# Patient Record
Sex: Female | Born: 1955 | Race: White | Hispanic: No | Smoking: Never smoker
Health system: Southern US, Community
[De-identification: ages and names within clinical notes are randomized; demographics above are authoritative.]

## PROBLEM LIST (undated history)

## (undated) ENCOUNTER — Emergency Department (HOSPITAL_COMMUNITY): Discharge status: Absent without leave | Payer: PPO

## (undated) DIAGNOSIS — I4891 Unspecified atrial fibrillation: Secondary | ICD-10-CM

## (undated) DIAGNOSIS — E039 Hypothyroidism, unspecified: Secondary | ICD-10-CM

## (undated) DIAGNOSIS — M1990 Unspecified osteoarthritis, unspecified site: Secondary | ICD-10-CM

## (undated) DIAGNOSIS — R609 Edema, unspecified: Secondary | ICD-10-CM

## (undated) DIAGNOSIS — F32A Depression, unspecified: Secondary | ICD-10-CM

## (undated) DIAGNOSIS — C73 Malignant neoplasm of thyroid gland: Secondary | ICD-10-CM

## (undated) DIAGNOSIS — K219 Gastro-esophageal reflux disease without esophagitis: Secondary | ICD-10-CM

## (undated) DIAGNOSIS — F419 Anxiety disorder, unspecified: Secondary | ICD-10-CM

## (undated) DIAGNOSIS — K7460 Unspecified cirrhosis of liver: Secondary | ICD-10-CM

## (undated) DIAGNOSIS — E119 Type 2 diabetes mellitus without complications: Secondary | ICD-10-CM

## (undated) DIAGNOSIS — I10 Essential (primary) hypertension: Secondary | ICD-10-CM

## (undated) DIAGNOSIS — J449 Chronic obstructive pulmonary disease, unspecified: Secondary | ICD-10-CM

## (undated) DIAGNOSIS — I2510 Atherosclerotic heart disease of native coronary artery without angina pectoris: Secondary | ICD-10-CM

## (undated) DIAGNOSIS — G4730 Sleep apnea, unspecified: Secondary | ICD-10-CM

## (undated) DIAGNOSIS — D649 Anemia, unspecified: Secondary | ICD-10-CM

## (undated) DIAGNOSIS — N3281 Overactive bladder: Secondary | ICD-10-CM

## (undated) DIAGNOSIS — F329 Major depressive disorder, single episode, unspecified: Secondary | ICD-10-CM

## (undated) HISTORY — DX: Major depressive disorder, single episode, unspecified: F32.9

## (undated) HISTORY — DX: Unspecified atrial fibrillation: I48.91

## (undated) HISTORY — DX: Unspecified cirrhosis of liver: K74.60

## (undated) HISTORY — DX: Essential (primary) hypertension: I10

## (undated) HISTORY — PX: FOOT SURGERY: SHX648

## (undated) HISTORY — DX: Depression, unspecified: F32.A

## (undated) HISTORY — DX: Malignant neoplasm of thyroid gland: C73

## (undated) HISTORY — DX: Morbid (severe) obesity due to excess calories: E66.01

## (undated) HISTORY — DX: Gastro-esophageal reflux disease without esophagitis: K21.9

## (undated) HISTORY — PX: OTHER SURGICAL HISTORY: SHX169

## (undated) HISTORY — DX: Edema, unspecified: R60.9

## (undated) HISTORY — DX: Type 2 diabetes mellitus without complications: E11.9

## (undated) HISTORY — DX: Chronic obstructive pulmonary disease, unspecified: J44.9

---
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
## 1983-06-21 HISTORY — PX: ABDOMINAL HYSTERECTOMY: SHX81

## 1994-06-20 HISTORY — PX: CHOLECYSTECTOMY: SHX55

## 1998-06-20 HISTORY — PX: FOOT SURGERY: SHX648

## 1999-03-01 ENCOUNTER — Ambulatory Visit (HOSPITAL_BASED_OUTPATIENT_CLINIC_OR_DEPARTMENT_OTHER): Admission: RE | Admit: 1999-03-01 | Discharge: 1999-03-01 | Payer: Self-pay | Admitting: Orthopedic Surgery

## 2000-02-07 ENCOUNTER — Encounter: Admission: RE | Admit: 2000-02-07 | Discharge: 2000-05-07 | Payer: Self-pay | Admitting: Orthopedic Surgery

## 2000-07-04 ENCOUNTER — Encounter: Admission: RE | Admit: 2000-07-04 | Discharge: 2000-10-02 | Payer: Self-pay | Admitting: Orthopedic Surgery

## 2000-07-14 ENCOUNTER — Observation Stay (HOSPITAL_COMMUNITY): Admission: EM | Admit: 2000-07-14 | Discharge: 2000-07-15 | Payer: Self-pay | Admitting: Emergency Medicine

## 2000-07-14 ENCOUNTER — Encounter: Payer: Self-pay | Admitting: Emergency Medicine

## 2000-09-26 ENCOUNTER — Emergency Department (HOSPITAL_COMMUNITY): Admission: EM | Admit: 2000-09-26 | Discharge: 2000-09-27 | Payer: Self-pay | Admitting: *Deleted

## 2000-09-26 ENCOUNTER — Encounter: Payer: Self-pay | Admitting: *Deleted

## 2000-09-27 ENCOUNTER — Encounter: Payer: Self-pay | Admitting: *Deleted

## 2000-09-29 ENCOUNTER — Inpatient Hospital Stay (HOSPITAL_COMMUNITY): Admission: EM | Admit: 2000-09-29 | Discharge: 2000-10-06 | Payer: Self-pay | Admitting: Internal Medicine

## 2000-10-02 ENCOUNTER — Encounter: Payer: Self-pay | Admitting: Internal Medicine

## 2000-10-02 ENCOUNTER — Encounter: Payer: Self-pay | Admitting: General Surgery

## 2000-10-18 ENCOUNTER — Encounter: Payer: Self-pay | Admitting: Family Medicine

## 2000-10-18 ENCOUNTER — Ambulatory Visit (HOSPITAL_COMMUNITY): Admission: RE | Admit: 2000-10-18 | Discharge: 2000-10-18 | Payer: Self-pay | Admitting: Family Medicine

## 2000-10-31 ENCOUNTER — Encounter: Admission: RE | Admit: 2000-10-31 | Discharge: 2001-01-29 | Payer: Self-pay | Admitting: Orthopedic Surgery

## 2000-12-20 ENCOUNTER — Ambulatory Visit (HOSPITAL_COMMUNITY): Admission: RE | Admit: 2000-12-20 | Discharge: 2000-12-20 | Payer: Self-pay | Admitting: Family Medicine

## 2000-12-20 ENCOUNTER — Encounter: Payer: Self-pay | Admitting: Family Medicine

## 2001-05-15 ENCOUNTER — Emergency Department (HOSPITAL_COMMUNITY): Admission: EM | Admit: 2001-05-15 | Discharge: 2001-05-15 | Payer: Self-pay | Admitting: Emergency Medicine

## 2001-06-20 HISTORY — PX: EXPLORATORY LAPAROTOMY: SUR591

## 2001-12-15 ENCOUNTER — Emergency Department (HOSPITAL_COMMUNITY): Admission: EM | Admit: 2001-12-15 | Discharge: 2001-12-15 | Payer: Self-pay | Admitting: Emergency Medicine

## 2002-03-06 ENCOUNTER — Ambulatory Visit: Admission: RE | Admit: 2002-03-06 | Discharge: 2002-03-06 | Payer: Self-pay | Admitting: Family Medicine

## 2002-03-08 ENCOUNTER — Ambulatory Visit (HOSPITAL_COMMUNITY): Admission: RE | Admit: 2002-03-08 | Discharge: 2002-03-08 | Payer: Self-pay | Admitting: Family Medicine

## 2002-03-08 ENCOUNTER — Encounter: Payer: Self-pay | Admitting: Family Medicine

## 2002-09-25 ENCOUNTER — Encounter: Payer: Self-pay | Admitting: Family Medicine

## 2002-09-25 ENCOUNTER — Ambulatory Visit (HOSPITAL_COMMUNITY): Admission: RE | Admit: 2002-09-25 | Discharge: 2002-09-25 | Payer: Self-pay | Admitting: Family Medicine

## 2002-12-14 ENCOUNTER — Emergency Department (HOSPITAL_COMMUNITY): Admission: EM | Admit: 2002-12-14 | Discharge: 2002-12-14 | Payer: Self-pay | Admitting: Emergency Medicine

## 2003-04-15 ENCOUNTER — Ambulatory Visit (HOSPITAL_COMMUNITY): Admission: RE | Admit: 2003-04-15 | Discharge: 2003-04-15 | Payer: Self-pay | Admitting: Family Medicine

## 2003-04-23 ENCOUNTER — Ambulatory Visit (HOSPITAL_COMMUNITY): Admission: RE | Admit: 2003-04-23 | Discharge: 2003-04-23 | Payer: Self-pay | Admitting: Internal Medicine

## 2003-05-01 ENCOUNTER — Other Ambulatory Visit: Admission: RE | Admit: 2003-05-01 | Discharge: 2003-05-01 | Payer: Self-pay | Admitting: General Surgery

## 2003-05-10 ENCOUNTER — Emergency Department (HOSPITAL_COMMUNITY): Admission: EM | Admit: 2003-05-10 | Discharge: 2003-05-10 | Payer: Self-pay | Admitting: Emergency Medicine

## 2004-04-12 ENCOUNTER — Ambulatory Visit (HOSPITAL_COMMUNITY): Admission: RE | Admit: 2004-04-12 | Discharge: 2004-04-12 | Payer: Self-pay | Admitting: Internal Medicine

## 2004-05-31 ENCOUNTER — Ambulatory Visit (HOSPITAL_COMMUNITY): Admission: RE | Admit: 2004-05-31 | Discharge: 2004-05-31 | Payer: Self-pay | Admitting: Internal Medicine

## 2004-07-07 ENCOUNTER — Ambulatory Visit: Payer: Self-pay | Admitting: Internal Medicine

## 2004-10-27 ENCOUNTER — Ambulatory Visit: Payer: Self-pay | Admitting: Internal Medicine

## 2004-12-08 ENCOUNTER — Ambulatory Visit (HOSPITAL_COMMUNITY): Admission: RE | Admit: 2004-12-08 | Discharge: 2004-12-08 | Payer: Self-pay | Admitting: Family Medicine

## 2005-03-19 ENCOUNTER — Emergency Department (HOSPITAL_COMMUNITY): Admission: EM | Admit: 2005-03-19 | Discharge: 2005-03-19 | Payer: Self-pay | Admitting: Emergency Medicine

## 2005-05-16 ENCOUNTER — Ambulatory Visit: Payer: Self-pay | Admitting: Internal Medicine

## 2005-10-27 ENCOUNTER — Emergency Department (HOSPITAL_COMMUNITY): Admission: EM | Admit: 2005-10-27 | Discharge: 2005-10-27 | Payer: Self-pay | Admitting: Emergency Medicine

## 2006-05-18 ENCOUNTER — Ambulatory Visit: Payer: Self-pay | Admitting: Internal Medicine

## 2006-06-14 ENCOUNTER — Encounter (INDEPENDENT_AMBULATORY_CARE_PROVIDER_SITE_OTHER): Payer: Self-pay | Admitting: Specialist

## 2006-06-14 ENCOUNTER — Ambulatory Visit (HOSPITAL_COMMUNITY): Admission: RE | Admit: 2006-06-14 | Discharge: 2006-06-14 | Payer: Self-pay | Admitting: Internal Medicine

## 2006-06-17 ENCOUNTER — Emergency Department (HOSPITAL_COMMUNITY): Admission: EM | Admit: 2006-06-17 | Discharge: 2006-06-17 | Payer: Self-pay | Admitting: Emergency Medicine

## 2006-07-14 ENCOUNTER — Ambulatory Visit: Payer: Self-pay | Admitting: Internal Medicine

## 2006-08-16 ENCOUNTER — Ambulatory Visit: Payer: Self-pay | Admitting: Internal Medicine

## 2006-08-21 ENCOUNTER — Ambulatory Visit: Payer: Self-pay | Admitting: Internal Medicine

## 2006-08-28 ENCOUNTER — Ambulatory Visit (HOSPITAL_COMMUNITY): Admission: RE | Admit: 2006-08-28 | Discharge: 2006-08-28 | Payer: Self-pay | Admitting: Internal Medicine

## 2006-08-28 ENCOUNTER — Ambulatory Visit: Payer: Self-pay | Admitting: Internal Medicine

## 2007-06-21 HISTORY — PX: THYROIDECTOMY: SHX17

## 2007-06-28 ENCOUNTER — Emergency Department (HOSPITAL_COMMUNITY): Admission: EM | Admit: 2007-06-28 | Discharge: 2007-06-28 | Payer: Self-pay | Admitting: Emergency Medicine

## 2007-07-12 ENCOUNTER — Ambulatory Visit (HOSPITAL_COMMUNITY): Admission: RE | Admit: 2007-07-12 | Discharge: 2007-07-12 | Payer: Self-pay | Admitting: Family Medicine

## 2007-07-18 ENCOUNTER — Ambulatory Visit: Payer: Self-pay | Admitting: Cardiovascular Disease

## 2007-07-23 ENCOUNTER — Ambulatory Visit (HOSPITAL_COMMUNITY): Admission: RE | Admit: 2007-07-23 | Discharge: 2007-07-23 | Payer: Self-pay | Admitting: Cardiovascular Disease

## 2007-07-23 ENCOUNTER — Ambulatory Visit: Payer: Self-pay | Admitting: Cardiology

## 2007-07-24 ENCOUNTER — Encounter (HOSPITAL_COMMUNITY): Admission: RE | Admit: 2007-07-24 | Discharge: 2007-08-23 | Payer: Self-pay | Admitting: Endocrinology

## 2007-08-28 ENCOUNTER — Ambulatory Visit: Payer: Self-pay

## 2007-09-07 ENCOUNTER — Ambulatory Visit (HOSPITAL_COMMUNITY): Admission: RE | Admit: 2007-09-07 | Discharge: 2007-09-07 | Payer: Self-pay | Admitting: Endocrinology

## 2007-12-03 ENCOUNTER — Inpatient Hospital Stay (HOSPITAL_COMMUNITY): Admission: RE | Admit: 2007-12-03 | Discharge: 2007-12-06 | Payer: Self-pay | Admitting: General Surgery

## 2007-12-03 ENCOUNTER — Encounter (INDEPENDENT_AMBULATORY_CARE_PROVIDER_SITE_OTHER): Payer: Self-pay | Admitting: General Surgery

## 2007-12-25 ENCOUNTER — Emergency Department (HOSPITAL_COMMUNITY): Admission: EM | Admit: 2007-12-25 | Discharge: 2007-12-25 | Payer: Self-pay | Admitting: Emergency Medicine

## 2008-02-26 ENCOUNTER — Ambulatory Visit: Payer: Self-pay | Admitting: Cardiovascular Disease

## 2008-03-12 ENCOUNTER — Ambulatory Visit: Payer: Self-pay

## 2008-03-13 ENCOUNTER — Ambulatory Visit: Payer: Self-pay

## 2008-03-19 ENCOUNTER — Ambulatory Visit (HOSPITAL_COMMUNITY): Admission: RE | Admit: 2008-03-19 | Discharge: 2008-03-19 | Payer: Self-pay | Admitting: Family Medicine

## 2008-04-22 ENCOUNTER — Emergency Department (HOSPITAL_COMMUNITY): Admission: EM | Admit: 2008-04-22 | Discharge: 2008-04-22 | Payer: Self-pay | Admitting: Emergency Medicine

## 2008-06-20 HISTORY — PX: UMBILICAL HERNIA REPAIR: SHX196

## 2008-06-24 ENCOUNTER — Inpatient Hospital Stay: Payer: Self-pay | Admitting: Endocrinology

## 2008-09-05 DIAGNOSIS — R609 Edema, unspecified: Secondary | ICD-10-CM

## 2008-09-05 DIAGNOSIS — E119 Type 2 diabetes mellitus without complications: Secondary | ICD-10-CM

## 2008-09-05 DIAGNOSIS — K219 Gastro-esophageal reflux disease without esophagitis: Secondary | ICD-10-CM

## 2008-09-08 ENCOUNTER — Ambulatory Visit: Payer: Self-pay | Admitting: Cardiovascular Disease

## 2008-12-07 ENCOUNTER — Emergency Department (HOSPITAL_COMMUNITY): Admission: EM | Admit: 2008-12-07 | Discharge: 2008-12-07 | Payer: Self-pay | Admitting: Emergency Medicine

## 2009-02-18 ENCOUNTER — Ambulatory Visit (HOSPITAL_COMMUNITY): Admission: RE | Admit: 2009-02-18 | Discharge: 2009-02-18 | Payer: Self-pay | Admitting: General Surgery

## 2009-03-02 ENCOUNTER — Ambulatory Visit (HOSPITAL_COMMUNITY): Admission: RE | Admit: 2009-03-02 | Discharge: 2009-03-02 | Payer: Self-pay | Admitting: General Surgery

## 2009-06-20 HISTORY — PX: OTHER SURGICAL HISTORY: SHX169

## 2009-07-15 ENCOUNTER — Encounter (INDEPENDENT_AMBULATORY_CARE_PROVIDER_SITE_OTHER): Payer: Self-pay | Admitting: *Deleted

## 2009-07-29 ENCOUNTER — Ambulatory Visit (HOSPITAL_COMMUNITY)
Admission: RE | Admit: 2009-07-29 | Discharge: 2009-07-29 | Payer: Self-pay | Source: Home / Self Care | Admitting: General Surgery

## 2009-08-19 ENCOUNTER — Ambulatory Visit (HOSPITAL_COMMUNITY): Admission: RE | Admit: 2009-08-19 | Discharge: 2009-08-19 | Payer: Self-pay | Admitting: General Surgery

## 2009-08-25 ENCOUNTER — Emergency Department (HOSPITAL_COMMUNITY): Admission: EM | Admit: 2009-08-25 | Discharge: 2009-08-26 | Payer: Self-pay | Admitting: Emergency Medicine

## 2009-09-28 ENCOUNTER — Ambulatory Visit: Payer: Self-pay | Admitting: Endocrinology

## 2009-11-10 ENCOUNTER — Inpatient Hospital Stay: Payer: Self-pay | Admitting: Endocrinology

## 2009-12-14 ENCOUNTER — Emergency Department (HOSPITAL_COMMUNITY): Admission: EM | Admit: 2009-12-14 | Discharge: 2009-12-14 | Payer: Self-pay | Admitting: Emergency Medicine

## 2009-12-31 ENCOUNTER — Emergency Department (HOSPITAL_COMMUNITY): Admission: EM | Admit: 2009-12-31 | Discharge: 2009-12-31 | Payer: Self-pay | Admitting: Emergency Medicine

## 2010-06-20 HISTORY — PX: CARDIAC CATHETERIZATION: SHX172

## 2010-07-20 NOTE — Letter (Signed)
Summary: Appointment - Reminder [place], [place]  [address] [place]   [place], [place] [address]   Phone: [phone]  Fax: [phone]     July 15, 2009 MRN: [id number]   [facility] [address] [place], [place]  [address]   Dear Ms. Carpino,  Our records indicate that it is time to schedule a follow-up appointment with Dr. Johnsie Cancel. It is very important that we reach you to schedule this appointment. We look forward to participating in your health care needs. Please contact us at the number listed above at your earliest convenience to schedule your appointment.  If you are unable to make an appointment at this time, give [place] a call so we can update our records.   Sincerely,   Darnell Level [place] Scheduling Team

## 2010-07-26 ENCOUNTER — Emergency Department (HOSPITAL_COMMUNITY): Payer: MEDICARE

## 2010-07-26 ENCOUNTER — Emergency Department (HOSPITAL_COMMUNITY)
Admission: EM | Admit: 2010-07-26 | Discharge: 2010-07-26 | Disposition: A | Discharge status: Home / Self Care | Payer: MEDICARE | Attending: Emergency Medicine | Admitting: Emergency Medicine

## 2010-07-26 DIAGNOSIS — F3289 Other specified depressive episodes: Secondary | ICD-10-CM | POA: Insufficient documentation

## 2010-07-26 DIAGNOSIS — Y92009 Unspecified place in unspecified non-institutional (private) residence as the place of occurrence of the external cause: Secondary | ICD-10-CM | POA: Insufficient documentation

## 2010-07-26 DIAGNOSIS — I10 Essential (primary) hypertension: Secondary | ICD-10-CM | POA: Insufficient documentation

## 2010-07-26 DIAGNOSIS — J45909 Unspecified asthma, uncomplicated: Secondary | ICD-10-CM | POA: Insufficient documentation

## 2010-07-26 DIAGNOSIS — M129 Arthropathy, unspecified: Secondary | ICD-10-CM | POA: Insufficient documentation

## 2010-07-26 DIAGNOSIS — K7460 Unspecified cirrhosis of liver: Secondary | ICD-10-CM | POA: Insufficient documentation

## 2010-07-26 DIAGNOSIS — S7000XA Contusion of unspecified hip, initial encounter: Secondary | ICD-10-CM | POA: Insufficient documentation

## 2010-07-26 DIAGNOSIS — Z79899 Other long term (current) drug therapy: Secondary | ICD-10-CM | POA: Insufficient documentation

## 2010-07-26 DIAGNOSIS — M25559 Pain in unspecified hip: Secondary | ICD-10-CM | POA: Insufficient documentation

## 2010-07-26 DIAGNOSIS — F329 Major depressive disorder, single episode, unspecified: Secondary | ICD-10-CM | POA: Insufficient documentation

## 2010-07-26 DIAGNOSIS — E119 Type 2 diabetes mellitus without complications: Secondary | ICD-10-CM | POA: Insufficient documentation

## 2010-07-26 DIAGNOSIS — E7800 Pure hypercholesterolemia, unspecified: Secondary | ICD-10-CM | POA: Insufficient documentation

## 2010-07-26 DIAGNOSIS — K219 Gastro-esophageal reflux disease without esophagitis: Secondary | ICD-10-CM | POA: Insufficient documentation

## 2010-07-26 DIAGNOSIS — W010XXA Fall on same level from slipping, tripping and stumbling without subsequent striking against object, initial encounter: Secondary | ICD-10-CM | POA: Insufficient documentation

## 2010-08-25 ENCOUNTER — Ambulatory Visit (HOSPITAL_COMMUNITY)
Admission: RE | Admit: 2010-08-25 | Discharge: 2010-08-25 | Disposition: A | Discharge status: Home / Self Care | Payer: MEDICARE | Source: Ambulatory Visit | Attending: Family Medicine | Admitting: Family Medicine

## 2010-08-25 ENCOUNTER — Other Ambulatory Visit (HOSPITAL_COMMUNITY): Payer: Self-pay | Admitting: Family Medicine

## 2010-08-25 ENCOUNTER — Encounter (HOSPITAL_COMMUNITY): Payer: Self-pay

## 2010-08-25 DIAGNOSIS — M542 Cervicalgia: Secondary | ICD-10-CM

## 2010-08-25 DIAGNOSIS — R079 Chest pain, unspecified: Secondary | ICD-10-CM | POA: Insufficient documentation

## 2010-09-05 LAB — DIFFERENTIAL
Basophils Absolute: 0 10*3/uL (ref 0.0–0.1)
Lymphocytes Relative: 22 % (ref 12–46)
Lymphs Abs: 1.1 10*3/uL (ref 0.7–4.0)
Monocytes Absolute: 0.3 10*3/uL (ref 0.1–1.0)
Monocytes Relative: 6 % (ref 3–12)
Neutro Abs: 3.4 10*3/uL (ref 1.7–7.7)

## 2010-09-05 LAB — BASIC METABOLIC PANEL
BUN: 10 mg/dL (ref 6–23)
CO2: 33 mEq/L — ABNORMAL HIGH (ref 19–32)
Calcium: 9.2 mg/dL (ref 8.4–10.5)
Chloride: 101 mEq/L (ref 96–112)
Creatinine, Ser: 0.6 mg/dL (ref 0.4–1.2)
GFR calc Af Amer: >60 mL/min (ref >60)
GFR calc non Af Amer: >60 mL/min (ref >60)
Glucose, Bld: 265 mg/dL — ABNORMAL HIGH (ref 70–99)
Potassium: 3.5 mEq/L (ref 3.5–5.1)
Sodium: 137 mEq/L (ref 135–145)

## 2010-09-05 LAB — CBC
HCT: 32.8 % — ABNORMAL LOW (ref 36.0–46.0)
Hemoglobin: 11.4 g/dL — ABNORMAL LOW (ref 12.0–15.0)
MCH: 31 pg (ref 26.0–34.0)
MCHC: 34.8 g/dL (ref 30.0–36.0)
MCV: 89.3 fL (ref 78.0–100.0)
Platelets: 105 10*3/uL — ABNORMAL LOW (ref 150–400)
RBC: 3.67 MIL/uL — ABNORMAL LOW (ref 3.87–5.11)
RDW: 15.4 % (ref 11.5–15.5)
WBC: 4.9 10*3/uL (ref 4.0–10.5)

## 2010-09-05 LAB — POCT CARDIAC MARKERS
CKMB, poc: <1 ng/mL — ABNORMAL LOW (ref 1.0–8.0)
Myoglobin, poc: 39 ng/mL (ref 12–200)

## 2010-09-10 LAB — DIFFERENTIAL
Basophils Relative: 1 % (ref 0–1)
Eosinophils Absolute: 0.3 10*3/uL (ref 0.0–0.7)
Eosinophils Relative: 3 % (ref 0–5)
Monocytes Absolute: 0.7 10*3/uL (ref 0.1–1.0)
Monocytes Relative: 8 % (ref 3–12)

## 2010-09-10 LAB — BASIC METABOLIC PANEL
CO2: 28 mEq/L (ref 19–32)
Calcium: 9.5 mg/dL (ref 8.4–10.5)
Chloride: 100 mEq/L (ref 96–112)
Creatinine, Ser: 0.77 mg/dL (ref 0.4–1.2)
GFR calc Af Amer: >60 mL/min (ref >60)
GFR calc non Af Amer: >60 mL/min (ref >60)
Glucose, Bld: 171 mg/dL — ABNORMAL HIGH (ref 70–99)
Sodium: 134 mEq/L — ABNORMAL LOW (ref 135–145)
Sodium: 136 mEq/L (ref 135–145)

## 2010-09-10 LAB — CBC
HCT: 31.6 % — ABNORMAL LOW (ref 36.0–46.0)
Hemoglobin: 11.6 g/dL — ABNORMAL LOW (ref 12.0–15.0)
MCHC: 36.9 g/dL — ABNORMAL HIGH (ref 30.0–36.0)
MCV: 84.1 fL (ref 78.0–100.0)
RBC: 3.76 MIL/uL — ABNORMAL LOW (ref 3.87–5.11)

## 2010-09-10 LAB — GLUCOSE, CAPILLARY: Glucose-Capillary: 142 mg/dL — ABNORMAL HIGH (ref 70–99)

## 2010-09-10 LAB — WOUND CULTURE

## 2010-09-24 LAB — BASIC METABOLIC PANEL
Chloride: 100 mEq/L (ref 96–112)
GFR calc Af Amer: >60 mL/min (ref >60)
GFR calc non Af Amer: >60 mL/min (ref >60)
Potassium: 3.6 mEq/L (ref 3.5–5.1)

## 2010-09-24 LAB — GLUCOSE, CAPILLARY
Glucose-Capillary: 116 mg/dL — ABNORMAL HIGH (ref 70–99)
Glucose-Capillary: 91 mg/dL (ref 70–99)

## 2010-09-24 LAB — CBC
HCT: 36.5 % (ref 36.0–46.0)
MCV: 86.9 fL (ref 78.0–100.0)
RBC: 4.2 MIL/uL (ref 3.87–5.11)
WBC: 6.4 10*3/uL (ref 4.0–10.5)

## 2010-09-30 ENCOUNTER — Encounter: Payer: Self-pay | Admitting: Cardiology

## 2010-09-30 ENCOUNTER — Ambulatory Visit (INDEPENDENT_AMBULATORY_CARE_PROVIDER_SITE_OTHER): Payer: MEDICARE | Admitting: Cardiology

## 2010-09-30 VITALS — BP 149/69 | HR 106 | Ht 61.0 in | Wt 350.0 lb

## 2010-09-30 DIAGNOSIS — I10 Essential (primary) hypertension: Secondary | ICD-10-CM

## 2010-09-30 DIAGNOSIS — E118 Type 2 diabetes mellitus with unspecified complications: Secondary | ICD-10-CM

## 2010-09-30 DIAGNOSIS — E1165 Type 2 diabetes mellitus with hyperglycemia: Secondary | ICD-10-CM

## 2010-09-30 DIAGNOSIS — R079 Chest pain, unspecified: Secondary | ICD-10-CM

## 2010-09-30 DIAGNOSIS — R072 Precordial pain: Secondary | ICD-10-CM | POA: Insufficient documentation

## 2010-09-30 NOTE — Assessment & Plan Note (Signed)
Patient states she was not able to undergo gastric bypass. Remains a significant limitation to her functional status, also contributor to her chronic comorbidities.

## 2010-09-30 NOTE — Patient Instructions (Signed)
Your physician recommends that you schedule a follow-up appointment in: we will contact you with results of test Your physician has requested that you have a lexiscan myoview. For further information please visit [email or web address]. Please follow instruction sheet, as given.

## 2010-09-30 NOTE — Assessment & Plan Note (Signed)
Followed by Dr. Hilma Favors.

## 2010-09-30 NOTE — Assessment & Plan Note (Signed)
Progressive and more intense, although generally atypical in description. Previous evaluations have been reassuring. Active cardiac risk factors persist including morbid obesity, hypertension, type 2.diabetes mellitus, and family history. Plan will be a two-day Lexicographer through our [place] office. Followup can be arranged based on test results.

## 2010-09-30 NOTE — Assessment & Plan Note (Signed)
Blood pressure up some today. Patient reports compliance with medications. This is being followed by Dr. Hilma Favors.

## 2010-09-30 NOTE — Progress Notes (Signed)
Clinical Summary [name] [name] is a 55 y.o.female referred as a new patient consultation, although has been followed by Dr. Johnsie Cancel, last seen in the [facility] office in March 2010. She reportedly had an adenosine Myoview done in September 2009 that demonstrated no ischemia with LVEF of 76%.  She has history of recurrent chest pain over the years, more frequent and intense within the last year. She has had no interval ischemic followup since her last Myoview. She tells me that she was close to having gastric bypass surgery back in 2010, however her insurance would not cover it, and she did not proceed. She remains morbidly obese, around 350 pounds.  She reports NYHA class 2-3 dyspnea on exertion. Chest pain symptoms are described as a "tightness" that occurs sporadically, sometimes with emotional upset, occasionally with exertion, although not exclusively. She also feels a "sharp" sensation in her chest with taking a deep breath at times. Some of the episodes are associated with diaphoresis.  She also reports "floaters" that are precipitated by caffeine use. No clear history of cardiac dysrhythmia.   Allergies  Allergen Reactions  . Biaxin   . Clindamycin/Lincomycin   . Penicillins     Current outpatient prescriptions:albuterol (PROVENTIL HFA) 108 (90 BASE) MCG/ACT inhaler, Inhale 2 puffs into the lungs every 6 (six) hours as needed.  , Disp: , Rfl: ;  ALPRAZolam (XANAX) 0.5 MG tablet, Take 0.5 mg by mouth 3 (three) times daily.  , Disp: , Rfl: ;  aspirin 81 MG EC tablet, Take 81 mg by mouth daily.  , Disp: , Rfl: ;  buPROPion (WELLBUTRIN SR) 150 MG 12 hr tablet, 150 mg 2 (two) times daily. , Disp: , Rfl:  calcium gluconate 500 MG tablet, Take 500 mg by mouth daily.  , Disp: , Rfl: ;  CELEBREX 200 MG capsule, 200 mg daily. , Disp: , Rfl: ;  colesevelam (WELCHOL) 625 MG tablet, Take 1,875 mg by mouth 2 (two) times daily with a meal. 3 tabs am 3 tabs pm , Disp: , Rfl: ;  DULoxetine (CYMBALTA) 60 MG  capsule, Take 60 mg by mouth daily.  , Disp: , Rfl: ;  furosemide (LASIX) 80 MG tablet, Take 80 mg by mouth daily.  , Disp: , Rfl:  glimepiride (AMARYL) 4 MG tablet, Take 4 mg by mouth daily. , Disp: , Rfl: ;  HYDROcodone-acetaminophen (VICODIN) 5-500 MG per tablet, Take 1 tablet by mouth as needed. , Disp: , Rfl: ;  levothyroxine (SYNTHROID, LEVOTHROID) 200 MCG tablet, Take 200 mcg by mouth daily.  , Disp: , Rfl: ;  lisinopril (PRINIVIL,ZESTRIL) 40 MG tablet, Take 40 mg by mouth daily. , Disp: , Rfl:  metFORMIN (GLUCOPHAGE) 500 MG tablet, Take 500 mg by mouth 2 (two) times daily. , Disp: , Rfl: ;  nystatin (NYSTOP) 100000 UNIT/GM POWD, , Disp: , Rfl: ;  simvastatin (ZOCOR) 20 MG tablet, Take 20 mg by mouth daily. , Disp: , Rfl: ;  vitamin D, CHOLECALCIFEROL, 400 UNITS tablet, Take 400 Units by mouth daily.  , Disp: , Rfl: ;  vitamin E (VITAMIN E) 400 UNIT capsule, Take 400 Units by mouth daily.  , Disp: , Rfl:  doxycycline (VIBRAMYCIN) 100 MG capsule, , Disp: , Rfl: ;  DISCONTD: benazepril-hydrochlorthiazide (LOTENSIN HCT) 20-12.5 MG per tablet, Take 1 tablet by mouth daily.  , Disp: , Rfl: ;  DISCONTD: cyclobenzaprine (FLEXERIL) 10 MG tablet, , Disp: , Rfl: ;  DISCONTD: fexofenadine (ALLEGRA) 180 MG tablet, Take 180 mg by mouth daily.  ,  Disp: , Rfl: ;  DISCONTD: lansoprazole (PREVACID) 30 MG capsule, Take 30 mg by mouth daily.  , Disp: , Rfl:  DISCONTD: methocarbamol (ROBAXIN) 500 MG tablet, , Disp: , Rfl: ;  DISCONTD: Multiple Vitamins-Minerals (MULTIVITAMIN WITH MINERALS) tablet, Take 1 tablet by mouth daily.  , Disp: , Rfl: ;  DISCONTD: oxyCODONE-acetaminophen (PERCOCET) 5-325 MG per tablet, , Disp: , Rfl: ;  DISCONTD: potassium chloride SA (K-DUR,KLOR-CON) 20 MEQ tablet, Take 20 mEq by mouth daily.  , Disp: , Rfl:  DISCONTD: rosiglitazone-metformin (AVANDAMET) 2-500 MG per tablet, Take 1 tablet by mouth daily.  , Disp: , Rfl:   Past Medical History  Diagnosis Date  . Type 2 diabetes mellitus   .  Essential hypertension, benign   . GERD (gastroesophageal reflux disease)   . Edema   . Morbid obesity   . COPD (chronic obstructive pulmonary disease)     Past Surgical History  Procedure Date  . Thyroidectomy 2009  . Cholecystectomy 1996  . Tonsillectomy 1958  . Foot surgery 2000  . Abdominal hysterectomy 1985  . Exploratory laparotomy 2003  . Umbilical hernia repair 1173  . Debridement of abdominal wound 2011    History reviewed. No pertinent family history.  Social History Ms. Everding reports that she has never smoked. She has never used smokeless tobacco. Ms. Dimercurio reports that she does not drink alcohol.  Review of Systems Also has problems with back pain, occasional lower activity edema. No syncope. Otherwise reviewed and negative except as outlined.  Physical Examination Filed Vitals:   09/30/10 1433  BP: 149/69  Pulse: 106  Morbidly obese woman in no acute distress. HEENT: Conjunctiva and lids normal, oropharynx with moist mucosa. Neck: Increased girth, no obvious elevated JVP or bruits, no thyromegaly. Lungs: Clear auscultation, nonlabored, no wheezing. Cardiac: Distant regular heart sounds, no loud systolic murmur or gallop. Abdomen: Morbidly obese, unable to palpate liver edge, bowel sounds present, no tenderness or guarding. Skin: Warm and dry. Musculoskeletal: No kyphosis. Extremities: No pitting edema, distal pulses one plus. Neuropsychiatric: Alert and oriented x3, affect appropriate.   ECG Sinus tachycardia at 103 beats per minute, poor anterior R-wave progression.   Problem List and Plan

## 2010-10-11 ENCOUNTER — Ambulatory Visit (HOSPITAL_COMMUNITY): Payer: Medicare Other | Attending: Cardiology | Admitting: Radiology

## 2010-10-11 VITALS — Ht 65.0 in | Wt 353.0 lb

## 2010-10-11 DIAGNOSIS — E119 Type 2 diabetes mellitus without complications: Secondary | ICD-10-CM

## 2010-10-11 DIAGNOSIS — R0602 Shortness of breath: Secondary | ICD-10-CM

## 2010-10-11 DIAGNOSIS — R079 Chest pain, unspecified: Secondary | ICD-10-CM | POA: Insufficient documentation

## 2010-10-11 DIAGNOSIS — R0789 Other chest pain: Secondary | ICD-10-CM

## 2010-10-11 MED ORDER — TECHNETIUM TC 99M TETROFOSMIN IV KIT
33.0000 | PACK | Freq: Once | INTRAVENOUS | Status: AC | PRN
  Administered 2010-10-11: 33 via INTRAVENOUS

## 2010-10-11 MED ORDER — REGADENOSON 0.4 MG/5ML IV SOLN
0.4000 mg | Freq: Once | INTRAVENOUS | Status: AC
  Administered 2010-10-11: 0.4 mg via INTRAVENOUS

## 2010-10-11 NOTE — Progress Notes (Signed)
[place] [address] [phone]  Cardiology Nuclear Med Study  [name] [name] is a 55 y.o. female [id number] 07/29/1955   Nuclear Med Background Indication for Stress Test:  Evaluation for Ischemia History:  Asthma, COPD and Myocardial Perfusion Study EF 76% (-) ischemia Cardiac Risk Factors: Hypertension, Lipids, NIDDM and Obesity  Symptoms:  Chest Pain, Chest Tightness, DOE, Fatigue, Palpitations and SOB   Nuclear Pre-Procedure Caffeine/Decaff Intake:  10:00pm NPO After: 10:00pm   Lungs: clear IV 0.9% NS with Angio Cath:  22g  IV Site: L Antecubital  IV Started by:  Irven Baltimore, RN  Chest Size (in):  54 Cup Size: B  Height: 5' 5"  (1.651 m)  Weight:  353 lb (160.12 kg)  BMI:  Body mass index is 58.74 kg/(m^2). Tech Comments:  n/a    Nuclear Med Study 1 or 2 day study: 2 day  Stress Test Type:  Lexiscan  Reading MD: Darlin Coco, MD  Order Authorizing Provider:  S.McDowell  Resting Radionuclide: Technetium 83mTetrofosmin  Resting Radionuclide Dose: 33 mCi   Stress Radionuclide:  Technetium 966metrofosmin  Stress Radionuclide Dose: 33 mCi           Stress Protocol Rest HR: 81 Stress HR: 91  Rest BP: 152/84 Stress BP: 172/76  Exercise Time (min): n/a METS: n/a   Predicted Max HR: 165 bpm % Max HR: 55.15 bpm Rate Pressure Product: 15652   Dose of Adenosine (mg):  n/a Dose of Lexiscan: 0.4 mg  Dose of Atropine (mg): n/a Dose of Dobutamine: n/a mcg/kg/min (at max HR)  Stress Test Technologist: SaPerrin MalteseEMT-P  Nuclear Technologist:  ToAnnye RuskCNMT     Rest Procedure:  Myocardial perfusion imaging was performed at rest 45 minutes following the intravenous administration of Technetium 9971mtrofosmin. Rest ECG: NSR  Stress Procedure:  The patient received IV Lexiscan 0.4 mg over 15-seconds.  Technetium 90m27mrofosmin injected at 30-seconds.  There were no significant changes with  Lexiscan.  Quantitative spect images were obtained after a 45 minute delay. Stress ECG: No significant ST segment change suggestive of ischemia.  QPS Raw Data Images:  There is a breast shadow that accounts for the anterior attenuation on the stress images. Stress Images:  There is decreased uptake in the anterior wall. Rest Images:  Normal homogeneous uptake in all areas of the myocardium. Subtraction (SDS):  There is a reversible anterior defect which is likely shifting breast attenuation but cannot exclude ischemia. Transient Ischemic Dilatation (Normal <1.22): 1.15 Lung/Heart Ratio (Normal <0.45):  .22   Quantitative Gated Spect Images QGS EDV:  97 ml QGS ESV:  32 ml QGS cine images:  NL LV Function; NL Wall Motion QGS EF: 67%  Impression Exercise Capacity:  Lexiscan with no exercise. BP Response:  n/a Clinical Symptoms:  n/a ECG Impression:  No significant ECG changes with Lexiscan. Comparison with Prior Nuclear Study: No images to compare  Overall Impression:  Probably normal stress nuclear study. There is a reversible anterior defect which is likely shifting breast attenuation but cannot exclude ischemia.      [name] [name]

## 2010-10-12 ENCOUNTER — Ambulatory Visit (HOSPITAL_COMMUNITY): Payer: Medicare Other | Attending: Cardiology | Admitting: Radiology

## 2010-10-12 DIAGNOSIS — R0989 Other specified symptoms and signs involving the circulatory and respiratory systems: Secondary | ICD-10-CM

## 2010-10-12 MED ORDER — TECHNETIUM TC 99M TETROFOSMIN IV KIT
33.0000 | PACK | Freq: Once | INTRAVENOUS | Status: AC | PRN
  Administered 2010-10-12: 33 via INTRAVENOUS

## 2010-10-13 ENCOUNTER — Telehealth: Payer: Self-pay | Admitting: Cardiology

## 2010-10-13 NOTE — Progress Notes (Signed)
Report reviewed. Anterior defect suggestive of shifting soft tissue attenuation, although ischemia cannot be completely excluded. I would like to see her back in the office to discuss the results and determine whether we need to proceed with any additional testing, versus observation.

## 2010-10-13 NOTE — Progress Notes (Signed)
ROUTED TO DR.MCDOWELL.Parks Neptune

## 2010-10-13 NOTE — Telephone Encounter (Signed)
PT WAS TOLD BY STRESS LAB THAT HER RESULTS FROM YESTERDAY WOULD BE READY TODAY. I INFORMED HER THAT WE HAVE NOT GOT THEM YET AND WE WOULD CALL WHEN WE DID.

## 2010-10-14 ENCOUNTER — Telehealth: Payer: Self-pay

## 2010-10-18 ENCOUNTER — Ambulatory Visit (INDEPENDENT_AMBULATORY_CARE_PROVIDER_SITE_OTHER): Payer: Medicare Other | Admitting: Adult Health

## 2010-10-18 ENCOUNTER — Encounter: Payer: Self-pay | Admitting: Adult Health

## 2010-10-18 DIAGNOSIS — E785 Hyperlipidemia, unspecified: Secondary | ICD-10-CM

## 2010-10-18 DIAGNOSIS — R079 Chest pain, unspecified: Secondary | ICD-10-CM

## 2010-10-18 DIAGNOSIS — Z7901 Long term (current) use of anticoagulants: Secondary | ICD-10-CM

## 2010-10-18 DIAGNOSIS — I2510 Atherosclerotic heart disease of native coronary artery without angina pectoris: Secondary | ICD-10-CM

## 2010-10-18 DIAGNOSIS — I10 Essential (primary) hypertension: Secondary | ICD-10-CM

## 2010-10-18 DIAGNOSIS — R0989 Other specified symptoms and signs involving the circulatory and respiratory systems: Secondary | ICD-10-CM

## 2010-10-18 MED ORDER — LISINOPRIL-HYDROCHLOROTHIAZIDE 20-12.5 MG PO TABS: 1.0000 | ORAL_TABLET | Freq: Every day | ORAL | Status: DC

## 2010-10-18 MED ORDER — DILTIAZEM HCL 120 MG PO TABS: 120.0000 mg | ORAL_TABLET | Freq: Every day | ORAL | Status: DC

## 2010-10-18 NOTE — Patient Instructions (Addendum)
Your physician has requested that you have a cardiac catheterization. Cardiac catheterization is used to diagnose and/or treat various heart conditions. Doctors may recommend this procedure for a number of different reasons. The most common reason is to evaluate chest pain. Chest pain can be a symptom of coronary artery disease (CAD), and cardiac catheterization can show whether plaque is narrowing or blocking your heart's arteries. This procedure is also used to evaluate the valves, as well as measure the blood flow and oxygen levels in different parts of your heart. For further information please visit [email or web address]. Please follow instruction sheet, as given.   Your physician has recommended you make the following change in your medication: start taking Cardizem 196m daily   Your physician recommends that you return for lab work in: today   Your physician recommends that you schedule a follow-up appointment in: 1 month

## 2010-10-18 NOTE — Assessment & Plan Note (Signed)
2-Day stress myoview completed and read by Dr. Domenic Polite demonstrated "probably normal stress test.  There is a reversible anterior defect which is likely shifting breast attenuation, but cannot exclude ischemia.    She continues to have chest pain and pressure. I have discussed medical management vs proceeding with cardiac catheterization. She would like to proceed with cath for definitive evaluation of coronary anatomy.  She is anxious to know for sure if she has heart disease.  She does have multiple risks factors.  I have called Dr. Domenic Polite in [place] and he is in agreement with proceeding with cardiac cath. This will be planned for Friday, May 4th in the OP cath lab with Dr. Lia Foyer.  Discussion of cath, risks and benefits have been completed. She will hold metformin the day before and day of cath. She will hold lasix the day of cath. She verbalizes understanding.  She will require RADIAL INSERTION SECONDARY TO MORBID OBESITY.  She request significant sedation because of difficulty relaxing on prior IV conscious sedation procedures.

## 2010-10-18 NOTE — Assessment & Plan Note (Signed)
She remains hypertensive and tachycardic.  I will start her on cardiazem 161m daily for HR and BP control. Will not start BB with history of COPD, Asthma.  Although I do not hear wheezes, I do not want to confuse the issue with addition of BB.  We will make other recommendations after catheterization is completed.

## 2010-10-18 NOTE — Progress Notes (Signed)
Clinical Summary [name] [name] is a 55 y.o.female referred as a new patient consultation on 4/12//2012 and seen by Dr. Domenic Polite, although has been followed by Dr. Johnsie Cancel, last seen in the [facility] office in March 2010. She reportedly had an adenosine Myoview done in September 2009 that demonstrated no ischemia with LVEF of 76%. She is morbidly obese, but has not been able to have gastric bypass secondary to insurance restrictions she is 350lbs.  She has a history of COPD, Asthma, hypertension, NIDDM, sleep apnea, (but cannot tolerate CPAP) and hyperlipidemia.    She has history of recurrent chest pain over the years, more frequent and intense within the last year. She has had no interval ischemic followup since her last Myoview. She reports NYHA class 2-3 dyspnea on exertion. Chest pain symptoms are described as a "tightness" that occurs sporadically, sometimes with emotional upset, occasionally with exertion, although not exclusively. She also feels a "sharp" sensation in her chest with taking a deep breath at times. Some of the episodes are associated with diaphoresis. On last visit she was scheduled for a 2-day stress myoview. She continues to have recurrent chest pain, DOE and chest pressure at rest.    Allergies  Allergen Reactions  . Biaxin   . Clindamycin/Lincomycin   . Penicillins   . Neosporin (Neomycin-Polymyxin B) Rash    Eye gtts    Current outpatient prescriptions:albuterol (PROVENTIL HFA) 108 (90 BASE) MCG/ACT inhaler, Inhale 2 puffs into the lungs every 6 (six) hours as needed.  , Disp: , Rfl: ;  ALPRAZolam (XANAX) 0.5 MG tablet, Take 0.5 mg by mouth 3 (three) times daily.  , Disp: , Rfl: ;  aspirin 81 MG EC tablet, Take 81 mg by mouth daily.  , Disp: , Rfl: ;  buPROPion (WELLBUTRIN SR) 150 MG 12 hr tablet, 150 mg 2 (two) times daily. , Disp: , Rfl:  calcium gluconate 500 MG tablet, Take 500 mg by mouth daily.  , Disp: , Rfl: ;  CELEBREX 200 MG capsule, 200 mg daily. , Disp: , Rfl:  ;  colesevelam (WELCHOL) 625 MG tablet, Take 1,875 mg by mouth 2 (two) times daily with a meal. 3 tabs am 3 tabs pm , Disp: , Rfl: ;  DULoxetine (CYMBALTA) 60 MG capsule, Take 60 mg by mouth daily.  , Disp: , Rfl: ;  furosemide (LASIX) 80 MG tablet, Take 80 mg by mouth daily.  , Disp: , Rfl:  glimepiride (AMARYL) 4 MG tablet, Take 4 mg by mouth daily. , Disp: , Rfl: ;  HYDROcodone-acetaminophen (VICODIN) 5-500 MG per tablet, Take 1 tablet by mouth as needed. , Disp: , Rfl: ;  levothyroxine (SYNTHROID, LEVOTHROID) 200 MCG tablet, Take 200 mcg by mouth daily.  , Disp: , Rfl: ;  metFORMIN (GLUCOPHAGE) 500 MG tablet, Take 500 mg by mouth 2 (two) times daily. , Disp: , Rfl: ;  nystatin (NYSTOP) 100000 UNIT/GM POWD, , Disp: , Rfl:  simvastatin (ZOCOR) 20 MG tablet, Take 20 mg by mouth daily. , Disp: , Rfl: ;  vitamin D, CHOLECALCIFEROL, 400 UNITS tablet, Take 400 Units by mouth daily.  , Disp: , Rfl: ;  vitamin E (VITAMIN E) 400 UNIT capsule, Take 400 Units by mouth daily.  , Disp: , Rfl: ;  DISCONTD: lisinopril (PRINIVIL,ZESTRIL) 40 MG tablet, Take 40 mg by mouth daily. , Disp: , Rfl:  lisinopril-hydrochlorothiazide (PRINZIDE,ZESTORETIC) 20-12.5 MG per tablet, Take 1 tablet by mouth daily., Disp: 30 tablet, Rfl: 3;  DISCONTD: doxycycline (VIBRAMYCIN) 100 MG capsule, ,  Disp: , Rfl:   Past Medical History  Diagnosis Date  . Type 2 diabetes mellitus   . Essential hypertension, benign   . GERD (gastroesophageal reflux disease)   . Edema   . Morbid obesity   . COPD (chronic obstructive pulmonary disease)     Past Surgical History  Procedure Date  . Thyroidectomy 2009  . Cholecystectomy 1996  . Tonsillectomy 1958  . Foot surgery 2000  . Abdominal hysterectomy 1985  . Exploratory laparotomy 2003  . Umbilical hernia repair 0938  . Debridement of abdominal wound 2011   FH: Hypertension.  CAD in both grandparents on both sdu Social History [name] [name] reports that she has never smoked. She has never  used smokeless tobacco. [name] [name] reports that she does not drink alcohol.  Review of Systems Also has problems with back pain, occasional lower activity edema. No syncope. Otherwise reviewed and negative except as outlined.  Physical Examination Filed Vitals:   10/18/10 1321  BP: 173/83  Pulse: 100  Morbidly obese woman in no acute distress. HEENT: Conjunctiva and lids normal, oropharynx with moist mucosa. Neck: Increased girth, no obvious elevated JVP or bruits, no thyromegaly. Lungs: Clear auscultation, nonlabored, no wheezing. Cardiac: Distant regular heart sounds, no loud systolic murmur or gallop. Abdomen: Morbidly obese, unable to palpate liver edge, bowel sounds present, no tenderness or guarding. Skin: Warm and dry. Musculoskeletal: No kyphosis. Extremities: No pitting edema, distal pulses one plus. Neuropsychiatric: Alert and oriented x3, affect appropriate.   ECG Sinus tachycardia at 103 beats per minute, poor anterior R-wave progression.   ASSESSMENT AND PLAN

## 2010-10-19 ENCOUNTER — Encounter: Payer: Self-pay | Admitting: Cardiology

## 2010-10-19 ENCOUNTER — Telehealth: Payer: Self-pay | Admitting: Adult Health

## 2010-10-19 LAB — BASIC METABOLIC PANEL
Calcium: 9.2 mg/dL (ref 8.4–10.5)
Glucose, Bld: 168 mg/dL — ABNORMAL HIGH (ref 70–99)
Potassium: 3.5 mEq/L (ref 3.5–5.3)
Sodium: 136 mEq/L (ref 135–145)

## 2010-10-19 LAB — CBC WITH DIFFERENTIAL/PLATELET
Basophils Absolute: 0 10*3/uL (ref 0.0–0.1)
HCT: 41.1 % (ref 36.0–46.0)
Lymphocytes Relative: 27 % (ref 12–46)
Lymphs Abs: 2.5 10*3/uL (ref 0.7–4.0)
Monocytes Absolute: 0.4 10*3/uL (ref 0.1–1.0)
Neutro Abs: 5.9 10*3/uL (ref 1.7–7.7)
RBC: 4.48 MIL/uL (ref 3.87–5.11)
RDW: 15.1 % (ref 11.5–15.5)
WBC: 9 10*3/uL (ref 4.0–10.5)

## 2010-10-19 LAB — PROTIME-INR
INR: 0.86 (ref <1.50)
Prothrombin Time: 11.9 seconds (ref 11.6–15.2)

## 2010-10-19 MED ORDER — LISINOPRIL 40 MG PO TABS: 40.0000 mg | ORAL_TABLET | Freq: Every day | ORAL | Status: DC

## 2010-10-19 NOTE — Telephone Encounter (Signed)
PT WAS SEEN YESTERDAY BY KATHYRN LAWRENCE AND GIVEN TWO NEW RX SHE HAS QUESTIONS ABOUT CURRANT MEDICATIONS.

## 2010-10-22 ENCOUNTER — Inpatient Hospital Stay (HOSPITAL_BASED_OUTPATIENT_CLINIC_OR_DEPARTMENT_OTHER)
Admission: RE | Admit: 2010-10-22 | Discharge: 2010-10-22 | Disposition: A | Discharge status: Home / Self Care | Payer: Medicare Other | Source: Ambulatory Visit | Attending: Cardiology | Admitting: Cardiology

## 2010-10-22 DIAGNOSIS — R079 Chest pain, unspecified: Secondary | ICD-10-CM

## 2010-10-22 DIAGNOSIS — G4733 Obstructive sleep apnea (adult) (pediatric): Secondary | ICD-10-CM | POA: Insufficient documentation

## 2010-10-22 DIAGNOSIS — R9439 Abnormal result of other cardiovascular function study: Secondary | ICD-10-CM | POA: Insufficient documentation

## 2010-10-22 DIAGNOSIS — R0602 Shortness of breath: Secondary | ICD-10-CM | POA: Insufficient documentation

## 2010-10-22 LAB — POCT I-STAT GLUCOSE: Operator id: 194801

## 2010-11-01 ENCOUNTER — Ambulatory Visit (INDEPENDENT_AMBULATORY_CARE_PROVIDER_SITE_OTHER): Payer: Medicare Other | Admitting: Adult Health

## 2010-11-01 ENCOUNTER — Encounter: Payer: Self-pay | Admitting: Adult Health

## 2010-11-01 DIAGNOSIS — R079 Chest pain, unspecified: Secondary | ICD-10-CM

## 2010-11-01 DIAGNOSIS — I10 Essential (primary) hypertension: Secondary | ICD-10-CM

## 2010-11-01 MED ORDER — DILTIAZEM HCL ER COATED BEADS 240 MG PO CP24: 240.0000 mg | ORAL_CAPSULE | Freq: Every day | ORAL | Status: DC

## 2010-11-01 NOTE — Assessment & Plan Note (Signed)
Review of cath report demonstrates no obstructive CAD, well preserved systolic fx. No evidence of high grade focal obstruction per Dr. Lia Foyer.  This was discussed with the patient. She now states that knowing this result that she feels this is related to depression and to grief as she lost her only son to death a few years back. She has still not gotten over it.  She will continue her current medications.

## 2010-11-01 NOTE — Patient Instructions (Signed)
Your physician has recommended you make the following change in your medication: increase Diltiazem to 218m daily.  Your physician recommends that you schedule a follow-up appointment in: 1 week for blood pressure check with nurse and in 1 year with provider

## 2010-11-01 NOTE — Assessment & Plan Note (Signed)
Blood pressure control is not optimal.  I will increase diltiazem to 240 mg daily from 167m daily.  She will see nurses in 1-2 weeks for BP check.

## 2010-11-01 NOTE — Progress Notes (Signed)
HPI  Allergies  Allergen Reactions  . Biaxin   . Clindamycin/Lincomycin   . Penicillins   . Neosporin (Neomycin-Polymyxin B) Rash    Eye gtts    Current Outpatient Prescriptions  Medication Sig Dispense Refill  . albuterol (PROVENTIL HFA) 108 (90 BASE) MCG/ACT inhaler Inhale 2 puffs into the lungs every 6 (six) hours as needed.        . ALPRAZolam (XANAX) 0.5 MG tablet Take 0.5 mg by mouth 3 (three) times daily.        [name] aspirin 81 MG EC tablet Take 81 mg by mouth daily.        [name] buPROPion (WELLBUTRIN SR) 150 MG 12 hr tablet 150 mg 2 (two) times daily.       . calcium gluconate 500 MG tablet Take 500 mg by mouth daily.        . CELEBREX 200 MG capsule 200 mg daily.       . colesevelam (WELCHOL) 625 MG tablet Take 1,875 mg by mouth 2 (two) times daily with a meal. 3 tabs am 3 tabs pm       . DULoxetine (CYMBALTA) 60 MG capsule Take 60 mg by mouth 2 (two) times daily.       . furosemide (LASIX) 80 MG tablet Take 80 mg by mouth daily.        [name] glimepiride (AMARYL) 4 MG tablet Take 4 mg by mouth daily.       [name] HYDROcodone-acetaminophen (VICODIN) 5-500 MG per tablet Take 1 tablet by mouth as needed.       [name] levothyroxine (SYNTHROID, LEVOTHROID) 300 MCG tablet Take 300 mcg by mouth daily.        [name] lisinopril (PRINIVIL,ZESTRIL) 40 MG tablet Take 1 tablet (40 mg total) by mouth daily.  30 tablet  3  . lisinopril-hydrochlorothiazide (PRINZIDE,ZESTORETIC) 20-12.5 MG per tablet Take 2 tablets by mouth daily.       . metFORMIN (GLUCOPHAGE) 500 MG tablet Take 500 mg by mouth 2 (two) times daily.       [name] nystatin (NYSTOP) 100000 UNIT/GM POWD       . simvastatin (ZOCOR) 20 MG tablet Take 20 mg by mouth daily.       . vitamin D, CHOLECALCIFEROL, 400 UNITS tablet Take 400 Units by mouth daily.        . vitamin E (VITAMIN E) 400 UNIT capsule Take 400 Units by mouth daily.        [name] DISCONTD: diltiazem (CARDIZEM) 120 MG tablet Take 1 tablet (120 mg total) by mouth daily.  30 tablet  3  . DISCONTD:  levothyroxine (SYNTHROID, LEVOTHROID) 200 MCG tablet Take 200 mcg by mouth daily.         Past Medical History  Diagnosis Date  . Type 2 diabetes mellitus   . Essential hypertension, benign   . GERD (gastroesophageal reflux disease)   . Edema   . Morbid obesity   . COPD (chronic obstructive pulmonary disease)     Past Surgical History  Procedure Date  . Thyroidectomy 2009  . Cholecystectomy 1996  . Tonsillectomy 1958  . Foot surgery 2000  . Abdominal hysterectomy 1985  . Exploratory laparotomy 2003  . Umbilical hernia repair 7035  . Debridement of abdominal wound 2011    ROS: PHYSICAL EXAM BP 167/76  Pulse 93  Ht 5' 5"  (1.651 m)  Wt 354 lb (160.573 kg)  BMI 58.91 kg/m2  SpO2 97%  EKG:  ASSESSMENT AND PLAN Clinical Summary Ms.  Minella is a 55 y.o.female referred as a new patient consultation on 4/12//2012 and seen by Dr. Domenic Polite, although has been followed by Dr. Johnsie Cancel, last seen in the [facility] office in March 2010. She reportedly had an adenosine Myoview done in September 2009 that demonstrated no ischemia with LVEF of 76%. She is morbidly obese, but has not been able to have gastric bypass secondary to insurance restrictions she is 350lbs.  She has a history of COPD, Asthma, hypertension, NIDDM, sleep apnea, (but cannot tolerate CPAP) and hyperlipidemia.    She has history of recurrent chest pain over the years, more frequent and intense within the last year. She has had no interval ischemic followup since her last Myoview. She reports NYHA class 2-3 dyspnea on exertion. Chest pain symptoms are described as a "tightness" that occurs sporadically, sometimes with emotional upset, occasionally with exertion, although not exclusively. She also feels a "sharp" sensation in her chest with taking a deep breath at times. Some of the episodes are associated with diaphoresis. Stress test was equavical and she wanted to proceed with catheterization.She is here for discussion of   results.    Allergies  Allergen Reactions  . Biaxin   . Clindamycin/Lincomycin   . Penicillins   . Neosporin (Neomycin-Polymyxin B) Rash    Eye gtts    Current outpatient prescriptions:albuterol (PROVENTIL HFA) 108 (90 BASE) MCG/ACT inhaler, Inhale 2 puffs into the lungs every 6 (six) hours as needed.  , Disp: , Rfl: ;  ALPRAZolam (XANAX) 0.5 MG tablet, Take 0.5 mg by mouth 3 (three) times daily.  , Disp: , Rfl: ;  aspirin 81 MG EC tablet, Take 81 mg by mouth daily.  , Disp: , Rfl: ;  buPROPion (WELLBUTRIN SR) 150 MG 12 hr tablet, 150 mg 2 (two) times daily. , Disp: , Rfl:  calcium gluconate 500 MG tablet, Take 500 mg by mouth daily.  , Disp: , Rfl: ;  CELEBREX 200 MG capsule, 200 mg daily. , Disp: , Rfl: ;  colesevelam (WELCHOL) 625 MG tablet, Take 1,875 mg by mouth 2 (two) times daily with a meal. 3 tabs am 3 tabs pm , Disp: , Rfl: ;  DULoxetine (CYMBALTA) 60 MG capsule, Take 60 mg by mouth 2 (two) times daily. , Disp: , Rfl: ;  furosemide (LASIX) 80 MG tablet, Take 80 mg by mouth daily.  , Disp: , Rfl:  glimepiride (AMARYL) 4 MG tablet, Take 4 mg by mouth daily. , Disp: , Rfl: ;  HYDROcodone-acetaminophen (VICODIN) 5-500 MG per tablet, Take 1 tablet by mouth as needed. , Disp: , Rfl: ;  levothyroxine (SYNTHROID, LEVOTHROID) 300 MCG tablet, Take 300 mcg by mouth daily.  , Disp: , Rfl: ;  lisinopril (PRINIVIL,ZESTRIL) 40 MG tablet, Take 1 tablet (40 mg total) by mouth daily., Disp: 30 tablet, Rfl: 3 lisinopril-hydrochlorothiazide (PRINZIDE,ZESTORETIC) 20-12.5 MG per tablet, Take 2 tablets by mouth daily. , Disp: , Rfl: ;  metFORMIN (GLUCOPHAGE) 500 MG tablet, Take 500 mg by mouth 2 (two) times daily. , Disp: , Rfl: ;  nystatin (NYSTOP) 100000 UNIT/GM POWD, , Disp: , Rfl: ;  simvastatin (ZOCOR) 20 MG tablet, Take 20 mg by mouth daily. , Disp: , Rfl: ;  vitamin D, CHOLECALCIFEROL, 400 UNITS tablet, Take 400 Units by mouth daily.  , Disp: , Rfl:  vitamin E (VITAMIN E) 400 UNIT capsule, Take 400 Units  by mouth daily.  , Disp: , Rfl: ;  DISCONTD: diltiazem (CARDIZEM) 120 MG tablet, Take 1 tablet (120  mg total) by mouth daily., Disp: 30 tablet, Rfl: 3;  DISCONTD: levothyroxine (SYNTHROID, LEVOTHROID) 200 MCG tablet, Take 200 mcg by mouth daily. , Disp: , Rfl:   Past Medical History  Diagnosis Date  . Type 2 diabetes mellitus   . Essential hypertension, benign   . GERD (gastroesophageal reflux disease)   . Edema   . Morbid obesity   . COPD (chronic obstructive pulmonary disease)     Past Surgical History  Procedure Date  . Thyroidectomy 2009  . Cholecystectomy 1996  . Tonsillectomy 1958  . Foot surgery 2000  . Abdominal hysterectomy 1985  . Exploratory laparotomy 2003  . Umbilical hernia repair 7867  . Debridement of abdominal wound 2011   FH: Hypertension.  CAD in both grandparents on both sdu Social History Ms. Imran reports that she has never smoked. She has never used smokeless tobacco. Ms. Sweatt reports that she does not drink alcohol.  Review of Systems Also has problems with back pain, occasional lower activity edema. No syncope. Otherwise reviewed and negative except as outlined.  Physical Examination Filed Vitals:   11/01/10 1249  BP: 167/76  Pulse: 93  Morbidly obese woman in no acute distress. HEENT: Conjunctiva and lids normal, oropharynx with moist mucosa. Neck: Increased girth, no obvious elevated JVP or bruits, no thyromegaly. Lungs: Clear auscultation, nonlabored, no wheezing. Cardiac: Distant regular heart sounds, no loud systolic murmur or gallop. Abdomen: Morbidly obese, unable to palpate liver edge, bowel sounds present, no tenderness or guarding. Skin: Warm and dry. Musculoskeletal: No kyphosis. Extremities: No pitting edema, distal pulses one plus. Neuropsychiatric: Alert and oriented x3, Tearful  ECG Sinus tachycardia at 103 beats per minute, poor anterior R-wave progression.   ASSESSMENT AND PLAN

## 2010-11-02 NOTE — Assessment & Plan Note (Signed)
[place] OFFICE NOTE   NAME:[name] [name], [name] [name]                      MRN:          [id number]  DATE:08/28/2007                            DOB:          05/01/1956    [name] [name] returns today for followup.  She has had dyspnea and lower  extremity edema.  The patient had a 2-D echocardiogram which was normal.  EF was greater than 60%.  There is no evidence of pulmonary  hypertension.  Her IVC was normal.   The patient is morbidly obese.  I had a frank discussion with [name] [name] today  regarding this.  There is no surprised that she is short of breath and  has lower extremity edema.  She weighs over 350 pounds.   Her Lasix has been increased to 80 b.i.d. with potassium 20 b.i.d.   She continues to eat poorly.  She has biscuits for breakfast and eats  chocolate ice cream all the time.   MEDICATIONS:  Welchol 625 b.i.d., Avandamet 2/500 b.i.d., Cymbalta 60 a  day, Klor-Con 20 a day, Benicar 40/12.5,  Lasix 80 b.i.d., fexofenadine  180 a day, Prevacid 30 a day, multivitamins and Armour thyroid 120 mg a  day.   REVIEW OF SYSTEMS:  Remarkable for continued thyroid problems.  It  appears that she has been switched Armour Thyroid.  I do not have a  recent TSH on her.   She denies any significant palpitations or chest pain.   Her exam is remarkable for morbidly obese white female in no distress.  Her weight is 354, blood pressure is 140/80, pulse 89 regular, afebrile,  respiratory rate 14.  HEENT:  Unremarkable.  Carotids normal, without bruit, no  lymphadenopathy, thyromegaly or JVP elevation.  LUNGS:  Clear, good diaphragmatic motion.  No wheezing.  CARDIAC:  S1-S2 with distant heart sounds, PMI normal.  ABDOMEN:  Protuberant.  Bowel sounds positive.  No AAA, no tenderness,  no hepatosplenomegaly, hepatojugular reflux.  EXTREMITIES:  Distal pulses are intact with +1 edema bilaterally.  NEURO:  Nonfocal.  SKIN:   Warm and dry.  MUSCULOSKELETAL:  No muscular weakness.   IMPRESSION:  1. Dyspnea related to morbid obesity.  No evidence of underlying      decreased LV function or chronic lung disease.  2. Lower extremity edema, dependent, secondary to morbid obesity.      Continue Lasix b.i.d.  3. Thyroid abnormalities with 2 hot nodules and goiter, may need      surgery.  Continue to follow up with Dr. Ronnald [name] regarding Armour      thyroid replacement to follow TSH goal of less than three.  4. Morbid obesity.  She has information regarding bariatric surgery.      I encouraged her to follow through with this.  5. Diabetes.  Continue Avandamet.  Likely will be on Lantus insulin if      her weight continues to be high.     [name] [name]. [name] Cancel, MD, [facility]  Electronically Signed    PCN/MedQ  DD: 08/28/2007  DT: 08/28/2007  Job #: [id number]  cc:   [name] [name], M.D.

## 2010-11-02 NOTE — H&P (Signed)
NAMEDONNI, [name] [name]               ACCOUNT NO.:  [id number]   MEDICAL RECORD NO.:  [id number]          PATIENT TYPE:  AMB   LOCATION:  DAY                           FACILITY:  [facility]   PHYSICIAN:  [name], M.D.  DATE OF BIRTH:  [birth year]   DATE OF ADMISSION:  DATE OF DISCHARGE:  LH                              HISTORY & PHYSICAL   CHIEF COMPLAINT:  Hashimoto thyroiditis and multinodular goiter.   HISTORY OF PRESENT ILLNESS:  The patient is a 55 year old white female  who is referred for evaluation and treatment of goiter.  She has been  followed for some time by Dr. Ronnald Collum.  She has failed medical therapy.  She had a biopsy in the past, which was negative for malignancy.  She  does have a tracheal deviation with a pressure sensation on her throat,  which causes air hunger at times.  No heat intolerance or palpitations  are noted.   PAST MEDICAL HISTORY:  Includes morbid obesity, COPD, non-insulin-  dependent diabetes mellitus, and hypertension.   PAST SURGICAL HISTORY:  Hysterectomy, cholecystectomy, appendectomy, and  exploratory surgery.   CURRENT MEDICATIONS:  1. WelChol 625 mg 3 tablets b.i.d.  2. Avandamet 2/500 mg 1 tablet b.i.d.  3. Benicar/hydrochlorothiazide 40/12.5 mg p.o. daily.  4. Armour Thyroid supplements 120 mcg p.o. daily.  5. Cymbalta 60 mg p.o. daily.  6. Lasix 80 mg p.o. b.i.d.  7. Klor-Con 20 mEq p.o. b.i.d.  8. Prevacid 30 mg p.o. daily.  9. Calcium supplements 500 mg p.o. daily.  10.Multivitamin 1 tablet p.o. daily.   ALLERGIES:  PENICILLIN, BIAXIN, CLINDAMYCIN.   REVIEW OF SYSTEMS:  The patient denies alcohol or tobacco use.  She  denies any recent cardiopulmonary difficulties or breathing disorders  except as noted.   PHYSICAL EXAMINATION:  GENERAL:  On physical examination, the patient is  a morbidly obese white female in no acute distress.  NECK:  Supple and somewhat short and thick.  Fullness is noted in the  lower aspect of the  trachea around the thyroid.  No specific masses are  noted.  LUNGS:  Clear to auscultation with equal breath sounds bilaterally.  HEART:  Reveals regular rate and rhythm without S3, S4, or murmurs.   IMPRESSION:  1. Multinodular goiter.  2. History of Hashimoto thyroiditis.   PLAN:  The patient is scheduled for a total thyroidectomy on December 03, 2007.  The risks and benefits of the procedure including bleeding,  infection, nerve injury, the possibility of tracheostomy were fully  explained to the patient, gave informed consent.      [name], M.D.     MAJ/MEDQ  D:  11/27/2007  T:  11/28/2007  Job:  [id number]   cc:   Lenard Simmer, M.D.  Fax: [phone]

## 2010-11-02 NOTE — Assessment & Plan Note (Signed)
[place] OFFICE NOTE   NAME:[name] [name], [name] [name]                      MRN:          [id number]  DATE:07/19/2007                            DOB:          [birth year]    [name] [name] seen today as a new patient at the request of Catha Nottingham  from [facility].  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  The  patient is referred for chest pain, dyspnea, increasing lower extremity  edema.  Unfortunately, [name] [name] is a morbidly obese.  Her son died suddenly  last year at home from a heart attack.  She has gained an enormous  amount of weight since then and was heavy to begin with.   She is currently 350 pounds.   Her dyspnea has been progressive over the last few weeks.  She was seen  a week or two ago at [facility].  She had a CT scan that ruled out PE.  There was a small area of central left upper lobe opacity concerning for  inflammatory process or pneumonia.  She apparently was treated with what  sounds like a Z-Pak.  She has not had a cough, sputum production, or  fever.  She has not had any pleuritic pain.  They also noted a 4.9-cm  solitary right thyroid nodule that needs further workup.  The patient's  dyspnea is ongoing.  It is clearly related to her weight.  She has no  previous history of chronic lung disease or PE.  She does not have a  history of heart problems.   She has been seen by [facility] in the past.  She describes  having had a stress test.  As far as I know, she has not had a heart  cath before.   I told [name] [name] that we could do an echocardiogram to assess RV and LV  function and make sure they are normal.   In regards to pain, she does occasionally get pains in the center of her  chest.  They are atypical for angina.  They are not exertional.  They  can radiate towards her back.  They are sometimes positional.  They are  not progressive.   She has not had any nitroglycerin, and they tend to be very fleeting,  lasting seconds.   REVIEW OF SYSTEMS:  Her review of systems otherwise is remarkable for  some increased lower extremity edema.  She is on Lasix and sometimes  appears to need more.   PAST MEDICAL HISTORY:  1. Previous tonsillectomy in 1958.  2. C-section in  1977.  3. Gallbladder and 1996.  4. Foot surgery in 2000.  5. She had exploratory laparotomy in 2003.  6. Hysterectomy in 1985.   ALLERGIES:  THE PATIENT IS ALLERGIC TO PENICILLIN, BIAXIN NEOSPORIN EYE  DROPS SHE IS NOT ALLERGIC TO DYE.   SOCIAL HISTORY:  She is a nonsmoker, nondrinker.   She is widowed.  She has one living child.  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  She tends to eat when she is depressed,  and she has been depressed since her son's death.   MEDICATIONS:  1. Welchol 625 b.i.d.  2. Avandamet 2/500 b.i.d.  3. Cymbalta 60 a day.  4. Klor-Con 28 a day.  5. Benicar HCTZ 40/12.5.  6. Lasix 80 a day.  7. Fexofenadine 800 mg a day.  8. Synthroid 212 mcg a day.  9. Prevacid 30 a day.  10.Multivitamins.   FAMILY HISTORY:  Noncontributory.   PHYSICAL EXAMINATION:  GENERAL:  Morbidly obese white female in no  distress.  VITAL SIGNS:  Weight is 350.  Blood pressure is 130/80, pulse is 80 and  regular, afebrile, respiratory rate 16.  NECK:  She does have a palpable right thyroid nodule.  JVP is not  visible.  No carotid bruits.  LUNGS:  Clear including the right and left upper lobes.  No wheezing.  Good diaphragmatic motion.  CARDIOVASCULAR:  S1 and S2 with distant heart sounds.  PMI not palpable.  ABDOMEN:  Protuberant with previous surgeries.  Bowel sounds positive.  No AAA.  No hepatosplenomegaly, no hepatojugular reflux.  No tenderness,  no bruit.  EXTREMITIES:  Distal pulses are intact with +1 edema bilaterally.  She  has multiple excoriations.  NEUROLOGIC:  Nonfocal.  SKIN:  Warm and  dry.   Her EKG is normal.   IMPRESSION:  1. Increasing dyspnea in the setting of a CT that shows possible      atypical pneumonia.  The patient's dyspnea is functionally related      to her weight.  Will do a 2-D echocardiogram to make sure that her      right ventricular and left ventricular function are normal.  Will      also rule out occult pulmonary hypertension, given her body      habitus.  2. Lower extremity edema.  She talks about a history of cirrhosis;      however, I suspect she has fatty liver from her weight.  There is      no obvious evidence of ascites.  Rather than starting Aldactone, I      think we will just write her a prescription for Lasix 80 b.i.d. and      Klor-Con 20 b.i.d.  She will take a second dose of diuretic as      needed depending  on her swelling and her weight.  1. Atypical chest pain.  We will have to get some records.  She      apparently has seen [facility] in the past and had a      false positive stress test.  She did not have a followup      catheterization.  I really do not think that she needs a stress      test at this point.  Her symptoms are atypical, and she has a      normal EKG.  2. Central obesity.  This is really the central issue here.  She he is      on Medicaid, and it may be difficult to arrange bariatric surgery.      However, she desperately needs to lose weight.  3. 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      Continue Avandamet.  Followup hemoglobin A1c quarterly.  4. Thyroid nodule.  I do not have thyroid studies on her, but it would      be important to get.  She is on  Synthroid, so clearly people must      not think that she has a toxic goiter.  I suspect she will need a      biopsy of this is indicated on the x-rays.  5. Anxiety/depression leading to overeating and central obesity.      Continue Cymbalta 60 a day.   So long as the patient's echocardiogram is normal, she will be seen  on  an as-needed basis.     [name]. Johnsie Cancel, MD, [facility]  Electronically Signed    PCN/MedQ  DD: 07/18/2007  DT: 07/19/2007  Job #: [id number]   cc:   Catha Nottingham

## 2010-11-02 NOTE — H&P (Signed)
[name] [name], [name] [name]               ACCOUNT NO.:  [id number]   MEDICAL RECORD NO.:  [id number]          PATIENT TYPE:  AMB   LOCATION:  DAY                           FACILITY:  [facility]   PHYSICIAN:  [name], M.D.  DATE OF BIRTH:  [birth year]   DATE OF ADMISSION:  DATE OF DISCHARGE:  [facility]                              HISTORY & PHYSICAL   CHIEF COMPLAINT:  Hashimoto thyroiditis and multinodular goiter.   HISTORY OF PRESENT ILLNESS:  The patient is a 55 year old white female  who is referred for evaluation and treatment of goiter.  She has been  followed for some time by Dr. Ronnald Collum.  She has failed medical therapy.  She had a biopsy in the past, which was negative for malignancy.  She  does have a tracheal deviation with a pressure sensation on her throat,  which causes air hunger at times.  No heat intolerance or palpitations  are noted.   PAST MEDICAL HISTORY:  Includes morbid obesity, COPD, non-insulin-  dependent diabetes mellitus, and hypertension.   PAST SURGICAL HISTORY:  Hysterectomy, cholecystectomy, appendectomy, and  exploratory surgery.   CURRENT MEDICATIONS:  1. WelChol 625 mg 3 tablets b.i.d.  2. Avandamet 2/500 mg 1 tablet b.i.d.  3. Benicar/hydrochlorothiazide 40/12.5 mg p.o. daily.  4. Armour Thyroid supplements 120 mcg p.o. daily.  5. Cymbalta 60 mg p.o. daily.  6. Lasix 80 mg p.o. b.i.d.  7. Klor-Con 20 mEq p.o. b.i.d.  8. Prevacid 30 mg p.o. daily.  9. Calcium supplements 500 mg p.o. daily.  10.Multivitamin 1 tablet p.o. daily.   ALLERGIES:  PENICILLIN, BIAXIN, CLINDAMYCIN.   REVIEW OF SYSTEMS:  The patient denies alcohol or tobacco use.  She  denies any recent cardiopulmonary difficulties or breathing disorders  except as noted.   PHYSICAL EXAMINATION:  GENERAL:  On physical examination, the patient is  a morbidly obese white female in no acute distress.  NECK:  Supple and somewhat short and thick.  Fullness is noted in the  lower aspect of the  trachea around the thyroid.  No specific masses are  noted.  LUNGS:  Clear to auscultation with equal breath sounds bilaterally.  HEART:  Reveals regular rate and rhythm without S3, S4, or murmurs.   IMPRESSION:  1. Multinodular goiter.  2. History of Hashimoto thyroiditis.   PLAN:  The patient is scheduled for a total thyroidectomy on December 03, 2007.  The risks and benefits of the procedure including bleeding,  infection, nerve injury, the possibility of tracheostomy were fully  explained to the patient, gave informed consent.      [name], M.D.  Electronically Signed     MAJ/MEDQ  D:  11/27/2007  T:  11/28/2007  Job:  [id number]   cc:   Lenard Simmer, M.D.  Fax: [phone]

## 2010-11-02 NOTE — Consult Note (Signed)
[name] [name], [name] [name]               ACCOUNT NO.:  [id number]   MEDICAL RECORD NO.:  [id number]          PATIENT TYPE:  EMS   LOCATION:  ED                            FACILITY:  [facility]   PHYSICIAN:  [name], M.D.  DATE OF BIRTH:  1955/07/19   DATE OF CONSULTATION:  DATE OF DISCHARGE:  12/25/2007                                 CONSULTATION   CHIEF COMPLAINT:  Lightheadedness and generalized weakness.   HISTORY OF PRESENT ILLNESS:  The patient is a 55 year old white female  status post a total thyroidectomy for thyroid carcinoma by myself on  December 03, 2007, who presented to the emergency room after becoming tired  and sweaty while walking around [place].  She called my office, and I  instructor her to go to the emergency room for further evaluation and  treatment.   She states that she is feeling somewhat better with fluid bolus.  She  denies any chest pain.  She has been evaluated by Cardiology in the past  due to her morbid obesity and in preparation for possible gastric bypass  surgery.  The rest of her history and physical examination are noted on  the emergency room chart.   In general, her neck has healed well.  I saw her in my office 1 week  ago, when her calcium, T4, and TSH levels were all within normal limits.  She has had a raspy voice postoperatively, which has been improving.  She will be evaluated by an ENT doctor in a few days for this.  Her  raspiness has improved since surgery.   Her blood work including chem-7, CBC, calcium level, D-dimer, and 12-  lead EKG only revealed slight anemia with hematocrit of 33.4 as well as  a sinus tachycardia.  She did rule out for a myocardial infarction.   It is felt the patient has been dehydrated and will be given a fluid  bolus in the emergency room.  She is being discharged from the emergency  room in good and stable condition.  I will see the patient in my office  in 2 days for followup.  This has been discussed with  the patient, who  agrees to the treatment plan.   There does not appear to be any complication from the thyroid surgery  that would cause her symptomatology at this point.      [name], M.D.  Electronically Signed     MAJ/MEDQ  D:  12/25/2007  T:  12/26/2007  Job:  [id number]   cc:   [name], M.D.  Fax: [phone]

## 2010-11-02 NOTE — Assessment & Plan Note (Signed)
[name] [name]                            CARDIOLOGY OFFICE NOTE   NAME:Roughton, KIERNAN FARKAS                      MRN:          [id number]  DATE:02/26/2008                            DOB:          1955-11-14    HISTORY OF PRESENT ILLNESS:  [name] [name] returns today for followup.  I have  seen her in the past for somewhat atypical chest pain, exertional  dyspnea, and lower extremity edema.  She has morbid obesity.  She  recently had surgery for multinodular goiter with Hashimoto thyroiditis.   This seemed to go well.  She said there was a nodule of cancer, but her  borders were clean.   She apparently is being switched from Synthroid to Armour Thyroid and I  encouraged her to make sure she took her medication for this.  She has  been seeing a Social worker.  She appears to be going forward with her  attempted bariatric surgery.  She has been in contact with Dr. Eldridge Abrahams at [facility] on her current efforts, since  she has gone from 353 pounds to 315 pounds.  In light of this, she will  need some preop clearance.   The bariatric surgery is much higher risk in regards to her heart.  She  continues to have occasional exertional central chest pressure, given  her size.  She will need a 2-D stress Myoview study.   Her last Myoview study was 7 or 8 years ago at [facility].  I do not  have these results.  She does not have any rest pain.  She has some  exertional dyspnea, which is likely from her morbid obesity.   She does have lower extremity edema, which is improved.  She has been  compliant with her medications.  She believes her blood sugars, have  been running in the low to mid 90s and thinks her hemoglobin A1c is  fine, but does not know the results.   REVIEW OF SYSTEMS:  Otherwise negative.   ALLERGIES:  She is allergic to NEOSPORIN EYE DROPS, BIAXIN, and  PENICILLIN.   MEDICATIONS:  1. WelChol 625 b.i.d.  2. Avandamet 2/500  b.i.d.  3. Cymbalta 60 a day.  4. Klor-Con 20 a day.  5. Benicar HCT  40/12.5.  6. Lasix 80 a day.  7. Fexofenadine 180 a day.  8. Prevacid 30 a day.  9. Multivitamins.   PHYSICAL EXAMINATION:  VITAL SIGNS:  Remarkable for weight of 315, blood  pressure 140/60, pulse 80 and regular, respiratory rate 14, afebrile.  HEENT:  Unremarkable.  NECK:  Carotids are normal without bruit.  No lymphadenopathy, no  thyromegaly, no JVP elevation.  LUNGS:  Clear.  Good diaphragmatic motion.  No wheezing.  She is status  post recent thyroidectomy with an anterior scar.  HEART:  __________ S1 and S2 with distant heart sounds.  PMI not  palpable.  ABDOMEN:  Protuberant.  Bowel sounds positive.  No AAA, no tenderness,  no bruit, no hepatosplenomegaly, no hepatojugular reflux, no tenderness.  Significant pannus.  EXTREMITIES:  Distal pulses are intact with +  1 edema bilaterally.  Mild  excoriations and dry skin.  NEURO:  Nonfocal.  SKIN:  Warm and dry.  MUSCULOSKELETAL:  No muscular weakness.   Her baseline EKG shows sinus rhythm with nonspecific ST-T wave changes  and poor R-wave progression.   IMPRESSION:  1. Hypertension, currently well controlled.  Continue current dose of      Benicar and Lasix.  2. Lower extremity edema, improved.  Continue current dose of Lasix,      low-salt diet.  3. Central obesity.  Continue to follow up with Dr. Toney Rakes with      weight loss program, adenosine Myoview to risk stratify for      bariatric surgery.  4. A recent thyroidectomy.  Continue thyroid replacement.  TSH and T4      in 3 months.  5. History of reflux.  Continue Prevacid.  6. Diabetes.  Followup hemoglobin A1c quarterly.  Continue Avandamet.      Avoid Actos.     [name] [name]. Johnsie Cancel, MD, [facility]  Electronically Signed    PCN/MedQ  DD: 02/26/2008  DT: 02/27/2008  Job #: [id number]

## 2010-11-02 NOTE — Assessment & Plan Note (Signed)
[place] OFFICE NOTE   NAME:[name], [name] SUI                      MRN:          [id number]  DATE:09/08/2008                            DOB:          August 30, 1955    [name] [name] returns today for followup.  She is morbidly obese.  I have seen  her for hypertension, shortness of breath, and edema.  Most of these  have been related to her weight.  I congratulated her, she has lost  about 50 pounds over the last year.  She is followed up with Dr.  Toney Rakes at [facility] and is to have bariatric surgery in April.  She  would have had this sooner, but the psychiatrist that she has to see as  part of the program does not accept insurance and she had to borrow 600  dollars.   From a cardiac perspective, she is stable.  She is not having any  significant chest pain.  She had an adenosine Myoview study on March 12, 2008, which was normal without evidence of ischemia and her EF was  76%.   I told her that in itself would clear her for surgery.   She has mild chronic lower extremity edema and takes Lasix for this.  It  has been stable.  She was advised to watch her salt in her diet.   Her hypertension has been well controlled.  Her dyspnea has actually  improved with her weight loss.  She has not had a cough, PND, orthopnea.  She has baseline lower extremity edema.  There is no history of PE or  DVT.  No history of coronary artery disease.   I think she will do fine with her surgery and I encouraged her proceed  with this in April.   REVIEW OF SYSTEMS:  Remarkable for ongoing thyroid problems.  She had a  complex goiter with a resection and unfortunately I think there is some  thyroid tissue left.  She has been switched to Armour Thyroid  replacement, but apparently has more issues with this.  Following her  last surgery, she almost lost her voice.  She saw an ENT doctor at  [facility] and required collagen injections.   These were extremely painful  and the patient does not want to have them anymore.   Otherwise, her review of systems is negative.  She did just take a fall  this past weekend and has some bruising on her legs.   CURRENT MEDICATION:  1. Welchol 625 a day.  2. Avandamet 2500 b.i.d.  3. Cymbalta 60 a day.  4. Klor-Con 20 a day.  5. Benicar/hydrochlorothiazide 40/12.5.  6. Lasix 80.  7. Fexofenadine 180 a day.  8. Prevacid 30 a day.  9. Multivitamins, calcium, vitamin D, and Armour Thyroid 120 mcg a      day.   PHYSICAL EXAMINATION:  GENERAL:  Remarkable for morbidly obese female.  Affect is appropriate.  VITAL SIGNS:  Weight is down to 305, back in March 2009, she was 353.  Blood pressure 142/88, pulse 88 and regular, respiratory rate 14,  afebrile.  Affect appropriate.  HEENT:  Unremarkable.  Carotids normal without bruit.  No  lymphadenopathy, thyromegaly, JVP elevation.  LUNGS:  Clear.  Good diaphragmatic motion.  No wheezing.  S1 and S2.  Distant heart sounds, PMI not palpable.  ABDOMEN:  Benign.  Bowel sounds positive.  No AAA, no tenderness, no  bruit.  No hepatosplenomegaly, hepatojugular reflux, tenderness.  Distal  pulses are intact with trace edema.  Multiple ecchymoses and bruises on  the right thigh and right leg from her fall.  PTs were +2 bilaterally.  NEURO:  Nonfocal.  SKIN:  Warm and dry.  No muscular weakness.   Baseline EKG is normal except for poor R-wave progression secondary to  body habitus.   IMPRESSION:  1. In regards to preoperative clearance for bariatric surgery, she is      clear.  She has to see a psychiatrist.  She will do this at the end      of April.  2. In regards to her shortness of breath, it is functional.  It is      improving with weight loss.  No evidence of primary cardiopulmonary      limitations.  3. Hypertension, currently well controlled.  Continue current dose of      diuretic and ACE inhibitor.  4. Diabetes.  Hemoglobin A1c  per primary care doctor.  Continue      Avandamet.  Hopefully her requirements for sugar medicine will go      down as she loses weight.  5. Recent fall.  No evidence of broken bones or deep venous      thrombosis.  Apply ice to the right thigh as needed.  6. Hypertension, currently well controlled, low-sodium diet.  7. Multinodular goiter with thyroid surgery.  Continue Armour      replacement.  Followup ENT and endocrine at [facility].   Overall, I think Tiffnay is doing well.  She should do fine with her  bariatric surgery.  I will see her back in a year.     [name]. Johnsie Cancel, MD, [facility]  Electronically Signed    PCN/MedQ  DD: 09/08/2008  DT: 09/09/2008  Job #: [id number]

## 2010-11-02 NOTE — Procedures (Signed)
NAMEALEXEA, [name] [name]               ACCOUNT NO.:  [id number]   MEDICAL RECORD NO.:  [id number]          PATIENT TYPE:  OUT   LOCATION:  RAD                           FACILITY:  [facility]   PHYSICIAN:  Cristopher Estimable. Lattie Haw, MD, FACCDATE OF BIRTH:  07/24/1955   DATE OF PROCEDURE:  07/23/2007  DATE OF DISCHARGE:                                ECHOCARDIOGRAM   REFERRING PHYSICIAN:  [name]. Johnsie Cancel, MD, [facility]   CLINICAL DATA:  A 55 year old woman with history of hypertension and  peripheral edema.   M-mode aorta 3.0, left atrium 4.7, LV diastole 4.0, LV systole 3.2.   1. Technically suboptimal but adequate echocardiographic study.  2. Mild left atrial enlargement; normal right atrial size.  3. Normal right ventricular size; mild RVH; hyperdynamic RV systolic      function.  4. Normal aortic, mitral and tricuspid valves.  5. Normal proximal ascending aorta.  6. Normal main pulmonary artery; pulmonic valve not adequately imaged      but is grossly normal.  7. There is a slight increase in velocity across the aortic valve,      more likely representing increased flow rather than a transvalvular      gradient.  8. Normal left ventricular size; mild to moderate hypertrophy;      vigorous regional and global function.  9. Normal IVC.      Cristopher Estimable. Lattie Haw, MD, [facility]  Electronically Signed     RMR/MEDQ  D:  07/24/2007  T:  07/24/2007  Job:  [id number]

## 2010-11-02 NOTE — Op Note (Signed)
[name] [name], [name] [name]               ACCOUNT NO.:  [id number]   MEDICAL RECORD NO.:  [id number]          PATIENT TYPE:  INP   LOCATION:  IC06                          FACILITY:  [facility]   PHYSICIAN:  [name], M.D.  DATE OF BIRTH:  18-Apr-1956   DATE OF PROCEDURE:  12/03/2007  DATE OF DISCHARGE:                               OPERATIVE REPORT   PREOPERATIVE DIAGNOSES:  Multinodular goiter, history of Hashimoto  thyroiditis, airway compromise.   POSTOPERATIVE DIAGNOSIS:  Multinodular goiter, history of Hashimoto  thyroiditis, airway compromise.   PROCEDURE:  Total thyroidectomy.   SURGEON:  [name], MD   ASSISTANT:  Chelsea Primus, MD   ANESTHESIA:  General endotracheal.   INDICATIONS:  The patient is a 55 year old white female who has been  followed for some time by Dr. Belinda Fisher for [facility] thyroiditis  and multinodular goiter.  She has had increasing difficulties with  airway compromise and pressure.  Despite medical therapy, her symptoms  have worsened.  The patient now comes to the operating room for total  thyroidectomy.  The risks of both the procedure including bleeding,  infection, nerve injury, and the possible voice changes and the  possibility of tracheostomy were fully explained to the patient, gave  informed consent.   PROCEDURE NOTE:  The patient was placed in the supine position.  After  induction of general endotracheal anesthesia, the neck was prepped and  draped in usual sterile technique with Betadine.  Surgical site  confirmation was performed.   A transverse incision was made 2 fingerbreadths above the jugular notch.  The platysma was divided without difficulty.  The strap muscles were  then divided along the midline and bluntly freed away in order to  facilitate the thyroid dissection.  The right thyroid lobe was first  approached.  The middle thyroidal artery, superior thyroidal artery,  inferior thyroidal artery, and veins were all  divided and ligated using  small clips.  The superior and inferior parathyroids were noted.  The  right lobe was then mobilized lateral to medial towards the trachea.  There was then the suspensory ligament of Gwenlyn Found was also ligated and  divided using a 3-0 silk tie.  Of note was the fact that the right lobe  of the thigh was significantly enlarged and rotating and displacing the  trachea to the left.  The dissection was taken across the anterior  portion of the thyroid and the right lobe of the thyroid was excised and  removed from the operative field.  It was sent to pathology for further  examination.  The left lobe of the thyroid was noted to be significantly  smaller and rotated posteriorly.  The middle thyroidal artery and vein  as well as the suspensory ligament of Berry as well as the inferior  thyroidal artery were ligated and divided without difficulty.  Again,  the left lobe of thyroid was dissected lateral to medial and was  dissected free from the trachea without difficulty.  Was sent to  pathology for further examination.  Any bleeding was controlled using  small clips.  The  wound was then copiously irrigated and Surgicel was  placed in both thyroid lobe beds.  Pressure was then held.  On further  inspection, no abnormal bleeding was noted at the end of the procedure.  A #10-French drain was placed into both lobes of the thyroid beds and  brought out through separate stab wound to the left of the incision.  This was secured at skin level using 4-0 nylon interrupted suture.  The  midline strap muscle was reapproximated using 3-0 Vicryl running suture.  The platysma was reapproximated using 3-0 Vicryl running suture.  The  skin was closed using a 4-0 Vicryl subcuticular suture.  Benzoin and  Steri-Strips were then applied.  Dry sterile dressing was then applied.   All tape and needle counts were correct at the end of the procedure.  The patient was extubated in the operating  room and transferred to the  Intensive Care Unit for routine recovery in stable condition.  She was  able to vocalize the sound E upon extubation.  No airway difficulties  were noted.   COMPLICATIONS:  None.   SPECIMEN:  Right lobe of thyroid with isthmus, left lobe of thyroid.   ESTIMATED BLOOD LOSS:  125 mL.   DRAINS:  A #10-French drain to neck.      [name], M.D.  Electronically Signed     MAJ/MEDQ  D:  12/03/2007  T:  12/04/2007  Job:  [id number]

## 2010-11-05 NOTE — Op Note (Signed)
[name], [name]               ACCOUNT NO.:  [id number]   MEDICAL RECORD NO.:  [id number]          PATIENT TYPE:  AMB   LOCATION:  DAY                           FACILITY:  [facility]   PHYSICIAN:  Hildred Laser, M.D.    DATE OF BIRTH:  02-12-1956   DATE OF PROCEDURE:  DATE OF DISCHARGE:                               OPERATIVE REPORT   PROCEDURE:  Esophagogastroduodenoscopy.   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  She is undergoing EGD primarily  looking for varices.  She was recently evaluated in office for upper  abdominal pain.  Her evaluation was negative and this pain has resolved.  Procedure and risks were reviewed the patient, informed consent was  obtained.   MEDS FOR CONSCIOUS SEDATION:  Benzocaine spray for pharyngeal topical  anesthesia, Demerol 50 mg IV, Versed 12 mg IV in divided dose.   FINDINGS:  Procedure performed in endoscopy suite.  The patient's vital  signs and O2 sat were monitored during procedure and remained stable.  The patient was placed left lateral position and Pentax videoscope was  passed via oropharynx without any difficulty into esophagus.   Esophagus:  Mucosa of the esophagus was normal.  No esophageal varices  were identified.  GE junction was at 40 cm from the incisors and was  unremarkable.   Stomach.  It was empty and distended very well insufflation.  Folds of  proximal stomach were normal.  Examination of mucosa at body, antrum,  pyloric channel as well as angularis, fundus and cardia was normal.   Duodenum.  Bulbar mucosa was normal.  Scope was passed to second part of  the duodenum where mucosa and folds were normal.  Endoscope was  withdrawn.  The patient tolerated the procedure well.   FINAL DIAGNOSIS:  Normal esophagogastroduodenoscopy.   No evidence of esophageal or fundal varices.  No evidence of portal  gastropathy.   RECOMMENDATIONS:  She will continue her  program gradual weight  reduction.   May consider follow-up EGD in two to three years depending on her  clinical course.      Hildred Laser, M.D.  Electronically Signed     NR/MEDQ  D:  08/28/2006  T:  08/29/2006  Job:  [id number]   cc:   Sherrilee Gilles. Gerarda Fraction, MD  Fax: [phone]

## 2010-11-05 NOTE — Op Note (Signed)
NAME:  [name], [name]                         ACCOUNT NO.:  [id number]   MEDICAL RECORD NO.:  [id number]                   PATIENT TYPE:  AMB   LOCATION:  DAY                                  FACILITY:  [facility]   PHYSICIAN:  Hildred Laser, M.D.                 DATE OF BIRTH:  04-Feb-1956   DATE OF PROCEDURE:  04/23/2003  DATE OF DISCHARGE:                                 OPERATIVE REPORT   PROCEDURE:  Esophagogastroduodenoscopy with esophageal dilatation followed  by total colonoscopy.   INDICATIONS FOR PROCEDURE:  [name] [name] is a 55 year old Caucasian female who has  chronic GERD who now presents with intermittent solid food dysphagia. She  decided to also have a screening colonoscopy while she is here for EGD.   The procedure is reviewed with the patient and informed consent was  obtained.   PREOP MEDICATIONS:  Cetacaine spray for pharyngeal topical anesthesia,  fentanyl 75 mcg IV in divided dose,  Versed 15 mg IV.   FINDINGS:  Procedure performed in endoscopy suite. The patient's vital signs  and O2 sat were monitored during the procedure and remained stable.   The patient was placed in the left lateral decubitus position and Olympus  videoscope was passed through oropharynx without any difficulty into the  esophagus.   ESOPHAGUS:  The mucosa of the esophagus was normal. Distally there was a 3 x  6 mm tiny patch of ectopic gastric mucosa. The squamocolumnar junction was  unremarkable. There was on ring, stricture or hernia.   STOMACH:  It was empty and distended very well with insufflation. Folds of  the proximal stomach were normal. Examination of the mucosa at gastric body,  antrum, pyloric channel as well as angularis, fundus and cardia was normal.   DUODENUM:  Examination of the bulb and second part of the duodenum was  normal.   The endoscope was withdrawn and esophagus was dilated by passing 56 [place]  Maloney dilator through esophagus completely. As the dilator was  withdrawn,  the endoscope was  passed again and there was no mucosal injury to the  esophagus.   The endoscope was withdrawn and the patient prepared for procedure #2.   PROCEDURE:  Colonoscopy.   DESCRIPTION OF PROCEDURE:  Rectal examination performed. No abnormality  noted on external or digital exam. The Olympus videoscope was placed in the  rectum and advanced under direct vision to the sigmoid colon and beyond.  Preparation was satisfactory. The scope was passed through the cecum which  was identified by the appendiceal orifice and the ileocecal valve. Pictures  taken for the record. As the scope was withdrawn, the colonic mucosa was  once again carefully examined. There were three polyps at the transverse  colon all of which were small, two were snared, one was lost. the other one  was retrieved for histologic examination. There was a third small polyp also  at the transverse  colon which was coagulated using snare tip. The mucosa in  the rest of the colon was normal. The rectal mucosa similarly was normal.  The scope was retroflexed to examine the anorectal junction and small  hemorrhoids were noted below the dentate line. The endoscope was  straightened and withdrawn. The patient tolerated the procedure well.   FINAL DIAGNOSES:  1. 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  Esophagus dilated by passing a 56 French Maloney dilator but     no mucosal noted to esophagus.  2. Three small polyps of the transverse colon, two were snared and one was     coagulated.  3. Small external hemorrhoids.   RECOMMENDATIONS:  Standard instructions given. I will contact the patient  with biopsy results and further recommendations.      ___________________________________________                                            Hildred Laser, M.D.   NR/MEDQ  D:  04/23/2003  T:  04/23/2003  Job:  [id number]

## 2010-11-05 NOTE — Discharge Summary (Signed)
[name] [name], [name] [name]               ACCOUNT NO.:  [id number]   MEDICAL RECORD NO.:  [id number]          PATIENT TYPE:  INP   LOCATION:  A341                          FACILITY:  [facility]   PHYSICIAN:  [name], M.D.  DATE OF BIRTH:  Nov 14, 1955   DATE OF ADMISSION:  12/03/2007  DATE OF DISCHARGE:  06/17/2009LH                               DISCHARGE SUMMARY   HOSPITAL COURSE SUMMARY:  The patient is a 55 year old white female who  presented for surgery due to history of multinodular goiter and  Hashimoto's thyroiditis.  She underwent total thyroidectomy on December 03, 2007.  She tolerated the procedure well.  The postoperative course was  remarkable only for a sore throat and generalized malaise.  There was no  evidence of airway compromise.  She was discharged home in fair and  stable condition.   DISCHARGE INSTRUCTIONS:  The patient is to follow up with Dr. Aviva Signs in 1 week.   Discharge medications included Welchol 625 mg p.o. b.i.d., Avandamet 2 -  500 mg p.o. b.i.d., Benicar 1 tablet p.o. daily, Armour Thyroid 180 mcg  p.o. daily, Cymbalta 60 mg p.o. daily, Lasix 80 mg p.o. b.i.d., Klor-Con  20 mEq  p.o. b.i.d., Prevacid 30 mg p.o. daily, calcium supplements 1  tablet p.o. daily, vitamin E 1 tablet p.o. daily, vitamin D 1 tablet  p.o. daily, multivitamin 1 tablet p.o. daily, Ativan 1 mg p.o. q.6 h  p.r.n., Proventil inhaler 2 puffs twice a day, Allegra 180 mg p.o.  daily, Vistaril 25 mg p.o. q.6 h p.r.n., methocarbamol 500 mg p.o.  b.i.d., and Vicodin 1-2 tablets p.o. q.4 h p.r.n. pain.   PRINCIPAL DIAGNOSES:  1. Multinodular goiter, Hashimoto's thyroiditis, final pathology      pending.  2. Morbid obesity.  3. Chronic obstructive pulmonary disease.  4. Non-insulin-dependent diabetes mellitus.  5. Hypertension.   PRINCIPAL PROCEDURE:  Total thyroidectomy on December 03, 2007.      [name], M.D.  Electronically Signed     MAJ/MEDQ  D:  01/07/2008  T:   01/07/2008  Job:  [id number]   cc:   Lenard Simmer, M.D.  Fax: [phone]

## 2010-11-05 NOTE — Letter (Signed)
October 03, 2007     RE:  [name], [name]  MRN:  [id number]  /  DOB:  Jul 27, 1955   To Whom It May Concern:   This letter is written on behalf of [name] [name].  I have seen [name] [name] in  consultation in the past for somewhat atypical chest pain.   She was found have a normal EKG, benign exam and an echocardiogram done  on July 23, 2007 showing normal LV function.  Stress testing on [name] [name]  is difficult due to her size.  The patient would greatly benefit from  gastric bypass surgery.   During our last office visit and evaluation, she weighed 353 pounds.  The patient already has complications from her weight in regards to  insulin resistance and diabetes.   I have talked to [facility] about this and recommended that she have gastric  bypass surgery.  From a cardiac perspective, I think she would do fine  with it.  If you have any further questions in regards to a  recommendation that [facility] have gastric bypass surgery, feel free to  contact me.    Sincerely,      [name]. Johnsie Cancel, MD, [facility]  Electronically Signed    PCN/MedQ  DD: 10/03/2007  DT: 10/03/2007  Job #: [id number]   CC:    [name]

## 2010-11-18 NOTE — Cardiovascular Report (Signed)
NAMEEMALENE, WELTE               ACCOUNT NO.:  [id number]  MEDICAL RECORD NO.:  [id number]           PATIENT TYPE:  LOCATION:                                 FACILITY:  PHYSICIAN:  Loretha Brasil. Lia Foyer, MD, FACCDATE OF BIRTH:  [birth year]  DATE OF PROCEDURE: DATE OF DISCHARGE:                           CARDIAC CATHETERIZATION   INDICATIONS:  Ms. Dyches is a very pleasant 55 year old female who has had chest pain and shortness of breath.  She has obstructive sleep apnea, but has refused to wear a CPAP.  She underwent cardiac imaging that showed anterior attenuation with a normalization on the rest images.  There is a reversible anterior defect which was thought to be due to breast attenuation, but ischemia could not be excluded based on the study.  She was seen by Dr. Domenic Polite and subsequently referred for diagnostic catheterization.  Radial approach because of her weight of greater than 350 pounds was suggested and she was brought to the lab today for that evaluation.  PROCEDURES: 1. Left heart catheterization 2. Selective coronary arteriography. 3. Selective left ventriculography.  DESCRIPTION OF PROCEDURE:  The procedure was performed from the left radial artery using 3 mg of intraarterial verapamil and 5000 units of intravenous heparin.  A 5-French sheath was utilized.  Views of the left and right coronary arteries were obtained.  Central aortic left ventricular pressures were measured with pigtail.  Ventriculography was performed in the RAO projection.  There were no noted complications.  A TR band was placed after the completion of procedure with hemostasis.  HEMODYNAMIC DATA: 1. The central aortic pressure was 140/70, mean 90. 2. LV pressure 141/20. 3. There was no gradient or pullback across aortic valve.  ANGIOGRAPHIC DATA: 1. Ventriculography is done in the RAO projection.  Overall systolic     function is preserved.  No segmental abnormalities contraction were     identified. 2. There is minor calcification in the midportion of the left anterior     descending artery. 3. The left main is short.  It bifurcates into a large LAD and a     dominant circumflex vessel. 4. The LAD is noted, has some very mild calcification in the     midvessel.  The LAD proximally is a large-caliber vessel without     critical disease.  There is minimal luminal irregularity in the     midportion of the artery.  No significant areas of focal     obstruction were identified.  The LAD has 1 major diagonal branch     and several smaller diagonal branches in the apical vessel wraps     the apical tip. 5. The circumflex is a fairly large-caliber vessel.  It is a dominant     vessel.  It provides a large bifurcating then trifurcating marginal     branch, and then courses posteriorly where provides a     posterolateral system in a posterior descending branch.  Focal     obstruction is not noted. 6. The right coronary artery is a nondominant vessel.  It is smooth.     There is a tiny PDA  branch which probably courses parallel to the     larger circumflex branch.  There is a small to moderate acute     marginal branch as well.  CONCLUSIONS: 1. Well preserved overall left ventricular systolic function. 2. No evidence of high-grade focal coronary obstruction. 3. Minimal calcification of the LAD.  DISPOSITION:  We will have the patient followup with Dr. Domenic Polite in [place] for continuing evaluation.     Loretha Brasil. Lia Foyer, MD, [facility]     TDS/MEDQ  D:  10/22/2010  T:  10/22/2010  Job:  [id number]  cc:   Phill Myron. Purcell Nails, NP Satira Sark, MD [facility] Leonides Grills, M.D.  Electronically Signed by Bing Quarry MD [facility] on 11/18/2010 09:17:27 AM

## 2011-02-03 ENCOUNTER — Other Ambulatory Visit (HOSPITAL_COMMUNITY): Payer: Self-pay | Admitting: General Surgery

## 2011-02-03 DIAGNOSIS — K469 Unspecified abdominal hernia without obstruction or gangrene: Secondary | ICD-10-CM

## 2011-02-04 ENCOUNTER — Ambulatory Visit (HOSPITAL_COMMUNITY)
Admission: RE | Admit: 2011-02-04 | Discharge: 2011-02-04 | Disposition: A | Discharge status: Home / Self Care | Payer: Medicare Other | Source: Ambulatory Visit | Attending: General Surgery | Admitting: General Surgery

## 2011-02-04 DIAGNOSIS — R1906 Epigastric swelling, mass or lump: Secondary | ICD-10-CM | POA: Insufficient documentation

## 2011-02-04 DIAGNOSIS — R160 Hepatomegaly, not elsewhere classified: Secondary | ICD-10-CM | POA: Insufficient documentation

## 2011-02-04 DIAGNOSIS — R161 Splenomegaly, not elsewhere classified: Secondary | ICD-10-CM | POA: Insufficient documentation

## 2011-02-04 DIAGNOSIS — K469 Unspecified abdominal hernia without obstruction or gangrene: Secondary | ICD-10-CM

## 2011-03-09 LAB — COMPREHENSIVE METABOLIC PANEL
Alkaline Phosphatase: 77
BUN: 14
CO2: 30
Chloride: 100
Creatinine, Ser: 0.7
GFR calc non Af Amer: >60
Glucose, Bld: 112 — ABNORMAL HIGH
Potassium: 3.7
Total Bilirubin: 0.6

## 2011-03-09 LAB — CBC
HCT: 33.9 — ABNORMAL LOW
Hemoglobin: 11.5 — ABNORMAL LOW
MCV: 87.5
Platelets: 163
WBC: 8

## 2011-03-09 LAB — DIFFERENTIAL
Basophils Absolute: 0
Basophils Relative: 1
Lymphocytes Relative: 39
Neutro Abs: 4.1
Neutrophils Relative %: 51

## 2011-03-09 LAB — D-DIMER, QUANTITATIVE: D-Dimer, Quant: 0.76 — ABNORMAL HIGH

## 2011-03-09 LAB — B-NATRIURETIC PEPTIDE (CONVERTED LAB): Pro B Natriuretic peptide (BNP): <30

## 2011-03-17 LAB — BASIC METABOLIC PANEL
BUN: 9
Calcium: 8.1 — ABNORMAL LOW
Chloride: 103
Creatinine, Ser: 0.77
GFR calc Af Amer: >60
GFR calc non Af Amer: >60
Glucose, Bld: 108 — ABNORMAL HIGH
Potassium: 4

## 2011-03-17 LAB — DIFFERENTIAL
Basophils Absolute: 0
Basophils Absolute: 0
Basophils Relative: 0
Basophils Relative: 0
Eosinophils Absolute: 0.1
Eosinophils Relative: 1
Eosinophils Relative: 1
Lymphocytes Relative: 27
Lymphocytes Relative: 36
Lymphs Abs: 2.3
Lymphs Abs: 3
Monocytes Absolute: 0.4
Monocytes Relative: 6
Monocytes Relative: 5
Neutro Abs: 4
Neutro Abs: 4.9
Neutrophils Relative %: 63

## 2011-03-17 LAB — COMPREHENSIVE METABOLIC PANEL
ALT: 21
AST: 24
AST: 27
Albumin: 3.5
Alkaline Phosphatase: 83
BUN: 21
CO2: 32
Calcium: 9.2
Chloride: 103
Creatinine, Ser: 0.99
GFR calc Af Amer: >60
GFR calc Af Amer: >60
GFR calc non Af Amer: >60
Glucose, Bld: 107 — ABNORMAL HIGH
Potassium: 3.5
Sodium: 136
Total Bilirubin: 0.6
Total Protein: 6.3
Total Protein: 7

## 2011-03-17 LAB — URINALYSIS, ROUTINE W REFLEX MICROSCOPIC
Hgb urine dipstick: NEGATIVE
Protein, ur: NEGATIVE
Urobilinogen, UA: 0.2

## 2011-03-17 LAB — CBC
Hemoglobin: 11.2 — ABNORMAL LOW
MCHC: 34.5
MCV: 89.4
Platelets: 126 — ABNORMAL LOW
Platelets: 171
RBC: 3.67 — ABNORMAL LOW
RDW: 14.7
RDW: 14.8
WBC: 8.4

## 2011-03-17 LAB — POCT CARDIAC MARKERS
CKMB, poc: <1 — ABNORMAL LOW
Myoglobin, poc: 50.2
Troponin i, poc: <0.05

## 2011-03-17 LAB — CALCIUM
Calcium: 8 — ABNORMAL LOW
Calcium: 8.2 — ABNORMAL LOW

## 2011-05-18 ENCOUNTER — Ambulatory Visit (HOSPITAL_COMMUNITY)
Admission: RE | Admit: 2011-05-18 | Discharge: 2011-05-18 | Disposition: A | Discharge status: Home / Self Care | Payer: Medicare Other | Source: Ambulatory Visit | Attending: Family Medicine | Admitting: Family Medicine

## 2011-05-18 ENCOUNTER — Other Ambulatory Visit (HOSPITAL_COMMUNITY): Payer: Self-pay | Admitting: Family Medicine

## 2011-05-18 DIAGNOSIS — M25476 Effusion, unspecified foot: Secondary | ICD-10-CM | POA: Insufficient documentation

## 2011-05-18 DIAGNOSIS — M7730 Calcaneal spur, unspecified foot: Secondary | ICD-10-CM | POA: Insufficient documentation

## 2011-05-18 DIAGNOSIS — M25579 Pain in unspecified ankle and joints of unspecified foot: Secondary | ICD-10-CM

## 2011-05-18 DIAGNOSIS — M25473 Effusion, unspecified ankle: Secondary | ICD-10-CM | POA: Insufficient documentation

## 2011-06-29 ENCOUNTER — Emergency Department (HOSPITAL_COMMUNITY)
Admission: EM | Admit: 2011-06-29 | Discharge: 2011-06-30 | Disposition: A | Discharge status: Home / Self Care | Payer: Medicare Other | Attending: Emergency Medicine | Admitting: Emergency Medicine

## 2011-06-29 ENCOUNTER — Encounter (HOSPITAL_COMMUNITY): Payer: Self-pay | Admitting: *Deleted

## 2011-06-29 DIAGNOSIS — I83893 Varicose veins of bilateral lower extremities with other complications: Secondary | ICD-10-CM | POA: Insufficient documentation

## 2011-06-29 DIAGNOSIS — E079 Disorder of thyroid, unspecified: Secondary | ICD-10-CM | POA: Insufficient documentation

## 2011-06-29 DIAGNOSIS — I83899 Varicose veins of unspecified lower extremities with other complications: Secondary | ICD-10-CM

## 2011-06-29 DIAGNOSIS — I10 Essential (primary) hypertension: Secondary | ICD-10-CM | POA: Insufficient documentation

## 2011-06-29 DIAGNOSIS — Z9079 Acquired absence of other genital organ(s): Secondary | ICD-10-CM | POA: Insufficient documentation

## 2011-06-29 DIAGNOSIS — E119 Type 2 diabetes mellitus without complications: Secondary | ICD-10-CM | POA: Insufficient documentation

## 2011-06-29 DIAGNOSIS — J45909 Unspecified asthma, uncomplicated: Secondary | ICD-10-CM | POA: Insufficient documentation

## 2011-06-29 DIAGNOSIS — M129 Arthropathy, unspecified: Secondary | ICD-10-CM | POA: Insufficient documentation

## 2011-06-29 DIAGNOSIS — K219 Gastro-esophageal reflux disease without esophagitis: Secondary | ICD-10-CM | POA: Insufficient documentation

## 2011-06-29 HISTORY — DX: Unspecified osteoarthritis, unspecified site: M19.90

## 2011-06-29 LAB — GLUCOSE, CAPILLARY: Glucose-Capillary: 143 mg/dL — ABNORMAL HIGH (ref 70–99)

## 2011-06-29 MED ORDER — LIDOCAINE-EPINEPHRINE (PF) 1 %-1:200000 IJ SOLN
INTRAMUSCULAR | Status: AC
  Filled 2011-06-29: qty 10

## 2011-06-29 MED ORDER — LIDOCAINE-EPINEPHRINE 2 %-1:100000 IJ SOLN: 30.0000 mL | Freq: Once | INTRAMUSCULAR | Status: DC

## 2011-06-29 NOTE — ED Notes (Signed)
Varicose vein ruptured in leg, bleeding controlled on arrival

## 2011-06-29 NOTE — ED Provider Notes (Signed)
History    This chart was scribed for Hoy Morn, MD, MD by Rhae Lerner. The patient was seen in room APA18 and the patient's care was started at 11:37PM.   CSN: [id number]  Arrival date & time 06/29/11  8366   First MD Initiated Contact with Patient 06/29/11 2310      Chief Complaint  Patient presents with  . Varicose Veins     The history is provided by the patient.   [name] [name] is a 56 y.o. female who presents to the Emergency Department complaining of varicose vein rupturing in left leg onset today 4 hours ago. Pt reports having pain in feet. There is no radiation. The bleeding has been constant since onset. The bleeding was controlled on arrival. Pt has a hx of type 2 diabetes and HTN. No other complaints. No anticoagulant use except for 81 mg ASA  Past Medical History  Diagnosis Date  . Type 2 diabetes mellitus   . Essential hypertension, benign   . GERD (gastroesophageal reflux disease)   . Edema   . Morbid obesity   . COPD (chronic obstructive pulmonary disease)   . Thyroid disease   . Asthma   . Arthritis     Past Surgical History  Procedure Date  . Thyroidectomy 2009  . Cholecystectomy 1996  . Tonsillectomy 1958  . Foot surgery 2000  . Abdominal hysterectomy 1985  . Exploratory laparotomy 2003  . Umbilical hernia repair 2947  . Debridement of abdominal wound 2011    No family history on file.  History  Substance Use Topics  . Smoking status: Never Smoker   . Smokeless tobacco: Never Used  . Alcohol Use: No    OB History    Grav Para Term Preterm Abortions TAB SAB Ect Mult Living                  Review of Systems  All other systems reviewed and are negative.  10 Systems reviewed and are negative for acute change except as noted in the HPI.   Allergies  Biaxin; Clindamycin/lincomycin; Penicillins; and Neosporin  Home Medications   Current Outpatient Rx  Name Route Sig Dispense Refill  . ALPRAZOLAM 0.5 MG PO TABS Oral Take 0.5  mg by mouth 3 (three) times daily.     . ASPIRIN 81 MG PO TBEC Oral Take 81 mg by mouth daily.      . BUPROPION HCL ER (SR) 150 MG PO TB12  150 mg 2 (two) times daily.     . CELEBREX 200 MG PO CAPS  200 mg daily.     . COLESEVELAM HCL 625 MG PO TABS Oral Take 1,875 mg by mouth 2 (two) times daily with a meal. 3 tabs am 3 tabs pm     . DILTIAZEM HCL ER COATED BEADS 240 MG PO CP24 Oral Take 1 capsule (240 mg total) by mouth daily. 30 capsule 11    Increase in dose from 139m to 2434mdaily  . DULOXETINE HCL 60 MG PO CPEP Oral Take 60 mg by mouth 2 (two) times daily.     . FUROSEMIDE 80 MG PO TABS Oral Take 80 mg by mouth daily.      . Marland KitchenLIMEPIRIDE 4 MG PO TABS Oral Take 4 mg by mouth daily.     . Marland KitchenEVOTHYROXINE SODIUM 300 MCG PO TABS Oral Take 300 mcg by mouth daily.      . Marland KitchenIOTHYRONINE SODIUM 5 MCG PO TABS Oral Take 5  mcg by mouth daily.    [name] LISINOPRIL 40 MG PO TABS Oral Take 1 tablet (40 mg total) by mouth daily. 30 tablet 3  . METFORMIN HCL 500 MG PO TABS Oral Take 500 mg by mouth 2 (two) times daily.     [name] SIMVASTATIN 20 MG PO TABS Oral Take 20 mg by mouth daily.     [name] VITAMIN D 400 UNITS PO TABS Oral Take 400 Units by mouth daily.      [name] VITAMIN E 400 UNITS PO CAPS Oral Take 400 Units by mouth daily.      . ALBUTEROL SULFATE HFA 108 (90 BASE) MCG/ACT IN AERS Inhalation Inhale 2 puffs into the lungs every 6 (six) hours as needed. Shortness of breath    . NYSTOP 100000 UNIT/GM EX POWD        BP 148/76  Pulse 91  Temp(Src) 98 F (36.7 C) (Oral)  Resp 18  Ht 5' 5"  (1.651 m)  Wt 360 lb (163.295 kg)  BMI 59.91 kg/m2  SpO2 100%  Physical Exam  Nursing note and vitals reviewed. Constitutional: She is oriented to person, place, and time. She appears well-developed and well-nourished. No distress.  HENT:  Head: Normocephalic and atraumatic.  Eyes: EOM are normal. Pupils are equal, round, and reactive to light.  Neck: Normal range of motion. Neck supple. No tracheal deviation present.    Cardiovascular: Normal rate.   Pulmonary/Chest: Effort normal. No respiratory distress.  Abdominal: Soft. She exhibits no distension.  Musculoskeletal: Normal range of motion.       Left lateral proximal ankle small punctate venous bleed. Nl left pt and dp pulses.  Neurological: She is alert and oriented to person, place, and time.  Skin: Skin is warm and dry.  Psychiatric: She has a normal mood and affect. Her behavior is normal.    ED Course  Procedures (including critical care time)  DIAGNOSTIC STUDIES: Oxygen Saturation is 100% on room air, normal by my interpretation.    COORDINATION OF CARE:  LACERATION REPAIR PROCEDURE NOTE The patient's identification was confirmed and consent was obtained. This procedure was performed by Hoy Morn, MD at 11:44 PM. Site: Left lateral proximal ankle Sterile procedures observed Anesthetic used (type and amt): 2% lidocaine with epi Suture type/size:4-0 prolene  Length:0.25cm # of Sutures: 1 Technique: figure 8 Complexity: simple Antibx ointment applied: none Tetanus UTD or ordered: utd  Site anesthetized, irrigated with NS, explored without evidence of foreign body, wound well approximated, site covered with dry, sterile dressing.  Patient tolerated procedure well without complications. Instructions for care discussed verbally and patient provided with additional written instructions for homecare and f/u.   Labs Reviewed  GLUCOSE, CAPILLARY - Abnormal; Notable for the following:    Glucose-Capillary 143 (*)    All other components within normal limits   No results found.   1. Bleeding from varicose vein       MDM  contolled at bedside with lidocaine with epi and figure 8 stitch      I personally performed the services described in this documentation, which was scribed in my presence. The recorded information has been reviewed and considered.    Hoy Morn, MD 06/30/11 [phone]

## 2011-07-04 ENCOUNTER — Other Ambulatory Visit (HOSPITAL_COMMUNITY): Payer: Self-pay | Admitting: Family Medicine

## 2011-07-04 DIAGNOSIS — Z139 Encounter for screening, unspecified: Secondary | ICD-10-CM

## 2011-07-05 ENCOUNTER — Ambulatory Visit (HOSPITAL_COMMUNITY)
Admission: RE | Admit: 2011-07-05 | Discharge: 2011-07-05 | Disposition: A | Discharge status: Home / Self Care | Payer: Medicare Other | Source: Ambulatory Visit | Attending: Family Medicine | Admitting: Family Medicine

## 2011-07-05 DIAGNOSIS — Z1231 Encounter for screening mammogram for malignant neoplasm of breast: Secondary | ICD-10-CM | POA: Insufficient documentation

## 2011-07-05 DIAGNOSIS — Z139 Encounter for screening, unspecified: Secondary | ICD-10-CM

## 2011-07-07 ENCOUNTER — Ambulatory Visit (HOSPITAL_COMMUNITY): Payer: Medicare Other

## 2011-07-10 ENCOUNTER — Emergency Department (HOSPITAL_COMMUNITY)
Admission: EM | Admit: 2011-07-10 | Discharge: 2011-07-10 | Disposition: A | Discharge status: Home / Self Care | Payer: Medicare Other | Attending: Emergency Medicine | Admitting: Emergency Medicine

## 2011-07-10 ENCOUNTER — Encounter (HOSPITAL_COMMUNITY): Payer: Self-pay

## 2011-07-10 DIAGNOSIS — Z79899 Other long term (current) drug therapy: Secondary | ICD-10-CM | POA: Insufficient documentation

## 2011-07-10 DIAGNOSIS — Z4802 Encounter for removal of sutures: Secondary | ICD-10-CM | POA: Insufficient documentation

## 2011-07-10 DIAGNOSIS — Z5189 Encounter for other specified aftercare: Secondary | ICD-10-CM

## 2011-07-10 DIAGNOSIS — K219 Gastro-esophageal reflux disease without esophagitis: Secondary | ICD-10-CM | POA: Insufficient documentation

## 2011-07-10 DIAGNOSIS — I10 Essential (primary) hypertension: Secondary | ICD-10-CM | POA: Insufficient documentation

## 2011-07-10 DIAGNOSIS — J449 Chronic obstructive pulmonary disease, unspecified: Secondary | ICD-10-CM | POA: Insufficient documentation

## 2011-07-10 DIAGNOSIS — J4489 Other specified chronic obstructive pulmonary disease: Secondary | ICD-10-CM | POA: Insufficient documentation

## 2011-07-10 DIAGNOSIS — E079 Disorder of thyroid, unspecified: Secondary | ICD-10-CM | POA: Insufficient documentation

## 2011-07-10 DIAGNOSIS — E119 Type 2 diabetes mellitus without complications: Secondary | ICD-10-CM | POA: Insufficient documentation

## 2011-07-10 NOTE — ED Notes (Signed)
Dressing applied to left lower leg to prevent bleeding.

## 2011-07-10 NOTE — ED Notes (Signed)
Pt presents with 1 stitch to left lower leg that needs to be removed. Pt had stitch placed on 06/29/2011.

## 2011-07-10 NOTE — ED Notes (Addendum)
See triage note for assessment and c/c.

## 2011-07-10 NOTE — ED Notes (Signed)
Pt a/ox4. Resp even and unlabored. NAD at this time. D/C instructions reviewed with pt. Pt verbalized understanding. Pt ambulated to lobby with steady gate.  

## 2011-07-10 NOTE — ED Provider Notes (Signed)
History     CSN: [id number]  Arrival date & time 07/10/11  1220   None     Chief Complaint  Patient presents with  . Suture / Staple Removal    (Consider location/radiation/quality/duration/timing/severity/associated sxs/prior treatment) HPI Comments: Pt was treated for a bleeding varicose vein a few days ago. She presents now for recheck and suture removal. A there has been no further bleeding present. There's been no redness or drainage from the sutured varicose vein area.  Patient is a 56 y.o. female presenting with suture removal. The history is provided by the patient.  Suture / Staple Removal     Past Medical History  Diagnosis Date  . Type 2 diabetes mellitus   . Essential hypertension, benign   . GERD (gastroesophageal reflux disease)   . Edema   . Morbid obesity   . COPD (chronic obstructive pulmonary disease)   . Thyroid disease   . Asthma   . Arthritis     Past Surgical History  Procedure Date  . Thyroidectomy 2009  . Cholecystectomy 1996  . Tonsillectomy 1958  . Foot surgery 2000  . Abdominal hysterectomy 1985  . Exploratory laparotomy 2003  . Umbilical hernia repair 3790  . Debridement of abdominal wound 2011    No family history on file.  History  Substance Use Topics  . Smoking status: Never Smoker   . Smokeless tobacco: Never Used  . Alcohol Use: No    OB History    Grav Para Term Preterm Abortions TAB SAB Ect Mult Living                  Review of Systems  Respiratory: Positive for shortness of breath.   Gastrointestinal: Positive for abdominal pain.  Musculoskeletal: Positive for myalgias and arthralgias.  Skin:       Varicose veins    Allergies  Biaxin; Clindamycin/lincomycin; Penicillins; and Neosporin  Home Medications   Current Outpatient Rx  Name Route Sig Dispense Refill  . ALBUTEROL SULFATE HFA 108 (90 BASE) MCG/ACT IN AERS Inhalation Inhale 2 puffs into the lungs every 6 (six) hours as needed. Shortness of breath     . ALPRAZOLAM 0.5 MG PO TABS Oral Take 0.5 mg by mouth 3 (three) times daily.     . ASPIRIN 81 MG PO TBEC Oral Take 81 mg by mouth daily.      . BUPROPION HCL ER (SR) 150 MG PO TB12  150 mg 2 (two) times daily.     . CELEBREX 200 MG PO CAPS  200 mg daily.     . COLESEVELAM HCL 625 MG PO TABS Oral Take 1,875 mg by mouth 2 (two) times daily with a meal. 3 tabs am 3 tabs pm     . DILTIAZEM HCL ER COATED BEADS 240 MG PO CP24 Oral Take 1 capsule (240 mg total) by mouth daily. 30 capsule 11    Increase in dose from 140m to 2429mdaily  . DULOXETINE HCL 60 MG PO CPEP Oral Take 60 mg by mouth 2 (two) times daily.     . FUROSEMIDE 80 MG PO TABS Oral Take 80 mg by mouth daily.      . Marland KitchenLIMEPIRIDE 4 MG PO TABS Oral Take 4 mg by mouth daily.     . Marland KitchenEVOTHYROXINE SODIUM 300 MCG PO TABS Oral Take 300 mcg by mouth daily.      . Marland KitchenIOTHYRONINE SODIUM 5 MCG PO TABS Oral Take 5 mcg by mouth daily.    .[name]  LISINOPRIL 40 MG PO TABS Oral Take 1 tablet (40 mg total) by mouth daily. 30 tablet 3  . METFORMIN HCL 500 MG PO TABS Oral Take 500 mg by mouth 2 (two) times daily.     . NYSTOP 100000 UNIT/GM EX POWD      . SIMVASTATIN 20 MG PO TABS Oral Take 20 mg by mouth daily.     [name] VITAMIN D 400 UNITS PO TABS Oral Take 400 Units by mouth daily.      [name] VITAMIN E 400 UNITS PO CAPS Oral Take 400 Units by mouth daily.        BP 129/53  Pulse 92  Temp(Src) 98.3 F (36.8 C) (Oral)  Resp 20  Ht 5' 5"  (1.651 m)  Wt 360 lb (163.295 kg)  BMI 59.91 kg/m2  SpO2 99%  Physical Exam  Nursing note and vitals reviewed. Constitutional: She is oriented to person, place, and time. She appears well-developed and well-nourished.  Non-toxic appearance.  HENT:  Head: Normocephalic.  Right Ear: Tympanic membrane and external ear normal.  Left Ear: Tympanic membrane and external ear normal.  Eyes: EOM and lids are normal. Pupils are equal, round, and reactive to light.  Neck: Normal range of motion. Neck supple. Carotid bruit is not  present.  Cardiovascular: Normal rate, regular rhythm, normal heart sounds, intact distal pulses and normal pulses.   Pulmonary/Chest: Breath sounds normal. No respiratory distress.  Abdominal: Soft. Bowel sounds are normal. There is no tenderness. There is no guarding.  Musculoskeletal: Normal range of motion.       Suture in the left lateral lower leg is intact. No bleeding from the varicose vein.  Lymphadenopathy:       Head (right side): No submandibular adenopathy present.       Head (left side): No submandibular adenopathy present.    She has no cervical adenopathy.  Neurological: She is alert and oriented to person, place, and time. She has normal strength. No cranial nerve deficit or sensory deficit.  Skin: Skin is warm and dry.  Psychiatric: She has a normal mood and affect. Her speech is normal.    ED Course  Procedures (including critical care time)  Labs Reviewed - No data to display No results found.   Dx: Wound check, suture removal   MDM  I have reviewed nursing notes, vital signs, and all appropriate lab and imaging results for this patient.        Lenox Ahr, [place] 07/12/11 [phone]

## 2011-07-12 NOTE — ED Provider Notes (Signed)
Medical screening examination/treatment/procedure(s) were performed by non-physician practitioner and as supervising physician I was immediately available for consultation/collaboration. Rolland Porter, MD, [facility]   Janice Norrie, MD 07/12/11 [phone]

## 2011-09-05 ENCOUNTER — Ambulatory Visit (HOSPITAL_COMMUNITY)
Admission: RE | Admit: 2011-09-05 | Discharge: 2011-09-05 | Disposition: A | Discharge status: Home / Self Care | Payer: Medicare Other | Source: Ambulatory Visit | Attending: Family Medicine | Admitting: Family Medicine

## 2011-09-05 ENCOUNTER — Other Ambulatory Visit (HOSPITAL_COMMUNITY): Payer: Self-pay | Admitting: Family Medicine

## 2011-09-05 DIAGNOSIS — R05 Cough: Secondary | ICD-10-CM

## 2011-09-05 DIAGNOSIS — I10 Essential (primary) hypertension: Secondary | ICD-10-CM

## 2011-09-05 DIAGNOSIS — R059 Cough, unspecified: Secondary | ICD-10-CM | POA: Insufficient documentation

## 2011-09-24 ENCOUNTER — Encounter (HOSPITAL_COMMUNITY): Payer: Self-pay | Admitting: Emergency Medicine

## 2011-09-24 ENCOUNTER — Emergency Department (HOSPITAL_COMMUNITY): Payer: Medicare Other

## 2011-09-24 ENCOUNTER — Emergency Department (HOSPITAL_COMMUNITY)
Admission: EM | Admit: 2011-09-24 | Discharge: 2011-09-24 | Disposition: A | Discharge status: Home / Self Care | Payer: Medicare Other | Attending: Emergency Medicine | Admitting: Emergency Medicine

## 2011-09-24 ENCOUNTER — Other Ambulatory Visit: Payer: Self-pay

## 2011-09-24 DIAGNOSIS — M7989 Other specified soft tissue disorders: Secondary | ICD-10-CM | POA: Insufficient documentation

## 2011-09-24 DIAGNOSIS — R0602 Shortness of breath: Secondary | ICD-10-CM | POA: Insufficient documentation

## 2011-09-24 DIAGNOSIS — E119 Type 2 diabetes mellitus without complications: Secondary | ICD-10-CM | POA: Insufficient documentation

## 2011-09-24 DIAGNOSIS — K219 Gastro-esophageal reflux disease without esophagitis: Secondary | ICD-10-CM | POA: Insufficient documentation

## 2011-09-24 DIAGNOSIS — I10 Essential (primary) hypertension: Secondary | ICD-10-CM | POA: Insufficient documentation

## 2011-09-24 DIAGNOSIS — J4489 Other specified chronic obstructive pulmonary disease: Secondary | ICD-10-CM | POA: Insufficient documentation

## 2011-09-24 DIAGNOSIS — J449 Chronic obstructive pulmonary disease, unspecified: Secondary | ICD-10-CM | POA: Insufficient documentation

## 2011-09-24 LAB — BASIC METABOLIC PANEL
Chloride: 100 mEq/L (ref 96–112)
Creatinine, Ser: 0.6 mg/dL (ref 0.50–1.10)
GFR calc Af Amer: >90 mL/min (ref >90)
GFR calc non Af Amer: >90 mL/min (ref >90)
Potassium: 3.6 mEq/L (ref 3.5–5.1)

## 2011-09-24 LAB — POCT I-STAT, CHEM 8
Creatinine, Ser: 0.8 mg/dL (ref 0.50–1.10)
Glucose, Bld: 163 mg/dL — ABNORMAL HIGH (ref 70–99)
HCT: 32 % — ABNORMAL LOW (ref 36.0–46.0)
Hemoglobin: 10.9 g/dL — ABNORMAL LOW (ref 12.0–15.0)
Potassium: 3.6 mEq/L (ref 3.5–5.1)
Sodium: 140 mEq/L (ref 135–145)
TCO2: 26 mmol/L (ref 0–100)

## 2011-09-24 LAB — POCT I-STAT TROPONIN I: Troponin i, poc: 0.01 ng/mL (ref 0.00–0.08)

## 2011-09-24 LAB — D-DIMER, QUANTITATIVE: D-Dimer, Quant: 0.6 ug/mL-FEU — ABNORMAL HIGH (ref 0.00–0.48)

## 2011-09-24 LAB — CBC
MCHC: 32.9 g/dL (ref 30.0–36.0)
Platelets: 105 10*3/uL — ABNORMAL LOW (ref 150–400)
RDW: 15.1 % (ref 11.5–15.5)
WBC: 2.3 10*3/uL — ABNORMAL LOW (ref 4.0–10.5)

## 2011-09-24 LAB — DIFFERENTIAL
Basophils Absolute: 0 10*3/uL (ref 0.0–0.1)
Basophils Relative: 0 % (ref 0–1)
Lymphocytes Relative: 29 % (ref 12–46)
Neutro Abs: 1.3 10*3/uL — ABNORMAL LOW (ref 1.7–7.7)

## 2011-09-24 MED ORDER — ENOXAPARIN SODIUM 150 MG/ML ~~LOC~~ SOLN
1.0000 mg/kg | Freq: Once | SUBCUTANEOUS | Status: AC
  Administered 2011-09-24: 165 mg via SUBCUTANEOUS
  Filled 2011-09-24: qty 2

## 2011-09-24 MED ORDER — IBUPROFEN 800 MG PO TABS: 800.0000 mg | ORAL_TABLET | Freq: Three times a day (TID) | ORAL | Status: AC

## 2011-09-24 MED ORDER — HYDROCODONE-ACETAMINOPHEN 5-325 MG PO TABS: 2.0000 | ORAL_TABLET | ORAL | Status: AC | PRN

## 2011-09-24 MED ORDER — IOHEXOL 350 MG/ML SOLN
100.0000 mL | Freq: Once | INTRAVENOUS | Status: AC | PRN
  Administered 2011-09-24: 100 mL via INTRAVENOUS

## 2011-09-24 NOTE — ED Provider Notes (Signed)
History    Scribed for Ezequiel Essex, MD, the patient was seen in room APA14/APA14. This chart was scribed by Lyndee Hensen.   CSN: [id number]  Arrival date & time 09/24/11  1519   First MD Initiated Contact with Patient 09/24/11 1527      Chief Complaint  Patient presents with  . Leg Swelling    (Consider location/radiation/quality/duration/timing/severity/associated sxs/prior treatment) HPI  Pt was seen at 3:32 PM   [name] [name] is a 56 y.o. female who presents to the Emergency Department complaining of persistent left calf swelling and pain.  Patient states she notices swelling this morning. There is no history of DVT.  Patient with shortness of breath while walking around but states she has asthma..  Nothing taken for pain.  Patient with diabetes mellitus.  Patient had heart cath last year that was normal.  Patient denies history of heart problems.      PCP Leonides Grills, MD, MD    Past Medical History  Diagnosis Date  . Type 2 diabetes mellitus   . Essential hypertension, benign   . GERD (gastroesophageal reflux disease)   . Edema   . Morbid obesity   . COPD (chronic obstructive pulmonary disease)   . Thyroid disease   . Asthma   . Arthritis     Past Surgical History  Procedure Date  . Thyroidectomy 2009  . Cholecystectomy 1996  . Tonsillectomy 1958  . Foot surgery 2000  . Abdominal hysterectomy 1985  . Exploratory laparotomy 2003  . Umbilical hernia repair 7829  . Debridement of abdominal wound 2011    No family history on file.  History  Substance Use Topics  . Smoking status: Never Smoker   . Smokeless tobacco: Never Used  . Alcohol Use: No    OB History    Grav Para Term Preterm Abortions TAB SAB Ect Mult Living                  Review of Systems  All other systems reviewed and are negative.  Remaining review of systems negative except as noted in the HPI.    Allergies  Biaxin; Clindamycin/lincomycin; Penicillins; and  Neosporin  Home Medications   Current Outpatient Rx  Name Route Sig Dispense Refill  . ALBUTEROL SULFATE HFA 108 (90 BASE) MCG/ACT IN AERS Inhalation Inhale 2 puffs into the lungs every 6 (six) hours as needed. Shortness of breath    . ASPIRIN 81 MG PO TBEC Oral Take 81 mg by mouth daily.      . BUPROPION HCL ER (SR) 150 MG PO TB12  150 mg 2 (two) times daily.     . CELEBREX 200 MG PO CAPS  200 mg daily.     . COLESEVELAM HCL 625 MG PO TABS Oral Take 1,875 mg by mouth 2 (two) times daily with a meal. 3 tabs am 3 tabs pm     . DILTIAZEM HCL ER COATED BEADS 240 MG PO CP24 Oral Take 1 capsule (240 mg total) by mouth daily. 30 capsule 11    Increase in dose from 120m to 2498mdaily  . DULOXETINE HCL 60 MG PO CPEP Oral Take 120 mg by mouth daily.     . FUROSEMIDE 80 MG PO TABS Oral Take 80 mg by mouth daily.      . Marland KitchenLIMEPIRIDE 4 MG PO TABS Oral Take 4 mg by mouth daily.     . Marland KitchenEVOTHYROXINE SODIUM 300 MCG PO TABS Oral Take 300 mcg by mouth  daily.      [name] LIOTHYRONINE SODIUM 5 MCG PO TABS Oral Take 5 mcg by mouth daily.    [name] LISINOPRIL 40 MG PO TABS Oral Take 1 tablet (40 mg total) by mouth daily. 30 tablet 3  . METFORMIN HCL 500 MG PO TABS Oral Take 500 mg by mouth 2 (two) times daily.     . NYSTOP 100000 UNIT/GM EX POWD Topical Apply 100,000 g topically as needed.     [name] SIMVASTATIN 20 MG PO TABS Oral Take 20 mg by mouth daily.     [name] ALPRAZOLAM 0.5 MG PO TABS Oral Take 0.5 mg by mouth 3 (three) times daily.       BP 140/63  Pulse 92  Temp(Src) 98.1 F (36.7 C) (Oral)  Resp 20  Ht 5' 4"  (1.626 m)  Wt 359 lb (162.841 kg)  BMI 61.62 kg/m2  SpO2 98%  Physical Exam  Nursing note and vitals reviewed. Constitutional: She is oriented to person, place, and time. She appears well-developed. No distress.  HENT:  Head: Normocephalic and atraumatic.  Eyes: Conjunctivae and EOM are normal.  Neck: Neck supple.  Cardiovascular: Normal rate, regular rhythm and normal heart sounds.   Pulses:       Dorsalis pedis pulses are 2+ on the right side, and 2+ on the left side.  Pulmonary/Chest: Effort normal and breath sounds normal. No respiratory distress.  Abdominal: There is no tenderness. There is no rebound and no guarding.  Musculoskeletal: Normal range of motion. She exhibits edema and tenderness.       Left calf is swollen and tender to palpation, no erythema, multiple superficial scratches and abrasions, good strength and good pulses, right calf is non tender   Neurological: She is alert and oriented to person, place, and time. No sensory deficit. Coordination normal.  Skin: Skin is warm and dry. No erythema.  Psychiatric: She has a normal mood and affect. Her behavior is normal.    ED Course  Procedures (including critical care time)   DIAGNOSTIC STUDIES: Oxygen Saturation is 98% on room air, normal by my interpretation.    EKG:  Date: 09/24/2011  Rate: 95  Rhythm: normal sinus rhythm  QRS Axis: normal  Intervals: normal  ST/T Wave abnormalities: normal  Conduction Disutrbances:none  Narrative Interpretation:   Old EKG Reviewed: unchanged  COORDINATION OF CARE: 3:37 PM  Physical exam complete.  Will ultrasound lower left leg and CT chest.   4:30 PM  Recheck.  Patient not able to have ultrasound tonight.  Awaiting CT Chest findings.   4:45 PM  Lovenox ordered.       LABS / RADIOLOGY:   Labs Reviewed  CBC - Abnormal; Notable for the following:    WBC 2.3 (*)    RBC 3.78 (*)    Hemoglobin 10.9 (*)    HCT 33.1 (*)    Platelets 105 (*)    All other components within normal limits  DIFFERENTIAL - Abnormal; Notable for the following:    Neutro Abs 1.3 (*)    Monocytes Relative 14 (*)    All other components within normal limits  D-DIMER, QUANTITATIVE - Abnormal; Notable for the following:    D-Dimer, Quant 0.60 (*)    All other components within normal limits  BASIC METABOLIC PANEL - Abnormal; Notable for the following:    Glucose, Bld 168 (*)    All other  components within normal limits  POCT I-STAT, CHEM 8 - Abnormal; Notable for the following:  Glucose, Bld 163 (*)    Hemoglobin 10.9 (*)    HCT 32.0 (*)    All other components within normal limits  POCT I-STAT TROPONIN I   Ct Angio Chest W/cm &/or Wo Cm  09/24/2011  *RADIOLOGY REPORT*  Clinical Data: Left calf swelling and pain.  Shortness of breath. Diabetes.  Hypertension.  COPD.  Asthma.  CT ANGIOGRAPHY CHEST  Technique:  Multidetector CT imaging of the chest using the standard protocol during bolus administration of intravenous contrast. Multiplanar reconstructed images including MIPs were obtained and reviewed to evaluate the vascular anatomy.  Contrast: 134m OMNIPAQUE IOHEXOL 350 MG/ML SOLN  Comparison: 09/05/2011; 06/28/2007  Findings: Technical factors related to patient body habitus reduce diagnostic sensitivity and specificity.  Prominent mediastinal adipose tissue noted.  Small paratracheal and mediastinal lymph nodes are not pathologically enlarged by size criteria.  There is mild bony bridging between the posteromedial portions the right fifth and sixth ribs.  Mild cardiomegaly noted.  No filling defect is identified in the pulmonary arterial tree to suggest pulmonary embolus.  Mild airway thickening may reflect bronchitis or reactive airways disease.  Lungs appear otherwise clear.  IMPRESSION: 1.  Mild airway thickening potentially reflecting bronchitis or reactive airways disease.  2.  No embolus is observed.  Negative predictive value is reduced due to body habitus.  Original Report Authenticated By: WCarron Curie M.D.           MDM  Left calf pain and swelling for the past 2 days. No trauma. No history of blood clots. Patient is morbidly obese and endorses some shortness of breath which she treats her asthma. She has no tachycardia or hypoxia.  Ultrasound scheduled for tomorrow morning at 9 AM. We'll dose Lovenox. CT negative for pulmonary embolism. Patient to  followup at 9 AM tomorrow for ultrasound of her leg. She is given Lovenox.      MEDICATIONS GIVEN IN THE E.D. Scheduled Meds:    . enoxaparin (LOVENOX) injection  1 mg/kg Subcutaneous Once   Continuous Infusions:      IMPRESSION: No diagnosis found.   NEW MEDICATIONS: New Prescriptions   No medications on file      I personally performed the services described in this documentation, which was scribed in my presence.  The recorded information has been reviewed and considered.       SEzequiel Essex MD 09/24/11 [phone]

## 2011-09-24 NOTE — Discharge Instructions (Signed)
Edema Return to the ED at 9 AM tomorrow for an ultrasound of her leg. Edema is an abnormal build-up of fluids in tissues. Because this is partly dependent on gravity (water flows to the lowest place), it is more common in the leg sand thighs (lower extremities). It is also common in the looser tissues, like around the eyes. Painless swelling of the feet and ankles is common and increases as a person ages. It may affect both legs and may include the calves or even thighs. When squeezed, the fluid may move out of the affected area and may leave a dent for a few moments. CAUSES   Prolonged standing or sitting in one place for extended periods of time. Movement helps pump tissue fluid into the veins, and absence of movement prevents this, resulting in edema.   Varicose veins. The valves in the veins do not work as well as they should. This causes fluid to leak into the tissues.   Fluid and salt overload.   Injury, burn, or surgery to the leg, ankle, or foot, may damage veins and allow fluid to leak out.   Sunburn damages vessels. Leaky vessels allow fluid to go out into the sunburned tissues.   Allergies (from insect bites or stings, medications or chemicals) cause swelling by allowing vessels to become leaky.   Protein in the blood helps keep fluid in your vessels. Low protein, as in malnutrition, allows fluid to leak out.   Hormonal changes, including pregnancy and menstruation, cause fluid retention. This fluid may leak out of vessels and cause edema.   Medications that cause fluid retention. Examples are sex hormones, blood pressure medications, steroid treatment, or anti-depressants.   Some illnesses cause edema, especially heart failure, kidney disease, or liver disease.   Surgery that cuts veins or lymph nodes, such as surgery done for the heart or for breast cancer, may result in edema.  DIAGNOSIS  Your caregiver is usually easily able to determine what is causing your swelling (edema)  by simply asking what is wrong (getting a history) and examining you (doing a physical). Sometimes x-rays, EKG (electrocardiogram or heart tracing), and blood work may be done to evaluate for underlying medical illness. TREATMENT  General treatment includes:  Leg elevation (or elevation of the affected body part).   Restriction of fluid intake.   Prevention of fluid overload.   Compression of the affected body part. Compression with elastic bandages or support stockings squeezes the tissues, preventing fluid from entering and forcing it back into the blood vessels.   Diuretics (also called water pills or fluid pills) pull fluid out of your body in the form of increased urination. These are effective in reducing the swelling, but can have side effects and must be used only under your caregiver's supervision. Diuretics are appropriate only for some types of edema.  The specific treatment can be directed at any underlying causes discovered. Heart, liver, or kidney disease should be treated appropriately. HOME CARE INSTRUCTIONS   Elevate the legs (or affected body part) above the level of the heart, while lying down.   Avoid sitting or standing still for prolonged periods of time.   Avoid putting anything directly under the knees when lying down, and do not wear constricting clothing or garters on the upper legs.   Exercising the legs causes the fluid to work back into the veins and lymphatic channels. This may help the swelling go down.   The pressure applied by elastic bandages or support stockings  can help reduce ankle swelling.   A low-salt diet may help reduce fluid retention and decrease the ankle swelling.   Take any medications exactly as prescribed.  SEEK MEDICAL CARE IF:  Your edema is not responding to recommended treatments. SEEK IMMEDIATE MEDICAL CARE IF:   You develop shortness of breath or chest pain.   You cannot breathe when you lay down; or if, while lying down, you  have to get up and go to the window to get your breath.   You are having increasing swelling without relief from treatment.   You develop a fever over 102 F (38.9 C).   You develop pain or redness in the areas that are swollen.   Tell your caregiver right away if you have gained 3 lb/1.4 kg in 1 day or 5 lb/2.3 kg in a week.  MAKE SURE YOU:   Understand these instructions.   Will watch your condition.   Will get help right away if you are not doing well or get worse.  Document Released: 06/06/2005 Document Revised: 05/26/2011 Document Reviewed: 01/23/2008 [facility] Patient Information 2012 [place], [place].

## 2011-09-24 NOTE — ED Notes (Signed)
Pt thinks her left calf is swollen larger than normal since yesterday, was at [place] and decided she needed to get area checked out.

## 2011-09-24 NOTE — ED Notes (Signed)
Pt with left calf swelling since yesterday

## 2011-09-25 ENCOUNTER — Ambulatory Visit (HOSPITAL_COMMUNITY)
Admit: 2011-09-25 | Discharge: 2011-09-25 | Disposition: A | Discharge status: Home / Self Care | Payer: Medicare Other | Source: Ambulatory Visit | Attending: Emergency Medicine | Admitting: Emergency Medicine

## 2011-09-25 DIAGNOSIS — M79609 Pain in unspecified limb: Secondary | ICD-10-CM | POA: Insufficient documentation

## 2011-09-25 DIAGNOSIS — M7989 Other specified soft tissue disorders: Secondary | ICD-10-CM | POA: Insufficient documentation

## 2011-09-25 NOTE — ED Provider Notes (Signed)
9:59 AM 09/25/11  Patient returned today for outpatient ultrasound of the left LE.  Results were negative for DVT.  I advised pt to elevate her leg and follow-up with her PMD tomorrow.  She verbalized understanding and agreed to instructions  [name] [name] L. [place], [place] 09/25/11 1000

## 2011-09-25 NOTE — ED Provider Notes (Signed)
Medical screening examination/treatment/procedure(s) were performed by non-physician practitioner and as supervising physician I was immediately available for consultation/collaboration.   Ezequiel Essex, MD 09/25/11 [phone]

## 2011-11-08 ENCOUNTER — Other Ambulatory Visit (HOSPITAL_COMMUNITY): Payer: Self-pay | Admitting: Family Medicine

## 2011-11-08 ENCOUNTER — Ambulatory Visit (HOSPITAL_COMMUNITY)
Admission: RE | Admit: 2011-11-08 | Discharge: 2011-11-08 | Disposition: A | Discharge status: Home / Self Care | Payer: Medicare Other | Source: Ambulatory Visit | Attending: Family Medicine | Admitting: Family Medicine

## 2011-11-08 DIAGNOSIS — R05 Cough: Secondary | ICD-10-CM

## 2011-11-08 DIAGNOSIS — R059 Cough, unspecified: Secondary | ICD-10-CM | POA: Insufficient documentation

## 2011-11-09 ENCOUNTER — Other Ambulatory Visit (HOSPITAL_COMMUNITY): Payer: Self-pay | Admitting: Family Medicine

## 2011-11-09 DIAGNOSIS — R05 Cough: Secondary | ICD-10-CM

## 2011-11-11 ENCOUNTER — Ambulatory Visit (HOSPITAL_COMMUNITY)
Admission: RE | Admit: 2011-11-11 | Discharge: 2011-11-11 | Disposition: A | Discharge status: Home / Self Care | Payer: Medicare Other | Source: Ambulatory Visit | Attending: Family Medicine | Admitting: Family Medicine

## 2011-11-11 DIAGNOSIS — R05 Cough: Secondary | ICD-10-CM

## 2011-11-11 DIAGNOSIS — R059 Cough, unspecified: Secondary | ICD-10-CM | POA: Insufficient documentation

## 2011-11-11 DIAGNOSIS — R918 Other nonspecific abnormal finding of lung field: Secondary | ICD-10-CM | POA: Insufficient documentation

## 2011-11-11 MED ORDER — IOHEXOL 300 MG/ML  SOLN
100.0000 mL | Freq: Once | INTRAMUSCULAR | Status: AC | PRN
  Administered 2011-11-11: 100 mL via INTRAVENOUS

## 2011-11-18 ENCOUNTER — Other Ambulatory Visit: Payer: Self-pay | Admitting: Cardiology

## 2012-01-02 ENCOUNTER — Ambulatory Visit (HOSPITAL_COMMUNITY)
Admission: RE | Admit: 2012-01-02 | Discharge: 2012-01-02 | Disposition: A | Discharge status: Home / Self Care | Payer: Medicare Other | Source: Ambulatory Visit | Attending: Family Medicine | Admitting: Family Medicine

## 2012-01-02 ENCOUNTER — Other Ambulatory Visit (HOSPITAL_COMMUNITY): Payer: Self-pay | Admitting: Family Medicine

## 2012-01-02 DIAGNOSIS — M25519 Pain in unspecified shoulder: Secondary | ICD-10-CM | POA: Insufficient documentation

## 2012-01-02 DIAGNOSIS — M25529 Pain in unspecified elbow: Secondary | ICD-10-CM | POA: Insufficient documentation

## 2012-01-09 ENCOUNTER — Other Ambulatory Visit (HOSPITAL_COMMUNITY): Payer: Self-pay | Admitting: Family Medicine

## 2012-01-09 DIAGNOSIS — M13119 Monoarthritis, not elsewhere classified, unspecified shoulder: Secondary | ICD-10-CM

## 2012-01-11 ENCOUNTER — Other Ambulatory Visit (HOSPITAL_COMMUNITY): Payer: Medicare Other

## 2012-02-08 ENCOUNTER — Encounter: Payer: Self-pay | Admitting: Orthopedic Surgery

## 2012-02-08 ENCOUNTER — Ambulatory Visit (INDEPENDENT_AMBULATORY_CARE_PROVIDER_SITE_OTHER): Payer: Medicare Other | Admitting: Orthopedic Surgery

## 2012-02-08 VITALS — BP 144/80 | Ht 64.0 in | Wt 338.0 lb

## 2012-02-08 DIAGNOSIS — M75120 Complete rotator cuff tear or rupture of unspecified shoulder, not specified as traumatic: Secondary | ICD-10-CM | POA: Insufficient documentation

## 2012-02-08 DIAGNOSIS — M19019 Primary osteoarthritis, unspecified shoulder: Secondary | ICD-10-CM | POA: Insufficient documentation

## 2012-02-08 NOTE — Patient Instructions (Addendum)
Surgery rotator cuff   Rotator Cuff Injury The rotator cuff is the collective set of muscles and tendons that make up the stabilizing unit of your shoulder. This unit holds in the ball of the humerus (upper arm bone) in the socket of the scapula (shoulder blade). Injuries to this stabilizing unit most commonly come from sports or activities that cause the arm to be moved repeatedly over the head. Examples of this include throwing, weight lifting, swimming, racquet sports, or an injury such as falling on your arm. Chronic (longstanding) irritation of this unit can cause inflammation (soreness), bursitis, and eventual damage to the tendons to the point of rupture (tear). An acute (sudden) injury of the rotator cuff can result in a partial or complete tear. You may need surgery with complete tears. Small or partial rotator cuff tears may be treated conservatively with temporary immobilization, exercises and rest. Physical therapy may be needed. HOME CARE INSTRUCTIONS    Apply ice to the injury for 15 to 20 minutes 3 to 4 times per day for the first 2 days. Put the ice in a plastic bag and place a towel between the bag of ice and your skin.   If you have a shoulder immobilizer (sling and straps), do not remove it for as long as directed by your caregiver or until you see a caregiver for a follow-up examination. If you need to remove it, move your arm as little as possible.   You may want to sleep on several pillows or in a recliner at night to lessen swelling and pain.   Only take over-the-counter or prescription medicines for pain, discomfort, or fever as directed by your caregiver.   Do simple hand squeezing exercises with a soft rubber ball to decrease hand swelling.  SEEK MEDICAL CARE IF:    Pain in your shoulder increases or new pain or numbness develops in your arm, hand, or fingers.   Your hand or fingers are colder than your other hand.  SEEK IMMEDIATE MEDICAL CARE IF:    Your arm, hand, or  fingers are numb or tingling.   Your arm, hand, or fingers are increasingly swollen and painful, or turn white or blue.  Document Released: 06/03/2000 Document Revised: 05/26/2011 Document Reviewed: 05/27/2008 [facility] Patient Information 2012 [place].Surgery for Rotator Cuff Tear with Rehab Rotator cuff surgery is only recommended for individuals who have experienced persistent disability for greater than 3 months of non-surgical (conservative) treatment. Surgery is not necessary but is recommended for individuals who experience difficulty completing daily activities or athletes who are unable to compete. Rotator cuff tears do not usually heal without surgical intervention. If left alone small rotator cuff tears usually become larger. Younger athletes who have a rotator cuff tear may be recommended for surgery without attempting conservative rehabilitation. The purpose of surgery is to regain function of the shoulder joint and eliminate pain associated with the injury. In addition to repairing the tendon tear, the surgery often removes a portion of the bony roof of the shoulder (acromion) as well as the chronically thickened and inflamed membrane below the acromion (subacromial bursa). REASONS NOT TO OPERATE    Infection of the shoulder.   Inability to complete a rehabilitation program.   Patients who have other conditions (emotional or psychological) conditions that contribute to their shoulder condition.  RISKS AND COMPLICATIONS  Infection.   Re-tear of the rotator cuff tendons or muscles.   Shoulder stiffness and/or weakness.   Inability to compete in athletics.  Acromioclavicular (AC) joint paint.   Risks of surgery: infection, bleeding, nerve damage, or damage to surrounding tissues.  TECHNIQUE There are different surgical procedures used to treat rotator cuff tears. The type of procedure depends on the extent of injury as well as the surgeon's preference. All of the  surgical techniques for rotator cuff tears have the same goal of repairing the torn tendon, removing part of the acromion, and removing the subacromial bursa. There are two main types of procedures: arthroscopic and open incision. Arthroscopic procedures are usually completed and you go home the same day as surgery (outpatient). These procedures use multiple small incisions in which tools and a video camera are placed to work on the shoulder. An electric shaver removes the bursa, then a power burr shaves down the portion of the acromion that places pressure on the rotator cuff. Finally the rotator cuff is sewed (sutured) back to the humeral head. Open incision procedures require a larger incision. The deltoid muscle is detached from the acromion and a ligament in the shoulder (coracoacromial) is cut in order for the surgeon to access the rotator cuff. The subacromial bursa is removed as well as part of the acromion to give the rotator cuff room to move freely. The torn tendon is then sutured to the humeral head. After the rotator cuff is repaired, then the deltoid is reattached and the incision is closed up.   RECOVERY    Post-operative care depends on the surgical technique and the preferences of your therapist.   Keep the wound clean and dry for the first 10 to 14 days after surgery.   Keep your shoulder and arm in the sling provided to you for as long as you have been instructed to.   You will be given pain medications by your caregiver.   Passive (without using muscles) shoulder movements may be begun immediately after surgery.   It is important to follow through with you rehabilitation program in order to have the best possible recovery.  RETURN TO SPORTS    The rehabilitation period will depend on the sport and position you play as well as the success of the operation.   The minimum recovery period is 6 months.   You must have regained complete shoulder motion and strength before  returning to sports.  SEEK IMMEDIATE MEDICAL CARE IF:    Any medications produce adverse side effects.   Any complications from surgery occur:   Pain, numbness, or coldness in the extremity operated upon.   Discoloration of the nail beds (they become blue or gray) of the extremity operated upon.   Signs of infections (fever, pain, inflammation, redness, or persistent bleeding).  EXERCISES   RANGE OF MOTION (ROM) AND STRETCHING EXERCISES - Rotator Cuff Tear, Surgery For These exercises may help you restore your elbow mobility once your physician has discontinued your immobilization period. Beginning these before your provider's approval may result in delayed healing. Your symptoms may resolve with or without further involvement from your physician, physical therapist or athletic trainer. While completing these exercises, remember:    Restoring tissue flexibility helps normal motion to return to the joints. This allows healthier, less painful movement and activity.   An effective stretch should be held for at least 30 seconds. A stretch should never be painful. You should only feel a gentle lengthening or release in the stretch.  ROM - Pendulum   Bend at the waist so that your right / left arm falls away from your body. Support  yourself with your opposite hand on a solid surface, such as a table or a countertop.   Your right / left arm should be perpendicular to the ground. If it is not perpendicular, you need to lean over farther. Relax the muscles in your right / left arm and shoulder as much as possible.   Gently sway your hips and trunk so they move your right / left arm without any use of your right / left shoulder muscles.   Progress your movements so that your right / left arm moves side to side, then forward and backward, and finally, both clockwise and counterclockwise.   Complete __________ repetitions in each direction. Many people use this exercise to relieve discomfort in  their shoulder as well as to gain range of motion.  Repeat __________ times. Complete this exercise __________ times per day. STRETCH - Flexion, Seated   Sit in a firm chair so that your right / left forearm can rest on a table or on a table or countertop. Your right / left elbow should rest below the height of your shoulder so that your shoulder feels supported and not tense or uncomfortable.   Keeping your right / left shoulder relaxed, lean forward at your waist, allowing your right / left hand to slide forward. Bend forward until you feel a moderate stretch in your shoulder, but before you feel an increase in your pain.   Hold __________ seconds. Slowly return to your starting position.  Repeat __________ times. Complete this exercise __________ times per day.   STRETCH - Flexion, Standing   Stand with good posture. With an underhand grip on your right / left and an overhand grip on the opposite hand, grasp a broomstick or cane so that your hands are a little more than shoulder-width apart.   Keeping your right / left elbow straight and shoulder muscles relaxed, push the stick with your opposite hand to raise your right / left arm in front of your body and then overhead. Raise your arm until you feel a stretch in your right / left shoulder, but before you have increased shoulder pain.   Try to avoid shrugging your right / left shoulder as your arm rises by keeping your shoulder blade tucked down and toward your mid-back spine. Hold __________ seconds.   Slowly return to the starting position.  Repeat __________ times. Complete this exercise __________ times per day.   STRETCH - Abduction, Supine   Stand with good posture. With an underhand grip on your right / left and an overhand grip on the opposite hand, grasp a broomstick or cane so that your hands are a little more than shoulder-width apart.   Keeping your right / left elbow straight and shoulder muscles relaxed, push the stick with  your opposite hand to raise your right / left arm out to the side of your body and then overhead. Raise your arm until you feel a stretch in your right / left shoulder, but before you have increased shoulder pain.   Try to avoid shrugging your right / left shoulder as your arm rises by keeping your shoulder blade tucked down and toward your mid-back spine. Hold __________ seconds.   Slowly return to the starting position.  Repeat __________ times. Complete this exercise __________ times per day.   ROM - Flexion, Active-Assisted  Lie on your back. You may bend your knees for comfort.   Grasp a broomstick or cane so your hands are about shoulder-width apart. Your  right / left hand should grip the end of the stick/cane so that your hand is positioned "thumbs-up," as if you were about to shake hands.   Using your healthy arm to lead, raise your right / left arm overhead until you feel a gentle stretch in your shoulder. Hold __________ seconds.   Use the stick/cane to assist in returning your right / left arm to its starting position.  Repeat __________ times. Complete this exercise __________ times per day.   STRETCH - External Rotation   Tuck a folded towel or small ball under your right / left upper arm. Grasp a broomstick or cane with an underhand grasp a little more than shoulder width apart. Bend your elbows to 90 degrees.   Stand with good posture or sit in a chair without arms.   Use your strong arm to push the stick across your body. Do not allow the towel or ball to fall. This will rotate your right / left arm away from your abdomen. Using the stick turn/rotate your hand and forearm away from your body. Hold __________ seconds.  Repeat __________ times. Complete this exercise __________ times per day.   STRENGTHENING EXERCISES - Rotator Cuff Tear, Surgery For These exercises may help you begin to restore your elbow strength in the initial stage of your rehabilitation. Your physician  will determine when you begin these exercises depending on the severity of your injury and the integrity of your repaired tissues. Beginning these before your provider's approval may result in delayed healing. While completing these exercises, remember:    Muscles can gain both the endurance and the strength needed for everyday activities through controlled exercises.   Complete these exercises as instructed by your physician, physical therapist or athletic trainer. Progress the resistance and repetitions only as guided.   You may experience muscle soreness or fatigue, but the pain or discomfort you are trying to eliminate should never worsen during these exercises. If this pain does worsen, stop and make certain you are following the directions exactly. If the pain is still present after adjustments, discontinue the exercise until you can discuss the trouble with your clinician.  STRENGTH - Shoulder Flexion, Isometric   With good posture and facing a wall, stand or sit about 4-6 inches away.   Keeping your right / left elbow straight, gently press the top of your fist into the wall. Increase the pressure gradually until you are pressing as hard as you can without shrugging your shoulder or increasing any shoulder discomfort.   Hold __________ seconds. Release the tension slowly. Relax your shoulder muscles completely before you do the next repetition.  Repeat __________ times. Complete this exercise __________ times per day.   STRENGTH - Shoulder Abductors, Isometric   With good posture, stand or sit about 4-6 inches from a wall with your right / left side facing the wall.   Bend your right / left elbow. Gently press your right / left elbow into the wall. Increase the pressure gradually until you are pressing as hard as you can without shrugging your shoulder or increasing any shoulder discomfort.   Hold __________ seconds.   Release the tension slowly. Relax your shoulder muscles completely  before you do the next repetition.  Repeat __________ times. Complete this exercise __________ times per day.   STRENGTH - Internal Rotators, Isometric   Keep your right / left elbow at your side and bend it 90 degrees.   Step into a door frame so that the  inside of your right / left wrist can press against the door frame without your upper arm leaving your side.   Gently press your right / left wrist into the door frame as if you were trying to draw the palm of your hand to your abdomen. Gradually increase the tension until you are pressing as hard as you can without shrugging your shoulder or increasing any shoulder discomfort.   Hold __________ seconds.   Release the tension slowly. Relax your shoulder muscles completely before you do the next repetition.  Repeat __________ times. Complete this exercise __________ times per day.   STRENGTH - External Rotators, Isometric   Keep your right / left elbow at your side and bend it 90 degrees.   Step into a door frame so that the outside of your right / left wrist can press against the door frame without your upper arm leaving your side.   Gently press your right / left wrist into the door frame as if you were trying to swing the back of your hand away from your abdomen. Gradually increase the tension until you are pressing as hard as you can without shrugging your shoulder or increasing any shoulder discomfort.   Hold __________ seconds.   Release the tension slowly. Relax your shoulder muscles completely before you do the next repetition.  Repeat __________ times. Complete this exercise __________ times per day.   Document Released: 06/06/2005 Document Revised: 05/26/2011 Document Reviewed: 09/18/2008 [facility] Patient Information 2012 [place].

## 2012-02-08 NOTE — Progress Notes (Signed)
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 She reports sharp, dull, throbbing, stabbing, burning 10 over 10, constant pain in the RIGHT shoulder that nothing makes better in every motion makes worse. She has lost motion in the shoulder. She has weakness catching, and she cannot do her routine activities.  She has a history of a coronary catheterization. Last May by Dr. Lia Foyer, and was told that her heart was fine. She does have some respiratory disease with COPD, type symptoms, including sleep apnea. She has a sleep apnea machine, but doesn't use it. Instead, she sleeps upright in a recliner.   The following portions of the patient's history were reviewed and updated as appropriate: allergies, current medications, past family history, past medical history, past social history, past surgical history and problem list.  Review of Systems the systems are reviewed with the following positive findings, chest pain, with palpitations, current, shortness of breath, wheezing, tightness snoring, sleep apnea, heartburn, constipation, diarrhea, frequency, and urgency. Skin rash itching and redness poor healing, multiple cat scratches. Dizziness, anxiety, and depression. Excessive thirst heat intolerance, seasonal allergies, blurred vision. Ordering of the eyes.  correction watering of the eyes.  Negative findings include weight loss or weight gain, fever or chills  Objective:    BP 144/80  Ht 5' 4"  (1.626 m)  Wt 338 lb (153.316 kg)  BMI 58.02 kg/m2         Vital signs are stable as recorded  General appearance is normal, obesity   The patient is alert and oriented x3  The patient's mood and affect are normal  Gait assessment: normal waddling  The cardiovascular exam reveals normal pulses  and temperature without edema swelling.  The lymphatic system is negative for palpable lymph nodes  The sensory exam is normal.  There are no pathologic reflexes.  Balance is normal.   Exam of the right shoulder  Inspection elevation of the RIGHT shoulder only to 90, requiring assistance to get the shoulder to 120. Painful range of motion arthropathy. Range of motion. Passive range of motion 120. The shoulder remained stable. The subscapular strength is normal. The external rotators are weak. The supraspinatus tendon. The skin is intact, but there are multiple scratches from her cat  Left shoulder FROM, NORMAL STRENGTH, NO INSTABILITY NO TENDERNESS   Lower extremity exam:   Inspection and palpation revealed no tenderness or abnormality in alignment in the lower extremities. Range of motion is full.  Strength is grade 5.   all joints are stable.   Imaging. Shoulder x-ray shows a.c. Joint arthrosis.  MRI done in an open unit did show some motion artifact, but the supraspinatus and infraspinatus tendon appeared to be torn with retraction to the humeral head. There is a.c. Joint arthrosis noted. Large effusion noted.   Assessment:    Right supraspinatus and infraspinatus tendon tear, complete with retraction. No atrophy    Plan:    Neurosurgeon distributed. a complete discussion was performed regarding possible treatment options. To simply get her back to her functional level, and cooking. It would require surgical repair. She is okay with doing this understanding that she will have him a three-month period where she cannot do any lifting and then a 6 month period total where she can't lift any pots. He'll take a year for  her shoulder to feel normal.  She understands that she is at high risk for respiratory problems during or after surgery, as well as diabetes, which may increase her infection. Wrist. We explained procedure, including the use of suture anchors and repair  of the cuff and removal of the distal clavicle.  You have been scheduled for rotator cuff surgery.  All surgeries carry some risk.  Remember you always have the option of continued nonsurgical treatment. However in this situation the risks vs. the benefits favor surgery as the best treatment option. The risks of the surgery includes the following but is not limited to bleeding, infection, pulmonary embolus, death from anesthesia, nerve injury vascular injury or need for further surgery, continued pain.  Specific to this procedure the following risks and complications are rare but possible Stiffness, pain, weakness, re tear.  I expect 9-12 months to fully recover from this procedure.

## 2012-02-09 ENCOUNTER — Telehealth: Payer: Self-pay | Admitting: Orthopedic Surgery

## 2012-02-09 NOTE — Telephone Encounter (Signed)
[place], [place], ph# [phone], regarding surgery, in-patient/admit date 02/27/12, [place] automated voice message system; left all pertinent information on secure voice mail for nurse reviewer, and per message, faxed clinicals to attention of pre-authorization review to fax # [phone], including CPT codes (1) 23420, diagnosis 727.61; (2) 23120, diagnosis [phone].  Follow up if no response by 02/15/12.

## 2012-02-10 ENCOUNTER — Encounter (HOSPITAL_COMMUNITY): Payer: Self-pay | Admitting: Pharmacy Technician

## 2012-02-15 ENCOUNTER — Telehealth: Payer: Self-pay | Admitting: Orthopedic Surgery

## 2012-02-15 NOTE — Telephone Encounter (Signed)
She has a history of a coronary catheterization. Last May by Dr. Lia Foyer, and was told that her heart was fine. She does have some respiratory disease with COPD, type symptoms, including sleep apnea. She has a sleep apnea machine, but doesn't use it. Instead, she sleeps upright in a recliner.  The following portions of the patient's history were reviewed and updated as appropriate: allergies, current medications, past family history, past medical history, past social history, past surgical history and problem list.  Review of Systems  the systems are reviewed with the following positive findings, chest pain, with palpitations, current, shortness of breath, wheezing, tightness snoring, sleep apnea, heartburn, constipation, diarrhea, frequency, and urgency. Skin rash itching and redness poor healing, multiple cat scratches. Dizziness, anxiety, and depression. Excessive thirst heat intolerance, seasonal allergies, blurred vision. Ordering of the eyes. correction watering of the eyes.  No problem we will start 48 hrs obs   but she has sleep apnea, heart disease and COPD

## 2012-02-15 NOTE — Telephone Encounter (Signed)
Marissa Calamity with [facility] [employer] called about the shoulder surgery scheduled for [name] for 02/27/12.  She said that the insurance company considers this an ambulatory procedure.  She is asking if you will consider to start with a 48 hour observation period, and if not she needs to know why. Jennifer's # is [phone]

## 2012-02-16 NOTE — Telephone Encounter (Signed)
I spoke with [facility] [place] [employer], she said OK, will start with the 48 hours inpatient and if more time is needed Forestine Na will contact her And they will go from there.  Said everything is OK to proceed and thank you for agreeing to start with 48 hours.

## 2012-02-16 NOTE — Telephone Encounter (Signed)
Did you get this response ?

## 2012-02-21 ENCOUNTER — Encounter (HOSPITAL_COMMUNITY): Payer: Self-pay

## 2012-02-21 ENCOUNTER — Encounter (HOSPITAL_COMMUNITY)
Admission: RE | Admit: 2012-02-21 | Discharge: 2012-02-21 | Disposition: A | Discharge status: Home / Self Care | Payer: Medicare Other | Source: Ambulatory Visit | Attending: Orthopedic Surgery | Admitting: Orthopedic Surgery

## 2012-02-21 HISTORY — DX: Hypothyroidism, unspecified: E03.9

## 2012-02-21 LAB — BASIC METABOLIC PANEL
CO2: 32 mEq/L (ref 19–32)
Calcium: 9.7 mg/dL (ref 8.4–10.5)
Chloride: 95 mEq/L — ABNORMAL LOW (ref 96–112)
Creatinine, Ser: 0.77 mg/dL (ref 0.50–1.10)
Glucose, Bld: 240 mg/dL — ABNORMAL HIGH (ref 70–99)

## 2012-02-21 LAB — HEMOGLOBIN AND HEMATOCRIT, BLOOD: HCT: 37 % (ref 36.0–46.0)

## 2012-02-21 NOTE — Telephone Encounter (Signed)
02/21/12 called [employer] to follow up on status of pre-authorization review.  Spoke with Marcellina Millin, who verified that the faxed information was received and is in system; states our office was contacted (see separate note), by Marissa Calamity; states surgery approved for 48-hour observation; patient's ID #[id number] for reference.  States If additional days are needed, hospital will need to contact them at that time.

## 2012-02-21 NOTE — Patient Instructions (Addendum)
[place]  02/21/2012   Your procedure is scheduled on:   02/27/2012  Report to [facility] at  60  AM.  Call this number if you have problems the morning of surgery: [phone]   Remember:   Do not eat food:After Midnight.  May have clear liquids:until Midnight .    Take these medicines the morning of surgery with A SIP OF WATER: bupropion,diltiazem,cymbalta,levothyrpxine,lisinopril,tramdol. Take albuterol before you come.   Do not wear jewelry, make-up or nail polish.  Do not wear lotions, powders, or perfumes. You may wear deodorant.  Do not shave 48 hours prior to surgery. Men may shave face and neck.  Do not bring valuables to the hospital.  Contacts, dentures or bridgework may not be worn into surgery.  Leave suitcase in the car. After surgery it may be brought to your room.  For patients admitted to the hospital, checkout time is 11:00 AM the day of discharge.   Patients discharged the day of surgery will not be allowed to drive home.  Name and phone number of your driver: family  Special Instructions: CHG Shower Use Special Wash: 1/2 bottle night before surgery and 1/2 bottle morning of surgery.   Please read over the following fact sheets that you were given: Pain Booklet, MRSA Information, Surgical Site Infection Prevention, Anesthesia Post-op Instructions and Care and Recovery After Surgery Rotator Cuff Injury The rotator cuff is the collective set of muscles and tendons that make up the stabilizing unit of your shoulder. This unit holds in the ball of the humerus (upper arm bone) in the socket of the scapula (shoulder blade). Injuries to this stabilizing unit most commonly come from sports or activities that cause the arm to be moved repeatedly over the head. Examples of this include throwing, weight lifting, swimming, racquet sports, or an injury such as falling on your arm. Chronic (longstanding) irritation of this unit can cause inflammation (soreness), bursitis,  and eventual damage to the tendons to the point of rupture (tear). An acute (sudden) injury of the rotator cuff can result in a partial or complete tear. You may need surgery with complete tears. Small or partial rotator cuff tears may be treated conservatively with temporary immobilization, exercises and rest. Physical therapy may be needed. HOME CARE INSTRUCTIONS   Apply ice to the injury for 15 to 20 minutes 3 to 4 times per day for the first 2 days. Put the ice in a plastic bag and place a towel between the bag of ice and your skin.   If you have a shoulder immobilizer (sling and straps), do not remove it for as long as directed by your caregiver or until you see a caregiver for a follow-up examination. If you need to remove it, move your arm as little as possible.   You may want to sleep on several pillows or in a recliner at night to lessen swelling and pain.   Only take over-the-counter or prescription medicines for pain, discomfort, or fever as directed by your caregiver.   Do simple hand squeezing exercises with a soft rubber ball to decrease hand swelling.  SEEK MEDICAL CARE IF:   Pain in your shoulder increases or new pain or numbness develops in your arm, hand, or fingers.   Your hand or fingers are colder than your other hand.  SEEK IMMEDIATE MEDICAL CARE IF:   Your arm, hand, or fingers are numb or tingling.   Your arm, hand, or fingers are  increasingly swollen and painful, or turn white or blue.  Document Released: 06/03/2000 Document Revised: 05/26/2011 Document Reviewed: 05/27/2008 [facility] Patient Information 2012 [place].PATIENT INSTRUCTIONS POST-ANESTHESIA  IMMEDIATELY FOLLOWING SURGERY:  Do not drive or operate machinery for the first twenty four hours after surgery.  Do not make any important decisions for twenty four hours after surgery or while taking narcotic pain medications or sedatives.  If you develop intractable nausea and vomiting or a severe headache  please notify your doctor immediately.  FOLLOW-UP:  Please make an appointment with your surgeon as instructed. You do not need to follow up with anesthesia unless specifically instructed to do so.  WOUND CARE INSTRUCTIONS (if applicable):  Keep a dry clean dressing on the anesthesia/puncture wound site if there is drainage.  Once the wound has quit draining you may leave it open to air.  Generally you should leave the bandage intact for twenty four hours unless there is drainage.  If the epidural site drains for more than 36-48 hours please call the anesthesia department.  QUESTIONS?:  Please feel free to call your physician or the hospital operator if you have any questions, and they will be happy to assist you.

## 2012-02-22 NOTE — Pre-Procedure Instructions (Signed)
Dr Patsey Berthold aware of potassium and glucose,ordered I stat on arrival to daysurgery 02/27/2012.

## 2012-02-23 NOTE — H&P (Addendum)
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 She reports sharp, dull, throbbing, stabbing, burning 10 over 10, constant pain in the RIGHT shoulder that nothing makes better in every motion makes worse. She has lost motion in the shoulder. She has weakness catching, and she cannot do her routine activities.  She has a history of a coronary catheterization. Last May by Dr. Lia Foyer, and was told that her heart was fine. She does have some respiratory disease with COPD, type symptoms, including sleep apnea. She has a sleep apnea machine, but doesn't use it. Instead, she sleeps upright in a recliner.  The following portions of the patient's history were reviewed and updated as appropriate: allergies, current medications, past family history, past medical history, past social history, past surgical history and problem list.   Past Medical History  Diagnosis Date  . Type 2 diabetes mellitus   . Essential hypertension, benign   . GERD (gastroesophageal reflux disease)   . Edema     feet and legs  . Morbid obesity   . COPD (chronic obstructive pulmonary disease)   . Thyroid disease   . Asthma   . Arthritis   . Depression   . Cirrhosis of liver   . Hypothyroidism 2009    thyroid cancer  . Cancer     thyroid   Past Surgical History  Procedure Date  . Thyroidectomy 2009  . Cholecystectomy 1996  . Tonsillectomy 1958  . Foot surgery 2000    bone spur removed  . Abdominal hysterectomy 1985  . Exploratory laparotomy 2003  . Umbilical hernia repair 1856  . Debridement of abdominal wound 2011  . Cesarean section 1977  . Cardiac catheterization 2012   History   Social History  . Marital Status: Widowed    Spouse Name: N/A    Number of Children: N/A  . Years of  Education: 12   Occupational History  . Full-time     [employer] for daycare   Social History Main Topics  . Smoking status: Never Smoker   . Smokeless tobacco: Never Used  . Alcohol Use: No  . Drug Use: No  . Sexually Active: Yes    Birth Control/ Protection: Surgical   Other Topics Concern  . Not on file   Social History Narrative  . No narrative on file   family history includes Arthritis in an unspecified family member; Asthma in an unspecified family member; Cancer in an unspecified family member; Diabetes in an unspecified family member; Heart disease in an unspecified family member; and Kidney disease in an unspecified family member.  No current facility-administered medications on file prior to encounter.   Current Outpatient Prescriptions on File Prior to Encounter  Medication Sig Dispense Refill  . albuterol (PROVENTIL HFA) 108 (90 BASE) MCG/ACT inhaler Inhale 2 puffs into the lungs every 6 (six) hours as needed. Shortness of breath      . aspirin 81 MG EC tablet Take 81 mg by mouth daily.       [name] buPROPion (WELLBUTRIN SR) 150 MG 12 hr tablet 150 mg 2 (two) times daily.       . CELEBREX 200 MG capsule Take 200 mg by mouth daily.       . colesevelam (WELCHOL) 625 MG tablet  Take 1,875 mg by mouth 2 (two) times daily with a meal. 3 tabs am 3 tabs pm      . diltiazem (DILACOR XR) 240 MG 24 hr capsule Take 240 mg by mouth daily.      . DULoxetine (CYMBALTA) 60 MG capsule Take 120 mg by mouth daily.       . furosemide (LASIX) 80 MG tablet Take 80 mg by mouth 2 (two) times daily.       [name] glimepiride (AMARYL) 4 MG tablet Take 4 mg by mouth daily.       [name] levothyroxine (SYNTHROID, LEVOTHROID) 300 MCG tablet Take 300 mcg by mouth daily.        [name] liothyronine (CYTOMEL) 5 MCG tablet Take 5 mcg by mouth daily.      [name] lisinopril (PRINIVIL,ZESTRIL) 40 MG tablet Take 40 mg by mouth daily.      . metFORMIN (GLUCOPHAGE) 500 MG tablet Take 500 mg by mouth 2 (two) times daily.       [name] nystatin  (NYSTOP) 100000 UNIT/GM POWD Apply 100,000 g topically as needed. Rash      . simvastatin (ZOCOR) 20 MG tablet Take 20 mg by mouth daily.       [name] terbinafine (LAMISIL) 250 MG tablet Take 250 mg by mouth daily.       Review of Systems  the systems are reviewed with the following positive findings, chest pain, with palpitations, current, shortness of breath, wheezing, tightness snoring, sleep apnea, heartburn, constipation, diarrhea, frequency, and urgency. Skin rash itching and redness poor healing, multiple cat scratches. Dizziness, anxiety, and depression. Excessive thirst heat intolerance, seasonal allergies, blurred vision. Ordering of the eyes. correction watering of the eyes.  Negative findings include weight loss or weight gain, fever or chills    Objective:    BP 144/80  Ht 5' 4"  (1.626 m)  Wt 338 lb (153.316 kg)  BMI 58.02 kg/m2               Vital signs are stable as recorded  General appearance is normal, obesity  The patient is alert and oriented x3  The patient's mood and affect are normal  Gait assessment: normal waddling  The cardiovascular exam reveals normal pulses and temperature without edema swelling.  The lymphatic system is negative for palpable lymph nodes  The sensory exam is normal.  There are no pathologic reflexes.  Balance is normal.  Exam of the right shoulder  Inspection elevation of the RIGHT shoulder only to 90, requiring assistance to get the shoulder to 120. Painful range of motion arthropathy. Range of motion. Passive range of motion 120. The shoulder remained stable. The subscapular strength is normal. The external rotators are weak. The supraspinatus tendon. The skin is intact, but there are multiple scratches from her cat  Left shoulder FROM, NORMAL STRENGTH, NO INSTABILITY NO TENDERNESS  Lower extremity exam:  Inspection and palpation revealed no tenderness or abnormality in alignment in the lower extremities.  Range of motion is full. Strength is  grade 5.  all joints are stable.  Imaging. Shoulder x-ray shows a.c. Joint arthrosis.  MRI done in an open unit did show some motion artifact, but the supraspinatus and infraspinatus tendon appeared to be torn with retraction to the humeral head. There is a.c. Joint arthrosis noted. Large effusion noted.      Assessment:      Right supraspinatus and infraspinatus tendon tear, complete with retraction. No atrophy      Plan:  Neurosurgeon distributed.  a complete discussion was performed regarding possible treatment options. To simply get her back to her functional level, and cooking. It would require surgical repair. She is okay with doing this understanding that she will have him a three-month period where she cannot do any lifting and then a 6 month period total where she can't lift any pots. He'll take a year for her shoulder to feel normal.  She understands that she is at high risk for respiratory problems during or after surgery, as well as diabetes, which may increase her infection. Wrist. We explained procedure, including the use of suture anchors and repair of the cuff and removal of the distal clavicle.  You have been scheduled for rotator cuff surgery. All surgeries carry some risk. Remember you always have the option of continued nonsurgical treatment. However in this situation the risks vs. the benefits favor surgery as the best treatment option. The risks of the surgery includes the following but is not limited to bleeding, infection, pulmonary embolus, death from anesthesia, nerve injury vascular injury or need for further surgery, continued pain.  Specific to this procedure the following risks and complications are rare but possible  Stiffness, pain, weakness, re tear.  I expect 9-12 months to fully recover from this procedure.        Open right rotator cuff repair

## 2012-02-27 ENCOUNTER — Telehealth: Payer: Self-pay | Admitting: Orthopedic Surgery

## 2012-02-27 ENCOUNTER — Ambulatory Visit (HOSPITAL_COMMUNITY)
Admission: RE | Admit: 2012-02-27 | Discharge: 2012-02-27 | Disposition: A | Discharge status: Home / Self Care | Payer: Medicare Other | Source: Ambulatory Visit | Attending: Orthopedic Surgery | Admitting: Orthopedic Surgery

## 2012-02-27 ENCOUNTER — Encounter (HOSPITAL_COMMUNITY): Payer: Self-pay | Admitting: Anesthesiology

## 2012-02-27 ENCOUNTER — Encounter (HOSPITAL_COMMUNITY): Payer: Self-pay | Admitting: *Deleted

## 2012-02-27 ENCOUNTER — Encounter (HOSPITAL_COMMUNITY)
Admission: RE | Disposition: A | Discharge status: Home / Self Care | Payer: Self-pay | Source: Ambulatory Visit | Attending: Orthopedic Surgery

## 2012-02-27 DIAGNOSIS — Z01812 Encounter for preprocedural laboratory examination: Secondary | ICD-10-CM | POA: Insufficient documentation

## 2012-02-27 DIAGNOSIS — Z5309 Procedure and treatment not carried out because of other contraindication: Secondary | ICD-10-CM | POA: Insufficient documentation

## 2012-02-27 DIAGNOSIS — M75120 Complete rotator cuff tear or rupture of unspecified shoulder, not specified as traumatic: Secondary | ICD-10-CM | POA: Insufficient documentation

## 2012-02-27 DIAGNOSIS — Z01810 Encounter for preprocedural cardiovascular examination: Secondary | ICD-10-CM | POA: Insufficient documentation

## 2012-02-27 LAB — POCT I-STAT 4, (NA,K, GLUC, HGB,HCT)
Glucose, Bld: 254 mg/dL — ABNORMAL HIGH (ref 70–99)
Hemoglobin: 12.2 g/dL (ref 12.0–15.0)
Potassium: 3.6 mEq/L (ref 3.5–5.1)
Sodium: 139 mEq/L (ref 135–145)

## 2012-02-27 SURGERY — CANCELLED PROCEDURE
Laterality: Right

## 2012-02-27 MED ORDER — BUPIVACAINE-EPINEPHRINE PF 0.5-1:200000 % IJ SOLN
INTRAMUSCULAR | Status: AC
  Filled 2012-02-27: qty 20

## 2012-02-27 MED ORDER — SODIUM CHLORIDE 0.9 % IV SOLN
1500.0000 mg | INTRAVENOUS | Status: DC
  Filled 2012-02-27: qty 1500

## 2012-02-27 MED ORDER — CHLORHEXIDINE GLUCONATE 4 % EX LIQD: 60.0000 mL | Freq: Once | CUTANEOUS | Status: DC

## 2012-02-27 SURGICAL SUPPLY — 72 items
BAG HAMPER (MISCELLANEOUS) IMPLANT
BIT DRILL 2.0MX128MM (BIT) IMPLANT
BLADE HEX COATED 2.75 (ELECTRODE) IMPLANT
BLADE OSC/SAGITTAL MD 9X18.5 (BLADE) IMPLANT
BLADE SURG 15 STRL LF DISP TIS (BLADE) IMPLANT
BLADE SURG 15 STRL SS (BLADE)
BLADE SURG SZ10 CARB STEEL (BLADE) IMPLANT
BNDG COHESIVE 4X5 TAN NS LF (GAUZE/BANDAGES/DRESSINGS) IMPLANT
BUR FAST CUTTING (BURR)
BUR ROUND 5.0 (BURR) IMPLANT
BUR SRG 54X4.7X12 FLUT (BURR) IMPLANT
BURR SRG 54X4.7X12 FLUT (BURR)
CATH KIT ON Q 2.5IN SLV (PAIN MANAGEMENT) IMPLANT
CHLORAPREP W/TINT 26ML (MISCELLANEOUS) IMPLANT
CLOTH BEACON ORANGE TIMEOUT ST (SAFETY) IMPLANT
COOLER CRYO CUFF IC AND MOTOR (MISCELLANEOUS) IMPLANT
COVER LIGHT HANDLE STERIS (MISCELLANEOUS) IMPLANT
COVER MAYO STAND XLG (DRAPE) IMPLANT
COVER PROBE W GEL 5X96 (DRAPES) IMPLANT
CUFF CRYO UNI SHDR 32X48 (MISCELLANEOUS) IMPLANT
DECANTER SPIKE VIAL GLASS SM (MISCELLANEOUS) IMPLANT
DRAPE ORTHO 2.5IN SPLIT 77X108 (DRAPES) IMPLANT
DRAPE ORTHO SPLIT 77X108 STRL (DRAPES)
DRAPE PROXIMA HALF (DRAPES) IMPLANT
DRAPE U-SHAPE 47X51 STRL (DRAPES) IMPLANT
DRESSING ALLEVYN BORDER 5X5 (GAUZE/BANDAGES/DRESSINGS) IMPLANT
DRESSING ALLEVYN BORDER HEEL (GAUZE/BANDAGES/DRESSINGS) IMPLANT
ELECT REM PT RETURN 9FT ADLT (ELECTROSURGICAL)
ELECTRODE REM PT RTRN 9FT ADLT (ELECTROSURGICAL) IMPLANT
FORMALIN 10 PREFIL 120ML (MISCELLANEOUS) IMPLANT
GLOVE SKINSENSE NS SZ8.0 LF (GLOVE)
GLOVE SKINSENSE STRL SZ8.0 LF (GLOVE) IMPLANT
GLOVE SS N UNI LF 8.5 STRL (GLOVE) IMPLANT
GOWN STRL REIN XL XLG (GOWN DISPOSABLE) IMPLANT
INST SET MINOR BONE (KITS) IMPLANT
KIT BLADEGUARD II DBL (SET/KITS/TRAYS/PACK) IMPLANT
KIT ROOM TURNOVER APOR (KITS) IMPLANT
KIT SURGICAL DEVON (SET/KITS/TRAYS/PACK) IMPLANT
MANIFOLD NEPTUNE II (INSTRUMENTS) IMPLANT
MARKER SKIN DUAL TIP RULER LAB (MISCELLANEOUS) IMPLANT
NEEDLE HYPO 21X1.5 SAFETY (NEEDLE) IMPLANT
NEEDLE MA TROC 1/2 (NEEDLE) IMPLANT
NEEDLE MAYO 6 CRC TAPER PT (NEEDLE) IMPLANT
NS IRRIG 1000ML POUR BTL (IV SOLUTION) IMPLANT
PACK BASIC III (CUSTOM PROCEDURE TRAY)
PACK SRG BSC III STRL LF ECLPS (CUSTOM PROCEDURE TRAY) IMPLANT
PAD ARMBOARD 7.5X6 YLW CONV (MISCELLANEOUS) IMPLANT
PASSER SUT CAPTURE FIRST (SUTURE) IMPLANT
PENCIL HANDSWITCHING (ELECTRODE) IMPLANT
RASP LG TEAR CROSS CUT (RASP) IMPLANT
RASP SM TEAR CROSS CUT (RASP) IMPLANT
SET BASIN LINEN APH (SET/KITS/TRAYS/PACK) IMPLANT
SLING ARM FOAM STRAP LRG (SOFTGOODS) IMPLANT
SLING ARM FOAM STRAP MED (SOFTGOODS) IMPLANT
SLING ARM FOAM STRAP XLG (SOFTGOODS) IMPLANT
SPONGE LAP 18X18 X RAY DECT (DISPOSABLE) IMPLANT
STAPLER VISISTAT 35W (STAPLE) IMPLANT
STOCKINETTE IMPERVIOUS LG (DRAPES) IMPLANT
STRIP CLOSURE SKIN 1/2X4 (GAUZE/BANDAGES/DRESSINGS) IMPLANT
SUT BONE WAX W31G (SUTURE) IMPLANT
SUT BRALON NAB BRD #1 30IN (SUTURE) IMPLANT
SUT ETHIBOND 5 LR DA (SUTURE) IMPLANT
SUT ETHIBOND NAB OS 4 #2 30IN (SUTURE) IMPLANT
SUT ETHILON 3 0 FSL (SUTURE) IMPLANT
SUT MON AB 0 CT1 (SUTURE) IMPLANT
SUT MON AB 2-0 CT1 36 (SUTURE) IMPLANT
SUT PROLENE 3 0 PS 1 (SUTURE) IMPLANT
SUT VIC AB 1 CT1 27 (SUTURE)
SUT VIC AB 1 CT1 27XBRD ANTBC (SUTURE) IMPLANT
SYR 30ML LL (SYRINGE) IMPLANT
SYR BULB IRRIGATION 50ML (SYRINGE) IMPLANT
YANKAUER SUCT 12FT TUBE ARGYLE (SUCTIONS) IMPLANT

## 2012-02-27 NOTE — Progress Notes (Signed)
Patient discharged to home. Surgery cancelled per dr Aline Brochure. Patent to see labauer to follow up about not being on cardizem for 2 months.

## 2012-02-27 NOTE — Progress Notes (Signed)
Surgery postponed due to medical issues related to diltiazem and sugar

## 2012-02-27 NOTE — Progress Notes (Signed)
Spoke with corey rn at cardiology  Regarding patient  He is going to find which doctor was looking after her and call patient and schedule to see her at [facility] ASAP.

## 2012-02-27 NOTE — Telephone Encounter (Signed)
Called, and fax sent, to [facility], ph [phone], fax [phone], regarding cancellation of surgery for today, 02/27/12, due to medical clearance pending. Left voice message to notify + faxed to attention of pre-authorization/utilzation, as noted.  Further information to follow.

## 2012-02-27 NOTE — Telephone Encounter (Signed)
[name] [name] wanted you to know that she has an appointment to see the PA at Dr. Myles Gip office 03/01/12 at 2:00

## 2012-02-27 NOTE — Anesthesia Preprocedure Evaluation (Deleted)
Anesthesia Evaluation  Patient identified by MRN, date of birth, ID band Patient awake    Reviewed: Allergy & Precautions, H&P , NPO status , Patient's Chart, lab work & pertinent test results  Airway       Dental   Pulmonary asthma , COPD         Cardiovascular hypertension, Pt. on medications     Neuro/Psych PSYCHIATRIC DISORDERS Depression    GI/Hepatic GERD-  Medicated,  Endo/Other  diabetes, Poorly Controlled, Type 2, Oral Hypoglycemic AgentsHypothyroidism Morbid obesity  Renal/GU      Musculoskeletal   Abdominal   Peds  Hematology   Anesthesia Other Findings   Reproductive/Obstetrics                           Anesthesia Physical Anesthesia Plan  ASA:   Anesthesia Plan:    Post-op Pain Management:    Induction:   Airway Management Planned:   Additional Equipment:   Intra-op Plan:   Post-operative Plan:   Informed Consent:   Plan Discussed with:   Anesthesia Plan Comments: (Surgery postponed, pt has not been taking diltiazem as prescribed for a hx of SVT. Will await reschedule after meds are restarted.)        Anesthesia Quick Evaluation

## 2012-03-01 ENCOUNTER — Encounter: Payer: Self-pay | Admitting: *Deleted

## 2012-03-01 ENCOUNTER — Ambulatory Visit (INDEPENDENT_AMBULATORY_CARE_PROVIDER_SITE_OTHER): Payer: Medicare Other | Admitting: Adult Health

## 2012-03-01 ENCOUNTER — Encounter: Payer: Self-pay | Admitting: Adult Health

## 2012-03-01 VITALS — BP 180/88 | HR 113 | Ht 65.0 in | Wt 327.0 lb

## 2012-03-01 DIAGNOSIS — I10 Essential (primary) hypertension: Secondary | ICD-10-CM

## 2012-03-01 DIAGNOSIS — R000 Tachycardia, unspecified: Secondary | ICD-10-CM

## 2012-03-01 MED ORDER — DILTIAZEM HCL ER 240 MG PO CP24: 240.0000 mg | ORAL_CAPSULE | Freq: Every day | ORAL | Status: DC

## 2012-03-01 NOTE — Assessment & Plan Note (Signed)
Blood pressure is out of control. She is without her Cardizem for 2 months. I have restarted her Cardizem at prior dose 240 mg SR daily. She will followup in a week for a blood pressure check. If blood pressure is well-controlled she will be cleared for surgery.

## 2012-03-01 NOTE — Assessment & Plan Note (Signed)
She reportedly had a normal stress test in 2009. She is not complaining of any chest pressure right now. She does have ongoing history of obstructive sleep apnea which could be causing some discomfort with breathing. Her blood pressure is elevated as well. She will followup in one week for blood pressure check and evaluation of her response to medications. She is very concerned about paying an additional co-pay, but I have explained to her that when the new medication is added in the setting of hypertension and tachycardia she will need to have followup before we can allow her to proceed with surgery. She is being referred to the front desk for information concerning assistance with her bill via the [facility] system.

## 2012-03-01 NOTE — Progress Notes (Signed)
HPI: [name] [name] is a 57 year old morbidly obese patient of Dr. Domenic Polite, we are following for ongoing assessment of hypertension, tachycardia, with history of asthma, non-insulin-dependent diabetes, and obstructive sleep apnea, unable to tolerate CPAP. The patient normally takes Cardizem 240 mg XR daily. She has been out of this medication for 2 months. She was due to have rotator cuff tear repair and was seen by Dr. Aline Brochure who noticed that her blood pressure and heart rate were elevated. He referred her back to cardiology in order to have her blood pressure better controlled and to be cleared for surgery.   The patient states she has been under incredible amount of stress at home with her family. She has not been able to followup with appointments regularly. She was last seen  May of 2012. She comes today with continued tachycardia and hypertensive.  Allergies  Allergen Reactions  . Clarithromycin Rash    Yeast Infection  . Clindamycin/Lincomycin Rash    Yeast Infection   . Latex Itching and Rash    Latex tape.  . Neosporin (Neomycin-Polymyxin B Gu) Rash    Eyes Drops  . Penicillins Rash    Current Outpatient Prescriptions  Medication Sig Dispense Refill  . albuterol (PROVENTIL HFA) 108 (90 BASE) MCG/ACT inhaler Inhale 2 puffs into the lungs every 6 (six) hours as needed. Shortness of breath      . aspirin 81 MG EC tablet Take 81 mg by mouth daily.       [name] buPROPion (WELLBUTRIN SR) 150 MG 12 hr tablet 150 mg 2 (two) times daily.       . CELEBREX 200 MG capsule Take 200 mg by mouth daily.       . colesevelam (WELCHOL) 625 MG tablet Take 1,875 mg by mouth 2 (two) times daily with a meal. 3 tabs am 3 tabs pm      . DULoxetine (CYMBALTA) 60 MG capsule Take 120 mg by mouth daily.       . furosemide (LASIX) 80 MG tablet Take 80 mg by mouth 2 (two) times daily.       [name] glimepiride (AMARYL) 4 MG tablet Take 4 mg by mouth daily.       [name] levothyroxine (SYNTHROID, LEVOTHROID) 300 MCG tablet  Take 300 mcg by mouth daily.        [name] liothyronine (CYTOMEL) 5 MCG tablet Take 5 mcg by mouth daily.      [name] lisinopril (PRINIVIL,ZESTRIL) 40 MG tablet Take 40 mg by mouth daily.      . metFORMIN (GLUCOPHAGE) 500 MG tablet Take 500 mg by mouth 2 (two) times daily.       . mupirocin ointment (BACTROBAN) 2 %       . nystatin (NYSTOP) 100000 UNIT/GM POWD Apply 100,000 g topically as needed. Rash      . simvastatin (ZOCOR) 20 MG tablet Take 20 mg by mouth daily.       [name] terbinafine (LAMISIL) 250 MG tablet Take 250 mg by mouth daily.      . traMADol (ULTRAM) 50 MG tablet Take 50 mg by mouth every 6 (six) hours as needed. Pain      . diltiazem (DILACOR XR) 240 MG 24 hr capsule Take 1 capsule (240 mg total) by mouth daily.  30 capsule  11  . DISCONTD: diltiazem (DILACOR XR) 240 MG 24 hr capsule Take 240 mg by mouth daily.        Past Medical History  Diagnosis Date  . Type  2 diabetes mellitus   . Essential hypertension, benign   . GERD (gastroesophageal reflux disease)   . Edema     feet and legs  . Morbid obesity   . COPD (chronic obstructive pulmonary disease)   . Thyroid disease   . Asthma   . Arthritis   . Depression   . Cirrhosis of liver   . Hypothyroidism 2009    thyroid cancer  . Cancer     thyroid    Past Surgical History  Procedure Date  . Thyroidectomy 2009  . Cholecystectomy 1996  . Tonsillectomy 1958  . Foot surgery 2000    bone spur removed  . Abdominal hysterectomy 1985  . Exploratory laparotomy 2003  . Umbilical hernia repair 3967  . Debridement of abdominal wound 2011  . Cesarean section 1977  . Cardiac catheterization 2012    SWV:TVNRWC of systems complete and found to be negative unless listed above  PHYSICAL EXAM BP 180/88  Pulse 113  Ht 5' 5"  (1.651 m)  Wt 327 lb (148.326 kg)  BMI 54.42 kg/m2  SpO2 95%  General: Well developed, well nourished, in no acute distress, morbidly obese.  Head: Eyes PERRLA, No xanthomas.   Normal cephalic and  atramatic  Lungs: Clear bilaterally to auscultation and percussion. Diminished bibasilar with poor inspiratory effort. Heart: HRRR S1 S2, tachycardic, with S4  murmur auscultated.  Pulses are 2+ & equal.            No carotid bruit. No JVD.  No abdominal bruits. No femoral bruits. Abdomen: Bowel sounds are positive, abdomen soft and non-tender without masses or                  Hernia's noted. Msk:  Back normal, slow lumbering gait. Normal strength and tone for age. Extremities: No clubbing, cyanosis, mild non pitting edema.  DP +1 Neuro: Alert and oriented X 3. Psych:  Good affect, responds appropriately   ASSESSMENT AND PLAN

## 2012-03-01 NOTE — Patient Instructions (Signed)
Please restart your Cardizem ER (Diltiazem) 1 capsule daily  Return in 1 week for a blood pressure check and an office visit for your surgical clearance

## 2012-03-05 ENCOUNTER — Ambulatory Visit: Payer: Medicare Other | Admitting: Orthopedic Surgery

## 2012-03-08 ENCOUNTER — Ambulatory Visit (INDEPENDENT_AMBULATORY_CARE_PROVIDER_SITE_OTHER): Payer: Medicare Other | Admitting: *Deleted

## 2012-03-08 VITALS — BP 148/88 | HR 98 | Wt 327.0 lb

## 2012-03-08 DIAGNOSIS — I10 Essential (primary) hypertension: Secondary | ICD-10-CM

## 2012-03-08 NOTE — Progress Notes (Signed)
Patient presents for blood pressure check s/p re-initiation of Cardizem 240 mg on 9/19, after a long hiatus from this medication.  She was scheduled for rotator cuff repair by Dr Aline Brochure, which was cancelled, due to her out of control hypertension.  BP at previous visit was 180/88, which is much improved today.  Per Jory Sims, NP, could provide surgical clearance if blood pressure was controlled at today's visit.  Pt states taking medications, as prescribed and denies c/o at this time.  Please advise.

## 2012-03-08 NOTE — Progress Notes (Signed)
Please record the blood pressure and heart rate in the current encounter, and I can make a decision.

## 2012-03-08 NOTE — Progress Notes (Signed)
Done

## 2012-03-09 NOTE — Progress Notes (Signed)
Information was sent to Dr Aline Brochure this am

## 2012-03-09 NOTE — Progress Notes (Signed)
Blood pressure and heart rate are better. Continue current dose of Cardizem. Should be able to proceed with rotator cuff surgery with Dr. Aline Brochure.

## 2012-03-12 ENCOUNTER — Other Ambulatory Visit: Payer: Self-pay | Admitting: *Deleted

## 2012-03-13 ENCOUNTER — Encounter (HOSPITAL_COMMUNITY): Payer: Self-pay | Admitting: Pharmacy Technician

## 2012-03-13 ENCOUNTER — Telehealth: Payer: Self-pay | Admitting: Family Medicine

## 2012-03-13 NOTE — Telephone Encounter (Signed)
No note from doctor

## 2012-03-15 ENCOUNTER — Other Ambulatory Visit: Payer: Self-pay | Admitting: Orthopedic Surgery

## 2012-03-15 ENCOUNTER — Encounter (HOSPITAL_COMMUNITY): Payer: Self-pay

## 2012-03-15 ENCOUNTER — Encounter (HOSPITAL_COMMUNITY)
Admission: RE | Admit: 2012-03-15 | Discharge: 2012-03-15 | Disposition: A | Discharge status: Home / Self Care | Payer: Medicare Other | Source: Ambulatory Visit | Attending: Orthopedic Surgery | Admitting: Orthopedic Surgery

## 2012-03-15 DIAGNOSIS — IMO0002 Reserved for concepts with insufficient information to code with codable children: Secondary | ICD-10-CM | POA: Insufficient documentation

## 2012-03-15 DIAGNOSIS — Z9109 Other allergy status, other than to drugs and biological substances: Secondary | ICD-10-CM | POA: Insufficient documentation

## 2012-03-15 DIAGNOSIS — R079 Chest pain, unspecified: Secondary | ICD-10-CM | POA: Insufficient documentation

## 2012-03-15 DIAGNOSIS — K219 Gastro-esophageal reflux disease without esophagitis: Secondary | ICD-10-CM | POA: Insufficient documentation

## 2012-03-15 DIAGNOSIS — Z880 Allergy status to penicillin: Secondary | ICD-10-CM | POA: Insufficient documentation

## 2012-03-15 DIAGNOSIS — Z888 Allergy status to other drugs, medicaments and biological substances status: Secondary | ICD-10-CM | POA: Insufficient documentation

## 2012-03-15 DIAGNOSIS — M75120 Complete rotator cuff tear or rupture of unspecified shoulder, not specified as traumatic: Secondary | ICD-10-CM | POA: Insufficient documentation

## 2012-03-15 DIAGNOSIS — E1165 Type 2 diabetes mellitus with hyperglycemia: Secondary | ICD-10-CM | POA: Insufficient documentation

## 2012-03-15 DIAGNOSIS — M19019 Primary osteoarthritis, unspecified shoulder: Secondary | ICD-10-CM | POA: Insufficient documentation

## 2012-03-15 DIAGNOSIS — Z881 Allergy status to other antibiotic agents status: Secondary | ICD-10-CM | POA: Insufficient documentation

## 2012-03-15 DIAGNOSIS — I10 Essential (primary) hypertension: Secondary | ICD-10-CM | POA: Insufficient documentation

## 2012-03-15 DIAGNOSIS — E876 Hypokalemia: Secondary | ICD-10-CM

## 2012-03-15 DIAGNOSIS — R609 Edema, unspecified: Secondary | ICD-10-CM | POA: Insufficient documentation

## 2012-03-15 LAB — BASIC METABOLIC PANEL
BUN: 9 mg/dL (ref 6–23)
GFR calc non Af Amer: >90 mL/min (ref >90)
Glucose, Bld: 424 mg/dL — ABNORMAL HIGH (ref 70–99)
Potassium: 3.3 mEq/L — ABNORMAL LOW (ref 3.5–5.1)

## 2012-03-15 MED ORDER — POTASSIUM CHLORIDE ER 10 MEQ PO TBCR: 20.0000 meq | EXTENDED_RELEASE_TABLET | Freq: Three times a day (TID) | ORAL | Status: DC

## 2012-03-15 NOTE — Patient Instructions (Signed)
[place]  03/15/2012   Your procedure is scheduled on:  Monday, 03/19/12  Report to [facility] at [place] [facility].  Call this number if you have problems the morning of surgery: [phone]   Remember:   Do not eat food:After Midnight.  May have clear liquids:until Midnight .  Clear liquids include soda, tea, black coffee, apple or grape juice, broth.  Take these medicines the morning of surgery with A SIP OF WATER: wellbutrin, cardizem, cymbalta, synthroid, lisinopril, and proventil. Please bring your inhaler with you the morning of surgery.   Do not wear jewelry, make-up or nail polish.  Do not wear lotions, powders, or perfumes. You may wear deodorant.  Do not shave 48 hours prior to surgery. Men may shave face and neck.  Do not bring valuables to the hospital.  Contacts, dentures or bridgework may not be worn into surgery.  Leave suitcase in the car. After surgery it may be brought to your room.  For patients admitted to the hospital, checkout time is 11:00 AM the day of discharge.   Patients discharged the day of surgery will not be allowed to drive home.  Name and phone number of your driver: family  Special Instructions: Incentive Spirometry - Practice and bring it with you on the day of surgery. Shower using CHG 2 nights before surgery and the night before surgery.  If you shower the day of surgery use CHG.  Use special wash - you have one bottle of CHG for all showers.  You should use approximately 1/3 of the bottle for each shower.   Please read over the following fact sheets that you were given: Pain Booklet, Coughing and Deep Breathing, MRSA Information, Surgical Site Infection Prevention, Anesthesia Post-op Instructions and Care and Recovery After Surgery  Incentive Spirometer An incentive spirometer is a tool that can help keep your lungs clear and active. This tool measures how well you are filling your lungs with each breath. Taking long deep breaths may help reverse or  decrease the chance of developing breathing (pulmonary) problems (especially infection) following:  Surgery of the chest or abdomen.   Surgery if you have a history of smoking or a lung problem.   A long period of time when you are unable to move or be active.  BEFORE THE PROCEDURE   If the spirometer includes an indictor to show your best effort, your nurse or respiratory therapist will set it to a desired goal.   If possible, sit up straight or lean slightly forward. Try not to slouch.   Hold the incentive spirometer in an upright position.  INSTRUCTIONS FOR USE  1. Sit on the edge of your bed if possible, or sit up as far as you can in bed or on a chair.  2. Hold the incentive spirometer in an upright position.  3. Breathe out normally.  4. Place the mouthpiece in your mouth and seal your lips tightly around it.  5. Breathe in slowly and as deeply as possible, raising the piston or the ball toward the top of the column.  6. Hold your breath for 3-5 seconds or for as long as possible. Allow the piston or ball to fall to the bottom of the column.  7. Remove the mouthpiece from your mouth and breathe out normally.  8. Rest for a few seconds and repeat Steps 1 through 7 at least 10 times every 1-2 hours when you are awake. Take your time and take a few  normal breaths between deep breaths.  9. The spirometer may include an indicator to show your best effort. Use the indicator as a goal to work toward during each repetition.  10. After each set of 10 deep breaths, practice coughing to be sure your lungs are clear. If you have an incision (the cut made at the time of surgery), support your incision when coughing by placing a pillow or rolled up towels firmly against it.  Once you are able to get out of bed, walk around indoors and cough well. You may stop using the incentive spirometer when instructed by your caregiver.  RISKS AND COMPLICATIONS  Breathing too quickly may cause dizziness. At  an extreme, this could cause you to pass out. Take your time so you do not get dizzy or light-headed.   If you are in pain, you may need to take or ask for pain medication before doing incentive spirometry. It is harder to take a deep breath if you are having pain.  AFTER USE  Rest and breathe slowly and easily.   It can be helpful to keep track of a log of your progress. Your caregiver can provide you with a simple table to help with this.  If you are using the spirometer at home, follow these instructions: [place] IF:   You are having difficultly using the spirometer.   You have trouble using the spirometer as often as instructed.   Your pain medication is not giving enough relief while using the spirometer.   You develop fever of 100.5 F (38.1 C) or higher.  SEEK IMMEDIATE MEDICAL CARE IF:   You cough up bloody sputum that had not been present before.   You develop fever of 102 F (38.9 C) or greater.   You develop worsening pain at or near the incision site.  MAKE SURE YOU:   Understand these instructions.   Will watch your condition.   Will get help right away if you are not doing well or get worse.  Document Released: 10/17/2006 Document Revised: 05/26/2011 Document Reviewed: 12/18/2006 [facility] Patient Information 2012 [place]. Surgery for Rotator Cuff Tear with Rehab Rotator cuff surgery is only recommended for individuals who have experienced persistent disability for greater than 3 months of non-surgical (conservative) treatment. Surgery is not necessary but is recommended for individuals who experience difficulty completing daily activities or athletes who are unable to compete. Rotator cuff tears do not usually heal without surgical intervention. If left alone small rotator cuff tears usually become larger. Younger athletes who have a rotator cuff tear may be recommended for surgery without attempting conservative rehabilitation. The purpose of  surgery is to regain function of the shoulder joint and eliminate pain associated with the injury. In addition to repairing the tendon tear, the surgery often removes a portion of the bony roof of the shoulder (acromion) as well as the chronically thickened and inflamed membrane below the acromion (subacromial bursa). REASONS NOT TO OPERATE   Infection of the shoulder.   Inability to complete a rehabilitation program.   Patients who have other conditions (emotional or psychological) conditions that contribute to their shoulder condition.  RISKS AND COMPLICATIONS  Infection.   Re-tear of the rotator cuff tendons or muscles.   Shoulder stiffness and/or weakness.   Inability to compete in athletics.   Acromioclavicular (AC) joint paint.   Risks of surgery: infection, bleeding, nerve damage, or damage to surrounding tissues.  TECHNIQUE There are different surgical procedures used to treat  rotator cuff tears. The type of procedure depends on the extent of injury as well as the surgeon's preference. All of the surgical techniques for rotator cuff tears have the same goal of repairing the torn tendon, removing part of the acromion, and removing the subacromial bursa. There are two main types of procedures: arthroscopic and open incision. Arthroscopic procedures are usually completed and you go home the same day as surgery (outpatient). These procedures use multiple small incisions in which tools and a video camera are placed to work on the shoulder. An electric shaver removes the bursa, then a power burr shaves down the portion of the acromion that places pressure on the rotator cuff. Finally the rotator cuff is sewed (sutured) back to the humeral head. Open incision procedures require a larger incision. The deltoid muscle is detached from the acromion and a ligament in the shoulder (coracoacromial) is cut in order for the surgeon to access the rotator cuff. The subacromial bursa is removed as well  as part of the acromion to give the rotator cuff room to move freely. The torn tendon is then sutured to the humeral head. After the rotator cuff is repaired, then the deltoid is reattached and the incision is closed up.  RECOVERY   Post-operative care depends on the surgical technique and the preferences of your therapist.   Keep the wound clean and dry for the first 10 to 14 days after surgery.   Keep your shoulder and arm in the sling provided to you for as long as you have been instructed to.   You will be given pain medications by your caregiver.   Passive (without using muscles) shoulder movements may be begun immediately after surgery.   It is important to follow through with you rehabilitation program in order to have the best possible recovery.  RETURN TO SPORTS   The rehabilitation period will depend on the sport and position you play as well as the success of the operation.   The minimum recovery period is 6 months.   You must have regained complete shoulder motion and strength before returning to sports.  SEEK IMMEDIATE MEDICAL CARE IF:   Any medications produce adverse side effects.   Any complications from surgery occur:   Pain, numbness, or coldness in the extremity operated upon.   Discoloration of the nail beds (they become blue or gray) of the extremity operated upon.   Signs of infections (fever, pain, inflammation, redness, or persistent bleeding).  EXERCISES  RANGE OF MOTION (ROM) AND STRETCHING EXERCISES - Rotator Cuff Tear, Surgery For These exercises may help you restore your elbow mobility once your physician has discontinued your immobilization period. Beginning these before your provider's approval may result in delayed healing. Your symptoms may resolve with or without further involvement from your physician, physical therapist or athletic trainer. While completing these exercises, remember:   Restoring tissue flexibility helps normal motion to return  to the joints. This allows healthier, less painful movement and activity.   An effective stretch should be held for at least 30 seconds. A stretch should never be painful. You should only feel a gentle lengthening or release in the stretch.  ROM - Pendulum   Bend at the waist so that your right / left arm falls away from your body. Support yourself with your opposite hand on a solid surface, such as a table or a countertop.   Your right / left arm should be perpendicular to the ground. If it is not perpendicular,  you need to lean over farther. Relax the muscles in your right / left arm and shoulder as much as possible.   Gently sway your hips and trunk so they move your right / left arm without any use of your right / left shoulder muscles.   Progress your movements so that your right / left arm moves side to side, then forward and backward, and finally, both clockwise and counterclockwise.   Complete __________ repetitions in each direction. Many people use this exercise to relieve discomfort in their shoulder as well as to gain range of motion.  Repeat __________ times. Complete this exercise __________ times per day. STRETCH - Flexion, Seated   Sit in a firm chair so that your right / left forearm can rest on a table or on a table or countertop. Your right / left elbow should rest below the height of your shoulder so that your shoulder feels supported and not tense or uncomfortable.   Keeping your right / left shoulder relaxed, lean forward at your waist, allowing your right / left hand to slide forward. Bend forward until you feel a moderate stretch in your shoulder, but before you feel an increase in your pain.   Hold __________ seconds. Slowly return to your starting position.  Repeat __________ times. Complete this exercise __________ times per day.  STRETCH - Flexion, Standing   Stand with good posture. With an underhand grip on your right / left and an overhand grip on the opposite  hand, grasp a broomstick or cane so that your hands are a little more than shoulder-width apart.   Keeping your right / left elbow straight and shoulder muscles relaxed, push the stick with your opposite hand to raise your right / left arm in front of your body and then overhead. Raise your arm until you feel a stretch in your right / left shoulder, but before you have increased shoulder pain.   Try to avoid shrugging your right / left shoulder as your arm rises by keeping your shoulder blade tucked down and toward your mid-back spine. Hold __________ seconds.   Slowly return to the starting position.  Repeat __________ times. Complete this exercise __________ times per day.  STRETCH - Abduction, Supine   Stand with good posture. With an underhand grip on your right / left and an overhand grip on the opposite hand, grasp a broomstick or cane so that your hands are a little more than shoulder-width apart.   Keeping your right / left elbow straight and shoulder muscles relaxed, push the stick with your opposite hand to raise your right / left arm out to the side of your body and then overhead. Raise your arm until you feel a stretch in your right / left shoulder, but before you have increased shoulder pain.   Try to avoid shrugging your right / left shoulder as your arm rises by keeping your shoulder blade tucked down and toward your mid-back spine. Hold __________ seconds.   Slowly return to the starting position.  Repeat __________ times. Complete this exercise __________ times per day.  ROM - Flexion, Active-Assisted  Lie on your back. You may bend your knees for comfort.   Grasp a broomstick or cane so your hands are about shoulder-width apart. Your right / left hand should grip the end of the stick/cane so that your hand is positioned "thumbs-up," as if you were about to shake hands.   Using your healthy arm to lead, raise your right /  left arm overhead until you feel a gentle stretch in  your shoulder. Hold __________ seconds.   Use the stick/cane to assist in returning your right / left arm to its starting position.  Repeat __________ times. Complete this exercise __________ times per day.  STRETCH - External Rotation   Tuck a folded towel or small ball under your right / left upper arm. Grasp a broomstick or cane with an underhand grasp a little more than shoulder width apart. Bend your elbows to 90 degrees.   Stand with good posture or sit in a chair without arms.   Use your strong arm to push the stick across your body. Do not allow the towel or ball to fall. This will rotate your right / left arm away from your abdomen. Using the stick turn/rotate your hand and forearm away from your body. Hold __________ seconds.  Repeat __________ times. Complete this exercise __________ times per day.  STRENGTHENING EXERCISES - Rotator Cuff Tear, Surgery For These exercises may help you begin to restore your elbow strength in the initial stage of your rehabilitation. Your physician will determine when you begin these exercises depending on the severity of your injury and the integrity of your repaired tissues. Beginning these before your provider's approval may result in delayed healing. While completing these exercises, remember:   Muscles can gain both the endurance and the strength needed for everyday activities through controlled exercises.   Complete these exercises as instructed by your physician, physical therapist or athletic trainer. Progress the resistance and repetitions only as guided.   You may experience muscle soreness or fatigue, but the pain or discomfort you are trying to eliminate should never worsen during these exercises. If this pain does worsen, stop and make certain you are following the directions exactly. If the pain is still present after adjustments, discontinue the exercise until you can discuss the trouble with your clinician.  STRENGTH - Shoulder Flexion,  Isometric   With good posture and facing a wall, stand or sit about 4-6 inches away.   Keeping your right / left elbow straight, gently press the top of your fist into the wall. Increase the pressure gradually until you are pressing as hard as you can without shrugging your shoulder or increasing any shoulder discomfort.   Hold __________ seconds. Release the tension slowly. Relax your shoulder muscles completely before you do the next repetition.  Repeat __________ times. Complete this exercise __________ times per day.  STRENGTH - Shoulder Abductors, Isometric   With good posture, stand or sit about 4-6 inches from a wall with your right / left side facing the wall.   Bend your right / left elbow. Gently press your right / left elbow into the wall. Increase the pressure gradually until you are pressing as hard as you can without shrugging your shoulder or increasing any shoulder discomfort.   Hold __________ seconds.   Release the tension slowly. Relax your shoulder muscles completely before you do the next repetition.  Repeat __________ times. Complete this exercise __________ times per day.  STRENGTH - Internal Rotators, Isometric   Keep your right / left elbow at your side and bend it 90 degrees.   Step into a door frame so that the inside of your right / left wrist can press against the door frame without your upper arm leaving your side.   Gently press your right / left wrist into the door frame as if you were trying to draw the palm of  your hand to your abdomen. Gradually increase the tension until you are pressing as hard as you can without shrugging your shoulder or increasing any shoulder discomfort.   Hold __________ seconds.   Release the tension slowly. Relax your shoulder muscles completely before you do the next repetition.  Repeat __________ times. Complete this exercise __________ times per day.  STRENGTH - External Rotators, Isometric   Keep your right / left elbow  at your side and bend it 90 degrees.   Step into a door frame so that the outside of your right / left wrist can press against the door frame without your upper arm leaving your side.   Gently press your right / left wrist into the door frame as if you were trying to swing the back of your hand away from your abdomen. Gradually increase the tension until you are pressing as hard as you can without shrugging your shoulder or increasing any shoulder discomfort.   Hold __________ seconds.   Release the tension slowly. Relax your shoulder muscles completely before you do the next repetition.  Repeat __________ times. Complete this exercise __________ times per day.  Document Released: 06/06/2005 Document Revised: 05/26/2011 Document Reviewed: 09/18/2008 [facility] Patient Information 2012 [place].

## 2012-03-16 NOTE — Telephone Encounter (Addendum)
See all related notes. Surgery was re-scheduled to 03/19/12, due to medical clearance request.  Per [employer], Mark, reference 03/13/12, no new prior authorization required.  Also, due to plan of observation stay, 48 hours, no pre-authorization required.  *As of 03/16/12, received notice, per nurse, of need to cancel this surgery date (03/19/12)due to additional medical clearance needed, related to blood sugar.  Pending. Called back to [place], [employer], per [employer], notified of 03/19/12 cancellation.

## 2012-03-16 NOTE — Progress Notes (Signed)
Late entry: At 0830 this morning Dr. Patsey Berthold was notified of glucose 430m/dl and K 3.3 on Bmet. Ordered to notify Dr. HAline Brochureof abnormality. Dr. HAline Brochurenotified but already aware and told pt to see PCP today. No other order received.

## 2012-03-16 NOTE — OR Nursing (Signed)
Due to high glucose, case was cancelled by Dr. Aline Brochure.  Pt. To follow up with primary perJami at Dr. Aline Brochure office.  . Office arranging to have this done.

## 2012-03-19 ENCOUNTER — Ambulatory Visit (HOSPITAL_COMMUNITY): Admission: RE | Admit: 2012-03-19 | Payer: Medicare Other | Source: Ambulatory Visit | Admitting: Orthopedic Surgery

## 2012-03-19 ENCOUNTER — Encounter (HOSPITAL_COMMUNITY): Admission: RE | Payer: Self-pay | Source: Ambulatory Visit

## 2012-03-19 ENCOUNTER — Telehealth: Payer: Self-pay | Admitting: Orthopedic Surgery

## 2012-03-19 SURGERY — REPAIR, ROTATOR CUFF, OPEN
Anesthesia: Choice | Laterality: Right

## 2012-03-19 NOTE — Telephone Encounter (Signed)
[name] [name] wanted you to know she is being sent to an Endocrinologist.  We should be getting notes from the appointment

## 2012-03-23 NOTE — Telephone Encounter (Signed)
Surgery precert documented in chart.

## 2012-04-17 ENCOUNTER — Other Ambulatory Visit: Payer: Self-pay | Admitting: *Deleted

## 2012-04-24 ENCOUNTER — Other Ambulatory Visit (HOSPITAL_COMMUNITY): Payer: Medicare Other

## 2012-04-25 ENCOUNTER — Telehealth: Payer: Self-pay | Admitting: Orthopedic Surgery

## 2012-04-25 NOTE — Telephone Encounter (Signed)
[place] [employer], ph [phone], regarding pre-authorization for re-scheduled surgery date of 05/07/12 for same procedure, CPT 23420 with diagnosis 727.61, and CPT 11173 with 716.91.  Had left voice message, then received voice message back from [place].  Called back, spoke with Jane Canary,   who again stated that if surgery is done as out-patient, or as 48 hour observation stay with option of admission if needed, (which Dr. Aline Brochure approved, per previous notes) then  no pre-authorization is required.  If admission would be required at time or following observation, hospital would need to contact insurer; clinicals would be requested in that event.

## 2012-04-26 ENCOUNTER — Encounter (HOSPITAL_COMMUNITY): Payer: Self-pay

## 2012-04-30 NOTE — Patient Instructions (Addendum)
Your procedure is scheduled on: 05/07/12  Report to [facility] at  6:15   AM.  Call this number if you have problems the morning of surgery: [phone]   Remember:   Do not drink or eat food:After Midnight.  :  Take these medicines the morning of surgery with A SIP OF WATER: Diltiazem, Cymbalta, Synthroid, Lisinopril, Wellbutrin, and use albuterol inhaler   Do not wear jewelry, make-up or nail polish.  Do not wear lotions, powders, or perfumes. You may wear deodorant.  Do not shave 48 hours prior to surgery. Men may shave face and neck.  Do not bring valuables to the hospital.  Contacts, dentures or bridgework may not be worn into surgery.  Leave suitcase in the car. After surgery it may be brought to your room.  For patients admitted to the hospital, checkout time is 11:00 AM the day of discharge.   Patients discharged the day of surgery will not be allowed to drive home.    Special Instructions: Shower using CHG 2 nights before surgery and the night before surgery.  If you shower the day of surgery use CHG.  Use special wash - you have one bottle of CHG for all showers.  You should use approximately 1/3 of the bottle for each shower.   Please read over the following fact sheets that you were given: Pain Booklet, MRSA Information, Surgical Site Infection Prevention and Care and Recovery After Surgery  Shoulder Arthroscopy Because the shoulder is one of the most mobile joints, it is more prone to injury. It is a very shallow ball and socket joint located between the large bone in your upper arm (humerus) and the shoulder blade (scapula). Arthroscopy is a valuable test for evaluating and treating injuries involving the shoulder joint. Arthroscopy is a surgical technique which uses small incisions (cuts by the surgeon) to insert a small telescope like instrument (arthroscope) and other tools into the shoulder. This allows the surgeon to look directly at the problem. When the arthroscope is in  the joint, fluid is used to expand the joint space. This allows the surgeon to examine it more easily. The arthroscope then beams light into the joint and sends an image to a TV screen. As your surgeon examines your shoulder, he or she can also repair a number of problems found at the same time. Sometimes the procedure may change to an open surgery. This would happen if the problems are severe enough that they cannot be corrected with just arthroscopy. This is usually a very safe surgery. Rare complications include damage to nerves or blood vessels, excess bleeding, blood clots, infection, and rarely instrument failure. This is most often performed as a same day surgery. This means you will not have to stay in the hospital overnight. Recovery from this surgery is also much faster than having an open procedure. LET YOUR CAREGIVER KNOW ABOUT:  Allergies.  Medications taken including herbs, eye drops, over the counter medications, and creams.  Use of steroids (by mouth or creams).  Previous problems with anesthetics or novocaine.  Possibility of pregnancy, if this applies.  History of blood clots (thrombophlebitis).  History of bleeding or blood problems.  Previous surgery.  Other health problems.  Family history of anesthetic problems. BEFORE THE PROCEDURE   Stop all anti-inflammatory medications at least one week before surgery unless instructed otherwise. Tell your surgeon if you have been taking cortisone or other steroids.  Do not eat or drink after midnight or as instructed.  Take medications as directed by your caregiver. You may have lab tests the morning of surgery.  You should be present 60 minutes prior to your procedure or as directed. PROCEDURE  You may have general (go to sleep) or local (numb the area) anesthetic. Your surgeon will discuss this with you. During the procedure as discussed above, your surgeon may find a variety of problems which he or she can improve or correct  using small instruments. When the procedure is finished the tiny incisions will be closed with stitches or tape. AFTER YOUR PROCEDURE  After surgery you will be taken to the recovery area. A nurse will watch and check your progress. Once you are awake, stable, and taking fluids well, barring other problems you will be allowed to go home.  Once home, apply an ice pack to your operative site for twenty minutes, three to four times per day, for two to three days. This may help with discomfort and keep the swelling down.  Use a sling and medications if prescribed or as instructed.  Unless your caregiver advises otherwise, move your arm and shoulder gently and frequently following the procedure. This can help prevent stiffness and swelling. REHABILITATION  Almost as important as your surgery is your rehabilitation. If physical therapy and exercises are prescribed by your surgeon, follow them diligently. Once comfortable and on your way to full use, do muscle strengthening exercises as instructed.  Only take over-the-counter or prescription medicines for pain, discomfort, or fever as directed by your caregiver. SEEK IMMEDIATE MEDICAL CARE IF:   There is redness, swelling, or increasing pain in the wound or joint.  You notice purulent (colored- pus-like) drainage coming from the wound.  An unexplained oral temperature above 102 F (38.9 C) develops.  You notice a foul smell coming from the wound or dressing.  There is a breaking open of the wound. The edges do not stay together after sutures or tape has been removed.  Persistent bleeding from the small incision. Document Released: 06/03/2000 Document Revised: 08/29/2011 Document Reviewed: 09/22/2008 [facility] Patient Information 2013 [place].  PATIENT INSTRUCTIONS POST-ANESTHESIA  IMMEDIATELY FOLLOWING SURGERY:  Do not drive or operate machinery for the first twenty four hours after surgery.  Do not make any important decisions for  twenty four hours after surgery or while taking narcotic pain medications or sedatives.  If you develop intractable nausea and vomiting or a severe headache please notify your doctor immediately.  FOLLOW-UP:  Please make an appointment with your surgeon as instructed. You do not need to follow up with anesthesia unless specifically instructed to do so.  WOUND CARE INSTRUCTIONS (if applicable):  Keep a dry clean dressing on the anesthesia/puncture wound site if there is drainage.  Once the wound has quit draining you may leave it open to air.  Generally you should leave the bandage intact for twenty four hours unless there is drainage.  If the epidural site drains for more than 36-48 hours please call the anesthesia department.  QUESTIONS?:  Please feel free to call your physician or the hospital operator if you have any questions, and they will be happy to assist you.

## 2012-05-01 ENCOUNTER — Encounter (HOSPITAL_COMMUNITY): Payer: Self-pay

## 2012-05-01 ENCOUNTER — Encounter (HOSPITAL_COMMUNITY)
Admission: RE | Admit: 2012-05-01 | Discharge: 2012-05-01 | Disposition: A | Discharge status: Home / Self Care | Payer: Medicare Other | Source: Ambulatory Visit | Attending: Orthopedic Surgery | Admitting: Orthopedic Surgery

## 2012-05-01 LAB — CBC
Hemoglobin: 12.3 g/dL (ref 12.0–15.0)
MCHC: 33.4 g/dL (ref 30.0–36.0)
RBC: 4.17 MIL/uL (ref 3.87–5.11)
WBC: 6.8 10*3/uL (ref 4.0–10.5)

## 2012-05-01 LAB — BASIC METABOLIC PANEL
CO2: 25 mEq/L (ref 19–32)
GFR calc non Af Amer: >90 mL/min (ref >90)
Glucose, Bld: 125 mg/dL — ABNORMAL HIGH (ref 70–99)
Potassium: 3.7 mEq/L (ref 3.5–5.1)
Sodium: 140 mEq/L (ref 135–145)

## 2012-05-01 LAB — SURGICAL PCR SCREEN
MRSA, PCR: NEGATIVE
Staphylococcus aureus: POSITIVE — AB

## 2012-05-01 MED ORDER — CHLORHEXIDINE GLUCONATE 4 % EX LIQD: 60.0000 mL | Freq: Once | CUTANEOUS | Status: DC

## 2012-05-03 NOTE — H&P (Addendum)
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 She reports sharp, dull, throbbing, stabbing, burning 10 over 10, constant pain in the RIGHT shoulder that nothing makes better in every motion makes worse. She has lost motion in the shoulder. She has weakness catching, and she cannot do her routine activities.  She has a history of a coronary catheterization. Last May by Dr. Lia Foyer, and was told that her heart was fine. She does have some respiratory disease with COPD, type symptoms, including sleep apnea. She has a sleep apnea machine, but doesn't use it. Instead, she sleeps upright in a recliner.   The patient was scheduled for surgery and then had some medical complications that require preoperative evaluation and she was sent for clearance and was able to obtain medical clearance and presents back for surgical treatment of her right shoulder.   The following portions of the patient's history were reviewed and updated as appropriate: allergies, current medications, past family history, past medical history, past social history, past surgical history and problem list.  Past Medical History   Diagnosis  Date   .  Type 2 diabetes mellitus    .  Essential hypertension, benign    .  GERD (gastroesophageal reflux disease)    .  Edema      feet and legs   .  Morbid obesity    .  COPD (chronic obstructive pulmonary disease)    .  Thyroid disease    .  Asthma    .  Arthritis    .  Depression    .  Cirrhosis of liver    .  Hypothyroidism  2009     thyroid cancer   .  Cancer      thyroid    Past Surgical History   Procedure  Date   .  Thyroidectomy  2009   .  Cholecystectomy  1996   .  Tonsillectomy  1958   .  Foot surgery  2000     bone spur removed   .  Abdominal hysterectomy  1985   .  Exploratory  laparotomy  2003   .  Umbilical hernia repair  2010   .  Debridement of abdominal wound  2011   .  Cesarean section  1977   .  Cardiac catheterization  2012    History    Social History   .  Marital Status:  Widowed     Spouse Name:  N/A     Number of Children:  N/A   .  Years of Education:  12    Occupational History   .  Full-time      [employer] for daycare    Social History Main Topics   .  Smoking status:  Never Smoker   .  Smokeless tobacco:  Never Used   .  Alcohol Use:  No   .  Drug Use:  No   .  Sexually Active:  Yes     Birth Control/ Protection:  Surgical    Other Topics  Concern   .  Not on file    Social History Narrative   .  No narrative on file   family history includes Arthritis in an unspecified family member; Asthma in an unspecified family member; Cancer in an unspecified family member; Diabetes  in an unspecified family member; Heart disease in an unspecified family member; and Kidney disease in an unspecified family member.  No current facility-administered medications on file prior to encounter.    No current facility-administered medications on file prior to encounter.   Current Outpatient Prescriptions on File Prior to Encounter  Medication Sig Dispense Refill  . albuterol (PROVENTIL HFA) 108 (90 BASE) MCG/ACT inhaler Inhale 2 puffs into the lungs every 6 (six) hours as needed. Shortness of breath      . aspirin 81 MG EC tablet Take 81 mg by mouth daily.       [name] buPROPion (WELLBUTRIN SR) 150 MG 12 hr tablet Take 150 mg by mouth 2 (two) times daily.       . CELEBREX 200 MG capsule Take 200 mg by mouth daily.       . colesevelam (WELCHOL) 625 MG tablet Take 1,875 mg by mouth 2 (two) times daily with a meal. 3 tabs am 3 tabs pm      . diltiazem (CARDIZEM CD) 240 MG 24 hr capsule Take 240 mg by mouth daily.       . DULoxetine (CYMBALTA) 60 MG capsule Take 120 mg by mouth daily.       . furosemide (LASIX) 80 MG tablet Take 80 mg by mouth daily.       .  insulin glargine (LANTUS SOLOSTAR) 100 UNIT/ML injection Inject 15 Units into the skin daily.      [name] levothyroxine (SYNTHROID, LEVOTHROID) 300 MCG tablet Take 300 mcg by mouth daily.        [name] linagliptin (TRADJENTA) 5 MG TABS tablet Take 5 mg by mouth daily.      [name] liothyronine (CYTOMEL) 5 MCG tablet Take 5 mcg by mouth daily.      [name] lisinopril (PRINIVIL,ZESTRIL) 40 MG tablet Take 40 mg by mouth daily.      [name] nystatin (NYSTOP) 100000 UNIT/GM POWD Apply 100,000 g topically daily. Rash      . simvastatin (ZOCOR) 20 MG tablet Take 20 mg by mouth daily.       . traMADol (ULTRAM) 50 MG tablet Take 50 mg by mouth every 6 (six) hours as needed. Pain                                                                                                             Review of Systems  the systems are reviewed with the following positive findings, chest pain, with palpitations, current, shortness of breath, wheezing, tightness snoring, sleep apnea, heartburn, constipation, diarrhea, frequency, and urgency. Skin rash itching and redness poor healing, multiple cat scratches. Dizziness, anxiety, and depression. Excessive thirst heat intolerance, seasonal allergies, blurred vision. Ordering of the eyes. correction watering of the eyes.  Negative findings include weight loss or weight gain, fever or chills  Objective:  BP 144/80  Ht 5' 4"  (1.626 m)  Wt 338 lb (153.316 kg)  BMI 58.02 kg/m2  Vital signs are stable as recorded  General appearance is normal, obesity  The patient is alert  and oriented x3  The patient's mood and affect are normal  Gait assessment: normal waddling  The cardiovascular exam reveals normal pulses and temperature without edema swelling.  The lymphatic system is negative for palpable lymph nodes  The sensory exam is normal.  There are no pathologic reflexes.  Balance is normal.  Exam of the right shoulder  Inspection elevation of the RIGHT shoulder only to 90, requiring  assistance to get the shoulder to 120. Painful range of motion arthropathy. Range of motion. Passive range of motion 120. The shoulder remained stable. The subscapular strength is normal. The external rotators are weak. The supraspinatus tendon. The skin is intact, but there are multiple scratches from her cat  Left shoulder FROM, NORMAL STRENGTH, NO INSTABILITY NO TENDERNESS  Lower extremity exam:  Inspection and palpation revealed no tenderness or abnormality in alignment in the lower extremities.  Range of motion is full. Strength is grade 5.  all joints are stable.  Imaging. Shoulder x-ray shows a.c. Joint arthrosis.  MRI done in an open unit did show some motion artifact, but the supraspinatus and infraspinatus tendon appeared to be torn with retraction to the humeral head. There is a.c. Joint arthrosis noted. Large effusion noted.  Assessment:  Right supraspinatus and infraspinatus tendon tear, complete with retraction. No atrophy   Plan: Neurosurgeon distributed.   a complete discussion was performed regarding possible treatment options. To simply get her back to her functional level, and cooking. It would require surgical repair. She is okay with doing this understanding that she will have him a three-month period where she cannot do any lifting and then a 6 month period total where she can't lift any pots. He'll take a year for her shoulder to feel normal.  She understands that she is at high risk for respiratory problems during or after surgery, as well as diabetes, which may increase her infection. Wrist. We explained procedure, including the use of suture anchors and repair of the cuff and removal of the distal clavicle.  You have been scheduled for rotator cuff surgery. All surgeries carry some risk. Remember you always have the option of continued nonsurgical treatment. However in this situation the risks vs. the benefits favor surgery as the best treatment option. The risks of  the surgery includes the following but is not limited to bleeding, infection, pulmonary embolus, death from anesthesia, nerve injury vascular injury or need for further surgery, continued pain.  Specific to this procedure the following risks and complications are rare but possible  Stiffness, pain, weakness, re tear.  I expect 9-12 months to fully recover from this procedure.   Open repair right rotator cuff for rotator cuff tear right shoulder   PS the MRI was done at [place] ordered by [facility]  Although it was a motion limited study there is a full-thickness supraspinatus and infraspinatus tear with medial tendon retraction and a.c. joint arthrosis narrowing the subacromial outlet, large amount of subacromial subdeltoid bursal fluid, tendon retracted to the 12:00 position of the humeral head.

## 2012-05-07 ENCOUNTER — Observation Stay (HOSPITAL_COMMUNITY)
Admission: RE | Admit: 2012-05-07 | Discharge: 2012-05-09 | Disposition: A | Discharge status: Home / Self Care | Payer: Medicare Other | Source: Ambulatory Visit | Attending: Orthopedic Surgery | Admitting: Orthopedic Surgery

## 2012-05-07 ENCOUNTER — Encounter (HOSPITAL_COMMUNITY)
Admission: RE | Disposition: A | Discharge status: Home / Self Care | Payer: Self-pay | Source: Ambulatory Visit | Attending: Orthopedic Surgery

## 2012-05-07 ENCOUNTER — Ambulatory Visit (HOSPITAL_COMMUNITY): Payer: Medicare Other | Admitting: Anesthesiology

## 2012-05-07 ENCOUNTER — Encounter (HOSPITAL_COMMUNITY): Payer: Self-pay | Admitting: *Deleted

## 2012-05-07 ENCOUNTER — Encounter (HOSPITAL_COMMUNITY): Payer: Self-pay | Admitting: Anesthesiology

## 2012-05-07 DIAGNOSIS — E119 Type 2 diabetes mellitus without complications: Secondary | ICD-10-CM | POA: Insufficient documentation

## 2012-05-07 DIAGNOSIS — M19019 Primary osteoarthritis, unspecified shoulder: Secondary | ICD-10-CM | POA: Insufficient documentation

## 2012-05-07 DIAGNOSIS — Z01812 Encounter for preprocedural laboratory examination: Secondary | ICD-10-CM | POA: Insufficient documentation

## 2012-05-07 DIAGNOSIS — J45909 Unspecified asthma, uncomplicated: Secondary | ICD-10-CM | POA: Insufficient documentation

## 2012-05-07 DIAGNOSIS — M75120 Complete rotator cuff tear or rupture of unspecified shoulder, not specified as traumatic: Principal | ICD-10-CM | POA: Insufficient documentation

## 2012-05-07 DIAGNOSIS — Z01810 Encounter for preprocedural cardiovascular examination: Secondary | ICD-10-CM | POA: Insufficient documentation

## 2012-05-07 DIAGNOSIS — I10 Essential (primary) hypertension: Secondary | ICD-10-CM | POA: Insufficient documentation

## 2012-05-07 HISTORY — PX: SHOULDER OPEN ROTATOR CUFF REPAIR: SHX2407

## 2012-05-07 HISTORY — PX: RESECTION DISTAL CLAVICAL: SHX5053

## 2012-05-07 SURGERY — EXCISION, CLAVICLE, DISTAL, OPEN
Anesthesia: General | Site: Shoulder | Laterality: Right | Wound class: Contaminated

## 2012-05-07 MED ORDER — CELECOXIB 100 MG PO CAPS
200.0000 mg | ORAL_CAPSULE | Freq: Every day | ORAL | Status: DC
  Administered 2012-05-07 – 2012-05-09 (×3): 200 mg via ORAL
  Filled 2012-05-07 (×3): qty 2

## 2012-05-07 MED ORDER — SUCCINYLCHOLINE CHLORIDE 20 MG/ML IJ SOLN
INTRAMUSCULAR | Status: DC | PRN
  Administered 2012-05-07: 120 mg via INTRAVENOUS

## 2012-05-07 MED ORDER — ONDANSETRON HCL 4 MG/2ML IJ SOLN
INTRAMUSCULAR | Status: AC
  Filled 2012-05-07: qty 2

## 2012-05-07 MED ORDER — NEOSTIGMINE METHYLSULFATE 1 MG/ML IJ SOLN
INTRAMUSCULAR | Status: AC
  Filled 2012-05-07: qty 10

## 2012-05-07 MED ORDER — VANCOMYCIN HCL 1000 MG IV SOLR
1500.0000 mg | INTRAVENOUS | Status: DC
  Filled 2012-05-07: qty 1500

## 2012-05-07 MED ORDER — GLYCOPYRROLATE 0.2 MG/ML IJ SOLN
INTRAMUSCULAR | Status: AC
  Filled 2012-05-07: qty 3

## 2012-05-07 MED ORDER — DIPHENHYDRAMINE HCL 12.5 MG/5ML PO ELIX
12.5000 mg | ORAL_SOLUTION | ORAL | Status: DC | PRN
  Administered 2012-05-08: 25 mg via ORAL
  Administered 2012-05-08: 12.5 mg via ORAL
  Filled 2012-05-07: qty 5
  Filled 2012-05-07: qty 10

## 2012-05-07 MED ORDER — LISINOPRIL 10 MG PO TABS
40.0000 mg | ORAL_TABLET | Freq: Every day | ORAL | Status: DC
  Administered 2012-05-08 – 2012-05-09 (×2): 40 mg via ORAL
  Filled 2012-05-07 (×2): qty 4

## 2012-05-07 MED ORDER — VANCOMYCIN HCL 1000 MG IV SOLR
1000.0000 mg | INTRAVENOUS | Status: DC | PRN
  Administered 2012-05-07: 1500 mg via INTRAVENOUS

## 2012-05-07 MED ORDER — FENTANYL CITRATE 0.05 MG/ML IJ SOLN
INTRAMUSCULAR | Status: AC
  Filled 2012-05-07: qty 2

## 2012-05-07 MED ORDER — METHOCARBAMOL 100 MG/ML IJ SOLN
500.0000 mg | Freq: Once | INTRAVENOUS | Status: AC
  Administered 2012-05-07: 500 mg via INTRAVENOUS
  Filled 2012-05-07: qty 5

## 2012-05-07 MED ORDER — INSULIN GLARGINE 100 UNIT/ML ~~LOC~~ SOLN
15.0000 [IU] | Freq: Every day | SUBCUTANEOUS | Status: DC
  Administered 2012-05-07 – 2012-05-09 (×3): 15 [IU] via SUBCUTANEOUS

## 2012-05-07 MED ORDER — EPHEDRINE SULFATE 50 MG/ML IJ SOLN
INTRAMUSCULAR | Status: DC | PRN
  Administered 2012-05-07 (×3): 10 mg via INTRAVENOUS

## 2012-05-07 MED ORDER — SUCCINYLCHOLINE CHLORIDE 20 MG/ML IJ SOLN
INTRAMUSCULAR | Status: AC
  Filled 2012-05-07: qty 1

## 2012-05-07 MED ORDER — CELECOXIB 100 MG PO CAPS
400.0000 mg | ORAL_CAPSULE | Freq: Once | ORAL | Status: AC
  Administered 2012-05-07: 400 mg via ORAL

## 2012-05-07 MED ORDER — SODIUM CHLORIDE 0.9 % IV SOLN
INTRAVENOUS | Status: DC
  Administered 2012-05-07: 15:00:00 via INTRAVENOUS

## 2012-05-07 MED ORDER — LIDOCAINE HCL (CARDIAC) 10 MG/ML IV SOLN
INTRAVENOUS | Status: DC | PRN
  Administered 2012-05-07: 50 mg via INTRAVENOUS

## 2012-05-07 MED ORDER — FENTANYL CITRATE 0.05 MG/ML IJ SOLN
25.0000 ug | INTRAMUSCULAR | Status: DC | PRN
  Administered 2012-05-07 (×3): 50 ug via INTRAVENOUS

## 2012-05-07 MED ORDER — METHOCARBAMOL 100 MG/ML IJ SOLN
500.0000 mg | Freq: Four times a day (QID) | INTRAVENOUS | Status: DC
  Filled 2012-05-07 (×9): qty 5

## 2012-05-07 MED ORDER — DILTIAZEM HCL ER COATED BEADS 240 MG PO CP24
240.0000 mg | ORAL_CAPSULE | Freq: Every day | ORAL | Status: DC
  Administered 2012-05-08 – 2012-05-09 (×2): 240 mg via ORAL
  Filled 2012-05-07 (×2): qty 1

## 2012-05-07 MED ORDER — HYDROMORPHONE HCL PF 1 MG/ML IJ SOLN
0.5000 mg | INTRAMUSCULAR | Status: DC | PRN
  Administered 2012-05-07 – 2012-05-08 (×4): 1 mg via INTRAVENOUS
  Filled 2012-05-07 (×4): qty 1

## 2012-05-07 MED ORDER — ONDANSETRON HCL 4 MG/2ML IJ SOLN: 4.0000 mg | Freq: Once | INTRAMUSCULAR | Status: DC | PRN

## 2012-05-07 MED ORDER — CELECOXIB 100 MG PO CAPS
ORAL_CAPSULE | ORAL | Status: AC
  Filled 2012-05-07: qty 4

## 2012-05-07 MED ORDER — COLESEVELAM HCL 625 MG PO TABS
1875.0000 mg | ORAL_TABLET | Freq: Two times a day (BID) | ORAL | Status: DC
  Administered 2012-05-07 – 2012-05-09 (×4): 1875 mg via ORAL
  Filled 2012-05-07 (×8): qty 3

## 2012-05-07 MED ORDER — PHENYLEPHRINE HCL 10 MG/ML IJ SOLN
INTRAMUSCULAR | Status: DC | PRN
  Administered 2012-05-07 (×5): 100 ug via INTRAVENOUS

## 2012-05-07 MED ORDER — ACETAMINOPHEN 10 MG/ML IV SOLN
INTRAVENOUS | Status: AC
  Filled 2012-05-07: qty 100

## 2012-05-07 MED ORDER — BUPIVACAINE-EPINEPHRINE PF 0.5-1:200000 % IJ SOLN
INTRAMUSCULAR | Status: AC
  Filled 2012-05-07: qty 20

## 2012-05-07 MED ORDER — ACETAMINOPHEN 10 MG/ML IV SOLN
1000.0000 mg | Freq: Once | INTRAVENOUS | Status: AC
  Administered 2012-05-07: 1000 mg via INTRAVENOUS

## 2012-05-07 MED ORDER — MENTHOL 3 MG MT LOZG: 1.0000 | LOZENGE | OROMUCOSAL | Status: DC | PRN

## 2012-05-07 MED ORDER — FENTANYL CITRATE 0.05 MG/ML IJ SOLN
INTRAMUSCULAR | Status: AC
  Filled 2012-05-07: qty 5

## 2012-05-07 MED ORDER — LACTATED RINGERS IV SOLN
INTRAVENOUS | Status: DC | PRN
  Administered 2012-05-07 (×2): via INTRAVENOUS

## 2012-05-07 MED ORDER — SIMVASTATIN 20 MG PO TABS
20.0000 mg | ORAL_TABLET | Freq: Every day | ORAL | Status: DC
  Administered 2012-05-07 – 2012-05-08 (×2): 20 mg via ORAL
  Filled 2012-05-07 (×2): qty 1

## 2012-05-07 MED ORDER — MIDAZOLAM HCL 2 MG/2ML IJ SOLN
INTRAMUSCULAR | Status: AC
  Filled 2012-05-07: qty 2

## 2012-05-07 MED ORDER — SODIUM CHLORIDE 0.9 % IR SOLN
Status: DC | PRN
  Administered 2012-05-07 (×2): 1000 mL

## 2012-05-07 MED ORDER — HYDROCODONE-ACETAMINOPHEN 10-325 MG PO TABS
1.0000 | ORAL_TABLET | ORAL | Status: DC
  Administered 2012-05-07 – 2012-05-09 (×12): 1 via ORAL
  Filled 2012-05-07 (×12): qty 1

## 2012-05-07 MED ORDER — ONDANSETRON HCL 4 MG PO TABS: 4.0000 mg | ORAL_TABLET | Freq: Four times a day (QID) | ORAL | Status: DC | PRN

## 2012-05-07 MED ORDER — GLYCOPYRROLATE 0.2 MG/ML IJ SOLN
INTRAMUSCULAR | Status: DC | PRN
  Administered 2012-05-07: 0.6 mg via INTRAVENOUS

## 2012-05-07 MED ORDER — LACTATED RINGERS IV SOLN
INTRAVENOUS | Status: DC
Start: 2012-05-07 — End: 2012-05-07
  Administered 2012-05-07: 07:00:00 via INTRAVENOUS

## 2012-05-07 MED ORDER — LEVOTHYROXINE SODIUM 150 MCG PO TABS
300.0000 ug | ORAL_TABLET | Freq: Every day | ORAL | Status: DC
  Administered 2012-05-08 – 2012-05-09 (×2): 300 ug via ORAL
  Filled 2012-05-07 (×4): qty 2

## 2012-05-07 MED ORDER — DOCUSATE SODIUM 100 MG PO CAPS
100.0000 mg | ORAL_CAPSULE | Freq: Two times a day (BID) | ORAL | Status: DC
  Administered 2012-05-07 – 2012-05-09 (×5): 100 mg via ORAL
  Filled 2012-05-07 (×5): qty 1

## 2012-05-07 MED ORDER — METFORMIN HCL 500 MG PO TABS
1000.0000 mg | ORAL_TABLET | Freq: Two times a day (BID) | ORAL | Status: DC
  Administered 2012-05-07 – 2012-05-09 (×4): 1000 mg via ORAL
  Filled 2012-05-07 (×4): qty 2

## 2012-05-07 MED ORDER — MIDAZOLAM HCL 2 MG/2ML IJ SOLN
1.0000 mg | INTRAMUSCULAR | Status: DC | PRN
  Administered 2012-05-07: 2 mg via INTRAVENOUS

## 2012-05-07 MED ORDER — BUPIVACAINE-EPINEPHRINE PF 0.5-1:200000 % IJ SOLN
INTRAMUSCULAR | Status: DC | PRN
  Administered 2012-05-07: 60 mL

## 2012-05-07 MED ORDER — OXYCODONE HCL 5 MG PO TABS
ORAL_TABLET | ORAL | Status: AC
  Filled 2012-05-07: qty 1

## 2012-05-07 MED ORDER — VANCOMYCIN HCL IN DEXTROSE 1-5 GM/200ML-% IV SOLN
INTRAVENOUS | Status: AC
  Filled 2012-05-07: qty 200

## 2012-05-07 MED ORDER — METOCLOPRAMIDE HCL 5 MG/ML IJ SOLN: 5.0000 mg | Freq: Three times a day (TID) | INTRAMUSCULAR | Status: DC | PRN

## 2012-05-07 MED ORDER — KETOROLAC TROMETHAMINE 30 MG/ML IJ SOLN
30.0000 mg | Freq: Four times a day (QID) | INTRAMUSCULAR | Status: DC
  Administered 2012-05-07 – 2012-05-09 (×8): 30 mg via INTRAVENOUS
  Filled 2012-05-07 (×9): qty 1

## 2012-05-07 MED ORDER — LINAGLIPTIN 5 MG PO TABS
5.0000 mg | ORAL_TABLET | Freq: Every day | ORAL | Status: DC
Start: 2012-05-07 — End: 2012-05-09
  Administered 2012-05-07 – 2012-05-09 (×3): 5 mg via ORAL
  Filled 2012-05-07 (×3): qty 1

## 2012-05-07 MED ORDER — ACETAMINOPHEN 650 MG RE SUPP: 650.0000 mg | Freq: Four times a day (QID) | RECTAL | Status: DC | PRN

## 2012-05-07 MED ORDER — OXYCODONE HCL 5 MG PO TABS
5.0000 mg | ORAL_TABLET | Freq: Once | ORAL | Status: AC
  Administered 2012-05-07: 5 mg via ORAL

## 2012-05-07 MED ORDER — VANCOMYCIN HCL IN DEXTROSE 1-5 GM/200ML-% IV SOLN
1000.0000 mg | Freq: Three times a day (TID) | INTRAVENOUS | Status: AC
  Administered 2012-05-07 – 2012-05-08 (×2): 1000 mg via INTRAVENOUS
  Filled 2012-05-07 (×2): qty 200

## 2012-05-07 MED ORDER — PROPOFOL 10 MG/ML IV EMUL
INTRAVENOUS | Status: DC | PRN
  Administered 2012-05-07: 150 mg via INTRAVENOUS

## 2012-05-07 MED ORDER — METOCLOPRAMIDE HCL 10 MG PO TABS
5.0000 mg | ORAL_TABLET | Freq: Three times a day (TID) | ORAL | Status: DC | PRN
  Filled 2012-05-07: qty 2

## 2012-05-07 MED ORDER — SODIUM CHLORIDE 0.9 % IV SOLN
INTRAVENOUS | Status: DC | PRN
  Administered 2012-05-07: 07:00:00 via INTRAVENOUS

## 2012-05-07 MED ORDER — LIDOCAINE HCL (PF) 1 % IJ SOLN
INTRAMUSCULAR | Status: AC
  Filled 2012-05-07: qty 5

## 2012-05-07 MED ORDER — METHOCARBAMOL 500 MG PO TABS
500.0000 mg | ORAL_TABLET | Freq: Four times a day (QID) | ORAL | Status: DC
  Administered 2012-05-07 – 2012-05-09 (×8): 500 mg via ORAL
  Filled 2012-05-07 (×7): qty 1

## 2012-05-07 MED ORDER — ALUM & MAG HYDROXIDE-SIMETH 200-200-20 MG/5ML PO SUSP: 30.0000 mL | ORAL | Status: DC | PRN

## 2012-05-07 MED ORDER — NEOSTIGMINE METHYLSULFATE 1 MG/ML IJ SOLN
INTRAMUSCULAR | Status: DC | PRN
  Administered 2012-05-07: 4 mg via INTRAVENOUS

## 2012-05-07 MED ORDER — ONDANSETRON HCL 4 MG/2ML IJ SOLN
4.0000 mg | Freq: Four times a day (QID) | INTRAMUSCULAR | Status: DC | PRN
  Administered 2012-05-07 – 2012-05-08 (×2): 4 mg via INTRAVENOUS
  Filled 2012-05-07 (×2): qty 2

## 2012-05-07 MED ORDER — FUROSEMIDE 80 MG PO TABS
80.0000 mg | ORAL_TABLET | Freq: Every day | ORAL | Status: DC
  Administered 2012-05-07 – 2012-05-09 (×3): 80 mg via ORAL
  Filled 2012-05-07 (×3): qty 1

## 2012-05-07 MED ORDER — PHENYLEPHRINE HCL 10 MG/ML IJ SOLN
INTRAMUSCULAR | Status: AC
  Filled 2012-05-07: qty 1

## 2012-05-07 MED ORDER — ALBUTEROL SULFATE HFA 108 (90 BASE) MCG/ACT IN AERS: 2.0000 | INHALATION_SPRAY | Freq: Four times a day (QID) | RESPIRATORY_TRACT | Status: DC | PRN

## 2012-05-07 MED ORDER — EPHEDRINE SULFATE 50 MG/ML IJ SOLN
INTRAMUSCULAR | Status: AC
  Filled 2012-05-07: qty 1

## 2012-05-07 MED ORDER — PHENOL 1.4 % MT LIQD: 1.0000 | OROMUCOSAL | Status: DC | PRN

## 2012-05-07 MED ORDER — LIOTHYRONINE SODIUM 5 MCG PO TABS
5.0000 ug | ORAL_TABLET | Freq: Every day | ORAL | Status: DC
  Administered 2012-05-08 – 2012-05-09 (×2): 5 ug via ORAL

## 2012-05-07 MED ORDER — OXYCODONE HCL 5 MG PO TABS
5.0000 mg | ORAL_TABLET | ORAL | Status: DC | PRN
  Administered 2012-05-08 (×2): 10 mg via ORAL
  Filled 2012-05-07 (×2): qty 2

## 2012-05-07 MED ORDER — FENTANYL CITRATE 0.05 MG/ML IJ SOLN
INTRAMUSCULAR | Status: DC | PRN
  Administered 2012-05-07 (×2): 100 ug via INTRAVENOUS
  Administered 2012-05-07 (×3): 50 ug via INTRAVENOUS

## 2012-05-07 MED ORDER — DULOXETINE HCL 60 MG PO CPEP
120.0000 mg | ORAL_CAPSULE | Freq: Every day | ORAL | Status: DC
  Administered 2012-05-08 – 2012-05-09 (×2): 120 mg via ORAL
  Filled 2012-05-07 (×2): qty 2

## 2012-05-07 MED ORDER — PROPOFOL 10 MG/ML IV EMUL
INTRAVENOUS | Status: AC
  Filled 2012-05-07: qty 20

## 2012-05-07 MED ORDER — ROCURONIUM BROMIDE 100 MG/10ML IV SOLN
INTRAVENOUS | Status: DC | PRN
  Administered 2012-05-07: 10 mg via INTRAVENOUS

## 2012-05-07 MED ORDER — NYSTATIN 100000 UNIT/GM EX POWD
100000.0000 g | Freq: Every day | CUTANEOUS | Status: DC
  Administered 2012-05-08 – 2012-05-09 (×2): 100000 g via TOPICAL
  Filled 2012-05-07: qty 15

## 2012-05-07 MED ORDER — ACETAMINOPHEN 325 MG PO TABS: 650.0000 mg | ORAL_TABLET | Freq: Four times a day (QID) | ORAL | Status: DC | PRN

## 2012-05-07 MED ORDER — BUPROPION HCL ER (SR) 150 MG PO TB12
150.0000 mg | ORAL_TABLET | Freq: Two times a day (BID) | ORAL | Status: DC
  Administered 2012-05-07 – 2012-05-09 (×4): 150 mg via ORAL
  Filled 2012-05-07 (×9): qty 1

## 2012-05-07 MED ORDER — ONDANSETRON HCL 4 MG/2ML IJ SOLN
4.0000 mg | Freq: Once | INTRAMUSCULAR | Status: AC
  Administered 2012-05-07: 4 mg via INTRAVENOUS

## 2012-05-07 SURGICAL SUPPLY — 73 items
ANCHOR PEEK 5.5MM (Anchor) ×6 IMPLANT
BAG HAMPER (MISCELLANEOUS) ×3 IMPLANT
BIT DRILL 2.0X128 (BIT) ×3 IMPLANT
BLADE HEX COATED 2.75 (ELECTRODE) ×3 IMPLANT
BLADE OSC/SAGITTAL MD 9X18.5 (BLADE) ×3 IMPLANT
BLADE SURG 15 STRL LF DISP TIS (BLADE) ×2 IMPLANT
BLADE SURG 15 STRL SS (BLADE) ×1
BLADE SURG SZ10 CARB STEEL (BLADE) IMPLANT
BNDG COHESIVE 4X5 TAN NS LF (GAUZE/BANDAGES/DRESSINGS) ×3 IMPLANT
BUR FAST CUTTING (BURR)
BUR SRG 54X4.7X12 FLUT (BURR) IMPLANT
BURR SRG 54X4.7X12 FLUT (BURR)
CATH KIT ON Q 2.5IN SLV (PAIN MANAGEMENT) IMPLANT
CHLORAPREP W/TINT 26ML (MISCELLANEOUS) IMPLANT
CLOTH BEACON ORANGE TIMEOUT ST (SAFETY) ×3 IMPLANT
CONNECTOR PERFECT PASSER (CONNECTOR) ×3 IMPLANT
COOLER CRYO CUFF IC AND MOTOR (MISCELLANEOUS) ×3 IMPLANT
COVER LIGHT HANDLE STERIS (MISCELLANEOUS) ×6 IMPLANT
COVER MAYO STAND XLG (DRAPE) ×3 IMPLANT
CUFF CRYO UNI SHDR 32X48 (MISCELLANEOUS) ×3 IMPLANT
DECANTER SPIKE VIAL GLASS SM (MISCELLANEOUS) ×6 IMPLANT
DRAPE ORTHO 2.5IN SPLIT 77X108 (DRAPES) ×4 IMPLANT
DRAPE ORTHO SPLIT 77X108 STRL (DRAPES) ×2
DRAPE U-SHAPE 47X51 STRL (DRAPES) ×3 IMPLANT
DRESSING ALLEVYN BORDER 5X5 (GAUZE/BANDAGES/DRESSINGS) ×3 IMPLANT
DRESSING ALLEVYN BORDER HEEL (GAUZE/BANDAGES/DRESSINGS) ×3 IMPLANT
ELECT REM PT RETURN 9FT ADLT (ELECTROSURGICAL) ×3
ELECTRODE REM PT RTRN 9FT ADLT (ELECTROSURGICAL) ×2 IMPLANT
FORMALIN 10 PREFIL 120ML (MISCELLANEOUS) ×6 IMPLANT
GLOVE BIOGEL PI IND STRL 7.0 (GLOVE) ×6 IMPLANT
GLOVE BIOGEL PI INDICATOR 7.0 (GLOVE) ×3
GLOVE EXAM NITRILE MD LF STRL (GLOVE) ×3 IMPLANT
GLOVE SKINSENSE NS SZ8.0 LF (GLOVE) ×1
GLOVE SKINSENSE STRL SZ8.0 LF (GLOVE) ×2 IMPLANT
GLOVE SS BIOGEL STRL SZ 6.5 (GLOVE) ×4 IMPLANT
GLOVE SS N UNI LF 8.5 STRL (GLOVE) ×3 IMPLANT
GLOVE SUPERSENSE BIOGEL SZ 6.5 (GLOVE) ×2
GOWN STRL REIN XL XLG (GOWN DISPOSABLE) ×9 IMPLANT
INST SET MINOR BONE (KITS) ×3 IMPLANT
KIT ROOM TURNOVER APOR (KITS) ×3 IMPLANT
KIT SURGICAL DEVON (SET/KITS/TRAYS/PACK) ×3 IMPLANT
MANIFOLD NEPTUNE II (INSTRUMENTS) ×3 IMPLANT
MARKER SKIN DUAL TIP RULER LAB (MISCELLANEOUS) ×3 IMPLANT
NEEDLE HYPO 21X1.5 SAFETY (NEEDLE) ×3 IMPLANT
NEEDLE MAYO 6 CRC TAPER PT (NEEDLE) ×3 IMPLANT
NS IRRIG 1000ML POUR BTL (IV SOLUTION) ×3 IMPLANT
PACK BASIC III (CUSTOM PROCEDURE TRAY) ×1
PACK SRG BSC III STRL LF ECLPS (CUSTOM PROCEDURE TRAY) ×2 IMPLANT
PAD ARMBOARD 7.5X6 YLW CONV (MISCELLANEOUS) ×3 IMPLANT
PASSER SUT CAPTURE FIRST (SUTURE) IMPLANT
PENCIL HANDSWITCHING (ELECTRODE) ×3 IMPLANT
PUSHLOCK PEEK 4.5X24 (Orthopedic Implant) ×6 IMPLANT
RASP LG TEAR CROSS CUT (RASP) IMPLANT
RASP SM TEAR CROSS CUT (RASP) ×3 IMPLANT
SET BASIN LINEN APH (SET/KITS/TRAYS/PACK) ×3 IMPLANT
SPONGE LAP 18X18 X RAY DECT (DISPOSABLE) ×6 IMPLANT
STAPLER VISISTAT 35W (STAPLE) IMPLANT
STOCKINETTE IMPERVIOUS LG (DRAPES) ×3 IMPLANT
STRIP CLOSURE SKIN 1/2X4 (GAUZE/BANDAGES/DRESSINGS) ×3 IMPLANT
SUT BONE WAX W31G (SUTURE) IMPLANT
SUT BRALON NAB BRD #1 30IN (SUTURE) IMPLANT
SUT ETHIBOND 5 LR DA (SUTURE) IMPLANT
SUT ETHIBOND NAB OS 4 #2 30IN (SUTURE) IMPLANT
SUT MON AB 0 CT1 (SUTURE) ×6 IMPLANT
SUT MON AB 2-0 CT1 36 (SUTURE) ×3 IMPLANT
SUT PERFECTPASSER WHITE CART (SUTURE) ×3 IMPLANT
SUT PROLENE 3 0 PS 1 (SUTURE) IMPLANT
SUT SMART STITCH CARTRIDGE (SUTURE) ×3 IMPLANT
SUT VIC AB 1 CT1 27 (SUTURE) ×1
SUT VIC AB 1 CT1 27XBRD ANTBC (SUTURE) ×2 IMPLANT
SYR 30ML LL (SYRINGE) ×3 IMPLANT
SYR BULB IRRIGATION 50ML (SYRINGE) ×6 IMPLANT
YANKAUER SUCT 12FT TUBE ARGYLE (SUCTIONS) ×3 IMPLANT

## 2012-05-07 NOTE — Preoperative (Signed)
Beta Blockers   Reason not to administer Beta Blockers:Not Applicable 

## 2012-05-07 NOTE — Brief Op Note (Signed)
05/07/2012  10:00 AM  PATIENT:  [name] [name]  56 y.o. female  PRE-OPERATIVE DIAGNOSIS:  right rotator cuff tear, ac joint arthrosis   POST-OPERATIVE DIAGNOSIS:  right rotator cuff tear, same   Operative findings: Large rotator cuff tear involving supraspinatus and infraspinatus tendon with significant and severe tendon degeneration. Large thickened subacromial bursitis. A.c. joint arthrosis.  PROCEDURE:  #1 Open rotator cuff repair right shoulder procedure, #2 open distal clavicle excision  Details of procedure: The patient was identified in the preop area, the right shoulder surgical site was confirmed. There was a cat scratch just off the distal clavicle. The patient was counseled on the risks of infection regarding this and wanted to proceed with surgery.  After chart update and chart review the patient was taken back to the operating room. She got weight appropriate dose of vancomycin secondary to penicillin allergies.  After intubation she was placed in the semi-modified beachchair position with a sandbag under the right shoulder. She was then prepped and draped. After the timeout was completed the surgery was done in the following manner:  #1 incision. 30 cc of Marcaine was injected into the subcutaneous tissue to a help with bleeding and also in the subacromial area. A straight incision was made starting at the a.c. joint and continued distally 3 cm distal to the anterolateral acromion. Simultaneous tissue was divided down to the fascial layer.  #2 deep dissection. The deltoid and deltotrapezial fascia was split in line with the skin incision and 2 medial lateral flaps were created in subperiosteal fashion. The distal clavicle was then excised. An anterior acromioplasty was then performed. A bursectomy was performed.  #3 repair. 3 sutures were placed in the large rotator cuff tear in the rotator cuff tendon edges were debridement. There was delamination of the rotator cuff.  Superficial and deep dissection was done and blunt fashion to release the rotator cuff and it was pulled towards the tuberosity. However, excursion was only possible to the articular margin. At that point I decided to medialize the tendon 5 mm. 2 ArthroCare suture anchors were placed per manufacture technique and the sutures were passed passing a total of 8 sutures. The sutures were saved after they were tied down. The sutures were checked for approximation of the tendon to bone, and this was satisfactory with excellent apposition. A drill holes were made in the tuberosity and the tuberosity was contoured to allow the sutures to have a smooth bed.  2 push lock anchors were placed in the sutures were crisscrossed to do a double repair.  Suture tension was checked and found to be excellent.  #4 wound closure. The wound was irrigated with copious amounts of saline. The deltotrapezial fascia was closed with running 0 Monocryl suture. 30 cc of Marcaine with epinephrine was injected in the sub-deltoid layer. Subcutaneous tissue was closed with 0 Monocryl and 2-0 Monocryl suture. Steri-Strips were applied. The dressing was applied and a shoulder immobilizer was applied and the patient was extubated and taken to recovery room in stable condition. We anticipate observation secondary to patient's multiple medical problems.  SURGEON:  Surgeon(s) and Role:    * [name], MD - Primary  PHYSICIAN ASSISTANT:   ASSISTANTS: Corrie Dandy   ANESTHESIA:   general  EBL:  Total I/O In: 1500 [I.V.:1500] Out: 50 [Blood:50]  BLOOD ADMINISTERED:none  DRAINS: none   LOCAL MEDICATIONS USED:  MARCAINE   , Amount: 60 ml and OTHER epi  SPECIMEN:  Source of Specimen:  Specimen #1 distal clavicle, specimen #2 distal acromion  DISPOSITION OF SPECIMEN:  PATHOLOGY  COUNTS:  YES  TOURNIQUET:  * No tourniquets in log *  DICTATION: .Dragon Dictation  PLAN OF CARE: Admit for overnight observation  PATIENT  DISPOSITION:  PACU - hemodynamically stable.   Delay start of Pharmacological VTE agent (>24hrs) due to surgical blood loss or risk of bleeding: yes

## 2012-05-07 NOTE — Transfer of Care (Signed)
  Anesthesia Post-op Note  Patient: [name] [name]  Procedure(s) Performed: Procedure(s) (LRB) with comments: RESECTION DISTAL CLAVICAL (Right) ROTATOR CUFF REPAIR SHOULDER OPEN (Right)  Patient Location: PACU  Anesthesia Type: General  Level of Consciousness: awake, alert , oriented and patient cooperative  Airway and Oxygen Therapy: Patient Spontanous Breathing and Patient connected to face mask oxygen  Post-op Pain: mild  Post-op Assessment: Post-op Vital signs reviewed, Patient's Cardiovascular Status Stable, Respiratory Function Stable, Patent Airway and No signs of Nausea or vomiting  Post-op Vital Signs: Reviewed and stable  Complications: No apparent anesthesia complications

## 2012-05-07 NOTE — Anesthesia Postprocedure Evaluation (Signed)
  Anesthesia Post-op Note  Patient: [name] [name]  Procedure(s) Performed: Procedure(s) (LRB) with comments: RESECTION DISTAL CLAVICAL (Right) ROTATOR CUFF REPAIR SHOULDER OPEN (Right)  Patient Location: PACU  Anesthesia Type: General  Level of Consciousness: awake, alert , oriented and patient cooperative  Airway and Oxygen Therapy: Patient Spontanous Breathing and Patient connected to face mask oxygen  Post-op Pain: mild  Post-op Assessment: Post-op Vital signs reviewed, Patient's Cardiovascular Status Stable, Respiratory Function Stable, Patent Airway and No signs of Nausea or vomiting  Post-op Vital Signs: Reviewed and stable  Complications: No apparent anesthesia complications

## 2012-05-07 NOTE — Anesthesia Preprocedure Evaluation (Signed)
Anesthesia Evaluation  Patient identified by MRN, date of birth, ID band  Reviewed: Allergy & Precautions, H&P , NPO status , Patient's Chart, lab work & pertinent test results  Airway Mallampati: I TM Distance: >3 FB     Dental  (+) Teeth Intact   Pulmonary asthma , COPD breath sounds clear to auscultation        Cardiovascular hypertension, Pt. on medications Rhythm:Regular Rate:Normal     Neuro/Psych PSYCHIATRIC DISORDERS Depression    GI/Hepatic GERD-  Medicated,  Endo/Other  diabetes, Well Controlled, Type 2, Insulin DependentHypothyroidism   Renal/GU      Musculoskeletal   Abdominal   Peds  Hematology   Anesthesia Other Findings   Reproductive/Obstetrics                           Anesthesia Physical Anesthesia Plan  ASA: III  Anesthesia Plan: General   Post-op Pain Management:    Induction: Intravenous, Rapid sequence and Cricoid pressure planned  Airway Management Planned: Oral ETT  Additional Equipment:   Intra-op Plan:   Post-operative Plan: Extubation in OR  Informed Consent: I have reviewed the patients History and Physical, chart, labs and discussed the procedure including the risks, benefits and alternatives for the proposed anesthesia with the patient or authorized representative who has indicated his/her understanding and acceptance.     Plan Discussed with:   Anesthesia Plan Comments:         Anesthesia Quick Evaluation

## 2012-05-07 NOTE — Anesthesia Procedure Notes (Signed)
Procedure Name: Intubation Date/Time: 05/07/2012 7:50 AM Performed by: Antony Contras, AMY L Pre-anesthesia Checklist: Patient identified, Patient being monitored, Timeout performed, Emergency Drugs available and Suction available Patient Re-evaluated:Patient Re-evaluated prior to inductionOxygen Delivery Method: Circle System Utilized Preoxygenation: Pre-oxygenation with 100% oxygen Intubation Type: IV induction, Rapid sequence and Cricoid Pressure applied Laryngoscope Size: 3 and Miller Grade View: Grade I Tube type: Oral Tube size: 7.0 mm Number of attempts: 1 Airway Equipment and Method: stylet Placement Confirmation: ETT inserted through vocal cords under direct vision,  positive ETCO2 and breath sounds checked- equal and bilateral Secured at: 21 cm Tube secured with: Tape Dental Injury: Teeth and Oropharynx as per pre-operative assessment

## 2012-05-07 NOTE — Interval H&P Note (Signed)
History and Physical Interval Note:  05/07/2012 7:29 AM  [name] [name]  has presented today for surgery, with the diagnosis of right rotator cuff tear  The various methods of treatment have been discussed with the patient and family. After consideration of risks, benefits and other options for treatment, the patient has consented to  Procedure(s) (LRB) with comments: RESECTION DISTAL CLAVICAL (Right) SHOLDER ARTHROSCOPY WITH OPEN ROTATOR CUFF REPAIR (Right) as a surgical intervention .  The patient's history has been reviewed, patient examined, she has a cat scratch near the planned surgical incision. She has been made aware of the potential for infection (prior to sedation, with increased risk but wishes to proceed with surgery stable for surgery.  I have reviewed the patient's chart and labs.  Questions were answered to the patient's satisfaction.     Arther Abbott

## 2012-05-07 NOTE — Op Note (Addendum)
05/07/2012  10:00 AM  PATIENT:  [name] [name]  56 y.o. female  PRE-OPERATIVE DIAGNOSIS:  right rotator cuff tear, ac joint arthrosis   POST-OPERATIVE DIAGNOSIS:  right rotator cuff tear, same   Operative findings: Large rotator cuff tear involving supraspinatus and infraspinatus tendon with significant and severe tendon degeneration. There was approximately 2 cm of retraction. Large thickened subacromial bursitis. A.c. joint arthrosis.  PROCEDURE:  #1 Open rotator cuff repair right shoulder procedure, #2 open distal clavicle excision  Details of procedure: The patient was identified in the preop area, the right shoulder surgical site was confirmed. There was a cat scratch just off the distal clavicle. The patient was counseled on the risks of infection regarding this and wanted to proceed with surgery.  After chart update and chart review the patient was taken back to the operating room. She got weight appropriate dose of vancomycin secondary to penicillin allergies.  After intubation she was placed in the semi-modified beachchair position with a sandbag under the right shoulder. She was then prepped and draped. After the timeout was completed the surgery was done in the following manner:  #1 incision. 30 cc of Marcaine was injected into the subcutaneous tissue to a help with bleeding and also in the subacromial area. A straight incision was made starting at the a.c. joint and continued distally 3 cm distal to the anterolateral acromion. Simultaneous tissue was divided down to the fascial layer.  #2 deep dissection. The deltoid and deltotrapezial fascia was split in line with the skin incision and 2 medial lateral flaps were created in subperiosteal fashion. The distal clavicle was then excised. An anterior acromioplasty was then performed. A bursectomy was performed.  #3 repair. 3 sutures were placed in the large rotator cuff tear in the rotator cuff tendon edges were debridement. There  was delamination of the rotator cuff. Superficial and deep dissection was done and blunt fashion to release the rotator cuff and it was pulled towards the tuberosity. However, excursion was only possible to the articular margin. At that point I decided to medialize the tendon 5 mm. 2 ArthroCare suture anchors were placed per manufacture technique and the sutures were passed passing a total of 8 sutures. The sutures were saved after they were tied down. The sutures were checked for approximation of the tendon to bone, and this was satisfactory with excellent apposition. A drill holes were made in the tuberosity and the tuberosity was contoured to allow the sutures to have a smooth bed.  2 push lock anchors were placed in the sutures were crisscrossed to do a double repair.  Suture tension was checked and found to be excellent.  #4 wound closure. The wound was irrigated with copious amounts of saline. The deltotrapezial fascia was closed with running 0 Monocryl suture. 30 cc of Marcaine with epinephrine was injected in the sub-deltoid layer. Subcutaneous tissue was closed with 0 Monocryl and 2-0 Monocryl suture. Steri-Strips were applied. The dressing was applied and a shoulder immobilizer was applied and the patient was extubated and taken to recovery room in stable condition. We anticipate observation secondary to patient's multiple medical problems.  SURGEON:  Surgeon(s) and Role:    * [name], MD - Primary  PHYSICIAN ASSISTANT:   ASSISTANTS: Corrie Dandy   ANESTHESIA:   general  EBL:  Total I/O In: 1500 [I.V.:1500] Out: 50 [Blood:50]  BLOOD ADMINISTERED:none  DRAINS: none   LOCAL MEDICATIONS USED:  MARCAINE   , Amount: 60 ml and OTHER epi  SPECIMEN:  Source of Specimen:  Specimen #1 distal clavicle, specimen #2 distal acromion  DISPOSITION OF SPECIMEN:  PATHOLOGY  COUNTS:  YES  TOURNIQUET:  * No tourniquets in log *  DICTATION: .Dragon Dictation  PLAN OF CARE: Admit  for overnight observation  PATIENT DISPOSITION:  PACU - hemodynamically stable.   Delay start of Pharmacological VTE agent (>24hrs) due to surgical blood loss or risk of bleeding: yes

## 2012-05-08 LAB — GLUCOSE, CAPILLARY: Glucose-Capillary: 163 mg/dL — ABNORMAL HIGH (ref 70–99)

## 2012-05-08 MED ORDER — ATORVASTATIN CALCIUM 10 MG PO TABS: 10.0000 mg | ORAL_TABLET | Freq: Every day | ORAL | Status: DC

## 2012-05-08 NOTE — Progress Notes (Signed)
UR Chart Review Completed  

## 2012-05-08 NOTE — Evaluation (Signed)
Occupational Therapy Evaluation Patient Details Name: [name] [name] MRN: [id number] DOB: 1955-07-23 Today's Date: 05/08/2012 Time: 8343-7357 OT Time Calculation (min): 19 min  OT Assessment / Plan / Recommendation Clinical Impression  Patient is a 56 y/o female s/p  right rotator cuff repair with all education complete. Patient was educated on donning/doffing right immobilizer, care, and wearing schedule. Patient was also educated on compensatory techniques during UB dressing, pain management techniques, positioning techniques, and  given A/ROM HEP for wrist and hand ranges to decrease stiffness. Recommend Outpatient OT at D/C.    OT Assessment  All further OT needs can be met in the next venue of care    Follow Up Recommendations  Outpatient OT       Equipment Recommendations  None recommended by OT          Precautions / Restrictions Precautions Precautions: Shoulder Type of Shoulder Precautions: No AROM. Shoulder immobilizer on at all times unless bathing and dressing. Required Braces or Orthoses: Other Brace/Splint Other Brace/Splint: right shoulder immobilizer   Pertinent Vitals/Pain 9/10 pain level in right shoulder. Pain meds have already been given.     Visit Information  Last OT Received On: 05/08/12 Assistance Needed: +1    Subjective Data  Subjective: "I'm in such fan." Patient Stated Goal: To go home.   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 No active or passive range for shoulder at this time. RUE Sensation: WFL - Light Touch;WFL - Proprioception RUE Coordination: WFL - gross/fine motor Left Upper Extremity Assessment LUE ROM/Strength/Tone: Within functional levels (MMT: 5/5) LUE Sensation: WFL - Light Touch;WFL - Proprioception LUE Coordination: WFL - gross/fine motor                 End of Session OT - End of Session Activity Tolerance: Patient tolerated treatment well Patient left: in bed;with call bell/phone within reach  GO Functional Assessment Tool Used: observation Functional Limitation: Self care Self Care Current Status (I9784): At least 20 percent but less than 40 percent impaired, limited or restricted Self Care Goal Status (R8412): At least 20 percent but less than 40 percent impaired, limited or restricted Self Care Discharge Status [phone]): At least 20 percent but less than 40 percent impaired, limited or restricted    Ailene Ravel, OTR/L 05/08/2012, 1:47 PM

## 2012-05-08 NOTE — Progress Notes (Addendum)
Subjective: 1 Day Post-Op Procedure(s) (LRB): RESECTION DISTAL CLAVICAL (Right) ROTATOR CUFF REPAIR SHOULDER OPEN (Right) Patient reports pain as 6 on 0-10 scale.   Nausea and vomited Patient took immobilizer off! I told her it has to stay on    Objective: Vital signs in last 24 hours: Temp:  [97.1 F (36.2 C)-98.6 F (37 C)] 97.6 F (36.4 C) (11/19 0505) Pulse Rate:  [84-96] 90  (11/19 0505) Resp:  [13-20] 20  (11/19 0505) BP: (102-165)/(45-78) 160/64 mmHg (11/19 0505) SpO2:  [91 %-100 %] 95 % (11/19 0730) Weight:  [308 lb (139.708 kg)] 308 lb (139.708 kg) (11/18 1455)  Intake/Output from previous day: 11/18 0701 - 11/19 0700 In: 3668.7 [P.O.:1210; I.V.:2256.7; IV Piggyback:202] Out: 50 [Blood:50] Intake/Output this shift:    No results found for this basename: HGB:5 in the last 72 hours No results found for this basename: WBC:2,RBC:2,HCT:2,PLT:2 in the last 72 hours No results found for this basename: NA:2,K:2,CL:2,CO2:2,BUN:2,CREATININE:2,GLUCOSE:2,CALCIUM:2 in the last 72 hours No results found for this basename: LABPT:2,INR:2 in the last 72 hours  Neurologically intact Neurovascular intact Sensation intact distally  Assessment/Plan: 1 Day Post-Op Procedure(s) (LRB): RESECTION DISTAL CLAVICAL (Right) ROTATOR CUFF REPAIR SHOULDER OPEN (Right) Up with therapy D/C IV fluids Plan for discharge tomorrow  Arther Abbott 05/08/2012, 7:41 AM

## 2012-05-08 NOTE — Anesthesia Postprocedure Evaluation (Signed)
  Anesthesia Post-op Note  Patient: [name] [name]  Procedure(s) Performed: Procedure(s) (LRB) with comments: RESECTION DISTAL CLAVICAL (Right) ROTATOR CUFF REPAIR SHOULDER OPEN (Right)  Patient Location:Room 311  Anesthesia Type: General  Level of Consciousness: awake, alert , oriented and patient cooperative  Airway and Oxygen Therapy: Patient Spontanous Breathing O2 via nasal cannula  Post-op Pain: mild  Post-op Assessment: Post-op Vital signs reviewed, Patient's Cardiovascular Status Stable, Respiratory Function Stable, Patent Airway and No signs of Nausea or vomiting  Post-op Vital Signs: Reviewed and stable  Complications: No apparent anesthesia complications

## 2012-05-08 NOTE — Addendum Note (Signed)
Addendum  created 05/08/12 0848 by Jule Economy, CRNA   Modules edited:Notes Section

## 2012-05-08 NOTE — Care Management Note (Signed)
    Page 1 of 2   05/09/2012     8:53:23 AM   CARE MANAGEMENT NOTE 05/09/2012  Patient:  [name] [name], [name] [name]   Account Number:  [id number]  Date Initiated:  05/08/2012  Documentation initiated by:  Theophilus Kinds  Subjective/Objective Assessment:   Pt admitted from home s/p right rotator cuff repair. Pt lives with her mother but has family that will be coming in to help the pt with her care at discharge. Pt was independent with ADL's prior to surgery.     Action/Plan:   Will continue to follow for possible HH needs.   Anticipated DC Date:  05/09/2012   Anticipated DC Plan:  [place]  CM consult      [facility] Choice  HOME HEALTH   Choice offered to / List presented to:  C-1 Patient        [place] arranged  [place].   Status of service:  Completed, signed off Medicare Important Message given?   (If response is "NO", the following Medicare IM given date fields will be blank) Date Medicare IM given:   Date Additional Medicare IM given:    Discharge Disposition:  [place]  Per UR Regulation:    If discussed at Long Length of Stay Meetings, dates discussed:    Comments:  05/09/12 [place], RN BSN CM Pt discharged home today with [facility] aide. Romualdo Bolk of [facility] is aware and will collect the pts information from the chart. No DME needs noted. Pt and pts nurse aware of discharge arrangements.  05/08/12 [place], RN BSN CM

## 2012-05-09 ENCOUNTER — Encounter (HOSPITAL_COMMUNITY): Payer: Self-pay | Admitting: Orthopedic Surgery

## 2012-05-09 LAB — GLUCOSE, CAPILLARY: Glucose-Capillary: 155 mg/dL — ABNORMAL HIGH (ref 70–99)

## 2012-05-09 MED ORDER — OXYCODONE-ACETAMINOPHEN 10-325 MG PO TABS: 1.0000 | ORAL_TABLET | ORAL | Status: DC | PRN

## 2012-05-09 MED ORDER — METHOCARBAMOL 500 MG PO TABS: 500.0000 mg | ORAL_TABLET | Freq: Four times a day (QID) | ORAL | Status: DC

## 2012-05-09 NOTE — Discharge Summary (Signed)
Physician Discharge Summary  Patient ID: [name] [name] MRN: [id number] DOB/AGE: 1956-01-19 56 y.o.  Admit date: 05/07/2012 Discharge date: 05/09/2012  Admission Diagnoses: right rotator cuff tear , arthritis ac joint   Discharge Diagnoses:same   Active Problems:  * No active hospital problems. *    Discharged Condition: good  Hospital Course: Was admitted to the hospital on November 18 for open right rotator cuff repair and open distal clavicle excision for massive rotator cuff tear and a.c. joint arthrosis. Although she tolerated the procedure well she was admitted to the hospital for pain control and lack of mobility secondary to morbid obesity. After 48 hours her pain was well-controlled with Percocet and methocarbomol. She was seen by occupational therapy which improved her mobility.   Consults: OT  Significant Diagnostic Studies: none  Treatments: therapies: OT and surgery: open right rotator cuff repair and distal clavicle excision   Discharge Exam: Blood pressure 125/52, pulse 97, temperature 98 F (36.7 C), temperature source Oral, resp. rate 18, height 5' 5"  (1.651 m), weight 308 lb (139.708 kg), SpO2 97.00%. General appearance: alert, cooperative and appears stated age Extremities: no edema, redness or tenderness in the calves or thighs Incision/Wound:clean   Disposition: 01-Home or Self Care  Discharge Orders    Future Orders Please Complete By Expires   Diet - low sodium heart healthy      Call MD / Call 911      Comments:   If you experience chest pain or shortness of breath, CALL 911 and be transported to the hospital emergency room.  If you develope a fever above 101 F, pus (white drainage) or increased drainage or redness at the wound, or calf pain, call your surgeon's office.   Constipation Prevention      Comments:   Drink plenty of fluids.  Prune juice may be helpful.  You may use a stool softener, such as Colace (over the counter) 100 mg twice a day.   Use MiraLax (over the counter) for constipation as needed.   Increase activity slowly as tolerated      Discharge instructions      Comments:   Follow therapy instruction ref sling   Get up and walk around the house at least 3 x a day   Change dressing as needed   Driving restrictions      Comments:   No driving for 6 weeks   Home Health      Scheduling Instructions:   adl assist (if available)   Questions: Responses:   To provide the following care/treatments [place]      Questions: Responses:   To provide the following care/treatments [place]   Face-to-face encounter      Comments:   I Arther Abbott certify that this patient is under my care and that I, or a nurse practitioner or physician's assistant working with me, had a face-to-face encounter that meets the physician face-to-face encounter requirements with this patient on 05/09/2012. The encounter with the patient was in whole, or in part for the following medical condition(s) which is the primary reason for home health care (List medical condition): rotator cuff repair, with morbid obesity   Questions: Responses:   The encounter with the patient was in whole, or in part, for the following medical condition, which is the primary reason for home health care right rotator cuff repair   I certify that, based on my findings, the following services are medically necessary  home health services Nursing   My clinical findings support the need for the above services Pain interferes with ambulation/mobility   Further, I certify that my clinical findings support that this patient is homebound due to: Pain interferes with ambulation/mobility   To provide the following care/treatments Home Health Aide       Medication List     As of 05/09/2012  7:35 AM    STOP taking these medications         traMADol 50 MG tablet   Commonly known as: ULTRAM      TAKE these medications         aspirin 81 MG EC  tablet   Take 81 mg by mouth daily.      buPROPion 150 MG 12 hr tablet   Commonly known as: WELLBUTRIN SR   Take 150 mg by mouth 2 (two) times daily.      CELEBREX 200 MG capsule   Generic drug: celecoxib   Take 200 mg by mouth daily.      colesevelam 625 MG tablet   Commonly known as: WELCHOL   Take 1,875 mg by mouth 2 (two) times daily with a meal. 3 tabs am 3 tabs pm      CYTOMEL 5 MCG tablet   Generic drug: liothyronine   Take 5 mcg by mouth daily.      diltiazem 240 MG 24 hr capsule   Commonly known as: CARDIZEM CD   Take 240 mg by mouth daily.      DULoxetine 60 MG capsule   Commonly known as: CYMBALTA   Take 120 mg by mouth daily.      furosemide 80 MG tablet   Commonly known as: LASIX   Take 80 mg by mouth daily.      LANTUS SOLOSTAR 100 UNIT/ML injection   Generic drug: insulin glargine   Inject 15 Units into the skin daily.      levothyroxine 300 MCG tablet   Commonly known as: SYNTHROID, LEVOTHROID   Take 300 mcg by mouth daily.      linagliptin 5 MG Tabs tablet   Commonly known as: TRADJENTA   Take 5 mg by mouth daily.      lisinopril 40 MG tablet   Commonly known as: PRINIVIL,ZESTRIL   Take 40 mg by mouth daily.      metFORMIN 1000 MG tablet   Commonly known as: GLUCOPHAGE   Take 1,000 mg by mouth 2 (two) times daily with a meal.      methocarbamol 500 MG tablet   Commonly known as: ROBAXIN   Take 1 tablet (500 mg total) by mouth every 6 (six) hours.      nystatin 100000 UNIT/GM Powd   Apply 100,000 g topically daily. Rash      oxyCODONE-acetaminophen 10-325 MG per tablet   Commonly known as: PERCOCET   Take 1 tablet by mouth every 4 (four) hours as needed for pain.      PROVENTIL HFA 108 (90 BASE) MCG/ACT inhaler   Generic drug: albuterol   Inhale 2 puffs into the lungs every 6 (six) hours as needed. Shortness of breath      simvastatin 20 MG tablet   Commonly known as: ZOCOR   Take 20 mg by mouth daily.           Follow-up  Information    Follow up with Arther Abbott, MD. On 05/14/2012. (wound check )    Contact information:   [address],  [address] [place] [address] [phone]         on Monday, November 25 the patient have a wound check in the office  Signed: Arther Abbott 05/09/2012, 7:35 AM

## 2012-05-09 NOTE — Progress Notes (Signed)
Subjective: 2 Days Post-Op Procedure(s) (LRB): RESECTION DISTAL CLAVICAL (Right) ROTATOR CUFF REPAIR SHOULDER OPEN (Right) Patient reports pain as moderate.  C/O itching   Objective: Vital signs in last 24 hours: Temp:  [97.9 F (36.6 C)-98.2 F (36.8 C)] 98 F (36.7 C) (11/20 0605) Pulse Rate:  [91-98] 97  (11/20 0605) Resp:  [18-22] 18  (11/20 0605) BP: (125-143)/(52-68) 125/52 mmHg (11/20 0605) SpO2:  [95 %-97 %] 97 % (11/20 0648)  Intake/Output from previous day: 11/19 0701 - 11/20 0700 In: 1080 [P.O.:1080] Out: 7510 [Urine:1725] Intake/Output this shift:    No results found for this basename: HGB:5 in the last 72 hours No results found for this basename: WBC:2,RBC:2,HCT:2,PLT:2 in the last 72 hours No results found for this basename: NA:2,K:2,CL:2,CO2:2,BUN:2,CREATININE:2,GLUCOSE:2,CALCIUM:2 in the last 72 hours No results found for this basename: LABPT:2,INR:2 in the last 72 hours  Neurologically intact Sensation intact distally Intact pulses distally  Assessment/Plan: 2 Days Post-Op Procedure(s) (LRB): RESECTION DISTAL CLAVICAL (Right) ROTATOR CUFF REPAIR SHOULDER OPEN (Right) Discharge home with home health  [name] [name] 05/09/2012, 7:26 AM

## 2012-05-09 NOTE — Progress Notes (Signed)
Discharge Summary: a/o.vss. Saline lock removed. Sling to right arm intact. Discharge instructions given. Prescriptions given. Pt verbalized understanding of instructions. Left floor via wheelchair with nursing staff and family members.

## 2012-05-14 ENCOUNTER — Encounter: Payer: Self-pay | Admitting: Orthopedic Surgery

## 2012-05-14 ENCOUNTER — Ambulatory Visit (INDEPENDENT_AMBULATORY_CARE_PROVIDER_SITE_OTHER): Payer: Medicare Other | Admitting: Orthopedic Surgery

## 2012-05-14 VITALS — Ht 65.0 in | Wt 308.0 lb

## 2012-05-14 DIAGNOSIS — Z9889 Other specified postprocedural states: Secondary | ICD-10-CM

## 2012-05-14 MED ORDER — HYDROCODONE-ACETAMINOPHEN 10-325 MG PO TABS: 1.0000 | ORAL_TABLET | Freq: Four times a day (QID) | ORAL | Status: DC | PRN

## 2012-05-14 NOTE — Progress Notes (Signed)
Patient ID: [name] [name], female   DOB: [birth year], 56 y.o.   MRN: [id number] Chief Complaint  Patient presents with  . Follow-up    Post op #1, right shoulder.   RIGHT rotator cuff repair, open technique. RIGHT shoulder distal clavicle excision as well.  Multi-suture anchor repair large cuff tear.  Patient is not tolerating regular sling-and-swathe placed in sling shot. Seemed to fit better. Wound looks clean.  Followup in 4 weeks START THERAPY AT 6 WEEKS POST OP

## 2012-05-14 NOTE — Progress Notes (Signed)
Patient ID: [name] [name], female   DOB: 08-Aug-1955, 56 y.o.   MRN: [id number] Chief Complaint  Patient presents with  . Follow-up    RT ROTATOR CUFF REPAIR     05/07/2012  PRE-OPERATIVE DIAGNOSIS: right rotator cuff tear, ac joint arthrosis  POST-OPERATIVE DIAGNOSIS: right rotator cuff tear, same  Operative findings: Large rotator cuff tear involving supraspinatus and infraspinatus tendon with significant and severe tendon degeneration. There was approximately 2 cm of retraction. Large thickened subacromial bursitis. A.c. joint arthrosis.  PROCEDURE: #1 Open rotator cuff repair right shoulder procedure, #2 open distal clavicle excision  Again her wound is clean. She is placed in a sling shot. She is allergic to Percocet. She is placed on hydrocodone 10.  Followup 4 weeks start therapy after that

## 2012-05-14 NOTE — Patient Instructions (Addendum)
Wear sling x 4 more weeks

## 2012-05-31 ENCOUNTER — Other Ambulatory Visit: Payer: Self-pay | Admitting: Adult Health

## 2012-05-31 ENCOUNTER — Other Ambulatory Visit: Payer: Self-pay | Admitting: *Deleted

## 2012-05-31 DIAGNOSIS — I10 Essential (primary) hypertension: Secondary | ICD-10-CM

## 2012-05-31 LAB — BASIC METABOLIC PANEL
CO2: 26 mEq/L (ref 19–32)
Calcium: 9 mg/dL (ref 8.4–10.5)
Creat: 0.66 mg/dL (ref 0.50–1.10)
Sodium: 137 mEq/L (ref 135–145)

## 2012-06-01 ENCOUNTER — Encounter: Payer: Self-pay | Admitting: *Deleted

## 2012-06-11 ENCOUNTER — Encounter: Payer: Self-pay | Admitting: Orthopedic Surgery

## 2012-06-11 ENCOUNTER — Ambulatory Visit (INDEPENDENT_AMBULATORY_CARE_PROVIDER_SITE_OTHER): Payer: Medicare Other | Admitting: Orthopedic Surgery

## 2012-06-11 ENCOUNTER — Ambulatory Visit (INDEPENDENT_AMBULATORY_CARE_PROVIDER_SITE_OTHER): Payer: Medicare Other

## 2012-06-11 VITALS — BP 140/78 | Ht 65.0 in | Wt 308.0 lb

## 2012-06-11 DIAGNOSIS — Z9889 Other specified postprocedural states: Secondary | ICD-10-CM

## 2012-06-11 DIAGNOSIS — M25519 Pain in unspecified shoulder: Secondary | ICD-10-CM

## 2012-06-11 NOTE — Progress Notes (Signed)
Patient ID: [name] [name], female   DOB: 19-Apr-1956, 56 y.o.   MRN: [id number] Chief Complaint  Patient presents with  . Follow-up    post op right rotator cuff repair    Surgery date was November 18 she had an open rotator cuff repair. The RIGHT shoulder with distal clavicle excision.  She fell out of bed 2 weeks ago, had some pain in the RIGHT shoulder.  She also complains of a suture and at the end of her wound.  The incision otherwise looks good. Part of the Vicryl stitches. Still palpable. She is advised to treat that with alcohol daily until it resolves.  Start therapy back in 6 weeks. She can wean herself from her sling and is okay for her to drive

## 2012-06-11 NOTE — Patient Instructions (Addendum)
[place] TO START THERAPY

## 2012-06-26 ENCOUNTER — Ambulatory Visit (HOSPITAL_COMMUNITY)
Admission: RE | Admit: 2012-06-26 | Discharge: 2012-06-26 | Disposition: A | Discharge status: Home / Self Care | Payer: Medicare Other | Source: Ambulatory Visit | Attending: Orthopedic Surgery | Admitting: Orthopedic Surgery

## 2012-06-26 DIAGNOSIS — M6281 Muscle weakness (generalized): Secondary | ICD-10-CM | POA: Insufficient documentation

## 2012-06-26 DIAGNOSIS — IMO0001 Reserved for inherently not codable concepts without codable children: Secondary | ICD-10-CM | POA: Insufficient documentation

## 2012-06-26 DIAGNOSIS — M75120 Complete rotator cuff tear or rupture of unspecified shoulder, not specified as traumatic: Secondary | ICD-10-CM

## 2012-06-26 DIAGNOSIS — E119 Type 2 diabetes mellitus without complications: Secondary | ICD-10-CM | POA: Insufficient documentation

## 2012-06-26 DIAGNOSIS — M25519 Pain in unspecified shoulder: Secondary | ICD-10-CM | POA: Insufficient documentation

## 2012-06-26 NOTE — Evaluation (Signed)
Occupational Therapy Evaluation  Patient Details  Name: [name] [name] MRN: [id number] Date of Birth: 03-Aug-1955  Today's Date: 06/26/2012 Time: 8242-3536 OT Time Calculation (min): 40 min OT Evaluation 840-900 20' Manual Therapy 900-915 15' Visit#: 1  of 24   Re-eval: 07/24/12   Assessment Diagnosis: S/P Right Rotator Cuff Repair Surgical Date: 05/07/12 Next MD Visit: 07/23/12 Prior Therapy: n/a  Authorization: BCBS Medicare  Authorization Time Period: before 10th visit  Authorization Visit#: 1  of 10    Past Medical History:  Past Medical History  Diagnosis Date  . Type 2 diabetes mellitus   . Essential hypertension, benign   . GERD (gastroesophageal reflux disease)   . Edema     feet and legs  . Morbid obesity   . COPD (chronic obstructive pulmonary disease)   . Thyroid disease   . Asthma   . Arthritis   . Depression   . Cirrhosis of liver   . Hypothyroidism 2009    thyroid cancer  . Cancer     thyroid   Past Surgical History:  Past Surgical History  Procedure Date  . Thyroidectomy 2009  . Cholecystectomy 1996  . Tonsillectomy 1958  . Foot surgery 2000    bone spur removed  . Abdominal hysterectomy 1985  . Exploratory laparotomy 2003  . Umbilical hernia repair 1443  . Debridement of abdominal wound 2011  . Cesarean section 1977  . Cardiac catheterization 2012  . Resection distal clavical 05/07/2012    Procedure: RESECTION DISTAL CLAVICAL;  Surgeon: [name], MD;  Location: [facility];  Service: Orthopedics;  Laterality: Right;  . Shoulder open rotator cuff repair 05/07/2012    Procedure: ROTATOR CUFF REPAIR SHOULDER OPEN;  Surgeon: [name], MD;  Location: [facility];  Service: Orthopedics;  Laterality: Right;    Subjective  Symptoms: S: I dont know how I injured it, it just got worse over time. Pertinent History: Ms. Group had surgery on 04/1812 to repair her right rotator cuff.  She also had a distal clavicle excision performed.   She is no longer wearing her sling.  She has been referred to occupational therapy for evaluation and treatment, following Dr. Ruthe Mannan rotator cuff repair protocol. Limitations: Dr. Althia Forts Protocol:  1/7-1/28 PROM with flexion limited to 100 and external rotation limited to 30.  1/28-2/11 PROM with no limitations.  2/11 begin AAROM when full PROM is achieved and begin AROM as tolerated.  See scanned protocol for full details.   Special Tests: UEFI:  scored 25/72 = 34% I level Patient Stated Goals: I want to get where I can use it again.  Pain Assessment Currently in Pain?: Yes Pain Score:   7 Pain Location: Shoulder Pain Orientation: Right;Lateral;Anterior;Posterior Pain Type: Acute pain  Precautions/Restrictions  Precautions Precautions: Shoulder Dr. Althia Forts Protocol:  1/7-1/28 PROM with flexion limited to 100 and external rotation limited to 30.  1/28-2/11 PROM with no limitations.  2/11 begin AAROM when full PROM is achieved and begin AROM as tolerated. 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 is not using her right arm for any activities above waist height.  She is able to complete her BADLs, but is not using her RUE  as dominant. Dominant Hand: Right  Cognition/Observation Cognition Overall Cognitive Status: Appears within functional limits for tasks assessed Observation/Other Assessments Observations: surgical scar is on anterior shoulder 9 cm in length, healed except for distal point which is scabbed   Sensation/Coordination/Edema Sensation Light Touch: Appears Intact Coordination Gross Motor  Movements are Fluid and Coordinated: Yes Fine Motor Movements are Fluid and Coordinated: Yes  Additional Assessments RUE PROM (degrees) RUE Overall PROM Comments: assessed in supine, ER/IR with shoulder adducted Right Shoulder Flexion: 60 Degrees Right Shoulder ABduction: 72 Degrees Right Shoulder Internal Rotation: 85 Degrees Right Shoulder External Rotation: 18 Degrees Palpation Palpation: moderate fascial restrictions in right shoulder region.     Exercise/Treatments    Manual Therapy Manual Therapy: Myofascial release Myofascial Release: MFR and scar release to right upper arm, anterior shoulder, posterior shoulder, and scapular region to decrease pain and restrictions and incrase pain free mobility in her RUE, within constraints of protocol.   Occupational Therapy Assessment and Plan OT Assessment and Plan Clinical Impression Statement: A:  57 year old female s/p right rotator cuff repair and distal clavicle excision on 05/07/12.  Patients deficts include increased pain and fascial/scar restrictions and decreased A/PROM and strength causing decreased I with B/IADLs, work, and leisure activities.   Pt will benefit from skilled therapeutic intervention in order to improve on the following deficits: Decreased range of motion;Decreased strength;Pain;Increased muscle spasms;Increased fascial restricitons Rehab Potential: Good OT Frequency: Min 2X/week OT Duration: 12 weeks OT Treatment/Interventions: Self-care/ADL training;Therapeutic exercise;Therapeutic activities;Manual therapy;Patient/family education OT Plan: P: Skilled OT intervention to decrease pain and restrictions and increase pain free mobility in her right shoulder region in order to return to prior level of function with all of her B/IADLs, work, and leisure activities.  Treatment Plan:  FOLLOW MD PROTOCOL.  PROM and MFR in supine.  supine isometrics, bridging.  Seated elev, ext, row.  elbow-hand AROM, ball stretches.      Goals Short Term Goals Time to Complete Short Term Goals: 6 weeks Short Term Goal 1: Patient will be educated on a HEP. Short Term Goal 2: Patient will increase PROM to [facility] in her right shoulder region for increased I with dressing and bathing activities.  Short Term Goal 3: Patient will increase right shoulder strength to 3+/5 for increased ability to lift pots and pans at work. Short Term Goal 4: Patient will decrease fascial restrictions to min-mod in her right shoulder region for increased ability to complete daily activities.  Short Term Goal 5: Patient will decrease pain level to 5/10 in her right shoulder when she is combing her hair. Long Term Goals Time to Complete Long Term Goals: 12 weeks Long Term Goal 1: Patient will return to prior level of independence with all B/IADLs, work, leisure activities and improve her score on the UEFI to 85% or better. Long Term Goal 2: Patient will increase right shoulder AROM to [facility] for increased ability to reach into overhead cabinets in her kitchen. Long Term Goal 3: Patient will increase right shoulder strength to 4+/5 or better for increased ability to lift pots and pans at work. Long Term Goal 4: Patient will decrease pain level to 2/10 in her right shoulder when combing her hair. Long Term Goal 5: Patient will decrease fascial restrictions to minimal in her right shoulder region for increased mobility needed for ADL completion.   Problem List Patient Active Problem List  Diagnosis  . DIAB W/UNSPEC COMP TYPE II/UNSPEC TYPE UNCNTRL  . OBESITY-MORBID (>100')  . ESOPHAGEAL  REFLUX  . EDEMA  . Chest pain  . Essential hypertension, benign  . Shoulder arthritis  . Rotator cuff rupture, complete  . Pain in joint, shoulder region  . Muscle weakness (generalized)    End of Session Activity Tolerance: Patient tolerated treatment well General Behavior During Session: [facility] for tasks performed Cognition: [facility] for tasks performed OT Plan of  Care OT Home Exercise Plan: Educated patient on HEP for pendulum exercises and towel slides.  Consulted and Agree with Plan of Care: Patient  GO Functional Assessment Tool Used: UEFI scored 25/72 = 34% I level, 66% impairment level Functional Limitation: Self care Self Care Current Status (T2458): At least 60 percent but less than 80 percent impaired, limited or restricted Self Care Goal Status (K9983): At least 1 percent but less than 20 percent impaired, limited or restricted  Vangie Bicker, OTR/L  06/26/2012, 9:45 AM  Physician Documentation Your signature is required to indicate approval of the treatment plan as stated above.  Please sign and either send electronically or make a copy of this report for your files and return this physician signed original.  Please mark one 1.__approve of plan  2. ___approve of plan with the following conditions.   ______________________________                                                          _____________________ Physician Signature                                                                                                             Date

## 2012-06-29 ENCOUNTER — Ambulatory Visit (HOSPITAL_COMMUNITY)
Admission: RE | Admit: 2012-06-29 | Discharge: 2012-06-29 | Disposition: A | Discharge status: Home / Self Care | Payer: Medicare Other | Source: Ambulatory Visit | Attending: Orthopedic Surgery | Admitting: Orthopedic Surgery

## 2012-06-29 DIAGNOSIS — M25519 Pain in unspecified shoulder: Secondary | ICD-10-CM

## 2012-06-29 DIAGNOSIS — M6281 Muscle weakness (generalized): Secondary | ICD-10-CM

## 2012-06-29 NOTE — Progress Notes (Signed)
Occupational Therapy Treatment Patient Details  Name: [name] [name] MRN: [id number] Date of Birth: 11-30-1955  Today's Date: 06/29/2012 Time: 1026-1050 OT Time Calculation (min): 24 min Manual Therapy [phone] 13' Therapeutic Exercises 1039-1050 11' Visit#: 2  of 24   Re-eval: 07/24/12    Authorization: BCBS Medicare  Authorization Time Period: before 10th visit  Authorization Visit#: 2  of 10   Subjective S:  didnt have too much trouble with the exercises. Limitations: Dr. Althia Forts Protocol: 1/7-1/28 PROM with flexion limited to 100 and external rotation limited to 30. 1/28-2/11 PROM with no limitations. 2/11 begin AAROM when full PROM is achieved and begin AROM as tolerated. See scanned protocol for full details. Pain Assessment Currently in Pain?: Yes Pain Score:   3 Pain Location: Shoulder Pain Orientation: Right Pain Type: Acute pain  Precautions/Restrictions   Dr. Althia Forts Protocol: 1/7-1/28 PROM with flexion limited to 100 and external rotation limited to 30. 1/28-2/11 PROM with no limitations. 2/11 begin AAROM when full PROM is achieved and begin AROM as tolerated. See scanned protocol for full details.  Exercise/Treatments Supine Protraction: PROM;10 reps Horizontal ABduction: PROM;10 reps External Rotation: PROM;10 reps Internal Rotation: PROM;10 reps Flexion: PROM;10 reps ABduction: PROM;10 reps Other Supine Exercises: bridging x 10  Seated Other Seated Exercises: elbow flexion, extension, supination, pronation, wrist flexion, extension x 10 reps Therapy Ball Flexion: 20 reps ABduction: 20 reps ROM / Strengthening / Isometric Strengthening   Flexion: 3X3";Supine Extension: Supine;3X3" External Rotation: Supine;3X3" Internal Rotation: Supine;3X3" ABduction: Supine;3X3" ADduction: Supine;3X3"    Manual Therapy Manual Therapy: Myofascial release Myofascial Release: MFR and scar release to right upper arm, anterior shoulder, posterior shoulder, and  scapular region to decrease pain and restrictions and incrase pain free mobility in her RUE, within constraints of protocol.  [phone]  Occupational Therapy Assessment and Plan OT Assessment and Plan Clinical Impression Statement: A:  Patient has full PROM within constraints of Dr. Ruthe Mannan protocol.  She had good vasomotor response with scar release this date.  VG required to slow rate of exercises. OT Plan: P:  Increase reps with bridging exercises and increase reps and contraction time with isometric strengthening in order to continue to strengthen the rotator cuff muscles in prep for AROM.   Goals Short Term Goals Time to Complete Short Term Goals: 6 weeks Short Term Goal 1: Patient will be educated on a HEP. Short Term Goal 1 Progress: Progressing toward goal Short Term Goal 2: Patient will increase PROM to [facility] in her right shoulder region for increased I with dressing and bathing activities.  Short Term Goal 2 Progress: Progressing toward goal Short Term Goal 3: Patient will increase right shoulder strength to 3+/5 for increased ability to lift pots and pans at work. Short Term Goal 3 Progress: Progressing toward goal Short Term Goal 4: Patient will decrease fascial restrictions to min-mod in her right shoulder region for increased ability to complete daily activities.  Short Term Goal 4 Progress: Progressing toward goal Short Term Goal 5: Patient will decrease pain level to 5/10 in her right shoulder when she is combing her hair. Short Term Goal 5 Progress: Progressing toward goal Long Term Goals Time to Complete Long Term Goals: 12 weeks Long Term Goal 1: Patient will return to prior level of independence with all B/IADLs, work, leisure activities and improve her score on the UEFI to 85% or better. Long Term Goal 1 Progress: Progressing toward goal Long Term Goal 2: Patient will increase right shoulder AROM to [facility] for increased  ability to reach into overhead cabinets in her  kitchen. Long Term Goal 2 Progress: Progressing toward goal Long Term Goal 3: Patient will increase right shoulder strength to 4+/5 or better for increased ability to lift pots and pans at work. Long Term Goal 3 Progress: Progressing toward goal Long Term Goal 4: Patient will decrease pain level to 2/10 in her right shoulder when combing her hair. Long Term Goal 4 Progress: Progressing toward goal Long Term Goal 5: Patient will decrease fascial restrictions to minimal in her right shoulder region for increased mobility needed for ADL completion.  Long Term Goal 5 Progress: Progressing toward goal  Problem List Patient Active Problem List  Diagnosis  . DIAB W/UNSPEC COMP TYPE II/UNSPEC TYPE UNCNTRL  . OBESITY-MORBID (>100')  . ESOPHAGEAL REFLUX  . EDEMA  . Chest pain  . Essential hypertension, benign  . Shoulder arthritis  . Rotator cuff rupture, complete  . Pain in joint, shoulder region  . Muscle weakness (generalized)    End of Session Activity Tolerance: Patient tolerated treatment well General Behavior During Session: [facility] for tasks performed Cognition: [facility] for tasks performed  [place], OTR/L  06/29/2012, 10:56 AM

## 2012-07-02 ENCOUNTER — Ambulatory Visit (HOSPITAL_COMMUNITY)
Admission: RE | Admit: 2012-07-02 | Discharge: 2012-07-02 | Disposition: A | Discharge status: Home / Self Care | Payer: Medicare Other | Source: Ambulatory Visit | Attending: Orthopedic Surgery | Admitting: Orthopedic Surgery

## 2012-07-02 DIAGNOSIS — M25519 Pain in unspecified shoulder: Secondary | ICD-10-CM

## 2012-07-02 DIAGNOSIS — M6281 Muscle weakness (generalized): Secondary | ICD-10-CM

## 2012-07-02 NOTE — Progress Notes (Signed)
Occupational Therapy Treatment Patient Details  Name: QUENTIN STREBEL MRN: [id number] Date of Birth: [birth year]  Today's Date: 07/02/2012 Time: 0109-3235 OT Time Calculation (min): 23 min Manual Therapy [phone] 13' Therapeutic Exercises [phone] 10' Visit#: 3  of 24   Re-eval: 07/24/12    Authorization: BCBS Medicare   Authorization Time Period: before 10th visit   Authorization Visit#: 3  of 10   Subjective Symptoms/Limitations Symptoms: S:  I woke up with it hurting pretty sharply. Limitations: Dr. Althia Forts Protocol: 1/7-1/28 PROM with flexion limited to 100 and external rotation limited to 30. 1/28-2/11 PROM with no limitations. 2/11 begin AAROM when full PROM is achieved and begin AROM as tolerated. See scanned protocol for full details.  Pain Assessment Currently in Pain?: Yes Pain Score:   8 Pain Orientation: Right Pain Type: Acute pain  Precautions/Restrictions   Dr. Althia Forts Protocol: 1/7-1/28 PROM with flexion limited to 100 and external rotation limited to 30. 1/28-2/11 PROM with no limitations. 2/11 begin AAROM when full PROM is achieved and begin AROM as tolerated. See scanned protocol for full details.    Exercise/Treatments Supine Protraction: PROM;10 reps Horizontal ABduction: PROM;10 reps External Rotation: PROM;10 reps Internal Rotation: PROM;10 reps Flexion: PROM;10 reps ABduction: PROM;10 reps Other Supine Exercises: bridging x 15 Seated Other Seated Exercises: elbow flexion, extension, supination, pronation, wrist flexion, extension x 15 reps Therapy Ball Flexion: 20 reps ABduction: 20 reps Isometric Strengthening   Flexion: Supine;3X5" Extension: Supine;3X5" External Rotation: 3X5";Supine Internal Rotation: Supine;3X5" ABduction: Supine;3X5" ADduction: Supine;3X5"    Manual Therapy Manual Therapy: Myofascial release Myofascial Release: MFR and scar release to right upper arm, anterior shoulder, posterior shoulder, and scapular region to  decrease pain and restrictions and incrase pain free mobility in her RUE, within constraints of protocol. [phone]   Occupational Therapy Assessment and Plan OT Assessment and Plan Clinical Impression Statement: A:  Patient increased to 3" x 5 with isometric strengthening.   OT Plan: P:  increase PROM reps, reps with all exercises, add pdrot/ret//elev/dep, pulleys x 1' each.    Goals Short Term Goals Time to Complete Short Term Goals: 6 weeks Short Term Goal 1: Patient will be educated on a HEP. Short Term Goal 1 Progress: Progressing toward goal Short Term Goal 2: Patient will increase PROM to [facility] in her right shoulder region for increased I with dressing and bathing activities.  Short Term Goal 2 Progress: Progressing toward goal Short Term Goal 3: Patient will increase right shoulder strength to 3+/5 for increased ability to lift pots and pans at work. Short Term Goal 3 Progress: Progressing toward goal Short Term Goal 4: Patient will decrease fascial restrictions to min-mod in her right shoulder region for increased ability to complete daily activities.  Short Term Goal 4 Progress: Progressing toward goal Short Term Goal 5: Patient will decrease pain level to 5/10 in her right shoulder when she is combing her hair. Short Term Goal 5 Progress: Progressing toward goal Long Term Goals Time to Complete Long Term Goals: 12 weeks Long Term Goal 1: Patient will return to prior level of independence with all B/IADLs, work, leisure activities and improve her score on the UEFI to 85% or better. Long Term Goal 1 Progress: Progressing toward goal Long Term Goal 2: Patient will increase right shoulder AROM to [facility] for increased ability to reach into overhead cabinets in her kitchen. Long Term Goal 2 Progress: Progressing toward goal Long Term Goal 3: Patient will increase right shoulder strength to 4+/5 or better for increased  ability to lift pots and pans at work. Long Term Goal 3 Progress:  Progressing toward goal Long Term Goal 4: Patient will decrease pain level to 2/10 in her right shoulder when combing her hair. Long Term Goal 4 Progress: Progressing toward goal Long Term Goal 5: Patient will decrease fascial restrictions to minimal in her right shoulder region for increased mobility needed for ADL completion.  Long Term Goal 5 Progress: Progressing toward goal  Problem List Patient Active Problem List  Diagnosis  . DIAB W/UNSPEC COMP TYPE II/UNSPEC TYPE UNCNTRL  . OBESITY-MORBID (>100')  . ESOPHAGEAL REFLUX  . EDEMA  . Chest pain  . Essential hypertension, benign  . Shoulder arthritis  . Rotator cuff rupture, complete  . Pain in joint, shoulder region  . Muscle weakness (generalized)    End of Session Activity Tolerance: Patient tolerated treatment well General Behavior During Session: [facility] for tasks performed Cognition: [facility] for tasks performed  [place], OTR/L  07/02/2012, 10:11 AM

## 2012-07-06 ENCOUNTER — Ambulatory Visit (HOSPITAL_COMMUNITY)
Admission: RE | Admit: 2012-07-06 | Discharge: 2012-07-06 | Disposition: A | Discharge status: Home / Self Care | Payer: Medicare Other | Source: Ambulatory Visit | Attending: Orthopedic Surgery | Admitting: Orthopedic Surgery

## 2012-07-06 NOTE — Progress Notes (Signed)
Occupational Therapy Treatment Patient Details  Name: TAMEIKA HECKMANN MRN: [id number] Date of Birth: 04-Oct-1955  Today's Date: 07/06/2012 Time: 6333-5456 OT Time Calculation (min): 30 min Manual Therapy [phone] 15' Therapeutic Exercises [phone] 15' Visit#: 4  of 24   Re-eval: 07/24/12    Authorization: BCBS Medicare   Authorization Time Period: before 10th visit   Authorization Visit#: 4  of 10   Subjective S:  Ive been helping mom with her exercises. Limitations: Dr. Althia Forts Protocol: 1/7-1/28 PROM with flexion limited to 100 and external rotation limited to 30. 1/28-2/11 PROM with no limitations. 2/11 begin AAROM when full PROM is achieved and begin AROM as tolerated. See scanned protocol for full details. Pain Assessment Currently in Pain?: Yes Pain Score:   5 Pain Location: Shoulder Pain Orientation: Right Pain Type: Acute pain  Precautions/Restrictions    Dr. Althia Forts Protocol: 1/7-1/28 PROM with flexion limited to 100 and external rotation limited to 30. 1/28-2/11 PROM with no limitations. 2/11 begin AAROM when full PROM is achieved and begin AROM as tolerated. See scanned protocol for full details.   Exercise/Treatments Supine Protraction: PROM;10 reps Horizontal ABduction: PROM;10 reps External Rotation: PROM;10 reps Internal Rotation: PROM;10 reps Flexion: PROM;10 reps ABduction: PROM;10 reps Other Supine Exercises: bridging x 20 Seated Other Seated Exercises: elbow flexion, extension, supination, pronation, wrist flexion, extension x 15 reps with 1# Therapy Ball Flexion: 25 reps ABduction: 25 reps ROM / Strengthening / Isometric Strengthening Prot/Ret//Elev/Dep: 1' Flexion: Supine;5X5" Extension: Supine;5X5" External Rotation: Supine;5X5" Internal Rotation: Supine;5X5" ABduction: Supine;5X5" ADduction: Supine;5X5"    Manual Therapy Manual Therapy: Myofascial release Myofascial Release: MFR and scar release to right upper arm, anterior shoulder,  posterior shoulder, and scapular region to decrease pain and restrictions and incrase pain free mobility in her RUE, within constraints of protocol. [phone]  Occupational Therapy Assessment and Plan OT Assessment and Plan Clinical Impression Statement: A:  Added 1# resistance to elbow-wrist strengthening and added prot/ret//elev/dep exercise for increased scapular stability.  Crepitis noted with PROM into flexion.  No residual pain during PROM or after. OT Plan: P:  Add pulleys 1' each, increase I with prot/ret//elev/dep.  Transition to isometrics in standing with theraband.   Goals Short Term Goals Time to Complete Short Term Goals: 6 weeks Short Term Goal 1: Patient will be educated on a HEP. Short Term Goal 2: Patient will increase PROM to [facility] in her right shoulder region for increased I with dressing and bathing activities.  Short Term Goal 3: Patient will increase right shoulder strength to 3+/5 for increased ability to lift pots and pans at work. Short Term Goal 4: Patient will decrease fascial restrictions to min-mod in her right shoulder region for increased ability to complete daily activities.  Short Term Goal 5: Patient will decrease pain level to 5/10 in her right shoulder when she is combing her hair. Long Term Goals Time to Complete Long Term Goals: 12 weeks Long Term Goal 1: Patient will return to prior level of independence with all B/IADLs, work, leisure activities and improve her score on the UEFI to 85% or better. Long Term Goal 2: Patient will increase right shoulder AROM to [facility] for increased ability to reach into overhead cabinets in her kitchen. Long Term Goal 3: Patient will increase right shoulder strength to 4+/5 or better for increased ability to lift pots and pans at work. Long Term Goal 4: Patient will decrease pain level to 2/10 in her right shoulder when combing her hair. Long Term Goal 5: Patient  will decrease fascial restrictions to minimal in her right shoulder  region for increased mobility needed for ADL completion.   Problem List Patient Active Problem List  Diagnosis  . DIAB W/UNSPEC COMP TYPE II/UNSPEC TYPE UNCNTRL  . OBESITY-MORBID (>100')  . ESOPHAGEAL REFLUX  . EDEMA  . Chest pain  . Essential hypertension, benign  . Shoulder arthritis  . Rotator cuff rupture, complete  . Pain in joint, shoulder region  . Muscle weakness (generalized)    End of Session Activity Tolerance: Patient tolerated treatment well General Behavior During Session: [facility] for tasks performed Cognition: [facility] for tasks performed  Vangie Bicker, OTR/L  07/06/2012, 10:06 AM

## 2012-07-11 ENCOUNTER — Ambulatory Visit (HOSPITAL_COMMUNITY)
Admission: RE | Admit: 2012-07-11 | Discharge: 2012-07-11 | Disposition: A | Discharge status: Home / Self Care | Payer: Medicare Other | Source: Ambulatory Visit | Attending: Orthopedic Surgery | Admitting: Orthopedic Surgery

## 2012-07-11 DIAGNOSIS — M25519 Pain in unspecified shoulder: Secondary | ICD-10-CM

## 2012-07-11 DIAGNOSIS — M6281 Muscle weakness (generalized): Secondary | ICD-10-CM

## 2012-07-11 NOTE — Progress Notes (Signed)
Occupational Therapy Treatment Patient Details  Name: [name] [name] MRN: [id number] Date of Birth: 07/13/1955  Today's Date: 07/11/2012 Time: 2703-5009 OT Time Calculation (min): 36 min Manual Therapy [phone] 15' Therapeutic Exercises [phone] 21' Visit#: 5  of 24   Re-eval: 07/24/12    Authorization: BCBS Medicare   Authorization Time Period: before 10th visit  Authorization Visit#: 5  of 24   Subjective Symptoms/Limitations Symptoms: S:  I had to lift Mom's wheelchair off her toe on Sunday.  My shoulder has hurt ever since.  Limitations: Dr. Althia Forts Protocol: 1/7-1/28 PROM with flexion limited to 100 and external rotation limited to 30. 1/28-2/11 PROM with no limitations. 2/11 begin AAROM when full PROM is achieved and begin AROM as tolerated. See scanned protocol for full details.  Pain Assessment Currently in Pain?: Yes Pain Score:   4 Pain Location: Shoulder Pain Orientation: Right Pain Type: Acute pain  Precautions/Restrictions   Dr. Althia Forts Protocol: 1/7-1/28 PROM with flexion limited to 100 and external rotation limited to 30. 1/28-2/11 PROM with no limitations. 2/11 begin AAROM when full PROM is achieved and begin AROM as tolerated. See scanned protocol for full details.    Exercise/Treatments Supine Protraction: PROM;10 reps Horizontal ABduction: PROM;10 reps External Rotation: PROM;10 reps Internal Rotation: PROM;10 reps Flexion: PROM;10 reps ABduction: PROM;10 reps Other Supine Exercises: bridging x 20 Seated Other Seated Exercises: elbow flexion, extension, supination, pronation, wrist flexion, extension x 20 reps with 1# Pulleys Flexion: 1 minute ABduction: 1 minute Therapy Ball Flexion: 25 reps ABduction: 25 reps ROM / Strengthening / Isometric Strengthening Prot/Ret//Elev/Dep: 1'      Manual Therapy Manual Therapy: Myofascial release Myofascial Release: MFR and scar release to right upper arm, anterior shoulder, posterior shoulder, and  scapular region to decrease pain and restrictions and incrase pain free mobility in her RUE, within constraints of protocol. [phone]  Occupational Therapy Assessment and Plan OT Assessment and Plan Clinical Impression Statement: A:  Increased to 20 reps with elbow-wrist strengthening.  Transitioned to standing isometrics with tband for a greater challenge for patient.  Less crepitis with increased PROM in all shoulder ranges noted this date.  OT Plan: P:  Increase contraction and reps with standing shoulder isometrics with tband for increased shoulder strength in prep for AAROM when protocol allows.   Goals Short Term Goals Time to Complete Short Term Goals: 6 weeks Short Term Goal 1: Patient will be educated on a HEP. Short Term Goal 1 Progress: Progressing toward goal Short Term Goal 2: Patient will increase PROM to [facility] in her right shoulder region for increased I with dressing and bathing activities.  Short Term Goal 2 Progress: Progressing toward goal Short Term Goal 3: Patient will increase right shoulder strength to 3+/5 for increased ability to lift pots and pans at work. Short Term Goal 3 Progress: Progressing toward goal Short Term Goal 4: Patient will decrease fascial restrictions to min-mod in her right shoulder region for increased ability to complete daily activities.  Short Term Goal 4 Progress: Progressing toward goal Short Term Goal 5: Patient will decrease pain level to 5/10 in her right shoulder when she is combing her hair. Short Term Goal 5 Progress: Progressing toward goal Long Term Goals Time to Complete Long Term Goals: 12 weeks Long Term Goal 1: Patient will return to prior level of independence with all B/IADLs, work, leisure activities and improve her score on the UEFI to 85% or better. Long Term Goal 1 Progress: Progressing toward goal Long Term Goal 2:  Patient will increase right shoulder AROM to [facility] for increased ability to reach into overhead cabinets in her  kitchen. Long Term Goal 2 Progress: Progressing toward goal Long Term Goal 3: Patient will increase right shoulder strength to 4+/5 or better for increased ability to lift pots and pans at work. Long Term Goal 3 Progress: Progressing toward goal Long Term Goal 4: Patient will decrease pain level to 2/10 in her right shoulder when combing her hair. Long Term Goal 4 Progress: Progressing toward goal Long Term Goal 5: Patient will decrease fascial restrictions to minimal in her right shoulder region for increased mobility needed for ADL completion.  Long Term Goal 5 Progress: Progressing toward goal  Problem List Patient Active Problem List  Diagnosis  . DIAB W/UNSPEC COMP TYPE II/UNSPEC TYPE UNCNTRL  . OBESITY-MORBID (>100')  . ESOPHAGEAL REFLUX  . EDEMA  . Chest pain  . Essential hypertension, benign  . Shoulder arthritis  . Rotator cuff rupture, complete  . Pain in joint, shoulder region  . Muscle weakness (generalized)    End of Session Activity Tolerance: Patient tolerated treatment well General Behavior During Session: [facility] for tasks performed Cognition: [facility] for tasks performed  Vangie Bicker, OTR/L  07/11/2012, 10:14 AM

## 2012-07-13 ENCOUNTER — Ambulatory Visit (HOSPITAL_COMMUNITY)
Admission: RE | Admit: 2012-07-13 | Discharge: 2012-07-13 | Disposition: A | Discharge status: Home / Self Care | Payer: Medicare Other | Source: Ambulatory Visit | Attending: Orthopedic Surgery | Admitting: Orthopedic Surgery

## 2012-07-13 DIAGNOSIS — M25519 Pain in unspecified shoulder: Secondary | ICD-10-CM

## 2012-07-13 DIAGNOSIS — M6281 Muscle weakness (generalized): Secondary | ICD-10-CM

## 2012-07-13 NOTE — Progress Notes (Signed)
Occupational Therapy Treatment Patient Details  Name: DALEYSSA LOISELLE MRN: [id number] Date of Birth: 11-27-1955  Today's Date: 07/13/2012 Time: 2595-6387 OT Time Calculation (min): 33 min Manual Therapy [phone] 11'  Therapeutic Exercises [phone] 22' Visit#: 6  of 24   Re-eval: 07/24/12    Authorization: BCBS Medicare   Authorization Time Period: before 10th visit   Authorization Visit#: 6  of 10   Subjective Symptoms/Limitations Symptoms: S:  It felt pretty good after therapy yesterday.  Pain Assessment Currently in Pain?: Yes Pain Score:   3 Pain Location: Shoulder Pain Orientation: Right Pain Type: Acute pain  Precautions/Restrictions     Exercise/Treatments Supine Protraction: PROM;10 reps Horizontal ABduction: PROM;10 reps External Rotation: PROM;10 reps Internal Rotation: PROM;10 reps Flexion: PROM;10 reps ABduction: PROM;10 reps Other Supine Exercises: bridging x 25 Seated Other Seated Exercises: elbow flexion, extension, supination, pronation, wrist flexion, extension x 20 reps with 2# Pulleys Flexion: 2 minutes ABduction: 2 minutes Therapy Ball Flexion: 25 reps ABduction: 25 reps ROM / Strengthening / Isometric Strengthening Prot/Ret//Elev/Dep: resume next visit Flexion: Theraband;5X5" Theraband Level (Flexion): Level 2 (Red) Extension: Theraband;5X5" Theraband Level (Extension): Level 2 (Red) External Rotation: Theraband;5X5" Theraband Level (External Rotation): Level 2 (Red) Internal Rotation: Theraband;5X5" Theraband Level (Internal Rotation): Level 2 (Red) ABduction: Theraband;5X5" Theraband Level (ABduction): Level 2 (Red) ADduction: Theraband;5X5" Theraband Level (ADduction): Level 2 (Red) Stretches   [employer] Therapy Manual Therapy: Myofascial release Myofascial Release: MFR and scar release to right upper arm, anterior shoulder, posterior shoulder, and scapular region to decrease pain and  restrictions and incrase pain free mobility in her RUE, within constraints of protocol.  Occupational Therapy Assessment and Plan OT Assessment and Plan Clinical Impression Statement: A:  Increased to 2' with pulley PROM exercises.  Increased to 2# with elbow-wrist strengthening exercises.  PROM is WFL in supine, within parameters of protocol. OT Plan: P:  Beginning 07/17/12 no restrictions on PROM through 07/31/12.  Increase contraction time on isometric strengthening.    Goals Short Term Goals Time to Complete Short Term Goals: 6 weeks Short Term Goal 1: Patient will be educated on a HEP. Short Term Goal 2: Patient will increase PROM to [facility] in her right shoulder region for increased I with dressing and bathing activities.  Short Term Goal 3: Patient will increase right shoulder strength to 3+/5 for increased ability to lift pots and pans at work. Short Term Goal 4: Patient will decrease fascial restrictions to min-mod in her right shoulder region for increased ability to complete daily activities.  Short Term Goal 5: Patient will decrease pain level to 5/10 in her right shoulder when she is combing her hair. Long Term Goals Time to Complete Long Term Goals: 12 weeks Long Term Goal 1: Patient will return to prior level of independence with all B/IADLs, work, leisure activities and improve her score on the UEFI to 85% or better. Long Term Goal 2: Patient will increase right shoulder AROM to [facility] for increased ability to reach into overhead cabinets in her kitchen. Long Term Goal 3: Patient will increase right shoulder strength to 4+/5 or better for increased ability to lift pots and pans at work. Long Term Goal 4: Patient will decrease pain level to 2/10 in her right shoulder when combing her hair. Long Term Goal 5: Patient will decrease fascial restrictions to minimal in her right shoulder region for increased mobility needed for ADL  completion.   Problem List Patient Active Problem List    Diagnosis  . DIAB W/UNSPEC COMP TYPE II/UNSPEC TYPE UNCNTRL  . OBESITY-MORBID (>100')  . ESOPHAGEAL REFLUX  . EDEMA  . Chest pain  . Essential hypertension, benign  . Shoulder arthritis  . Rotator cuff rupture, complete  . Pain in joint, shoulder region  . Muscle weakness (generalized)    End of Session Activity Tolerance: Patient tolerated treatment well General Behavior During Session: [facility] for tasks performed Cognition: [facility] for tasks performed  GO    Arbutus Ped 07/13/2012, 10:14 AM

## 2012-07-17 ENCOUNTER — Ambulatory Visit (HOSPITAL_COMMUNITY)
Admission: RE | Admit: 2012-07-17 | Discharge: 2012-07-17 | Disposition: A | Discharge status: Home / Self Care | Payer: Medicare Other | Source: Ambulatory Visit | Attending: Orthopedic Surgery | Admitting: Orthopedic Surgery

## 2012-07-17 DIAGNOSIS — M6281 Muscle weakness (generalized): Secondary | ICD-10-CM

## 2012-07-17 DIAGNOSIS — M25519 Pain in unspecified shoulder: Secondary | ICD-10-CM

## 2012-07-17 NOTE — Progress Notes (Signed)
Occupational Therapy Treatment Patient Details  Name: [name] [name] MRN: [id number] Date of Birth: June 30, 1955  Today's Date: 07/17/2012 Time: 6333-5456 OT Time Calculation (min): 36 min Manual Therapy [phone] 13' Therapeutic Exercises [phone] 23' Visit#: 7  of 24   Re-eval: 07/24/12    Authorization: BCBS Medicare   Authorization Time Period: before 10th visit   Authorization Visit#: 7  of 10   Subjective Symptoms/Limitations Limitations: 1/28-2/11 PROM with no limitations. 2/11 begin AAROM when full PROM is achieved and begin AROM as tolerated. See scanned protocol for full details.   Precautions/Restrictions   1/28-2/11 PROM with no limitations. 2/11 begin AAROM when full PROM is achieved and begin AROM as tolerated. See scanned protocol for full details.    Exercise/Treatments Supine Protraction: PROM;10 reps Horizontal ABduction: PROM;10 reps External Rotation: PROM;10 reps Internal Rotation: PROM;10 reps Flexion: PROM;10 reps ABduction: PROM;10 reps Other Supine Exercises: dc Seated Elevation: AROM;15 reps Extension: AROM;15 reps Row: AROM;15 reps Other Seated Exercises: elbow flexion, extension, supination, pronation, wrist flexion, extension x 20 reps with 2# Pulleys Flexion: 2 minutes ABduction: 2 minutes Therapy Ball Flexion: 25 reps ABduction: 25 reps ROM / Strengthening / Isometric Strengthening Thumb Tacks: 1' Prot/Ret//Elev/Dep: 1' Flexion: Theraband;5X5" Theraband Level (Flexion): Level 2 (Red) Extension: Theraband;5X5" Theraband Level (Extension): Level 2 (Red) External Rotation: Theraband;5X5" Theraband Level (External Rotation): Level 2 (Red) Internal Rotation: Theraband;5X5" Theraband Level (Internal Rotation): Level 2 (Red) ABduction: Theraband;5X5" Theraband Level (ABduction): Level 2 (Red) ADduction: Theraband;5X5" Theraband Level (ADduction): Level 2 (Red)       Occupational Therapy Assessment and Plan OT Assessment and  Plan Clinical Impression Statement: A:  Patient with a slight increase in pain and crepitis this date, question whether weather is a factor in these changes.   Rehab Potential: Good OT Plan: P:  Increase to 3' with flexion and abduction pulley exercises for increased PROM and sustained activity tolerance.     Goals Short Term Goals Time to Complete Short Term Goals: 6 weeks Short Term Goal 1: Patient will be educated on a HEP. Short Term Goal 1 Progress: Progressing toward goal Short Term Goal 2: Patient will increase PROM to [name] in her right shoulder region for increased I with dressing and bathing activities.  Short Term Goal 2 Progress: Progressing toward goal Short Term Goal 3: Patient will increase right shoulder strength to 3+/5 for increased ability to lift pots and pans at work. Short Term Goal 3 Progress: Progressing toward goal Short Term Goal 4: Patient will decrease fascial restrictions to min-mod in her right shoulder region for increased ability to complete daily activities.  Short Term Goal 4 Progress: Progressing toward goal Short Term Goal 5: Patient will decrease pain level to 5/10 in her right shoulder when she is combing her hair. Short Term Goal 5 Progress: Progressing toward goal Long Term Goals Time to Complete Long Term Goals: 12 weeks Long Term Goal 1: Patient will return to prior level of independence with all B/IADLs, work, leisure activities and improve her score on the UEFI to 85% or better. Long Term Goal 1 Progress: Progressing toward goal Long Term Goal 2: Patient will increase right shoulder AROM to [facility] for increased ability to reach into overhead cabinets in her kitchen. Long Term Goal 2 Progress: Progressing toward goal Long Term Goal 3: Patient will increase right shoulder strength to 4+/5 or better for increased ability to lift pots and pans at work. Long Term Goal 3 Progress: Progressing toward goal Long Term Goal 4: Patient will  decrease pain level to  2/10 in her right shoulder when combing her hair. Long Term Goal 4 Progress: Progressing toward goal Long Term Goal 5: Patient will decrease fascial restrictions to minimal in her right shoulder region for increased mobility needed for ADL completion.  Long Term Goal 5 Progress: Progressing toward goal  Problem List Patient Active Problem List  Diagnosis  . DIAB W/UNSPEC COMP TYPE II/UNSPEC TYPE UNCNTRL  . OBESITY-MORBID (>100')  . ESOPHAGEAL REFLUX  . EDEMA  . Chest pain  . Essential hypertension, benign  . Shoulder arthritis  . Rotator cuff rupture, complete  . Pain in joint, shoulder region  . Muscle weakness (generalized)    End of Session Activity Tolerance: Patient tolerated treatment well General Behavior During Session: [facility] for tasks performed Cognition: [facility] for tasks performed  [place], OTR/L  07/17/2012, 10:02 AM

## 2012-07-18 ENCOUNTER — Ambulatory Visit (HOSPITAL_COMMUNITY): Payer: Medicare Other | Admitting: Specialist

## 2012-07-20 ENCOUNTER — Ambulatory Visit (HOSPITAL_COMMUNITY)
Admission: RE | Admit: 2012-07-20 | Discharge: 2012-07-20 | Disposition: A | Discharge status: Home / Self Care | Payer: Medicare Other | Source: Ambulatory Visit | Attending: Orthopedic Surgery | Admitting: Orthopedic Surgery

## 2012-07-20 DIAGNOSIS — M25519 Pain in unspecified shoulder: Secondary | ICD-10-CM

## 2012-07-20 DIAGNOSIS — M6281 Muscle weakness (generalized): Secondary | ICD-10-CM

## 2012-07-20 NOTE — Progress Notes (Signed)
Occupational Therapy Treatment Patient Details  Name: [name] [name] MRN: [id number] Date of Birth: 10-21-1955  Today's Date: 07/20/2012 Time: 4008-6761 OT Time Calculation (min): 41 min Therapeutic Exercises [phone] 18' Manual Therapy [phone] 13' Reassessment 1005-1011 6' Visit#: 8  of 24   Re-eval: 08/17/12    Authorization: BCBS Medicare   Authorization Time Period: before 18th visit  Authorization Visit#: 8  of 18   Subjective Symptoms/Limitations Symptoms: S:  It feels better in general.   Limitations: 1/28-2/11 PROM with no limitations. 2/11 begin AAROM when full PROM is achieved and begin AROM as tolerated. See scanned protocol for full details.  Special Tests: UEFI is 33/80 = 41% Independence level Pain Assessment Currently in Pain?: No/denies Pain Score: 0-No pain  Precautions/Restrictions   1/28-2/11 PROM with no limitations. 2/11 begin AAROM when full PROM is achieved and begin AROM as tolerated. See scanned protocol for full details.    Exercise/Treatments Supine Protraction: PROM;10 reps Horizontal ABduction: PROM;10 reps External Rotation: PROM;10 reps Internal Rotation: PROM;10 reps Flexion: PROM;10 reps ABduction: PROM;10 reps Seated Elevation: AROM;15 reps Extension: AROM;15 reps Row: AROM;15 reps Other Seated Exercises: dc Pulleys Flexion: 2 minutes ABduction: 2 minutes Therapy Ball Flexion: 25 reps ABduction: 25 reps ROM / Strengthening / Isometric Strengthening Thumb Tacks: dc secondary to too painful and difficult to maintain position Prot/Ret//Elev/Dep: 1' Flexion: Theraband;5X5" Theraband Level (Flexion): Level 2 (Red) Extension: Theraband;5X5" Theraband Level (Extension): Level 2 (Red) External Rotation: Theraband;5X5" Theraband Level (External Rotation): Level 2 (Red) Internal Rotation: Theraband;5X5" Theraband Level (Internal Rotation): Level 2 (Red) ABduction: Theraband;5X5" Theraband Level (ABduction): Level 2  (Red) ADduction: Theraband;5X5" Theraband Level (ADduction): Level 2 (Red)    Manual Therapy Manual Therapy: Myofascial release Myofascial Release: MFR and scar release to right upper arm, anterior shoulder, posterior shoulder, and scapular region to decrease pain and restrictions and incrase pain free mobility in her RUE, within constraints of protocol.  Occupational Therapy Assessment and Plan OT Assessment and Plan Clinical Impression Statement: A:  Please refer to MD progress note.  Current PROM:  flexion 155, abduction 170, ER 50, IR 90. OT Frequency: Min 2X/week OT Duration: 4 weeks OT Plan: P:  Progress to AAROM in supine if MD allows, if not, continue PROM until 07/31/12 per protocol.  Increase to 3' with pulley exercises   Goals Short Term Goals Time to Complete Short Term Goals: 6 weeks Short Term Goal 1: Patient will be educated on a HEP. Short Term Goal 1 Progress: Met Short Term Goal 2: Patient will increase PROM to [facility] in her right shoulder region for increased I with dressing and bathing activities.  Short Term Goal 2 Progress: Met Short Term Goal 3: Patient will increase right shoulder strength to 3+/5 for increased ability to lift pots and pans at work. Short Term Goal 3 Progress: Progressing toward goal Short Term Goal 4: Patient will decrease fascial restrictions to min-mod in her right shoulder region for increased ability to complete daily activities.  Short Term Goal 4 Progress: Met Short Term Goal 5: Patient will decrease pain level to 5/10 in her right shoulder when she is combing her hair. Short Term Goal 5 Progress: Met Long Term Goals Time to Complete Long Term Goals: 12 weeks Long Term Goal 1: Patient will return to prior level of independence with all B/IADLs, work, leisure activities and improve her score on the UEFI to 85% or better. Long Term Goal 1 Progress: Progressing toward goal Long Term Goal 2: Patient will increase right shoulder  AROM to [facility] for  increased ability to reach into overhead cabinets in her kitchen. Long Term Goal 2 Progress: Progressing toward goal Long Term Goal 3: Patient will increase right shoulder strength to 4+/5 or better for increased ability to lift pots and pans at work. Long Term Goal 3 Progress: Progressing toward goal Long Term Goal 4: Patient will decrease pain level to 2/10 in her right shoulder when combing her hair. Long Term Goal 4 Progress: Progressing toward goal Long Term Goal 5: Patient will decrease fascial restrictions to minimal in her right shoulder region for increased mobility needed for ADL completion.  Long Term Goal 5 Progress: Progressing toward goal  Problem List Patient Active Problem List  Diagnosis  . DIAB W/UNSPEC COMP TYPE II/UNSPEC TYPE UNCNTRL  . OBESITY-MORBID (>100')  . ESOPHAGEAL REFLUX  . EDEMA  . Chest pain  . Essential hypertension, benign  . Shoulder arthritis  . Rotator cuff rupture, complete  . Pain in joint, shoulder region  . Muscle weakness (generalized)    End of Session Activity Tolerance: Patient tolerated treatment well General Behavior During Session: [facility] for tasks performed Cognition: [facility] for tasks performed  GO Functional Assessment Tool Used: UEFI scored 33/80 = 41% I level and 59% impairment level . Previously was 34% I level. Functional Limitation: Self care Self Care Current Status [phone]): At least 40 percent but less than 60 percent impaired, limited or restricted Self Care Goal Status ([id number]): At least 1 percent but less than 20 percent impaired, limited or restricted  Vangie Bicker, OTR/L  07/20/2012, 10:15 AM

## 2012-07-24 ENCOUNTER — Encounter: Payer: Self-pay | Admitting: Orthopedic Surgery

## 2012-07-24 ENCOUNTER — Ambulatory Visit (INDEPENDENT_AMBULATORY_CARE_PROVIDER_SITE_OTHER): Payer: Medicare Other | Admitting: Orthopedic Surgery

## 2012-07-24 VITALS — BP 160/72 | Ht 65.0 in | Wt 308.0 lb

## 2012-07-24 DIAGNOSIS — M75120 Complete rotator cuff tear or rupture of unspecified shoulder, not specified as traumatic: Secondary | ICD-10-CM

## 2012-07-24 DIAGNOSIS — Z9889 Other specified postprocedural states: Secondary | ICD-10-CM

## 2012-07-24 NOTE — Progress Notes (Signed)
Patient ID: [name] [name], female   DOB: 10-05-1955, 57 y.o.   MRN: [id number] Chief Complaint  Patient presents with  . Follow-up    6 week recheck on RCR. DOS 05-07-12. Postop week 11    BP 160/72  Ht 5' 5"  (1.651 m)  Wt 308 lb (139.708 kg)  BMI 51.25 kg/m2  Norco 10 mg pain reliever  Operative findings: Large rotator cuff tear involving supraspinatus and infraspinatus tendon with significant and severe tendon degeneration. There was approximately 2 cm of retraction. Large thickened subacromial bursitis. A.c. joint arthrosis.  PROCEDURE: #1 Open rotator cuff repair right shoulder procedure, #2 open distal clavicle excision   AROM =80 DEGREES  PROM = 170   INCISION IS CLEAN   COMPLETE THERAPY RETURN IN 3 MONTHS

## 2012-07-24 NOTE — Patient Instructions (Addendum)
FINISH THERAPY

## 2012-07-25 ENCOUNTER — Ambulatory Visit (HOSPITAL_COMMUNITY)
Admission: RE | Admit: 2012-07-25 | Discharge: 2012-07-25 | Disposition: A | Discharge status: Home / Self Care | Payer: Medicare Other | Source: Ambulatory Visit | Attending: Orthopedic Surgery | Admitting: Orthopedic Surgery

## 2012-07-25 DIAGNOSIS — E119 Type 2 diabetes mellitus without complications: Secondary | ICD-10-CM | POA: Insufficient documentation

## 2012-07-25 DIAGNOSIS — M6281 Muscle weakness (generalized): Secondary | ICD-10-CM | POA: Insufficient documentation

## 2012-07-25 DIAGNOSIS — IMO0001 Reserved for inherently not codable concepts without codable children: Secondary | ICD-10-CM | POA: Insufficient documentation

## 2012-07-25 DIAGNOSIS — M25519 Pain in unspecified shoulder: Secondary | ICD-10-CM | POA: Insufficient documentation

## 2012-07-25 NOTE — Progress Notes (Signed)
Occupational Therapy Treatment Patient Details  Name: [name] [name] MRN: [id number] Date of Birth: [birth year]  Today's Date: 07/25/2012 Time: 1655-3748 OT Time Calculation (min): 42 min Massage [phone] 12' Therex [phone] 30'  Visit#: 9  of 24   Re-eval: 08/17/12    Authorization: BCBS Medicare   Authorization Time Period: before 18th visit  Authorization Visit#: 9  of 18   Subjective Symptoms/Limitations Symptoms: S: Dr. Aline Brochure was really pleased with my progress. Pain Assessment Currently in Pain?: Yes Pain Score:   2 Pain Location: Shoulder Pain Orientation: Right Pain Type: Acute pain  Precautions/Restrictions  Precautions Precautions: Shoulder  Exercise/Treatments Supine Protraction: AAROM;10 reps Horizontal ABduction: AAROM;10 reps External Rotation: AAROM;10 reps Internal Rotation: AAROM;10 reps Flexion: AAROM;10 reps ABduction: AAROM;10 reps Seated Elevation: AROM;15 reps Extension: AROM;15 reps Row: AROM;15 reps Pulleys Flexion: 3 minutes ABduction: 3 minutes Therapy Ball Flexion: 25 reps ABduction: 25 reps ROM / Strengthening / Isometric Strengthening Prot/Ret//Elev/Dep: 2' Flexion: Theraband;5X5" Theraband Level (Flexion): Level 2 (Red) Extension: Theraband;5X5" Theraband Level (Extension): Level 2 (Red) External Rotation: Theraband;5X5" Theraband Level (External Rotation): Level 2 (Red) Internal Rotation: Theraband;5X5" Theraband Level (Internal Rotation): Level 2 (Red) ABduction: Theraband;5X5" Theraband Level (ABduction): Level 2 (Red) ADduction: Theraband;5X5" Theraband Level (ADduction): Level 2 (Red)    Manual Therapy Manual Therapy: Massage Massage: Massage and scar release to right upper arm, anterior shoulder, posterior shoulder, and scapular region to decrease pain and restrictions and incrase pain free mobility in her RUE, within constraints of protocol.  Occupational Therapy Assessment and Plan OT Assessment and  Plan Clinical Impression Statement: A: Added AAROM in supine. Tolerated well. Good form. OT Frequency: Min 2X/week OT Duration: 4 weeks OT Plan: P: Cont. to [facility] in seated.   Goals Short Term Goals Time to Complete Short Term Goals: 6 weeks Short Term Goal 1: Patient will be educated on a HEP. Short Term Goal 2: Patient will increase PROM to [facility] in her right shoulder region for increased I with dressing and bathing activities.  Short Term Goal 3: Patient will increase right shoulder strength to 3+/5 for increased ability to lift pots and pans at work. Short Term Goal 3 Progress: Progressing toward goal Short Term Goal 4: Patient will decrease fascial restrictions to min-mod in her right shoulder region for increased ability to complete daily activities.  Short Term Goal 5: Patient will decrease pain level to 5/10 in her right shoulder when she is combing her hair. Long Term Goals Time to Complete Long Term Goals: 12 weeks Long Term Goal 1: Patient will return to prior level of independence with all B/IADLs, work, leisure activities and improve her score on the UEFI to 85% or better. Long Term Goal 1 Progress: Progressing toward goal Long Term Goal 2: Patient will increase right shoulder AROM to [facility] for increased ability to reach into overhead cabinets in her kitchen. Long Term Goal 2 Progress: Progressing toward goal Long Term Goal 3: Patient will increase right shoulder strength to 4+/5 or better for increased ability to lift pots and pans at work. Long Term Goal 3 Progress: Progressing toward goal Long Term Goal 4: Patient will decrease pain level to 2/10 in her right shoulder when combing her hair. Long Term Goal 4 Progress: Progressing toward goal Long Term Goal 5: Patient will decrease fascial restrictions to minimal in her right shoulder region for increased mobility needed for ADL completion.  Long Term Goal 5 Progress: Progressing toward goal  Problem List Patient Active Problem  List  Diagnosis  .  DIAB W/UNSPEC COMP TYPE II/UNSPEC TYPE UNCNTRL  . OBESITY-MORBID (>100')  . ESOPHAGEAL REFLUX  . EDEMA  . Chest pain  . Essential hypertension, benign  . Shoulder arthritis  . Rotator cuff rupture, complete  . Pain in joint, shoulder region  . Muscle weakness (generalized)  . S/P complete repair of rotator cuff    End of Session Activity Tolerance: Patient tolerated treatment well General Behavior During Session: [facility] for tasks performed Cognition: [facility] for tasks performed   Ailene Ravel, OTR/L 07/25/2012, 10:15 AM

## 2012-07-27 ENCOUNTER — Ambulatory Visit (HOSPITAL_COMMUNITY): Payer: Medicare Other | Admitting: Specialist

## 2012-07-31 ENCOUNTER — Ambulatory Visit (HOSPITAL_COMMUNITY)
Admission: RE | Admit: 2012-07-31 | Discharge: 2012-07-31 | Disposition: A | Discharge status: Home / Self Care | Payer: Medicare Other | Source: Ambulatory Visit | Attending: Orthopedic Surgery | Admitting: Orthopedic Surgery

## 2012-07-31 DIAGNOSIS — M25519 Pain in unspecified shoulder: Secondary | ICD-10-CM

## 2012-07-31 DIAGNOSIS — M6281 Muscle weakness (generalized): Secondary | ICD-10-CM

## 2012-07-31 NOTE — Progress Notes (Signed)
Occupational Therapy Treatment Patient Details  Name: [name] [name] MRN: [id number] Date of Birth: 26-Aug-1955  Today's Date: 07/31/2012 Time: 1829-9371 OT Time Calculation (min): 42 min MFR [phone] 10' Therex [phone] 32'  Visit#: 10 of 24  Re-eval: 08/17/12    Authorization: BCBS Medicare   Authorization Time Period: before 18th visit  Authorization Visit#: 10 of 18  Subjective Symptoms/Limitations Symptoms: S: I'm a little sore because I slept on it. Pain Assessment Currently in Pain?: Yes Pain Score:   2 Pain Location: Shoulder Pain Orientation: Right Pain Type: Acute pain  Precautions/Restrictions   shoulder  Exercise/Treatments Supine Protraction: AAROM;12 reps Horizontal ABduction: AAROM;12 reps External Rotation: AAROM;12 reps Internal Rotation: AAROM;12 reps Flexion: AAROM;12 reps ABduction: AAROM;12 reps Seated Elevation: AROM;15 reps Extension: AROM;15 reps Row: AROM;15 reps Protraction: AAROM;10 reps Horizontal ABduction: AAROM;10 reps External Rotation: AAROM;10 reps Internal Rotation: AAROM;10 reps Flexion: AAROM;10 reps Abduction: AAROM;10 reps Standing External Rotation: Strengthening;Theraband;12 reps Theraband Level (Shoulder External Rotation): Level 2 (Red) Internal Rotation: Strengthening;Theraband;12 reps Theraband Level (Shoulder Internal Rotation): Level 2 (Red) Flexion: Strengthening;Theraband;12 reps Theraband Level (Shoulder Flexion): Level 2 (Red) Extension: Strengthening;Theraband;12 reps Theraband Level (Shoulder Extension): Level 2 (Red) Row: Strengthening;Theraband;12 reps Theraband Level (Shoulder Row): Level 2 (Red) Retraction: Strengthening;12 reps;Theraband Theraband Level (Shoulder Retraction): Level 2 (Red) Pulleys Flexion: 3 minutes ABduction: 3 minutes Therapy Ball Flexion: 25 reps ABduction: 25 reps ROM / Strengthening / Isometric Strengthening Prot/Ret//Elev/Dep: 2'     Manual Therapy Manual Therapy:  Myofascial release Myofascial Release: MFR and scar release to right upper arm, anterior shoulder, posterior shoulder, and scapular region to decrease pain and restrictions and incrase pain free mobility in her RUE, within constraints of protocol.  Occupational Therapy Assessment and Plan OT Assessment and Plan Clinical Impression Statement: A: Added AAROM in seated. Tolerated well. Good form. OT Frequency: Min 2X/week OT Duration: 4 weeks OT Plan: P: Cont. to work on performing exercises slow and controled.   Goals Short Term Goals Time to Complete Short Term Goals: 6 weeks Short Term Goal 1: Patient will be educated on a HEP. Short Term Goal 2: Patient will increase PROM to [facility] in her right shoulder region for increased I with dressing and bathing activities.  Short Term Goal 3: Patient will increase right shoulder strength to 3+/5 for increased ability to lift pots and pans at work. Short Term Goal 3 Progress: Progressing toward goal Short Term Goal 4: Patient will decrease fascial restrictions to min-mod in her right shoulder region for increased ability to complete daily activities.  Short Term Goal 5: Patient will decrease pain level to 5/10 in her right shoulder when she is combing her hair. Long Term Goals Time to Complete Long Term Goals: 12 weeks Long Term Goal 1: Patient will return to prior level of independence with all B/IADLs, work, leisure activities and improve her score on the UEFI to 85% or better. Long Term Goal 1 Progress: Progressing toward goal Long Term Goal 2: Patient will increase right shoulder AROM to [facility] for increased ability to reach into overhead cabinets in her kitchen. Long Term Goal 2 Progress: Progressing toward goal Long Term Goal 3: Patient will increase right shoulder strength to 4+/5 or better for increased ability to lift pots and pans at work. Long Term Goal 3 Progress: Progressing toward goal Long Term Goal 4: Patient will decrease pain level to  2/10 in her right shoulder when combing her hair. Long Term Goal 4 Progress: Progressing toward goal Long Term Goal 5: Patient  will decrease fascial restrictions to minimal in her right shoulder region for increased mobility needed for ADL completion.  Long Term Goal 5 Progress: Progressing toward goal  Problem List Patient Active Problem List  Diagnosis  . DIAB W/UNSPEC COMP TYPE II/UNSPEC TYPE UNCNTRL  . OBESITY-MORBID (>100')  . ESOPHAGEAL REFLUX  . EDEMA  . Chest pain  . Essential hypertension, benign  . Shoulder arthritis  . Rotator cuff rupture, complete  . Pain in joint, shoulder region  . Muscle weakness (generalized)  . S/P complete repair of rotator cuff    End of Session Activity Tolerance: Patient tolerated treatment well General Behavior During Session: [facility] for tasks performed Cognition: [facility] for tasks performed   Ailene Ravel, OTR/L  07/31/2012, 10:13 AM

## 2012-08-01 ENCOUNTER — Ambulatory Visit (HOSPITAL_COMMUNITY)
Admission: RE | Admit: 2012-08-01 | Discharge: 2012-08-01 | Disposition: A | Discharge status: Home / Self Care | Payer: Medicare Other | Source: Ambulatory Visit | Attending: Orthopedic Surgery | Admitting: Orthopedic Surgery

## 2012-08-01 DIAGNOSIS — M25519 Pain in unspecified shoulder: Secondary | ICD-10-CM

## 2012-08-01 DIAGNOSIS — M6281 Muscle weakness (generalized): Secondary | ICD-10-CM

## 2012-08-01 NOTE — Progress Notes (Signed)
Occupational Therapy Treatment Patient Details  Name: [name] [name] MRN: [id number] Date of Birth: 01-22-1956  Today's Date: 08/01/2012 Time: 5631-4970 OT Time Calculation (min): 39 min Manual Therapy 941-955 14' Therapeutic Exercises [phone] 25' Visit#: 11 of 24  Re-eval: 08/17/12    Authorization: BCBS Medicare   Authorization Time Period: before 18th visit  Authorization Visit#: 11 of 18  Subjective Symptoms/Limitations Symptoms: S:  I ve been staying busy using my arm at home.  Limitations:  2/11 begin AAROM when full PROM is achieved and begin AROM as tolerated. See scanned protocol for full details.  Pain Assessment Currently in Pain?: Yes Pain Score:   2 Pain Location: Shoulder Pain Orientation: Right Pain Type: Acute pain  Precautions/Restrictions    Limitations:  2/11 begin AAROM when full PROM is achieved and begin AROM as tolerated. See scanned protocol for full details.   Exercise/Treatments Supine Protraction: AAROM;15 reps;PROM;10 reps Horizontal ABduction: PROM;10 reps;AROM;15 reps External Rotation: PROM;10 reps;AAROM;15 reps Internal Rotation: PROM;10 reps;AAROM;15 reps Flexion: PROM;10 reps;AAROM;15 reps ABduction: PROM;10 reps;AAROM;15 reps Seated Extension: Theraband;10 reps Theraband Level (Shoulder Extension): Level 2 (Red) Row: Theraband;10 reps Theraband Level (Shoulder Row): Level 2 (Red) Protraction: AAROM;10 reps Horizontal ABduction: AAROM;10 reps;Limitations Horizontal ABduction Limitations: with OT assistance to maintain weight of bar External Rotation: AAROM;Theraband;10 reps Theraband Level (Shoulder External Rotation): Level 2 (Red) Internal Rotation: AAROM;Theraband;10 reps Theraband Level (Shoulder Internal Rotation): Level 2 (Red) Flexion: AAROM;10 reps Abduction: AAROM;10 reps Pulleys Flexion:  (dc) ABduction:  (dc) Therapy Ball Flexion: 25 reps ABduction: 25 reps Right/Left: 5 reps ROM / Strengthening / Isometric  Strengthening Wall Wash: 1' Thumb Tacks: 1' Prot/Ret//Elev/Dep: dc Flexion:  (dc)    Manual Therapy Manual Therapy: Myofascial release Myofascial Release: MFR and manual stretching to right upper arm, anterior shoulder region, posterior shoulder region and scapular.trapezius region t decrease pain and restrictions and increase pain free mobility in her RUE withing contstraints of protocol.   Occupational Therapy Assessment and Plan OT Assessment and Plan Clinical Impression Statement: A:  Requires facilitation with horiz abd AAROM in seated, limited to 50% AAROM flexio and abduction in seated due to weakness. OT Plan: P:  Add AROM in supine.  Attempt AAROM horizontal abd in seated without facilitation from OTR/L   Goals Short Term Goals Time to Complete Short Term Goals: 6 weeks Short Term Goal 1: Patient will be educated on a HEP. Short Term Goal 2: Patient will increase PROM to [facility] in her right shoulder region for increased I with dressing and bathing activities.  Short Term Goal 3: Patient will increase right shoulder strength to 3+/5 for increased ability to lift pots and pans at work. Short Term Goal 3 Progress: Progressing toward goal Short Term Goal 4: Patient will decrease fascial restrictions to min-mod in her right shoulder region for increased ability to complete daily activities.  Short Term Goal 5: Patient will decrease pain level to 5/10 in her right shoulder when she is combing her hair. Long Term Goals Time to Complete Long Term Goals: 12 weeks Long Term Goal 1: Patient will return to prior level of independence with all B/IADLs, work, leisure activities and improve her score on the UEFI to 85% or better. Long Term Goal 1 Progress: Progressing toward goal Long Term Goal 2: Patient will increase right shoulder AROM to [facility] for increased ability to reach into overhead cabinets in her kitchen. Long Term Goal 2 Progress: Progressing toward goal Long Term Goal 3: Patient will  increase right shoulder strength to  4+/5 or better for increased ability to lift pots and pans at work. Long Term Goal 3 Progress: Progressing toward goal Long Term Goal 4: Patient will decrease pain level to 2/10 in her right shoulder when combing her hair. Long Term Goal 4 Progress: Progressing toward goal Long Term Goal 5: Patient will decrease fascial restrictions to minimal in her right shoulder region for increased mobility needed for ADL completion.  Long Term Goal 5 Progress: Progressing toward goal  Problem List Patient Active Problem List  Diagnosis  . DIAB W/UNSPEC COMP TYPE II/UNSPEC TYPE UNCNTRL  . OBESITY-MORBID (>100')  . ESOPHAGEAL REFLUX  . EDEMA  . Chest pain  . Essential hypertension, benign  . Shoulder arthritis  . Rotator cuff rupture, complete  . Pain in joint, shoulder region  . Muscle weakness (generalized)  . S/P complete repair of rotator cuff    End of Session Activity Tolerance: Patient tolerated treatment well General Behavior During Session: [facility] for tasks performed Cognition: [facility] for tasks performed   Vangie Bicker, OTR/L  08/01/2012, 10:25 AM

## 2012-08-03 ENCOUNTER — Ambulatory Visit (HOSPITAL_COMMUNITY): Payer: Medicare Other

## 2012-08-04 ENCOUNTER — Other Ambulatory Visit: Payer: Self-pay

## 2012-08-07 ENCOUNTER — Ambulatory Visit (HOSPITAL_COMMUNITY)
Admission: RE | Admit: 2012-08-07 | Discharge: 2012-08-07 | Disposition: A | Discharge status: Home / Self Care | Payer: Medicare Other | Source: Ambulatory Visit | Attending: Orthopedic Surgery | Admitting: Orthopedic Surgery

## 2012-08-07 DIAGNOSIS — M25519 Pain in unspecified shoulder: Secondary | ICD-10-CM

## 2012-08-07 DIAGNOSIS — M6281 Muscle weakness (generalized): Secondary | ICD-10-CM

## 2012-08-07 NOTE — Progress Notes (Signed)
Occupational Therapy Treatment Patient Details  Name: RENU ASBY MRN: [id number] Date of Birth: 10-06-1955  Today's Date: 08/07/2012 Time: 5035-4656 OT Time Calculation (min): 35 min MFR 1357-1410 13' Therex [phone] 22'  Visit#: 12 of 24  Re-eval: 08/17/12    Authorization: BCBS Medicare   Authorization Time Period: before 18th visit  Authorization Visit#: 12 of 18  Subjective Symptoms/Limitations Symptoms: S: My knee has been giving me trouble. Pain Assessment Currently in Pain?: Yes Pain Score:   2 Pain Location: Shoulder Pain Orientation: Right Pain Type: Acute pain  Precautions/Restrictions     Exercise/Treatments Supine Protraction: PROM;10 reps;AROM;15 reps Horizontal ABduction: PROM;10 reps;AROM;15 reps External Rotation: PROM;10 reps;AROM;15 reps Internal Rotation: PROM;10 reps;AROM;15 reps Flexion: PROM;10 reps;AROM;15 reps ABduction: PROM;10 reps;AROM;15 reps Seated Elevation: AROM;15 reps Extension: AROM;15 reps Row: AROM;15 reps Protraction: AAROM;10 reps Horizontal ABduction: AAROM;10 reps External Rotation: AAROM;10 reps Internal Rotation: AAROM;10 reps Flexion: AAROM;10 reps Abduction: AAROM;10 reps Therapy Ball Flexion: 25 reps ABduction: 25 reps Right/Left: 5 reps ROM / Strengthening / Isometric Strengthening Wall Wash: 1' Thumb Tacks: 1'      Manual Therapy Manual Therapy: Myofascial release Myofascial Release: MFR and manual stretching to right upper arm, anterior shoulder region, posterior shoulder region and scapular.trapezius region t decrease pain and restrictions and increase pain free mobility in her RUE withing contstraints of protocol.   Occupational Therapy Assessment and Plan OT Assessment and Plan Clinical Impression Statement: A: required min vc's for technique during AROM and AAROM exercises. Tolerated AROM in supine OT Plan: P:  Work on increase technique of AROM exercises without cues from OTR/L   Goals Short  Term Goals Time to Complete Short Term Goals: 6 weeks Short Term Goal 1: Patient will be educated on a HEP. Short Term Goal 2: Patient will increase PROM to [facility] in her right shoulder region for increased I with dressing and bathing activities.  Short Term Goal 3: Patient will increase right shoulder strength to 3+/5 for increased ability to lift pots and pans at work. Short Term Goal 3 Progress: Progressing toward goal Short Term Goal 4: Patient will decrease fascial restrictions to min-mod in her right shoulder region for increased ability to complete daily activities.  Short Term Goal 5: Patient will decrease pain level to 5/10 in her right shoulder when she is combing her hair. Long Term Goals Time to Complete Long Term Goals: 12 weeks Long Term Goal 1: Patient will return to prior level of independence with all B/IADLs, work, leisure activities and improve her score on the UEFI to 85% or better. Long Term Goal 1 Progress: Progressing toward goal Long Term Goal 2: Patient will increase right shoulder AROM to [facility] for increased ability to reach into overhead cabinets in her kitchen. Long Term Goal 2 Progress: Progressing toward goal Long Term Goal 3: Patient will increase right shoulder strength to 4+/5 or better for increased ability to lift pots and pans at work. Long Term Goal 3 Progress: Progressing toward goal Long Term Goal 4: Patient will decrease pain level to 2/10 in her right shoulder when combing her hair. Long Term Goal 4 Progress: Progressing toward goal Long Term Goal 5: Patient will decrease fascial restrictions to minimal in her right shoulder region for increased mobility needed for ADL completion.  Long Term Goal 5 Progress: Progressing toward goal  Problem List Patient Active Problem List  Diagnosis  . DIAB W/UNSPEC COMP TYPE II/UNSPEC TYPE UNCNTRL  . OBESITY-MORBID (>100')  . ESOPHAGEAL REFLUX  . EDEMA  .  Chest pain  . Essential hypertension, benign  . Shoulder  arthritis  . Rotator cuff rupture, complete  . Pain in joint, shoulder region  . Muscle weakness (generalized)  . S/P complete repair of rotator cuff    End of Session Activity Tolerance: Patient tolerated treatment well General Behavior During Session: [facility] for tasks performed Cognition: [facility] for tasks performed   Ailene Ravel, OTR/L  08/07/2012, 2:37 PM

## 2012-08-09 ENCOUNTER — Ambulatory Visit (HOSPITAL_COMMUNITY)
Admission: RE | Admit: 2012-08-09 | Discharge: 2012-08-09 | Disposition: A | Discharge status: Home / Self Care | Payer: Medicare Other | Source: Ambulatory Visit | Attending: Orthopedic Surgery | Admitting: Orthopedic Surgery

## 2012-08-09 DIAGNOSIS — M6281 Muscle weakness (generalized): Secondary | ICD-10-CM

## 2012-08-09 DIAGNOSIS — M25519 Pain in unspecified shoulder: Secondary | ICD-10-CM

## 2012-08-09 NOTE — Progress Notes (Signed)
Occupational Therapy Treatment Patient Details  Name: DEVRA STARE MRN: [id number] Date of Birth: May 20, 1956  Today's Date: 08/09/2012 Time: 2951-8841 OT Time Calculation (min): 45 min MFR 930-940 10' Therex [phone] 35'  Visit#: 13 of 24  Re-eval: 08/17/12    Authorization: BCBS Medicare   Authorization Time Period: before 18th visit  Authorization Visit#: 13 of 18  Subjective Symptoms/Limitations Symptoms: S: I'm just a little sore from doing laundry. Pain Assessment Currently in Pain?: Yes Pain Score:   2 Pain Location: Shoulder Pain Orientation: Right Pain Type: Acute pain  Precautions/Restrictions   Shoulder  Exercise/Treatments Supine Protraction: PROM;10 reps;AROM;15 reps Horizontal ABduction: PROM;10 reps;AROM;15 reps External Rotation: PROM;10 reps;AROM;15 reps Internal Rotation: PROM;10 reps;AROM;15 reps Flexion: PROM;10 reps;AROM;15 reps ABduction: PROM;10 reps;AROM;15 reps Seated Elevation: AROM;15 reps Extension: AROM;15 reps Retraction: AROM;15 reps Row: AROM;15 reps Protraction: AAROM;10 reps Horizontal ABduction: AAROM;10 reps External Rotation: AAROM;10 reps Internal Rotation: AAROM;10 reps Flexion: AAROM;10 reps Abduction: AAROM;10 reps Standing External Rotation: Strengthening;Theraband;12 reps Theraband Level (Shoulder External Rotation): Level 2 (Red) Internal Rotation: Strengthening;Theraband;12 reps Theraband Level (Shoulder Internal Rotation): Level 2 (Red) Flexion: Strengthening;Theraband;12 reps Theraband Level (Shoulder Flexion): Level 2 (Red) Extension: Strengthening;Theraband;12 reps Theraband Level (Shoulder Extension): Level 2 (Red) Row: Strengthening;Theraband;12 reps Theraband Level (Shoulder Row): Level 2 (Red) Retraction: Strengthening;12 reps;Theraband Theraband Level (Shoulder Retraction): Level 2 (Red) Therapy Ball Flexion: 25 reps ABduction: 25 reps Right/Left: 5 reps ROM / Strengthening / Isometric  Strengthening Wall Wash: 1' Thumb Tacks: dc secondary to pain in shoulder Other ROM/Strengthening Exercises: Corner wall stretch x10      Manual Therapy Manual Therapy: Myofascial release Myofascial Release: MFR and manual stretching to right upper arm, anterior shoulder region, posterior shoulder region and scapular.trapezius region t decrease pain and restrictions and increase pain free mobility in her RUE withing contstraints of protocol.   Occupational Therapy Assessment and Plan OT Assessment and Plan Clinical Impression Statement: A: Required min vc's for technique during AROM and AAROM exercises. Tolerated AROM in supiine. Attempted some AROM in supine, but form was not correct enough to continue. Needed reminders to depress shoulder during exercises. OT Plan: P:  Work on increase technique of AROM exercises without cues from OTR/L   Goals Short Term Goals Time to Complete Short Term Goals: 6 weeks Short Term Goal 1: Patient will be educated on a HEP. Short Term Goal 2: Patient will increase PROM to [facility] in her right shoulder region for increased I with dressing and bathing activities.  Short Term Goal 3: Patient will increase right shoulder strength to 3+/5 for increased ability to lift pots and pans at work. Short Term Goal 3 Progress: Progressing toward goal Short Term Goal 4: Patient will decrease fascial restrictions to min-mod in her right shoulder region for increased ability to complete daily activities.  Short Term Goal 5: Patient will decrease pain level to 5/10 in her right shoulder when she is combing her hair. Long Term Goals Time to Complete Long Term Goals: 12 weeks Long Term Goal 1: Patient will return to prior level of independence with all B/IADLs, work, leisure activities and improve her score on the UEFI to 85% or better. Long Term Goal 1 Progress: Progressing toward goal Long Term Goal 2: Patient will increase right shoulder AROM to [facility] for increased ability to  reach into overhead cabinets in her kitchen. Long Term Goal 2 Progress: Progressing toward goal Long Term Goal 3: Patient will increase right shoulder strength to 4+/5 or better for increased ability to lift  pots and pans at work. Long Term Goal 3 Progress: Progressing toward goal Long Term Goal 4: Patient will decrease pain level to 2/10 in her right shoulder when combing her hair. Long Term Goal 4 Progress: Progressing toward goal Long Term Goal 5: Patient will decrease fascial restrictions to minimal in her right shoulder region for increased mobility needed for ADL completion.  Long Term Goal 5 Progress: Progressing toward goal  Problem List Patient Active Problem List  Diagnosis  . DIAB W/UNSPEC COMP TYPE II/UNSPEC TYPE UNCNTRL  . OBESITY-MORBID (>100')  . ESOPHAGEAL REFLUX  . EDEMA  . Chest pain  . Essential hypertension, benign  . Shoulder arthritis  . Rotator cuff rupture, complete  . Pain in joint, shoulder region  . Muscle weakness (generalized)  . S/P complete repair of rotator cuff    End of Session Activity Tolerance: Patient tolerated treatment well General Behavior During Session: [facility] for tasks performed Cognition: [facility] for tasks performed   Ailene Ravel, OTR/L  08/09/2012, 10:11 AM

## 2012-08-14 ENCOUNTER — Ambulatory Visit (HOSPITAL_COMMUNITY)
Admission: RE | Admit: 2012-08-14 | Discharge: 2012-08-14 | Disposition: A | Discharge status: Home / Self Care | Payer: Medicare Other | Source: Ambulatory Visit | Attending: Orthopedic Surgery | Admitting: Orthopedic Surgery

## 2012-08-14 DIAGNOSIS — M6281 Muscle weakness (generalized): Secondary | ICD-10-CM

## 2012-08-14 DIAGNOSIS — M25511 Pain in right shoulder: Secondary | ICD-10-CM

## 2012-08-14 NOTE — Progress Notes (Signed)
Occupational Therapy Treatment Patient Details  Name: [name] [name] MRN: [id number] Date of Birth: 1955/12/19  Today's Date: 08/14/2012 Time: 1020-1052 OT Time Calculation (min): 32 min Manual therapy  1020-1028 8' Therapeutic Exercises [phone] 24' Visit#: 14 of 24  Re-eval: 08/17/12    Authorization: BCBS Medicare   Authorization Time Period: before 18th visit  Authorization Visit#: 14 of 18  Subjective Symptoms/Limitations Symptoms: S: I havent had any pain this am. Limitations:  2/11 begin AAROM when full PROM is achieved and begin AROM as tolerated. See scanned protocol for full details.  Pain Assessment Currently in Pain?: No/denies Pain Score: 0-No pain  Precautions/Restrictions     Exercise/Treatments Supine Protraction: PROM;10 reps;AROM;15 reps Horizontal ABduction: PROM;10 reps;AROM;15 reps External Rotation: PROM;10 reps;AROM;15 reps Internal Rotation: PROM;10 reps;AROM;15 reps Flexion: PROM;10 reps;AROM;15 reps ABduction: PROM;10 reps;AROM;15 reps Seated Extension: Theraband;15 reps Theraband Level (Shoulder Extension): Level 3 (Green) Retraction: Theraband;15 reps Theraband Level (Shoulder Retraction): Level 3 (Green) Row: Theraband;15 reps Theraband Level (Shoulder Row): Level 3 (Green) Protraction: AROM;10 reps Horizontal ABduction: AROM;10 reps External Rotation: AROM;10 reps;Theraband;15 reps Theraband Level (Shoulder External Rotation): Level 3 (Green) Internal Rotation: AROM;10 reps;Theraband;15 reps Theraband Level (Shoulder Internal Rotation): Level 3 (Green) Flexion: AROM;10 reps Abduction: AROM;10 reps Therapy Ball Flexion: Other (comment) (dc) ABduction: Other (comment) (dc) Right/Left: 5 reps (dc after this visit) ROM / Strengthening / Isometric Strengthening UBE (Upper Arm Bike): 2' and 2' 1.0 Wall Wash: 2'      Manual Therapy Manual Therapy: Myofascial release Myofascial Release: MFR and manual stretching to right upper arm,  anterior shoulder region, posterior shoulder region and scapular.trapezius region t decrease pain and restrictions and increase pain free mobility in her RUE withing contstraints of protocol.  Occupational Therapy Assessment and Plan OT Assessment and Plan Clinical Impression Statement: A:  Full AROM in supine, and began AROM in seated.  Required min facilitation with AROM for flexion, abduction, horizontal abduction due to weakness, not pain.   OT Plan: P:  Begin strengthening in supine in order to build strength needed to reach overhead in seated or standing position.  REASSESS.   Goals Short Term Goals Time to Complete Short Term Goals: 6 weeks Short Term Goal 1: Patient will be educated on a HEP. Short Term Goal 2: Patient will increase PROM to [facility] in her right shoulder region for increased I with dressing and bathing activities.  Short Term Goal 3: Patient will increase right shoulder strength to 3+/5 for increased ability to lift pots and pans at work. Short Term Goal 3 Progress: Progressing toward goal Short Term Goal 4: Patient will decrease fascial restrictions to min-mod in her right shoulder region for increased ability to complete daily activities.  Short Term Goal 5: Patient will decrease pain level to 5/10 in her right shoulder when she is combing her hair. Long Term Goals Time to Complete Long Term Goals: 12 weeks Long Term Goal 1: Patient will return to prior level of independence with all B/IADLs, work, leisure activities and improve her score on the UEFI to 85% or better. Long Term Goal 1 Progress: Progressing toward goal Long Term Goal 2: Patient will increase right shoulder AROM to [facility] for increased ability to reach into overhead cabinets in her kitchen. Long Term Goal 2 Progress: Progressing toward goal Long Term Goal 3: Patient will increase right shoulder strength to 4+/5 or better for increased ability to lift pots and pans at work. Long Term Goal 3 Progress:  Progressing toward goal Long Term Goal  4: Patient will decrease pain level to 2/10 in her right shoulder when combing her hair. Long Term Goal 4 Progress: Progressing toward goal Long Term Goal 5: Patient will decrease fascial restrictions to minimal in her right shoulder region for increased mobility needed for ADL completion.  Long Term Goal 5 Progress: Progressing toward goal  Problem List Patient Active Problem List  Diagnosis  . DIAB W/UNSPEC COMP TYPE II/UNSPEC TYPE UNCNTRL  . OBESITY-MORBID (>100')  . ESOPHAGEAL REFLUX  . EDEMA  . Chest pain  . Essential hypertension, benign  . Shoulder arthritis  . Rotator cuff rupture, complete  . Pain in joint, shoulder region  . Muscle weakness (generalized)  . S/P complete repair of rotator cuff    End of Session Activity Tolerance: Patient tolerated treatment well General Behavior During Session: [facility] for tasks performed Cognition: [facility] for tasks performed  [place], OTR/L  08/14/2012, 10:52 AM

## 2012-08-16 ENCOUNTER — Ambulatory Visit (HOSPITAL_COMMUNITY)
Admission: RE | Admit: 2012-08-16 | Discharge: 2012-08-16 | Disposition: A | Discharge status: Home / Self Care | Payer: Medicare Other | Source: Ambulatory Visit | Attending: Orthopedic Surgery | Admitting: Orthopedic Surgery

## 2012-08-16 DIAGNOSIS — M25511 Pain in right shoulder: Secondary | ICD-10-CM

## 2012-08-16 DIAGNOSIS — M6281 Muscle weakness (generalized): Secondary | ICD-10-CM

## 2012-08-16 NOTE — Progress Notes (Signed)
Occupational Therapy Treatment Patient Details  Name: [name] [name] MRN: [id number] Date of Birth: 20-Apr-1956  Today's Date: 08/16/2012 Time: 5573-2202 OT Time Calculation (min): 45 min Manual Therapy 10' Reassess 5' Therapeutic Exercises 30' Visit#: 15 of 24  Re-eval: 09/13/12    Authorization: BCBS Medicare   Authorization Time Period: before 25th visit  Authorization Visit#: 15 of 25  Subjective S:  Im getting a workout putting those pads under momma Limitations:  2/11 begin AAROM when full PROM is achieved and begin AROM as tolerated. See scanned protocol for full details.  Special Tests: UEFI was 41% and is currently Pain Assessment Currently in Pain?: No/denies Pain Score: 0-No pain   Exercise/Treatments Supine Protraction: PROM;Strengthening;10 reps Protraction Weight (lbs): 1 Horizontal ABduction: PROM;Strengthening;10 reps Horizontal ABduction Weight (lbs): 1 External Rotation: PROM;Strengthening;10 reps External Rotation Weight (lbs): 1 Internal Rotation: PROM;Strengthening;10 reps Internal Rotation Weight (lbs): 1 Flexion: PROM;Strengthening;10 reps Shoulder Flexion Weight (lbs): 1 ABduction: PROM;Strengthening;10 reps Shoulder ABduction Weight (lbs): 1 Seated Protraction: AROM;10 reps Horizontal ABduction: AROM;10 reps Horizontal ABduction Limitations: no assistance required this date External Rotation: AROM;10 reps Internal Rotation: AROM;10 reps Flexion: AROM;10 reps Abduction: AROM;10 reps ROM / Strengthening / Isometric Strengthening UBE (Upper Arm Bike): 3' and 3' 1.5 Wall Wash: 2' "W" Arms: 10 reps attempted but unable to do  X to V Arms: 10 reps Proximal Shoulder Strengthening, Seated: 10 reps        Manual Therapy Manual Therapy: Myofascial release Myofascial Release: MFR and manual stretching to right upper arm, anterior shoulder region, posterior shoulder region and scapular.trapezius region t decrease pain and restrictions and  increase pain free mobility in her RUE withing contstraints of protocol.  Occupational Therapy Assessment and Plan OT Assessment and Plan Clinical Impression Statement: A:  ROM/Strength: (07/20/12 supine PROM)  seated AROM:  flexion 145 3+/5 (155), abduction 145 3+/5 (170), external rotation with shoulder abducted 55 4-/5 (shoulder adducted 50), internal rotation with shoulder abducted  74 4-/5 (shoulder adducted 90).   OT Frequency: Min 2X/week OT Duration: 4 weeks OT Plan: P:  Increase reps with strengthening in supine.  Increase time with wall wash activity, attempt W arm for scapular stability.   Continue 2 times a week x 4 weeks.    Goals Short Term Goals Time to Complete Short Term Goals: 6 weeks Short Term Goal 1: Patient will be educated on a HEP. Short Term Goal 1 Progress: Met Short Term Goal 2: Patient will increase PROM to [facility] in her right shoulder region for increased I with dressing and bathing activities.  Short Term Goal 2 Progress: Met Short Term Goal 3: Patient will increase right shoulder strength to 3+/5 for increased ability to lift pots and pans at work. Short Term Goal 3 Progress: Met Short Term Goal 4: Patient will decrease fascial restrictions to min-mod in her right shoulder region for increased ability to complete daily activities.  Short Term Goal 4 Progress: Met Short Term Goal 5: Patient will decrease pain level to 5/10 in her right shoulder when she is combing her hair. Short Term Goal 5 Progress: Met Long Term Goals Time to Complete Long Term Goals: 12 weeks Long Term Goal 1: Patient will return to prior level of independence with all B/IADLs, work, leisure activities and improve her score on the UEFI to 85% or better. Long Term Goal 1 Progress: Progressing toward goal Long Term Goal 2: Patient will increase right shoulder AROM to [facility] for increased ability to reach into overhead cabinets in  her kitchen. Long Term Goal 2 Progress: Progressing toward  goal Long Term Goal 3: Patient will increase right shoulder strength to 4+/5 or better for increased ability to lift pots and pans at work. Long Term Goal 3 Progress: Progressing toward goal Long Term Goal 4: Patient will decrease pain level to 2/10 in her right shoulder when combing her hair. Long Term Goal 4 Progress: Met Long Term Goal 5: Patient will decrease fascial restrictions to minimal in her right shoulder region for increased mobility needed for ADL completion.  Long Term Goal 5 Progress: Met  Problem List Patient Active Problem List  Diagnosis  . DIAB W/UNSPEC COMP TYPE II/UNSPEC TYPE UNCNTRL  . OBESITY-MORBID (>100')  . ESOPHAGEAL REFLUX  . EDEMA  . Chest pain  . Essential hypertension, benign  . Shoulder arthritis  . Rotator cuff rupture, complete  . Pain in joint, shoulder region  . Muscle weakness (generalized)  . S/P complete repair of rotator cuff    End of Session Activity Tolerance: Patient tolerated treatment well General Behavior During Session: [facility] for tasks performed Cognition: [facility] for tasks performed OT Plan of Care OT Home Exercise Plan: issued tband for scapular stability/strengthening  GO Functional Assessment Tool Used: UEFI scored74/80 = 92% I level, 8% impairment level, was 41% I level. Functional Limitation: Self care Self Care Current Status [phone]): At least 1 percent but less than 20 percent impaired, limited or restricted Self Care Goal Status ([id number]): 0 percent impaired, limited or restricted  Vangie Bicker, OTR/L  08/16/2012, 9:37 AM

## 2012-08-21 ENCOUNTER — Ambulatory Visit (HOSPITAL_COMMUNITY): Payer: Medicare Other

## 2012-08-23 ENCOUNTER — Ambulatory Visit (HOSPITAL_COMMUNITY)
Admission: RE | Admit: 2012-08-23 | Discharge: 2012-08-23 | Disposition: A | Discharge status: Home / Self Care | Payer: Medicare Other | Source: Ambulatory Visit | Attending: Orthopedic Surgery | Admitting: Orthopedic Surgery

## 2012-08-23 DIAGNOSIS — M25511 Pain in right shoulder: Secondary | ICD-10-CM

## 2012-08-23 DIAGNOSIS — M6281 Muscle weakness (generalized): Secondary | ICD-10-CM

## 2012-08-23 DIAGNOSIS — IMO0001 Reserved for inherently not codable concepts without codable children: Secondary | ICD-10-CM | POA: Insufficient documentation

## 2012-08-23 DIAGNOSIS — E119 Type 2 diabetes mellitus without complications: Secondary | ICD-10-CM | POA: Insufficient documentation

## 2012-08-23 DIAGNOSIS — M25519 Pain in unspecified shoulder: Secondary | ICD-10-CM | POA: Insufficient documentation

## 2012-08-23 NOTE — Progress Notes (Signed)
Occupational Therapy Treatment Patient Details  Name: CYBIL SENEGAL MRN: [id number] Date of Birth: Oct 07, 1955  Today's Date: 08/23/2012 Time: 7628-3151 OT Time Calculation (min): 47 min MFR 930-950 20' Therex [phone] 27'  Visit#: 16 of 24  Re-eval: 09/13/12    Authorization: BCBS Medicare   Authorization Time Period: before 25th visit  Authorization Visit#: 16 of 25  Subjective Symptoms/Limitations Symptoms: S: My arm is still sore from moving my momma around the other day. Pain Assessment Currently in Pain?: Yes Pain Score:   3 Pain Location: Shoulder Pain Orientation: Right Pain Type: Acute pain  Precautions/Restrictions   Shoulder  Exercise/Treatments Supine Protraction: PROM;Strengthening;12 reps Protraction Weight (lbs): 1 Horizontal ABduction: PROM;Strengthening;12 reps Horizontal ABduction Weight (lbs): 1 External Rotation: PROM;Strengthening;12 reps External Rotation Weight (lbs): 1 Internal Rotation: PROM;Strengthening;12 reps Internal Rotation Weight (lbs): 1 Flexion: PROM;Strengthening;12 reps Shoulder Flexion Weight (lbs): 1 ABduction: PROM;Strengthening;12 reps Shoulder ABduction Weight (lbs): 1 Seated Protraction: AROM;12 reps Horizontal ABduction: AROM;12 reps External Rotation: AROM;12 reps Internal Rotation: AROM;12 reps Flexion: AROM;12 reps Abduction: AROM;12 reps ROM / Strengthening / Isometric Strengthening UBE (Upper Arm Bike): 3' and 3' 1.5 Wall Wash: 3' "W" Arms: 10 reps X to V Arms: 10 reps      Manual Therapy Manual Therapy: Myofascial release Myofascial Release: MFR and manual stretching to right upper arm, anterior shoulder region, posterior shoulder region and scapular.trapezius region t decrease pain and restrictions and increase pain free mobility in her RUE withing contstraints of protocol.  Occupational Therapy Assessment and Plan OT Assessment and Plan Clinical Impression Statement: A: Increased reps of strengthening  exercises in supine. Tolerated well. Needed min vc's for proper positioning during exercises. OT Frequency: Min 2X/week OT Duration: 4 weeks OT Plan: P: Cont. to work on decreasing cues during exercises.   Goals Short Term Goals Time to Complete Short Term Goals: 6 weeks Short Term Goal 1: Patient will be educated on a HEP. Short Term Goal 2: Patient will increase PROM to [facility] in her right shoulder region for increased I with dressing and bathing activities.  Short Term Goal 3: Patient will increase right shoulder strength to 3+/5 for increased ability to lift pots and pans at work. Short Term Goal 4: Patient will decrease fascial restrictions to min-mod in her right shoulder region for increased ability to complete daily activities.  Short Term Goal 5: Patient will decrease pain level to 5/10 in her right shoulder when she is combing her hair. Long Term Goals Time to Complete Long Term Goals: 12 weeks Long Term Goal 1: Patient will return to prior level of independence with all B/IADLs, work, leisure activities and improve her score on the UEFI to 85% or better. Long Term Goal 1 Progress: Progressing toward goal Long Term Goal 2: Patient will increase right shoulder AROM to [facility] for increased ability to reach into overhead cabinets in her kitchen. Long Term Goal 2 Progress: Progressing toward goal Long Term Goal 3: Patient will increase right shoulder strength to 4+/5 or better for increased ability to lift pots and pans at work. Long Term Goal 3 Progress: Progressing toward goal Long Term Goal 4: Patient will decrease pain level to 2/10 in her right shoulder when combing her hair. Long Term Goal 5: Patient will decrease fascial restrictions to minimal in her right shoulder region for increased mobility needed for ADL completion.   Problem List Patient Active Problem List  Diagnosis  . DIAB W/UNSPEC COMP TYPE II/UNSPEC TYPE UNCNTRL  . OBESITY-MORBID (>100')  .  ESOPHAGEAL REFLUX  . EDEMA   . Chest pain  . Essential hypertension, benign  . Shoulder arthritis  . Rotator cuff rupture, complete  . Pain in joint, shoulder region  . Muscle weakness (generalized)  . S/P complete repair of rotator cuff    End of Session Activity Tolerance: Patient tolerated treatment well General Behavior During Session: [facility] for tasks performed Cognition: [facility] for tasks performed   Ailene Ravel, OTR/L  08/23/2012, 10:17 AM

## 2012-08-24 ENCOUNTER — Ambulatory Visit (HOSPITAL_COMMUNITY): Payer: Medicare Other | Admitting: Specialist

## 2012-08-28 ENCOUNTER — Ambulatory Visit (HOSPITAL_COMMUNITY)
Admission: RE | Admit: 2012-08-28 | Discharge: 2012-08-28 | Disposition: A | Discharge status: Home / Self Care | Payer: Medicare Other | Source: Ambulatory Visit | Attending: Orthopedic Surgery | Admitting: Orthopedic Surgery

## 2012-08-28 DIAGNOSIS — M6281 Muscle weakness (generalized): Secondary | ICD-10-CM

## 2012-08-28 DIAGNOSIS — M25511 Pain in right shoulder: Secondary | ICD-10-CM

## 2012-08-28 NOTE — Progress Notes (Signed)
Occupational Therapy Treatment Patient Details  Name: KYMBERLIE BRAZEAU MRN: [id number] Date of Birth: [birth year]  Today's Date: 08/28/2012 Time: 5102-5852 OT Time Calculation (min): 51 min MFR 7782-4235 20' Therex 3614-4315 31'  Visit#: 17 of 24  Re-eval: 09/13/12    Authorization: BCBS Medicare   Authorization Time Period: before 25th visit  Authorization Visit#: 17 of 25  Subjective Symptoms/Limitations Symptoms: S: I told Dr. Aline Brochure that my shoulder is hurting from moving my momma all around. He admitted her upstairs. Pain Assessment Currently in Pain?: Yes Pain Score:   5 Pain Location: Shoulder Pain Orientation: Right Pain Type: Acute pain  Precautions/Restrictions  Precautions Precautions: Shoulder  Exercise/Treatments Supine Protraction: PROM;Strengthening;12 reps Protraction Weight (lbs): 1 Horizontal ABduction: PROM;Strengthening;12 reps Horizontal ABduction Weight (lbs): 1 External Rotation: PROM;Strengthening;12 reps External Rotation Weight (lbs): 1 Internal Rotation: PROM;Strengthening;12 reps Internal Rotation Weight (lbs): 1 Flexion: PROM;Strengthening;12 reps Shoulder Flexion Weight (lbs): 1 ABduction: PROM;Strengthening;12 reps Shoulder ABduction Weight (lbs): 1 Seated Protraction: AROM;12 reps Horizontal ABduction: AROM;12 reps External Rotation: AROM;12 reps Internal Rotation: AROM;12 reps Flexion: AROM;12 reps Abduction: AROM;12 reps Standing External Rotation: Strengthening;Theraband;12 reps Theraband Level (Shoulder External Rotation): Level 2 (Red) Internal Rotation: Strengthening;Theraband;12 reps Theraband Level (Shoulder Internal Rotation): Level 2 (Red) Flexion: Strengthening;Theraband;12 reps Theraband Level (Shoulder Flexion): Level 2 (Red) Extension: Strengthening;Theraband;12 reps Theraband Level (Shoulder Extension): Level 2 (Red) Row: Strengthening;Theraband;12 reps Theraband Level (Shoulder Row): Level 2  (Red) Retraction: Strengthening;12 reps;Theraband Theraband Level (Shoulder Retraction): Level 2 (Red) ROM / Strengthening / Isometric Strengthening UBE (Upper Arm Bike): 3' and 3' 1.5 Wall Wash: 3' "W" Arms: 10X X to V Arms: 10X Proximal Shoulder Strengthening, Seated: 4 sets 10X      Manual Therapy Manual Therapy: Myofascial release Myofascial Release: MFR and manual stretching to right upper arm, anterior shoulder region, posterior shoulder region and scapular.trapezius region t decrease pain and restrictions and increase pain free mobility in her RUE withing contstraints of protocol.  Occupational Therapy Assessment and Plan OT Assessment and Plan Clinical Impression Statement: A: Complained of her shoulder being sore from moving her momma back and forth in bed. MFR and manual stretching did help decrease pain level. Less cues needed during exercises for form. OT Frequency: Min 2X/week OT Duration: 4 weeks OT Plan: P: Cont. to work on proper form during exercises. 2/10 pain level at end of session.  Goals Short Term Goals Time to Complete Short Term Goals: 6 weeks Short Term Goal 1: Patient will be educated on a HEP. Short Term Goal 2: Patient will increase PROM to [facility] in her right shoulder region for increased I with dressing and bathing activities.  Short Term Goal 3: Patient will increase right shoulder strength to 3+/5 for increased ability to lift pots and pans at work. Short Term Goal 4: Patient will decrease fascial restrictions to min-mod in her right shoulder region for increased ability to complete daily activities.  Short Term Goal 5: Patient will decrease pain level to 5/10 in her right shoulder when she is combing her hair. Long Term Goals Time to Complete Long Term Goals: 12 weeks Long Term Goal 1: Patient will return to prior level of independence with all B/IADLs, work, leisure activities and improve her score on the UEFI to 85% or better. Long Term Goal 1  Progress: Progressing toward goal Long Term Goal 2: Patient will increase right shoulder AROM to [facility] for increased ability to reach into overhead cabinets in her kitchen. Long Term Goal 2 Progress: Progressing toward goal Long  Term Goal 3: Patient will increase right shoulder strength to 4+/5 or better for increased ability to lift pots and pans at work. Long Term Goal 3 Progress: Progressing toward goal Long Term Goal 4: Patient will decrease pain level to 2/10 in her right shoulder when combing her hair. Long Term Goal 5: Patient will decrease fascial restrictions to minimal in her right shoulder region for increased mobility needed for ADL completion.   Problem List Patient Active Problem List  Diagnosis  . DIAB W/UNSPEC COMP TYPE II/UNSPEC TYPE UNCNTRL  . OBESITY-MORBID (>100')  . ESOPHAGEAL REFLUX  . EDEMA  . Chest pain  . Essential hypertension, benign  . Shoulder arthritis  . Rotator cuff rupture, complete  . Pain in joint, shoulder region  . Muscle weakness (generalized)  . S/P complete repair of rotator cuff    End of Session Activity Tolerance: Patient tolerated treatment well General Behavior During Session: [facility] for tasks performed Cognition: [facility] for tasks performed   Ailene Ravel, OTR/L  08/28/2012, 3:16 PM

## 2012-08-30 ENCOUNTER — Ambulatory Visit (HOSPITAL_COMMUNITY)
Admission: RE | Admit: 2012-08-30 | Discharge: 2012-08-30 | Disposition: A | Discharge status: Home / Self Care | Payer: Medicare Other | Source: Ambulatory Visit | Attending: Orthopedic Surgery | Admitting: Orthopedic Surgery

## 2012-08-30 NOTE — Progress Notes (Signed)
Occupational Therapy Treatment Patient Details  Name: [name] [name] MRN: [id number] Date of Birth: [birth year]  Today's Date: 08/30/2012 Time: 8177-1165 OT Time Calculation (min): 47 min MFR 7903-8333 14' Therex 8329-1916 33'  Visit#: 18 of 24  Re-eval: 09/13/12    Authorization: [employer] Medicare   Authorization Time Period: before 25th visit  Authorization Visit#: 18 of 25  Subjective Symptoms/Limitations Symptoms: S: My shoulder is a little sore today. But it's not as bad as it was. Pain Assessment Currently in Pain?: Yes Pain Score:   3 Pain Location: Shoulder Pain Orientation: Right Pain Type: Acute pain  Precautions/Restrictions  Precautions Precautions: Shoulder  Exercise/Treatments Supine Protraction: PROM;Strengthening;15 reps Protraction Weight (lbs): 1 Horizontal ABduction: PROM;Strengthening;15 reps Horizontal ABduction Weight (lbs): 1 External Rotation: PROM;Strengthening;15 reps External Rotation Weight (lbs): 1 Internal Rotation: PROM;Strengthening;15 reps Internal Rotation Weight (lbs): 1 Flexion: PROM;Strengthening;15 reps Shoulder Flexion Weight (lbs): 1 ABduction: PROM;Strengthening;15 reps Shoulder ABduction Weight (lbs): 1 Seated Protraction: AROM;15 reps Horizontal ABduction: AROM;15 reps External Rotation: AROM;15 reps Internal Rotation: AROM;15 reps Flexion: AROM;15 reps Abduction: AROM;15 reps Standing External Rotation: Strengthening;Theraband;15 reps Theraband Level (Shoulder External Rotation): Level 3 (Green) Internal Rotation: Strengthening;Theraband;12 reps Theraband Level (Shoulder Internal Rotation): Level 3 (Green) Flexion: Strengthening;Theraband;15 reps Theraband Level (Shoulder Flexion): Level 3 (Green) Extension: Strengthening;Theraband;15 reps Theraband Level (Shoulder Extension): Level 3 (Green) Row: Strengthening;Theraband;15 reps Theraband Level (Shoulder Row): Level 3 (Green) Retraction: Strengthening;Theraband;15  reps Theraband Level (Shoulder Retraction): Level 3 (Green) ROM / Strengthening / Isometric Strengthening UBE (Upper Arm Bike): 3' and 3' 2.0 "W" Arms: 10X X to V Arms: 10X      Manual Therapy Manual Therapy: Myofascial release Myofascial Release: MFR and manual stretching to left upper arm, scapular, and shoulder region, and elbow region to decrease pain and fascial restrictions and increase pain free mobility to [facility] within parameters of protocol.   Occupational Therapy Assessment and Plan OT Assessment and Plan Clinical Impression Statement: A: Min cues needed for form during exercises. Increased theraband to green. Pt tolerated well. OT Frequency: Min 2X/week OT Duration: 4 weeks OT Plan: P: Cont. to work on proper form during exercises.   Goals Short Term Goals Time to Complete Short Term Goals: 6 weeks Short Term Goal 1: Patient will be educated on a HEP. Short Term Goal 2: Patient will increase PROM to [facility] in her right shoulder region for increased I with dressing and bathing activities.  Short Term Goal 3: Patient will increase right shoulder strength to 3+/5 for increased ability to lift pots and pans at work. Short Term Goal 4: Patient will decrease fascial restrictions to min-mod in her right shoulder region for increased ability to complete daily activities.  Short Term Goal 5: Patient will decrease pain level to 5/10 in her right shoulder when she is combing her hair. Long Term Goals Time to Complete Long Term Goals: 12 weeks Long Term Goal 1: Patient will return to prior level of independence with all B/IADLs, work, leisure activities and improve her score on the UEFI to 85% or better. Long Term Goal 1 Progress: Progressing toward goal Long Term Goal 2: Patient will increase right shoulder AROM to [facility] for increased ability to reach into overhead cabinets in her kitchen. Long Term Goal 2 Progress: Progressing toward goal Long Term Goal 3: Patient will increase right  shoulder strength to 4+/5 or better for increased ability to lift pots and pans at work. Long Term Goal 3 Progress: Progressing toward goal Long Term Goal 4: Patient will decrease  pain level to 2/10 in her right shoulder when combing her hair. Long Term Goal 5: Patient will decrease fascial restrictions to minimal in her right shoulder region for increased mobility needed for ADL completion.   Problem List Patient Active Problem List  Diagnosis  . DIAB W/UNSPEC COMP TYPE II/UNSPEC TYPE UNCNTRL  . OBESITY-MORBID (>100')  . ESOPHAGEAL REFLUX  . EDEMA  . Chest pain  . Essential hypertension, benign  . Shoulder arthritis  . Rotator cuff rupture, complete  . Pain in joint, shoulder region  . Muscle weakness (generalized)  . S/P complete repair of rotator cuff    End of Session Activity Tolerance: Patient tolerated treatment well General Behavior During Session: [facility] for tasks performed Cognition: [facility] for tasks performed   Ailene Ravel, OTR/L  08/30/2012, 11:02 AM

## 2012-09-04 ENCOUNTER — Ambulatory Visit (HOSPITAL_COMMUNITY)
Admission: RE | Admit: 2012-09-04 | Discharge: 2012-09-04 | Disposition: A | Discharge status: Home / Self Care | Payer: Medicare Other | Source: Ambulatory Visit | Attending: Orthopedic Surgery | Admitting: Orthopedic Surgery

## 2012-09-04 DIAGNOSIS — M25511 Pain in right shoulder: Secondary | ICD-10-CM

## 2012-09-04 DIAGNOSIS — M6281 Muscle weakness (generalized): Secondary | ICD-10-CM

## 2012-09-04 NOTE — Progress Notes (Signed)
Occupational Therapy Treatment Patient Details  Name: [name] [name] MRN: [id number] Date of Birth: Oct 07, 1955  Today's Date: 09/04/2012 Time: 9937-1696 OT Time Calculation (min): 55 min MFR 1016-1030 14' Therex 7893-8101 41'  Visit#: 19 of 24  Re-eval: 09/13/12    Authorization: [employer] Medicare   Authorization Time Period: before 25th visit  Authorization Visit#: 19 of 25  Subjective Symptoms/Limitations Symptoms: S: I shook my sheet lightly last night and I felt my arm pop. I don't know what I did but it did not feel good. Pain Assessment Currently in Pain?: Yes Pain Score:   3 Pain Location: Shoulder Pain Orientation: Right Pain Type: Acute pain  Precautions/Restrictions  Precautions Precautions: Shoulder  Exercise/Treatments Supine Protraction: PROM;Strengthening;15 reps Protraction Weight (lbs): 1 Horizontal ABduction:  (Not completed due to pain ) External Rotation: PROM;Strengthening;15 reps External Rotation Weight (lbs): 1 Internal Rotation: PROM;Strengthening;15 reps Internal Rotation Weight (lbs): 1 Flexion: PROM;Strengthening;15 reps Shoulder Flexion Weight (lbs): 1 ABduction: PROM;Strengthening;15 reps Shoulder ABduction Weight (lbs):  (No weight used due to pain) Seated Elevation: AROM;15 reps Extension: AROM;15 reps Row: AROM;10 reps Protraction: AROM;15 reps Horizontal ABduction Limitations:  (Assist required to support elbow) External Rotation: AROM;15 reps Internal Rotation: AROM;15 reps Flexion: AROM;15 reps Standing External Rotation: Strengthening;Theraband;15 reps Theraband Level (Shoulder External Rotation): Level 3 (Green) Internal Rotation: Strengthening;Theraband;15 reps Theraband Level (Shoulder Internal Rotation): Level 3 (Green) Flexion: Strengthening;Theraband;15 reps Theraband Level (Shoulder Flexion): Level 3 (Green) Extension: Strengthening;Theraband;15 reps Theraband Level (Shoulder Extension): Level 3 (Green) Row:  Strengthening;Theraband;15 reps Theraband Level (Shoulder Row): Level 3 (Green) Retraction: Strengthening;Theraband;15 reps Theraband Level (Shoulder Retraction): Level 3 (Green) ROM / Strengthening / Isometric Strengthening UBE (Upper Arm Bike): 3' and 3' 2.0 Wall Wash: 3' "W" Arms: 10X X to V Arms: 10X Proximal Shoulder Strengthening, Seated: 4 sets 10X      Manual Therapy Manual Therapy: Myofascial release Myofascial Release: MFR and manual stretching to left upper arm, scapular, and shoulder region, and elbow region to decrease pain and fascial restrictions and increase pain free mobility to [facility] within parameters of protocol.   Occupational Therapy Assessment and Plan OT Assessment and Plan Clinical Impression Statement: A; Min physical assist needed for elbow support during horizontal ABD. Some pain noted during ABD supine and Horizontal ABD so we did not complete this date.  OT Frequency: Min 2X/week OT Duration: 4 weeks OT Plan: P: Increase UBE to 2.5 resisitance.   Goals Short Term Goals Time to Complete Short Term Goals: 6 weeks Short Term Goal 1: Patient will be educated on a HEP. Short Term Goal 2: Patient will increase PROM to [facility] in her right shoulder region for increased I with dressing and bathing activities.  Short Term Goal 3: Patient will increase right shoulder strength to 3+/5 for increased ability to lift pots and pans at work. Short Term Goal 4: Patient will decrease fascial restrictions to min-mod in her right shoulder region for increased ability to complete daily activities.  Short Term Goal 5: Patient will decrease pain level to 5/10 in her right shoulder when she is combing her hair. Long Term Goals Time to Complete Long Term Goals: 12 weeks Long Term Goal 1: Patient will return to prior level of independence with all B/IADLs, work, leisure activities and improve her score on the UEFI to 85% or better. Long Term Goal 1 Progress: Progressing toward  goal Long Term Goal 2: Patient will increase right shoulder AROM to [facility] for increased ability to reach into overhead cabinets in her kitchen.  Long Term Goal 2 Progress: Progressing toward goal Long Term Goal 3: Patient will increase right shoulder strength to 4+/5 or better for increased ability to lift pots and pans at work. Long Term Goal 3 Progress: Progressing toward goal Long Term Goal 4: Patient will decrease pain level to 2/10 in her right shoulder when combing her hair. Long Term Goal 5: Patient will decrease fascial restrictions to minimal in her right shoulder region for increased mobility needed for ADL completion.   Problem List Patient Active Problem List  Diagnosis  . DIAB W/UNSPEC COMP TYPE II/UNSPEC TYPE UNCNTRL  . OBESITY-MORBID (>100')  . ESOPHAGEAL REFLUX  . EDEMA  . Chest pain  . Essential hypertension, benign  . Shoulder arthritis  . Rotator cuff rupture, complete  . Pain in joint, shoulder region  . Muscle weakness (generalized)  . S/P complete repair of rotator cuff    End of Session Activity Tolerance: Patient tolerated treatment well General Behavior During Session: [facility] for tasks performed Cognition: [facility] for tasks performed   Ailene Ravel, OTR/L  09/04/2012, 11:11 AM

## 2012-09-06 ENCOUNTER — Ambulatory Visit (HOSPITAL_COMMUNITY)
Admission: RE | Admit: 2012-09-06 | Discharge: 2012-09-06 | Disposition: A | Discharge status: Home / Self Care | Payer: Medicare Other | Source: Ambulatory Visit | Attending: Orthopedic Surgery | Admitting: Orthopedic Surgery

## 2012-09-06 DIAGNOSIS — M25511 Pain in right shoulder: Secondary | ICD-10-CM

## 2012-09-06 DIAGNOSIS — M6281 Muscle weakness (generalized): Secondary | ICD-10-CM

## 2012-09-06 NOTE — Progress Notes (Signed)
Occupational Therapy Treatment Patient Details  Name: [name] [name] MRN: [id number] Date of Birth: 07/12/1955  Today's Date: 09/06/2012 Time: 5732-2025 OT Time Calculation (min): 44 min Manual Therapy [phone] 21' Therapeutic Exercise 1003-1025 22'  Visit#: 20 of 24  Re-eval: 09/13/12    Authorization: [employer] Medicare   Authorization Time Period: before 25th visit  Authorization Visit#: 20 of 25  Subjective Symptoms/Limitations Symptoms: S:  I'm hurting a little today but it may be the weather. Pain Assessment Pain Score:   2 Pain Location: Shoulder Pain Orientation: Right Pain Type: Acute pain  Precautions/Restrictions     Exercise/Treatments Supine Protraction: PROM;Strengthening;15 reps Protraction Weight (lbs): 1 Horizontal ABduction: PROM;Strengthening;15 reps Horizontal ABduction Weight (lbs): 1 External Rotation: PROM;Strengthening;15 reps External Rotation Weight (lbs): 1 Internal Rotation: PROM;Strengthening;15 reps Internal Rotation Weight (lbs): 1 Flexion: PROM;Strengthening;15 reps Shoulder Flexion Weight (lbs): 1 ABduction: PROM;Strengthening;15 reps Shoulder ABduction Weight (lbs): 1 Seated Protraction: Strengthening;10 reps Protraction Weight (lbs): 1 Horizontal ABduction: Strengthening;10 reps Horizontal ABduction Weight (lbs): 1 External Rotation: Strengthening;10 reps External Rotation Weight (lbs): 1 Internal Rotation: Strengthening;10 reps Internal Rotation Weight (lbs): 1 Flexion: Strengthening;10 reps Flexion Weight (lbs): 1 Abduction: AROM;10 reps;Other (comment) (no weight secondary to pain) Standing External Rotation: Strengthening;Theraband;15 reps Theraband Level (Shoulder External Rotation): Level 3 (Green) Internal Rotation: Strengthening;Theraband;15 reps Theraband Level (Shoulder Internal Rotation): Level 3 (Green) Flexion: Strengthening;Theraband;15 reps Theraband Level (Shoulder Flexion): Level 3 (Green) Extension:  Strengthening;Theraband;15 reps Theraband Level (Shoulder Extension): Level 3 (Green) Row: Strengthening;Theraband;15 reps Theraband Level (Shoulder Row): Level 3 (Green) Retraction: Strengthening;Theraband;15 reps Theraband Level (Shoulder Retraction): Level 3 (Green) ROM / Strengthening / Isometric Strengthening UBE (Upper Arm Bike): 3' and 3' 2.5 Wall Wash: 2' with 1/2lb weight Thumb Tacks: 0 "W" Arms: 10X X to V Arms: 10X Proximal Shoulder Strengthening, Seated: 2 sets of 15         Manual Therapy Manual Therapy: Myofascial release Myofascial Release: MFR and scar release to right upper arm, anterior shoulder, posterior shoulder, and scapular region to decrease pain and restrictions and incrase pain free mobility in her RUE, within constraints of protocol.   Occupational Therapy Assessment and Plan OT Assessment and Plan Clinical Impression Statement: A:  Resumed weighted ex in supine, added 1# to seated ex except for abd, wt. held secondary to pain, added 1/2 lb to wall wash which did not increase pain but did fatigue patient. OT Plan: Reassess end of next week.   Goals Short Term Goals Time to Complete Short Term Goals: 6 weeks Short Term Goal 1: Patient will be educated on a HEP. Short Term Goal 2: Patient will increase PROM to [facility] in her right shoulder region for increased I with dressing and bathing activities.  Short Term Goal 3: Patient will increase right shoulder strength to 3+/5 for increased ability to lift pots and pans at work. Short Term Goal 4: Patient will decrease fascial restrictions to min-mod in her right shoulder region for increased ability to complete daily activities.  Short Term Goal 5: Patient will decrease pain level to 5/10 in her right shoulder when she is combing her hair. Long Term Goals Time to Complete Long Term Goals: 12 weeks Long Term Goal 1: Patient will return to prior level of independence with all B/IADLs, work, leisure activities and  improve her score on the UEFI to 85% or better. Long Term Goal 2: Patient will increase right shoulder AROM to [facility] for increased ability to reach into overhead cabinets in her kitchen. Long Term Goal 3:  Patient will increase right shoulder strength to 4+/5 or better for increased ability to lift pots and pans at work. Long Term Goal 4: Patient will decrease pain level to 2/10 in her right shoulder when combing her hair. Long Term Goal 5: Patient will decrease fascial restrictions to minimal in her right shoulder region for increased mobility needed for ADL completion.   Problem List Patient Active Problem List  Diagnosis  . DIAB W/UNSPEC COMP TYPE II/UNSPEC TYPE UNCNTRL  . OBESITY-MORBID (>100')  . ESOPHAGEAL REFLUX  . EDEMA  . Chest pain  . Essential hypertension, benign  . Shoulder arthritis  . Rotator cuff rupture, complete  . Pain in joint, shoulder region  . Muscle weakness (generalized)  . S/P complete repair of rotator cuff    End of Session Activity Tolerance: Patient tolerated treatment well General Behavior During Session: [facility] for tasks performed Cognition: [facility] for tasks performed  GO   Hargis Vandyne L. Lunden Stieber, COTA/L 09/06/2012, 10:41 AM

## 2012-09-11 ENCOUNTER — Ambulatory Visit (HOSPITAL_COMMUNITY)
Admission: RE | Admit: 2012-09-11 | Discharge: 2012-09-11 | Disposition: A | Discharge status: Home / Self Care | Payer: Medicare Other | Source: Ambulatory Visit | Attending: Orthopedic Surgery | Admitting: Orthopedic Surgery

## 2012-09-11 DIAGNOSIS — M6281 Muscle weakness (generalized): Secondary | ICD-10-CM

## 2012-09-11 DIAGNOSIS — M25511 Pain in right shoulder: Secondary | ICD-10-CM

## 2012-09-11 NOTE — Progress Notes (Signed)
Occupational Therapy Treatment Patient Details  Name: [name] [name] MRN: [id number] Date of Birth: 1955-06-29  Today's Date: 09/11/2012 Time: 7824-2353 OT Time Calculation (min): 38 min Manual Therapy 6144-3154 18' Therapeutic Exercises 1125-1145 20' Visit#: 21 of 24  Re-eval: 09/13/12    Authorization: [employer] Medicare   Authorization Time Period: before 25th visit  Authorization Visit#: 21 of 25  Subjective Symptoms/Limitations Symptoms: S:  I have had really bad pain at the base of my shoulder blade all the way up the side to the front of my chest.  I cant sleep or anything.   Limitations: See scanned protocol for full details.  Pain Assessment Currently in Pain?: Yes Pain Score:   4 Pain Location: Shoulder Pain Orientation: Right Pain Type: Acute pain  Precautions/Restrictions    See scanned protocol for full details.    Exercise/Treatments Supine Protraction: PROM;10 reps;AROM;15 reps Horizontal ABduction: PROM;10 reps;AROM;15 reps External Rotation: PROM;10 reps;AROM;15 reps Internal Rotation: PROM;10 reps;AROM;15 reps Flexion: PROM;10 reps;AROM;15 reps ABduction: PROM;10 reps;AROM;15 reps Seated Protraction: AAROM;10 reps Horizontal ABduction: AROM;10 reps Horizontal ABduction Limitations: OTR/L provided input at shoulder blade to maintain depressed position External Rotation: AROM;10 reps Internal Rotation: AROM;10 reps Flexion: AROM;10 reps Flexion Limitations: facilitation to achieve full endrange with first 5 reps  Abduction: AROM;10 reps ROM / Strengthening / Isometric Strengthening UBE (Upper Arm Bike): 3' and 3' 2.5 Wall Wash: omit today "W" Arms: 10X X to V Arms: 10X Proximal Shoulder Strengthening, Seated: 2 sets of 15      Manual Therapy Manual Therapy: Myofascial release Myofascial Release: Myofascial release and scar release to right upper arm, anterior shoulder, posterior, shoulder, and scapular region to decrease pain and restrictions and  increase pain free mobility in her RUE.  Occupational Therapy Assessment and Plan OT Assessment and Plan Clinical Impression Statement: A:  Omitted use of weight with supine and seated exercises this date, as patient has had an increase in pain in her right shoulder since her last visit.  Added tband to HEP, no longer completing during treatment session.  Required tactile input to maintain depressed shoulder blade during therapeutic exercises. OT Plan: P:  Reassess, resume strengthening if pain is better, followup on HEP.   Goals Short Term Goals Time to Complete Short Term Goals: 6 weeks Short Term Goal 1: Patient will be educated on a HEP. Short Term Goal 2: Patient will increase PROM to [name] in her right shoulder region for increased I with dressing and bathing activities.  Short Term Goal 3: Patient will increase right shoulder strength to 3+/5 for increased ability to lift pots and pans at work. Short Term Goal 4: Patient will decrease fascial restrictions to min-mod in her right shoulder region for increased ability to complete daily activities.  Short Term Goal 5: Patient will decrease pain level to 5/10 in her right shoulder when she is combing her hair. Long Term Goals Time to Complete Long Term Goals: 12 weeks Long Term Goal 1: Patient will return to prior level of independence with all B/IADLs, work, leisure activities and improve her score on the UEFI to 85% or better. Long Term Goal 1 Progress: Progressing toward goal Long Term Goal 2: Patient will increase right shoulder AROM to [facility] for increased ability to reach into overhead cabinets in her kitchen. Long Term Goal 2 Progress: Progressing toward goal Long Term Goal 3: Patient will increase right shoulder strength to 4+/5 or better for increased ability to lift pots and pans at work. Long Term Goal 3  Progress: Progressing toward goal Long Term Goal 4: Patient will decrease pain level to 2/10 in her right shoulder when combing her  hair. Long Term Goal 4 Progress: Met Long Term Goal 5: Patient will decrease fascial restrictions to minimal in her right shoulder region for increased mobility needed for ADL completion.   Problem List Patient Active Problem List  Diagnosis  . DIAB W/UNSPEC COMP TYPE II/UNSPEC TYPE UNCNTRL  . OBESITY-MORBID (>100')  . ESOPHAGEAL REFLUX  . EDEMA  . Chest pain  . Essential hypertension, benign  . Shoulder arthritis  . Rotator cuff rupture, complete  . Pain in joint, shoulder region  . Muscle weakness (generalized)  . S/P complete repair of rotator cuff    End of Session Activity Tolerance: Patient tolerated treatment well General Behavior During Session: [facility] for tasks performed OT Plan of Care OT Patient Instructions: discussed heat for pain relief - patient states she sits near the heat, I recommended use of heating pad for direct deep heat.   [place], OTR/L  09/11/2012, 11:47 AM

## 2012-09-13 ENCOUNTER — Ambulatory Visit (HOSPITAL_COMMUNITY)
Admission: RE | Admit: 2012-09-13 | Discharge: 2012-09-13 | Disposition: A | Discharge status: Home / Self Care | Payer: Medicare Other | Source: Ambulatory Visit | Attending: Orthopedic Surgery | Admitting: Orthopedic Surgery

## 2012-09-13 DIAGNOSIS — M6281 Muscle weakness (generalized): Secondary | ICD-10-CM

## 2012-09-13 DIAGNOSIS — M25511 Pain in right shoulder: Secondary | ICD-10-CM

## 2012-09-13 NOTE — Progress Notes (Signed)
Occupational Therapy Treatment Patient Details  Name: [name] [name] MRN: [id number] Date of Birth: 09-04-1955  Today's Date: 09/13/2012 Time: 4665-9935 OT Time Calculation (min): 39 min Manual Therapy 7017-7939 10' Reassessment 0300-9233 13' Therapeutic Exercises [id number] 17' Visit#: 22 of 32  Re-eval: 10/11/12    Authorization: BCBS Medicare   Authorization Time Period: before 32 visit  Authorization Visit#: 22 of 32  Subjective S:  I can lift things and move things a little bit easier, I can't reach overhead with anything in my hand. Limitations: See scanned protocol for full details.  Special Tests: UEFI was 92% and is currently is 78% I level. Pain Assessment Currently in Pain?: Yes Pain Score:   3 Pain Location: Shoulder Pain Orientation: Right Pain Type: Acute pain  Precautions/Restrictions   progress as tolerated  Exercise/Treatments Supine Protraction: PROM;10 reps Seated Protraction: AROM;15 reps Horizontal ABduction: AROM;15 reps External Rotation: AROM;15 reps Internal Rotation: AROM;15 reps Flexion: AROM;15 reps Abduction: AROM;15 reps ROM / Strengthening / Isometric Strengthening UBE (Upper Arm Bike): 3' and 3' 2.5 "W" Arms: 10X X to V Arms: 10X Proximal Shoulder Strengthening, Seated: 2 sets of 15 Ball on Wall: 1'      Manual Therapy Manual Therapy: Myofascial release Myofascial Release: Massage and scar release to right upper arm, anterior shoulder, posterior shoulder, and scapular region to decrease pain and restrictions and incrase pain free mobility in her RUE, within constraints of protocol  Occupational Therapy Assessment and Plan OT Assessment and Plan Clinical Impression Statement: A:  Reassessement:  seated shoulder flexion 150 4/5 (145 3+/5), abduction 145 4-/5 (145 3+/5), external rotation with shoulder abducted 68 4/5 (55 4-/5), internal rotation with shoulder abducted 84 4/5 (74 4-/5).  Patient has met all short term and 2/5 long  term goals.  OT Frequency: Min 2X/week OT Duration: 4 weeks OT Plan: P:  Resume strengthening add cybex press and row.   Goals Short Term Goals Time to Complete Short Term Goals: 6 weeks Short Term Goal 1: Patient will be educated on a HEP. Short Term Goal 2: Patient will increase PROM to [facility] in her right shoulder region for increased I with dressing and bathing activities.  Short Term Goal 3: Patient will increase right shoulder strength to 3+/5 for increased ability to lift pots and pans at work. Short Term Goal 4: Patient will decrease fascial restrictions to min-mod in her right shoulder region for increased ability to complete daily activities.  Short Term Goal 5: Patient will decrease pain level to 5/10 in her right shoulder when she is combing her hair. Long Term Goals Time to Complete Long Term Goals: 12 weeks Long Term Goal 1: Patient will return to prior level of independence with all B/IADLs, work, leisure activities and improve her score on the UEFI to 85% or better. Long Term Goal 1 Progress: Progressing toward goal Long Term Goal 2: Patient will increase right shoulder AROM to [facility] for increased ability to reach into overhead cabinets in her kitchen. Long Term Goal 2 Progress: Progressing toward goal Long Term Goal 3: Patient will increase right shoulder strength to 4+/5 or better for increased ability to lift pots and pans at work. Long Term Goal 3 Progress: Progressing toward goal Long Term Goal 4: Patient will decrease pain level to 2/10 in her right shoulder when combing her hair. Long Term Goal 4 Progress: Met Long Term Goal 5: Patient will decrease fascial restrictions to minimal in her right shoulder region for increased mobility needed for ADL completion.  Long Term Goal 5 Progress: Met  Problem List Patient Active Problem List  Diagnosis  . DIAB W/UNSPEC COMP TYPE II/UNSPEC TYPE UNCNTRL  . OBESITY-MORBID (>100')  . ESOPHAGEAL REFLUX  . EDEMA  . Chest pain  .  Essential hypertension, benign  . Shoulder arthritis  . Rotator cuff rupture, complete  . Pain in joint, shoulder region  . Muscle weakness (generalized)  . S/P complete repair of rotator cuff    End of Session Activity Tolerance: Patient tolerated treatment well General Behavior During Session: [facility] for tasks performed Cognition: [facility] for tasks performed  GO Functional Assessment Tool Used: UEFI scored 78% I level and 22% impairment level Functional Limitation: Self care Self Care Current Status (I3539): At least 20 percent but less than 40 percent impaired, limited or restricted Self Care Goal Status (N2258): 0 percent impaired, limited or restricted  Vangie Bicker, OTR/L  09/13/2012, 11:00 AM

## 2012-09-18 ENCOUNTER — Ambulatory Visit (HOSPITAL_COMMUNITY)
Admission: RE | Admit: 2012-09-18 | Discharge: 2012-09-18 | Disposition: A | Discharge status: Home / Self Care | Payer: Medicare Other | Source: Ambulatory Visit | Attending: Orthopedic Surgery | Admitting: Orthopedic Surgery

## 2012-09-18 DIAGNOSIS — IMO0001 Reserved for inherently not codable concepts without codable children: Secondary | ICD-10-CM | POA: Insufficient documentation

## 2012-09-18 DIAGNOSIS — E119 Type 2 diabetes mellitus without complications: Secondary | ICD-10-CM | POA: Insufficient documentation

## 2012-09-18 DIAGNOSIS — M6281 Muscle weakness (generalized): Secondary | ICD-10-CM | POA: Insufficient documentation

## 2012-09-18 DIAGNOSIS — M25519 Pain in unspecified shoulder: Secondary | ICD-10-CM | POA: Insufficient documentation

## 2012-09-18 NOTE — Progress Notes (Signed)
Occupational Therapy Treatment Patient Details  Name: [name] [name] MRN: [id number] Date of Birth: Apr 18, 1956  Today's Date: 09/18/2012 Time: 1020-1106 OT Time Calculation (min): 46 min Manual Therapy 1020-1040 20' Therapeutic Exercise 1041-1106 25'  Visit#: 23 of 32  Re-eval: 10/11/12    Authorization: BCBS Medicare   Authorization Time Period: before 32 visit  Authorization Visit#: 23 of 32  Subjective Symptoms/Limitations Symptoms: S:  I have that one place in the back that still hurts. Limitations: See scanned protocol for full details.  Pain Assessment Currently in Pain?: Yes Pain Score:   3 Pain Location: Shoulder Pain Orientation: Right  Precautions/Restrictions     Exercise/Treatments Supine Protraction: PROM;10 reps;AROM;15 reps Horizontal ABduction: PROM;10 reps;AROM;15 reps External Rotation: PROM;10 reps;AROM;15 reps Internal Rotation: PROM;10 reps;AROM;15 reps Flexion: PROM;10 reps;AROM;15 reps ABduction: PROM;10 reps;AROM;15 reps Seated Protraction: AROM;20 reps Horizontal ABduction: AROM;20 reps External Rotation: AROM;20 reps Internal Rotation: AROM;20 reps Flexion: AROM;20 reps (with assist for form and full AROM) Abduction: AROM;20 reps;Other (comment) (with assist for form and full AROM) Standing External Rotation: Other (comment) (d/c tband) ROM / Strengthening / Isometric Strengthening Wall Wash: 2' with 1/2lb weight "W" Arms: 12x X to V Arms: 12x Proximal Shoulder Strengthening, Seated: 2 sets of 15 Ball on Wall: 1'  Manual Therapy Manual Therapy: Myofascial release Myofascial Release: Myofascial release and manual stretching and scar massage to right upper arm, anterior shoulder, posterior shoulder and scapular region to decrease pain and restrictions and increase pain free mibility in her RUE within constraints of protocol.  Occupational Therapy Assessment and Plan OT Assessment and Plan Clinical Impression Statement: A: Increased  reps with supine and seated ex secondary to pain from weight. Tactile assist given with flexion and abduction for good form and to complete full AROM Rehab Potential: Good OT Plan: P:  Resume strengthening add cybex press and row.   Goals Short Term Goals Time to Complete Short Term Goals: 6 weeks Short Term Goal 1: Patient will be educated on a HEP. Short Term Goal 2: Patient will increase PROM to [facility] in her right shoulder region for increased I with dressing and bathing activities.  Short Term Goal 3: Patient will increase right shoulder strength to 3+/5 for increased ability to lift pots and pans at work. Short Term Goal 4: Patient will decrease fascial restrictions to min-mod in her right shoulder region for increased ability to complete daily activities.  Short Term Goal 5: Patient will decrease pain level to 5/10 in her right shoulder when she is combing her hair. Long Term Goals Time to Complete Long Term Goals: 12 weeks Long Term Goal 1: Patient will return to prior level of independence with all B/IADLs, work, leisure activities and improve her score on the UEFI to 85% or better. Long Term Goal 2: Patient will increase right shoulder AROM to [facility] for increased ability to reach into overhead cabinets in her kitchen. Long Term Goal 3: Patient will increase right shoulder strength to 4+/5 or better for increased ability to lift pots and pans at work. Long Term Goal 4: Patient will decrease pain level to 2/10 in her right shoulder when combing her hair. Long Term Goal 5: Patient will decrease fascial restrictions to minimal in her right shoulder region for increased mobility needed for ADL completion.   Problem List Patient Active Problem List  Diagnosis  . DIAB W/UNSPEC COMP TYPE II/UNSPEC TYPE UNCNTRL  . OBESITY-MORBID (>100')  . ESOPHAGEAL REFLUX  . EDEMA  . Chest pain  . Essential  hypertension, benign  . Shoulder arthritis  . Rotator cuff rupture, complete  . Pain in joint,  shoulder region  . Muscle weakness (generalized)  . S/P complete repair of rotator cuff    End of Session Activity Tolerance: Patient tolerated treatment well General Behavior During Session: [facility] for tasks performed Cognition: [facility] for tasks performed  GO    Thera Flake, Naleyah Ohlinger L 09/18/2012, 11:06 AM

## 2012-09-20 ENCOUNTER — Ambulatory Visit (HOSPITAL_COMMUNITY)
Admission: RE | Admit: 2012-09-20 | Discharge: 2012-09-20 | Disposition: A | Discharge status: Home / Self Care | Payer: Medicare Other | Source: Ambulatory Visit | Attending: Orthopedic Surgery | Admitting: Orthopedic Surgery

## 2012-09-20 ENCOUNTER — Ambulatory Visit (HOSPITAL_COMMUNITY): Payer: Medicare Other

## 2012-09-20 DIAGNOSIS — M6281 Muscle weakness (generalized): Secondary | ICD-10-CM

## 2012-09-20 DIAGNOSIS — M25511 Pain in right shoulder: Secondary | ICD-10-CM

## 2012-09-20 NOTE — Progress Notes (Signed)
Occupational Therapy Treatment Patient Details  Name: [name] [name] MRN: [id number] Date of Birth: 1955/12/06  Today's Date: 09/20/2012 Time: 1100-1037 OT Time Calculation (min): 1417 min Manual Therapy 1497-0263 12' Therapeutic Exercise 7858-8502 24'  Visit#: 24 of 32  Re-eval: 10/11/12    Authorization: BCBS Medicare   Authorization Time Period: before 32 visit  Authorization Visit#: 24 of 32  Subjective Symptoms/Limitations Symptoms: S:  I went on a field trip and did a lot of walking, I am worn out. Limitations: See scanned protocol for full details.  Pain Assessment Currently in Pain?: Yes Pain Score:   4 Pain Location: Shoulder Pain Orientation: Right  Precautions/Restrictions     Exercise/Treatments Supine Protraction: PROM;10 reps;AROM;15 reps Horizontal ABduction: PROM;10 reps;AROM;15 reps External Rotation: PROM;10 reps;AROM;15 reps Internal Rotation: PROM;10 reps;AROM;15 reps Flexion: PROM;10 reps;AROM;15 reps ABduction: PROM;10 reps;AROM;15 reps Seated Protraction: AROM;20 reps Horizontal ABduction: AROM;20 reps External Rotation: AROM;20 reps Internal Rotation: AROM;20 reps Flexion: AROM;20 reps Abduction: AROM;20 reps;Other (comment) ROM / Strengthening / Isometric Strengthening UBE (Upper Arm Bike): 3' and 3' 2.5 Cybex Press: 1 plate;15 reps Cybex Row: 1 plate;15 reps Wall Wash: 2' with 1# "W" Arms: 12x X to V Arms: 12x Proximal Shoulder Strengthening, Seated: 2 sets of 15 Ball on Wall: 1'        Manual Therapy Manual Therapy: Myofascial release Myofascial Release: Myofascial release and manual stretching to right upper arm, upper trapezius, and lateral and medial border of scapula with focus to decrease restrictions to decrease pain to allow for increase AROM  Occupational Therapy Assessment and Plan OT Assessment and Plan Clinical Impression Statement: A;  Added cybex press and row today.  Decreased cues needed for form OT Plan: P:   Increase reps with strengthening and attempt 1# in supine.   Goals Short Term Goals Time to Complete Short Term Goals: 6 weeks Short Term Goal 1: Patient will be educated on a HEP. Short Term Goal 2: Patient will increase PROM to [facility] in her right shoulder region for increased I with dressing and bathing activities.  Short Term Goal 3: Patient will increase right shoulder strength to 3+/5 for increased ability to lift pots and pans at work. Short Term Goal 4: Patient will decrease fascial restrictions to min-mod in her right shoulder region for increased ability to complete daily activities.  Short Term Goal 5: Patient will decrease pain level to 5/10 in her right shoulder when she is combing her hair. Long Term Goals Time to Complete Long Term Goals: 12 weeks Long Term Goal 1: Patient will return to prior level of independence with all B/IADLs, work, leisure activities and improve her score on the UEFI to 85% or better. Long Term Goal 2: Patient will increase right shoulder AROM to [facility] for increased ability to reach into overhead cabinets in her kitchen. Long Term Goal 3: Patient will increase right shoulder strength to 4+/5 or better for increased ability to lift pots and pans at work. Long Term Goal 4: Patient will decrease pain level to 2/10 in her right shoulder when combing her hair. Long Term Goal 5: Patient will decrease fascial restrictions to minimal in her right shoulder region for increased mobility needed for ADL completion.   Problem List Patient Active Problem List  Diagnosis  . DIAB W/UNSPEC COMP TYPE II/UNSPEC TYPE UNCNTRL  . OBESITY-MORBID (>100')  . ESOPHAGEAL REFLUX  . EDEMA  . Chest pain  . Essential hypertension, benign  . Shoulder arthritis  . Rotator cuff rupture, complete  .  Pain in joint, shoulder region  . Muscle weakness (generalized)  . S/P complete repair of rotator cuff    End of Session Activity Tolerance: Patient tolerated treatment  well General Behavior During Session: [facility] for tasks performed Cognition: [facility] for tasks performed  GO   Kamsiyochukwu Spickler L. Abdiaziz Klahn, COTA/L 09/20/2012, 11:44 AM

## 2012-09-25 ENCOUNTER — Ambulatory Visit (HOSPITAL_COMMUNITY)
Admission: RE | Admit: 2012-09-25 | Discharge: 2012-09-25 | Disposition: A | Discharge status: Home / Self Care | Payer: Medicare Other | Source: Ambulatory Visit | Attending: Orthopedic Surgery | Admitting: Orthopedic Surgery

## 2012-09-25 ENCOUNTER — Ambulatory Visit (HOSPITAL_COMMUNITY): Payer: Medicare Other

## 2012-09-25 NOTE — Progress Notes (Signed)
Occupational Therapy Treatment Patient Details  Name: [name] [name] MRN: [id number] Date of Birth: July 02, 1955  Today's Date: 09/25/2012 Time: 7408-1448 OT Time Calculation (min): 44 min Manual Therapy 1856-3149 17' Therapeutic Exercise 7026-3785 26'  Visit#: 25 of 32  Re-eval: 10/11/12    Authorization: BCBS Medicare   Authorization Time Period: before 32 visit  Authorization Visit#: 25 of 32  Subjective Symptoms/Limitations Symptoms: S:  My shoulder is still chugging along. Limitations: See scanned protocol for full details.  Pain Assessment Currently in Pain?: Yes Pain Score:   3 Pain Location: Shoulder Pain Orientation: Right Pain Type: Acute pain  Precautions/Restrictions     Exercise/Treatments Supine Protraction: PROM;Strengthening;10 reps Protraction Weight (lbs): 1 Horizontal ABduction: PROM;Strengthening;10 reps Horizontal ABduction Weight (lbs): 1 External Rotation: PROM;Strengthening;10 reps External Rotation Weight (lbs): 1 Internal Rotation: PROM;Strengthening;10 reps Internal Rotation Weight (lbs): 1 Flexion: PROM;Strengthening;10 reps Shoulder Flexion Weight (lbs): 1 ABduction: PROM;Strengthening;10 reps Shoulder ABduction Weight (lbs): 1 Seated Protraction: AROM;20 reps Horizontal ABduction: AROM;20 reps External Rotation: AROM;20 reps Internal Rotation: AROM;20 reps Flexion: AROM;20 reps Abduction: AROM;20 reps ROM / Strengthening / Isometric Strengthening UBE (Upper Arm Bike): 3' and 3' 2.5 Cybex Press: 1 plate;20 reps Cybex Row: 1 plate;20 reps Wall Wash: 3' with 1# "W" Arms: 12x X to V Arms: 12x Proximal Shoulder Strengthening, Seated: 1 x 15 with 1# and max verbal and tactile cues for form. Ball on Wall: 1' flexion and 1' abduction with green weighted ball.      Manual Therapy Manual Therapy: Myofascial release Myofascial Release: Myofascial release and manual stretching to right upper arm, upper trapezius, and lateral and medial  border of scapula with focus to decrease restrictions to decrease pain to allow for increase AROMtaken   Occupational Therapy Assessment and Plan OT Assessment and Plan Clinical Impression Statement: A:  Increased supine resistance with 1# to increase strength.  Resumed ball on wall with cues for good form.  Increased wall wash to 3# which fatigued patient but did not increase pain. OT Plan: P:  Increase cybex to 2 plates.   Goals Short Term Goals Time to Complete Short Term Goals: 6 weeks Short Term Goal 1: Patient will be educated on a HEP. Short Term Goal 2: Patient will increase PROM to [facility] in her right shoulder region for increased I with dressing and bathing activities.  Short Term Goal 3: Patient will increase right shoulder strength to 3+/5 for increased ability to lift pots and pans at work. Short Term Goal 4: Patient will decrease fascial restrictions to min-mod in her right shoulder region for increased ability to complete daily activities.  Short Term Goal 5: Patient will decrease pain level to 5/10 in her right shoulder when she is combing her hair. Long Term Goals Time to Complete Long Term Goals: 12 weeks Long Term Goal 1: Patient will return to prior level of independence with all B/IADLs, work, leisure activities and improve her score on the UEFI to 85% or better. Long Term Goal 2: Patient will increase right shoulder AROM to [facility] for increased ability to reach into overhead cabinets in her kitchen. Long Term Goal 3: Patient will increase right shoulder strength to 4+/5 or better for increased ability to lift pots and pans at work. Long Term Goal 4: Patient will decrease pain level to 2/10 in her right shoulder when combing her hair. Long Term Goal 5: Patient will decrease fascial restrictions to minimal in her right shoulder region for increased mobility needed for ADL completion.  Problem List Patient Active Problem List  Diagnosis  . DIAB W/UNSPEC COMP TYPE II/UNSPEC  TYPE UNCNTRL  . OBESITY-MORBID (>100')  . ESOPHAGEAL REFLUX  . EDEMA  . Chest pain  . Essential hypertension, benign  . Shoulder arthritis  . Rotator cuff rupture, complete  . Pain in joint, shoulder region  . Muscle weakness (generalized)  . S/P complete repair of rotator cuff    End of Session Activity Tolerance: Patient tolerated treatment well General Behavior During Session: [facility] for tasks performed Cognition: [facility] for tasks performed  GO   Isabella Roemmich L. Kolina Kube, COTA/L 09/25/2012, 11:46 AM

## 2012-09-27 ENCOUNTER — Ambulatory Visit (HOSPITAL_COMMUNITY): Payer: Medicare Other | Admitting: Occupational Therapy

## 2012-09-27 ENCOUNTER — Ambulatory Visit (HOSPITAL_COMMUNITY): Payer: Medicare Other

## 2012-10-02 ENCOUNTER — Ambulatory Visit (HOSPITAL_COMMUNITY)
Admission: RE | Admit: 2012-10-02 | Discharge: 2012-10-02 | Disposition: A | Discharge status: Home / Self Care | Payer: Medicare Other | Source: Ambulatory Visit | Attending: Orthopedic Surgery | Admitting: Orthopedic Surgery

## 2012-10-02 DIAGNOSIS — M6281 Muscle weakness (generalized): Secondary | ICD-10-CM

## 2012-10-02 DIAGNOSIS — M25511 Pain in right shoulder: Secondary | ICD-10-CM

## 2012-10-02 NOTE — Progress Notes (Signed)
Occupational Therapy Treatment Patient Details  Name: [name] [name] MRN: [id number] Date of Birth: December 09, 1955  Today's Date: 10/02/2012 Time: 9326-7124 OT Time Calculation (min): 35 min Manual Therapy 233-247  Therapeutic Exercise 248-308 20'  Visit#: 26 of 32  Re-eval: 10/11/12    Authorization: BCBS Medicare   Authorization Time Period: before 32 visit  Authorization Visit#: 26 of 32  Subjective Symptoms/Limitations Symptoms: S:  It is a little better but still gives me some pain every now and then. Limitations: See scanned protocol for full details.   Precautions/Restrictions     Exercise/Treatments Supine Protraction: PROM;Strengthening;12 reps Protraction Weight (lbs): 1 Horizontal ABduction: PROM;Strengthening;12 reps Horizontal ABduction Weight (lbs): 1 External Rotation: PROM;Strengthening;12 reps External Rotation Weight (lbs): 1 Internal Rotation: PROM;Strengthening;12 reps Internal Rotation Weight (lbs): 1 Flexion: PROM;Strengthening;12 reps Shoulder Flexion Weight (lbs): 1 ABduction: PROM;Strengthening;12 reps Shoulder ABduction Weight (lbs): 1 Seated Protraction: Strengthening;10 reps Protraction Weight (lbs): 1 Horizontal ABduction: Strengthening;10 reps Horizontal ABduction Weight (lbs): 1 External Rotation: Strengthening;10 reps External Rotation Weight (lbs): 1 Internal Rotation: Strengthening Internal Rotation Weight (lbs): 1 Flexion: Strengthening;10 reps Flexion Weight (lbs): 1 Abduction: Strengthening;10 reps ABduction Weight (lbs): 1 ROM / Strengthening / Isometric Strengthening Cybex Press: 2 plate;15 reps Cybex Row: 2 plate;15 reps Wall Wash: 2' with 1 1/2lbs  "W" Arms: 12x X to V Arms: 12x Proximal Shoulder Strengthening, Seated: x20 without weight, unable to do with weight today       Manual Therapy Manual Therapy: Myofascial release Massage: Massage and scar release to right upper arm, anterior shoulder, posterior shoulder,  and scapular region to decrease pain and restrictions and incrase pain free mobility in her RUE, within constraints of protocol.  Occupational Therapy Assessment and Plan OT Assessment and Plan Clinical Impression Statement: A:  Constant cues to decrease pace with exercises.  Added 1# to seated exercises, unable to use 1# with proximal shoulder strengthening today OT Plan: A:  Attempt 1# with proximal shoulder strengthening    Goals Short Term Goals Time to Complete Short Term Goals: 6 weeks Short Term Goal 1: Patient will be educated on a HEP. Short Term Goal 1 Progress: Met Short Term Goal 2: Patient will increase PROM to [facility] in her right shoulder region for increased I with dressing and bathing activities.  Short Term Goal 2 Progress: Met Short Term Goal 3: Patient will increase right shoulder strength to 3+/5 for increased ability to lift pots and pans at work. Short Term Goal 3 Progress: Met Short Term Goal 4: Patient will decrease fascial restrictions to min-mod in her right shoulder region for increased ability to complete daily activities.  Short Term Goal 4 Progress: Met Short Term Goal 5: Patient will decrease pain level to 5/10 in her right shoulder when she is combing her hair. Short Term Goal 5 Progress: Met Long Term Goals Time to Complete Long Term Goals: 12 weeks Long Term Goal 1: Patient will return to prior level of independence with all B/IADLs, work, leisure activities and improve her score on the UEFI to 85% or better. Long Term Goal 1 Progress: Progressing toward goal Long Term Goal 2: Patient will increase right shoulder AROM to [facility] for increased ability to reach into overhead cabinets in her kitchen. Long Term Goal 2 Progress: Met Long Term Goal 3: Patient will increase right shoulder strength to 4+/5 or better for increased ability to lift pots and pans at work. Long Term Goal 3 Progress: Progressing toward goal Long Term Goal 4: Patient will decrease pain  level  to 2/10 in her right shoulder when combing her hair. Long Term Goal 4 Progress: Met Long Term Goal 5: Patient will decrease fascial restrictions to minimal in her right shoulder region for increased mobility needed for ADL completion.  Long Term Goal 5 Progress: Met  Problem List Patient Active Problem List  Diagnosis  . DIAB W/UNSPEC COMP TYPE II/UNSPEC TYPE UNCNTRL  . OBESITY-MORBID (>100')  . ESOPHAGEAL REFLUX  . EDEMA  . Chest pain  . Essential hypertension, benign  . Shoulder arthritis  . Rotator cuff rupture, complete  . Pain in joint, shoulder region  . Muscle weakness (generalized)  . S/P complete repair of rotator cuff    End of Session Activity Tolerance: Patient tolerated treatment well General Behavior During Session: [facility] for tasks performed Cognition: [facility] for tasks performed  GO   Catie Chiao L. Vanice Rappa, COTA/L 10/02/2012, 3:15 PM

## 2012-10-04 ENCOUNTER — Ambulatory Visit (HOSPITAL_COMMUNITY)
Admission: RE | Admit: 2012-10-04 | Discharge: 2012-10-04 | Disposition: A | Discharge status: Home / Self Care | Payer: Medicare Other | Source: Ambulatory Visit | Attending: Orthopedic Surgery | Admitting: Orthopedic Surgery

## 2012-10-04 DIAGNOSIS — M6281 Muscle weakness (generalized): Secondary | ICD-10-CM

## 2012-10-04 DIAGNOSIS — M25511 Pain in right shoulder: Secondary | ICD-10-CM

## 2012-10-04 NOTE — Progress Notes (Signed)
Occupational Therapy Treatment Patient Details  Name: [name] [name] MRN: [id number] Date of Birth: 1956-01-20  Today's Date: 10/04/2012 Time: 4098-1191 OT Time Calculation (min): 30 min Manual Therapy 1431-1440 9' Therapeutic Exercises 1440-1501 21' Visit#: 5 of 32  Re-eval: 10/11/12    Authorization: BCBS Medicare   Authorization Time Period: before 32 visit  Authorization Visit#: 27 of 32  Subjective S:  Its getting better. Limitations: Pain Assessment Currently in Pain?: Yes Pain Score:   3 Pain Location: Shoulder Pain Orientation: Right Pain Type: Acute pain  Precautions/Restrictions    See scanned protocol for full details.    Exercise/Treatments Supine Protraction: PROM;Strengthening;10 reps Protraction Weight (lbs): 2 Horizontal ABduction: PROM;Strengthening;10 reps Horizontal ABduction Weight (lbs): 2 External Rotation: PROM;Strengthening;10 reps External Rotation Weight (lbs): 2 Internal Rotation: PROM;Strengthening;10 reps Internal Rotation Weight (lbs): 2 Flexion: PROM;Strengthening;10 reps Shoulder Flexion Weight (lbs): 2 ABduction: PROM;Strengthening;10 reps Shoulder ABduction Weight (lbs): 2 Seated Protraction: Strengthening;12 reps Protraction Weight (lbs): 1 Horizontal ABduction: Strengthening;12 reps Horizontal ABduction Weight (lbs): 1 External Rotation: Strengthening;12 reps External Rotation Weight (lbs): 1 Internal Rotation: Strengthening;12 reps Internal Rotation Weight (lbs): 1 Flexion: Strengthening;12 reps Flexion Weight (lbs): 1 Abduction: Strengthening;12 reps ABduction Weight (lbs): 1 ROM / Strengthening / Isometric Strengthening UBE (Upper Arm Bike): 3' and 3' 3.0   Cybex Press: 2 plate;15 reps Cybex Row: 2 plate;15 reps Wall Wash: omit this date. "W" Arms: 10 times with 1 pound X to V Arms: 8 times with 1 pound  Proximal Shoulder Strengthening, Seated: 10 times each without resting, OTR/L held weight at position that  allowed for greater shoulder vs elbow mobility Ball on Wall: 1' flexion and 1' abduction with green weighted ball.      Manual Therapy Manual Therapy: Myofascial release Myofascial Release: Myofascial release and manual stretching to right upper arm, upper trapezius, and lateral and medial border of scapula with focus to decrease restrictions to decrease pain to allow for increase AROM  Occupational Therapy Assessment and Plan OT Assessment and Plan Clinical Impression Statement: A:  Treatment tolerated well.  Increased to 2 pounds with supine strengthening.  Added 1 poudn to proximal shoulder strengthening and needed moderate facilitation from OTR/L for proper technique.   OT Plan: P:  Less faciliation with proximal shoulder strengthening as shoulder strength improves.     Goals Short Term Goals Time to Complete Short Term Goals: 6 weeks Short Term Goal 1: Patient will be educated on a HEP. Short Term Goal 2: Patient will increase PROM to [facility] in her right shoulder region for increased I with dressing and bathing activities.  Short Term Goal 3: Patient will increase right shoulder strength to 3+/5 for increased ability to lift pots and pans at work. Short Term Goal 4: Patient will decrease fascial restrictions to min-mod in her right shoulder region for increased ability to complete daily activities.  Short Term Goal 5: Patient will decrease pain level to 5/10 in her right shoulder when she is combing her hair. Long Term Goals Time to Complete Long Term Goals: 12 weeks Long Term Goal 1: Patient will return to prior level of independence with all B/IADLs, work, leisure activities and improve her score on the UEFI to 85% or better. Long Term Goal 1 Progress: Progressing toward goal Long Term Goal 2: Patient will increase right shoulder AROM to [facility] for increased ability to reach into overhead cabinets in her kitchen. Long Term Goal 3: Patient will increase right shoulder strength to 4+/5 or  better for increased ability  to lift pots and pans at work. Long Term Goal 3 Progress: Progressing toward goal Long Term Goal 4: Patient will decrease pain level to 2/10 in her right shoulder when combing her hair. Long Term Goal 5: Patient will decrease fascial restrictions to minimal in her right shoulder region for increased mobility needed for ADL completion.   Problem List Patient Active Problem List  Diagnosis  . DIAB W/UNSPEC COMP TYPE II/UNSPEC TYPE UNCNTRL  . OBESITY-MORBID (>100')  . ESOPHAGEAL REFLUX  . EDEMA  . Chest pain  . Essential hypertension, benign  . Shoulder arthritis  . Rotator cuff rupture, complete  . Pain in joint, shoulder region  . Muscle weakness (generalized)  . S/P complete repair of rotator cuff    End of Session Activity Tolerance: Patient tolerated treatment well General Behavior During Therapy: WFL for tasks assessed/performed Cognition: WFL for tasks performed  [place], OTR/L  10/04/2012, 2:56 PM

## 2012-10-09 ENCOUNTER — Ambulatory Visit (HOSPITAL_COMMUNITY)
Admission: RE | Admit: 2012-10-09 | Discharge: 2012-10-09 | Disposition: A | Discharge status: Home / Self Care | Payer: Medicare Other | Source: Ambulatory Visit | Attending: Orthopedic Surgery | Admitting: Orthopedic Surgery

## 2012-10-09 DIAGNOSIS — M25511 Pain in right shoulder: Secondary | ICD-10-CM

## 2012-10-09 DIAGNOSIS — M6281 Muscle weakness (generalized): Secondary | ICD-10-CM

## 2012-10-09 NOTE — Progress Notes (Signed)
Occupational Therapy Treatment Patient Details  Name: [name] [name] MRN: [id number] Date of Birth: [birth year]  Today's Date: 10/09/2012 Time: 1353-1430 OT Time Calculation (min): 37 min MFR 9629-5284 13' Therex 1324-4010 24'   Visit#: 28 of 27  Re-eval: 10/11/12    Authorization: BCBS Medicare   Authorization Time Period: before 32 visit  Authorization Visit#: 28 of 32  Subjective Symptoms/Limitations Symptoms: S: I just don't feel good today. Pain Assessment Currently in Pain?: Yes Pain Score:   1 Pain Location: Shoulder Pain Orientation: Right Pain Type: Acute pain  Precautions/Restrictions  Precautions Precautions: Shoulder  Exercise/Treatments Supine Protraction: PROM;Strengthening;10 reps Protraction Weight (lbs): 2 Horizontal ABduction: PROM;Strengthening;10 reps Horizontal ABduction Weight (lbs): 2 External Rotation: PROM;Strengthening;10 reps External Rotation Weight (lbs): 2 Internal Rotation: PROM;Strengthening;10 reps Internal Rotation Weight (lbs): 2 Flexion: PROM;Strengthening;10 reps Shoulder Flexion Weight (lbs): 2 ABduction: PROM;Strengthening;10 reps Shoulder ABduction Weight (lbs): 2 Seated Protraction: Strengthening;12 reps Protraction Weight (lbs): 1 Horizontal ABduction: Strengthening;12 reps Horizontal ABduction Weight (lbs): 1 External Rotation: Strengthening;12 reps External Rotation Weight (lbs): 1 Internal Rotation: Strengthening;12 reps Internal Rotation Weight (lbs): 1 Flexion: Strengthening;12 reps Flexion Weight (lbs): 1 Abduction: Strengthening;12 reps ABduction Weight (lbs): 1 ROM / Strengthening / Isometric Strengthening UBE (Upper Arm Bike): 3' and 3' 3.0   Cybex Press: 2 plate;15 reps Cybex Row: 2 plate;15 reps Wall Wash: 2' with 1.5# "W" Arms: 12 times with 1 pound X to V Arms: 8 times with 1 pound  Proximal Shoulder Strengthening, Seated: 10 times each without resting Ball on Wall: 1' flexion and 1' abduction  with green weighted ball.      Manual Therapy Manual Therapy: Myofascial release Myofascial Release: Myofascial release and manual stretching to right upper arm, upper trapezius, and lateral and medial border of scapula with focus to decrease restrictions to decrease pain to allow for increase AROM  Occupational Therapy Assessment and Plan OT Assessment and Plan Clinical Impression Statement: A: Tolerated all exercises. Needed vc's for form and technique. Still struggling with seated horizontal ABD. OT Plan: P: Cont to work on increase form and technique during UB exercises with less vc's.   Goals Short Term Goals Time to Complete Short Term Goals: 6 weeks Short Term Goal 1: Patient will be educated on a HEP. Short Term Goal 2: Patient will increase PROM to [facility] in her right shoulder region for increased I with dressing and bathing activities.  Short Term Goal 3: Patient will increase right shoulder strength to 3+/5 for increased ability to lift pots and pans at work. Short Term Goal 4: Patient will decrease fascial restrictions to min-mod in her right shoulder region for increased ability to complete daily activities.  Short Term Goal 5: Patient will decrease pain level to 5/10 in her right shoulder when she is combing her hair. Long Term Goals Time to Complete Long Term Goals: 12 weeks Long Term Goal 1: Patient will return to prior level of independence with all B/IADLs, work, leisure activities and improve her score on the UEFI to 85% or better. Long Term Goal 1 Progress: Progressing toward goal Long Term Goal 2: Patient will increase right shoulder AROM to [facility] for increased ability to reach into overhead cabinets in her kitchen. Long Term Goal 3: Patient will increase right shoulder strength to 4+/5 or better for increased ability to lift pots and pans at work. Long Term Goal 3 Progress: Progressing toward goal Long Term Goal 4: Patient will decrease pain level to 2/10 in her right  shoulder when combing  her hair. Long Term Goal 5: Patient will decrease fascial restrictions to minimal in her right shoulder region for increased mobility needed for ADL completion.   Problem List Patient Active Problem List  Diagnosis  . DIAB W/UNSPEC COMP TYPE II/UNSPEC TYPE UNCNTRL  . OBESITY-MORBID (>100')  . ESOPHAGEAL REFLUX  . EDEMA  . Chest pain  . Essential hypertension, benign  . Shoulder arthritis  . Rotator cuff rupture, complete  . Pain in joint, shoulder region  . Muscle weakness (generalized)  . S/P complete repair of rotator cuff       Ailene Ravel, OTR/L,CBIS   10/09/2012, 2:32 PM

## 2012-10-11 ENCOUNTER — Ambulatory Visit (HOSPITAL_COMMUNITY)
Admission: RE | Admit: 2012-10-11 | Discharge: 2012-10-11 | Disposition: A | Discharge status: Home / Self Care | Payer: Medicare Other | Source: Ambulatory Visit | Attending: Orthopedic Surgery | Admitting: Orthopedic Surgery

## 2012-10-11 DIAGNOSIS — M6281 Muscle weakness (generalized): Secondary | ICD-10-CM

## 2012-10-11 DIAGNOSIS — M25511 Pain in right shoulder: Secondary | ICD-10-CM

## 2012-10-11 NOTE — Evaluation (Addendum)
Occupational Therapy Re-Evaluation and Treatment  Patient Details  Name: [name] [name] MRN: [id number] Date of Birth: [birth year]  Today's Date: 10/11/2012 Time: 9390-3009 OT Time Calculation (min): 44 min MFR 2330-0762 23' Reassess 1324-1330 6' Therex 2633-3545 15'  Visit#: 29 of 50  Re-eval: 10/11/12     Authorization: BCBS Medicare   Authorization Time Period: before 64 visit  Authorization Visit#: 27 of 22   Past Medical History:  Past Medical History  Diagnosis Date  . Type 2 diabetes mellitus   . Essential hypertension, benign   . GERD (gastroesophageal reflux disease)   . Edema     feet and legs  . Morbid obesity   . COPD (chronic obstructive pulmonary disease)   . Thyroid disease   . Asthma   . Arthritis   . Depression   . Cirrhosis of liver   . Hypothyroidism 2009    thyroid cancer  . Cancer     thyroid   Past Surgical History:  Past Surgical History  Procedure Laterality Date  . Thyroidectomy  2009  . Cholecystectomy  1996  . Tonsillectomy  1958  . Foot surgery  2000    bone spur removed  . Abdominal hysterectomy  1985  . Exploratory laparotomy  2003  . Umbilical hernia repair  2010  . Debridement of abdominal wound  2011  . Cesarean section  1977  . Cardiac catheterization  2012  . Resection distal clavical  05/07/2012    Procedure: RESECTION DISTAL CLAVICAL;  Surgeon: [name], MD;  Location: [facility];  Service: Orthopedics;  Laterality: Right;  . Shoulder open rotator cuff repair  05/07/2012    Procedure: ROTATOR CUFF REPAIR SHOULDER OPEN;  Surgeon: [name], MD;  Location: [facility];  Service: Orthopedics;  Laterality: Right;    Subjective Symptoms/Limitations Symptoms: S: I'm having the worst day ever with this shoulder. Pain Assessment Currently in Pain?: Yes Pain Score:   4 Pain Location: Shoulder Pain Orientation: Right Pain Type: Acute pain  Precautions/Restrictions  Precautions Precautions:  Shoulder  Assessment Additional Assessments RUE AROM (degrees) RUE Overall AROM Comments: assessed seated. IR/ER ABD Right Shoulder Flexion: 135 Degrees (last progress note: 150) Right Shoulder ABduction: 99 Degrees (Last progress note: 145) Right Shoulder Internal Rotation: 82 Degrees (Last progress note: 68) Right Shoulder External Rotation: 90 Degrees (Last progress note: 84) RUE Strength Right Shoulder Flexion: 4/5 Right Shoulder ABduction:  (4-/5) Right Shoulder Internal Rotation: 4/5 Right Shoulder External Rotation: 4/5 Palpation Palpation: Min fascial restrictions in right shoulder region     Exercise/Treatments Supine Protraction: PROM;10 reps Horizontal ABduction: PROM;10 reps External Rotation: PROM;10 reps Internal Rotation: PROM;10 reps Flexion: PROM;10 reps ABduction: PROM;10 reps Seated Protraction: Strengthening;12 reps Protraction Weight (lbs): 1 Horizontal ABduction: Strengthening;12 reps Horizontal ABduction Weight (lbs): 1 External Rotation: Strengthening;12 reps External Rotation Weight (lbs): 1 Internal Rotation: Strengthening;12 reps Internal Rotation Weight (lbs): 1 Flexion: Strengthening;12 reps Flexion Weight (lbs): 1 Abduction: Strengthening;12 reps ABduction Weight (lbs): 1 ROM / Strengthening / Isometric Strengthening UBE (Upper Arm Bike): 3' and 3' 3.0   Cybex Press: 2 plate;15 reps Cybex Row: 2 plate;15 reps "W" Arms: 12 times with 1 pound X to V Arms: 12X with 1# Proximal Shoulder Strengthening, Seated: 10 times each without resting Ball on Wall: 1' flexion and 1' abduction with green weighted ball.      Manual Therapy Manual Therapy: Myofascial release Myofascial Release: Myofascial release and manual stretching to right upper arm, upper trapezius, and lateral and medial border  of scapula with focus to decrease restrictions to decrease pain to allow for increase AROM  Occupational Therapy Assessment and Plan OT Assessment and  Plan Clinical Impression Statement: A: See MD note for progress. Patient's measurements decreased this date from previous reassessment. Will plan on continuing therapy 2x week for 2 more weeks. OT Plan: P: cont. therapy 2x week for 2 weeks.   Goals Short Term Goals Time to Complete Short Term Goals: 6 weeks Short Term Goal 1: Patient will be educated on a HEP. Short Term Goal 2: Patient will increase PROM to [facility] in her right shoulder region for increased I with dressing and bathing activities.  Short Term Goal 3: Patient will increase right shoulder strength to 3+/5 for increased ability to lift pots and pans at work. Short Term Goal 4: Patient will decrease fascial restrictions to min-mod in her right shoulder region for increased ability to complete daily activities.  Short Term Goal 5: Patient will decrease pain level to 5/10 in her right shoulder when she is combing her hair. Long Term Goals Time to Complete Long Term Goals: 12 weeks Long Term Goal 1: Patient will return to prior level of independence with all B/IADLs, work, leisure activities and improve her score on the UEFI to 85% or better. Long Term Goal 1 Progress: Progressing toward goal Long Term Goal 2: Patient will increase right shoulder AROM to [facility] for increased ability to reach into overhead cabinets in her kitchen. Long Term Goal 3: Patient will increase right shoulder strength to 4+/5 or better for increased ability to lift pots and pans at work. Long Term Goal 3 Progress: Progressing toward goal Long Term Goal 4: Patient will decrease pain level to 2/10 in her right shoulder when combing her hair. Long Term Goal 5: Patient will decrease fascial restrictions to minimal in her right shoulder region for increased mobility needed for ADL completion.   Problem List Patient Active Problem List  Diagnosis  . DIAB W/UNSPEC COMP TYPE II/UNSPEC TYPE UNCNTRL  . OBESITY-MORBID (>100')  . ESOPHAGEAL REFLUX  . EDEMA  . Chest pain   . Essential hypertension, benign  . Shoulder arthritis  . Rotator cuff rupture, complete  . Pain in joint, shoulder region  . Muscle weakness (generalized)  . S/P complete repair of rotator cuff    End of Session Activity Tolerance: Patient tolerated treatment well General Behavior During Therapy: WFL for tasks assessed/performed Cognition: WFL for tasks performed  GO Functional Assessment Tool Used: UEFI scored 63.75%% I level and 36.25% impairment level. Last progress note: 78% indepedent and 22% impaired Functional Limitation: Self care Self Care Current Status ([id number]): At least 20 percent but less than 40 percent impaired, limited or restricted Self Care Goal Status ([id number]): 0 percent impaired, limited or restricted  Ailene Ravel, OTR/L,CBIS   10/11/2012, 4:43 PM  Physician Documentation Your signature is required to indicate approval of the treatment plan as stated above.  Please sign and either send electronically or make a copy of this report for your files and return this physician signed original.  Please mark one 1.__approve of plan  2. ___approve of plan with the following conditions.   ______________________________  _____________________ Physician Signature                                                                                                             Date

## 2012-10-16 ENCOUNTER — Ambulatory Visit (HOSPITAL_COMMUNITY)
Admission: RE | Admit: 2012-10-16 | Discharge: 2012-10-16 | Disposition: A | Discharge status: Home / Self Care | Payer: Medicare Other | Source: Ambulatory Visit | Attending: Orthopedic Surgery | Admitting: Orthopedic Surgery

## 2012-10-16 DIAGNOSIS — M25511 Pain in right shoulder: Secondary | ICD-10-CM

## 2012-10-16 DIAGNOSIS — M6281 Muscle weakness (generalized): Secondary | ICD-10-CM

## 2012-10-16 NOTE — Progress Notes (Signed)
Occupational Therapy Treatment Patient Details  Name: [name] [name] MRN: [id number] Date of Birth: 12-25-1955  Today's Date: 10/16/2012 Time: 2774-1287 OT Time Calculation (min): 47 min MFR 1350-1415 25' Therex 8676-7209 22'  Visit#: 30 of 46  Re-eval: 10/11/12    Authorization: BCBS Medicare   Authorization Time Period: before 32 visit  Authorization Visit#: 30 of 36  Subjective Symptoms/Limitations Symptoms: S: I'm still feeling tight today. Pain Assessment Currently in Pain?: Yes Pain Score:   3 Pain Location: Shoulder Pain Orientation: Right Pain Type: Acute pain  Precautions/Restrictions  Precautions Precautions: Shoulder  Exercise/Treatments Supine Protraction: PROM;Strengthening;10 reps Protraction Weight (lbs): 2 Horizontal ABduction: PROM;Strengthening;10 reps Horizontal ABduction Weight (lbs): 2 External Rotation: PROM;Strengthening;10 reps External Rotation Weight (lbs): 2 Internal Rotation: PROM;Strengthening;10 reps Internal Rotation Weight (lbs): 2 Flexion: PROM;Strengthening;10 reps Shoulder Flexion Weight (lbs): 2 ABduction: PROM;Strengthening;10 reps Shoulder ABduction Weight (lbs): 2 Seated Protraction: Strengthening;12 reps Protraction Weight (lbs): 1 Horizontal ABduction: Strengthening;12 reps Horizontal ABduction Weight (lbs): 1 External Rotation: Strengthening;12 reps External Rotation Weight (lbs): 1 Internal Rotation: Strengthening;12 reps Internal Rotation Weight (lbs): 1 Flexion: Strengthening;12 reps Flexion Weight (lbs): 1 Abduction: Strengthening;12 reps ABduction Weight (lbs): 1 ROM / Strengthening / Isometric Strengthening UBE (Upper Arm Bike): 3' and 3' 3.0   Cybex Press: 2 plate;15 reps Cybex Row: 2 plate;15 reps Wall Wash: 2' with 1.5# "W" Arms: 12X with 1lb X to V Arms: 12X with 1# Proximal Shoulder Strengthening, Seated: 10 times each without resting 1# Ball on Wall: 1' flexion and 1' abduction with green weighted  ball.   Manual Therapy Manual Therapy: Myofascial release Myofascial Release: Myofascial release and manual stretching to right upper arm, upper trapezius, and lateral and medial border of scapula with focus to decrease restrictions to decrease pain to allow for increase AROM  Occupational Therapy Assessment and Plan OT Assessment and Plan Clinical Impression Statement: A: Increased tightness and tenderness in upper arm and lateral region of pectoralis muscle during MFR. OT Plan: P: Cont. to work on increasing AROM to [facility] in a pain free zone.    Goals Short Term Goals Time to Complete Short Term Goals: 6 weeks Short Term Goal 1: Patient will be educated on a HEP. Short Term Goal 2: Patient will increase PROM to [facility] in her right shoulder region for increased I with dressing and bathing activities.  Short Term Goal 3: Patient will increase right shoulder strength to 3+/5 for increased ability to lift pots and pans at work. Short Term Goal 4: Patient will decrease fascial restrictions to min-mod in her right shoulder region for increased ability to complete daily activities.  Short Term Goal 5: Patient will decrease pain level to 5/10 in her right shoulder when she is combing her hair. Long Term Goals Time to Complete Long Term Goals: 12 weeks Long Term Goal 1: Patient will return to prior level of independence with all B/IADLs, work, leisure activities and improve her score on the UEFI to 85% or better. Long Term Goal 1 Progress: Progressing toward goal Long Term Goal 2: Patient will increase right shoulder AROM to [facility] for increased ability to reach into overhead cabinets in her kitchen. Long Term Goal 3: Patient will increase right shoulder strength to 4+/5 or better for increased ability to lift pots and pans at work. Long Term Goal 3 Progress: Progressing toward goal Long Term Goal 4: Patient will decrease pain level to 2/10 in her right shoulder when combing her hair. Long Term Goal 5:  Patient will decrease  fascial restrictions to minimal in her right shoulder region for increased mobility needed for ADL completion.   Problem List Patient Active Problem List   Diagnosis Date Noted  . S/P complete repair of rotator cuff 07/24/2012  . Pain in joint, shoulder region 06/26/2012  . Muscle weakness (generalized) 06/26/2012  . Shoulder arthritis 02/08/2012  . Rotator cuff rupture, complete 02/08/2012  . Chest pain 09/30/2010  . Essential hypertension, benign 09/30/2010  . DIAB W/UNSPEC COMP TYPE II/UNSPEC TYPE UNCNTRL 09/05/2008  . OBESITY-MORBID (>100') 09/05/2008  . ESOPHAGEAL REFLUX 09/05/2008  . EDEMA 09/05/2008    End of Session Activity Tolerance: Patient tolerated treatment well General Behavior During Therapy: [facility] for tasks assessed/performed Cognition: [facility] for tasks performed  Ailene Ravel, OTR/L,CBIS   10/16/2012, 2:54 PM

## 2012-10-18 ENCOUNTER — Ambulatory Visit (HOSPITAL_COMMUNITY)
Admission: RE | Admit: 2012-10-18 | Discharge: 2012-10-18 | Disposition: A | Discharge status: Home / Self Care | Payer: Medicare Other | Source: Ambulatory Visit | Attending: Orthopedic Surgery | Admitting: Orthopedic Surgery

## 2012-10-18 DIAGNOSIS — IMO0001 Reserved for inherently not codable concepts without codable children: Secondary | ICD-10-CM | POA: Insufficient documentation

## 2012-10-18 DIAGNOSIS — M6281 Muscle weakness (generalized): Secondary | ICD-10-CM | POA: Insufficient documentation

## 2012-10-18 DIAGNOSIS — E119 Type 2 diabetes mellitus without complications: Secondary | ICD-10-CM | POA: Insufficient documentation

## 2012-10-18 DIAGNOSIS — M25511 Pain in right shoulder: Secondary | ICD-10-CM

## 2012-10-18 DIAGNOSIS — M25519 Pain in unspecified shoulder: Secondary | ICD-10-CM | POA: Insufficient documentation

## 2012-10-18 NOTE — Progress Notes (Signed)
Occupational Therapy Treatment Patient Details  Name: [name] [name] MRN: [id number] Date of Birth: [birth year]  Today's Date: 10/18/2012 Time: 1660-6301 OT Time Calculation (min): 38 min Manual Therapy 6010-9323 11' Therapeutic Exercises 5573-2202 27' Visit#: 53 of 65  Re-eval: 10/22/12    Authorization: BCBS Medicare   Authorization Time Period: before 32 visit  Authorization Visit#: 31 of 32  Subjective S: I tried to pick up a bottle of clorox yesterday off of the counter, and it about killed me! Limitations: See scanned protocol for full details.  Pain Assessment Currently in Pain?: Yes Pain Score:   2 Pain Location: Shoulder Pain Orientation: Right Pain Type: Acute pain  Precautions/Restrictions    See scanned protocol for full details.   Exercise/Treatments Supine Protraction: PROM;10 reps;Strengthening;12 reps Protraction Weight (lbs): 2 Horizontal ABduction: PROM;10 reps;Strengthening;12 reps Horizontal ABduction Weight (lbs): 2 External Rotation: PROM;10 reps;Strengthening;12 reps External Rotation Weight (lbs): 2 Internal Rotation: PROM;10 reps;Strengthening;12 reps Internal Rotation Weight (lbs): 2 Flexion: PROM;10 reps;Strengthening;12 reps Shoulder Flexion Weight (lbs): 2 ABduction: PROM;10 reps;Strengthening;12 reps Shoulder ABduction Weight (lbs): 2 Seated Protraction: Strengthening;10 reps Protraction Weight (lbs): 2 Horizontal ABduction: Strengthening;10 reps Horizontal ABduction Weight (lbs): 1 External Rotation: Strengthening;10 reps;Limitations External Rotation Weight (lbs): 2 External Rotation Limitations: with facilitation at end range  Internal Rotation: Strengthening;10 reps Internal Rotation Weight (lbs): 2 Flexion: Strengthening;10 reps Flexion Weight (lbs): 1 Abduction: Strengthening;10 reps ABduction Weight (lbs): 1 ROM / Strengthening / Isometric Strengthening UBE (Upper Arm Bike): 3' and 3' 3.5 Cybex Press: 2 plate;20  reps Cybex Row: 2 plate;20 reps Wall Wash: omit this date "W" Arms: 15X with 1# X to V Arms: 15X with 1# Proximal Shoulder Strengthening, Seated: 15 times with 1# without resting between exercises Ball on Wall: 1' flexion and 1' abduction with green weighted ball. Other ROM/Strengthening Exercises: functional activity simulating lifting clorox bottle (3 pound weight) up to overhead shelf and pouring into sink X 10 reps each      Manual Therapy Manual Therapy: Myofascial release Myofascial Release: Myofascial release and manual stretching to right upper arm, upper trapezius, and lateral and medial border of scapula with focus to decrease restrictions to decrease pain to allow for increase AROM  Occupational Therapy Assessment and Plan OT Assessment and Plan Clinical Impression Statement: A:  Increased to 2# with protraction and external rotation/internal rotation in seated.  added functional activity of pouring and lifting 3 pound bleach bottle. OT Plan: P:  Reassess for possible dc   Goals Short Term Goals Time to Complete Short Term Goals: 6 weeks Short Term Goal 1: Patient will be educated on a HEP. Short Term Goal 2: Patient will increase PROM to [facility] in her right shoulder region for increased I with dressing and bathing activities.  Short Term Goal 3: Patient will increase right shoulder strength to 3+/5 for increased ability to lift pots and pans at work. Short Term Goal 4: Patient will decrease fascial restrictions to min-mod in her right shoulder region for increased ability to complete daily activities.  Short Term Goal 5: Patient will decrease pain level to 5/10 in her right shoulder when she is combing her hair. Long Term Goals Time to Complete Long Term Goals: 12 weeks Long Term Goal 1: Patient will return to prior level of independence with all B/IADLs, work, leisure activities and improve her score on the UEFI to 85% or better. Long Term Goal 2: Patient will increase right  shoulder AROM to [facility] for increased ability to reach into overhead cabinets  in her kitchen. Long Term Goal 3: Patient will increase right shoulder strength to 4+/5 or better for increased ability to lift pots and pans at work. Long Term Goal 4: Patient will decrease pain level to 2/10 in her right shoulder when combing her hair. Long Term Goal 5: Patient will decrease fascial restrictions to minimal in her right shoulder region for increased mobility needed for ADL completion.   Problem List Patient Active Problem List   Diagnosis Date Noted  . S/P complete repair of rotator cuff 07/24/2012  . Pain in joint, shoulder region 06/26/2012  . Muscle weakness (generalized) 06/26/2012  . Shoulder arthritis 02/08/2012  . Rotator cuff rupture, complete 02/08/2012  . Chest pain 09/30/2010  . Essential hypertension, benign 09/30/2010  . DIAB W/UNSPEC COMP TYPE II/UNSPEC TYPE UNCNTRL 09/05/2008  . OBESITY-MORBID (>100') 09/05/2008  . ESOPHAGEAL REFLUX 09/05/2008  . EDEMA 09/05/2008    End of Session Activity Tolerance: Patient tolerated treatment well General Behavior During Therapy: [facility] for tasks assessed/performed Cognition: WFL for tasks performed  Baldwin, OTR/L  10/18/2012, 1:47 PM

## 2012-10-22 ENCOUNTER — Ambulatory Visit (HOSPITAL_COMMUNITY)
Admission: RE | Admit: 2012-10-22 | Discharge: 2012-10-22 | Disposition: A | Discharge status: Home / Self Care | Payer: Medicare Other | Source: Ambulatory Visit | Attending: Orthopedic Surgery | Admitting: Orthopedic Surgery

## 2012-10-22 NOTE — Progress Notes (Signed)
Occupational Therapy Treatment Patient Details  Name: [name] [name] MRN: [id number] Date of Birth: December 30, 1955  Today's Date: 10/22/2012 Time: 4403-4742 OT Time Calculation (min): 27 min Manual Therapy 5956-3875 15' Reassessment 6433-2951 12' Visit#: 47 of 71  Re-eval: 10/22/12    Authorization: [employer] Medicare   Authorization Time Period: before 32 visit  Authorization Visit#: 32 of 32  Subjective S:  I figure I can do everything I need to do at work now. Limitations: See scanned protocol for full details.  Special Tests: UEFI is 90% I level this date.  Pain Assessment Currently in Pain?: No/denies  Precautions/Restrictions   progress as tolerated  Exercise/Treatments    Manual Therapy Manual Therapy: Myofascial release Myofascial Release: Myofascial release and manual stretching to right upper arm, upper trapezius, and lateral and medial border of scapula with focus to decrease restrictions to decrease pain to allow for increase AROM  Occupational Therapy Assessment and Plan OT Assessment and Plan Clinical Impression Statement: A:  Patient has met all short term goals and met or partly met long term goals.  Current AROM and strength (10/11/12)  flexion 164 4/5 (135 4/5), abduction 161 4+/5 (99 4/5), external rotation with shoulder abducted 70 5/5 (with shoulder adducted 90 4/5), internal rotation with shoulder abducted 70 5/5 (with shoulder adducted 82 4/5).  UEFI score improved to 90% Independent level.  Patient educated on strengthening with theraband with mod resist tband for flexion and abduction this date.  OT Plan: P:  DC with HEP for continued strengthening.    Goals Short Term Goals Time to Complete Short Term Goals: 6 weeks Short Term Goal 1: Patient will be educated on a HEP. Short Term Goal 1 Progress: Met Short Term Goal 2: Patient will increase PROM to [facility] in her right shoulder region for increased I with dressing and bathing activities.  Short Term Goal 2  Progress: Met Short Term Goal 3: Patient will increase right shoulder strength to 3+/5 for increased ability to lift pots and pans at work. Short Term Goal 3 Progress: Met Short Term Goal 4: Patient will decrease fascial restrictions to min-mod in her right shoulder region for increased ability to complete daily activities.  Short Term Goal 4 Progress: Met Short Term Goal 5: Patient will decrease pain level to 5/10 in her right shoulder when she is combing her hair. Short Term Goal 5 Progress: Met Long Term Goals Time to Complete Long Term Goals: 12 weeks Long Term Goal 1: Patient will return to prior level of independence with all B/IADLs, work, leisure activities and improve her score on the UEFI to 85% or better. Long Term Goal 1 Progress: Partly met Long Term Goal 2: Patient will increase right shoulder AROM to [facility] for increased ability to reach into overhead cabinets in her kitchen. Long Term Goal 2 Progress: Met Long Term Goal 3: Patient will increase right shoulder strength to 4+/5 or better for increased ability to lift pots and pans at work. Long Term Goal 3 Progress: Partly met Long Term Goal 4: Patient will decrease pain level to 2/10 in her right shoulder when combing her hair. Long Term Goal 4 Progress: Met Long Term Goal 5: Patient will decrease fascial restrictions to minimal in her right shoulder region for increased mobility needed for ADL completion.  Long Term Goal 5 Progress: Met  Problem List Patient Active Problem List   Diagnosis Date Noted  . S/P complete repair of rotator cuff 07/24/2012  . Pain in joint, shoulder region 06/26/2012  .  Muscle weakness (generalized) 06/26/2012  . Shoulder arthritis 02/08/2012  . Rotator cuff rupture, complete 02/08/2012  . Chest pain 09/30/2010  . Essential hypertension, benign 09/30/2010  . DIAB W/UNSPEC COMP TYPE II/UNSPEC TYPE UNCNTRL 09/05/2008  . OBESITY-MORBID (>100') 09/05/2008  . ESOPHAGEAL REFLUX 09/05/2008  . EDEMA  09/05/2008    End of Session Activity Tolerance: Patient tolerated treatment well General Behavior During Therapy: WFL for tasks assessed/performed Cognition: WFL for tasks performed OT Plan of Care OT Home Exercise Plan: added flexion and abduction to strengthening HEP.  GO Functional Assessment Tool Used: UEFI was 78% Independent, and is currently 90% and 10% impairment level.  Self Care Goal Status [phone]): 0 percent impaired, limited or restricted Self Care Discharge Status [phone]): At least 1 percent but less than 20 percent impaired, limited or restricted  Vangie Bicker, OTR/L  10/22/2012, 2:25 PM

## 2012-10-23 ENCOUNTER — Ambulatory Visit: Payer: Medicare Other | Admitting: Orthopedic Surgery

## 2012-10-24 ENCOUNTER — Ambulatory Visit (HOSPITAL_COMMUNITY): Payer: Medicare Other | Admitting: Specialist

## 2012-10-25 ENCOUNTER — Encounter: Payer: Self-pay | Admitting: Orthopedic Surgery

## 2012-10-25 ENCOUNTER — Emergency Department (HOSPITAL_COMMUNITY)
Admission: EM | Admit: 2012-10-25 | Discharge: 2012-10-26 | Disposition: A | Discharge status: Home / Self Care | Payer: Medicare Other | Attending: Emergency Medicine | Admitting: Emergency Medicine

## 2012-10-25 ENCOUNTER — Ambulatory Visit (INDEPENDENT_AMBULATORY_CARE_PROVIDER_SITE_OTHER): Payer: Medicare Other | Admitting: Orthopedic Surgery

## 2012-10-25 ENCOUNTER — Encounter (HOSPITAL_COMMUNITY): Payer: Self-pay | Admitting: Emergency Medicine

## 2012-10-25 VITALS — BP 170/80 | Ht 65.0 in | Wt 308.0 lb

## 2012-10-25 DIAGNOSIS — E119 Type 2 diabetes mellitus without complications: Secondary | ICD-10-CM | POA: Insufficient documentation

## 2012-10-25 DIAGNOSIS — Z9889 Other specified postprocedural states: Secondary | ICD-10-CM

## 2012-10-25 DIAGNOSIS — J3489 Other specified disorders of nose and nasal sinuses: Secondary | ICD-10-CM | POA: Insufficient documentation

## 2012-10-25 DIAGNOSIS — J449 Chronic obstructive pulmonary disease, unspecified: Secondary | ICD-10-CM | POA: Insufficient documentation

## 2012-10-25 DIAGNOSIS — R51 Headache: Secondary | ICD-10-CM | POA: Insufficient documentation

## 2012-10-25 DIAGNOSIS — Z7982 Long term (current) use of aspirin: Secondary | ICD-10-CM | POA: Insufficient documentation

## 2012-10-25 DIAGNOSIS — Z872 Personal history of diseases of the skin and subcutaneous tissue: Secondary | ICD-10-CM | POA: Insufficient documentation

## 2012-10-25 DIAGNOSIS — Z794 Long term (current) use of insulin: Secondary | ICD-10-CM | POA: Insufficient documentation

## 2012-10-25 DIAGNOSIS — H53149 Visual discomfort, unspecified: Secondary | ICD-10-CM | POA: Insufficient documentation

## 2012-10-25 DIAGNOSIS — M129 Arthropathy, unspecified: Secondary | ICD-10-CM | POA: Insufficient documentation

## 2012-10-25 DIAGNOSIS — F329 Major depressive disorder, single episode, unspecified: Secondary | ICD-10-CM | POA: Insufficient documentation

## 2012-10-25 DIAGNOSIS — Z85850 Personal history of malignant neoplasm of thyroid: Secondary | ICD-10-CM | POA: Insufficient documentation

## 2012-10-25 DIAGNOSIS — F3289 Other specified depressive episodes: Secondary | ICD-10-CM | POA: Insufficient documentation

## 2012-10-25 DIAGNOSIS — J4489 Other specified chronic obstructive pulmonary disease: Secondary | ICD-10-CM | POA: Insufficient documentation

## 2012-10-25 DIAGNOSIS — Z880 Allergy status to penicillin: Secondary | ICD-10-CM | POA: Insufficient documentation

## 2012-10-25 DIAGNOSIS — E039 Hypothyroidism, unspecified: Secondary | ICD-10-CM | POA: Insufficient documentation

## 2012-10-25 DIAGNOSIS — Z8719 Personal history of other diseases of the digestive system: Secondary | ICD-10-CM | POA: Insufficient documentation

## 2012-10-25 DIAGNOSIS — Z9861 Coronary angioplasty status: Secondary | ICD-10-CM | POA: Insufficient documentation

## 2012-10-25 DIAGNOSIS — I10 Essential (primary) hypertension: Secondary | ICD-10-CM | POA: Insufficient documentation

## 2012-10-25 DIAGNOSIS — Z79899 Other long term (current) drug therapy: Secondary | ICD-10-CM | POA: Insufficient documentation

## 2012-10-25 DIAGNOSIS — R110 Nausea: Secondary | ICD-10-CM | POA: Insufficient documentation

## 2012-10-25 MED ORDER — DEXAMETHASONE SODIUM PHOSPHATE 4 MG/ML IJ SOLN
10.0000 mg | Freq: Once | INTRAMUSCULAR | Status: AC
  Administered 2012-10-25: 10 mg via INTRAVENOUS
  Filled 2012-10-25: qty 3

## 2012-10-25 MED ORDER — SODIUM CHLORIDE 0.9 % IV SOLN
1000.0000 mL | Freq: Once | INTRAVENOUS | Status: AC
  Administered 2012-10-25: 1000 mL via INTRAVENOUS

## 2012-10-25 MED ORDER — METOCLOPRAMIDE HCL 5 MG/ML IJ SOLN
10.0000 mg | Freq: Once | INTRAMUSCULAR | Status: AC
  Administered 2012-10-25: 10 mg via INTRAVENOUS
  Filled 2012-10-25: qty 2

## 2012-10-25 MED ORDER — SODIUM CHLORIDE 0.9 % IV SOLN: 1000.0000 mL | INTRAVENOUS | Status: DC

## 2012-10-25 MED ORDER — DIPHENHYDRAMINE HCL 50 MG/ML IJ SOLN
25.0000 mg | Freq: Once | INTRAMUSCULAR | Status: AC
  Administered 2012-10-25: 25 mg via INTRAVENOUS
  Filled 2012-10-25: qty 1

## 2012-10-25 MED ORDER — KETOROLAC TROMETHAMINE 30 MG/ML IJ SOLN
30.0000 mg | Freq: Once | INTRAMUSCULAR | Status: AC
  Administered 2012-10-25: 30 mg via INTRAVENOUS
  Filled 2012-10-25: qty 1

## 2012-10-25 NOTE — ED Notes (Signed)
Pt c/o headache with nausea since last night. Pt states she has not had a headache this bad since the 90s.

## 2012-10-25 NOTE — ED Provider Notes (Signed)
History  This chart was scribed for [name] Norrie, MD by Jenne Campus, ED Scribe. This patient was seen in room APA15/APA15 and the patient's care was started at 9:37 PM.  CSN: [id number]  Arrival date & time 10/25/12  1859   First MD Initiated Contact with Patient 10/25/12 2047      Chief Complaint  Patient presents with  . Headache     Patient is a 57 y.o. female presenting with headaches. The history is provided by the patient. No language interpreter was used.  Headache Pain location:  Frontal Quality: pressure. Radiates to:  Does not radiate Onset quality:  Gradual Duration:  1 day Timing:  Constant Progression:  Worsening Chronicity:  New Similar to prior headaches: yes   Relieved by:  Resting in a darkened room Worsened by:  Light and sound Associated symptoms: nausea   Associated symptoms: no cough, no numbness and no vomiting     HPI Comments: [name] [name] is a 57 y.o. female who presents to the Emergency Department complaining of gradual onset, gradually worsening, constant frontally located HA described as pressure with occasional throbbing with associated photophobia, nasal congestion and nausea that started yesterday. She took one half of a hydrocodone pain pill with no improvement. She states that the HA is similar to prior HAs experienced in the 90s. She reports that she is under more stress due to her mother being in a nursing home. She denies emesis, visual disturbances, cough, numbness and tingling in lower legs as associated symptoms.    She has a h/o DM, HTN, COPD and thyroid CA with a thyroidectomy. Pt denies smoking and alcohol use.  PCP is Dr. Orson Ape  Past Medical History  Diagnosis Date  . Type 2 diabetes mellitus   . Essential hypertension, benign   . GERD (gastroesophageal reflux disease)   . Edema     feet and legs  . Morbid obesity   . COPD (chronic obstructive pulmonary disease)   . Thyroid disease   . Asthma   . Arthritis   .  Depression   . Cirrhosis of liver   . Hypothyroidism 2009    thyroid cancer  . Cancer     thyroid    Past Surgical History  Procedure Laterality Date  . Thyroidectomy  2009  . Cholecystectomy  1996  . Tonsillectomy  1958  . Foot surgery  2000    bone spur removed  . Abdominal hysterectomy  1985  . Exploratory laparotomy  2003  . Umbilical hernia repair  2010  . Debridement of abdominal wound  2011  . Cesarean section  1977  . Cardiac catheterization  2012  . Resection distal clavical  05/07/2012    Procedure: RESECTION DISTAL CLAVICAL;  Surgeon: [name], MD;  Location: [facility];  Service: Orthopedics;  Laterality: Right;  . Shoulder open rotator cuff repair  05/07/2012    Procedure: ROTATOR CUFF REPAIR SHOULDER OPEN;  Surgeon: [name], MD;  Location: [facility];  Service: Orthopedics;  Laterality: Right;    Family History  Problem Relation Age of Onset  . Heart disease    . Arthritis    . Cancer    . Asthma    . Diabetes    . Kidney disease      History  Substance Use Topics  . Smoking status: Never Smoker   . Smokeless tobacco: Never Used  . 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  Review of Systems  Eyes: Negative for visual disturbance.  Respiratory: Negative for cough.   Gastrointestinal: Positive for nausea. Negative for vomiting.  Neurological: Positive for headaches. Negative for weakness and numbness.  All other systems reviewed and are negative.    Allergies  Adhesive; Clarithromycin; Clindamycin/lincomycin; Neosporin; and Penicillins  Home Medications   Current Outpatient Rx  Name  Route  Sig  Dispense  Refill  . aspirin 81 MG EC tablet   Oral   Take 81 mg by mouth daily.          [name] buPROPion (WELLBUTRIN SR) 150 MG 12 hr tablet   Oral   Take 150 mg by mouth 2 (two) times daily.          . CELEBREX 200 MG capsule   Oral   Take 200 mg by mouth daily.          . colesevelam  (WELCHOL) 625 MG tablet   Oral   Take 1,875 mg by mouth 2 (two) times daily with a meal. 3 tabs am 3 tabs pm         . diltiazem (CARDIZEM CD) 240 MG 24 hr capsule   Oral   Take 240 mg by mouth daily.          . DULoxetine (CYMBALTA) 60 MG capsule   Oral   Take 120 mg by mouth daily.          . furosemide (LASIX) 80 MG tablet   Oral   Take 80 mg by mouth daily.          [name] HYDROcodone-acetaminophen (NORCO) 10-325 MG per tablet   Oral   Take 1 tablet by mouth every 6 (six) hours as needed for pain.   60 tablet   5   . insulin glargine (LANTUS SOLOSTAR) 100 UNIT/ML injection   Subcutaneous   Inject 15 Units into the skin daily.         [name] levothyroxine (SYNTHROID, LEVOTHROID) 300 MCG tablet   Oral   Take 300 mcg by mouth daily.           [name] linagliptin (TRADJENTA) 5 MG TABS tablet   Oral   Take 5 mg by mouth daily.         [name] liothyronine (CYTOMEL) 5 MCG tablet   Oral   Take 5 mcg by mouth daily.         [name] lisinopril (PRINIVIL,ZESTRIL) 40 MG tablet   Oral   Take 40 mg by mouth daily.         . metFORMIN (GLUCOPHAGE) 1000 MG tablet   Oral   Take 1,000 mg by mouth 2 (two) times daily with a meal.         . nystatin (NYSTOP) 100000 UNIT/GM POWD   Topical   Apply 100,000 g topically daily. Rash         . simvastatin (ZOCOR) 20 MG tablet   Oral   Take 20 mg by mouth daily.          [name] albuterol (PROVENTIL HFA) 108 (90 BASE) MCG/ACT inhaler   Inhalation   Inhale 2 puffs into the lungs every 6 (six) hours as needed. Shortness of breath         . clobetasol ointment (TEMOVATE) 0.05 %   Topical   Apply 1 application topically daily as needed.         . VOLTAREN 1 % GEL   Topical   Apply 4 g  topically daily as needed. For pain           Triage Vitals: BP 193/77  Pulse 102  Temp(Src) 98 F (36.7 C) (Oral)  Resp 16  Ht 5' 5"  (1.651 m)  Wt 307 lb 12.8 oz (139.617 kg)  BMI 51.22 kg/m2  SpO2 100%  Physical Exam  Nursing note and  vitals reviewed. Constitutional: She is oriented to person, place, and time. She appears well-developed and well-nourished.  Non-toxic appearance. She does not appear ill. No distress.  Face is flushed, patient in a darkened room with her head covered Morbidly obese  HENT:  Head: Normocephalic and atraumatic.  Right Ear: External ear normal.  Left Ear: External ear normal.  Nose: Nose normal. No mucosal edema or rhinorrhea.  Mouth/Throat: Oropharynx is clear and moist and mucous membranes are normal. No dental abscesses or edematous.  Tenderness over the frontal and maxillary sinuses to palpation, right nare membranes are boggy and erythematous, normal membranes in the left nares  Eyes: Conjunctivae and EOM are normal. Pupils are equal, round, and reactive to light.  Neck: Normal range of motion and full passive range of motion without pain. Neck supple.  Cardiovascular: Normal rate, regular rhythm and normal heart sounds.  Exam reveals no gallop and no friction rub.   No murmur heard. Pulmonary/Chest: Effort normal and breath sounds normal. No respiratory distress. She has no wheezes. She has no rhonchi. She has no rales. She exhibits no tenderness and no crepitus.  Abdominal: Soft. Normal appearance and bowel sounds are normal. She exhibits no distension. There is no tenderness. There is no rebound and no guarding.  Musculoskeletal: Normal range of motion. She exhibits no edema and no tenderness.  Moves all extremities well.   Neurological: She is alert and oriented to person, place, and time. She has normal strength. No cranial nerve deficit.  Skin: Skin is warm, dry and intact. No rash noted. No erythema. No pallor.  Psychiatric: She has a normal mood and affect. Her speech is normal and behavior is normal. Her mood appears not anxious.    ED Course  Procedures (including critical care time)  Medications  0.9 %  sodium chloride infusion (0 mLs Intravenous Stopped 10/26/12 0010)     Followed by  0.9 %  sodium chloride infusion (0 mLs Intravenous Duplicate 08/18/57 4585)  metoCLOPramide (REGLAN) injection 10 mg (10 mg Intravenous Given 10/25/12 2230)  diphenhydrAMINE (BENADRYL) injection 25 mg (25 mg Intravenous Given 10/25/12 2229)  ketorolac (TORADOL) 30 MG/ML injection 30 mg (30 mg Intravenous Given 10/25/12 2230)  dexamethasone (DECADRON) injection 10 mg (10 mg Intravenous Given 10/25/12 2328)    DIAGNOSTIC STUDIES: Oxygen Saturation is 100% on room air, normal by my interpretation.    COORDINATION OF CARE: 9:41 PM-Discussed treatment plan which includes migraine cocktail with pt at bedside and pt agreed to plan.   10:44 PM- Discussed discharge plan which includes motrin and ibuprofen for fever with pt and mother and both agreed to plan. Also advised pt to continue antibiotics. Addressed symptoms to return for with pt.   Recheck at 23:15 states headache almost gone "wants alittle bit of nothing" to make it go away.   At discharge sitting on side of bed, states she is ready to go home.    1. Headache     New Prescriptions   ONDANSETRON (ZOFRAN ODT) 4 MG DISINTEGRATING TABLET    Take 1 tablet (4 mg total) by mouth every 8 (eight) hours as needed for nausea.  Plan discharge  Rolland Porter, MD, FACEP   MDM    I personally performed the services described in this documentation, which was scribed in my presence. The recorded information has been reviewed and considered.  Rolland Porter, MD, FACEP    [name] Norrie, MD 10/26/12 [phone]

## 2012-10-25 NOTE — ED Notes (Signed)
Went in to reassess patient's pain, patient sleeping.

## 2012-10-25 NOTE — Patient Instructions (Addendum)
RTW note for 11/12/12

## 2012-10-25 NOTE — Progress Notes (Signed)
Patient ID: [name] [name], female   DOB: Dec 03, 1955, 57 y.o.   MRN: [id number] Six-month followup open rotator cuff repair doing well excellent forward elevation good strength full range of motion  No problems released

## 2012-10-26 MED ORDER — ONDANSETRON 4 MG PO TBDP: 4.0000 mg | ORAL_TABLET | Freq: Three times a day (TID) | ORAL | Status: DC | PRN

## 2012-11-14 ENCOUNTER — Encounter (HOSPITAL_COMMUNITY): Payer: Self-pay

## 2012-11-14 ENCOUNTER — Emergency Department (HOSPITAL_COMMUNITY)
Admission: EM | Admit: 2012-11-14 | Discharge: 2012-11-14 | Disposition: A | Discharge status: Home / Self Care | Payer: Medicare Other | Attending: Emergency Medicine | Admitting: Emergency Medicine

## 2012-11-14 DIAGNOSIS — J449 Chronic obstructive pulmonary disease, unspecified: Secondary | ICD-10-CM | POA: Insufficient documentation

## 2012-11-14 DIAGNOSIS — F3289 Other specified depressive episodes: Secondary | ICD-10-CM | POA: Insufficient documentation

## 2012-11-14 DIAGNOSIS — Z880 Allergy status to penicillin: Secondary | ICD-10-CM | POA: Insufficient documentation

## 2012-11-14 DIAGNOSIS — F329 Major depressive disorder, single episode, unspecified: Secondary | ICD-10-CM | POA: Insufficient documentation

## 2012-11-14 DIAGNOSIS — I10 Essential (primary) hypertension: Secondary | ICD-10-CM | POA: Insufficient documentation

## 2012-11-14 DIAGNOSIS — J45909 Unspecified asthma, uncomplicated: Secondary | ICD-10-CM | POA: Insufficient documentation

## 2012-11-14 DIAGNOSIS — Z7982 Long term (current) use of aspirin: Secondary | ICD-10-CM | POA: Insufficient documentation

## 2012-11-14 DIAGNOSIS — Z791 Long term (current) use of non-steroidal anti-inflammatories (NSAID): Secondary | ICD-10-CM | POA: Insufficient documentation

## 2012-11-14 DIAGNOSIS — Z79899 Other long term (current) drug therapy: Secondary | ICD-10-CM | POA: Insufficient documentation

## 2012-11-14 DIAGNOSIS — L03115 Cellulitis of right lower limb: Secondary | ICD-10-CM

## 2012-11-14 DIAGNOSIS — Z794 Long term (current) use of insulin: Secondary | ICD-10-CM | POA: Insufficient documentation

## 2012-11-14 DIAGNOSIS — Y939 Activity, unspecified: Secondary | ICD-10-CM | POA: Insufficient documentation

## 2012-11-14 DIAGNOSIS — Z85850 Personal history of malignant neoplasm of thyroid: Secondary | ICD-10-CM | POA: Insufficient documentation

## 2012-11-14 DIAGNOSIS — L02419 Cutaneous abscess of limb, unspecified: Secondary | ICD-10-CM | POA: Insufficient documentation

## 2012-11-14 DIAGNOSIS — J4489 Other specified chronic obstructive pulmonary disease: Secondary | ICD-10-CM | POA: Insufficient documentation

## 2012-11-14 DIAGNOSIS — E119 Type 2 diabetes mellitus without complications: Secondary | ICD-10-CM | POA: Insufficient documentation

## 2012-11-14 DIAGNOSIS — E039 Hypothyroidism, unspecified: Secondary | ICD-10-CM | POA: Insufficient documentation

## 2012-11-14 DIAGNOSIS — M129 Arthropathy, unspecified: Secondary | ICD-10-CM | POA: Insufficient documentation

## 2012-11-14 DIAGNOSIS — Y929 Unspecified place or not applicable: Secondary | ICD-10-CM | POA: Insufficient documentation

## 2012-11-14 DIAGNOSIS — K7460 Unspecified cirrhosis of liver: Secondary | ICD-10-CM | POA: Insufficient documentation

## 2012-11-14 DIAGNOSIS — L03119 Cellulitis of unspecified part of limb: Secondary | ICD-10-CM | POA: Insufficient documentation

## 2012-11-14 DIAGNOSIS — Z8719 Personal history of other diseases of the digestive system: Secondary | ICD-10-CM | POA: Insufficient documentation

## 2012-11-14 MED ORDER — CEPHALEXIN 500 MG PO CAPS: 500.0000 mg | ORAL_CAPSULE | Freq: Four times a day (QID) | ORAL | Status: DC

## 2012-11-14 MED ORDER — CEPHALEXIN 500 MG PO CAPS
500.0000 mg | ORAL_CAPSULE | Freq: Once | ORAL | Status: AC
  Administered 2012-11-14: 500 mg via ORAL
  Filled 2012-11-14: qty 1

## 2012-11-14 NOTE — ED Notes (Signed)
I few days ago something bit me on my right lower leg. It woke me up swelling, hot to the touch, and I am having trouble walking on it per pt.

## 2012-11-14 NOTE — ED Provider Notes (Signed)
History     This chart was scribed for Virgel Manifold, MD, MD by Rhae Lerner, ED Scribe. The patient was seen in room [facility] and the patient's care was started at 7:18 AM.  CSN: [id number]  Arrival date & time 11/14/12  3893      Chief Complaint  Patient presents with  . Insect Bite  . Leg Swelling     The history is provided by the patient. No language interpreter was used.    HPI Comments: [name] [name] is a 57 y.o. female who presents to the Emergency Department complaining of a suspected insect bite which she experienced six days ago, and which she treated topically until last night when it began to hurt severely. The pain has been constant. She states that the area is most painful in tin the back of her left shin. She states that she has experienced fever and chills. Pt denies numbness, tingling,nausea, vomiting, diarrhea, weakness, cough, SOB and any other pain. She reports that she is a diabetic and has slight neuropathy. She also has a h/o HTN and edema. She states that she is allergic to penicillin and neosporin eye drops.  Past Medical History  Diagnosis Date  . Type 2 diabetes mellitus   . Essential hypertension, benign   . GERD (gastroesophageal reflux disease)   . Edema     feet and legs  . Morbid obesity   . COPD (chronic obstructive pulmonary disease)   . Thyroid disease   . Asthma   . Arthritis   . Depression   . Cirrhosis of liver   . Hypothyroidism 2009    thyroid cancer  . Cancer     thyroid    Past Surgical History  Procedure Laterality Date  . Thyroidectomy  2009  . Cholecystectomy  1996  . Tonsillectomy  1958  . Foot surgery  2000    bone spur removed  . Abdominal hysterectomy  1985  . Exploratory laparotomy  2003  . Umbilical hernia repair  2010  . Debridement of abdominal wound  2011  . Cesarean section  1977  . Cardiac catheterization  2012  . Resection distal clavical  05/07/2012    Procedure: RESECTION DISTAL CLAVICAL;  Surgeon:  [name], MD;  Location: [facility];  Service: Orthopedics;  Laterality: Right;  . Shoulder open rotator cuff repair  05/07/2012    Procedure: ROTATOR CUFF REPAIR SHOULDER OPEN;  Surgeon: [name], MD;  Location: [facility];  Service: Orthopedics;  Laterality: Right;    Family History  Problem Relation Age of Onset  . Heart disease    . Arthritis    . Cancer    . Asthma    . Diabetes    . Kidney disease      History  Substance Use Topics  . Smoking status: Never Smoker   . Smokeless tobacco: Never Used  . Alcohol Use: No   No ob history provided.   Review of Systems  A complete 10 system review of systems was obtained and all systems are negative except as noted in the HPI and PMH.   Allergies  Adhesive; Clarithromycin; Clindamycin/lincomycin; Neosporin; and Penicillins  Home Medications   Current Outpatient Rx  Name  Route  Sig  Dispense  Refill  . albuterol (PROVENTIL HFA) 108 (90 BASE) MCG/ACT inhaler   Inhalation   Inhale 2 puffs into the lungs every 6 (six) hours as needed. Shortness of breath         .  aspirin 81 MG EC tablet   Oral   Take 81 mg by mouth daily.          [name] buPROPion (WELLBUTRIN SR) 150 MG 12 hr tablet   Oral   Take 150 mg by mouth 2 (two) times daily.          . CELEBREX 200 MG capsule   Oral   Take 200 mg by mouth daily.          . clobetasol ointment (TEMOVATE) 0.05 %   Topical   Apply 1 application topically daily as needed.         . colesevelam (WELCHOL) 625 MG tablet   Oral   Take 1,875 mg by mouth 2 (two) times daily with a meal. 3 tabs am 3 tabs pm         . diltiazem (CARDIZEM CD) 240 MG 24 hr capsule   Oral   Take 240 mg by mouth daily.          . DULoxetine (CYMBALTA) 60 MG capsule   Oral   Take 120 mg by mouth daily.          . furosemide (LASIX) 80 MG tablet   Oral   Take 80 mg by mouth daily.          [name] HYDROcodone-acetaminophen (NORCO) 10-325 MG per tablet   Oral   Take 1 tablet  by mouth every 6 (six) hours as needed for pain.   60 tablet   5   . insulin glargine (LANTUS SOLOSTAR) 100 UNIT/ML injection   Subcutaneous   Inject 15 Units into the skin daily.         [name] levothyroxine (SYNTHROID, LEVOTHROID) 300 MCG tablet   Oral   Take 300 mcg by mouth daily.           [name] linagliptin (TRADJENTA) 5 MG TABS tablet   Oral   Take 5 mg by mouth daily.         [name] liothyronine (CYTOMEL) 5 MCG tablet   Oral   Take 5 mcg by mouth daily.         [name] lisinopril (PRINIVIL,ZESTRIL) 40 MG tablet   Oral   Take 40 mg by mouth daily.         . metFORMIN (GLUCOPHAGE) 1000 MG tablet   Oral   Take 1,000 mg by mouth 2 (two) times daily with a meal.         . nystatin (NYSTOP) 100000 UNIT/GM POWD   Topical   Apply 100,000 g topically daily. Rash         . ondansetron (ZOFRAN ODT) 4 MG disintegrating tablet   Oral   Take 1 tablet (4 mg total) by mouth every 8 (eight) hours as needed for nausea.   6 tablet   0   . simvastatin (ZOCOR) 20 MG tablet   Oral   Take 20 mg by mouth daily.          . VOLTAREN 1 % GEL   Topical   Apply 4 g topically daily as needed. For pain           BP 149/75  Pulse 119  Temp(Src) 99.7 F (37.6 C) (Oral)  Resp 20  Ht 5' 4"  (1.626 m)  Wt 304 lb (137.893 kg)  BMI 52.16 kg/m2  SpO2 100%  Physical Exam  Nursing note and vitals reviewed. Constitutional: She is oriented to person, place, and time. She appears well-developed and well-nourished.  HENT:  Head: Normocephalic and atraumatic.  Eyes: EOM are normal. Pupils are equal, round, and reactive to light.  Neck: Normal range of motion. Neck supple.  Cardiovascular: Normal rate, normal heart sounds and intact distal pulses.   Pulmonary/Chest: Effort normal and breath sounds normal.  Abdominal: Bowel sounds are normal. She exhibits no distension. There is no tenderness.  Musculoskeletal: Normal range of motion. She exhibits no edema and no tenderness.  Neurological:  She is alert and oriented to person, place, and time. She has normal strength. No cranial nerve deficit or sensory deficit.  Skin: Skin is warm and dry.   Lateral aspect of her distal right shin. Excoriated region surrounding skin changes consistent with cellulitis. Mild foot swelling. NVI.   Psychiatric: She has a normal mood and affect.    ED Course  Procedures (including critical care time)  DIAGNOSTIC STUDIES: Oxygen Saturation is 100% on room air, normal by my interpretation.    COORDINATION OF CARE:   7:22 AM- Discussed treatment plan with pt which includes a diagnosis of cellulitis. Advised pt to use prescribed antibiotic and not to scrub the affected region. Informed pt that it will take 24-48 hurs for improvement, and informed her to follow up with PCP or return to ED if she experiences emesis or a persistent fever. Prescribed pain medication for her. Pt agreed to plan.   Labs Reviewed - No data to display No results found.   1. Cellulitis of right lower leg       MDM  57 year old female with skin changes consistent with cellulitis the right lower leg. There is some concern given her multiple medical comorbidities, including diabetes. I feel she is appropriate for outpatient treatment at this time though. Return cautions were discussed with the patient.      I personally preformed the services scribed in my presence. The recorded information has been reviewed is accurate. Virgel Manifold, MD.   Virgel Manifold, MD 11/19/12 [phone]

## 2012-11-20 ENCOUNTER — Ambulatory Visit (HOSPITAL_COMMUNITY)
Admission: RE | Admit: 2012-11-20 | Discharge: 2012-11-20 | Disposition: A | Discharge status: Home / Self Care | Payer: Medicare Other | Source: Ambulatory Visit | Attending: Internal Medicine | Admitting: Internal Medicine

## 2012-11-20 DIAGNOSIS — M7989 Other specified soft tissue disorders: Secondary | ICD-10-CM | POA: Insufficient documentation

## 2012-11-20 DIAGNOSIS — IMO0001 Reserved for inherently not codable concepts without codable children: Secondary | ICD-10-CM | POA: Insufficient documentation

## 2012-11-20 DIAGNOSIS — L02419 Cutaneous abscess of limb, unspecified: Secondary | ICD-10-CM | POA: Insufficient documentation

## 2012-11-20 DIAGNOSIS — M79609 Pain in unspecified limb: Secondary | ICD-10-CM | POA: Insufficient documentation

## 2012-11-20 NOTE — Progress Notes (Signed)
Physical Therapy - Wound Therapy  Evaluation   Patient Details  Name: [name] [name] MRN: [id number] Date of Birth: Oct 04, 1955 Charge: evaluation Today's Date: 11/20/2012 Time: 1000-1044 Time Calculation (min): 44 min  Visit#: 1 of 8  Re-eval: 12/20/12  Subjective Subjective Assessment Subjective: [name] [name] states that over two weeks ago she felt something bite her on her leg and she began scratching it.  She states several days later her leg was swollen, painful and red.  She was placed on antibiotc with no appreciable results.  She was placed on a second antibiotic and she still has not seen any difference therefore she was sent to physical therapy to create a healing environment for her wound. Patient and Family Stated Goals: swelling and pain to be gone Date of Onset: 11/04/12  Pain Assessment Pain Assessment Pain Score:   8 Pain Type: Acute pain Pain Location: Calf Pain Orientation: Right Pain Descriptors / Indicators: Throbbing  Wound Therapy Wound 11/20/12 Abrasion(s) Leg Right;Lower;Posterior (Active)  Site / Wound Assessment Red 11/20/2012 11:20 AM  % Wound base Red or Granulating 80% 11/20/2012 11:20 AM  % Wound base Yellow 20% 11/20/2012 11:20 AM  % Wound base Black 0% 11/20/2012 11:20 AM  % Wound base Other (Comment) 0% 11/20/2012 11:20 AM  Peri-wound Assessment Edema;Erythema (non-blanchable) 11/20/2012 11:20 AM  Wound Length (cm) 0.2 cm 11/20/2012 11:20 AM  Wound Width (cm) 1.2 cm 11/20/2012 11:20 AM  Wound Depth (cm) 0.1 cm 11/20/2012 11:20 AM  Margins Attached edges (approximated) 11/20/2012 11:20 AM  Drainage Amount None 11/20/2012 11:20 AM  Treatment Cleansed 11/20/2012 11:20 AM  Dressing Type Compression wrap;Impregnated gauze (bismuth) 11/20/2012 11:20 AM  Dressing Changed New 11/20/2012 11:20 AM  Dressing Status Dry 11/20/2012 11:20 AM     Wound 05/07/12 Abrasion(s) Abdomen Right;Lower pt states "when I get nervous I pick" (Active)     Wound 11/20/12 Blister (Blood filled) Leg  Left;Posterior blister  (Active)  Site / Wound Assessment Pink 11/20/2012 11:20 AM  % Wound base Red or Granulating 100% 11/20/2012 11:20 AM  % Wound base Yellow 0% 11/20/2012 11:20 AM  % Wound base Black 0% 11/20/2012 11:20 AM  % Wound base Other (Comment) 0% 11/20/2012 11:20 AM  Peri-wound Assessment Edema;Erythema (non-blanchable) 11/20/2012 11:20 AM  Wound Length (cm) 5.5 cm 11/20/2012 11:20 AM  Wound Width (cm) 3 cm 11/20/2012 11:20 AM  Wound Depth (cm) 0.1 cm 11/20/2012 11:20 AM  Margins Attached edges (approximated) 11/20/2012 11:20 AM  Closure None 11/20/2012 11:20 AM  Drainage Amount Scant 11/20/2012 11:20 AM  Drainage Description Serous 11/20/2012 11:20 AM  Non-staged Wound Description Partial thickness 11/20/2012 11:20 AM  Treatment Cleansed 11/20/2012 11:20 AM  Dressing Type Compression wrap;Impregnated gauze (bismuth) 11/20/2012 11:20 AM  Dressing Changed New 11/20/2012 11:20 AM  Dressing Status Clean 11/20/2012 11:20 AM     Incision 05/07/12 Shoulder Right (Active)       Physical Therapy Assessment and Plan Wound Therapy - Assess/Plan/Recommendations Wound Therapy - Clinical Statement: Pt with nonhealing wound with cellulitis of the LE.  Volume of Rt LE is 4205.17;   Volume of Lt LE is 3327.47  with a  883.7 cc of volume diference.  Factors Delaying/Impairing Wound Healing: Polypharmacy Hydrotherapy Plan: Debridement Wound Therapy - Frequency:  (2x a week) Wound Therapy - Current Recommendations: PT Wound Plan: Pt to be seen twice a week for 4 weeks for manual techniques to decrease swelling as well as debridement and dressing change.      Goals Wound Therapy  Goals - Improve the function of patient's integumentary system by progressing the wound(s) through the phases of wound healing by: Decrease Necrotic Tissue to: 0 Decrease Necrotic Tissue - Progress: Goal set today Increase Granulation Tissue to: 100 Increase Granulation Tissue - Progress: Goal set today Decrease Length/Width/Depth by (cm):  0 Decrease Length/Width/Depth - Progress: Goal set today Improve Drainage Characteristics:  (0) Improve Drainage Characteristics - Progress: Goal set today Patient/Family will be able to : verbalize the importance of not scratching at wounds or bites. Additional Wound Therapy Goal: decrease volume by 70% Additional Wound Therapy Goal - Progress: Goal set today Goals/treatment plan/discharge plan were made with and agreed upon by patient/family: Yes Time For Goal Achievement:  (4 weeks or until healed) Wound Therapy - Potential for Goals: Good  Problem List Patient Active Problem List   Diagnosis Date Noted  . S/P complete repair of rotator cuff 07/24/2012  . Pain in joint, shoulder region 06/26/2012  . Muscle weakness (generalized) 06/26/2012  . Shoulder arthritis 02/08/2012  . Rotator cuff rupture, complete 02/08/2012  . Chest pain 09/30/2010  . Essential hypertension, benign 09/30/2010  . DIAB W/UNSPEC COMP TYPE II/UNSPEC TYPE UNCNTRL 09/05/2008  . OBESITY-MORBID (>100') 09/05/2008  . ESOPHAGEAL REFLUX 09/05/2008  . EDEMA 09/05/2008    GP Functional Assessment Tool Used: clinical judgement /swelling differene Functional Limitation: Other PT primary Other PT Primary Current Status (S3419): At least 20 percent but less than 40 percent impaired, limited or restricted Other PT Primary Goal Status (Q2229): At least 1 percent but less than 20 percent impaired, limited or restricted  Calani Gick,CINDY 11/20/2012, 11:44 AM.   Your signature is required to indicate approval of the treatment plan as stated above.  Please sign and return making a copy for your files.  You may hard copy or send electronically.  Please check one: ___1.  Approve of this plan  ___2.  Approve of this plan with the following changes.   ____________________________                             _____________ Physician                                                                      Date

## 2012-11-22 ENCOUNTER — Ambulatory Visit (HOSPITAL_COMMUNITY)
Admission: RE | Admit: 2012-11-22 | Discharge: 2012-11-22 | Disposition: A | Discharge status: Home / Self Care | Payer: Medicare Other | Source: Ambulatory Visit | Attending: Internal Medicine | Admitting: Internal Medicine

## 2012-11-22 NOTE — Progress Notes (Signed)
Physical Therapy - Wound Therapy  Treatment   Patient Details  Name: [name] [name] MRN: [id number] Date of Birth: April 30, 1956  Today's Date: 11/22/2012 Time: 1150-1220 Time Calculation (min): 30 min  Visit#: 2 of 8  Re-eval: 12/20/12 Charges: Manual x 23'  Subjective Subjective Assessment Subjective: Pt states that she took compression wrap off the day it was put on because it was too painful.  Pain Assessment Pain Assessment Pain Assessment: No/denies pain  Wound Therapy Wound 11/20/12 Abrasion(s) Leg Right;Lower;Posterior (Active)  Site / Wound Assessment Red 11/22/2012  1:35 PM  % Wound base Red or Granulating 100% 11/22/2012  1:35 PM  % Wound base Yellow 0% 11/22/2012  1:35 PM  % Wound base Black 0% 11/22/2012  1:35 PM  % Wound base Other (Comment) 0% 11/22/2012  1:35 PM  Peri-wound Assessment Edema;Erythema (non-blanchable) 11/22/2012  1:35 PM  Wound Length (cm) 0.2 cm 11/20/2012 11:20 AM  Wound Width (cm) 1.2 cm 11/20/2012 11:20 AM  Wound Depth (cm) 0.1 cm 11/20/2012 11:20 AM  Margins Attached edges (approximated) 11/22/2012  1:35 PM  Drainage Amount None 11/22/2012  1:35 PM  Treatment Cleansed 11/22/2012  1:35 PM  Dressing Type Compression wrap;Impregnated gauze (bismuth) 11/22/2012  1:35 PM  Dressing Changed New 11/22/2012  1:35 PM  Dressing Status Clean 11/22/2012  1:35 PM     Wound 05/07/12 Abrasion(s) Abdomen Right;Lower pt states "when I get nervous I pick" (Active)     Wound 11/20/12 Blister (Blood filled) Leg Left;Posterior blister  (Active)  Site / Wound Assessment Pink 11/22/2012  1:35 PM  % Wound base Red or Granulating 100% 11/22/2012  1:35 PM  % Wound base Yellow 0% 11/22/2012  1:35 PM  % Wound base Black 0% 11/22/2012  1:35 PM  % Wound base Other (Comment) 0% 11/22/2012  1:35 PM  Peri-wound Assessment Edema;Erythema (non-blanchable) 11/22/2012  1:35 PM  Wound Length (cm) 5.5 cm 11/20/2012 11:20 AM  Wound Width (cm) 3 cm 11/20/2012 11:20 AM  Wound Depth (cm) 0.1 cm 11/20/2012 11:20 AM  Margins  Attached edges (approximated) 11/22/2012  1:35 PM  Closure None 11/22/2012  1:35 PM  Drainage Amount Scant 11/22/2012  1:35 PM  Drainage Description Serous 11/22/2012  1:35 PM  Non-staged Wound Description Partial thickness 11/22/2012  1:35 PM  Treatment Cleansed 11/22/2012  1:35 PM  Dressing Type Compression wrap;Impregnated gauze (bismuth) 11/22/2012  1:35 PM  Dressing Changed New 11/22/2012  1:35 PM  Dressing Status Clean 11/22/2012  1:35 PM     Incision 05/07/12 Shoulder Right (Active)     Physical Therapy Assessment and Plan Wound Therapy - Assess/Plan/Recommendations Wound Therapy - Clinical Statement: Blister is healed completely. Small abrasions are 100% granulated and almost healed. Manual techniques completed with LE elevated to decrease edema. Wound Plan: Continue to decrease swelling and complete wound care as necessary.  Problem List Patient Active Problem List   Diagnosis Date Noted  . S/P complete repair of rotator cuff 07/24/2012  . Pain in joint, shoulder region 06/26/2012  . Muscle weakness (generalized) 06/26/2012  . Shoulder arthritis 02/08/2012  . Rotator cuff rupture, complete 02/08/2012  . Chest pain 09/30/2010  . Essential hypertension, benign 09/30/2010  . DIAB W/UNSPEC COMP TYPE II/UNSPEC TYPE UNCNTRL 09/05/2008  . OBESITY-MORBID (>100') 09/05/2008  . ESOPHAGEAL REFLUX 09/05/2008  . EDEMA 09/05/2008    [name] [name], PTA  11/22/2012, 1:49 PM

## 2012-11-26 ENCOUNTER — Ambulatory Visit (HOSPITAL_COMMUNITY)
Admission: RE | Admit: 2012-11-26 | Discharge: 2012-11-26 | Disposition: A | Discharge status: Home / Self Care | Payer: Medicare Other | Source: Ambulatory Visit | Attending: Family Medicine | Admitting: Family Medicine

## 2012-11-26 NOTE — Progress Notes (Signed)
Physical Therapy - Wound Therapy  Treatment   Patient Details  Name: [name] [name] MRN: [id number] Date of Birth: 1955/12/15 Charge:  Manual drainage 8:47-9:25 Today's Date: 11/26/2012 Time: 5885-0277 Time Calculation (min): 40 min  Visit#: 3 of 8  Re-eval: 12/20/12  Subjective Subjective Assessment Subjective: Pt states that she had to take the compression wrap off again.  Therapist explained how important it was to keep the wrap on if able. Patient and Family Stated Goals: swelling and pain to be gone Date of Onset: 11/04/12  Pain Assessment Pain Assessment Pain Score:   7 Pain Type: Acute pain Pain Location: Calf Pain Orientation: Right Pain Descriptors / Indicators: Throbbing  Wound Therapy Wound 11/20/12 Abrasion(s) Leg Right;Lower;Anterior (Active)  Site / Wound Assessment Red 11/26/2012  9:42 AM  % Wound base Red or Granulating 100% 11/26/2012  9:42 AM  % Wound base Yellow 0% 11/26/2012  9:42 AM  % Wound base Black 0% 11/26/2012  9:42 AM  % Wound base Other (Comment) 0% 11/26/2012  9:42 AM  Peri-wound Assessment Edema;Erythema (blanchable) 11/26/2012  9:42 AM  Wound Length (cm) 0 cm 11/26/2012  9:42 AM  Wound Width (cm) 0 cm 11/26/2012  9:42 AM  Wound Depth (cm) 0 cm 11/26/2012  9:42 AM  Margins Attached edges (approximated) 11/26/2012  9:42 AM  Drainage Amount None 11/26/2012  9:42 AM  Treatment Cleansed 11/22/2012  1:35 PM  Dressing Type Compression wrap 11/26/2012  9:42 AM  Dressing Changed New 11/26/2012  9:42 AM  Dressing Status Clean 11/26/2012  9:42 AM     Wound 05/07/12 Abrasion(s) Abdomen Right;Lower pt states "when I get nervous I pick" (Active)     Wound 11/20/12 Blister (Blood filled) Leg Posterior;Right blister  (Active)  Site / Wound Assessment Pink 11/26/2012  9:42 AM  % Wound base Red or Granulating 100% 11/26/2012  9:42 AM  % Wound base Yellow 0% 11/26/2012  9:42 AM  % Wound base Black 0% 11/26/2012  9:42 AM  % Wound base Other (Comment) 0% 11/22/2012  1:35 PM  Peri-wound  Assessment Edema;Erythema (non-blanchable);Induration 11/26/2012  9:42 AM  Wound Length (cm) 5.5 cm 11/20/2012 11:20 AM  Wound Width (cm) 3 cm 11/20/2012 11:20 AM  Wound Depth (cm) 0.1 cm 11/20/2012 11:20 AM  Margins Attached edges (approximated) 11/26/2012  9:42 AM  Closure None 11/26/2012  9:42 AM  Drainage Amount Scant 11/26/2012  9:42 AM  Drainage Description Serous 11/26/2012  9:42 AM  Non-staged Wound Description Partial thickness 11/22/2012  1:35 PM  Treatment Cleansed 11/22/2012  1:35 PM  Dressing Type Compression wrap 11/26/2012  9:42 AM  Dressing Changed New 11/26/2012  9:42 AM  Dressing Status Clean 11/22/2012  1:35 PM     Incision 05/07/12 Shoulder Right (Active)       Physical Therapy Assessment and Plan Wound Therapy - Assess/Plan/Recommendations Wound Therapy - Clinical Statement: Blister area is still slightly open but smoall abrasions are healed.  Pt still has significant edema and induration around where the posterior blister was.  Therapist began lymph manual decongestive techinques to decrease swelling.  Therapist used padding along achilles tendon area to attempt to improve comfort of compression wrap.  PT will benefit from compression garment will sent prescription to MD> Factors Delaying/Impairing Wound Healing: Polypharmacy Hydrotherapy Plan: Dressing change;Other (comment) (manual decongestive techniques.) Wound Therapy - Current Recommendations: PT Wound Plan: Continue to decrease swelling and complete wound care as necessary.  Pt to be measured next treatment .      Goals Wound  Therapy Goals - Improve the function of patient's integumentary system by progressing the wound(s) through the phases of wound healing by: Decrease Necrotic Tissue to: 0 Decrease Necrotic Tissue - Progress: Met Increase Granulation Tissue to: 100 Increase Granulation Tissue - Progress: Met Decrease Length/Width/Depth by (cm): 0 Decrease Length/Width/Depth - Progress: Progressing toward goal Patient/Family  will be able to : verbalize the importance of not scratching at wounds or bites. Patient/Family Instruction Goal - Progress: Met Additional Wound Therapy Goal: decrease volume by 70% Additional Wound Therapy Goal - Progress: Progressing toward goal  Problem List Patient Active Problem List   Diagnosis Date Noted  . S/P complete repair of rotator cuff 07/24/2012  . Pain in joint, shoulder region 06/26/2012  . Muscle weakness (generalized) 06/26/2012  . Shoulder arthritis 02/08/2012  . Rotator cuff rupture, complete 02/08/2012  . Chest pain 09/30/2010  . Essential hypertension, benign 09/30/2010  . DIAB W/UNSPEC COMP TYPE II/UNSPEC TYPE UNCNTRL 09/05/2008  . OBESITY-MORBID (>100') 09/05/2008  . ESOPHAGEAL REFLUX 09/05/2008  . EDEMA 09/05/2008    GP Functional Assessment Tool Used: clinical judgement /swelling differene  Alisa Stjames,CINDY 11/26/2012, 9:54 AM

## 2012-11-29 ENCOUNTER — Ambulatory Visit (HOSPITAL_COMMUNITY)
Admission: RE | Admit: 2012-11-29 | Discharge: 2012-11-29 | Disposition: A | Discharge status: Home / Self Care | Payer: Medicare Other | Source: Ambulatory Visit | Attending: Internal Medicine | Admitting: Internal Medicine

## 2012-11-29 NOTE — Progress Notes (Signed)
Physical Therapy - Wound Therapy  Treatment   Patient Details  Name: [name] [name] MRN: [id number] Date of Birth: [birth year]  Today's Date: 11/29/2012 Time: 7408-1448 Time Calculation (min): 39 min  Visit#: 4 of 8  Re-eval: 12/20/12 Charges: Manual x 25'  Subjective Subjective Assessment Subjective: Pt states that wrap was too tight last time and she took it off the same day.  Pain Assessment Pain Assessment Pain Assessment: No/denies pain  Wound Therapy Wound 11/20/12 Abrasion(s) Leg Right;Lower;Anterior (Active)  Site / Wound Assessment Red 11/29/2012 10:22 AM  % Wound base Red or Granulating 100% 11/29/2012 10:22 AM  % Wound base Yellow 0% 11/29/2012 10:22 AM  % Wound base Black 0% 11/29/2012 10:22 AM  % Wound base Other (Comment) 0% 11/29/2012 10:22 AM  Peri-wound Assessment Edema;Erythema (blanchable) 11/29/2012 10:22 AM  Wound Length (cm) 0 cm 11/26/2012  9:42 AM  Wound Width (cm) 0 cm 11/26/2012  9:42 AM  Wound Depth (cm) 0 cm 11/26/2012  9:42 AM  Margins Attached edges (approximated) 11/29/2012 10:22 AM  Drainage Amount None 11/29/2012 10:22 AM  Treatment Cleansed 11/29/2012 10:22 AM  Dressing Type Compression wrap;Impregnated gauze (bismuth) 11/29/2012 10:22 AM  Dressing Changed New 11/29/2012 10:22 AM  Dressing Status Clean 11/29/2012 10:22 AM     Wound 05/07/12 Abrasion(s) Abdomen Right;Lower pt states "when I get nervous I pick" (Active)     Wound 11/20/12 Blister (Blood filled) Leg Posterior;Right blister  (Active)  Site / Wound Assessment Pink 11/29/2012 10:22 AM  % Wound base Red or Granulating 100% 11/29/2012 10:22 AM  % Wound base Yellow 0% 11/29/2012 10:22 AM  % Wound base Black 0% 11/29/2012 10:22 AM  % Wound base Other (Comment) 0% 11/22/2012  1:35 PM  Peri-wound Assessment Edema;Erythema (non-blanchable);Induration 11/29/2012 10:22 AM  Wound Length (cm) 5.5 cm 11/20/2012 11:20 AM  Wound Width (cm) 3 cm 11/20/2012 11:20 AM  Wound Depth (cm) 0.1 cm 11/20/2012 11:20 AM   Margins Attached edges (approximated) 11/29/2012 10:22 AM  Closure None 11/29/2012 10:22 AM  Drainage Amount Scant 11/29/2012 10:22 AM  Drainage Description Serous 11/29/2012 10:22 AM  Non-staged Wound Description Partial thickness 11/22/2012  1:35 PM  Treatment Cleansed 11/29/2012 10:22 AM  Dressing Type Compression wrap;Impregnated gauze (bismuth) 11/29/2012 10:22 AM  Dressing Changed New 11/29/2012 10:22 AM  Dressing Status None 11/29/2012 10:22 AM     Incision 05/07/12 Shoulder Right (Active)    Physical Therapy Assessment and Plan Wound Therapy - Assess/Plan/Recommendations Wound Therapy - Clinical Statement: Wounds are not yet completely healed but are 100% granulated. Wounds were cleansed and xeroform was applied to open areas. Wrapped LE with short-stretch bandage from patellar tendon to metatarsal heads. Retro massage completed to decrease edema. Wound Plan: Continue to decrease swelling and complete wound care as necessary.  Pt to be measured next treatment .  Problem List Patient Active Problem List   Diagnosis Date Noted  . S/P complete repair of rotator cuff 07/24/2012  . Pain in joint, shoulder region 06/26/2012  . Muscle weakness (generalized) 06/26/2012  . Shoulder arthritis 02/08/2012  . Rotator cuff rupture, complete 02/08/2012  . Chest pain 09/30/2010  . Essential hypertension, benign 09/30/2010  . DIAB W/UNSPEC COMP TYPE II/UNSPEC TYPE UNCNTRL 09/05/2008  . OBESITY-MORBID (>100') 09/05/2008  . ESOPHAGEAL REFLUX 09/05/2008  . EDEMA 09/05/2008    [name], PTA  11/29/2012, 10:31 AM

## 2012-12-03 ENCOUNTER — Ambulatory Visit (HOSPITAL_COMMUNITY)
Admission: RE | Admit: 2012-12-03 | Discharge: 2012-12-03 | Disposition: A | Discharge status: Home / Self Care | Payer: Medicare Other | Source: Ambulatory Visit | Attending: Internal Medicine | Admitting: Internal Medicine

## 2012-12-03 NOTE — Progress Notes (Signed)
Physical Therapy - Wound Therapy  Treatment   Patient Details  Name: [name] [name] MRN: [id number] Date of Birth: Jan 17, 1956  Today's Date: 12/03/2012 Time: 0160-1093 Time Calculation (min): 37 min  Visit#: 5 of 8  Re-eval: 12/20/12  Subjective Subjective Assessment Subjective: Pt states the short stretch wrap fell  off prior to getting to the car last treatment. Patient and Family Stated Goals: swelling and pain to be gone Date of Onset: 11/04/12  Pain Assessment Pain Assessment Pain Score:   5 Pain Type: Acute pain Pain Location: Calf Pain Orientation: Right Pain Descriptors / Indicators: Throbbing  Wound Therapy Wound 11/20/12 Abrasion(s) Leg Right;Lower;Anterior (Active)  Site / Wound Assessment Red 11/29/2012 10:22 AM  % Wound base Red or Granulating 100% 11/29/2012 10:22 AM  % Wound base Yellow 0% 11/29/2012 10:22 AM  % Wound base Black 0% 11/29/2012 10:22 AM  % Wound base Other (Comment) 0% 11/29/2012 10:22 AM  Peri-wound Assessment Edema;Erythema (blanchable) 11/29/2012 10:22 AM  Wound Length (cm) 0 cm 11/26/2012  9:42 AM  Wound Width (cm) 0 cm 11/26/2012  9:42 AM  Wound Depth (cm) 0 cm 11/26/2012  9:42 AM  Margins Attached edges (approximated) 11/29/2012 10:22 AM  Drainage Amount None 11/29/2012 10:22 AM  Treatment Cleansed 11/29/2012 10:22 AM  Dressing Type Compression wrap;Impregnated gauze (bismuth) 11/29/2012 10:22 AM  Dressing Changed New 11/29/2012 10:22 AM  Dressing Status Clean 11/29/2012 10:22 AM     Wound 05/07/12 Abrasion(s) Abdomen Right;Lower pt states "when I get nervous I pick" (Active)     Wound 11/20/12 Blister (Blood filled) Leg Posterior;Right blister  (Active)  Site / Wound Assessment Pink 12/03/2012 12:10 PM  % Wound base Red or Granulating 100% 12/03/2012 12:10 PM  % Wound base Yellow 0% 12/03/2012 12:10 PM  % Wound base Black 0% 12/03/2012 12:10 PM  % Wound base Other (Comment) 0% 12/03/2012 12:10 PM  Peri-wound Assessment Induration;Edema;Erythema  (blanchable) 12/03/2012 12:10 PM  Wound Length (cm) 5.5 cm 11/20/2012 11:20 AM  Wound Width (cm) 3 cm 11/20/2012 11:20 AM  Wound Depth (cm) 0.1 cm 11/20/2012 11:20 AM  Margins Attached edges (approximated) 12/03/2012 12:10 PM  Closure None 12/03/2012 12:10 PM  Drainage Amount None 12/03/2012 12:10 PM  Drainage Description Serous 11/29/2012 10:22 AM  Non-staged Wound Description Not applicable 2/35/5732 20:25 PM  Treatment Cleansed 12/03/2012 12:10 PM  Dressing Type Compression wrap 12/03/2012 12:10 PM  Dressing Changed New 12/03/2012 12:10 PM  Dressing Status None 12/03/2012 12:10 PM     Incision 05/07/12 Shoulder Right (Active)       Physical Therapy Assessment and Plan Wound Therapy - Assess/Plan/Recommendations Wound Therapy - Clinical Statement: Pt needed no debridement but has increased lymphedema and LE continues to be red and inflammed.  Treatment will transend to manual lymph drainage and compression wrapping. Factors Delaying/Impairing Wound Healing: Diabetes Mellitus;Infection - systemic/local;Vascular compromise;Other (comment) (lymphedema) Hydrotherapy Plan:  (manual techniques with short stretch compression ) Wound Therapy - Frequency: 3X / week Wound Therapy - Current Recommendations: PT Wound Plan: Continue to complete decongestive manual techniques and compression wrapping.  Stress to pt the importance of keeping LE under compression and not scratching.      Goals  slow to progress due to pt continuing to go without compression and scratching at LE>  Problem List Patient Active Problem List   Diagnosis Date Noted  . S/P complete repair of rotator cuff 07/24/2012  . Pain in joint, shoulder region 06/26/2012  . Muscle weakness (generalized) 06/26/2012  . Shoulder arthritis  02/08/2012  . Rotator cuff rupture, complete 02/08/2012  . Chest pain 09/30/2010  . Essential hypertension, benign 09/30/2010  . DIAB W/UNSPEC COMP TYPE II/UNSPEC TYPE UNCNTRL 09/05/2008  . OBESITY-MORBID  (>100') 09/05/2008  . ESOPHAGEAL REFLUX 09/05/2008  . EDEMA 09/05/2008    GP    Angeliz Settlemyre,CINDY 12/03/2012, 12:20 PM

## 2012-12-06 ENCOUNTER — Ambulatory Visit (HOSPITAL_COMMUNITY)
Admission: RE | Admit: 2012-12-06 | Discharge: 2012-12-06 | Disposition: A | Discharge status: Home / Self Care | Payer: Medicare Other | Source: Ambulatory Visit | Attending: Internal Medicine | Admitting: Internal Medicine

## 2012-12-06 NOTE — Progress Notes (Signed)
Physical Therapy - Wound Therapy  Treatment/Discharge summary   Patient Details  Name: [name] [name] MRN: [id number] Date of Birth: [birth year]  Today's Date: 12/06/2012 Time: 0930-1000 Time Calculation (min): 30 min  Visit#: 6 of 8  Re-eval: 12/20/12 Charge:  Self care [phone]  Subjective  Dressing is still falling off.  Pain Assessment  minimal 2/10  Wound Therapy Wound 11/20/12 Abrasion(s) Leg Right;Lower;Anterior (Active)  Site / Wound Assessment Red 11/29/2012 10:22 AM  % Wound base Red or Granulating 100% 12/06/2012 10:07 AM  % Wound base Yellow 0% 11/29/2012 10:22 AM  % Wound base Black 0% 11/29/2012 10:22 AM  % Wound base Other (Comment) 0% 11/29/2012 10:22 AM  Peri-wound Assessment Edema;Erythema (blanchable) 11/29/2012 10:22 AM  Wound Length (cm) 0 cm 12/06/2012 10:07 AM  Wound Width (cm) 0 cm 12/06/2012 10:07 AM  Wound Depth (cm) 0 cm 12/06/2012 10:07 AM  Margins Attached edges (approximated) 11/29/2012 10:22 AM  Drainage Amount None 11/29/2012 10:22 AM  Treatment Cleansed 11/29/2012 10:22 AM  Dressing Type Compression wrap 12/06/2012 10:07 AM  Dressing Changed New 11/29/2012 10:22 AM  Dressing Status Clean 11/29/2012 10:22 AM     Wound 05/07/12 Abrasion(s) Abdomen Right;Lower pt states "when I get nervous I pick" (Active)     Wound 11/20/12 Blister (Blood filled) Leg Posterior;Right blister  (Active)  Site / Wound Assessment Pink 12/03/2012 12:10 PM  % Wound base Red or Granulating 100% 12/06/2012 10:07 AM  % Wound base Yellow 0% 12/06/2012 10:07 AM  % Wound base Black 0% 12/06/2012 10:07 AM  % Wound base Other (Comment) 0% 12/03/2012 12:10 PM  Peri-wound Assessment Induration;Edema;Erythema (blanchable) 12/03/2012 12:10 PM  Wound Length (cm) 0 cm 12/06/2012 10:07 AM  Wound Width (cm) 0 cm 12/06/2012 10:07 AM  Wound Depth (cm) 0 cm 12/06/2012 10:07 AM  Margins Attached edges (approximated) 12/03/2012 12:10 PM  Closure None 12/03/2012 12:10 PM  Drainage Amount None 12/03/2012  12:10 PM  Drainage Description Serous 11/29/2012 10:22 AM  Non-staged Wound Description Not applicable 07/06/3565 01:41 PM  Treatment Cleansed 12/03/2012 12:10 PM  Dressing Type Compression wrap 12/03/2012 12:10 PM  Dressing Changed New 12/03/2012 12:10 PM  Dressing Status None 12/03/2012 12:10 PM     Incision 05/07/12 Shoulder Right (Active)       Physical Therapy Assessment and Plan Wound Therapy - Assess/Plan/Recommendations Wound Therapy - Clinical Statement: LE is no longer red; significant decreased swelling.  R volume at initial eval 4205 current 3890.4 CC a decreased of 64% flutid.  Pt has not been able to keep compression wraps on even with foam.  Recommend compression garment at this time.  ORder was faxed to MD.  Pt continues to have multiple scratch marks on her LE therapist counseled pt about the importance of 1)  keeping lotion on LE, 2) not scratching 3) keeping compression garments on LE . Wound Plan: No further skilled therapy is needed at this time.        Goals Wound Therapy Goals - Improve the function of patient's integumentary system by progressing the wound(s) through the phases of wound healing by: Decrease Necrotic Tissue to: 0 Decrease Necrotic Tissue - Progress: Met Increase Granulation Tissue to: 100 Increase Granulation Tissue - Progress: Met Decrease Length/Width/Depth by (cm): 0 Decrease Length/Width/Depth - Progress: Met Improve Drainage Characteristics:  (scant) Improve Drainage Characteristics - Progress: Met Patient/Family will be able to : verbalize the importance of not scratching at wounds or bites. Patient/Family Instruction Goal - Progress: Met Additional Wound Therapy Goal:  decrease volume by 70% volume decreased by 64% Additional Wound Therapy Goal - Progress: Progressing toward goal  Problem List Patient Active Problem List   Diagnosis Date Noted  . S/P complete repair of rotator cuff 07/24/2012  . Pain in joint, shoulder region 06/26/2012  .  Muscle weakness (generalized) 06/26/2012  . Shoulder arthritis 02/08/2012  . Rotator cuff rupture, complete 02/08/2012  . Chest pain 09/30/2010  . Essential hypertension, benign 09/30/2010  . DIAB W/UNSPEC COMP TYPE II/UNSPEC TYPE UNCNTRL 09/05/2008  . OBESITY-MORBID (>100') 09/05/2008  . ESOPHAGEAL REFLUX 09/05/2008  . EDEMA 09/05/2008    GP Functional Assessment Tool Used: clinical judgement /swelling difference Functional Limitation: Other PT primary Other PT Primary Goal Status (Y6168): At least 1 percent but less than 20 percent impaired, limited or restricted Other PT Primary Discharge Status [phone]): At least 1 percent but less than 20 percent impaired, limited or restricted  RUSSELL,CINDY 12/06/2012, 10:13 AM   Your signature is required to indicate approval of the treatment plan as stated above.  Please sign and return making a copy for your files.  You may hard copy or send electronically.  Please check one: ___1.  Approve of this plan  ___2.  Approve of this plan with the following changes.   ____________________________                             _____________ Physician                                                                      Date

## 2013-02-19 ENCOUNTER — Other Ambulatory Visit: Payer: Self-pay | Admitting: Adult Health

## 2013-04-25 ENCOUNTER — Other Ambulatory Visit: Payer: Self-pay

## 2013-05-28 ENCOUNTER — Encounter (INDEPENDENT_AMBULATORY_CARE_PROVIDER_SITE_OTHER): Payer: Self-pay | Admitting: *Deleted

## 2013-06-19 ENCOUNTER — Other Ambulatory Visit: Payer: Self-pay | Admitting: Cardiovascular Disease

## 2013-07-22 ENCOUNTER — Encounter: Payer: Self-pay | Admitting: *Deleted

## 2013-07-22 ENCOUNTER — Telehealth: Payer: Self-pay | Admitting: *Deleted

## 2013-07-22 ENCOUNTER — Other Ambulatory Visit: Payer: Self-pay | Admitting: Cardiovascular Disease

## 2013-07-22 MED ORDER — DILTIAZEM HCL ER COATED BEADS 240 MG PO CP24: 240.0000 mg | ORAL_CAPSULE | Freq: Every day | ORAL | Status: DC

## 2013-07-22 NOTE — Telephone Encounter (Signed)
Called pt to advise that she can have one more refill ONLY, if she DOES NOT come for her March 19th apt then we WILL NOT be able to send any further refills, pt understood and noted she has recently lost her job and will defiantly come to her apt as advised, sent refill via escribe for 30 with 1 refill per pt will run out with only a 30 day supply per apt 19th of the month, pt understood all instructions

## 2013-07-22 NOTE — Telephone Encounter (Signed)
Pt has not been seen since 2012, She needs RX refill for Cardizem. We have filled this for her a couple times with out appt. She states she understands she needs to bne seen but she doesn't have the money. I have made her appt for 09/04/13 with Dr Domenic Polite she was not able to come this month. She is begging [place] to fill her Rx one more time.

## 2013-07-22 NOTE — Telephone Encounter (Signed)
May refill once more. She needs to followup for the visit in March however.

## 2013-07-22 NOTE — Telephone Encounter (Signed)
Please advise if ok to refill. 

## 2013-08-25 ENCOUNTER — Emergency Department (HOSPITAL_COMMUNITY): Payer: Medicare Other

## 2013-08-25 ENCOUNTER — Emergency Department (HOSPITAL_COMMUNITY)
Admission: EM | Admit: 2013-08-25 | Discharge: 2013-08-25 | Disposition: A | Discharge status: Home / Self Care | Payer: Medicare Other | Attending: Emergency Medicine | Admitting: Emergency Medicine

## 2013-08-25 ENCOUNTER — Encounter (HOSPITAL_COMMUNITY): Payer: Self-pay | Admitting: Emergency Medicine

## 2013-08-25 DIAGNOSIS — Z791 Long term (current) use of non-steroidal anti-inflammatories (NSAID): Secondary | ICD-10-CM | POA: Insufficient documentation

## 2013-08-25 DIAGNOSIS — F329 Major depressive disorder, single episode, unspecified: Secondary | ICD-10-CM | POA: Insufficient documentation

## 2013-08-25 DIAGNOSIS — R059 Cough, unspecified: Secondary | ICD-10-CM

## 2013-08-25 DIAGNOSIS — E119 Type 2 diabetes mellitus without complications: Secondary | ICD-10-CM | POA: Insufficient documentation

## 2013-08-25 DIAGNOSIS — R05 Cough: Secondary | ICD-10-CM

## 2013-08-25 DIAGNOSIS — Z85850 Personal history of malignant neoplasm of thyroid: Secondary | ICD-10-CM | POA: Insufficient documentation

## 2013-08-25 DIAGNOSIS — Z9889 Other specified postprocedural states: Secondary | ICD-10-CM | POA: Insufficient documentation

## 2013-08-25 DIAGNOSIS — Z880 Allergy status to penicillin: Secondary | ICD-10-CM | POA: Insufficient documentation

## 2013-08-25 DIAGNOSIS — Z7982 Long term (current) use of aspirin: Secondary | ICD-10-CM | POA: Insufficient documentation

## 2013-08-25 DIAGNOSIS — R51 Headache: Secondary | ICD-10-CM | POA: Insufficient documentation

## 2013-08-25 DIAGNOSIS — R609 Edema, unspecified: Secondary | ICD-10-CM | POA: Insufficient documentation

## 2013-08-25 DIAGNOSIS — M129 Arthropathy, unspecified: Secondary | ICD-10-CM | POA: Insufficient documentation

## 2013-08-25 DIAGNOSIS — Z792 Long term (current) use of antibiotics: Secondary | ICD-10-CM | POA: Insufficient documentation

## 2013-08-25 DIAGNOSIS — J449 Chronic obstructive pulmonary disease, unspecified: Secondary | ICD-10-CM | POA: Insufficient documentation

## 2013-08-25 DIAGNOSIS — F3289 Other specified depressive episodes: Secondary | ICD-10-CM | POA: Insufficient documentation

## 2013-08-25 DIAGNOSIS — K219 Gastro-esophageal reflux disease without esophagitis: Secondary | ICD-10-CM | POA: Insufficient documentation

## 2013-08-25 DIAGNOSIS — I10 Essential (primary) hypertension: Secondary | ICD-10-CM | POA: Insufficient documentation

## 2013-08-25 DIAGNOSIS — J4489 Other specified chronic obstructive pulmonary disease: Secondary | ICD-10-CM | POA: Insufficient documentation

## 2013-08-25 DIAGNOSIS — Z79899 Other long term (current) drug therapy: Secondary | ICD-10-CM | POA: Insufficient documentation

## 2013-08-25 DIAGNOSIS — R519 Headache, unspecified: Secondary | ICD-10-CM

## 2013-08-25 DIAGNOSIS — E039 Hypothyroidism, unspecified: Secondary | ICD-10-CM | POA: Insufficient documentation

## 2013-08-25 MED ORDER — SODIUM CHLORIDE 0.9 % IV BOLUS (SEPSIS)
1000.0000 mL | Freq: Once | INTRAVENOUS | Status: AC
  Administered 2013-08-25: 1000 mL via INTRAVENOUS

## 2013-08-25 MED ORDER — NAPROXEN 500 MG PO TABS: 500.0000 mg | ORAL_TABLET | Freq: Two times a day (BID) | ORAL | Status: DC

## 2013-08-25 MED ORDER — METOCLOPRAMIDE HCL 5 MG/ML IJ SOLN
10.0000 mg | Freq: Once | INTRAMUSCULAR | Status: AC
  Administered 2013-08-25: 10 mg via INTRAVENOUS
  Filled 2013-08-25: qty 2

## 2013-08-25 MED ORDER — DEXAMETHASONE SODIUM PHOSPHATE 10 MG/ML IJ SOLN
10.0000 mg | Freq: Once | INTRAMUSCULAR | Status: AC
  Administered 2013-08-25: 10 mg via INTRAVENOUS
  Filled 2013-08-25: qty 1

## 2013-08-25 MED ORDER — HYDROCODONE-ACETAMINOPHEN 5-325 MG PO TABS
2.0000 | ORAL_TABLET | Freq: Once | ORAL | Status: AC
  Administered 2013-08-25: 2 via ORAL
  Filled 2013-08-25: qty 2

## 2013-08-25 MED ORDER — KETOROLAC TROMETHAMINE 30 MG/ML IJ SOLN
30.0000 mg | Freq: Once | INTRAMUSCULAR | Status: AC
  Administered 2013-08-25: 30 mg via INTRAVENOUS
  Filled 2013-08-25: qty 1

## 2013-08-25 MED ORDER — HYDROCODONE-ACETAMINOPHEN 5-325 MG PO TABS: 2.0000 | ORAL_TABLET | ORAL | Status: DC | PRN

## 2013-08-25 NOTE — ED Provider Notes (Signed)
CSN: [id number]     Arrival date & time 08/25/13  0454 History   First MD Initiated Contact with Patient 08/25/13 0501     No chief complaint on file.    (Consider location/radiation/quality/duration/timing/severity/associated sxs/prior Treatment) HPI Comments: The pt is a 58 y/o female - hx of frequent headaches - states that her PMD has told her that she has been told that this is a migraine headache.  The headache that she has today is similar to prior headaches, it is slightly more severe but similar in location and quality. It is a throbbing headache located in the frontal and crown of the head, it does not radiate into the neck, it is associated with photophobia and nausea but she has not had any vomiting. She did have subjective fevers and chills prior to arrival and admits to having a coughing illness over the last week for which she is taking over-the-counter cough medicines and having some improvement. She has been coughing an excessive amount today. There is minimal production of the cough, no dysuria, no diarrhea, no rashes. Her headache started just after eating dinner this evening.  The history is provided by the patient.    Past Medical History  Diagnosis Date  . Type 2 diabetes mellitus   . Essential hypertension, benign   . GERD (gastroesophageal reflux disease)   . Edema     feet and legs  . Morbid obesity   . COPD (chronic obstructive pulmonary disease)   . Thyroid disease   . Asthma   . Arthritis   . Depression   . Cirrhosis of liver   . Hypothyroidism 2009    thyroid cancer  . Cancer     thyroid   Past Surgical History  Procedure Laterality Date  . Thyroidectomy  2009  . Cholecystectomy  1996  . Tonsillectomy  1958  . Foot surgery  2000    bone spur removed  . Abdominal hysterectomy  1985  . Exploratory laparotomy  2003  . Umbilical hernia repair  2010  . Debridement of abdominal wound  2011  . Cesarean section  1977  . Cardiac catheterization  2012  .  Resection distal clavical  05/07/2012    Procedure: RESECTION DISTAL CLAVICAL;  Surgeon: [name], MD;  Location: [facility];  Service: Orthopedics;  Laterality: Right;  . Shoulder open rotator cuff repair  05/07/2012    Procedure: ROTATOR CUFF REPAIR SHOULDER OPEN;  Surgeon: [name], MD;  Location: [facility];  Service: Orthopedics;  Laterality: Right;   Family History  Problem Relation Age of Onset  . Heart disease    . Arthritis    . Cancer    . Asthma    . Diabetes    . Kidney disease     History  Substance Use Topics  . Smoking status: Never Smoker   . Smokeless tobacco: Never Used  . Alcohol Use: No   OB History   Grav Para Term Preterm Abortions TAB SAB Ect Mult Living                 Review of Systems  All other systems reviewed and are negative.      Allergies  Adhesive; Percogesic; Clarithromycin; Clindamycin/lincomycin; Neosporin; and Penicillins  Home Medications   Current Outpatient Rx  Name  Route  Sig  Dispense  Refill  . albuterol (PROVENTIL HFA) 108 (90 BASE) MCG/ACT inhaler   Inhalation   Inhale 2 puffs into the lungs every 6 (six)  hours as needed. Shortness of breath         . aspirin 81 MG EC tablet   Oral   Take 81 mg by mouth daily.          [name] buPROPion (WELLBUTRIN SR) 150 MG 12 hr tablet   Oral   Take 150 mg by mouth 2 (two) times daily.          . CELEBREX 200 MG capsule   Oral   Take 200 mg by mouth daily.          . clobetasol ointment (TEMOVATE) 0.05 %   Topical   Apply 1 application topically daily as needed.         . colesevelam (WELCHOL) 625 MG tablet   Oral   Take 1,875 mg by mouth 2 (two) times daily with a meal. 3 tabs am 3 tabs pm         . diltiazem (CARDIZEM CD) 240 MG 24 hr capsule      TAKE ONE (1) CAPSULE EACH DAY   30 capsule   0     MUST make apt to see cardiologist for any further  ...   . DULoxetine (CYMBALTA) 60 MG capsule   Oral   Take 120 mg by mouth daily.          .  furosemide (LASIX) 80 MG tablet   Oral   Take 80 mg by mouth daily.          . insulin glargine (LANTUS SOLOSTAR) 100 UNIT/ML injection   Subcutaneous   Inject 15 Units into the skin daily.         [name] levothyroxine (SYNTHROID, LEVOTHROID) 300 MCG tablet   Oral   Take 300 mcg by mouth daily.           [name] linagliptin (TRADJENTA) 5 MG TABS tablet   Oral   Take 5 mg by mouth daily.         [name] liothyronine (CYTOMEL) 5 MCG tablet   Oral   Take 5 mcg by mouth daily.         [name] lisinopril (PRINIVIL,ZESTRIL) 40 MG tablet   Oral   Take 40 mg by mouth daily.         . metFORMIN (GLUCOPHAGE) 1000 MG tablet   Oral   Take 1,000 mg by mouth 2 (two) times daily with a meal.         . nystatin (NYSTOP) 100000 UNIT/GM POWD   Topical   Apply 100,000 g topically daily. Rash         . simvastatin (ZOCOR) 20 MG tablet   Oral   Take 20 mg by mouth daily.          . VOLTAREN 1 % GEL   Topical   Apply 4 g topically daily as needed. For pain         . cephALEXin (KEFLEX) 500 MG capsule   Oral   Take 1 capsule (500 mg total) by mouth 4 (four) times daily.   28 capsule   0   . diltiazem (CARDIZEM CD) 240 MG 24 hr capsule   Oral   Take 1 capsule (240 mg total) by mouth daily.   30 capsule   1   . HYDROcodone-acetaminophen (NORCO) 10-325 MG per tablet   Oral   Take 1 tablet by mouth every 6 (six) hours as needed for pain.   60 tablet   5   . HYDROcodone-acetaminophen (NORCO/VICODIN) 5-325  MG per tablet   Oral   Take 2 tablets by mouth every 4 (four) hours as needed.   10 tablet   0   . naproxen (NAPROSYN) 500 MG tablet   Oral   Take 1 tablet (500 mg total) by mouth 2 (two) times daily with a meal.   30 tablet   0   . ondansetron (ZOFRAN ODT) 4 MG disintegrating tablet   Oral   Take 1 tablet (4 mg total) by mouth every 8 (eight) hours as needed for nausea.   6 tablet   0    BP 148/95  Pulse 112  Temp(Src) 99 F (37.2 C) (Oral)  Resp 22  Ht 5' 5"   (1.651 m)  Wt 300 lb (136.079 kg)  BMI 49.92 kg/m2  SpO2 97% Physical Exam  Nursing note and vitals reviewed. Constitutional: She appears well-developed and well-nourished. No distress.  HENT:  Head: Normocephalic and atraumatic.  Mouth/Throat: Oropharynx is clear and moist. No oropharyngeal exudate.  Oropharynx is clear and moist, no exudate asymmetry hypertrophy or erythema. Nares are clear, no discharge or swelling of the turbinates. No sinus tenderness over the frontal or maxillary sinuses. No trismus or torticollis.  Eyes: Conjunctivae and EOM are normal. Pupils are equal, round, and reactive to light. Right eye exhibits no discharge. Left eye exhibits no discharge. No scleral icterus.  Neck: Normal range of motion. Neck supple. No JVD present. No thyromegaly present.  Cardiovascular: Normal rate, regular rhythm, normal heart sounds and intact distal pulses.  Exam reveals no gallop and no friction rub.   No murmur heard. Pulmonary/Chest: Effort normal and breath sounds normal. No respiratory distress. She has no wheezes. She has no rales.  Clear lung sounds, speaks in full sentences, no wheezing rales or rhonchi.  Abdominal: Soft. Bowel sounds are normal. She exhibits no distension and no mass. There is no tenderness.  Musculoskeletal: Normal range of motion. She exhibits no edema and no tenderness.  Lymphadenopathy:    She has no cervical adenopathy.  Neurological: She is alert. Coordination normal.  Normal speech, normal memory, coordination of all 4 extremities, normal strength and sensation  Skin: Skin is warm and dry. No rash noted. No erythema.  Psychiatric: She has a normal mood and affect. Her behavior is normal.    ED Course  Procedures (including critical care time) Labs Review Labs Reviewed - No data to display Imaging Review Dg Chest 2 View  08/25/2013   CLINICAL DATA:  Shortness of breath, cough, chest pain  EXAM: CHEST  2 VIEW  COMPARISON:  Prior radiograph from  11/08/2011  FINDINGS: The cardiac and mediastinal silhouettes are stable in size and contour, and remain within normal limits.  The lungs are normally inflated. Asymmetric opacity overlying the medial right upper lobe likely root related to summation of shadows with overlapping right first rib. No airspace consolidation, pleural effusion, or pulmonary edema is identified. There is no pneumothorax.  No acute osseous abnormality identified.  IMPRESSION: No active cardiopulmonary disease.   Electronically Signed   By: Jeannine Boga M.D.   On: 08/25/2013 05:51     EKG Interpretation None      MDM   Final diagnoses:  Headache  Cough    The patient has no focal neurologic deficits, has baseline neurologic exam for her, has vital signs which show no signs of fever or hypotension, despite coughing has no hypoxia and no abnormal lung sounds. Because of her fevers and chills which have been subjective at  home I will obtain a chest x-ray. She will be given intravenous medications in the form of a headache cocktail as below. Will recheck after medications and chest x-ray.  Pain has improved after medications, she does request one more dose of medication prior to discharge, 2 Vicodin will be given. The patient has no findings on chest x-ray to suggest pneumonia, she does appear stable for discharge.  Meds given in ED:  Medications  HYDROcodone-acetaminophen (NORCO/VICODIN) 5-325 MG per tablet 2 tablet (not administered)  ketorolac (TORADOL) 30 MG/ML injection 30 mg (30 mg Intravenous Given 08/25/13 0526)  metoCLOPramide (REGLAN) injection 10 mg (10 mg Intravenous Given 08/25/13 0526)  sodium chloride 0.9 % bolus 1,000 mL (0 mLs Intravenous Stopped 08/25/13 0615)  dexamethasone (DECADRON) injection 10 mg (10 mg Intravenous Given 08/25/13 0526)    New Prescriptions   HYDROCODONE-ACETAMINOPHEN (NORCO/VICODIN) 5-325 MG PER TABLET    Take 2 tablets by mouth every 4 (four) hours as needed.   NAPROXEN  (NAPROSYN) 500 MG TABLET    Take 1 tablet (500 mg total) by mouth 2 (two) times daily with a meal.        Johnna Acosta, MD 08/25/13 [phone]

## 2013-08-25 NOTE — Discharge Instructions (Signed)
Your xray showed no signs of pneumonia - please use the pain medicines as prescribed - read the attached instructions and find a quiet and dark room to rest today - eat small meals and stay hydrated.   Headache:  You are having a headache. No specific cause was found today for your headache. It may have been a migraine or other cause of headache. Stress, anxiety, fatigue, and depression are common triggers for headaches. Your headache today does not appear to be life-threatening or require hospitalization, but often the exact cause of headaches is not determined in the emergency department. Therefore, followup with your doctor is very important to find out what may have caused your headache, and whether or not you need any further diagnostic testing or treatment. Sometimes headaches can appear benign but then more serious symptoms can develop which should prompt an immediate reevaluation by your doctor or the emergency department.  Seek immediate medical attention if:   You develop possible problems with medications prescribed.  The medications don't resolve your headache, if it recurs, or if you have multiple episodes of vomiting or can't take fluids by mouth  You have a change from the usual headache.  If you developed a sudden severe headache or confusion, become poorly responsive or faint, developed a fever above 100.4 or problems breathing, have a change in speech, vision, swallowing or understanding, or developed new weakness, numbness, tingling, incoordination or have a seizure.  If you don't have a family doctor to follow up with, see the follow up list below - call this morning for a follow-up appointment in the next 1-2 days.

## 2013-08-25 NOTE — ED Notes (Signed)
Pt c/o frontal and occipital headache since after supper last night, states she is also nauseated and feels like she has a fever.

## 2013-09-05 ENCOUNTER — Encounter: Payer: Self-pay | Admitting: Cardiology

## 2013-09-05 ENCOUNTER — Ambulatory Visit (INDEPENDENT_AMBULATORY_CARE_PROVIDER_SITE_OTHER): Payer: Medicare Other | Admitting: Cardiology

## 2013-09-05 VITALS — BP 188/58 | HR 103 | Ht 65.0 in | Wt 304.0 lb

## 2013-09-05 DIAGNOSIS — E119 Type 2 diabetes mellitus without complications: Secondary | ICD-10-CM

## 2013-09-05 DIAGNOSIS — I10 Essential (primary) hypertension: Secondary | ICD-10-CM

## 2013-09-05 DIAGNOSIS — R072 Precordial pain: Secondary | ICD-10-CM

## 2013-09-05 NOTE — Assessment & Plan Note (Signed)
Blood pressure and heart rate were up today, patient stated that she was nervous. No symptomatology associated with this. She reports compliance with her medications. Recommend keep follow with Dr. Orson Ape. Cardizem CD dose could be increased if necessary.

## 2013-09-05 NOTE — Assessment & Plan Note (Signed)
Keep followup with Dr. Orson Ape.

## 2013-09-05 NOTE — Progress Notes (Signed)
Clinical Summary Ms. Ruberg is a 58 y.o.female last seen in the office by Ms. Lawrence NP in September 2013. Patient contacted the office in February for refill of Cardizem, had not kept Interval followup visits however. She was given a refill but it was explained that she would need to keep her visit today.  She denies any recurring chest pain symptoms. States that she has been under stress, her mother was recently placed in a nursing home, she is also retired from her prior job as a Training and development officer at [employer].  Lexiscan Myoview from April 2012 suggested probable variable breast attenuation, less likely ischemia anteriorly, LVEF 67%. She did ultimately undergo a cardiac catheterization to clarify this issue in May 2012, and had no evidence of significant obstructive CAD with only mild coronary calcification noted. ECG today showed sinus tachycardia (patient stated she was nervous), IVCD with repolarization abnormalities.  She reports compliance with her medications. States that she has three-month followup with her primary care provider.   Allergies  Allergen Reactions  . Adhesive [Tape] Other (See Comments)    Blisters skin  . Percogesic [Diphenhydramine-Acetaminophen]   . Clarithromycin Rash    Yeast Infection  . Clindamycin/Lincomycin Rash    Yeast Infection   . Neosporin [Neomycin-Polymyxin B Gu] Rash    Eyes Drops only  . Penicillins Rash    Current Outpatient Prescriptions  Medication Sig Dispense Refill  . albuterol (PROVENTIL HFA) 108 (90 BASE) MCG/ACT inhaler Inhale 2 puffs into the lungs every 6 (six) hours as needed. Shortness of breath      . aspirin 81 MG EC tablet Take 81 mg by mouth daily.       [name] buPROPion (WELLBUTRIN SR) 150 MG 12 hr tablet Take 150 mg by mouth 2 (two) times daily.       . CELEBREX 200 MG capsule Take 200 mg by mouth daily.       . clobetasol ointment (TEMOVATE) 1.61 % Apply 1 application topically daily as needed.      . colesevelam  (WELCHOL) 625 MG tablet Take 1,875 mg by mouth 2 (two) times daily with a meal. 3 tabs am 3 tabs pm      . diltiazem (CARDIZEM CD) 240 MG 24 hr capsule TAKE ONE (1) CAPSULE EACH DAY  30 capsule  0  . diltiazem (CARDIZEM CD) 240 MG 24 hr capsule Take 1 capsule (240 mg total) by mouth daily.  30 capsule  1  . DULoxetine (CYMBALTA) 60 MG capsule Take 120 mg by mouth daily.       . furosemide (LASIX) 80 MG tablet Take 80 mg by mouth daily.       [name] HYDROcodone-acetaminophen (NORCO/VICODIN) 5-325 MG per tablet Take 2 tablets by mouth every 4 (four) hours as needed.  10 tablet  0  . insulin glargine (LANTUS SOLOSTAR) 100 UNIT/ML injection Inject 15 Units into the skin daily.      [name] levothyroxine (SYNTHROID, LEVOTHROID) 300 MCG tablet Take 300 mcg by mouth daily.        [name] linagliptin (TRADJENTA) 5 MG TABS tablet Take 5 mg by mouth daily.      [name] liothyronine (CYTOMEL) 5 MCG tablet Take 5 mcg by mouth daily.      [name] lisinopril (PRINIVIL,ZESTRIL) 40 MG tablet Take 40 mg by mouth daily.      . metFORMIN (GLUCOPHAGE) 1000 MG tablet Take 1,000 mg by mouth 2 (two) times daily with a meal.      .  naproxen (NAPROSYN) 500 MG tablet Take 1 tablet (500 mg total) by mouth 2 (two) times daily with a meal.  30 tablet  0  . nystatin (NYSTOP) 100000 UNIT/GM POWD Apply 100,000 g topically daily. Rash      . ondansetron (ZOFRAN ODT) 4 MG disintegrating tablet Take 1 tablet (4 mg total) by mouth every 8 (eight) hours as needed for nausea.  6 tablet  0  . simvastatin (ZOCOR) 20 MG tablet Take 20 mg by mouth daily.       . VOLTAREN 1 % GEL Apply 4 g topically daily as needed. For pain       No current facility-administered medications for this visit.    Past Medical History  Diagnosis Date  . Type 2 diabetes mellitus   . Essential hypertension, benign   . GERD (gastroesophageal reflux disease)   . Edema   . Morbid obesity   . COPD (chronic obstructive pulmonary disease)   . Asthma   . Arthritis   . Depression   .  Cirrhosis of liver   . Hypothyroidism   . Thyroid cancer     Past Surgical History  Procedure Laterality Date  . Thyroidectomy  2009  . Cholecystectomy  1996  . Tonsillectomy  1958  . Foot surgery  2000    Bone spur removed  . Abdominal hysterectomy  1985  . Exploratory laparotomy  2003  . Umbilical hernia repair  2010  . Debridement of abdominal wound  2011  . Cesarean section  1977  . Cardiac catheterization  2012  . Resection distal clavical  05/07/2012    Procedure: RESECTION DISTAL CLAVICAL;  Surgeon: [name], MD;  Location: [facility];  Service: Orthopedics;  Laterality: Right;  . Shoulder open rotator cuff repair  05/07/2012    Procedure: ROTATOR CUFF REPAIR SHOULDER OPEN;  Surgeon: [name], MD;  Location: [facility];  Service: Orthopedics;  Laterality: Right;    Family History  Problem Relation Age of Onset  . Heart disease    . Arthritis    . Cancer    . Asthma    . Diabetes    . Kidney disease      Social History Ms. Ferrucci reports that she has never smoked. She has never used smokeless tobacco. Ms. Uyeno reports that she does not drink alcohol.  Review of Systems No palpitations or syncope. No orthopnea or PND. States that her sleep cycle has been messed up somewhat, fatigue. Trying to lose weight through cutting back portion size. She has lost nearly 50 pounds over time. Otherwise negative.  Physical Examination Filed Vitals:   09/05/13 0818  BP: 188/58  Pulse: 103   Filed Weights   09/05/13 0818  Weight: 304 lb (137.893 kg)    Morbidly obese woman in no acute distress.  HEENT: Conjunctiva and lids normal, oropharynx clear.  Neck: Increased girth, no obvious elevated JVP or bruits, no thyromegaly.  Lungs: Clear auscultation, nonlabored, no wheezing.  Cardiac: Distant regular heart sounds, no significant murmur or gallop.  Abdomen: Morbidly obese, bowel sounds present, no tenderness or guarding.  Skin: Warm and dry. Some  excoriations on her legs. Musculoskeletal: No kyphosis.  Extremities: No pitting edema, distal pulses one plus.  Neuropsychiatric: Alert and oriented x3, affect appropriate.   Problem List and Plan   Precordial pain This has not been a recurrent issue. Cardiac workup from 2012 including cardiac catheterization was overall reassuring. Recommend continued risk factor modification including weight loss, management  of diabetes and hypertension. Keep interval followup with Dr. Orson Ape.  Essential hypertension, benign Blood pressure and heart rate were up today, patient stated that she was nervous. No symptomatology associated with this. She reports compliance with her medications. Recommend keep follow with Dr. Orson Ape. Cardizem CD dose could be increased if necessary.  Type 2 diabetes mellitus Keep followup with Dr. Orson Ape.    Satira Sark, M.D., F.A.C.C.

## 2013-09-05 NOTE — Patient Instructions (Signed)
Your physician wants you to follow-up in: 1 year You will receive a reminder letter in the mail two months in advance. If you don't receive a letter, please call our office to schedule the follow-up appointment.    Your physician recommends that you continue on your current medications as directed. Please refer to the Current Medication list given to you today.     Thank you for choosing [facility] !

## 2013-09-05 NOTE — Assessment & Plan Note (Signed)
This has not been a recurrent issue. Cardiac workup from 2012 including cardiac catheterization was overall reassuring. Recommend continued risk factor modification including weight loss, management of diabetes and hypertension. Keep interval followup with Dr. Orson Ape.

## 2013-09-18 ENCOUNTER — Other Ambulatory Visit: Payer: Self-pay | Admitting: Cardiology

## 2014-01-20 ENCOUNTER — Ambulatory Visit (HOSPITAL_COMMUNITY)
Admission: RE | Admit: 2014-01-20 | Discharge: 2014-01-20 | Disposition: A | Discharge status: Home / Self Care | Payer: Medicare Other | Source: Ambulatory Visit | Attending: Family Medicine | Admitting: Family Medicine

## 2014-01-20 ENCOUNTER — Other Ambulatory Visit (HOSPITAL_COMMUNITY): Payer: Self-pay | Admitting: Family Medicine

## 2014-01-20 DIAGNOSIS — R079 Chest pain, unspecified: Secondary | ICD-10-CM | POA: Insufficient documentation

## 2014-01-20 DIAGNOSIS — M25519 Pain in unspecified shoulder: Secondary | ICD-10-CM | POA: Insufficient documentation

## 2014-01-20 DIAGNOSIS — M25512 Pain in left shoulder: Secondary | ICD-10-CM

## 2014-04-24 ENCOUNTER — Other Ambulatory Visit (HOSPITAL_COMMUNITY): Payer: Self-pay | Admitting: Family Medicine

## 2014-04-24 DIAGNOSIS — Z1231 Encounter for screening mammogram for malignant neoplasm of breast: Secondary | ICD-10-CM

## 2014-04-24 DIAGNOSIS — Z139 Encounter for screening, unspecified: Secondary | ICD-10-CM

## 2014-04-30 ENCOUNTER — Ambulatory Visit (HOSPITAL_COMMUNITY)
Admission: RE | Admit: 2014-04-30 | Discharge: 2014-04-30 | Disposition: A | Discharge status: Home / Self Care | Payer: Medicare Other | Source: Ambulatory Visit | Attending: Family Medicine | Admitting: Family Medicine

## 2014-04-30 ENCOUNTER — Ambulatory Visit (HOSPITAL_COMMUNITY): Payer: Medicare Other

## 2014-04-30 DIAGNOSIS — Z1231 Encounter for screening mammogram for malignant neoplasm of breast: Secondary | ICD-10-CM | POA: Insufficient documentation

## 2014-04-30 DIAGNOSIS — Z139 Encounter for screening, unspecified: Secondary | ICD-10-CM

## 2014-05-01 ENCOUNTER — Encounter (INDEPENDENT_AMBULATORY_CARE_PROVIDER_SITE_OTHER): Payer: Self-pay | Admitting: *Deleted

## 2014-05-28 ENCOUNTER — Encounter (INDEPENDENT_AMBULATORY_CARE_PROVIDER_SITE_OTHER): Payer: Self-pay | Admitting: *Deleted

## 2014-05-28 NOTE — H&P (Signed)
  NTS SOAP Note  Vital Signs:  Vitals as of: 46/02/5071: Systolic 257: Diastolic 84: Heart Rate 102: Temp 97.33F: Height 83f 4in: Weight 306Lbs 0 Ounces: Pain Level 4: BMI 52.52  BMI : 52.52 kg/m2  Subjective: This 58year old female presents forevaluation of a hernia.  Is present just above umbilicus,  where she has had a previous hernia repaired.  States it is getting larger in size and causing her discomfort.  No nausea,  vomiting.  Review of Symptoms:  Constitutional:fatigue Head:unremarkable Eyes:blurred vision bilateral sinus problems Cardiovascular:  unremarkable Respiratory:wheezing Gastrointestinheartburn, dyspepsia Genitourinary:frequency joint,  back,  and neck pain Skin:unremarkable Hematolgic/Lymphatic:unremarkable   hay fever   Past Medical History:  Reviewed  Past Medical History  Surgical History: T&A, C-sect, hysterectomy, Appy, rt oophorectomy, Bilateral foot, Umblical hernia. Thyroid. Medical Problems: DM2, hypothyroid, HTN, COPD, cirrohsis, neuropathy Psychiatric History: depression and anxiety Allergies: PCN, Biaxin, neosporin. Medications: welchol, synthroid, zocor, wellbutrin, metformin, lisinopril, lasix, celebrex, asa, cardiazem, xanax, ventolin, zaroxolyn, proventil, k-dur, oxybutynin, HCTZ, cymbalta   Social History:Reviewed  Social History  Preferred Language: English ([place]) Race:  White Ethnicity: Not Hispanic / Latino Age: [age]Years 4 Months Marital Status:  W Alcohol:  No Recreational drug(s):  No   Smoking Status: Never smoker reviewed on 05/27/2014 Functional Status reviewed on 05/27/2014 ------------------------------------------------ Bathing: Normal Cooking: Normal Dressing: Normal Driving: Normal Eating: Normal Managing Meds: Normal Oral Care: Normal Shopping: Normal Toileting: Normal Transferring: Normal Walking: Normal Cognitive Status reviewed on  05/27/2014 ------------------------------------------------ Attention: Normal Decision Making: Normal Language: Normal Memory: Normal Motor: Normal Perception: Normal Problem Solving: Normal Visual and Spatial: Normal   Family History:Reviewed  Family Health History Mother, Healthy;  Father, Healthy;     Objective Information: General:Well appearing, well nourished in no distress.Morbidly obese Heart:RRR, no murmur or gallop.  Normal S1, S2.  No S3, S4.  Lungs:  CTA bilaterally, no wheezes, rhonchi, rales.  Breathing unlabored. Reducible hernia just superior to umbilicus and below a surgical scar.    Assessment:Recurrant incisional hernia  Diagnoses: 553.21  K43.2 Recurrent ventral incisional hernia (Incisional hernia without obstruction or gangrene)  Procedures: 950518- OFFICE OUTPATIENT NEW 30 MINUTES    Plan:  Scheduled for recurrent incisional herniorrhaphy with mesh on 06/09/14.   Patient Education:Alternative treatments to surgery were discussed with patient (and family).  Risks and benefits  of procedure including bleeding,  infection,  and recurrence of the hernia were fully explained to the patient (and family) who gave informed consent. Patient/family questions were addressed.  Follow-up:Pending Surgery

## 2014-06-02 NOTE — Patient Instructions (Signed)
[name] [name]  06/02/2014   Your procedure is scheduled on:   06/09/2014  Report to [facility] at  47  AM.  Call this number if you have problems the morning of surgery: [phone]   Remember:   Do not eat food or drink liquids after midnight.   Take these medicines the morning of surgery with A SIP OF WATER:  Celebrex, dilt-xr, hydrocodone, wellbutrin, diltiazem, levothyroxine, liothyronine, lisinopril. Take your inhaler before you come and bring it with you. Only take 1/2 of your usual lantus dosage the night before. Take no medicine for your diabetes the morning of surgery.   Do not wear jewelry, make-up or nail polish.  Do not wear lotions, powders, or perfumes.   Do not shave 48 hours prior to surgery. Men may shave face and neck.  Do not bring valuables to the hospital.  [facility] is not responsible for any belongings or valuables.               Contacts, dentures or bridgework may not be worn into surgery.  Leave suitcase in the car. After surgery it may be brought to your room.  For patients admitted to the hospital, discharge time is determined by your treatment team.               Patients discharged the day of surgery will not be allowed to drive home.  Name and phone number of your driver: family  Special Instructions: Shower using CHG 2 nights before surgery and the night before surgery.  If you shower the day of surgery use CHG.  Use special wash - you have one bottle of CHG for all showers.  You should use approximately 1/3 of the bottle for each shower.   Please read over the following fact sheets that you were given: Pain Booklet, Coughing and Deep Breathing, Surgical Site Infection Prevention, Anesthesia Post-op Instructions and Care and Recovery After Surgery Hernia A hernia occurs when an internal organ pushes out through a weak spot in the abdominal wall. Hernias most commonly occur in the groin and around the navel. Hernias often can be pushed back  into place (reduced). Most hernias tend to get worse over time. Some abdominal hernias can get stuck in the opening (irreducible or incarcerated hernia) and cannot be reduced. An irreducible abdominal hernia which is tightly squeezed into the opening is at risk for impaired blood supply (strangulated hernia). A strangulated hernia is a medical emergency. Because of the risk for an irreducible or strangulated hernia, surgery may be recommended to repair a hernia. CAUSES   Heavy lifting.  Prolonged coughing.  Straining to have a bowel movement.  A cut (incision) made during an abdominal surgery. HOME CARE INSTRUCTIONS   Bed rest is not required. You may continue your normal activities.  Avoid lifting more than 10 pounds (4.5 kg) or straining.  Cough gently. If you are a smoker it is best to stop. Even the best hernia repair can break down with the continual strain of coughing. Even if you do not have your hernia repaired, a cough will continue to aggravate the problem.  Do not wear anything tight over your hernia. Do not try to keep it in with an outside bandage or truss. These can damage abdominal contents if they are trapped within the hernia sac.  Eat a normal diet.  Avoid constipation. Straining over long periods of time will increase hernia size and encourage breakdown of  repairs. If you cannot do this with diet alone, stool softeners may be used. SEEK IMMEDIATE MEDICAL CARE IF:   You have a fever.  You develop increasing abdominal pain.  You feel nauseous or vomit.  Your hernia is stuck outside the abdomen, looks discolored, feels hard, or is tender.  You have any changes in your bowel habits or in the hernia that are unusual for you.  You have increased pain or swelling around the hernia.  You cannot push the hernia back in place by applying gentle pressure while lying down. MAKE SURE YOU:   Understand these instructions.  Will watch your condition.  Will get help  right away if you are not doing well or get worse. Document Released: 06/06/2005 Document Revised: 08/29/2011 Document Reviewed: 01/24/2008 [facility] Patient Information 2015 [place], [place]. This information is not intended to replace advice given to you by your health care provider. Make sure you discuss any questions you have with your health care provider. PATIENT INSTRUCTIONS POST-ANESTHESIA  IMMEDIATELY FOLLOWING SURGERY:  Do not drive or operate machinery for the first twenty four hours after surgery.  Do not make any important decisions for twenty four hours after surgery or while taking narcotic pain medications or sedatives.  If you develop intractable nausea and vomiting or a severe headache please notify your doctor immediately.  FOLLOW-UP:  Please make an appointment with your surgeon as instructed. You do not need to follow up with anesthesia unless specifically instructed to do so.  WOUND CARE INSTRUCTIONS (if applicable):  Keep a dry clean dressing on the anesthesia/puncture wound site if there is drainage.  Once the wound has quit draining you may leave it open to air.  Generally you should leave the bandage intact for twenty four hours unless there is drainage.  If the epidural site drains for more than 36-48 hours please call the anesthesia department.  QUESTIONS?:  Please feel free to call your physician or the hospital operator if you have any questions, and they will be happy to assist you.

## 2014-06-03 ENCOUNTER — Ambulatory Visit (HOSPITAL_COMMUNITY)
Admit: 2014-06-03 | Discharge: 2014-06-03 | Disposition: A | Discharge status: Home / Self Care | Payer: Medicare Other | Source: Ambulatory Visit | Attending: Internal Medicine | Admitting: Internal Medicine

## 2014-06-04 ENCOUNTER — Other Ambulatory Visit: Payer: Self-pay

## 2014-06-04 ENCOUNTER — Encounter (HOSPITAL_COMMUNITY): Payer: Self-pay

## 2014-06-04 ENCOUNTER — Encounter (HOSPITAL_COMMUNITY)
Admission: RE | Admit: 2014-06-04 | Discharge: 2014-06-04 | Disposition: A | Discharge status: Home / Self Care | Payer: Medicare Other | Source: Ambulatory Visit | Attending: General Surgery | Admitting: General Surgery

## 2014-06-04 DIAGNOSIS — Z01818 Encounter for other preprocedural examination: Secondary | ICD-10-CM | POA: Diagnosis present

## 2014-06-04 HISTORY — DX: Anxiety disorder, unspecified: F41.9

## 2014-06-04 HISTORY — DX: Sleep apnea, unspecified: G47.30

## 2014-06-04 LAB — CBC WITH DIFFERENTIAL/PLATELET
BASOS PCT: 1 % (ref 0–1)
Basophils Absolute: 0 10*3/uL (ref 0.0–0.1)
EOS ABS: 0.1 10*3/uL (ref 0.0–0.7)
Eosinophils Relative: 2 % (ref 0–5)
HCT: 37 % (ref 36.0–46.0)
Hemoglobin: 11.5 g/dL — ABNORMAL LOW (ref 12.0–15.0)
Lymphocytes Relative: 27 % (ref 12–46)
Lymphs Abs: 1.6 10*3/uL (ref 0.7–4.0)
MCH: 27.1 pg (ref 26.0–34.0)
MCHC: 31.1 g/dL (ref 30.0–36.0)
MCV: 87.3 fL (ref 78.0–100.0)
Monocytes Absolute: 0.3 10*3/uL (ref 0.1–1.0)
Monocytes Relative: 4 % (ref 3–12)
NEUTROS PCT: 66 % (ref 43–77)
Neutro Abs: 3.8 10*3/uL (ref 1.7–7.7)
Platelets: 163 10*3/uL (ref 150–400)
RBC: 4.24 MIL/uL (ref 3.87–5.11)
RDW: 14.1 % (ref 11.5–15.5)
WBC: 5.8 10*3/uL (ref 4.0–10.5)

## 2014-06-04 LAB — BASIC METABOLIC PANEL
Anion gap: 12 (ref 5–15)
BUN: 9 mg/dL (ref 6–23)
CO2: 25 mEq/L (ref 19–32)
CREATININE: 0.65 mg/dL (ref 0.50–1.10)
Calcium: 9.2 mg/dL (ref 8.4–10.5)
Chloride: 98 mEq/L (ref 96–112)
GLUCOSE: 258 mg/dL — AB (ref 70–99)
Potassium: 4.3 mEq/L (ref 3.7–5.3)
Sodium: 135 mEq/L — ABNORMAL LOW (ref 137–147)

## 2014-06-09 ENCOUNTER — Ambulatory Visit (HOSPITAL_COMMUNITY): Payer: Medicare Other | Admitting: Anesthesiology

## 2014-06-09 ENCOUNTER — Encounter (HOSPITAL_COMMUNITY): Payer: Self-pay | Admitting: *Deleted

## 2014-06-09 ENCOUNTER — Encounter (HOSPITAL_COMMUNITY)
Admission: RE | Disposition: A | Discharge status: Home / Self Care | Payer: Self-pay | Source: Ambulatory Visit | Attending: General Surgery

## 2014-06-09 ENCOUNTER — Observation Stay (HOSPITAL_COMMUNITY)
Admission: RE | Admit: 2014-06-09 | Discharge: 2014-06-10 | Disposition: A | Discharge status: Home / Self Care | Payer: Medicare Other | Source: Ambulatory Visit | Attending: General Surgery | Admitting: General Surgery

## 2014-06-09 DIAGNOSIS — K219 Gastro-esophageal reflux disease without esophagitis: Secondary | ICD-10-CM | POA: Insufficient documentation

## 2014-06-09 DIAGNOSIS — Z791 Long term (current) use of non-steroidal anti-inflammatories (NSAID): Secondary | ICD-10-CM | POA: Insufficient documentation

## 2014-06-09 DIAGNOSIS — Z6841 Body Mass Index (BMI) 40.0 and over, adult: Secondary | ICD-10-CM | POA: Insufficient documentation

## 2014-06-09 DIAGNOSIS — G629 Polyneuropathy, unspecified: Secondary | ICD-10-CM | POA: Insufficient documentation

## 2014-06-09 DIAGNOSIS — K7460 Unspecified cirrhosis of liver: Secondary | ICD-10-CM | POA: Insufficient documentation

## 2014-06-09 DIAGNOSIS — G4730 Sleep apnea, unspecified: Secondary | ICD-10-CM | POA: Diagnosis not present

## 2014-06-09 DIAGNOSIS — F329 Major depressive disorder, single episode, unspecified: Secondary | ICD-10-CM | POA: Diagnosis not present

## 2014-06-09 DIAGNOSIS — E039 Hypothyroidism, unspecified: Secondary | ICD-10-CM | POA: Diagnosis not present

## 2014-06-09 DIAGNOSIS — E119 Type 2 diabetes mellitus without complications: Secondary | ICD-10-CM | POA: Diagnosis not present

## 2014-06-09 DIAGNOSIS — J449 Chronic obstructive pulmonary disease, unspecified: Secondary | ICD-10-CM | POA: Insufficient documentation

## 2014-06-09 DIAGNOSIS — Z7982 Long term (current) use of aspirin: Secondary | ICD-10-CM | POA: Insufficient documentation

## 2014-06-09 DIAGNOSIS — Z794 Long term (current) use of insulin: Secondary | ICD-10-CM | POA: Diagnosis not present

## 2014-06-09 DIAGNOSIS — F419 Anxiety disorder, unspecified: Secondary | ICD-10-CM | POA: Diagnosis not present

## 2014-06-09 DIAGNOSIS — K432 Incisional hernia without obstruction or gangrene: Secondary | ICD-10-CM | POA: Diagnosis not present

## 2014-06-09 DIAGNOSIS — J45909 Unspecified asthma, uncomplicated: Secondary | ICD-10-CM | POA: Insufficient documentation

## 2014-06-09 DIAGNOSIS — I10 Essential (primary) hypertension: Secondary | ICD-10-CM | POA: Diagnosis not present

## 2014-06-09 HISTORY — PX: INCISIONAL HERNIA REPAIR: SHX193

## 2014-06-09 HISTORY — PX: INSERTION OF MESH: SHX5868

## 2014-06-09 LAB — GLUCOSE, CAPILLARY
Glucose-Capillary: 148 mg/dL — ABNORMAL HIGH (ref 70–99)
Glucose-Capillary: 128 mg/dL — ABNORMAL HIGH (ref 70–99)
Glucose-Capillary: 125 mg/dL — ABNORMAL HIGH (ref 70–99)
Glucose-Capillary: 150 mg/dL — ABNORMAL HIGH (ref 70–99)
Glucose-Capillary: 172 mg/dL — ABNORMAL HIGH (ref 70–99)

## 2014-06-09 SURGERY — REPAIR, HERNIA, INCISIONAL
Anesthesia: General | Site: Abdomen

## 2014-06-09 MED ORDER — MIDAZOLAM HCL 2 MG/2ML IJ SOLN
INTRAMUSCULAR | Status: AC
  Filled 2014-06-09: qty 2

## 2014-06-09 MED ORDER — LACTATED RINGERS IV SOLN
INTRAVENOUS | Status: DC
  Administered 2014-06-09 – 2014-06-10 (×2): via INTRAVENOUS

## 2014-06-09 MED ORDER — LIDOCAINE HCL (CARDIAC) 10 MG/ML IV SOLN
INTRAVENOUS | Status: DC | PRN
  Administered 2014-06-09: 30 mg via INTRAVENOUS

## 2014-06-09 MED ORDER — SUCCINYLCHOLINE CHLORIDE 20 MG/ML IJ SOLN
INTRAMUSCULAR | Status: DC | PRN
  Administered 2014-06-09: 120 mg via INTRAVENOUS

## 2014-06-09 MED ORDER — COLESEVELAM HCL 625 MG PO TABS
1875.0000 mg | ORAL_TABLET | Freq: Two times a day (BID) | ORAL | Status: DC
  Administered 2014-06-09 – 2014-06-10 (×2): 1875 mg via ORAL
  Filled 2014-06-09 (×4): qty 3

## 2014-06-09 MED ORDER — CELECOXIB 100 MG PO CAPS
200.0000 mg | ORAL_CAPSULE | Freq: Every day | ORAL | Status: DC
  Administered 2014-06-09 – 2014-06-10 (×2): 200 mg via ORAL
  Filled 2014-06-09 (×2): qty 2

## 2014-06-09 MED ORDER — ACETAMINOPHEN 325 MG PO TABS: 650.0000 mg | ORAL_TABLET | Freq: Four times a day (QID) | ORAL | Status: DC | PRN

## 2014-06-09 MED ORDER — FENTANYL CITRATE 0.05 MG/ML IJ SOLN
25.0000 ug | INTRAMUSCULAR | Status: DC | PRN
  Administered 2014-06-09 (×2): 50 ug via INTRAVENOUS

## 2014-06-09 MED ORDER — ONDANSETRON HCL 4 MG/2ML IJ SOLN
4.0000 mg | Freq: Once | INTRAMUSCULAR | Status: AC
  Administered 2014-06-09: 4 mg via INTRAVENOUS

## 2014-06-09 MED ORDER — ONDANSETRON HCL 4 MG/2ML IJ SOLN: 4.0000 mg | Freq: Four times a day (QID) | INTRAMUSCULAR | Status: DC | PRN

## 2014-06-09 MED ORDER — VANCOMYCIN HCL 10 G IV SOLR
1500.0000 mg | INTRAVENOUS | Status: AC
  Administered 2014-06-09: 1500 mg via INTRAVENOUS

## 2014-06-09 MED ORDER — NEOSTIGMINE METHYLSULFATE 10 MG/10ML IV SOLN
INTRAVENOUS | Status: DC | PRN
  Administered 2014-06-09: 2 mg via INTRAVENOUS

## 2014-06-09 MED ORDER — DILTIAZEM HCL ER COATED BEADS 240 MG PO CP24
240.0000 mg | ORAL_CAPSULE | Freq: Every day | ORAL | Status: DC
  Administered 2014-06-10: 240 mg via ORAL
  Filled 2014-06-09 (×3): qty 1

## 2014-06-09 MED ORDER — ONDANSETRON HCL 4 MG/2ML IJ SOLN: 4.0000 mg | Freq: Once | INTRAMUSCULAR | Status: DC | PRN

## 2014-06-09 MED ORDER — METFORMIN HCL 500 MG PO TABS
1000.0000 mg | ORAL_TABLET | Freq: Two times a day (BID) | ORAL | Status: DC
  Administered 2014-06-09 – 2014-06-10 (×2): 1000 mg via ORAL
  Filled 2014-06-09 (×2): qty 2

## 2014-06-09 MED ORDER — GLYCOPYRROLATE 0.2 MG/ML IJ SOLN
INTRAMUSCULAR | Status: AC
  Filled 2014-06-09: qty 1

## 2014-06-09 MED ORDER — OXYBUTYNIN CHLORIDE ER 5 MG PO TB24
5.0000 mg | ORAL_TABLET | Freq: Every day | ORAL | Status: DC
  Administered 2014-06-09: 5 mg via ORAL
  Filled 2014-06-09: qty 1

## 2014-06-09 MED ORDER — MIDAZOLAM HCL 2 MG/2ML IJ SOLN
1.0000 mg | INTRAMUSCULAR | Status: DC | PRN
  Administered 2014-06-09: 2 mg via INTRAVENOUS

## 2014-06-09 MED ORDER — LACTATED RINGERS IV SOLN
INTRAVENOUS | Status: DC
  Administered 2014-06-09: 09:00:00 via INTRAVENOUS

## 2014-06-09 MED ORDER — FENTANYL CITRATE 0.05 MG/ML IJ SOLN
INTRAMUSCULAR | Status: AC
  Filled 2014-06-09: qty 5

## 2014-06-09 MED ORDER — BUPIVACAINE LIPOSOME 1.3 % IJ SUSP
INTRAMUSCULAR | Status: AC
  Filled 2014-06-09: qty 20

## 2014-06-09 MED ORDER — LIOTHYRONINE SODIUM 5 MCG PO TABS
5.0000 ug | ORAL_TABLET | Freq: Every day | ORAL | Status: DC
  Filled 2014-06-09 (×2): qty 1

## 2014-06-09 MED ORDER — GLYCOPYRROLATE 0.2 MG/ML IJ SOLN
INTRAMUSCULAR | Status: DC | PRN
  Administered 2014-06-09: 0.4 mg via INTRAVENOUS

## 2014-06-09 MED ORDER — FUROSEMIDE 80 MG PO TABS: 80.0000 mg | ORAL_TABLET | Freq: Every day | ORAL | Status: DC | PRN

## 2014-06-09 MED ORDER — ENOXAPARIN SODIUM 40 MG/0.4ML ~~LOC~~ SOLN
40.0000 mg | SUBCUTANEOUS | Status: DC
  Administered 2014-06-10: 40 mg via SUBCUTANEOUS
  Filled 2014-06-09: qty 0.4

## 2014-06-09 MED ORDER — KETOROLAC TROMETHAMINE 30 MG/ML IJ SOLN
INTRAMUSCULAR | Status: AC
  Filled 2014-06-09: qty 1

## 2014-06-09 MED ORDER — ALBUTEROL SULFATE (2.5 MG/3ML) 0.083% IN NEBU
2.5000 mg | INHALATION_SOLUTION | Freq: Four times a day (QID) | RESPIRATORY_TRACT | Status: DC | PRN
Start: 2014-06-09 — End: 2014-06-10

## 2014-06-09 MED ORDER — POVIDONE-IODINE 10 % OINT PACKET
TOPICAL_OINTMENT | CUTANEOUS | Status: DC | PRN
  Administered 2014-06-09: 1 via TOPICAL

## 2014-06-09 MED ORDER — KETOROLAC TROMETHAMINE 30 MG/ML IJ SOLN
30.0000 mg | Freq: Once | INTRAMUSCULAR | Status: AC
  Administered 2014-06-09: 30 mg via INTRAVENOUS

## 2014-06-09 MED ORDER — 0.9 % SODIUM CHLORIDE (POUR BTL) OPTIME
TOPICAL | Status: DC | PRN
  Administered 2014-06-09: 1000 mL

## 2014-06-09 MED ORDER — ROCURONIUM BROMIDE 100 MG/10ML IV SOLN
INTRAVENOUS | Status: DC | PRN
  Administered 2014-06-09: 20 mg via INTRAVENOUS
  Administered 2014-06-09: 5 mg via INTRAVENOUS

## 2014-06-09 MED ORDER — GLYCOPYRROLATE 0.2 MG/ML IJ SOLN
0.2000 mg | Freq: Once | INTRAMUSCULAR | Status: AC
  Administered 2014-06-09: 0.2 mg via INTRAVENOUS

## 2014-06-09 MED ORDER — NEOSTIGMINE METHYLSULFATE 10 MG/10ML IV SOLN
INTRAVENOUS | Status: AC
  Filled 2014-06-09: qty 1

## 2014-06-09 MED ORDER — FENTANYL CITRATE 0.05 MG/ML IJ SOLN
INTRAMUSCULAR | Status: DC | PRN
  Administered 2014-06-09 (×2): 50 ug via INTRAVENOUS
  Administered 2014-06-09: 100 ug via INTRAVENOUS
  Administered 2014-06-09: 50 ug via INTRAVENOUS

## 2014-06-09 MED ORDER — GLYCOPYRROLATE 0.2 MG/ML IJ SOLN
INTRAMUSCULAR | Status: AC
  Filled 2014-06-09: qty 7

## 2014-06-09 MED ORDER — PROPOFOL 10 MG/ML IV EMUL
INTRAVENOUS | Status: AC
  Filled 2014-06-09: qty 20

## 2014-06-09 MED ORDER — DULOXETINE HCL 60 MG PO CPEP
120.0000 mg | ORAL_CAPSULE | Freq: Every day | ORAL | Status: DC
  Administered 2014-06-10: 120 mg via ORAL
  Filled 2014-06-09: qty 2

## 2014-06-09 MED ORDER — LISINOPRIL 10 MG PO TABS
40.0000 mg | ORAL_TABLET | Freq: Every day | ORAL | Status: DC
  Administered 2014-06-10: 40 mg via ORAL
  Filled 2014-06-09: qty 4

## 2014-06-09 MED ORDER — LEVOTHYROXINE SODIUM 75 MCG PO TABS: 75.0000 ug | ORAL_TABLET | Freq: Every day | ORAL | Status: DC

## 2014-06-09 MED ORDER — POVIDONE-IODINE 10 % EX OINT
TOPICAL_OINTMENT | CUTANEOUS | Status: AC
  Filled 2014-06-09: qty 1

## 2014-06-09 MED ORDER — SUCCINYLCHOLINE CHLORIDE 20 MG/ML IJ SOLN
INTRAMUSCULAR | Status: AC
  Filled 2014-06-09: qty 2

## 2014-06-09 MED ORDER — LIDOCAINE HCL (PF) 1 % IJ SOLN
INTRAMUSCULAR | Status: AC
  Filled 2014-06-09: qty 5

## 2014-06-09 MED ORDER — FENTANYL CITRATE 0.05 MG/ML IJ SOLN
INTRAMUSCULAR | Status: AC
  Filled 2014-06-09: qty 2

## 2014-06-09 MED ORDER — BUPROPION HCL ER (SR) 150 MG PO TB12
150.0000 mg | ORAL_TABLET | Freq: Two times a day (BID) | ORAL | Status: DC
  Administered 2014-06-09 – 2014-06-10 (×2): 150 mg via ORAL
  Filled 2014-06-09 (×4): qty 1

## 2014-06-09 MED ORDER — PROPOFOL 10 MG/ML IV BOLUS
INTRAVENOUS | Status: DC | PRN
  Administered 2014-06-09: 150 mg via INTRAVENOUS
  Administered 2014-06-09: 50 mg via INTRAVENOUS

## 2014-06-09 MED ORDER — HYDROCODONE-ACETAMINOPHEN 5-325 MG PO TABS: 1.0000 | ORAL_TABLET | ORAL | Status: DC | PRN

## 2014-06-09 MED ORDER — CHLORHEXIDINE GLUCONATE 4 % EX LIQD: 1.0000 "application " | Freq: Once | CUTANEOUS | Status: DC

## 2014-06-09 MED ORDER — ONDANSETRON HCL 4 MG PO TABS: 4.0000 mg | ORAL_TABLET | Freq: Four times a day (QID) | ORAL | Status: DC | PRN

## 2014-06-09 MED ORDER — BUPIVACAINE LIPOSOME 1.3 % IJ SUSP
INTRAMUSCULAR | Status: DC | PRN
Start: 2014-06-09 — End: 2014-06-09
  Administered 2014-06-09: 20 mL

## 2014-06-09 MED ORDER — HYDROMORPHONE HCL 1 MG/ML IJ SOLN
1.0000 mg | INTRAMUSCULAR | Status: DC | PRN
  Administered 2014-06-09 – 2014-06-10 (×6): 1 mg via INTRAVENOUS
  Filled 2014-06-09 (×6): qty 1

## 2014-06-09 MED ORDER — ROCURONIUM BROMIDE 50 MG/5ML IV SOLN
INTRAVENOUS | Status: AC
  Filled 2014-06-09: qty 1

## 2014-06-09 MED ORDER — ONDANSETRON HCL 4 MG/2ML IJ SOLN
INTRAMUSCULAR | Status: AC
  Filled 2014-06-09: qty 2

## 2014-06-09 MED ORDER — INSULIN GLARGINE 100 UNIT/ML ~~LOC~~ SOLN
15.0000 [IU] | Freq: Every day | SUBCUTANEOUS | Status: DC
  Administered 2014-06-09: 15 [IU] via SUBCUTANEOUS
  Filled 2014-06-09 (×2): qty 0.15

## 2014-06-09 MED ORDER — LEVOTHYROXINE SODIUM 100 MCG PO TABS: 200.0000 ug | ORAL_TABLET | Freq: Every day | ORAL | Status: DC

## 2014-06-09 MED ORDER — INSULIN ASPART 100 UNIT/ML ~~LOC~~ SOLN
0.0000 [IU] | Freq: Three times a day (TID) | SUBCUTANEOUS | Status: DC
  Administered 2014-06-09 – 2014-06-10 (×2): 3 [IU] via SUBCUTANEOUS

## 2014-06-09 MED ORDER — LIDOCAINE HCL (PF) 1 % IJ SOLN
INTRAMUSCULAR | Status: AC
  Filled 2014-06-09: qty 10

## 2014-06-09 SURGICAL SUPPLY — 45 items
BAG HAMPER (MISCELLANEOUS) ×3 IMPLANT
CHLORAPREP W/TINT 26ML (MISCELLANEOUS) IMPLANT
CLOTH BEACON ORANGE TIMEOUT ST (SAFETY) ×3 IMPLANT
COVER LIGHT HANDLE STERIS (MISCELLANEOUS) ×6 IMPLANT
DECANTER SPIKE VIAL GLASS SM (MISCELLANEOUS) ×3 IMPLANT
ELECT REM PT RETURN 9FT ADLT (ELECTROSURGICAL) ×3
ELECTRODE REM PT RTRN 9FT ADLT (ELECTROSURGICAL) ×1 IMPLANT
FORMALIN 10 PREFIL 480ML (MISCELLANEOUS) IMPLANT
GAUZE SPONGE 4X4 12PLY STRL (GAUZE/BANDAGES/DRESSINGS) ×3 IMPLANT
GAUZE SPONGE 4X4 12PLY STRL LF (GAUZE/BANDAGES/DRESSINGS) ×3 IMPLANT
GLOVE BIOGEL PI IND STRL 7.0 (GLOVE) ×2 IMPLANT
GLOVE BIOGEL PI INDICATOR 7.0 (GLOVE) ×4
GLOVE ECLIPSE 6.5 STRL STRAW (GLOVE) ×3 IMPLANT
GLOVE EXAM NITRILE LRG STRL (GLOVE) ×3 IMPLANT
GLOVE OPTIFIT SS 6.5 STRL BRWN (GLOVE) ×3 IMPLANT
GLOVE SURG SS PI 7.5 STRL IVOR (GLOVE) ×3 IMPLANT
GOWN STRL REUS W/ TWL XL LVL3 (GOWN DISPOSABLE) ×1 IMPLANT
GOWN STRL REUS W/TWL LRG LVL3 (GOWN DISPOSABLE) ×6 IMPLANT
GOWN STRL REUS W/TWL XL LVL3 (GOWN DISPOSABLE) ×2
INST SET MAJOR GENERAL (KITS) ×3 IMPLANT
KIT ROOM TURNOVER APOR (KITS) ×3 IMPLANT
MANIFOLD NEPTUNE II (INSTRUMENTS) ×3 IMPLANT
MESH VENTRALEX ST 8CM LRG (Mesh General) ×3 IMPLANT
NEEDLE HYPO 25X1 1.5 SAFETY (NEEDLE) ×3 IMPLANT
NS IRRIG 1000ML POUR BTL (IV SOLUTION) ×3 IMPLANT
PACK ABDOMINAL MAJOR (CUSTOM PROCEDURE TRAY) ×3 IMPLANT
PAD ARMBOARD 7.5X6 YLW CONV (MISCELLANEOUS) ×3 IMPLANT
SET BASIN LINEN APH (SET/KITS/TRAYS/PACK) ×3 IMPLANT
SOL PREP PROV IODINE SCRUB 4OZ (MISCELLANEOUS) ×3 IMPLANT
STAPLER VISISTAT (STAPLE) IMPLANT
STAPLER VISISTAT 35W (STAPLE) ×3 IMPLANT
SUT ETHIBOND NAB MO 7 #0 18IN (SUTURE) IMPLANT
SUT NOVA NAB GS-21 1 T12 (SUTURE) IMPLANT
SUT NOVA NAB GS-22 2 2-0 T-19 (SUTURE) IMPLANT
SUT NOVA NAB GS-26 0 60 (SUTURE) IMPLANT
SUT PROLENE 0 CT 1 CR/8 (SUTURE) ×6 IMPLANT
SUT SILK 2 0 (SUTURE)
SUT SILK 2-0 18XBRD TIE 12 (SUTURE) IMPLANT
SUT VIC AB 2-0 CT1 27 (SUTURE) ×2
SUT VIC AB 2-0 CT1 TAPERPNT 27 (SUTURE) ×1 IMPLANT
SUT VIC AB 3-0 SH 27 (SUTURE) ×2
SUT VIC AB 3-0 SH 27X BRD (SUTURE) ×1 IMPLANT
SUT VIC AB 4-0 PS2 27 (SUTURE) IMPLANT
SYR 20CC LL (SYRINGE) ×3 IMPLANT
TAPE PAPER 3X10 WHT MICROPORE (GAUZE/BANDAGES/DRESSINGS) ×3 IMPLANT

## 2014-06-09 NOTE — Anesthesia Procedure Notes (Signed)
Procedure Name: Intubation Date/Time: 06/09/2014 9:31 AM Performed by: Vista Deck Pre-anesthesia Checklist: Patient identified, Patient being monitored, Timeout performed, Emergency Drugs available and Suction available Patient Re-evaluated:Patient Re-evaluated prior to inductionOxygen Delivery Method: Circle System Utilized Preoxygenation: Pre-oxygenation with 100% oxygen Intubation Type: IV induction, Rapid sequence and Cricoid Pressure applied Laryngoscope Size: Miller and 2 Grade View: Grade I Tube type: Oral Tube size: 7.0 mm Number of attempts: 1 Airway Equipment and Method: stylet Placement Confirmation: ETT inserted through vocal cords under direct vision,  positive ETCO2 and breath sounds checked- equal and bilateral Secured at: 21 cm Tube secured with: Tape Dental Injury: Teeth and Oropharynx as per pre-operative assessment

## 2014-06-09 NOTE — Transfer of Care (Signed)
Immediate Anesthesia Transfer of Care Note  Patient: [name] [name]  Procedure(s) Performed: Procedure(s): RECURRENT  INCISIONAL HERNIORRHAPHY WITH MESH (N/A) INSERTION OF MESH (N/A)  Patient Location: PACU  Anesthesia Type:General  Level of Consciousness: awake, alert  and patient cooperative  Airway & Oxygen Therapy: Patient Spontanous Breathing and non-rebreather face mask  Post-op Assessment: Report given to PACU RN, Post -op Vital signs reviewed and stable and Patient moving all extremities  Post vital signs: Reviewed and stable  Complications: No apparent anesthesia complications

## 2014-06-09 NOTE — Progress Notes (Signed)
Patient using incentive spirometer and started coughing. Patient felt a pop at incision. Dr. Arnoldo Morale notified. NO new orders at this time.

## 2014-06-09 NOTE — Anesthesia Postprocedure Evaluation (Signed)
  Anesthesia Post-op Note  Patient: [name] [name]  Procedure(s) Performed: Procedure(s): RECURRENT  INCISIONAL HERNIORRHAPHY WITH MESH (N/A) INSERTION OF MESH (N/A)  Patient Location: PACU  Anesthesia Type:General  Level of Consciousness: awake, alert  and patient cooperative  Airway and Oxygen Therapy: Patient Spontanous Breathing  Post-op Pain: moderate  Post-op Assessment: Post-op Vital signs reviewed, Patient's Cardiovascular Status Stable, Respiratory Function Stable and Patent Airway  Post-op Vital Signs: Reviewed and stable    Complications: No apparent anesthesia complications

## 2014-06-09 NOTE — Anesthesia Preprocedure Evaluation (Addendum)
Anesthesia Evaluation  Patient identified by MRN, date of birth, ID band  Reviewed: Allergy & Precautions, H&P , NPO status , Patient's Chart, lab work & pertinent test results  Airway Mallampati: I  TM Distance: >3 FB     Dental  (+) Teeth Intact   Pulmonary asthma , sleep apnea , COPD breath sounds clear to auscultation        Cardiovascular hypertension, Pt. on medications Rhythm:Regular Rate:Normal     Neuro/Psych PSYCHIATRIC DISORDERS Anxiety Depression    GI/Hepatic GERD-  Medicated,  Endo/Other  diabetes, Well Controlled, Type 2, Insulin DependentHypothyroidism   Renal/GU      Musculoskeletal  (+) Arthritis -,   Abdominal   Peds  Hematology   Anesthesia Other Findings   Reproductive/Obstetrics                            Anesthesia Physical Anesthesia Plan  ASA: III  Anesthesia Plan: General   Post-op Pain Management:    Induction: Intravenous, Rapid sequence and Cricoid pressure planned  Airway Management Planned: Oral ETT  Additional Equipment:   Intra-op Plan:   Post-operative Plan: Extubation in OR  Informed Consent: I have reviewed the patients History and Physical, chart, labs and discussed the procedure including the risks, benefits and alternatives for the proposed anesthesia with the patient or authorized representative who has indicated his/her understanding and acceptance.     Plan Discussed with:   Anesthesia Plan Comments:         Anesthesia Quick Evaluation

## 2014-06-09 NOTE — Progress Notes (Signed)
Pharmacy called to say that they do not carry patients cytomel or then name brand synthroid as patient is required to take. Patient asked to bring medication from home

## 2014-06-09 NOTE — Progress Notes (Signed)
Dressing to abdomen clean dry and intact at this time. Patient stated incision is feeling much better at this time.

## 2014-06-09 NOTE — Op Note (Signed)
Patient:  [name] [name]  DOB:  October 03, 1955  MRN:  [id number]   Preop Diagnosis:  Recurrent incisional hernia  Postop Diagnosis:  Same  Procedure:  Recurrent incisional herniorrhaphy with mesh  Surgeon:  Aviva Signs, M.D.  Anes:  Gen. endotracheal  Indications:  Patient is a 58 year old white female with multiple medical problems who presents with a Recurrent incisional hernia. She has had this previously repaired in the past, now presents with increasing swelling and pain in the supraumbilical region posterior to a surgical scar. The risks and benefits of the procedure including bleeding, infection, cardiopulmonary difficulties, and the possibility of recurrence of the hernia were fully explained to the patient, who gave informed consent.  Procedure note:  The patient is placed the supine position. After induction of general endotracheal anesthesia, the abdomen was prepped and draped using usual sterile technique with DuraPrep. Surgical site confirmation was performed.  The patient had a hypertrophic surgical scar present superior to the umbilicus. This was excised and disposed of. The dissection was taken down to the hernia sac and fascial wall. Laxity of the previously placed mesh was noted. The peritoneal cavity was entered into without difficulty. Omental attachments to the previously placed mesh and anterior abdominal wall were freed away using Bovie electrocautery. The actual defect was ovoid in nature and approximately 6 cm in its greatest diameter. An 8 cm Bard Ventralex ST patch was then inserted and secured to the fascia using 0 Prolene interrupted sutures. The overlying fascia with mesh that was in place was closed using 0 Prolene interrupted sutures. The subcutaneous layer was reapproximated using 2-0 Vicryl interrupted sutures. The skin was closed using staples. Exparel was instilled the surrounding wound. Betadine ointment and dry sterile dressing were applied.  All tape and  needle counts were correct at the end of the procedure. The patient was extubated in the operating room and transferred to PACU in stable condition.  Complications:  None  EBL:  Minimal  Specimen:  None

## 2014-06-09 NOTE — Progress Notes (Signed)
Patient had some bleeding after episode of popping. Dr. Arnoldo Morale notified of this and gave verbal permission to change post op dressing. Dressing clean dry and intact at this time.

## 2014-06-09 NOTE — Interval H&P Note (Signed)
History and Physical Interval Note:  06/09/2014 8:55 AM  [name] [name]  has presented today for surgery, with the diagnosis of recurrent incisional hernia  The various methods of treatment have been discussed with the patient and family. After consideration of risks, benefits and other options for treatment, the patient has consented to  Procedure(s): RECURRENT HERNIA REPAIR INCISIONAL WITH MESH (N/A) as a surgical intervention .  The patient's history has been reviewed, patient examined, no change in status, stable for surgery.  I have reviewed the patient's chart and labs.  Questions were answered to the patient's satisfaction.     Aviva Signs A

## 2014-06-10 ENCOUNTER — Encounter (HOSPITAL_COMMUNITY): Payer: Self-pay | Admitting: General Surgery

## 2014-06-10 DIAGNOSIS — K432 Incisional hernia without obstruction or gangrene: Secondary | ICD-10-CM | POA: Diagnosis not present

## 2014-06-10 LAB — BASIC METABOLIC PANEL
Anion gap: 5 (ref 5–15)
BUN: 10 mg/dL (ref 6–23)
CALCIUM: 8.8 mg/dL (ref 8.4–10.5)
CO2: 28 mmol/L (ref 19–32)
CREATININE: 0.67 mg/dL (ref 0.50–1.10)
Chloride: 101 mEq/L (ref 96–112)
GFR calc Af Amer: >90 mL/min (ref >90)
GLUCOSE: 156 mg/dL — AB (ref 70–99)
Potassium: 4.5 mmol/L (ref 3.5–5.1)
SODIUM: 134 mmol/L — AB (ref 135–145)

## 2014-06-10 LAB — CBC
HCT: 36 % (ref 36.0–46.0)
Hemoglobin: 11.5 g/dL — ABNORMAL LOW (ref 12.0–15.0)
MCH: 27.9 pg (ref 26.0–34.0)
MCHC: 31.9 g/dL (ref 30.0–36.0)
MCV: 87.4 fL (ref 78.0–100.0)
Platelets: 182 10*3/uL (ref 150–400)
RBC: 4.12 MIL/uL (ref 3.87–5.11)
RDW: 14.3 % (ref 11.5–15.5)
WBC: 9.3 10*3/uL (ref 4.0–10.5)

## 2014-06-10 LAB — HEMOGLOBIN A1C
Hgb A1c MFr Bld: 6.5 % — ABNORMAL HIGH (ref <5.7)
MEAN PLASMA GLUCOSE: 140 mg/dL — AB (ref <117)

## 2014-06-10 LAB — GLUCOSE, CAPILLARY
Glucose-Capillary: 147 mg/dL — ABNORMAL HIGH (ref 70–99)
Glucose-Capillary: 115 mg/dL — ABNORMAL HIGH (ref 70–99)

## 2014-06-10 MED ORDER — HYDROCODONE-ACETAMINOPHEN 5-325 MG PO TABS: 1.0000 | ORAL_TABLET | Freq: Four times a day (QID) | ORAL | Status: DC | PRN

## 2014-06-10 NOTE — Addendum Note (Signed)
Addendum  created 06/10/14 1102 by Charmaine Downs, CRNA   Modules edited: Notes Section   Notes Section:  File: [id number]

## 2014-06-10 NOTE — Discharge Instructions (Signed)
Open Hernia Repair, Care After Refer to this sheet in the next few weeks. These instructions provide you with information on caring for yourself after your procedure. Your health care provider may also give you more specific instructions. Your treatment has been planned according to current medical practices, but problems sometimes occur. Call your health care provider if you have any problems or questions after your procedure. WHAT TO EXPECT AFTER THE PROCEDURE After your procedure, it is typical to have the following:  Pain in your abdomen, especially along your incision. You will be given pain medicines to control the pain.  Constipation. You may be given a stool softener to help prevent this. HOME CARE INSTRUCTIONS   Only take over-the-counter or prescription medicines as directed by your health care provider.  Keep the wound dry and clean. You may wash the wound gently with soap and water 48 hours after surgery. Gently blot or dab the wound dry. Do not take baths, use swimming pools, or use hot tubs for 10 days or until your health care provider approves.  Change bandages (dressings) as directed by your health care provider.  Continue your normal diet as directed by your health care provider. Eat plenty of fruits and vegetables to help prevent constipation.  Drink enough fluids to keep your urine clear or pale yellow. This also helps prevent constipation.  Do not drive until your health care provider says it is okay.  Do not lift anything heavier than 10 pounds (4.5 kg) or play contact sports for 4 weeks or until your health care provider approves.  Follow up with your health care provider as directed. Ask your health care provider when to make an appointment to have your stitches (sutures) or staples removed. SEEK MEDICAL CARE IF:   You have increased bleeding coming from the incision site.  You have blood in your stool.  You have increasing pain in the wound.  You see redness  or swelling in the wound.  You have fluid (pus) coming from the wound.  You have a fever.  You notice a bad smell coming from the wound or dressing. SEEK IMMEDIATE MEDICAL CARE IF:   You develop a rash.  You have chest pain or shortness of breath.  You feel lightheaded or feel faint. Document Released: 12/24/2004 Document Revised: 03/27/2013 Document Reviewed: 01/16/2013 [facility] Patient Information 2015 [place], [place]. This information is not intended to replace advice given to you by your health care provider. Make sure you discuss any questions you have with your health care provider.

## 2014-06-10 NOTE — Progress Notes (Signed)
[name] [name] discharged home per MD order.  Discharge instructions reviewed and discussed with the patient, all questions and concerns answered. Copy of instructions and scripts given to patient.    Medication List    TAKE these medications        aspirin 81 MG EC tablet  Take 81 mg by mouth daily.     buPROPion 150 MG 12 hr tablet  Commonly known as:  WELLBUTRIN SR  Take 150 mg by mouth 2 (two) times daily.     CELEBREX 200 MG capsule  Generic drug:  celecoxib  Take 200 mg by mouth daily.     colesevelam 625 MG tablet  Commonly known as:  WELCHOL  Take 1,875 mg by mouth 2 (two) times daily with a meal. 3 tabs am 3 tabs pm     CYTOMEL 5 MCG tablet  Generic drug:  liothyronine  Take 5 mcg by mouth daily.     DILT-XR 240 MG 24 hr capsule  Generic drug:  diltiazem  TAKE ONE (1) CAPSULE EACH DAY     diltiazem 240 MG 24 hr capsule  Commonly known as:  CARDIZEM CD  TAKE ONE (1) CAPSULE EACH DAY     DULoxetine 60 MG capsule  Commonly known as:  CYMBALTA  Take 120 mg by mouth daily.     furosemide 80 MG tablet  Commonly known as:  LASIX  Take 80 mg by mouth daily as needed for fluid.     HYDROcodone-acetaminophen 5-325 MG per tablet  Commonly known as:  NORCO/VICODIN  Take 2 tablets by mouth every 4 (four) hours as needed.     HYDROcodone-acetaminophen 5-325 MG per tablet  Commonly known as:  NORCO/VICODIN  Take 1-2 tablets by mouth every 6 (six) hours as needed for moderate pain.     LANTUS SOLOSTAR 100 UNIT/ML injection  Generic drug:  insulin glargine  Inject 15 Units into the skin at bedtime.     levothyroxine 200 MCG tablet  Commonly known as:  SYNTHROID, LEVOTHROID  Take 200 mcg by mouth daily before breakfast.     levothyroxine 75 MCG tablet  Commonly known as:  SYNTHROID, LEVOTHROID  Take 75 mcg by mouth daily before breakfast.     linagliptin 5 MG Tabs tablet  Commonly known as:  TRADJENTA  Take 5 mg by mouth daily.     lisinopril 40 MG tablet   Commonly known as:  PRINIVIL,ZESTRIL  Take 40 mg by mouth daily.     metFORMIN 1000 MG tablet  Commonly known as:  GLUCOPHAGE  Take 1,000 mg by mouth 2 (two) times daily with a meal.     naproxen 500 MG tablet  Commonly known as:  NAPROSYN  Take 1 tablet (500 mg total) by mouth 2 (two) times daily with a meal.     nystatin 100000 UNIT/GM Powd  Apply 100,000 g topically daily. Rash     ondansetron 4 MG disintegrating tablet  Commonly known as:  ZOFRAN ODT  Take 1 tablet (4 mg total) by mouth every 8 (eight) hours as needed for nausea.     oxybutynin 5 MG 24 hr tablet  Commonly known as:  DITROPAN-XL  Take 5 mg by mouth at bedtime.     PROVENTIL HFA 108 (90 BASE) MCG/ACT inhaler  Generic drug:  albuterol  Inhale 2 puffs into the lungs every 6 (six) hours as needed. Shortness of breath     simvastatin 20 MG tablet  Commonly known as:  ZOCOR  Take 20 mg by mouth daily.     VOLTAREN 1 % Gel  Generic drug:  diclofenac sodium  Apply 4 g topically daily as needed. For pain        Patients skin is clean, dry and intact. Surgical incision was clean, dry, and intact. IV site discontinued and catheter remains intact. Site without signs and symptoms of complications. Dressing and pressure applied.  Patient escorted to car by [name] [name], NT in a wheelchair,  no distress noted upon discharge.  [name] [name] 06/10/2014 1:55 PM

## 2014-06-10 NOTE — Progress Notes (Signed)
UR chart review completed.  

## 2014-06-10 NOTE — Anesthesia Postprocedure Evaluation (Signed)
  Anesthesia Post-op Note  Patient: [name] [name]  Procedure(s) Performed: Procedure(s): RECURRENT  INCISIONAL HERNIORRHAPHY WITH MESH (N/A) INSERTION OF MESH (N/A)  Patient Location: room 314  Anesthesia Type:General  Level of Consciousness: awake, alert , oriented and patient cooperative  Airway and Oxygen Therapy: Patient Spontanous Breathing  Post-op Pain: 3 /10, mild  Post-op Assessment: Post-op Vital signs reviewed, Patient's Cardiovascular Status Stable, Respiratory Function Stable, Patent Airway, No signs of Nausea or vomiting, Adequate PO intake and Pain level controlled  Post-op Vital Signs: Reviewed and stable  Last Vitals:  Filed Vitals:   06/10/14 0614  BP: 135/63  Pulse: 106  Temp: 36.9 C  Resp: 16    Complications: No apparent anesthesia complications

## 2014-06-10 NOTE — Care Management Note (Signed)
    Page 1 of 1   06/10/2014     12:38:29 PM CARE MANAGEMENT NOTE 06/10/2014  Patient:  [name] [name], [name] [name]   Account Number:  [id number]  Date Initiated:  06/10/2014  Documentation initiated by:  Jolene Provost  Subjective/Objective Assessment:   Pt is from home. Pt admitted following hernia repair. Pt plans to discharge home with self care. No CM needs identified.     Action/Plan:   Anticipated DC Date:  06/10/2014   Anticipated DC Plan:  [facility]  CM consult      Choice offered to / List presented to:             Status of service:  Completed, signed off Medicare Important Message given?   (If response is "NO", the following Medicare IM given date fields will be blank) Date Medicare IM given:   Medicare IM given by:   Date Additional Medicare IM given:   Additional Medicare IM given by:    Discharge Disposition:  HOME/SELF CARE  Per UR Regulation:    If discussed at Long Length of Stay Meetings, dates discussed:    Comments:  06/10/2014 [place], RN, MSN, [facility]

## 2014-06-10 NOTE — Discharge Summary (Signed)
Physician Discharge Summary  Patient ID: [name] [name] MRN: [id number] DOB/AGE: 59-04-1956 59 y.o.  Admit date: 06/09/2014 Discharge date: 06/10/2014  Admission Diagnoses: Recurrent incisional hernia, morbid obesity, hypertension  Discharge Diagnoses: Same Active Problems:   Incisional hernia   Discharged Condition: good  Hospital Course: Patient is a 58 year old white female with multiple medical problems who presented with a recurrent incisional hernia. She underwent a recurrent incisional herniorrhaphy with mesh on 06/09/2014. She tolerated the surgery well. Her postoperative course was for the most part unremarkable. Her diet was advanced without difficulty. The patient is being discharged home on postoperative day one in good improving condition.  Treatments: surgery: Recurrent incisional herniorrhaphy with mesh on 06/09/2014  Discharge Exam: Blood pressure 135/63, pulse 106, temperature 98.4 F (36.9 C), temperature source Oral, resp. rate 16, height 5' 4"  (1.626 m), SpO2 94 %. General appearance: alert, cooperative and no distress Resp: clear to auscultation bilaterally Cardio: regular rate and rhythm, S1, S2 normal, no murmur, click, rub or gallop GI: Soft. Incision healing well.  Disposition: 01-Home or Self Care     Medication List    TAKE these medications        aspirin 81 MG EC tablet  Take 81 mg by mouth daily.     buPROPion 150 MG 12 hr tablet  Commonly known as:  WELLBUTRIN SR  Take 150 mg by mouth 2 (two) times daily.     CELEBREX 200 MG capsule  Generic drug:  celecoxib  Take 200 mg by mouth daily.     colesevelam 625 MG tablet  Commonly known as:  WELCHOL  Take 1,875 mg by mouth 2 (two) times daily with a meal. 3 tabs am 3 tabs pm     CYTOMEL 5 MCG tablet  Generic drug:  liothyronine  Take 5 mcg by mouth daily.     DILT-XR 240 MG 24 hr capsule  Generic drug:  diltiazem  TAKE ONE (1) CAPSULE EACH DAY     diltiazem 240 MG 24 hr capsule   Commonly known as:  CARDIZEM CD  TAKE ONE (1) CAPSULE EACH DAY     DULoxetine 60 MG capsule  Commonly known as:  CYMBALTA  Take 120 mg by mouth daily.     furosemide 80 MG tablet  Commonly known as:  LASIX  Take 80 mg by mouth daily as needed for fluid.     HYDROcodone-acetaminophen 5-325 MG per tablet  Commonly known as:  NORCO/VICODIN  Take 2 tablets by mouth every 4 (four) hours as needed.     HYDROcodone-acetaminophen 5-325 MG per tablet  Commonly known as:  NORCO/VICODIN  Take 1-2 tablets by mouth every 6 (six) hours as needed for moderate pain.     LANTUS SOLOSTAR 100 UNIT/ML injection  Generic drug:  insulin glargine  Inject 15 Units into the skin at bedtime.     levothyroxine 200 MCG tablet  Commonly known as:  SYNTHROID, LEVOTHROID  Take 200 mcg by mouth daily before breakfast.     levothyroxine 75 MCG tablet  Commonly known as:  SYNTHROID, LEVOTHROID  Take 75 mcg by mouth daily before breakfast.     linagliptin 5 MG Tabs tablet  Commonly known as:  TRADJENTA  Take 5 mg by mouth daily.     lisinopril 40 MG tablet  Commonly known as:  PRINIVIL,ZESTRIL  Take 40 mg by mouth daily.     metFORMIN 1000 MG tablet  Commonly known as:  GLUCOPHAGE  Take 1,000 mg by mouth  2 (two) times daily with a meal.     naproxen 500 MG tablet  Commonly known as:  NAPROSYN  Take 1 tablet (500 mg total) by mouth 2 (two) times daily with a meal.     nystatin 100000 UNIT/GM Powd  Apply 100,000 g topically daily. Rash     ondansetron 4 MG disintegrating tablet  Commonly known as:  ZOFRAN ODT  Take 1 tablet (4 mg total) by mouth every 8 (eight) hours as needed for nausea.     oxybutynin 5 MG 24 hr tablet  Commonly known as:  DITROPAN-XL  Take 5 mg by mouth at bedtime.     PROVENTIL HFA 108 (90 BASE) MCG/ACT inhaler  Generic drug:  albuterol  Inhale 2 puffs into the lungs every 6 (six) hours as needed. Shortness of breath     simvastatin 20 MG tablet  Commonly known as:   ZOCOR  Take 20 mg by mouth daily.     VOLTAREN 1 % Gel  Generic drug:  diclofenac sodium  Apply 4 g topically daily as needed. For pain           Follow-up Information    Follow up with [name], MD. Schedule an appointment as soon as possible for a visit on 06/24/2014.   Specialty:  General Surgery   Contact information:   [id number] [place] [address] [phone]       Signed: Aviva Signs A 06/10/2014, 7:59 AM

## 2014-07-03 ENCOUNTER — Encounter (HOSPITAL_COMMUNITY): Payer: Self-pay | Admitting: Orthopedic Surgery

## 2014-07-21 ENCOUNTER — Encounter (HOSPITAL_COMMUNITY): Payer: Self-pay | Admitting: *Deleted

## 2014-07-21 ENCOUNTER — Emergency Department (HOSPITAL_COMMUNITY)
Admission: EM | Admit: 2014-07-21 | Discharge: 2014-07-22 | Disposition: A | Discharge status: Home / Self Care | Payer: Medicare Other | Attending: Emergency Medicine | Admitting: Emergency Medicine

## 2014-07-21 DIAGNOSIS — Z8719 Personal history of other diseases of the digestive system: Secondary | ICD-10-CM | POA: Diagnosis not present

## 2014-07-21 DIAGNOSIS — J449 Chronic obstructive pulmonary disease, unspecified: Secondary | ICD-10-CM | POA: Insufficient documentation

## 2014-07-21 DIAGNOSIS — R143 Flatulence: Secondary | ICD-10-CM | POA: Diagnosis not present

## 2014-07-21 DIAGNOSIS — Z9981 Dependence on supplemental oxygen: Secondary | ICD-10-CM | POA: Diagnosis not present

## 2014-07-21 DIAGNOSIS — E119 Type 2 diabetes mellitus without complications: Secondary | ICD-10-CM | POA: Diagnosis not present

## 2014-07-21 DIAGNOSIS — Z9049 Acquired absence of other specified parts of digestive tract: Secondary | ICD-10-CM | POA: Insufficient documentation

## 2014-07-21 DIAGNOSIS — G4730 Sleep apnea, unspecified: Secondary | ICD-10-CM | POA: Insufficient documentation

## 2014-07-21 DIAGNOSIS — R110 Nausea: Secondary | ICD-10-CM | POA: Insufficient documentation

## 2014-07-21 DIAGNOSIS — Z90710 Acquired absence of both cervix and uterus: Secondary | ICD-10-CM | POA: Insufficient documentation

## 2014-07-21 DIAGNOSIS — F419 Anxiety disorder, unspecified: Secondary | ICD-10-CM | POA: Diagnosis not present

## 2014-07-21 DIAGNOSIS — Z791 Long term (current) use of non-steroidal anti-inflammatories (NSAID): Secondary | ICD-10-CM | POA: Insufficient documentation

## 2014-07-21 DIAGNOSIS — Z880 Allergy status to penicillin: Secondary | ICD-10-CM | POA: Diagnosis not present

## 2014-07-21 DIAGNOSIS — M1990 Unspecified osteoarthritis, unspecified site: Secondary | ICD-10-CM | POA: Diagnosis not present

## 2014-07-21 DIAGNOSIS — Z9889 Other specified postprocedural states: Secondary | ICD-10-CM | POA: Insufficient documentation

## 2014-07-21 DIAGNOSIS — Z85850 Personal history of malignant neoplasm of thyroid: Secondary | ICD-10-CM | POA: Insufficient documentation

## 2014-07-21 DIAGNOSIS — Z7982 Long term (current) use of aspirin: Secondary | ICD-10-CM | POA: Diagnosis not present

## 2014-07-21 DIAGNOSIS — R109 Unspecified abdominal pain: Secondary | ICD-10-CM

## 2014-07-21 DIAGNOSIS — I10 Essential (primary) hypertension: Secondary | ICD-10-CM | POA: Diagnosis not present

## 2014-07-21 DIAGNOSIS — E039 Hypothyroidism, unspecified: Secondary | ICD-10-CM | POA: Insufficient documentation

## 2014-07-21 DIAGNOSIS — Z79899 Other long term (current) drug therapy: Secondary | ICD-10-CM | POA: Insufficient documentation

## 2014-07-21 LAB — COMPREHENSIVE METABOLIC PANEL
ALK PHOS: 165 U/L — AB (ref 39–117)
ALT: 27 U/L (ref 0–35)
AST: 28 U/L (ref 0–37)
Albumin: 3.3 g/dL — ABNORMAL LOW (ref 3.5–5.2)
Anion gap: 4 — ABNORMAL LOW (ref 5–15)
BILIRUBIN TOTAL: 0.4 mg/dL (ref 0.3–1.2)
BUN: 13 mg/dL (ref 6–23)
CALCIUM: 8.8 mg/dL (ref 8.4–10.5)
CO2: 26 mmol/L (ref 19–32)
Chloride: 102 mmol/L (ref 96–112)
Creatinine, Ser: 0.83 mg/dL (ref 0.50–1.10)
GFR calc Af Amer: 88 mL/min — ABNORMAL LOW (ref >90)
GFR calc non Af Amer: 76 mL/min — ABNORMAL LOW (ref >90)
Glucose, Bld: 208 mg/dL — ABNORMAL HIGH (ref 70–99)
POTASSIUM: 3.7 mmol/L (ref 3.5–5.1)
Sodium: 132 mmol/L — ABNORMAL LOW (ref 135–145)
Total Protein: 7.1 g/dL (ref 6.0–8.3)

## 2014-07-21 LAB — CBC WITH DIFFERENTIAL/PLATELET
BASOS PCT: 1 % (ref 0–1)
Basophils Absolute: 0 10*3/uL (ref 0.0–0.1)
Eosinophils Absolute: 0.2 10*3/uL (ref 0.0–0.7)
Eosinophils Relative: 3 % (ref 0–5)
HCT: 34.7 % — ABNORMAL LOW (ref 36.0–46.0)
Hemoglobin: 11.1 g/dL — ABNORMAL LOW (ref 12.0–15.0)
LYMPHS PCT: 28 % (ref 12–46)
Lymphs Abs: 2.1 10*3/uL (ref 0.7–4.0)
MCH: 27.2 pg (ref 26.0–34.0)
MCHC: 32 g/dL (ref 30.0–36.0)
MCV: 85 fL (ref 78.0–100.0)
Monocytes Absolute: 0.4 10*3/uL (ref 0.1–1.0)
Monocytes Relative: 6 % (ref 3–12)
NEUTROS ABS: 4.7 10*3/uL (ref 1.7–7.7)
Neutrophils Relative %: 63 % (ref 43–77)
Platelets: 188 10*3/uL (ref 150–400)
RBC: 4.08 MIL/uL (ref 3.87–5.11)
RDW: 14.4 % (ref 11.5–15.5)
WBC: 7.5 10*3/uL (ref 4.0–10.5)

## 2014-07-21 LAB — URINALYSIS, ROUTINE W REFLEX MICROSCOPIC
Bilirubin Urine: NEGATIVE
GLUCOSE, UA: NEGATIVE mg/dL
HGB URINE DIPSTICK: NEGATIVE
Ketones, ur: NEGATIVE mg/dL
Nitrite: NEGATIVE
PH: 6 (ref 5.0–8.0)
Protein, ur: NEGATIVE mg/dL
Specific Gravity, Urine: 1.015 (ref 1.005–1.030)
Urobilinogen, UA: 0.2 mg/dL (ref 0.0–1.0)

## 2014-07-21 LAB — URINE MICROSCOPIC-ADD ON

## 2014-07-21 LAB — LIPASE, BLOOD: Lipase: 17 U/L (ref 11–59)

## 2014-07-21 NOTE — ED Provider Notes (Signed)
CSN: [id number]     Arrival date & time 07/21/14  2123 History  This chart was scribed for Sharyon Cable, MD by Chester Holstein, ED Scribe. This patient was seen in room APA14/APA14 and the patient's care was started at 10:27 PM.    Chief Complaint  Patient presents with  . Abdominal Pain  Patient gave verbal permission to utilize photo for medical documentation only The image was not stored on any personal device  Patient is a 59 y.o. female presenting with abdominal pain. The history is provided by the patient. No language interpreter was used.  Abdominal Pain Pain quality: shooting   Pain severity:  Severe Onset quality:  Sudden Timing:  Intermittent Progression:  Worsening Chronicity:  New Context: eating and previous surgery   Relieved by:  Bowel activity Worsened by:  Eating Associated symptoms: belching, cough and nausea   Associated symptoms: no chest pain, no diarrhea, no dysuria and no vomiting    HPI Comments: [name] [name] is a 59 y.o. female with PMHx of GERD, recurrent umbilical hernia, who presents to the Emergency Department complaining of worsening intermittent shooting abdominal pain with onset 3 days ago. Pt had umbilical hernia repair 17/51/0258 by Dr. Arnoldo Morale. Pt notes associated belching and gas, warmth, waxing and waning abdominal swelling, mild drainage (baseline after surgery), mild cough, and nausea. Pt PSH includes cholecystitis but she notes food intake aggravates pain and increases bowel sounds, likens it to a "gallbladder attack." Pt has completed her course of Abx.  Pt denies vomiting, urinary and bowel changes, diarrhea, and dysuria.   Past Medical History  Diagnosis Date  . Type 2 diabetes mellitus   . Essential hypertension, benign   . GERD (gastroesophageal reflux disease)   . Edema   . Morbid obesity   . COPD (chronic obstructive pulmonary disease)   . Asthma   . Arthritis   . Depression   . Cirrhosis of liver   . Hypothyroidism   .  Thyroid cancer   . Sleep apnea     CPAP- hasnt used in awhile "ot needs to be fixed".  . Anxiety    Past Surgical History  Procedure Laterality Date  . Thyroidectomy  2009  . Cholecystectomy  1996  . Tonsillectomy  1958  . Foot surgery Bilateral 2000    Bone spur removed  . Abdominal hysterectomy  1985  . Exploratory laparotomy  2003  . Umbilical hernia repair  2010  . Debridement of abdominal wound  2011  . Cesarean section  1977  . Resection distal clavical  05/07/2012    Procedure: RESECTION DISTAL CLAVICAL;  Surgeon: [name], MD;  Location: [facility];  Service: Orthopedics;  Laterality: Right;  . Shoulder open rotator cuff repair  05/07/2012    Procedure: ROTATOR CUFF REPAIR SHOULDER OPEN;  Surgeon: [name], MD;  Location: [facility];  Service: Orthopedics;  Laterality: Right;  . Incisional hernia repair N/A 06/09/2014    Procedure: RECURRENT  INCISIONAL HERNIORRHAPHY WITH MESH;  Surgeon: [name], MD;  Location: [facility];  Service: General;  Laterality: N/A;  . Insertion of mesh N/A 06/09/2014    Procedure: INSERTION OF MESH;  Surgeon: [name], MD;  Location: [facility];  Service: General;  Laterality: N/A;  . Cardiac catheterization  2012   Family History  Problem Relation Age of Onset  . Heart disease    . Arthritis    . Cancer    . Asthma    .  Diabetes    . Kidney disease     History  Substance Use Topics  . Smoking status: Never Smoker   . Smokeless tobacco: Never Used  . Alcohol Use: No   OB History    No data available     Review of Systems  Respiratory: Positive for cough.   Cardiovascular: Negative for chest pain.  Gastrointestinal: Positive for nausea and abdominal pain. Negative for vomiting and diarrhea.  Genitourinary: Negative for dysuria.  Skin:       Warmth to abdomen  All other systems reviewed and are negative.     Allergies  Adhesive; Percogesic; Clarithromycin; Clindamycin/lincomycin; Neosporin; and  Penicillins  Home Medications   Prior to Admission medications   Medication Sig Start Date End Date Taking? Authorizing Provider  albuterol (PROVENTIL HFA) 108 (90 BASE) MCG/ACT inhaler Inhale 2 puffs into the lungs every 6 (six) hours as needed. Shortness of breath    Historical Provider, MD  aspirin 81 MG EC tablet Take 81 mg by mouth daily.     Historical Provider, MD  buPROPion (WELLBUTRIN SR) 150 MG 12 hr tablet Take 150 mg by mouth 2 (two) times daily.  09/20/10   Historical Provider, MD  CELEBREX 200 MG capsule Take 200 mg by mouth daily.  09/20/10   Historical Provider, MD  colesevelam (WELCHOL) 625 MG tablet Take 1,875 mg by mouth 2 (two) times daily with a meal. 3 tabs am 3 tabs pm    Historical Provider, MD  DILT-XR 240 MG 24 hr capsule TAKE ONE (1) CAPSULE EACH DAY 09/18/13   Satira Sark, MD  diltiazem (CARDIZEM CD) 240 MG 24 hr capsule TAKE ONE (1) CAPSULE EACH DAY Patient not taking: Reported on 05/28/2014 06/19/13   Lendon Colonel, NP  DULoxetine (CYMBALTA) 60 MG capsule Take 120 mg by mouth daily.     Historical Provider, MD  furosemide (LASIX) 80 MG tablet Take 80 mg by mouth daily as needed for fluid.     Historical Provider, MD  HYDROcodone-acetaminophen (NORCO/VICODIN) 5-325 MG per tablet Take 2 tablets by mouth every 4 (four) hours as needed. 08/25/13   Johnna Acosta, MD  HYDROcodone-acetaminophen (NORCO/VICODIN) 5-325 MG per tablet Take 1-2 tablets by mouth every 6 (six) hours as needed for moderate pain. 06/10/14 06/10/15  [name], MD  insulin glargine (LANTUS SOLOSTAR) 100 UNIT/ML injection Inject 15 Units into the skin at bedtime.     Historical Provider, MD  levothyroxine (SYNTHROID, LEVOTHROID) 200 MCG tablet Take 200 mcg by mouth daily before breakfast.    Historical Provider, MD  levothyroxine (SYNTHROID, LEVOTHROID) 75 MCG tablet Take 75 mcg by mouth daily before breakfast.    Historical Provider, MD  linagliptin (TRADJENTA) 5 MG TABS tablet Take 5 mg by  mouth daily.    Historical Provider, MD  liothyronine (CYTOMEL) 5 MCG tablet Take 5 mcg by mouth daily.    Historical Provider, MD  lisinopril (PRINIVIL,ZESTRIL) 40 MG tablet Take 40 mg by mouth daily.    Historical Provider, MD  metFORMIN (GLUCOPHAGE) 1000 MG tablet Take 1,000 mg by mouth 2 (two) times daily with a meal.    Historical Provider, MD  naproxen (NAPROSYN) 500 MG tablet Take 1 tablet (500 mg total) by mouth 2 (two) times daily with a meal. Patient not taking: Reported on 05/28/2014 08/25/13   Johnna Acosta, MD  nystatin (NYSTOP) 100000 UNIT/GM POWD Apply 100,000 g topically daily. Rash 08/18/10   Historical Provider, MD  ondansetron (ZOFRAN ODT)  4 MG disintegrating tablet Take 1 tablet (4 mg total) by mouth every 8 (eight) hours as needed for nausea. Patient not taking: Reported on 05/28/2014 10/26/12   Janice Norrie, MD  oxybutynin (DITROPAN-XL) 5 MG 24 hr tablet Take 5 mg by mouth at bedtime.    Historical Provider, MD  simvastatin (ZOCOR) 20 MG tablet Take 20 mg by mouth daily.  09/02/10   Historical Provider, MD  VOLTAREN 1 % GEL Apply 4 g topically daily as needed. For pain 10/24/12   Historical Provider, MD   BP 193/70 mmHg  Pulse 98  Temp(Src) 98.2 F (36.8 C) (Oral)  Resp 20  Ht 5' 5"  (1.651 m)  Wt 307 lb 14.4 oz (139.663 kg)  BMI 51.24 kg/m2  SpO2 100% Physical Exam  Nursing note and vitals reviewed. CONSTITUTIONAL: Well developed/well nourished, no distress noted HEAD: Normocephalic/atraumatic EYES: EOMI ENMT: Mucous membranes moist NECK: supple no meningeal signs SPINE/BACK:entire spine nontender CV: S1/S2 noted, no murmurs/rubs/gallops noted LUNGS: Lungs are clear to auscultation bilaterally, no apparent distress ABDOMEN: soft, nontender, no rebound or guarding, bowel sounds noted throughout abdomen, obese, see photo below GU:no cva tenderness NEURO: Pt is awake/alert/appropriate, moves all extremitiesx4.  No facial droop.   EXTREMITIES: pulses normal/equal, full  ROM SKIN: warm, color normal PSYCH: no abnormalities of mood noted, alert and oriented to situation     ED Course  Procedures DIAGNOSTIC STUDIES: Oxygen Saturation is 100% on room air, normal by my interpretation.    COORDINATION OF CARE: 10:35 PM Discussed treatment plan with patient at beside, the patient agrees with the plan and has no further questions at this time.  10:42 PM D/w dr Arnoldo Morale He knows patient well He reports that wound sounds at baseline He does not feel this represents acute post operative complication and he does not recommend CT imaging  Pt reports her upper abdominal pain feels similar to gallbladder pain (she is s/p cholecystectomy) Will check labs.  If elevated WBC dr Arnoldo Morale recommends starting oral doxycycline as she does have chronic drainage from wound.   11:32 PM Initial labs negative She is watching Tv, no distress She has no focal tenderness Suspect she is at baseline and don't feel she needs imaging Urinalysis pending - signed out to dr zammit to f/u on U/A Pt has appt with dr Arnoldo Morale later this week Labs Review Labs Reviewed  COMPREHENSIVE METABOLIC PANEL - Abnormal; Notable for the following:    Sodium 132 (*)    Glucose, Bld 208 (*)    Albumin 3.3 (*)    Alkaline Phosphatase 165 (*)    GFR calc non Af Amer 76 (*)    GFR calc Af Amer 88 (*)    Anion gap 4 (*)    All other components within normal limits  CBC WITH DIFFERENTIAL/PLATELET - Abnormal; Notable for the following:    Hemoglobin 11.1 (*)    HCT 34.7 (*)    All other components within normal limits  LIPASE, BLOOD  URINALYSIS, ROUTINE W REFLEX MICROSCOPIC     EKG Interpretation   Date/Time:  Monday July 21 2014 22:56:11 EST Ventricular Rate:  97 PR Interval:  189 QRS Duration: 84 QT Interval:  346 QTC Calculation: 439 R Axis:   36 Text Interpretation:  Sinus rhythm Baseline wander in lead(s) V1 artifact  noted Otherwise no significant change Confirmed by Christy Gentles   MD, Deyana Wnuk  [phone]) on 07/21/2014 10:58:58 PM      MDM   Final diagnoses:  Abdominal pain,  unspecified abdominal location    Nursing notes including past medical history and social history reviewed and considered in documentation Labs/vital reviewed myself and considered during evaluation Previous records reviewed and considered   I personally performed the services described in this documentation, which was scribed in my presence. The recorded information has been reviewed and is accurate.      Sharyon Cable, MD 07/21/14 [phone]

## 2014-07-21 NOTE — Discharge Instructions (Signed)
°  SEEK IMMEDIATE MEDICAL ATTENTION IF: The pain does not go away or becomes severe, particularly over the next 8-12 hours.  A temperature above 100.72F develops.  Repeated vomiting occurs (multiple episodes).   Blood is being passed in stools or vomit (bright red or black tarry stools).  Return also if you develop chest pain, difficulty breathing, dizziness or fainting, or become confused, poorly responsive, or inconsolable.

## 2014-07-21 NOTE — ED Notes (Addendum)
Had abd hernia repair 12/21  Dr Arnoldo Morale. Now feels abd swollen  .  Nausea,   abd pain.

## 2014-08-11 ENCOUNTER — Other Ambulatory Visit (HOSPITAL_COMMUNITY): Payer: Self-pay | Admitting: Internal Medicine

## 2014-08-11 DIAGNOSIS — R109 Unspecified abdominal pain: Secondary | ICD-10-CM

## 2014-08-15 ENCOUNTER — Encounter (HOSPITAL_COMMUNITY)
Admission: RE | Admit: 2014-08-15 | Discharge: 2014-08-15 | Disposition: A | Discharge status: Home / Self Care | Payer: Medicare Other | Source: Ambulatory Visit | Attending: Internal Medicine | Admitting: Internal Medicine

## 2014-08-15 ENCOUNTER — Encounter (HOSPITAL_COMMUNITY): Payer: Self-pay

## 2014-08-15 DIAGNOSIS — R109 Unspecified abdominal pain: Secondary | ICD-10-CM | POA: Diagnosis present

## 2014-08-15 DIAGNOSIS — R6881 Early satiety: Secondary | ICD-10-CM | POA: Insufficient documentation

## 2014-08-15 DIAGNOSIS — K219 Gastro-esophageal reflux disease without esophagitis: Secondary | ICD-10-CM | POA: Diagnosis not present

## 2014-08-15 DIAGNOSIS — R110 Nausea: Secondary | ICD-10-CM | POA: Diagnosis not present

## 2014-08-15 MED ORDER — TECHNETIUM TC 99M SULFUR COLLOID
2.0000 | Freq: Once | INTRAVENOUS | Status: AC | PRN
  Administered 2014-08-15: 2 via ORAL

## 2014-08-18 ENCOUNTER — Ambulatory Visit (HOSPITAL_COMMUNITY)
Admission: RE | Admit: 2014-08-18 | Discharge: 2014-08-18 | Disposition: A | Discharge status: Home / Self Care | Payer: Medicare Other | Source: Ambulatory Visit | Attending: Internal Medicine | Admitting: Internal Medicine

## 2014-08-18 DIAGNOSIS — R932 Abnormal findings on diagnostic imaging of liver and biliary tract: Secondary | ICD-10-CM | POA: Diagnosis not present

## 2014-08-18 DIAGNOSIS — Z9049 Acquired absence of other specified parts of digestive tract: Secondary | ICD-10-CM | POA: Insufficient documentation

## 2014-08-18 DIAGNOSIS — Z90710 Acquired absence of both cervix and uterus: Secondary | ICD-10-CM | POA: Diagnosis not present

## 2014-08-18 DIAGNOSIS — K439 Ventral hernia without obstruction or gangrene: Secondary | ICD-10-CM | POA: Diagnosis not present

## 2014-08-18 DIAGNOSIS — I728 Aneurysm of other specified arteries: Secondary | ICD-10-CM | POA: Insufficient documentation

## 2014-08-18 DIAGNOSIS — R109 Unspecified abdominal pain: Secondary | ICD-10-CM | POA: Diagnosis present

## 2014-08-18 MED ORDER — IOHEXOL 350 MG/ML SOLN
100.0000 mL | Freq: Once | INTRAVENOUS | Status: AC | PRN
  Administered 2014-08-18: 100 mL via INTRAVENOUS

## 2014-09-01 ENCOUNTER — Ambulatory Visit (INDEPENDENT_AMBULATORY_CARE_PROVIDER_SITE_OTHER): Payer: Medicare Other | Admitting: Internal Medicine

## 2014-09-01 ENCOUNTER — Encounter (INDEPENDENT_AMBULATORY_CARE_PROVIDER_SITE_OTHER): Payer: Self-pay | Admitting: Internal Medicine

## 2014-09-01 VITALS — BP 144/70 | HR 82 | Temp 98.0°F | Resp 18 | Ht 65.0 in | Wt 312.9 lb

## 2014-09-01 DIAGNOSIS — R109 Unspecified abdominal pain: Secondary | ICD-10-CM

## 2014-09-01 DIAGNOSIS — K7460 Unspecified cirrhosis of liver: Secondary | ICD-10-CM | POA: Insufficient documentation

## 2014-09-01 DIAGNOSIS — R1310 Dysphagia, unspecified: Secondary | ICD-10-CM

## 2014-09-01 DIAGNOSIS — R1314 Dysphagia, pharyngoesophageal phase: Secondary | ICD-10-CM

## 2014-09-01 DIAGNOSIS — R1319 Other dysphagia: Secondary | ICD-10-CM

## 2014-09-01 MED ORDER — PANTOPRAZOLE SODIUM 40 MG PO TBEC: 40.0000 mg | DELAYED_RELEASE_TABLET | Freq: Every day | ORAL | Status: DC

## 2014-09-01 NOTE — Patient Instructions (Addendum)
Esophagogastroduodenoscopy with esophageal dilation and colonoscopy to be scheduled. Take Celebrex on as-needed basis rather than every day. Physician will call with results of blood tests when completed.

## 2014-09-01 NOTE — Progress Notes (Signed)
Presenting complaint;  Dysphagia, abdominal pain and history of cirrhosis.  History of present illness;  Patient is 59 year old Caucasian female who has cirrhosis secondary to NAFLD who is referred through courtesy of Dr. Riley Kill for GI evaluation. She was last seen by me in [place] in September 2008. She states she did call our office a few years ago to make an appointment but then she got busy taking care of her mother who is now in nursing home. She complains of dysphagia both to solids as for liquids. She points to suprasternal and lower sternal areas of subtle bolus obstruction. She also complains of intermittent abdominal pain which she has had since ventral hernia has recurred. Pain usually occurs after meals and is also experienced an upper and lower abdomen. She has heartburn no more than once or twice a month. Her esophagus was dilated back in 2004 providing relief from dysphagia for several years. She also had EGD in March 2008 and was negative for varices. He complains of intermittent nausea but denies vomiting. She also denies melena or rectal bleeding diarrhea and/or constipation. She says she has lost 20 pounds last year. Since ventral hernia has recurred she's not been able to eat much because of pain. She has an appointment to see Dr. Ninfa Linden regarding recurrent ventral hernia. She had incisional herniorrhaphy with mesh by Dr. Arnoldo Morale on 06/09/2014. She states she heard something pop while she was getting incentive spirometry. Hernia has gotten bigger since she left the hospital. This was her second surgery.    Current Medications: Outpatient Encounter Prescriptions as of 09/01/2014  Medication Sig  . albuterol (PROVENTIL HFA) 108 (90 BASE) MCG/ACT inhaler Inhale 2 puffs into the lungs every 6 (six) hours as needed. Shortness of breath  . amLODipine-valsartan (EXFORGE) 10-320 MG per tablet Take 1 tablet by mouth daily.   [name] aspirin 81 MG EC tablet Take 81 mg by mouth daily.   [name]  buPROPion (WELLBUTRIN XL) 300 MG 24 hr tablet Take 300 mg by mouth daily.   . CELEBREX 200 MG capsule Take 200 mg by mouth daily.   . colesevelam (WELCHOL) 625 MG tablet Take 1,875 mg by mouth 2 (two) times daily with a meal. 3 tabs am 3 tabs pm  . DILT-XR 240 MG 24 hr capsule TAKE ONE (1) CAPSULE EACH DAY  . DULoxetine (CYMBALTA) 60 MG capsule Take 120 mg by mouth daily.   . fluocinonide-emollient (LIDEX-E) 0.05 % cream Apply 1 application topically 3 (three) times daily.   . furosemide (LASIX) 80 MG tablet Take 20 mg by mouth daily.   [name] HYDROmorphone (DILAUDID) 4 MG tablet Take 4 mg by mouth every 6 (six) hours as needed for severe pain.   [name] insulin glargine (LANTUS SOLOSTAR) 100 UNIT/ML injection Inject 15 Units into the skin at bedtime.   [name] levothyroxine (SYNTHROID, LEVOTHROID) 200 MCG tablet Take 200 mcg by mouth daily before breakfast.  . levothyroxine (SYNTHROID, LEVOTHROID) 75 MCG tablet Take 75 mcg by mouth daily before breakfast.  . liothyronine (CYTOMEL) 5 MCG tablet Take 5 mcg by mouth daily.  [name] lisinopril (PRINIVIL,ZESTRIL) 40 MG tablet Take 40 mg by mouth daily.  . metFORMIN (GLUCOPHAGE) 1000 MG tablet Take 1,000 mg by mouth 2 (two) times daily with a meal.  . nystatin (NYSTOP) 100000 UNIT/GM POWD Apply 100,000 g topically daily. Rash  . oxybutynin (DITROPAN-XL) 5 MG 24 hr tablet Take 5 mg by mouth at bedtime.  . VOLTAREN 1 % GEL Apply 4 g topically daily as  needed. For pain  . [DISCONTINUED] buPROPion (WELLBUTRIN SR) 150 MG 12 hr tablet Take 150 mg by mouth 2 (two) times daily.   . [DISCONTINUED] diltiazem (CARDIZEM CD) 240 MG 24 hr capsule TAKE ONE (1) CAPSULE EACH DAY (Patient not taking: Reported on 05/28/2014)  . [DISCONTINUED] HYDROcodone-acetaminophen (NORCO/VICODIN) 5-325 MG per tablet Take 2 tablets by mouth every 4 (four) hours as needed. (Patient not taking: Reported on 09/01/2014)  . [DISCONTINUED] HYDROcodone-acetaminophen (NORCO/VICODIN) 5-325 MG per tablet Take 1-2  tablets by mouth every 6 (six) hours as needed for moderate pain. (Patient not taking: Reported on 09/01/2014)  . [DISCONTINUED] linagliptin (TRADJENTA) 5 MG TABS tablet Take 5 mg by mouth daily.  . [DISCONTINUED] naproxen (NAPROSYN) 500 MG tablet Take 1 tablet (500 mg total) by mouth 2 (two) times daily with a meal. (Patient not taking: Reported on 05/28/2014)  . [DISCONTINUED] ondansetron (ZOFRAN ODT) 4 MG disintegrating tablet Take 1 tablet (4 mg total) by mouth every 8 (eight) hours as needed for nausea. (Patient not taking: Reported on 05/28/2014)  . [DISCONTINUED] simvastatin (ZOCOR) 20 MG tablet Take 20 mg by mouth daily.    Past medical history;  Morbid obesity. Cirrhosis secondary to NAFLD. He had liver biopsy in December 2007 revealing advanced fibrosis consistent cirrhosis. She has received hepatitis A and B vaccination. History of thyroid carcinoma. Status post surgery followed by radioiodine twice 10 years ago. Hypertension of 20 years duration. Chronic low back pain. Osteoarthrosis both knees. Diabetes mellitus for 5-6 years. C-section 1. Laparotomy in 2000 for acute abdomen secondary to torsion of right ovarian torsion. Umbilical herniorrhaphy in September 2010 by Dr. Geroge Baseman. She had incisional herniorrhaphy with mesh in December 2015 and hernia recurred within days. Last colonoscopy was in 2004 with removal of small hyperplastic polyps. Bilateral foot surgery about 15 years ago to remove calcium deposits. Surgery for right rotator cuff tear about 2 years ago. Cholecystectomy about 15 years ago. Hysterectomy.  Allergies; Allergies  Allergen Reactions  . Adhesive [Tape] Other (See Comments)    Blisters skin  . Percogesic [Diphenhydramine-Acetaminophen] Itching    Makes head itch  . Clarithromycin Rash    Yeast Infection  . Clindamycin/Lincomycin Rash    Yeast Infection   . Neosporin [Neomycin-Polymyxin B Gu] Rash    Eyes Drops only  . Penicillins Rash   Family  history;  Father drank too much much alcohol and died at 95. Mother is 29 and a nursing home. Patient does not have any siblings.  Social history;  She is widowed; She had one child who died of MI at age 61; She has never smoked cigarettes or drank alcohol. She is not disabled but she worked at a convenient store for 30 years.   Objective: Blood pressure 144/70, pulse 82, temperature 98 F (36.7 C), temperature source Oral, resp. rate 18, height 5' 5"  (1.651 m), weight 312 lb 14.4 oz (141.931 kg). Patient is alert and in no acute distress. She does not have asterixis. Conjunctiva is pink. Sclera is nonicteric Oropharyngeal mucosa is normal. No neck masses or thyromegaly noted. Cardiac exam with regular rhythm normal S1 and S2. No murmur or gallop noted. Lungs are clear to auscultation. Abdomen is obese with large hernia and mid abdomen; it is completely reducible. She has multiple skin abrasions from scratching. She has mild tenderness in epigastrium and right midabdomen. Spleen is not palpable. Liver edge is 6-7 cm below right costal margin. No LE edema or clubbing noted. She has focal macular rash over her  left shin.  Labs/studies Results: CT angiogram abdomen and pelvis from 08/18/2014 reviewed. Lab data from 07/21/2014 as follows; WBC 7.5, H&H 11.1 and 34.7 and platelet count 188K Serum sodium 132, potassium 3.7, chloride 102, CO2 26, BUN 13, creatinine 0.83. Glucose 208 Bilirubin 0.4, AP 165, AST 28, ALT 27, albumin 3.3 and serum calcium 8.8.   Assessment:  #1. Cirrhosis secondary to NAFLD. He does not have stigmata of portal hypertension based on CT of 08/18/2014. She needs to be reassessed for esophageal and/or gastric varices. #2. Postprandial abdominal pain. She is on low-dose aspirin and takes Celebrex. She is therefore at risk for peptic ulcer disease. #3. Dysphagia. He possibly has esophageal motility disorder stricture or ring needs to be ruled out. #4. Large  recurrent incisional ventral hernia. She has an appointment with Dr. Ninfa Linden in near future. #5. Morbid obesity. She needs to be reevaluated for bariatric surgery and will talk with Dr. Ninfa Linden at the time of her visit.   Recommendations; Pantoprazole 40 mg by mouth every morning. Patient advised to take Celebrex on an as-needed basis. EGD, possible ED and colonoscopy to be performed with propofol. Serum INR and AFP will be checked.

## 2014-09-02 LAB — PROTIME-INR
INR: 0.99 (ref <1.50)
Prothrombin Time: 13.1 seconds (ref 11.6–15.2)

## 2014-09-02 LAB — AFP TUMOR MARKER: AFP-Tumor Marker: 4.1 ng/mL (ref <6.1)

## 2014-09-03 ENCOUNTER — Encounter (INDEPENDENT_AMBULATORY_CARE_PROVIDER_SITE_OTHER): Payer: Self-pay | Admitting: *Deleted

## 2014-09-03 ENCOUNTER — Telehealth (INDEPENDENT_AMBULATORY_CARE_PROVIDER_SITE_OTHER): Payer: Self-pay | Admitting: *Deleted

## 2014-09-03 DIAGNOSIS — K7460 Unspecified cirrhosis of liver: Secondary | ICD-10-CM

## 2014-09-03 NOTE — Telephone Encounter (Signed)
Per Dr.Rehman the patient will need to have labs drawn in 6 months.

## 2014-09-08 ENCOUNTER — Other Ambulatory Visit: Payer: Self-pay | Admitting: Surgery

## 2014-09-10 ENCOUNTER — Telehealth (INDEPENDENT_AMBULATORY_CARE_PROVIDER_SITE_OTHER): Payer: Self-pay | Admitting: *Deleted

## 2014-09-10 NOTE — Telephone Encounter (Signed)
[name] [name] called, she saw surgeon about hernia, he said he would need to do it laparoscopically, if that doesn't work then he would do surgically which she would have a big incision and drain tubes -- if that didn't work he would send her to [place] -- she is very high risk -- she will call us once she is done with this to schedule TCS/EGD/ED

## 2014-09-10 NOTE — Telephone Encounter (Signed)
This is not what my recommendation was. No delay evaluation until after surgery.

## 2014-09-18 ENCOUNTER — Other Ambulatory Visit: Payer: Self-pay | Admitting: Cardiology

## 2014-10-06 NOTE — Pre-Procedure Instructions (Signed)
[name] [name]  10/06/2014   Your procedure is scheduled on:  Wed, April 27 @ 8:30 AM  Report to [facility] Entrance A and go to Admitting at 6:30 AM.  Call this number if you have problems the morning of surgery: [phone]   Remember:   Do not eat food or drink liquids after midnight.   Take these medicines the morning of surgery with A SIP OF WATER: Albuterol<Bring Your Inhaler With You>,Amlodipine-Valsartan(Exforge),Wellbutrin(Bupropion),Cartia, Cymbalta(Duloxetine),Pain Pill(if needed),Synthroid(Levothyroxine),and Pantoprazole(Protonix)              Stop taking your Aspirin and Aleve. No Goody's,BC's,Ibuprofen,Fish Oil,or any Herbal Medications.    Do not wear jewelry, make-up or nail polish.  Do not wear lotions, powders, or perfumes. You may wear deodorant.  Do not shave 48 hours prior to surgery.   Do not bring valuables to the hospital.  [facility] is not responsible                  for any belongings or valuables.               Contacts, dentures or bridgework may not be worn into surgery.  Leave suitcase in the car. After surgery it may be brought to your room.  For patients admitted to the hospital, discharge time is determined by your                treatment team.               Patients discharged the day of surgery will not be allowed to drive  home.    Special Instructions:  [place] - Preparing for Surgery  Before surgery, you can play an important role.  Because skin is not sterile, your skin needs to be as free of germs as possible.  You can reduce the number of germs on you skin by washing with CHG (chlorahexidine gluconate) soap before surgery.  CHG is an antiseptic cleaner which kills germs and bonds with the skin to continue killing germs even after washing.  Please DO NOT use if you have an allergy to CHG or antibacterial soaps.  If your skin becomes reddened/irritated stop using the CHG and inform your nurse when you arrive at Short Stay.  Do not shave  (including legs and underarms) for at least 48 hours prior to the first CHG shower.  You may shave your face.  Please follow these instructions carefully:   1.  Shower with CHG Soap the night before surgery and the                                morning of Surgery.  2.  If you choose to wash your hair, wash your hair first as usual with your       normal shampoo.  3.  After you shampoo, rinse your hair and body thoroughly to remove the                      Shampoo.  4.  Use CHG as you would any other liquid soap.  You can apply chg directly       to the skin and wash gently with scrungie or a clean washcloth.  5.  Apply the CHG Soap to your body ONLY FROM THE NECK DOWN.        Do not use on open wounds or open sores.  Avoid  contact with your eyes,       ears, mouth and genitals (private parts).  Wash genitals (private parts)       with your normal soap.  6.  Wash thoroughly, paying special attention to the area where your surgery        will be performed.  7.  Thoroughly rinse your body with warm water from the neck down.  8.  DO NOT shower/wash with your normal soap after using and rinsing off       the CHG Soap.  9.  Pat yourself dry with a clean towel.            10.  Wear clean pajamas.            11.  Place clean sheets on your bed the night of your first shower and do not        sleep with pets.  Day of Surgery  Do not apply any lotions/deoderants the morning of surgery.  Please wear clean clothes to the hospital/surgery center.     Please read over the following fact sheets that you were given: Pain Booklet, Coughing and Deep Breathing and Surgical Site Infection Prevention

## 2014-10-07 ENCOUNTER — Encounter (HOSPITAL_COMMUNITY)
Admission: RE | Admit: 2014-10-07 | Discharge: 2014-10-07 | Disposition: A | Discharge status: Home / Self Care | Payer: Medicare Other | Source: Ambulatory Visit | Attending: Surgery | Admitting: Surgery

## 2014-10-07 ENCOUNTER — Encounter (HOSPITAL_COMMUNITY): Payer: Self-pay

## 2014-10-07 DIAGNOSIS — Z01818 Encounter for other preprocedural examination: Secondary | ICD-10-CM | POA: Insufficient documentation

## 2014-10-07 DIAGNOSIS — Z6841 Body Mass Index (BMI) 40.0 and over, adult: Secondary | ICD-10-CM | POA: Diagnosis not present

## 2014-10-07 DIAGNOSIS — K432 Incisional hernia without obstruction or gangrene: Secondary | ICD-10-CM | POA: Insufficient documentation

## 2014-10-07 DIAGNOSIS — K760 Fatty (change of) liver, not elsewhere classified: Secondary | ICD-10-CM | POA: Diagnosis not present

## 2014-10-07 DIAGNOSIS — G4733 Obstructive sleep apnea (adult) (pediatric): Secondary | ICD-10-CM | POA: Diagnosis not present

## 2014-10-07 DIAGNOSIS — J449 Chronic obstructive pulmonary disease, unspecified: Secondary | ICD-10-CM | POA: Diagnosis not present

## 2014-10-07 DIAGNOSIS — Z85850 Personal history of malignant neoplasm of thyroid: Secondary | ICD-10-CM | POA: Diagnosis not present

## 2014-10-07 DIAGNOSIS — K219 Gastro-esophageal reflux disease without esophagitis: Secondary | ICD-10-CM | POA: Insufficient documentation

## 2014-10-07 DIAGNOSIS — I10 Essential (primary) hypertension: Secondary | ICD-10-CM | POA: Insufficient documentation

## 2014-10-07 DIAGNOSIS — E118 Type 2 diabetes mellitus with unspecified complications: Secondary | ICD-10-CM | POA: Diagnosis not present

## 2014-10-07 DIAGNOSIS — Z01812 Encounter for preprocedural laboratory examination: Secondary | ICD-10-CM | POA: Insufficient documentation

## 2014-10-07 DIAGNOSIS — J45909 Unspecified asthma, uncomplicated: Secondary | ICD-10-CM | POA: Diagnosis not present

## 2014-10-07 DIAGNOSIS — E890 Postprocedural hypothyroidism: Secondary | ICD-10-CM | POA: Insufficient documentation

## 2014-10-07 LAB — BASIC METABOLIC PANEL
Anion gap: 9 (ref 5–15)
BUN: 11 mg/dL (ref 6–23)
CALCIUM: 9.1 mg/dL (ref 8.4–10.5)
CO2: 24 mmol/L (ref 19–32)
Chloride: 100 mmol/L (ref 96–112)
Creatinine, Ser: 0.73 mg/dL (ref 0.50–1.10)
Glucose, Bld: 293 mg/dL — ABNORMAL HIGH (ref 70–99)
Potassium: 4.2 mmol/L (ref 3.5–5.1)
Sodium: 133 mmol/L — ABNORMAL LOW (ref 135–145)

## 2014-10-07 LAB — CBC
HCT: 35.1 % — ABNORMAL LOW (ref 36.0–46.0)
Hemoglobin: 11.4 g/dL — ABNORMAL LOW (ref 12.0–15.0)
MCH: 27.5 pg (ref 26.0–34.0)
MCHC: 32.5 g/dL (ref 30.0–36.0)
MCV: 84.8 fL (ref 78.0–100.0)
Platelets: 166 10*3/uL (ref 150–400)
RBC: 4.14 MIL/uL (ref 3.87–5.11)
RDW: 14.3 % (ref 11.5–15.5)
WBC: 6.1 10*3/uL (ref 4.0–10.5)

## 2014-10-07 NOTE — Progress Notes (Addendum)
Has had cath  and echo done. (2012). (in [place]).  She stated that Dr. Lia Foyer told her things were normal.  She sees Dr. Domenic Polite in [place], and her [place] was 11/2013.  She denies any cardiac issues.  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  Areas on legs and ankle are covered.  DA

## 2014-10-08 ENCOUNTER — Encounter (HOSPITAL_COMMUNITY): Payer: Self-pay

## 2014-10-08 NOTE — Progress Notes (Signed)
Anesthesia Chart Review:  Patient is a 59 year old female scheduled for laparoscopic, possible open incisional hernia repair on 10/15/14 by Dr. [name]. She initially underwent incisional herniorrhaphy by Dr. Arnoldo Morale at [facility] on 06/09/14, but has since felt a pop and hernia has gotten bigger.  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 PCP is Dr. Gerarda Fraction. GI is Dr. Laural Golden. Cardiologist is Dr. Domenic Polite.  Meds include albuterol, ASA, Wellbutrin, Cartia XT, doxycycline, Cymbalta, Lasix, Dilaudid, Lantus, levothyroxine, Cytomel, metformin, Protonix.  07/21/14 EKG SR, baseline wanderer in V1. She denied any CP or new CV symptoms.  10/23/10 Cardiac cath (done for chest pain with probable normal but cannot r/o anterior ischemia vs breast attenuation on 10/11/10 stress test): CONCLUSIONS: 1. Well preserved overall left ventricular systolic function. 2. No evidence of high-grade focal coronary obstruction. 3. Minimal calcification of the LAD. 4. There was no gradient or pullback across aortic valve.  07/23/07 Echocardiogram: 1. Technically suboptimal but adequate echocardiographic study. 2. Mild left atrial enlargement; normal right atrial size. 3. Normal right ventricular size; mild RVH; hyperdynamic RV systolic function. 4. Normal aortic, mitral and tricuspid valves. 5. Normal proximal ascending aorta. 6. Normal main pulmonary artery; pulmonic valve not adequately imaged but is grossly normal. 7. There is a slight increase in velocity across the aortic valve, more likely representing increased flow rather than a transvalvular gradient. 8. Normal left ventricular size; mild to moderate hypertrophy; vigorous regional and global function. 9. Normal IVC.  Preoperative labs  noted. Glucose 293. LFTs were not done, but AST/ALT, total bilirubin were WNL on 07/21/14. ALK PH was elevated at 165. PT/INR was WNL on 09/01/14. A1C was 6.5 on 06/09/14.  I called and spoke with patient about her DM control.  Her glucometer is broken, and she typically gets a new one from Dr. Gerarda Fraction.  She is scheduled to see him on Friday 10/10/14.  She says she will get a POC A1C at that appointment.  Of note, she had a sweet tea and biscuit prior to her morning PAT appointment.)  She has been on doxycycline for "ulcerative dermitis" due to what sound like venous insufficiency.  She developed a rash believed due to doxycycline, and it is now stopped. I advised her to talk with Dr. Gerarda Fraction about her surgery plans and DM and control and ulcerative dermitis.  She is aware that a significantly elevated fasting glucose could postpone her surgery.  I also told her that if Dr. Gerarda Fraction felt she had active infection, Dr. Ninfa Linden would need to be notified because it could affect timing of her procedure.  [name] Hugh [facility] Short Stay Center/Anesthesiology Phone [phone] 10/08/2014 5:34 PM

## 2014-10-10 NOTE — Progress Notes (Signed)
Anesthesia Chart Review:  Patient is a 59 year old female scheduled for laparoscopic, possible open incisional hernia repair on 10/15/14 by Dr. [name]. She initially underwent incisional herniorrhaphy by Dr. Arnoldo Morale at [facility] on 06/09/14, but has since felt a pop and hernia has gotten bigger.  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 PCP is Dr. Gerarda Fraction. GI is Dr. Laural Golden. Cardiologist is Dr. Domenic Polite.  Meds include albuterol, ASA, Wellbutrin, Cartia XT, doxycycline, Cymbalta, Lasix, Dilaudid, Lantus, levothyroxine, Cytomel, metformin, Protonix.  07/21/14 EKG SR, baseline wanderer in V1. She denied any CP or new CV symptoms.  10/23/10 Cardiac cath (done for chest pain with probable normal but cannot r/o anterior ischemia vs breast attenuation on 10/11/10 stress test): CONCLUSIONS: 1. Well preserved overall left ventricular systolic function. 2. No evidence of high-grade focal coronary obstruction. 3. Minimal calcification of the LAD. 4. There was no gradient or pullback across aortic valve.  07/23/07 Echocardiogram: 1. Technically suboptimal but adequate echocardiographic study. 2. Mild left atrial enlargement; normal right atrial size. 3. Normal right ventricular size; mild RVH; hyperdynamic RV systolic function. 4. Normal aortic, mitral and tricuspid valves. 5. Normal proximal ascending aorta. 6. Normal main pulmonary artery; pulmonic valve not adequately imaged but is grossly normal. 7. There is a slight increase in velocity across the aortic valve, more likely representing increased flow rather than a transvalvular gradient. 8. Normal left ventricular size; mild to moderate hypertrophy; vigorous regional and global function. 9. Normal IVC.  Preoperative labs  noted. Glucose 293. LFTs were not done, but AST/ALT, total bilirubin were WNL on 07/21/14. ALK PH was elevated at 165. PT/INR was WNL on 09/01/14. A1C was 6.5 on 06/09/14.  I called and spoke with patient about her DM control.  Her glucometer is broken, and she typically gets a new one from Dr. Gerarda Fraction.  She is scheduled to see him on Friday 10/10/14.  She says she will get a POC A1C at that appointment.  Of note, she had a sweet tea and biscuit prior to her morning PAT appointment.)  She has been on doxycycline for "ulcerative dermitis" due to what sound like venous insufficiency.  She developed a rash believed due to doxycycline, and it is now stopped. I advised her to talk with Dr. Gerarda Fraction about her surgery plans and DM and control and ulcerative dermitis.  She is aware that a significantly elevated fasting glucose could postpone her surgery.  I also told her that if Dr. Gerarda Fraction felt she had active infection, Dr. Ninfa Linden would need to be notified because it could affect timing of her procedure.  [name] Hugh [facility] Short Stay Center/Anesthesiology Phone [phone] 10/10/2014 1:29 PM  Addendum: Patient called back this afternoon to report her A1C at Dr. Nolon Rod office this morning was 7.1.  Her BP was 143/70.  She states that he plans to see her following recovery from surgery.  [name] Hugh [facility] Short Stay Center/Anesthesiology Phone [phone] 10/10/2014 1:33 PM

## 2014-10-14 MED ORDER — VANCOMYCIN HCL 10 G IV SOLR
1500.0000 mg | INTRAVENOUS | Status: AC
  Administered 2014-10-15: 1500 mg via INTRAVENOUS
  Filled 2014-10-14: qty 1500

## 2014-10-14 NOTE — H&P (Signed)
[name] [name]  Location: [facility] Patient #: [id number] DOB: 01-Dec-1955 Widowed / Language: Cleophus Molt / Race: White Female  History of Present Illness  Patient words: hernia.  The patient is a 59 year old female who presents with an incisional hernia. This patient is referred by Dr. Hilma Favors. She has multiple chronic medical issues including cirrhosis and morbid obesity. She has had 2 separate hernia repairs with mesh on her abdomen. The most recently perform surgery was back in December 2015 where she had an open incisional hernia repair with mesh by Dr. Aviva Signs. She reports having pain almost immediately postop after working on incentive spirometer and drainage from her incision from his month. She shortly noticed a recurrent hernia after her surgery. She has no nausea or vomiting. Because of her anxiety, she is continually scratched on her abdominal wall. She reports this is creating multiple small open wounds. She has no nausea, vomiting, or obstructive symptoms. She has minimal abdominal discomfort.   Past Surgical History  Appendectomy Foot Surgery Bilateral. Gallbladder Surgery - Laparoscopic Hysterectomy (not due to cancer) - Complete Shoulder Surgery Right. Thyroid Surgery Tonsillectomy  Allergies  Adhesive Tape *MEDICAL DEVICES AND SUPPLIES* Percogesic *COUGH/COLD/ALLERGY* Clarithromycin *CHEMICALS* Clindamycin Palmitate HCl *ANTI-INFECTIVE AGENTS - MISC.* Neosporin *OPHTHALMIC AGENTS* Penicillamine *ASSORTED CLASSES*  Medication History  Albuterol Sulfate (0.63MG/3ML Nebulized Soln, Inhalation) Active. Amlodipine Besylate-Valsartan (10-320MG Tablet, Oral) Active. Aspirin EC (81MG Tablet DR, Oral) Active. Wellbutrin XL (300MG Tablet ER 24HR, Oral) Active. CeleBREX (200MG Capsule, Oral) Active. Welchol (625MG Tablet, Oral) Active. Diltiazem CD (240MG Capsule ER 24HR, Oral) Active. DULoxetine HCl (60MG Capsule DR Part,  Oral) Active. Lidex (0.05% Cream, External) Active. Furosemide (80MG Tablet, Oral) Active. Lantus SoloStar (100UNIT/ML Soln Pen-inj, Subcutaneous) Active. Levothyroxine Sodium (200MCG Tablet, Oral) Active. Levothyroxine Sodium (75MCG Tablet, Oral) Active. Cytomel (5MCG Tablet, Oral) Active. MetFORMIN HCl (1000MG Tablet, Oral) Active. Oxybutynin Chloride (5MG Tablet, Oral) Active. Pantoprazole Sodium (40MG Tablet DR, Oral) Active. Voltaren (1% Gel, Transdermal) Active. Medications Reconciled  Social History Caffeine use Carbonated beverages, Tea. No alcohol use No drug use Tobacco use Never smoker.  Family History Alcohol Abuse Father. Bleeding disorder Mother. Depression Mother. Heart Disease Family Members In General. Heart disease in female family member before age 54 Hypertension Mother.  Review of Systems  General Present- Fatigue. Not Present- Appetite Loss, Chills, Fever, Night Sweats, Weight Gain and Weight Loss. Skin Present- Dryness and Non-Healing Wounds. Not Present- Change in Wart/Mole, Hives, Jaundice, New Lesions, Rash and Ulcer. HEENT Present- Seasonal Allergies, Sinus Pain, Visual Disturbances and Wears glasses/contact lenses. Not Present- Earache, Hearing Loss, Hoarseness, Nose Bleed, Oral Ulcers, Ringing in the Ears, Sore Throat and Yellow Eyes. Breast Not Present- Breast Mass, Breast Pain, Nipple Discharge and Skin Changes. Cardiovascular Present- Rapid Heart Rate, Shortness of Breath and Swelling of Extremities. Not Present- Chest Pain, Difficulty Breathing Lying Down, Leg Cramps and Palpitations. Gastrointestinal Present- Difficulty Swallowing, Indigestion and Nausea. Not Present- Abdominal Pain, Bloating, Bloody Stool, Change in Bowel Habits, Chronic diarrhea, Constipation, Excessive gas, Gets full quickly at meals, Hemorrhoids, Rectal Pain and Vomiting. Female Genitourinary Present- Urgency. Not Present- Frequency, Nocturia, Painful  Urination and Pelvic Pain. Musculoskeletal Present- Back Pain, Joint Pain and Swelling of Extremities. Not Present- Joint Stiffness, Muscle Pain and Muscle Weakness. Neurological Present- Tingling. Not Present- Decreased Memory, Fainting, Headaches, Numbness, Seizures, Tremor, Trouble walking and Weakness. Psychiatric Present- Anxiety and Depression. Not Present- Bipolar, Change in Sleep Pattern, Fearful and Frequent crying. Endocrine Present- Cold Intolerance. Not Present- Excessive Hunger, Hair Changes,  Heat Intolerance, Hot flashes and New Diabetes. Hematology Not Present- Easy Bruising, Excessive bleeding, Gland problems, HIV and Persistent Infections.   Vitals  09/08/2014 3:02 PM Weight: 315 lb Height: 65in Body Surface Area: 2.56 m Body Mass Index: 52.42 kg/m Temp.: 97.48F(Temporal)  Pulse: 90 (Regular)  BP: 130/74 (Sitting, Left Arm, Standard)    Physical Exam  General Mental Status-Alert. General Appearance-Consistent with stated age. Note: Morbidly obese Hydration-Well hydrated. Voice-Normal.  Head and Neck Head-normocephalic, atraumatic with no lesions or palpable masses. Trachea-midline.  Eye Eyeball - Bilateral-Extraocular movements intact. Sclera/Conjunctiva - Bilateral-No scleral icterus.  Chest and Lung Exam Chest and lung exam reveals -quiet, even and easy respiratory effort with no use of accessory muscles and on auscultation, normal breath sounds, no adventitious sounds and normal vocal resonance. Inspection Chest Wall - Normal. Back - normal.  Cardiovascular Cardiovascular examination reveals -normal heart sounds, regular rate and rhythm with no murmurs and normal pedal pulses bilaterally.  Abdomen Inspection Skin - Scar - no surgical scars. Hernias - Incisional - Incarcerated. Note: There is a large, chronically incarcerated incisional hernia which is nontender. There are multiple open wounds throughout her abdominal  wall from her scratching. These all are healing with scabs and no erythema. Palpation/Percussion Palpation and Percussion of the abdomen reveal - Soft, Non Tender, No Rebound tenderness, No Rigidity (guarding) and No hepatosplenomegaly. Auscultation Auscultation of the abdomen reveals - Bowel sounds normal.  Neurologic Neurologic evaluation reveals -alert and oriented x 3 with no impairment of recent or remote memory. Mental Status-Normal.  Musculoskeletal Normal Exam - Left-Upper Extremity Strength Normal and Lower Extremity Strength Normal. Normal Exam - Right-Upper Extremity Strength Normal, Lower Extremity Weakness.    Assessment & Plan  RECURRENT INCISIONAL HERNIA WITH INCARCERATION (552.21  K43.0) Impression: I discussed this with her in detail. She is certainly at risk for straining ablation of her bowel given her morbid obesity and the amount of small and large bowel in the hernia on her recent CAT scan. She is also at risk for wound infection given her obesity, comorbidities, and wounds on her abdominal wall. I would like to try to repair of the hernia laparoscopically with mesh. I explained the risks which includes but is not limited to bleeding, infection, injury to try and structures, need to convert to an open procedure with component separation and multiple drains, cardiopulmonary issues, DVT, etc. She also has a high risk of recurrence. She understands and wishes to proceed with surgery. I would like to wait at least 1 month to allow her abdominal wound to completely heal preoperatively

## 2014-10-15 ENCOUNTER — Ambulatory Visit: Payer: Medicare Other | Admitting: Cardiology

## 2014-10-15 ENCOUNTER — Encounter (HOSPITAL_COMMUNITY)
Admission: RE | Disposition: A | Discharge status: Home / Self Care | Payer: Self-pay | Source: Ambulatory Visit | Attending: Surgery

## 2014-10-15 ENCOUNTER — Encounter (HOSPITAL_COMMUNITY): Payer: Self-pay | Admitting: *Deleted

## 2014-10-15 ENCOUNTER — Ambulatory Visit (HOSPITAL_COMMUNITY): Payer: Medicare Other | Admitting: Vascular Surgery

## 2014-10-15 ENCOUNTER — Observation Stay (HOSPITAL_COMMUNITY)
Admission: RE | Admit: 2014-10-15 | Discharge: 2014-10-17 | Disposition: A | Discharge status: Home / Self Care | Payer: Medicare Other | Source: Ambulatory Visit | Attending: Surgery | Admitting: Surgery

## 2014-10-15 DIAGNOSIS — Z6841 Body Mass Index (BMI) 40.0 and over, adult: Secondary | ICD-10-CM | POA: Diagnosis not present

## 2014-10-15 DIAGNOSIS — K432 Incisional hernia without obstruction or gangrene: Principal | ICD-10-CM | POA: Diagnosis present

## 2014-10-15 DIAGNOSIS — E039 Hypothyroidism, unspecified: Secondary | ICD-10-CM | POA: Insufficient documentation

## 2014-10-15 DIAGNOSIS — G4730 Sleep apnea, unspecified: Secondary | ICD-10-CM | POA: Insufficient documentation

## 2014-10-15 DIAGNOSIS — I10 Essential (primary) hypertension: Secondary | ICD-10-CM | POA: Diagnosis not present

## 2014-10-15 DIAGNOSIS — K219 Gastro-esophageal reflux disease without esophagitis: Secondary | ICD-10-CM | POA: Insufficient documentation

## 2014-10-15 DIAGNOSIS — K7460 Unspecified cirrhosis of liver: Secondary | ICD-10-CM | POA: Insufficient documentation

## 2014-10-15 DIAGNOSIS — F419 Anxiety disorder, unspecified: Secondary | ICD-10-CM | POA: Insufficient documentation

## 2014-10-15 DIAGNOSIS — R21 Rash and other nonspecific skin eruption: Secondary | ICD-10-CM | POA: Diagnosis not present

## 2014-10-15 DIAGNOSIS — T364X5A Adverse effect of tetracyclines, initial encounter: Secondary | ICD-10-CM | POA: Insufficient documentation

## 2014-10-15 DIAGNOSIS — E119 Type 2 diabetes mellitus without complications: Secondary | ICD-10-CM | POA: Diagnosis not present

## 2014-10-15 HISTORY — PX: INSERTION OF MESH: SHX5868

## 2014-10-15 HISTORY — PX: INCISIONAL HERNIA REPAIR: SHX193

## 2014-10-15 LAB — GLUCOSE, CAPILLARY
GLUCOSE-CAPILLARY: 176 mg/dL — AB (ref 70–99)
Glucose-Capillary: 210 mg/dL — ABNORMAL HIGH (ref 70–99)
Glucose-Capillary: 163 mg/dL — ABNORMAL HIGH (ref 70–99)
Glucose-Capillary: 213 mg/dL — ABNORMAL HIGH (ref 70–99)
Glucose-Capillary: 302 mg/dL — ABNORMAL HIGH (ref 70–99)
Glucose-Capillary: 263 mg/dL — ABNORMAL HIGH (ref 70–99)

## 2014-10-15 SURGERY — REPAIR, HERNIA, INCISIONAL, LAPAROSCOPIC
Anesthesia: General | Site: Abdomen

## 2014-10-15 MED ORDER — FENTANYL CITRATE (PF) 250 MCG/5ML IJ SOLN
INTRAMUSCULAR | Status: AC
  Filled 2014-10-15: qty 5

## 2014-10-15 MED ORDER — DILTIAZEM HCL ER COATED BEADS 240 MG PO CP24
240.0000 mg | ORAL_CAPSULE | Freq: Every day | ORAL | Status: DC
Start: 2014-10-16 — End: 2014-10-17
  Administered 2014-10-16: 240 mg via ORAL
  Filled 2014-10-15 (×2): qty 1

## 2014-10-15 MED ORDER — INSULIN ASPART 100 UNIT/ML ~~LOC~~ SOLN
10.0000 [IU] | SUBCUTANEOUS | Status: DC
  Filled 2014-10-15: qty 0.1

## 2014-10-15 MED ORDER — SUCCINYLCHOLINE CHLORIDE 20 MG/ML IJ SOLN
INTRAMUSCULAR | Status: DC | PRN
  Administered 2014-10-15: 140 mg via INTRAVENOUS

## 2014-10-15 MED ORDER — DULOXETINE HCL 60 MG PO CPEP
120.0000 mg | ORAL_CAPSULE | Freq: Every day | ORAL | Status: DC
  Administered 2014-10-16: 120 mg via ORAL
  Filled 2014-10-15: qty 2

## 2014-10-15 MED ORDER — ROCURONIUM BROMIDE 100 MG/10ML IV SOLN
INTRAVENOUS | Status: DC | PRN
  Administered 2014-10-15: 5 mg via INTRAVENOUS
  Administered 2014-10-15: 10 mg via INTRAVENOUS
  Administered 2014-10-15: 40 mg via INTRAVENOUS

## 2014-10-15 MED ORDER — OXYCODONE HCL 5 MG/5ML PO SOLN: 5.0000 mg | Freq: Once | ORAL | Status: AC | PRN

## 2014-10-15 MED ORDER — DIPHENHYDRAMINE HCL 25 MG PO CAPS
50.0000 mg | ORAL_CAPSULE | Freq: Four times a day (QID) | ORAL | Status: DC | PRN
  Administered 2014-10-15: 50 mg via ORAL
  Filled 2014-10-15: qty 2

## 2014-10-15 MED ORDER — PROPOFOL 10 MG/ML IV BOLUS
INTRAVENOUS | Status: AC
  Filled 2014-10-15: qty 20

## 2014-10-15 MED ORDER — LABETALOL HCL 5 MG/ML IV SOLN
INTRAVENOUS | Status: AC
  Filled 2014-10-15: qty 4

## 2014-10-15 MED ORDER — LIDOCAINE HCL (CARDIAC) 20 MG/ML IV SOLN
INTRAVENOUS | Status: DC | PRN
  Administered 2014-10-15: 50 mg via INTRAVENOUS

## 2014-10-15 MED ORDER — HYDROCODONE-ACETAMINOPHEN 5-325 MG PO TABS
1.0000 | ORAL_TABLET | ORAL | Status: DC | PRN
  Administered 2014-10-15 – 2014-10-17 (×6): 2 via ORAL
  Filled 2014-10-15 (×6): qty 2

## 2014-10-15 MED ORDER — ONDANSETRON HCL 4 MG/2ML IJ SOLN
INTRAMUSCULAR | Status: AC
  Filled 2014-10-15: qty 2

## 2014-10-15 MED ORDER — ALBUTEROL SULFATE (2.5 MG/3ML) 0.083% IN NEBU: 2.5000 mg | INHALATION_SOLUTION | Freq: Four times a day (QID) | RESPIRATORY_TRACT | Status: DC | PRN

## 2014-10-15 MED ORDER — ROCURONIUM BROMIDE 50 MG/5ML IV SOLN
INTRAVENOUS | Status: AC
  Filled 2014-10-15: qty 1

## 2014-10-15 MED ORDER — 0.9 % SODIUM CHLORIDE (POUR BTL) OPTIME
TOPICAL | Status: DC | PRN
  Administered 2014-10-15: 1000 mL

## 2014-10-15 MED ORDER — LACTATED RINGERS IV SOLN: INTRAVENOUS | Status: DC

## 2014-10-15 MED ORDER — ONDANSETRON HCL 4 MG/2ML IJ SOLN: 4.0000 mg | Freq: Four times a day (QID) | INTRAMUSCULAR | Status: DC | PRN

## 2014-10-15 MED ORDER — NEOSTIGMINE METHYLSULFATE 10 MG/10ML IV SOLN
INTRAVENOUS | Status: AC
  Filled 2014-10-15: qty 1

## 2014-10-15 MED ORDER — HYDROMORPHONE HCL 1 MG/ML IJ SOLN
1.0000 mg | INTRAMUSCULAR | Status: DC | PRN
  Administered 2014-10-15 (×2): 1 mg via INTRAVENOUS
  Filled 2014-10-15 (×2): qty 1

## 2014-10-15 MED ORDER — BUPROPION HCL ER (XL) 150 MG PO TB24
300.0000 mg | ORAL_TABLET | Freq: Every day | ORAL | Status: DC
  Administered 2014-10-16: 300 mg via ORAL
  Filled 2014-10-15: qty 2

## 2014-10-15 MED ORDER — BUPIVACAINE-EPINEPHRINE (PF) 0.25% -1:200000 IJ SOLN
INTRAMUSCULAR | Status: AC
  Filled 2014-10-15: qty 30

## 2014-10-15 MED ORDER — PANTOPRAZOLE SODIUM 40 MG PO TBEC
40.0000 mg | DELAYED_RELEASE_TABLET | Freq: Every day | ORAL | Status: DC
  Administered 2014-10-16: 40 mg via ORAL
  Filled 2014-10-15: qty 1

## 2014-10-15 MED ORDER — SODIUM CHLORIDE 0.9 % IV SOLN: 250.0000 [IU] | INTRAVENOUS | Status: DC | PRN

## 2014-10-15 MED ORDER — OXYCODONE HCL 5 MG PO TABS
ORAL_TABLET | ORAL | Status: AC
  Filled 2014-10-15: qty 1

## 2014-10-15 MED ORDER — PHENYLEPHRINE HCL 10 MG/ML IJ SOLN
INTRAMUSCULAR | Status: DC | PRN
  Administered 2014-10-15 (×2): 40 ug via INTRAVENOUS

## 2014-10-15 MED ORDER — MIDAZOLAM HCL 5 MG/5ML IJ SOLN
INTRAMUSCULAR | Status: DC | PRN
  Administered 2014-10-15: 2 mg via INTRAVENOUS

## 2014-10-15 MED ORDER — GLYCOPYRROLATE 0.2 MG/ML IJ SOLN
INTRAMUSCULAR | Status: DC | PRN
  Administered 2014-10-15: .8 mg via INTRAVENOUS

## 2014-10-15 MED ORDER — AMLODIPINE BESYLATE-VALSARTAN 10-320 MG PO TABS: 1.0000 | ORAL_TABLET | Freq: Every day | ORAL | Status: DC

## 2014-10-15 MED ORDER — DEXAMETHASONE SODIUM PHOSPHATE 10 MG/ML IJ SOLN
INTRAMUSCULAR | Status: DC | PRN
  Administered 2014-10-15: 10 mg via INTRAVENOUS

## 2014-10-15 MED ORDER — LABETALOL HCL 5 MG/ML IV SOLN
INTRAVENOUS | Status: DC | PRN
  Administered 2014-10-15: 5 mg via INTRAVENOUS

## 2014-10-15 MED ORDER — HYDROMORPHONE HCL 1 MG/ML IJ SOLN
INTRAMUSCULAR | Status: AC
  Filled 2014-10-15: qty 1

## 2014-10-15 MED ORDER — ONDANSETRON HCL 4 MG PO TABS: 4.0000 mg | ORAL_TABLET | Freq: Four times a day (QID) | ORAL | Status: DC | PRN

## 2014-10-15 MED ORDER — HYDROMORPHONE HCL 1 MG/ML IJ SOLN
0.2500 mg | INTRAMUSCULAR | Status: DC | PRN
  Administered 2014-10-15 (×4): 0.5 mg via INTRAVENOUS

## 2014-10-15 MED ORDER — OXYCODONE HCL 5 MG PO TABS
5.0000 mg | ORAL_TABLET | Freq: Once | ORAL | Status: AC | PRN
  Administered 2014-10-15: 5 mg via ORAL

## 2014-10-15 MED ORDER — DEXAMETHASONE SODIUM PHOSPHATE 4 MG/ML IJ SOLN
INTRAMUSCULAR | Status: AC
  Filled 2014-10-15: qty 1

## 2014-10-15 MED ORDER — PROMETHAZINE HCL 25 MG/ML IJ SOLN: 6.2500 mg | INTRAMUSCULAR | Status: DC | PRN

## 2014-10-15 MED ORDER — FENTANYL CITRATE (PF) 100 MCG/2ML IJ SOLN
INTRAMUSCULAR | Status: DC | PRN
  Administered 2014-10-15: 100 ug via INTRAVENOUS
  Administered 2014-10-15 (×2): 50 ug via INTRAVENOUS

## 2014-10-15 MED ORDER — NEOSTIGMINE METHYLSULFATE 10 MG/10ML IV SOLN
INTRAVENOUS | Status: DC | PRN
  Administered 2014-10-15: 5 mg via INTRAVENOUS

## 2014-10-15 MED ORDER — SUCCINYLCHOLINE CHLORIDE 20 MG/ML IJ SOLN
INTRAMUSCULAR | Status: AC
  Filled 2014-10-15: qty 1

## 2014-10-15 MED ORDER — MIDAZOLAM HCL 2 MG/2ML IJ SOLN
INTRAMUSCULAR | Status: AC
  Filled 2014-10-15: qty 2

## 2014-10-15 MED ORDER — POTASSIUM CHLORIDE IN NACL 20-0.9 MEQ/L-% IV SOLN
INTRAVENOUS | Status: DC
  Administered 2014-10-15 – 2014-10-16 (×3): via INTRAVENOUS
  Filled 2014-10-15 (×5): qty 1000

## 2014-10-15 MED ORDER — IRBESARTAN 300 MG PO TABS
300.0000 mg | ORAL_TABLET | Freq: Every day | ORAL | Status: DC
  Administered 2014-10-16: 300 mg via ORAL
  Filled 2014-10-15: qty 1

## 2014-10-15 MED ORDER — SODIUM CHLORIDE 0.9 % IJ SOLN
INTRAMUSCULAR | Status: AC
  Filled 2014-10-15: qty 10

## 2014-10-15 MED ORDER — FUROSEMIDE 20 MG PO TABS
20.0000 mg | ORAL_TABLET | Freq: Every day | ORAL | Status: DC
  Administered 2014-10-15 – 2014-10-16 (×2): 20 mg via ORAL
  Filled 2014-10-15 (×2): qty 1

## 2014-10-15 MED ORDER — INSULIN ASPART 100 UNIT/ML ~~LOC~~ SOLN
0.0000 [IU] | SUBCUTANEOUS | Status: DC
  Administered 2014-10-15: 11 [IU] via SUBCUTANEOUS
  Administered 2014-10-15: 10 [IU] via SUBCUTANEOUS
  Administered 2014-10-15: 15 [IU] via SUBCUTANEOUS
  Administered 2014-10-15: 7 [IU] via SUBCUTANEOUS
  Administered 2014-10-16 (×2): 4 [IU] via SUBCUTANEOUS

## 2014-10-15 MED ORDER — PHENYLEPHRINE 40 MCG/ML (10ML) SYRINGE FOR IV PUSH (FOR BLOOD PRESSURE SUPPORT)
PREFILLED_SYRINGE | INTRAVENOUS | Status: AC
  Filled 2014-10-15: qty 10

## 2014-10-15 MED ORDER — GLYCOPYRROLATE 0.2 MG/ML IJ SOLN
INTRAMUSCULAR | Status: AC
  Filled 2014-10-15: qty 4

## 2014-10-15 MED ORDER — EPHEDRINE SULFATE 50 MG/ML IJ SOLN
INTRAMUSCULAR | Status: AC
  Filled 2014-10-15: qty 1

## 2014-10-15 MED ORDER — LACTATED RINGERS IV SOLN
INTRAVENOUS | Status: DC | PRN
  Administered 2014-10-15: 08:00:00 via INTRAVENOUS

## 2014-10-15 MED ORDER — PHENYLEPHRINE HCL 10 MG/ML IJ SOLN
10.0000 mg | INTRAVENOUS | Status: DC | PRN
  Administered 2014-10-15: 40 ug/min via INTRAVENOUS

## 2014-10-15 MED ORDER — AMLODIPINE BESYLATE 10 MG PO TABS
10.0000 mg | ORAL_TABLET | Freq: Every day | ORAL | Status: DC
  Administered 2014-10-16: 10 mg via ORAL
  Filled 2014-10-15: qty 1

## 2014-10-15 MED ORDER — EPHEDRINE SULFATE 50 MG/ML IJ SOLN
INTRAMUSCULAR | Status: DC | PRN
  Administered 2014-10-15 (×2): 5 mg via INTRAVENOUS

## 2014-10-15 MED ORDER — ONDANSETRON HCL 4 MG/2ML IJ SOLN
INTRAMUSCULAR | Status: DC | PRN
  Administered 2014-10-15: 4 mg via INTRAVENOUS

## 2014-10-15 MED ORDER — BUPIVACAINE-EPINEPHRINE 0.25% -1:200000 IJ SOLN
INTRAMUSCULAR | Status: DC | PRN
  Administered 2014-10-15: 20 mL

## 2014-10-15 SURGICAL SUPPLY — 49 items
APPLIER CLIP 5 13 M/L LIGAMAX5 (MISCELLANEOUS)
APPLIER CLIP ROT 10 11.4 M/L (STAPLE)
BINDER ABDOMINAL 12 ML 46-62 (SOFTGOODS) ×6 IMPLANT
BLADE SURG ROTATE 9660 (MISCELLANEOUS) IMPLANT
CANISTER SUCTION 2500CC (MISCELLANEOUS) IMPLANT
CHLORAPREP W/TINT 26ML (MISCELLANEOUS) ×3 IMPLANT
CLIP APPLIE 5 13 M/L LIGAMAX5 (MISCELLANEOUS) IMPLANT
CLIP APPLIE ROT 10 11.4 M/L (STAPLE) IMPLANT
COVER SURGICAL LIGHT HANDLE (MISCELLANEOUS) ×3 IMPLANT
DECANTER SPIKE VIAL GLASS SM (MISCELLANEOUS) ×3 IMPLANT
DEVICE SECURE STRAP 25 ABSORB (INSTRUMENTS) ×9 IMPLANT
DEVICE TROCAR PUNCTURE CLOSURE (ENDOMECHANICALS) ×3 IMPLANT
DRAPE LAPAROSCOPIC ABDOMINAL (DRAPES) ×3 IMPLANT
ELECT REM PT RETURN 9FT ADLT (ELECTROSURGICAL) ×3
ELECTRODE REM PT RTRN 9FT ADLT (ELECTROSURGICAL) ×1 IMPLANT
GLOVE BIO SURGEON STRL SZ8 (GLOVE) ×3 IMPLANT
GLOVE BIOGEL PI IND STRL 7.0 (GLOVE) ×1 IMPLANT
GLOVE BIOGEL PI IND STRL 8 (GLOVE) ×1 IMPLANT
GLOVE BIOGEL PI INDICATOR 7.0 (GLOVE) ×2
GLOVE BIOGEL PI INDICATOR 8 (GLOVE) ×2
GLOVE SURG SIGNA 7.5 PF LTX (GLOVE) ×3 IMPLANT
GLOVE SURG SS PI 7.0 STRL IVOR (GLOVE) ×3 IMPLANT
GOWN STRL REUS W/ TWL LRG LVL3 (GOWN DISPOSABLE) ×2 IMPLANT
GOWN STRL REUS W/ TWL XL LVL3 (GOWN DISPOSABLE) ×1 IMPLANT
GOWN STRL REUS W/TWL LRG LVL3 (GOWN DISPOSABLE) ×4
GOWN STRL REUS W/TWL XL LVL3 (GOWN DISPOSABLE) ×2
KIT BASIN OR (CUSTOM PROCEDURE TRAY) ×3 IMPLANT
KIT ROOM TURNOVER OR (KITS) ×3 IMPLANT
LIQUID BAND (GAUZE/BANDAGES/DRESSINGS) ×6 IMPLANT
MARKER SKIN DUAL TIP RULER LAB (MISCELLANEOUS) ×3 IMPLANT
MESH VENTRALIGHT ST 10X13IN (Mesh General) ×3 IMPLANT
NEEDLE SPNL 22GX3.5 QUINCKE BK (NEEDLE) ×3 IMPLANT
NS IRRIG 1000ML POUR BTL (IV SOLUTION) ×3 IMPLANT
PAD ARMBOARD 7.5X6 YLW CONV (MISCELLANEOUS) ×6 IMPLANT
SCALPEL HARMONIC ACE (MISCELLANEOUS) IMPLANT
SCISSORS LAP 5X35 DISP (ENDOMECHANICALS) ×3 IMPLANT
SET IRRIG TUBING LAPAROSCOPIC (IRRIGATION / IRRIGATOR) IMPLANT
SHEARS HARMONIC ACE PLUS 36CM (ENDOMECHANICALS) ×3 IMPLANT
SLEEVE ENDOPATH XCEL 5M (ENDOMECHANICALS) ×9 IMPLANT
SUT MON AB 4-0 PC3 18 (SUTURE) ×6 IMPLANT
SUT NOVA NAB GS-21 0 18 T12 DT (SUTURE) ×3 IMPLANT
TOWEL OR 17X24 6PK STRL BLUE (TOWEL DISPOSABLE) ×3 IMPLANT
TOWEL OR 17X26 10 PK STRL BLUE (TOWEL DISPOSABLE) ×3 IMPLANT
TRAY FOLEY CATH 16FR SILVER (SET/KITS/TRAYS/PACK) IMPLANT
TRAY LAPAROSCOPIC (CUSTOM PROCEDURE TRAY) ×3 IMPLANT
TROCAR BLADELESS 11MM (ENDOMECHANICALS) ×3 IMPLANT
TROCAR XCEL NON-BLD 11X100MML (ENDOMECHANICALS) ×3 IMPLANT
TROCAR XCEL NON-BLD 5MMX100MML (ENDOMECHANICALS) ×3 IMPLANT
TUBING INSUFFLATION (TUBING) ×3 IMPLANT

## 2014-10-15 NOTE — Transfer of Care (Signed)
Immediate Anesthesia Transfer of Care Note  Patient: [name] [name]  Procedure(s) Performed: Procedure(s): LAPAROSCOPIC INCISIONAL HERNIA REPAIR (N/A) INSERTION OF MESH (N/A)  Patient Location: PACU  Anesthesia Type:General  Level of Consciousness:  sedated, patient cooperative and responds to stimulation  Airway & Oxygen Therapy:Patient Spontanous Breathing and Patient connected to face mask oxgen  Post-op Assessment:  Report given to PACU RN and Post -op Vital signs reviewed and stable  Post vital signs:  Reviewed and stable  Last Vitals:  Filed Vitals:   10/15/14 0726  BP: 166/73  Pulse:   Temp:   Resp:     Complications: No apparent anesthesia complications

## 2014-10-15 NOTE — Anesthesia Postprocedure Evaluation (Signed)
  Anesthesia Post-op Note  Patient: [name] [name]  Procedure(s) Performed: Procedure(s): LAPAROSCOPIC INCISIONAL HERNIA REPAIR (N/A) INSERTION OF MESH (N/A)  Patient Location: PACU  Anesthesia Type:General  Level of Consciousness: awake, alert  and patient cooperative  Airway and Oxygen Therapy: Patient Spontanous Breathing  Post-op Pain: mild  Post-op Assessment: Post-op Vital signs reviewed  Post-op Vital Signs: stable  Last Vitals:  Filed Vitals:   10/15/14 1230  BP: 121/45  Pulse: 84  Temp:   Resp: 11    Complications: No apparent anesthesia complications

## 2014-10-15 NOTE — Anesthesia Preprocedure Evaluation (Addendum)
Anesthesia Evaluation  Patient identified by MRN, date of birth, ID band Patient awake    Reviewed: Allergy & Precautions, NPO status , Patient's Chart, lab work & pertinent test results  Airway Mallampati: I   Neck ROM: Full    Dental  (+) Teeth Intact   Pulmonary asthma , sleep apnea ,  breath sounds clear to auscultation        Cardiovascular hypertension, Rhythm:Regular Rate:Normal     Neuro/Psych Anxiety    GI/Hepatic GERD-  ,(+) Cirrhosis -      ,   Endo/Other  diabetes, Poorly ControlledHypothyroidism Morbid obesity  Renal/GU      Musculoskeletal   Abdominal (+) + obese,   Peds  Hematology   Anesthesia Other Findings   Reproductive/Obstetrics                           Anesthesia Physical Anesthesia Plan  ASA: III  Anesthesia Plan: General   Post-op Pain Management:    Induction: Intravenous  Airway Management Planned: Oral ETT  Additional Equipment:   Intra-op Plan:   Post-operative Plan: Extubation in OR and Possible Post-op intubation/ventilation  Informed Consent: I have reviewed the patients History and Physical, chart, labs and discussed the procedure including the risks, benefits and alternatives for the proposed anesthesia with the patient or authorized representative who has indicated his/her understanding and acceptance.   Dental advisory given  Plan Discussed with: CRNA and Surgeon  Anesthesia Plan Comments:         Anesthesia Quick Evaluation

## 2014-10-15 NOTE — Anesthesia Procedure Notes (Signed)
Procedure Name: Intubation Date/Time: 10/15/2014 8:37 AM Performed by: Anne Fu Pre-anesthesia Checklist: Patient identified, Emergency Drugs available, Suction available, Patient being monitored and Timeout performed Patient Re-evaluated:Patient Re-evaluated prior to inductionOxygen Delivery Method: Circle system utilized Preoxygenation: Pre-oxygenation with 100% oxygen Intubation Type: IV induction and Rapid sequence Ventilation: Mask ventilation without difficulty Laryngoscope Size: Mac and 4 Grade View: Grade I Tube type: Oral Tube size: 7.5 mm Number of attempts: 1 Airway Equipment and Method: Stylet Placement Confirmation: ETT inserted through vocal cords under direct vision,  positive ETCO2,  CO2 detector and breath sounds checked- equal and bilateral Secured at: 21 cm Tube secured with: Tape Dental Injury: Teeth and Oropharynx as per pre-operative assessment

## 2014-10-15 NOTE — Addendum Note (Signed)
Addendum  created 10/15/14 1309 by Anne Fu, CRNA   Modules edited: Anesthesia Medication Administration

## 2014-10-15 NOTE — Interval H&P Note (Signed)
History and Physical Interval Note: no change in H and P  10/15/2014 8:07 AM  [name] [name]  has presented today for surgery, with the diagnosis of Recurrent Incisional Hernia  The various methods of treatment have been discussed with the patient and family. After consideration of risks, benefits and other options for treatment, the patient has consented to  Procedure(s): [place] (N/A) INSERTION OF MESH (N/A) as a surgical intervention .  The patient's history has been reviewed, patient examined, no change in status, stable for surgery.  I have reviewed the patient's chart and labs.  Questions were answered to the patient's satisfaction.     [name] A

## 2014-10-15 NOTE — Op Note (Signed)
LAPAROSCOPIC INCISIONAL HERNIA REPAIR, INSERTION OF MESH  Procedure Note  [name] [name] 10/15/2014   Pre-op Diagnosis: Recurrent Incisional Hernia     Post-op Diagnosis: same  Procedure(s): LAPAROSCOPIC INCISIONAL HERNIA REPAIR INSERTION OF MESH  Surgeon(s): [name], MD  Anesthesia: General  Staff:  Circulator: Glean Hess, RN Scrub Person: Jesse Sans, CST; Beryle Lathe, RN  Estimated Blood Loss: Minimal               33 cm x 25.4 cm Bard Ventralite mesh used          [employer] A   Date: 10/15/2014  Time: 10:28 AM

## 2014-10-16 ENCOUNTER — Encounter (HOSPITAL_COMMUNITY): Payer: Self-pay | Admitting: Surgery

## 2014-10-16 DIAGNOSIS — K432 Incisional hernia without obstruction or gangrene: Secondary | ICD-10-CM | POA: Diagnosis not present

## 2014-10-16 LAB — BASIC METABOLIC PANEL
Anion gap: 8 (ref 5–15)
BUN: 9 mg/dL (ref 6–23)
CO2: 24 mmol/L (ref 19–32)
Calcium: 8.7 mg/dL (ref 8.4–10.5)
Chloride: 100 mmol/L (ref 96–112)
Creatinine, Ser: 0.95 mg/dL (ref 0.50–1.10)
GFR calc Af Amer: 75 mL/min — ABNORMAL LOW (ref >90)
GFR calc non Af Amer: 64 mL/min — ABNORMAL LOW (ref >90)
Glucose, Bld: 206 mg/dL — ABNORMAL HIGH (ref 70–99)
Potassium: 4.5 mmol/L (ref 3.5–5.1)
Sodium: 132 mmol/L — ABNORMAL LOW (ref 135–145)

## 2014-10-16 LAB — CBC WITH DIFFERENTIAL/PLATELET
Basophils Absolute: 0 10*3/uL (ref 0.0–0.1)
Basophils Relative: 0 % (ref 0–1)
EOS ABS: 0 10*3/uL (ref 0.0–0.7)
Eosinophils Relative: 0 % (ref 0–5)
HCT: 32 % — ABNORMAL LOW (ref 36.0–46.0)
HEMOGLOBIN: 10.2 g/dL — AB (ref 12.0–15.0)
LYMPHS ABS: 2.1 10*3/uL (ref 0.7–4.0)
Lymphocytes Relative: 17 % (ref 12–46)
MCH: 27.3 pg (ref 26.0–34.0)
MCHC: 31.9 g/dL (ref 30.0–36.0)
MCV: 85.6 fL (ref 78.0–100.0)
MONOS PCT: 6 % (ref 3–12)
Monocytes Absolute: 0.7 10*3/uL (ref 0.1–1.0)
NEUTROS PCT: 77 % (ref 43–77)
Neutro Abs: 9.2 10*3/uL — ABNORMAL HIGH (ref 1.7–7.7)
Platelets: 204 10*3/uL (ref 150–400)
RBC: 3.74 MIL/uL — AB (ref 3.87–5.11)
RDW: 14.7 % (ref 11.5–15.5)
WBC: 12 10*3/uL — ABNORMAL HIGH (ref 4.0–10.5)

## 2014-10-16 LAB — GLUCOSE, CAPILLARY
GLUCOSE-CAPILLARY: 192 mg/dL — AB (ref 70–99)
Glucose-Capillary: 147 mg/dL — ABNORMAL HIGH (ref 70–99)
Glucose-Capillary: 162 mg/dL — ABNORMAL HIGH (ref 70–99)
Glucose-Capillary: 166 mg/dL — ABNORMAL HIGH (ref 70–99)
Glucose-Capillary: 177 mg/dL — ABNORMAL HIGH (ref 70–99)
Glucose-Capillary: 192 mg/dL — ABNORMAL HIGH (ref 70–99)

## 2014-10-16 MED ORDER — INSULIN ASPART 100 UNIT/ML ~~LOC~~ SOLN: 0.0000 [IU] | Freq: Every day | SUBCUTANEOUS | Status: DC

## 2014-10-16 MED ORDER — INSULIN ASPART 100 UNIT/ML ~~LOC~~ SOLN
0.0000 [IU] | Freq: Three times a day (TID) | SUBCUTANEOUS | Status: DC
  Administered 2014-10-16 (×2): 4 [IU] via SUBCUTANEOUS
  Administered 2014-10-16: 3 [IU] via SUBCUTANEOUS

## 2014-10-16 MED ORDER — INSULIN ASPART 100 UNIT/ML ~~LOC~~ SOLN
6.0000 [IU] | Freq: Three times a day (TID) | SUBCUTANEOUS | Status: DC
  Administered 2014-10-16 (×3): 6 [IU] via SUBCUTANEOUS

## 2014-10-16 NOTE — Progress Notes (Signed)
Inpatient Diabetes Program Recommendations  AACE/ADA: New Consensus Statement on Inpatient Glycemic Control (2013)  Target Ranges:  Prepandial:   less than 140 mg/dL      Peak postprandial:   less than 180 mg/dL (1-2 hours)      Critically ill patients:  140 - 180 mg/dL   Results for [name] [name], [name] [name] (MRN [id number]) as of 10/16/2014 09:46  Ref. Range 10/15/2014 09:39 10/15/2014 11:15 10/15/2014 13:19 10/15/2014 15:54 10/15/2014 20:06 10/16/2014 00:08 10/16/2014 04:13 10/16/2014 07:15  Glucose-Capillary Latest Ref Range: 70-99 mg/dL 163 (H) 176 (H) 213 (H) 302 (H) 263 (H) 192 (H) 192 (H) 177 (H)    Diabetes history: DM2 Outpatient Diabetes medications: Lantus 15 units QHS, Metformin 1000 mg BID Current orders for Inpatient glycemic control: Novolog 0-20 units TID with meals, Novolog 0-5 units HS, Novolog 6 units TID with meals for meal coverage  Inpatient Diabetes Program Recommendations Insulin - Basal: Please consider ordering Lantus 10 units QHS.  Thanks, Barnie Alderman, RN, MSN, CCRN, CDE Diabetes Coordinator Inpatient Diabetes Program [phone] (Team Pager from [place] to [place]) [phone] ([facility] office) [phone] [facility] office)

## 2014-10-16 NOTE — Op Note (Signed)
[name] [name]               ACCOUNT NO.:  [id number]  MEDICAL RECORD NO.:  [id number]  LOCATION:  6N03C                        FACILITY:  [facility]  PHYSICIAN:  [name], M.D. DATE OF BIRTH:  [birth year]  DATE OF PROCEDURE:  10/15/2014 DATE OF DISCHARGE:                              OPERATIVE REPORT   PREOPERATIVE DIAGNOSIS:  Recurrent incisional hernia.  POSTOPERATIVE DIAGNOSIS:  Recurrent incisional hernia.  PROCEDURE:  Laparoscopic incisional hernia repair with mesh.  SURGEON:  [name], M.D.  ANESTHESIA:  General and 0.5% Marcaine.  ESTIMATED BLOOD LOSS:  Minimal.  INDICATIONS:  This is a 59 year old female with morbid obesity and cirrhosis who has had a recurrent incisional hernia.  Her first recurrence was repaired in December with mesh elsewhere.  She has not recurred again and has had a CAT scan showing the large hernia containing small and large bowel with omentum.  Decision made to proceed with an attempted laparoscopic repair.  FINDINGS:  The patient was found to have a ventral incisional hernia that contained small bowel, colon and omentum.  I was able to repair this laparoscopically using a 25.4 cm x 33 cm piece of Bard Ventralight mesh.  There was greater than 8 cm of clearance of the fascia circumferentially with the mesh.  PROCEDURE IN DETAIL:  The patient was brought to the operating room, identified as [name] [name].  She was placed supine on the operating room table  and general anesthesia was induced.  Her abdomen was then prepped and draped in the usual sterile fashion.  I made a small incision in the patient's left upper quadrant and then used a 5-mm trocar and Optiview camera to slowly traverse all levels of the abdominal wall and gain entrance into the peritoneal cavity under direct vision.  The abdomen was then insufflated with carbon dioxide.  I evaluated the introduction site and saw no evidence of bowel injury. The hernia  defect was easy to recognize.  It was above the umbilicus on her very obese abdomen.  It contained omentum, small bowel, and colon. I placed two more 5 mm ports in the patient's left upper and mid quadrant.  I then dissected free all contents from the hernia sac with both the Harmonic Scalpel and scissors.  I was able to free all contents from the hernia sac including most of the omentum.  I had to leave some omentum behind that had so densely adhered into the sac.  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  I evaluated the bowel and saw no evidence of injury.  Next, a 33 cm x 25.4 cm oval piece of Bard Ventralight mesh was brought onto the field.  I placed 4 stay sutures into the mesh, and then rolled up and placed it through a lateral incision which I made by removing the 5-mm port site and changing it to a 10 mm port.  I then opened it in the abdominal cavity. I then made 4 separate stab incisions and used the suture passer to pull all the sutures up to the abdominal wall.  This was quite a large piece of mesh.  The fascial defect itself was  approximately 10 cm.  I was overlapping this by 8 cm on each side.  Once I placed all sutures up, was able to pull the mesh up against the peritoneum.  I tied the sutures in place.  I then tacked the mesh in circumferentially with multiple absorbable tacks.  Once this was completed, the mesh was quite adherent circumferentially and while the around the fascial defect on the abdominal wall.  At this point, I again evaluated the bowel and saw no evidence of bowel injury.  Hemostasis also appeared to be achieved.  All ports were removed under direct vision and the abdomen was deflated. All incisions were then anesthetized with Marcaine and closed with 4-0 Monocryl subcuticular sutures.  Skin glue was then applied.  The patient tolerated the procedure well.  All the counts were correct at the end of the procedure.  The  patient was then extubated in the operating room and taken in stable condition to recovery room.     [name], M.D.     DB/MEDQ  D:  10/15/2014  T:  10/16/2014  Job:  [id number]

## 2014-10-16 NOTE — Progress Notes (Signed)
1 Day Post-Op  Subjective: Moderately sore, having some difficulty getting up from her obesity  Objective: Vital signs in last 24 hours: Temp:  [97.4 F (36.3 C)-98.4 F (36.9 C)] 98.1 F (36.7 C) (04/28 0621) Pulse Rate:  [74-100] 100 (04/28 0621) Resp:  [11-20] 17 (04/28 0621) BP: (100-194)/(39-74) 106/74 mmHg (04/28 0621) SpO2:  [91 %-99 %] 97 % (04/28 0621) Weight:  [142.429 kg (314 lb)] 142.429 kg (314 lb) (04/27 0714) Last BM Date: 10/14/14  Intake/Output from previous day: 04/27 0701 - 04/28 0700 In: 2452 [P.O.:240; I.V.:2212] Out: -  Intake/Output this shift:    Looks good Abdomen soft, binder in place  Lab Results:  No results for input(s): WBC, HGB, HCT, PLT in the last 72 hours. BMET No results for input(s): NA, K, CL, CO2, GLUCOSE, BUN, CREATININE, CALCIUM in the last 72 hours. PT/INR No results for input(s): LABPROT, INR in the last 72 hours. ABG No results for input(s): PHART, HCO3 in the last 72 hours.  Invalid input(s): PCO2, PO2  Studies/Results: No results found.  Anti-infectives: Anti-infectives    Start     Dose/Rate Route Frequency Ordered Stop   10/15/14 0600  vancomycin (VANCOCIN) 1,500 mg in sodium chloride 0.9 % 500 mL IVPB     1,500 mg 250 mL/hr over 120 Minutes Intravenous On call to O.R. 10/14/14 1439 10/15/14 0800      Assessment/Plan: s/p Procedure(s): LAPAROSCOPIC INCISIONAL HERNIA REPAIR (N/A) INSERTION OF MESH (N/A)  Will check labs this morning with HR up and BP down.  She will need to be here until tomorrow for pain control, assist with ambulation     Arrie Zuercher A 10/16/2014

## 2014-10-17 DIAGNOSIS — K432 Incisional hernia without obstruction or gangrene: Secondary | ICD-10-CM | POA: Diagnosis not present

## 2014-10-17 LAB — GLUCOSE, CAPILLARY: GLUCOSE-CAPILLARY: 178 mg/dL — AB (ref 70–99)

## 2014-10-17 NOTE — Progress Notes (Signed)
Patient ID: [name] [name], female   DOB: June 30, 1955, 59 y.o.   MRN: [id number]  Comfortable  Abdomen soft  Plan:  Discharge home

## 2014-10-17 NOTE — Progress Notes (Signed)
Discharge home. Home discharge instruction given, no question verbalized. 

## 2014-10-17 NOTE — Discharge Summary (Signed)
Physician Discharge Summary  Patient ID: [name] [name] MRN: [id number] DOB/AGE: Feb 02, 1956 59 y.o.  Admit date: 10/15/2014 Discharge date: 10/17/2014  Admission Diagnoses:  Discharge Diagnoses:  Active Problems:   Incisional hernia morbid obesity  Discharged Condition: good  Hospital Course: uneventful post op recovery s/p surgery.  Discharged POD#2  Consults: None  Significant Diagnostic Studies:   Treatments: surgery: laparoscopic incisional hernia repair with mesh  Discharge Exam: Blood pressure 178/86, pulse 92, temperature 98.2 F (36.8 C), temperature source Oral, resp. rate 16, height 5' 5"  (1.651 m), weight 142.429 kg (314 lb), SpO2 94 %. General appearance: alert, cooperative and no distress Resp: clear to auscultation bilaterally Cardio: regular rate and rhythm, S1, S2 normal, no murmur, click, rub or gallop Incision/Wound:abdomen obese, soft, minimally tender  Disposition: 01-Home or Self Care     Medication List    TAKE these medications        acetaminophen 500 MG tablet  Commonly known as:  TYLENOL  Take 1,000 mg by mouth every 6 (six) hours as needed (pain).     amLODipine-valsartan 10-320 MG per tablet  Commonly known as:  EXFORGE  Take 1 tablet by mouth daily.     aspirin 81 MG EC tablet  Take 81 mg by mouth daily.     buPROPion 300 MG 24 hr tablet  Commonly known as:  WELLBUTRIN XL  Take 300 mg by mouth daily.     CARTIA XT 240 MG 24 hr capsule  Generic drug:  diltiazem  TAKE ONE (1) CAPSULE EACH DAY     colesevelam 625 MG tablet  Commonly known as:  WELCHOL  Take 1,875 mg by mouth 2 (two) times daily with a meal. 3 tabs am 3 tabs pm     CYTOMEL 5 MCG tablet  Generic drug:  liothyronine  Take 5 mcg by mouth daily.     doxycycline 100 MG tablet  Commonly known as:  ADOXA  Take 100 mg by mouth 2 (two) times daily.     DULoxetine 60 MG capsule  Commonly known as:  CYMBALTA  Take 120 mg by mouth daily.     furosemide 20 MG  tablet  Commonly known as:  LASIX  Take 20 mg by mouth daily.     HYDROmorphone 4 MG tablet  Commonly known as:  DILAUDID  Take 4 mg by mouth every 6 (six) hours as needed for severe pain.     LANTUS SOLOSTAR 100 UNIT/ML injection  Generic drug:  insulin glargine  Inject 15 Units into the skin at bedtime.     levothyroxine 200 MCG tablet  Commonly known as:  SYNTHROID, LEVOTHROID  Take 200 mcg by mouth daily before breakfast.     levothyroxine 75 MCG tablet  Commonly known as:  SYNTHROID, LEVOTHROID  Take 75 mcg by mouth daily before breakfast.     metFORMIN 1000 MG tablet  Commonly known as:  GLUCOPHAGE  Take 1,000 mg by mouth 2 (two) times daily with a meal.     naproxen 500 MG tablet  Commonly known as:  NAPROSYN  Take 500 mg by mouth 2 (two) times daily with a meal.     nystatin 100000 UNIT/GM Powd  Apply 100,000 g topically daily. Rash     pantoprazole 40 MG tablet  Commonly known as:  PROTONIX  Take 1 tablet (40 mg total) by mouth daily before breakfast.     PROVENTIL HFA 108 (90 BASE) MCG/ACT inhaler  Generic drug:  albuterol  Inhale  2 puffs into the lungs every 6 (six) hours as needed for wheezing or shortness of breath. Shortness of breath     VOLTAREN 1 % Gel  Generic drug:  diclofenac sodium  Apply 4 g topically daily as needed (pain). For pain           Follow-up Information    Follow up with [facility], MD. Schedule an appointment as soon as possible for a visit in 3 weeks.   Specialty:  General Surgery   Contact information:   [address] [place] [address] [phone]       Signed: Harl Bowie 10/17/2014, 6:55 AM

## 2014-10-17 NOTE — Discharge Instructions (Signed)
[facility] ______[facility] LAPAROSCOPIC SURGERY: POST OP INSTRUCTIONS Always review your discharge instruction sheet given to you by the facility where your surgery was performed. IF YOU HAVE DISABILITY OR FAMILY LEAVE FORMS, YOU MUST BRING THEM TO THE OFFICE FOR PROCESSING.   DO NOT GIVE THEM TO YOUR DOCTOR.  1. A prescription for pain medication may be given to you upon discharge.  Take your pain medication as prescribed, if needed.  If narcotic pain medicine is not needed, then you may take acetaminophen (Tylenol) or ibuprofen (Advil) as needed. 2. Take your usually prescribed medications unless otherwise directed. 3. If you need a refill on your pain medication, please contact your pharmacy.  They will contact our office to request authorization. Prescriptions will not be filled after 5pm or on week-ends. 4. You should follow a light diet the first few days after arrival home, such as soup and crackers, etc.  Be sure to include lots of fluids daily. 5. Most patients will experience some swelling and bruising in the area of the incisions.  Ice packs will help.  Swelling and bruising can take several days to resolve.  6. It is common to experience some constipation if taking pain medication after surgery.  Increasing fluid intake and taking a stool softener (such as Colace) will usually help or prevent this problem from occurring.  A mild laxative (Milk of Magnesia or Miralax) should be taken according to package instructions if there are no bowel movements after 48 hours. 7. Unless discharge instructions indicate otherwise, you may remove your bandages 24-48 hours after surgery, and you may shower at that time.  You may have steri-strips (small skin tapes) in place directly over the incision.  These strips should be left on the skin for 7-10 days.  If your surgeon used skin glue on the incision, you may shower in 24 hours.  The glue will flake off over the next 2-3 weeks.  Any sutures or  staples will be removed at the office during your follow-up visit. 8. ACTIVITIES:  You may resume regular (light) daily activities beginning the next day--such as daily self-care, walking, climbing stairs--gradually increasing activities as tolerated.  You may have sexual intercourse when it is comfortable.  Refrain from any heavy lifting or straining until approved by your doctor. a. You may drive when you are no longer taking prescription pain medication, you can comfortably wear a seatbelt, and you can safely maneuver your car and apply brakes. b. RETURN TO WORK:  __________________________________________________________ 9. You should see your doctor in the office for a follow-up appointment approximately 2-3 weeks after your surgery.  Make sure that you call for this appointment within a day or two after you arrive home to insure a convenient appointment time. 10. OTHER INSTRUCTIONS:NO LIFTING MORE THAN 15 POUNDS FOR 4 WEEKS 11. OK TO SHOWER TODAY __________________________________________________________________________________________________________________________ __________________________________________________________________________________________________________________________ WHEN TO CALL YOUR DOCTOR: 1. Fever over 101.0 2. Inability to urinate 3. Continued bleeding from incision. 4. Increased pain, redness, or drainage from the incision. 5. Increasing abdominal pain  The clinic staff is available to answer your questions during regular business hours.  Please dont hesitate to call and ask to speak to one of the nurses for clinical concerns.  If you have a medical emergency, go to the nearest emergency room or call 911.  A surgeon from [facility] is always on call at the hospital. [address] [place], [place], [place]  [address] ? [address] [address], [place], [place]  [address] [phone] ? [phone] ? FAX [phone] Web site:  [email or web address]

## 2014-11-28 ENCOUNTER — Encounter: Payer: Self-pay | Admitting: Cardiology

## 2014-11-28 ENCOUNTER — Ambulatory Visit (INDEPENDENT_AMBULATORY_CARE_PROVIDER_SITE_OTHER): Payer: Medicare Other | Admitting: Cardiology

## 2014-11-28 VITALS — BP 142/68 | HR 94 | Ht 65.0 in | Wt 312.0 lb

## 2014-11-28 DIAGNOSIS — I10 Essential (primary) hypertension: Secondary | ICD-10-CM

## 2014-11-28 DIAGNOSIS — Z87898 Personal history of other specified conditions: Secondary | ICD-10-CM

## 2014-11-28 NOTE — Progress Notes (Signed)
Cardiology Office Note  Date: 11/28/2014   ID: Tenisha, Fleece 1956-03-01, MRN [id number]  PCP: Glo Herring., MD  Primary Cardiologist: Rozann Lesches, MD   Chief Complaint  Patient presents with  . History of chest pain  . Hypertension    History of Present Illness: [name] [name] is a 59 y.o. female last seen in March 2015. She comes in today for a follow-up visit, reports no recurrent chest pain symptoms. I reviewed her medications, her blood pressure actually looks better controlled today than in the past.  Interval records reviewed, she underwent laparoscopic incisional hernia repair in April. She states that she is just now getting back to typical activities. Still avoiding any heavy lifting. ECG from February is noted below.  She continues to follow with Dr. Gerarda Fraction.   Past Medical History  Diagnosis Date  . Type 2 diabetes mellitus   . Essential hypertension, benign   . GERD (gastroesophageal reflux disease)   . Edema   . Morbid obesity   . COPD (chronic obstructive pulmonary disease)   . Asthma   . Arthritis   . Depression   . Cirrhosis of liver   . Hypothyroidism   . Thyroid cancer   . Anxiety   . Sleep apnea     CPAP - not consistent    Current Outpatient Prescriptions  Medication Sig Dispense Refill  . acetaminophen (TYLENOL) 500 MG tablet Take 1,000 mg by mouth every 6 (six) hours as needed (pain).    [name] albuterol (PROVENTIL HFA) 108 (90 BASE) MCG/ACT inhaler Inhale 2 puffs into the lungs every 6 (six) hours as needed for wheezing or shortness of breath. Shortness of breath    . amLODipine-valsartan (EXFORGE) 10-320 MG per tablet Take 1 tablet by mouth daily.   0  . aspirin 81 MG EC tablet Take 81 mg by mouth daily.     [name] buPROPion (WELLBUTRIN XL) 300 MG 24 hr tablet Take 300 mg by mouth daily.   0  . CARTIA XT 240 MG 24 hr capsule TAKE ONE (1) CAPSULE EACH DAY 30 capsule 6  . colesevelam (WELCHOL) 625 MG tablet Take 1,875 mg by mouth 2 (two)  times daily with a meal. 3 tabs am 3 tabs pm    . Cyanocobalamin (B-12 COMPLIANCE INJECTION) 1000 MCG/ML KIT Inject as directed.    . doxycycline (ADOXA) 100 MG tablet Take 100 mg by mouth 2 (two) times daily.    . furosemide (LASIX) 20 MG tablet Take 20 mg by mouth daily.    [name] HYDROmorphone (DILAUDID) 4 MG tablet Take 4 mg by mouth every 6 (six) hours as needed for severe pain.   0  . insulin glargine (LANTUS SOLOSTAR) 100 UNIT/ML injection Inject 15 Units into the skin at bedtime.     [name] levothyroxine (SYNTHROID, LEVOTHROID) 200 MCG tablet Take 200 mcg by mouth daily before breakfast.    . levothyroxine (SYNTHROID, LEVOTHROID) 75 MCG tablet Take 75 mcg by mouth daily before breakfast.    . liothyronine (CYTOMEL) 5 MCG tablet Take 5 mcg by mouth daily.    . metFORMIN (GLUCOPHAGE) 1000 MG tablet Take 1,000 mg by mouth 2 (two) times daily with a meal.    . naproxen (NAPROSYN) 500 MG tablet Take 500 mg by mouth 2 (two) times daily with a meal.    . nystatin (NYSTOP) 100000 UNIT/GM POWD Apply 100,000 g topically daily. Rash    . pantoprazole (PROTONIX) 40 MG tablet Take 1 tablet (40  mg total) by mouth daily before breakfast. 30 tablet 5  . VOLTAREN 1 % GEL Apply 4 g topically daily as needed (pain). For pain     No current facility-administered medications for this visit.    Allergies:  Doxycycline; Adhesive; Biaxin; Clarithromycin; Clindamycin/lincomycin; Neosporin; and Penicillins   Social History: The patient  reports that she has never smoked. She has never used smokeless tobacco. She reports that she does not drink alcohol or use illicit drugs.   ROS:  Please see the history of present illness. Otherwise, complete review of systems is positive for back pain and arthritis.  All other systems are reviewed and negative.   Physical Exam: VS:  BP 142/68 mmHg  Pulse 94  Ht _0  (1.651 m)  Wt 312 lb (141.522 kg)  BMI 51.92 kg/m2  SpO2 99%, BMI Body mass index is 51.92 kg/(m^2).  Wt  Readings from Last 3 Encounters:  11/28/14 312 lb (141.522 kg)  10/15/14 314 lb (142.429 kg)  10/07/14 314 lb (142.429 kg)     Morbidly obese woman in no acute distress.  HEENT: Conjunctiva and lids normal, oropharynx clear.  Neck: No obvious elevated JVP or bruits, no thyromegaly.  Lungs: Clear auscultation, nonlabored, no wheezing.  Cardiac: Distant regular heart sounds, no significant murmur or gallop.  Abdomen: Morbidly obese with pannus, bowel sounds present, no tenderness or guarding.  Skin: Warm and dry. Some excoriations on her legs. Musculoskeletal: No kyphosis.  Extremities: No pitting edema, distal pulses one plus.  Neuropsychiatric: Alert and oriented x3, affect appropriate.   ECG: Tracing from 07/22/2014 showed normal sinus rhythm.  Recent Labwork: 07/21/2014: ALT 27; AST 28 10/16/2014: BUN 9; Creatinine, Ser 0.95; Hemoglobin 10.2*; Platelets 204; Potassium 4.5; Sodium 132*   Other Studies Reviewed Today:  Lexiscan Myoview from April 2012 suggested probable variable breast attenuation, less likely ischemia anteriorly, LVEF 67%. She did ultimately undergo a cardiac catheterization to clarify this issue in May 2012, and had no evidence of significant obstructive CAD with only mild coronary calcification noted.   ASSESSMENT AND PLAN:  1. History of chest pain, no significant recurrences. ECG from February was normal. Would continue observation.  2. Essential hypertension, continue current antihypertensives regimen and keep follow-up with Dr. Gerarda Fraction.  Current medicines were reviewed at length with the patient today.   Disposition: FU with me in 1 year.   Signed, Satira Sark, MD, [facility] 11/28/2014 11:03 AM    [facility]. [address] [place], [place] [address] Phone: [phone]; Fax: [phone]

## 2014-11-28 NOTE — Patient Instructions (Signed)
Your physician wants you to follow-up in: 1 year with Dr Ferne Reus will receive a reminder letter in the mail two months in advance. If you don't receive a letter, please call our office to schedule the follow-up appointment.   Your physician recommends that you continue on your current medications as directed. Please refer to the Current Medication list given to you today.     Thank you for choosing [facility] !

## 2015-01-08 ENCOUNTER — Encounter (INDEPENDENT_AMBULATORY_CARE_PROVIDER_SITE_OTHER): Payer: Self-pay | Admitting: *Deleted

## 2015-02-10 ENCOUNTER — Encounter (INDEPENDENT_AMBULATORY_CARE_PROVIDER_SITE_OTHER): Payer: Self-pay | Admitting: *Deleted

## 2015-02-10 ENCOUNTER — Other Ambulatory Visit (INDEPENDENT_AMBULATORY_CARE_PROVIDER_SITE_OTHER): Payer: Self-pay | Admitting: *Deleted

## 2015-02-10 DIAGNOSIS — K7460 Unspecified cirrhosis of liver: Secondary | ICD-10-CM

## 2015-03-10 ENCOUNTER — Ambulatory Visit (INDEPENDENT_AMBULATORY_CARE_PROVIDER_SITE_OTHER): Payer: Medicare Other | Admitting: Internal Medicine

## 2015-03-16 ENCOUNTER — Ambulatory Visit (INDEPENDENT_AMBULATORY_CARE_PROVIDER_SITE_OTHER): Payer: Medicare Other | Admitting: Internal Medicine

## 2015-03-20 ENCOUNTER — Other Ambulatory Visit (INDEPENDENT_AMBULATORY_CARE_PROVIDER_SITE_OTHER): Payer: Self-pay | Admitting: Internal Medicine

## 2015-03-24 LAB — AFP TUMOR MARKER: AFP TUMOR MARKER: 3.5 ng/mL (ref <6.1)

## 2015-03-26 ENCOUNTER — Encounter (INDEPENDENT_AMBULATORY_CARE_PROVIDER_SITE_OTHER): Payer: Self-pay | Admitting: Internal Medicine

## 2015-03-26 ENCOUNTER — Other Ambulatory Visit (INDEPENDENT_AMBULATORY_CARE_PROVIDER_SITE_OTHER): Payer: Self-pay | Admitting: *Deleted

## 2015-03-26 ENCOUNTER — Ambulatory Visit (INDEPENDENT_AMBULATORY_CARE_PROVIDER_SITE_OTHER): Payer: Medicare Other | Admitting: Internal Medicine

## 2015-03-26 ENCOUNTER — Encounter (INDEPENDENT_AMBULATORY_CARE_PROVIDER_SITE_OTHER): Payer: Self-pay | Admitting: *Deleted

## 2015-03-26 VITALS — BP 142/72 | HR 78 | Temp 98.1°F | Resp 18 | Ht 65.0 in | Wt 299.6 lb

## 2015-03-26 DIAGNOSIS — R1319 Other dysphagia: Secondary | ICD-10-CM

## 2015-03-26 DIAGNOSIS — R1310 Dysphagia, unspecified: Secondary | ICD-10-CM

## 2015-03-26 DIAGNOSIS — R1314 Dysphagia, pharyngoesophageal phase: Secondary | ICD-10-CM | POA: Diagnosis not present

## 2015-03-26 DIAGNOSIS — Z86010 Personal history of colonic polyps: Secondary | ICD-10-CM

## 2015-03-26 DIAGNOSIS — K7460 Unspecified cirrhosis of liver: Secondary | ICD-10-CM | POA: Diagnosis not present

## 2015-03-26 NOTE — Patient Instructions (Signed)
Esophagogastroduodenoscopy with esophageal dilation and colonoscopy to be scheduled.

## 2015-03-26 NOTE — Progress Notes (Signed)
Presenting complaint;  Follow-up for cirrhosis. Patient complains of dysphagia.  Subjective:  [name] [name] is 59 year old Caucasian female with cirrhosis secondary to NAFLD who is for scheduled visit. She was last seen on 09/01/2014. She was not able to schedule her EGD and/or colonoscopy. She underwent ventral hernia repair on 10/15/2014. He has lost 13 pounds since her last visit. She is watching her intake. She tries to walk as much as she can. She says her hemoglobin A1c was down to 6.0. She continues to complain of dysphagia to solids. She points to upper and lower sternal areas site of bolus obstruction. She rarely has heartburn while on pantoprazole. She denies melena or rectal bleeding. Bowels move every other day. She completed hepatitis and B vaccination 7 years ago when she was diagnosed with stage III cirrhosis.   Current Medications: Outpatient Encounter Prescriptions as of 03/26/2015  Medication Sig  . acetaminophen (TYLENOL) 500 MG tablet Take 1,000 mg by mouth every 6 (six) hours as needed (pain).  [name] albuterol (PROVENTIL HFA) 108 (90 BASE) MCG/ACT inhaler Inhale 2 puffs into the lungs every 6 (six) hours as needed for wheezing or shortness of breath. Shortness of breath  . ALPRAZolam (XANAX) 0.5 MG tablet Take 0.5 mg by mouth as needed for anxiety.  [name] amLODipine-valsartan (EXFORGE) 10-320 MG per tablet Take 1 tablet by mouth daily.   [name] aspirin 81 MG EC tablet Take 81 mg by mouth daily.   [name] buPROPion (WELLBUTRIN XL) 300 MG 24 hr tablet Take 300 mg by mouth daily.   [name] CARTIA XT 240 MG 24 hr capsule TAKE ONE (1) CAPSULE EACH DAY  . colesevelam (WELCHOL) 625 MG tablet Take 1,875 mg by mouth 2 (two) times daily with a meal. 3 tabs am 3 tabs pm  . Cyanocobalamin (B-12 COMPLIANCE INJECTION) 1000 MCG/ML KIT Inject as directed every 30 (thirty) days.   . DULoxetine (CYMBALTA) 60 MG capsule Take 120 mg by mouth daily.   . furosemide (LASIX) 20 MG tablet Take 20 mg by mouth daily.  [name]  HYDROmorphone (DILAUDID) 4 MG tablet Take 4 mg by mouth every 6 (six) hours as needed for severe pain.   [name] insulin glargine (LANTUS SOLOSTAR) 100 UNIT/ML injection Inject 15 Units into the skin at bedtime.   [name] liothyronine (CYTOMEL) 5 MCG tablet Take 5 mcg by mouth daily.  . metFORMIN (GLUCOPHAGE) 1000 MG tablet Take 1,000 mg by mouth 2 (two) times daily with a meal.  . nystatin (NYSTOP) 100000 UNIT/GM POWD Apply 100,000 g topically daily. Rash  . pantoprazole (PROTONIX) 40 MG tablet TAKE ONE (1) TABLET BY MOUTH EVERY DAY BEFORE BREAKFAST  . SYNTHROID 300 MCG tablet Take 300 mcg by mouth daily before breakfast.   . TOVIAZ 4 MG TB24 tablet Take 4 mg by mouth at bedtime.   . VOLTAREN 1 % GEL Apply 4 g topically daily as needed (pain). For pain  . [DISCONTINUED] doxycycline (ADOXA) 100 MG tablet Take 100 mg by mouth 2 (two) times daily.  . [DISCONTINUED] levothyroxine (SYNTHROID, LEVOTHROID) 200 MCG tablet Take 300 mcg by mouth daily before breakfast.   . [DISCONTINUED] levothyroxine (SYNTHROID, LEVOTHROID) 75 MCG tablet Take 300 mcg by mouth daily before breakfast.   . [DISCONTINUED] naproxen (NAPROSYN) 500 MG tablet Take 500 mg by mouth 2 (two) times daily with a meal.   No facility-administered encounter medications on file as of 03/26/2015.     Objective: Blood pressure 142/72, pulse 78, temperature 98.1 F (36.7 C), temperature source Oral, resp. rate  18, height 5' 5"  (1.651 m), weight 299 lb 9.6 oz (135.898 kg). Patient is alert and in no acute distress. She does not have asterixis. Conjunctiva is pink. Sclera is nonicteric Oropharyngeal mucosa is normal. No neck masses or thyromegaly noted. Cardiac exam with regular rhythm normal S1 and S2. No murmur or gallop noted. Lungs are clear to auscultation. Abdomen abdomen is obese with few scratch marks. She has central bulge when she coughs but does not appear to be hernia. Abdomen is soft but impossible to feel liver or spleen.  No LE  edema or clubbing noted. She has multiple scratch marks over her legs.  Labs/studies Results: AFP 3.5 on 03/23/2015 Other lab studies requested from Dr. Nolon Rod office.   Assessment:  #1. Cirrhosis secondary to NAFLD. She had liver biopsy in December 2007 revealing advanced fibrosis consistent with early active cirrhosis. She continues to lose weight which is very reassuring that she has a long way to go. She remains with well preserved hepatic function. She needs to improve her prognosis by continued attention to her diet and increasing physical activity. She needs to be assessed for esophageal varices and if she has varices she will benefit from primary prophylaxis. She is up-to-date regarding hepatitis A and B vaccination. #2. Solid food dysphagia. She may have esophageal ring or stricture. #3. GERD. Heartburn is well controlled with therapy. #4. She is average risk for CRC. Last colonoscopy was in November 2004 with removal of 3 small polyps and these were inflammatory polyps. She is due for CRC screening.  Plan:  Esophagogastroduodenoscopy with esophageal dilation if indicated Average risk screening colonoscopy under monitored anesthesia care. Patient encouraged to keep moving and try to lose another 12 pounds in the next 6 months. Office visit in 6 months.

## 2015-04-02 ENCOUNTER — Encounter (INDEPENDENT_AMBULATORY_CARE_PROVIDER_SITE_OTHER): Payer: Self-pay

## 2015-04-10 NOTE — Patient Instructions (Signed)
Alayia Meggison Heckel  04/10/2015     @PREFPERIOPPHARMACY @   Your procedure is scheduled on  04/17/2015   Report to [facility] at  615  A.M.  Call this number if you have problems the morning of surgery:  [phone]   Remember:  Do not eat food or drink liquids after midnight.  Take these medicines the morning of surgery with A SIP OF WATER  Xanax, exforge, wellbutrin, cartia, cymbalta, diladid, cytomel, protonix, synthroid. Take your inhaler before you come. Take 1/2 of your Lantus dosage the night before your surgery. DO NOT take any medicine for your diabetes the morning of your surgery.    Do not wear jewelry, make-up or nail polish.  Do not wear lotions, powders, or perfumes.  You may wear deodorant.  Do not shave 48 hours prior to surgery.  Men may shave face and neck.  Do not bring valuables to the hospital.  [facility] is not responsible for any belongings or valuables.  Contacts, dentures or bridgework may not be worn into surgery.  Leave your suitcase in the car.  After surgery it may be brought to your room.  For patients admitted to the hospital, discharge time will be determined by your treatment team.  Patients discharged the day of surgery will not be allowed to drive home.   Name and phone number of your driver:   family Special instructions:  Follow the diet and prep instructions given to you by Dr Olevia Perches office.  Please read over the following fact sheets that you were given. Pain Booklet, Coughing and Deep Breathing, Total Joint Packet, MRSA Information and Surgical Site Infection Prevention      Esophagogastroduodenoscopy Esophagogastroduodenoscopy (EGD) is a procedure that is used to examine the lining of the esophagus, stomach, and first part of the small intestine (duodenum). A long, flexible, lighted tube with a camera attached (endoscope) is inserted down the throat to view these organs. This procedure is done to detect problems or  abnormalities, such as inflammation, bleeding, ulcers, or growths, in order to treat them. The procedure lasts 5-20 minutes. It is usually an outpatient procedure, but it may need to be performed in a hospital in emergency cases. LET [facility] CARE PROVIDER KNOW ABOUT:  Any allergies you have.  All medicines you are taking, including vitamins, herbs, eye drops, creams, and over-the-counter medicines.  Previous problems you or members of your family have had with the use of anesthetics.  Any blood disorders you have.  Previous surgeries you have had.  Medical conditions you have. RISKS AND COMPLICATIONS Generally, this is a safe procedure. However, problems can occur and include:  Infection.  Bleeding.  Tearing (perforation) of the esophagus, stomach, or duodenum.  Difficulty breathing or not being able to breathe.  Excessive sweating.  Spasms of the larynx.  Slowed heartbeat.  Low blood pressure. BEFORE THE PROCEDURE  Do not eat or drink anything after midnight on the night before the procedure or as directed by your health care provider.  Do not take your regular medicines before the procedure if your health care provider asks you not to. Ask your health care provider about changing or stopping those medicines.  If you wear dentures, be prepared to remove them before the procedure.  Arrange for someone to drive you home after the procedure. PROCEDURE  A numbing medicine (local anesthetic) may be sprayed in your throat for comfort and to stop you from  gagging or coughing.  You will have an IV tube inserted in a vein in your hand or arm. You will receive medicines and fluids through this tube.  You will be given a medicine to relax you (sedative).  A pain reliever will be given through the IV tube.  A mouth guard may be placed in your mouth to protect your teeth and to keep you from biting on the endoscope.  You will be asked to lie on your left side.  The  endoscope will be inserted down your throat and into your esophagus, stomach, and duodenum.  Air will be put through the endoscope to allow your health care provider to clearly view the lining of your esophagus.  The lining of your esophagus, stomach, and duodenum will be examined. During the exam, your health care provider may:  Remove tissue to be examined under a microscope (biopsy) for inflammation, infection, or other medical problems.  Remove growths.  Remove objects (foreign bodies) that are stuck.  Treat any bleeding with medicines or other devices that stop tissues from bleeding (hot cautery, clipping devices).  Widen (dilate) or stretch narrowed areas of your esophagus and stomach.  The endoscope will be withdrawn. AFTER THE PROCEDURE  You will be taken to a recovery area for observation. Your blood pressure, heart rate, breathing rate, and blood oxygen level will be monitored often until the medicines you were given have worn off.  Do not eat or drink anything until the numbing medicine has worn off and your gag reflex has returned. You may choke.  Your health care provider should be able to discuss his or her findings with you. It will take longer to discuss the test results if any biopsies were taken.   This information is not intended to replace advice given to you by your health care provider. Make sure you discuss any questions you have with your health care provider.   Document Released: 10/07/2004 Document Revised: 06/27/2014 Document Reviewed: 05/09/2012 Elsevier Interactive Patient Education 2016 [employer] Esophageal Dilatation Esophageal dilatation is a procedure to open a blocked or narrowed part of the esophagus. The esophagus is the long tube in your throat that carries food and liquid from your mouth to your stomach. The procedure is also called esophageal dilation.  You may need this procedure if you have a buildup of scar tissue in your esophagus that  makes it difficult, painful, or even impossible to swallow. This can be caused by gastroesophageal reflux disease (GERD). In rare cases, people need this procedure because they have cancer of the esophagus or a problem with the way food moves through the esophagus. Sometimes you may need to have another dilatation to enlarge the opening of the esophagus gradually. LET [facility] CARE PROVIDER KNOW ABOUT:   Any allergies you have.  All medicines you are taking, including vitamins, herbs, eye drops, creams, and over-the-counter medicines.  Previous problems you or members of your family have had with the use of anesthetics.  Any blood disorders you have.  Previous surgeries you have had.  Medical conditions you have.  Any antibiotic medicines you are required to take before dental procedures. RISKS AND COMPLICATIONS Generally, this is a safe procedure. However, problems can occur and include:  Bleeding from a tear in the lining of the esophagus.  A hole (perforation) in the esophagus. BEFORE THE PROCEDURE  Do not eat or drink anything after midnight on the night before the procedure or as directed by your health care  provider.  Ask your health care provider about changing or stopping your regular medicines. This is especially important if you are taking diabetes medicines or blood thinners.  Plan to have someone take you home after the procedure. PROCEDURE   You will be given a medicine that makes you relaxed and sleepy (sedative).  A medicine may be sprayed or gargled to numb the back of the throat.  Your health care provider can use various instruments to do an esophageal dilatation. During the procedure, the instrument used will be placed in your mouth and passed down into your esophagus. Options include:  Simple dilators. This instrument is carefully placed in the esophagus to stretch it.  Guided wire bougies. In this method, a flexible tube (endoscope) is used to insert a  wire into the esophagus. The dilator is passed over this wire to enlarge the esophagus. Then the wire is removed.  Balloon dilators. An endoscope with a small balloon at the end is passed down into the esophagus. Inflating the balloon gently stretches the esophagus and opens it up. AFTER THE PROCEDURE  Your blood pressure, heart rate, breathing rate, and blood oxygen level will be monitored often until the medicines you were given have worn off.  Your throat may feel slightly sore and will probably still feel numb. This will improve slowly over time.  You will not be allowed to eat or drink until the throat numbness has resolved.  If this is a same-day procedure, you may be allowed to go home once you have been able to drink, urinate, and sit on the edge of the bed without nausea or dizziness.  If this is a same-day procedure, you should have a friend or family member with you for the next 24 hours after the procedure.   This information is not intended to replace advice given to you by your health care provider. Make sure you discuss any questions you have with your health care provider.   Document Released: 07/28/2005 Document Revised: 06/27/2014 Document Reviewed: 10/16/2013 Elsevier Interactive Patient Education 2016 [employer]. Colonoscopy A colonoscopy is an exam to look at the entire large intestine (colon). This exam can help find problems such as tumors, polyps, inflammation, and areas of bleeding. The exam takes about 1 hour.  LET [facility] CARE PROVIDER KNOW ABOUT:   Any allergies you have.  All medicines you are taking, including vitamins, herbs, eye drops, creams, and over-the-counter medicines.  Previous problems you or members of your family have had with the use of anesthetics.  Any blood disorders you have.  Previous surgeries you have had.  Medical conditions you have. RISKS AND COMPLICATIONS  Generally, this is a safe procedure. However, as with any  procedure, complications can occur. Possible complications include:  Bleeding.  Tearing or rupture of the colon wall.  Reaction to medicines given during the exam.  Infection (rare). BEFORE THE PROCEDURE   Ask your health care provider about changing or stopping your regular medicines.  You may be prescribed an oral bowel prep. This involves drinking a large amount of medicated liquid, starting the day before your procedure. The liquid will cause you to have multiple loose stools until your stool is almost clear or light green. This cleans out your colon in preparation for the procedure.  Do not eat or drink anything else once you have started the bowel prep, unless your health care provider tells you it is safe to do so.  Arrange for someone to drive you home  after the procedure. PROCEDURE   You will be given medicine to help you relax (sedative).  You will lie on your side with your knees bent.  A long, flexible tube with a light and camera on the end (colonoscope) will be inserted through the rectum and into the colon. The camera sends video back to a computer screen as it moves through the colon. The colonoscope also releases carbon dioxide gas to inflate the colon. This helps your health care provider see the area better.  During the exam, your health care provider may take a small tissue sample (biopsy) to be examined under a microscope if any abnormalities are found.  The exam is finished when the entire colon has been viewed. AFTER THE PROCEDURE   Do not drive for 24 hours after the exam.  You may have a small amount of blood in your stool.  You may pass moderate amounts of gas and have mild abdominal cramping or bloating. This is caused by the gas used to inflate your colon during the exam.  Ask when your test results will be ready and how you will get your results. Make sure you get your test results.   This information is not intended to replace advice given to you  by your health care provider. Make sure you discuss any questions you have with your health care provider.   Document Released: 06/03/2000 Document Revised: 03/27/2013 Document Reviewed: 02/11/2013 Elsevier Interactive Patient Education 2016 Elsevier Inc. PATIENT INSTRUCTIONS POST-ANESTHESIA  IMMEDIATELY FOLLOWING SURGERY:  Do not drive or operate machinery for the first twenty four hours after surgery.  Do not make any important decisions for twenty four hours after surgery or while taking narcotic pain medications or sedatives.  If you develop intractable nausea and vomiting or a severe headache please notify your doctor immediately.  FOLLOW-UP:  Please make an appointment with your surgeon as instructed. You do not need to follow up with anesthesia unless specifically instructed to do so.  WOUND CARE INSTRUCTIONS (if applicable):  Keep a dry clean dressing on the anesthesia/puncture wound site if there is drainage.  Once the wound has quit draining you may leave it open to air.  Generally you should leave the bandage intact for twenty four hours unless there is drainage.  If the epidural site drains for more than 36-48 hours please call the anesthesia department.  QUESTIONS?:  Please feel free to call your physician or the hospital operator if you have any questions, and they will be happy to assist you.

## 2015-04-13 ENCOUNTER — Encounter (HOSPITAL_COMMUNITY): Payer: Self-pay

## 2015-04-13 ENCOUNTER — Encounter (HOSPITAL_COMMUNITY)
Admission: RE | Admit: 2015-04-13 | Discharge: 2015-04-13 | Disposition: A | Discharge status: Home / Self Care | Payer: Medicare Other | Source: Ambulatory Visit | Attending: Internal Medicine | Admitting: Internal Medicine

## 2015-04-13 VITALS — BP 172/81 | HR 100 | Temp 97.7°F | Resp 18 | Ht 65.0 in | Wt 299.0 lb

## 2015-04-13 DIAGNOSIS — R1310 Dysphagia, unspecified: Secondary | ICD-10-CM | POA: Diagnosis not present

## 2015-04-13 DIAGNOSIS — Z01818 Encounter for other preprocedural examination: Secondary | ICD-10-CM | POA: Insufficient documentation

## 2015-04-13 DIAGNOSIS — Z86010 Personal history of colonic polyps: Secondary | ICD-10-CM

## 2015-04-13 HISTORY — DX: Anemia, unspecified: D64.9

## 2015-04-13 LAB — CBC WITH DIFFERENTIAL/PLATELET
Basophils Absolute: 0 10*3/uL (ref 0.0–0.1)
Basophils Relative: 1 %
EOS ABS: 0.2 10*3/uL (ref 0.0–0.7)
Eosinophils Relative: 3 %
HCT: 33 % — ABNORMAL LOW (ref 36.0–46.0)
HEMOGLOBIN: 10.3 g/dL — AB (ref 12.0–15.0)
Lymphocytes Relative: 23 %
Lymphs Abs: 1.5 10*3/uL (ref 0.7–4.0)
MCH: 26.3 pg (ref 26.0–34.0)
MCHC: 31.2 g/dL (ref 30.0–36.0)
MCV: 84.4 fL (ref 78.0–100.0)
MONOS PCT: 6 %
Monocytes Absolute: 0.4 10*3/uL (ref 0.1–1.0)
NEUTROS ABS: 4.5 10*3/uL (ref 1.7–7.7)
Neutrophils Relative %: 67 %
Platelets: 164 10*3/uL (ref 150–400)
RBC: 3.91 MIL/uL (ref 3.87–5.11)
RDW: 14.9 % (ref 11.5–15.5)
WBC: 6.6 10*3/uL (ref 4.0–10.5)

## 2015-04-13 LAB — BASIC METABOLIC PANEL
Anion gap: 5 (ref 5–15)
BUN: 10 mg/dL (ref 6–20)
CHLORIDE: 105 mmol/L (ref 101–111)
CO2: 30 mmol/L (ref 22–32)
CREATININE: 0.61 mg/dL (ref 0.44–1.00)
Calcium: 9.2 mg/dL (ref 8.9–10.3)
GFR calc Af Amer: >60 mL/min (ref >60)
GFR calc non Af Amer: >60 mL/min (ref >60)
Glucose, Bld: 166 mg/dL — ABNORMAL HIGH (ref 65–99)
POTASSIUM: 4.3 mmol/L (ref 3.5–5.1)
Sodium: 140 mmol/L (ref 135–145)

## 2015-04-17 ENCOUNTER — Encounter (HOSPITAL_COMMUNITY)
Admission: RE | Disposition: A | Discharge status: Home / Self Care | Payer: Self-pay | Source: Ambulatory Visit | Attending: Internal Medicine

## 2015-04-17 ENCOUNTER — Ambulatory Visit (HOSPITAL_COMMUNITY): Payer: Medicare Other | Admitting: Anesthesiology

## 2015-04-17 ENCOUNTER — Ambulatory Visit (HOSPITAL_COMMUNITY)
Admission: RE | Admit: 2015-04-17 | Discharge: 2015-04-17 | Disposition: A | Discharge status: Home / Self Care | Payer: Medicare Other | Source: Ambulatory Visit | Attending: Internal Medicine | Admitting: Internal Medicine

## 2015-04-17 ENCOUNTER — Encounter (HOSPITAL_COMMUNITY): Payer: Self-pay | Admitting: *Deleted

## 2015-04-17 DIAGNOSIS — D175 Benign lipomatous neoplasm of intra-abdominal organs: Secondary | ICD-10-CM | POA: Insufficient documentation

## 2015-04-17 DIAGNOSIS — F419 Anxiety disorder, unspecified: Secondary | ICD-10-CM | POA: Diagnosis not present

## 2015-04-17 DIAGNOSIS — E039 Hypothyroidism, unspecified: Secondary | ICD-10-CM | POA: Diagnosis not present

## 2015-04-17 DIAGNOSIS — D123 Benign neoplasm of transverse colon: Secondary | ICD-10-CM | POA: Diagnosis not present

## 2015-04-17 DIAGNOSIS — G4730 Sleep apnea, unspecified: Secondary | ICD-10-CM | POA: Insufficient documentation

## 2015-04-17 DIAGNOSIS — K2970 Gastritis, unspecified, without bleeding: Secondary | ICD-10-CM | POA: Diagnosis not present

## 2015-04-17 DIAGNOSIS — Z1211 Encounter for screening for malignant neoplasm of colon: Secondary | ICD-10-CM | POA: Diagnosis not present

## 2015-04-17 DIAGNOSIS — I38 Endocarditis, valve unspecified: Secondary | ICD-10-CM | POA: Diagnosis not present

## 2015-04-17 DIAGNOSIS — M1990 Unspecified osteoarthritis, unspecified site: Secondary | ICD-10-CM | POA: Insufficient documentation

## 2015-04-17 DIAGNOSIS — Z881 Allergy status to other antibiotic agents status: Secondary | ICD-10-CM | POA: Diagnosis not present

## 2015-04-17 DIAGNOSIS — K219 Gastro-esophageal reflux disease without esophagitis: Secondary | ICD-10-CM | POA: Diagnosis not present

## 2015-04-17 DIAGNOSIS — K3189 Other diseases of stomach and duodenum: Secondary | ICD-10-CM | POA: Diagnosis not present

## 2015-04-17 DIAGNOSIS — K635 Polyp of colon: Secondary | ICD-10-CM | POA: Diagnosis not present

## 2015-04-17 DIAGNOSIS — I10 Essential (primary) hypertension: Secondary | ICD-10-CM | POA: Insufficient documentation

## 2015-04-17 DIAGNOSIS — Z6841 Body Mass Index (BMI) 40.0 and over, adult: Secondary | ICD-10-CM | POA: Insufficient documentation

## 2015-04-17 DIAGNOSIS — F329 Major depressive disorder, single episode, unspecified: Secondary | ICD-10-CM | POA: Diagnosis not present

## 2015-04-17 DIAGNOSIS — Z880 Allergy status to penicillin: Secondary | ICD-10-CM | POA: Diagnosis not present

## 2015-04-17 DIAGNOSIS — K7460 Unspecified cirrhosis of liver: Secondary | ICD-10-CM | POA: Diagnosis not present

## 2015-04-17 DIAGNOSIS — Z79899 Other long term (current) drug therapy: Secondary | ICD-10-CM | POA: Insufficient documentation

## 2015-04-17 DIAGNOSIS — Z91048 Other nonmedicinal substance allergy status: Secondary | ICD-10-CM | POA: Diagnosis not present

## 2015-04-17 DIAGNOSIS — K648 Other hemorrhoids: Secondary | ICD-10-CM | POA: Insufficient documentation

## 2015-04-17 DIAGNOSIS — Z885 Allergy status to narcotic agent status: Secondary | ICD-10-CM | POA: Insufficient documentation

## 2015-04-17 DIAGNOSIS — R1310 Dysphagia, unspecified: Secondary | ICD-10-CM | POA: Diagnosis not present

## 2015-04-17 DIAGNOSIS — Z85850 Personal history of malignant neoplasm of thyroid: Secondary | ICD-10-CM | POA: Insufficient documentation

## 2015-04-17 DIAGNOSIS — E119 Type 2 diabetes mellitus without complications: Secondary | ICD-10-CM | POA: Diagnosis not present

## 2015-04-17 DIAGNOSIS — Z9049 Acquired absence of other specified parts of digestive tract: Secondary | ICD-10-CM | POA: Insufficient documentation

## 2015-04-17 DIAGNOSIS — J449 Chronic obstructive pulmonary disease, unspecified: Secondary | ICD-10-CM | POA: Insufficient documentation

## 2015-04-17 DIAGNOSIS — Z86010 Personal history of colonic polyps: Secondary | ICD-10-CM

## 2015-04-17 DIAGNOSIS — Z90710 Acquired absence of both cervix and uterus: Secondary | ICD-10-CM | POA: Diagnosis not present

## 2015-04-17 DIAGNOSIS — Z7982 Long term (current) use of aspirin: Secondary | ICD-10-CM | POA: Diagnosis not present

## 2015-04-17 DIAGNOSIS — Z794 Long term (current) use of insulin: Secondary | ICD-10-CM | POA: Diagnosis not present

## 2015-04-17 DIAGNOSIS — Z883 Allergy status to other anti-infective agents status: Secondary | ICD-10-CM | POA: Diagnosis not present

## 2015-04-17 HISTORY — PX: POLYPECTOMY: SHX5525

## 2015-04-17 HISTORY — PX: ESOPHAGOGASTRODUODENOSCOPY (EGD) WITH PROPOFOL: SHX5813

## 2015-04-17 HISTORY — PX: COLONOSCOPY WITH PROPOFOL: SHX5780

## 2015-04-17 HISTORY — PX: ESOPHAGEAL DILATION: SHX303

## 2015-04-17 LAB — GLUCOSE, CAPILLARY: GLUCOSE-CAPILLARY: 169 mg/dL — AB (ref 65–99)

## 2015-04-17 SURGERY — COLONOSCOPY WITH PROPOFOL
Anesthesia: Monitor Anesthesia Care

## 2015-04-17 MED ORDER — GLYCOPYRROLATE 0.2 MG/ML IJ SOLN
INTRAMUSCULAR | Status: AC
  Filled 2015-04-17: qty 1

## 2015-04-17 MED ORDER — PROPOFOL 500 MG/50ML IV EMUL
INTRAVENOUS | Status: DC | PRN
  Administered 2015-04-17: 08:00:00 via INTRAVENOUS
  Administered 2015-04-17: 125 ug/kg/min via INTRAVENOUS

## 2015-04-17 MED ORDER — ONDANSETRON HCL 4 MG/2ML IJ SOLN
4.0000 mg | Freq: Once | INTRAMUSCULAR | Status: AC
  Administered 2015-04-17: 4 mg via INTRAVENOUS

## 2015-04-17 MED ORDER — GLYCOPYRROLATE 0.2 MG/ML IJ SOLN
0.2000 mg | Freq: Once | INTRAMUSCULAR | Status: AC
  Administered 2015-04-17: 0.2 mg via INTRAVENOUS

## 2015-04-17 MED ORDER — BUTAMBEN-TETRACAINE-BENZOCAINE 2-2-14 % EX AERO
1.0000 | INHALATION_SPRAY | Freq: Once | CUTANEOUS | Status: AC
  Administered 2015-04-17: 1 via TOPICAL
  Filled 2015-04-17: qty 20

## 2015-04-17 MED ORDER — MIDAZOLAM HCL 2 MG/2ML IJ SOLN
1.0000 mg | INTRAMUSCULAR | Status: DC | PRN
  Administered 2015-04-17: 2 mg via INTRAVENOUS

## 2015-04-17 MED ORDER — STERILE WATER FOR IRRIGATION IR SOLN
Status: DC | PRN
  Administered 2015-04-17: 1000 mL

## 2015-04-17 MED ORDER — ONDANSETRON HCL 4 MG/2ML IJ SOLN: 4.0000 mg | Freq: Once | INTRAMUSCULAR | Status: DC | PRN

## 2015-04-17 MED ORDER — ONDANSETRON HCL 4 MG/2ML IJ SOLN
INTRAMUSCULAR | Status: AC
  Filled 2015-04-17: qty 2

## 2015-04-17 MED ORDER — PROPOFOL 10 MG/ML IV BOLUS
INTRAVENOUS | Status: AC
  Filled 2015-04-17: qty 20

## 2015-04-17 MED ORDER — LACTATED RINGERS IV SOLN
INTRAVENOUS | Status: DC
  Administered 2015-04-17: 07:00:00 via INTRAVENOUS

## 2015-04-17 MED ORDER — MIDAZOLAM HCL 2 MG/2ML IJ SOLN
INTRAMUSCULAR | Status: AC
  Filled 2015-04-17: qty 2

## 2015-04-17 MED ORDER — MIDAZOLAM HCL 5 MG/5ML IJ SOLN
INTRAMUSCULAR | Status: DC | PRN
  Administered 2015-04-17: 1 mg via INTRAVENOUS

## 2015-04-17 MED ORDER — MIDAZOLAM HCL 2 MG/2ML IJ SOLN
INTRAMUSCULAR | Status: AC
  Filled 2015-04-17: qty 4

## 2015-04-17 MED ORDER — FENTANYL CITRATE (PF) 100 MCG/2ML IJ SOLN: 25.0000 ug | INTRAMUSCULAR | Status: DC | PRN

## 2015-04-17 MED ORDER — FENTANYL CITRATE (PF) 100 MCG/2ML IJ SOLN
INTRAMUSCULAR | Status: AC
Start: 2015-04-17 — End: 2015-04-17
  Filled 2015-04-17: qty 2

## 2015-04-17 MED ORDER — FENTANYL CITRATE (PF) 100 MCG/2ML IJ SOLN
25.0000 ug | INTRAMUSCULAR | Status: AC
  Administered 2015-04-17 (×2): 25 ug via INTRAVENOUS

## 2015-04-17 SURGICAL SUPPLY — 12 items
BLOCK BITE 60FR ADLT L/F BLUE (MISCELLANEOUS) ×4 IMPLANT
ELECT REM PT RETURN 9FT ADLT (ELECTROSURGICAL) ×4
ELECTRODE REM PT RTRN 9FT ADLT (ELECTROSURGICAL) ×2 IMPLANT
FLOOR PAD 36X40 (MISCELLANEOUS) ×4
FORMALIN 10 PREFIL 20ML (MISCELLANEOUS) ×4 IMPLANT
KIT ENDO PROCEDURE PEN (KITS) ×8 IMPLANT
MANIFOLD NEPTUNE II (INSTRUMENTS) ×4 IMPLANT
PAD FLOOR 36X40 (MISCELLANEOUS) ×2 IMPLANT
SNARE ROTATE MED OVAL 20MM (MISCELLANEOUS) ×4 IMPLANT
SYR 50ML LL SCALE MARK (SYRINGE) ×4 IMPLANT
TUBING IRRIGATION ENDOGATOR (MISCELLANEOUS) ×4 IMPLANT
WATER STERILE IRR 1000ML POUR (IV SOLUTION) ×4 IMPLANT

## 2015-04-17 NOTE — Transfer of Care (Signed)
Immediate Anesthesia Transfer of Care Note  Patient: [name] [name]  Procedure(s) Performed: Procedure(s): COLONOSCOPY WITH PROPOFOL  at cecum at 0810; withdrawal time =15 minutes (N/A) ESOPHAGOGASTRODUODENOSCOPY (EGD) WITH PROPOFOL (N/A) ESOPHAGEAL DILATION WITH 56FR MALONEY DILATOR (N/A) POLYPECTOMY  Patient Location: PACU  Anesthesia Type:MAC  Level of Consciousness: awake, alert  and oriented  Airway & Oxygen Therapy: Patient Spontanous Breathing  Post-op Assessment: Report given to RN  Post vital signs: Reviewed and stable  Last Vitals:  Filed Vitals:   04/17/15 0735  BP: 155/75  Temp:   Resp: 19    Complications: No apparent anesthesia complications

## 2015-04-17 NOTE — Discharge Instructions (Signed)
No aspirin or NSAIDs for 1 week. Resume other medications and diet as before. No driving for 24 hours. Physician will call with biopsy results.    General Anesthesia, Adult, Care After Refer to this sheet in the next few weeks. These instructions provide you with information on caring for yourself after your procedure. Your health care provider may also give you more specific instructions. Your treatment has been planned according to current medical practices, but problems sometimes occur. Call your health care provider if you have any problems or questions after your procedure. WHAT TO EXPECT AFTER THE PROCEDURE After the procedure, it is typical to experience:  Sleepiness.  Nausea and vomiting. HOME CARE INSTRUCTIONS  For the first 24 hours after general anesthesia:  Have a responsible person with you.  Do not drive a car. If you are alone, do not take public transportation.  Do not drink alcohol.  Do not take medicine that has not been prescribed by your health care provider.  Do not sign important papers or make important decisions.  You may resume a normal diet and activities as directed by your health care provider.  Change bandages (dressings) as directed.  If you have questions or problems that seem related to general anesthesia, call the hospital and ask for the anesthetist or anesthesiologist on call. SEEK MEDICAL CARE IF:  You have nausea and vomiting that continue the day after anesthesia.  You develop a rash. SEEK IMMEDIATE MEDICAL CARE IF:   You have difficulty breathing.  You have chest pain.  You have any allergic problems.   This information is not intended to replace advice given to you by your health care provider. Make sure you discuss any questions you have with your health care provider.   Document Released: 09/12/2000 Document Revised: 06/27/2014 Document Reviewed: 10/05/2011 Elsevier Interactive Patient Education [employer].

## 2015-04-17 NOTE — Op Note (Addendum)
EGD ED AND COLONOSCOPY  PROCEDURE REPORT  PATIENT:  [name] [name]  MR#:  [id number] Birthdate:  April 16, 1956, 59 y.o., female Endoscopist:  Dr. [name], MD Referred By:  Dr. Glo Herring, MD Procedure Date: 04/17/2015  Procedure:   EGD ED & Colonoscopy with snare polypectomy  Indications:  Patient is 59 year old Caucasian female with multiple medical problems including chronic GERD and cirrhosis who presents with solid food dysphagia. She is undergoing EGD with esophageal dilation unless she has large esophageal varices followed by elevation risk screening colonoscopy.            Informed Consent:  The risks, benefits, alternatives & imponderables which include, but are not limited to, bleeding, infection, perforation, drug reaction and potential missed lesion have been reviewed.  The potential for biopsy, lesion removal, esophageal dilation, etc. have also been discussed.  Questions have been answered.  All parties agreeable.  Please see history & physical in medical record for more information.  Medications:  Cetacaine spray topically for oropharyngeal anesthesia Monitored anesthesia care.  EGD  Description of procedure:  The endoscope was introduced through the mouth and advanced to the second portion of the duodenum without difficulty or limitations. The mucosal surfaces were surveyed very carefully during advancement of the scope and upon withdrawal.  Findings:  Esophagus:  Mucosa of the esophagus was normal. No varices ring or stricture noted. GEJ:  39 cm Stomach:  Stomach was empty and distended very well with insufflation. Folds in the proximal stomach were normal. Examination of mucosa at gastric body revealed erythema with mosaic pattern. Multiple antral erosions noted. Pyloric channel was patent. Tenderness was unremarkable. No fundal varices present. Duodenum:  Normal bulbar and post bulbar mucosa.  Therapeutic/Diagnostic Maneuvers Performed:   Esophagus dilated by  passing 43 French Maloney dilator to full insertion. As the dilator was withdrawn endoscope was passed again and no disruption noted to esophageal mucosa.  COLONOSCOPY Description of procedure:  After a digital rectal exam was performed, that colonoscope was advanced from the anus through the rectum and colon to the area of the cecum, ileocecal valve and appendiceal orifice. The cecum was deeply intubated. These structures were well-seen and photographed for the record. From the level of the cecum and ileocecal valve, the scope was slowly and cautiously withdrawn. The mucosal surfaces were carefully surveyed utilizing scope tip to flexion to facilitate fold flattening as needed. The scope was pulled down into the rectum where a thorough exam including retroflexion was performed.  Findings:   Prep fair to satisfactory. 950 mL of liquid stool was suctioned out. 6 mm submucosal lipoma at hepatic flexure. 8 mm polyp hot snared from splenic flexure. Normal rectal mucosa. Small hemorrhoids above the dentate line.  Therapeutic/Diagnostic Maneuvers Performed:  See above  Complications:  None  EBL: None  Cecal Withdrawal Time:  15 minutes  Impression:  EGD findings: No evidence of esophagitis, esophageal stricture or esophageal varices. Portal gastropathy. No evidence of gastric varices. Erosive antral gastritis. Esophagus dilated by passing 56 French Maloney dilator but no mucosal disruption noted to esophagus.  Colonoscopy findings: Small lipoma at hepatic flexure. 8 mm polyp hot snared from splenic flexure. Small internal hemorrhoids.  Recommendations:  Standard instructions given. H.pylori serology. No aspirin or NSAIDs for 1 week. I will be contacting patient with results of biopsy and blood test.  REHMAN,NAJEEB U  04/17/2015 8:41 AM  CC: Dr. Glo Herring., MD & Dr. Rayne Du ref. provider found

## 2015-04-17 NOTE — H&P (Signed)
[name] [name] is an 59 y.o. female.   Chief Complaint: Patient is here for EGD possible EGD and colonoscopy. HPI: Patient is 59 year old Caucasian female with multiple medical problems including morbid obesity and cirrhosis who has chronic GERD. She presents with intermittent solid food dysphagia. She says heartburn is well controlled with PPI. She denies nausea vomiting melena or rectal bleeding. Last colonoscopy was about 10 years ago with removal of 3 small polyps and these were inflammatory. Lab studies pertinent for hemoglobin of 10.2 and platelet count is normal.  Past Medical History  Diagnosis Date  . Type 2 diabetes mellitus ([place])   . Essential hypertension, benign   . GERD (gastroesophageal reflux disease)   . Edema   . Morbid obesity ([place])   . COPD (chronic obstructive pulmonary disease) ([place])   . Asthma   . Arthritis   . Depression   . Cirrhosis of liver ([place])   . Hypothyroidism   . Thyroid cancer ([place])   . Anxiety   . Sleep apnea     CPAP - not consistent  . Anemia     Past Surgical History  Procedure Laterality Date  . Thyroidectomy  2009  . Cholecystectomy  1996  . Tonsillectomy  1958  . Foot surgery Bilateral 2000    Bone spur removed  . Abdominal hysterectomy  1985  . Exploratory laparotomy  2003  . Umbilical hernia repair  2010  . Debridement of abdominal wound  2011  . Cesarean section  1977  . Resection distal clavical  05/07/2012    Procedure: RESECTION DISTAL CLAVICAL;  Surgeon: [name], MD;  Location: [facility];  Service: Orthopedics;  Laterality: Right;  . Shoulder open rotator cuff repair  05/07/2012    Procedure: ROTATOR CUFF REPAIR SHOULDER OPEN;  Surgeon: [name], MD;  Location: [facility];  Service: Orthopedics;  Laterality: Right;  . Incisional hernia repair N/A 06/09/2014    Procedure: RECURRENT  INCISIONAL HERNIORRHAPHY WITH MESH;  Surgeon: [name], MD;  Location: [facility];  Service: General;  Laterality: N/A;  .  Insertion of mesh N/A 06/09/2014    Procedure: INSERTION OF MESH;  Surgeon: [name], MD;  Location: [facility];  Service: General;  Laterality: N/A;  . Cardiac catheterization  2012    Dr. Lia Foyer told her nothing was wrong.  . "pump bumps"      bilateral heels--2000  . Incisional hernia repair N/A 10/15/2014    Procedure: LAPAROSCOPIC INCISIONAL HERNIA REPAIR;  Surgeon: [name], MD;  Location: [facility];  Service: General;  Laterality: N/A;  . Insertion of mesh N/A 10/15/2014    Procedure: INSERTION OF MESH;  Surgeon: [name], MD;  Location: [facility];  Service: General;  Laterality: N/A;    Family History  Problem Relation Age of Onset  . Heart disease    . Arthritis    . Cancer    . Asthma    . Diabetes    . Kidney disease     Social History:  reports that she has never smoked. She has never used smokeless tobacco. She reports that she does not drink alcohol or use illicit drugs.  Allergies:  Allergies  Allergen Reactions  . Doxycycline Itching  . Adhesive [Tape] Other (See Comments)    Blisters skin  . Biaxin [Clarithromycin] Other (See Comments)    Really bad yeast infection  . Percocet [Oxycodone-Acetaminophen] Itching  . Clarithromycin Rash    Yeast Infection  . Clindamycin/Lincomycin Rash  Yeast Infection   . Neosporin [Neomycin-Polymyxin B Gu] Rash    Eyes Drops only  . Penicillins Rash    Medications Prior to Admission  Medication Sig Dispense Refill  . acetaminophen (TYLENOL) 500 MG tablet Take 1,000 mg by mouth every 6 (six) hours as needed (pain).    [name] albuterol (PROVENTIL HFA) 108 (90 BASE) MCG/ACT inhaler Inhale 2 puffs into the lungs every 6 (six) hours as needed for wheezing or shortness of breath. Shortness of breath    . ALPRAZolam (XANAX) 0.5 MG tablet Take 0.5 mg by mouth as needed for anxiety.    [name] amLODipine-valsartan (EXFORGE) 10-320 MG per tablet Take 1 tablet by mouth daily.   0  . aspirin 81 MG EC tablet Take 81 mg by mouth daily.      [name] buPROPion (WELLBUTRIN XL) 300 MG 24 hr tablet Take 300 mg by mouth daily.   0  . CARTIA XT 240 MG 24 hr capsule TAKE ONE (1) CAPSULE EACH DAY 30 capsule 6  . colesevelam (WELCHOL) 625 MG tablet Take 1,875 mg by mouth 2 (two) times daily with a meal. 3 tabs am 3 tabs pm    . Cyanocobalamin (B-12 COMPLIANCE INJECTION) 1000 MCG/ML KIT Inject as directed every 30 (thirty) days.     . DULoxetine (CYMBALTA) 60 MG capsule Take 120 mg by mouth daily.   4  . furosemide (LASIX) 20 MG tablet Take 20 mg by mouth daily.    [name] HYDROmorphone (DILAUDID) 4 MG tablet Take 4 mg by mouth every 6 (six) hours as needed for severe pain.   0  . insulin glargine (LANTUS SOLOSTAR) 100 UNIT/ML injection Inject 15 Units into the skin at bedtime.     [name] liothyronine (CYTOMEL) 5 MCG tablet Take 5 mcg by mouth daily.    . metFORMIN (GLUCOPHAGE) 1000 MG tablet Take 1,000 mg by mouth 2 (two) times daily with a meal.    . nystatin (NYSTOP) 100000 UNIT/GM POWD Apply 100,000 g topically daily. Rash    . pantoprazole (PROTONIX) 40 MG tablet TAKE ONE (1) TABLET BY MOUTH EVERY DAY BEFORE BREAKFAST 30 tablet 5  . SYNTHROID 300 MCG tablet Take 300 mcg by mouth daily before breakfast.   5  . TOVIAZ 4 MG TB24 tablet Take 4 mg by mouth at bedtime.   4  . VOLTAREN 1 % GEL Apply 4 g topically daily as needed (pain). For pain      Results for orders placed or performed during the hospital encounter of 04/17/15 (from the past 48 hour(s))  Glucose, capillary     Status: Abnormal   Collection Time: 04/17/15  6:50 AM  Result Value Ref Range   Glucose-Capillary 169 (H) 65 - 99 mg/dL   No results found.  ROS  Blood pressure 156/89, temperature 98 F (36.7 C), temperature source Oral, resp. rate 18, SpO2 97 %. Physical Exam  Constitutional:  Well-developed obese Caucasian female in NAD.  HENT:  Mouth/Throat: Oropharynx is clear and moist.  Eyes: Conjunctivae are normal. No scleral icterus.  Neck: No thyromegaly present.   Cardiovascular: Normal rate, regular rhythm and normal heart sounds.   No murmur heard. Respiratory: Effort normal and breath sounds normal.  GI:  Abdomen is obese with 2 superficial ulcers and mid abdomen and one towards right upper quadrant. She has tenderness in right upper quadrant. Difficult to palpate liver or spleen.  Musculoskeletal: She exhibits no edema.  She has multiple scratch marks over both lower extremities.  Lymphadenopathy:    She has no cervical adenopathy.  Neurological: She is alert.  Skin: Skin is warm and dry.     Assessment/Plan Solid food dysphagia in patient with chronic GERD. Cirrhosis secondary to nonalcoholic steatohepatitis. EGD with possible dilation unless she has developed varices. Average risk screening colonoscopy. Her seizures to be performed under monitored anesthesia care.  REHMAN,NAJEEB U 04/17/2015, 7:21 AM

## 2015-04-17 NOTE — OR Nursing (Signed)
Multiple  Scratches and scabs sores to all extremities.  She  Stated that she has anxiety and extremly dry itchy skin.

## 2015-04-17 NOTE — Anesthesia Postprocedure Evaluation (Signed)
  Anesthesia Post-op Note  Patient: [name] [name]  Procedure(s) Performed: Procedure(s): COLONOSCOPY WITH PROPOFOL  at cecum at 0810; withdrawal time =15 minutes (N/A) ESOPHAGOGASTRODUODENOSCOPY (EGD) WITH PROPOFOL (N/A) ESOPHAGEAL DILATION WITH 56FR MALONEY DILATOR (N/A) POLYPECTOMY  Patient Location: Short Stay  Anesthesia Type:MAC  Level of Consciousness: awake, alert  and patient cooperative  Airway and Oxygen Therapy: Patient Spontanous Breathing  Post-op Pain: mild  Post-op Assessment: Post-op Vital signs reviewed              Post-op Vital Signs: Reviewed and stable  Last Vitals:  Filed Vitals:   04/17/15 0915  BP: 145/66  Pulse: 86  Temp: 36.4 C  Resp: 18    Complications: No apparent anesthesia complications

## 2015-04-17 NOTE — Anesthesia Preprocedure Evaluation (Signed)
Anesthesia Evaluation  Patient identified by MRN, date of birth, ID band  Reviewed: Allergy & Precautions, H&P , NPO status , Patient's Chart, lab work & pertinent test results  Airway Mallampati: I  TM Distance: >3 FB     Dental  (+) Teeth Intact   Pulmonary asthma , sleep apnea , COPD,    breath sounds clear to auscultation       Cardiovascular hypertension, Pt. on medications  Rhythm:Regular Rate:Normal + Diastolic murmurs    Neuro/Psych PSYCHIATRIC DISORDERS Anxiety Depression    GI/Hepatic GERD  Medicated,  Endo/Other  diabetes, Well Controlled, Type 2, Insulin DependentHypothyroidism Morbid obesity  Renal/GU      Musculoskeletal  (+) Arthritis ,   Abdominal   Peds  Hematology  (+) anemia ,   Anesthesia Other Findings   Reproductive/Obstetrics                             Anesthesia Physical Anesthesia Plan  ASA: III  Anesthesia Plan: MAC   Post-op Pain Management:    Induction: Intravenous  Airway Management Planned: Simple Face Mask  Additional Equipment:   Intra-op Plan:   Post-operative Plan:   Informed Consent: I have reviewed the patients History and Physical, chart, labs and discussed the procedure including the risks, benefits and alternatives for the proposed anesthesia with the patient or authorized representative who has indicated his/her understanding and acceptance.     Plan Discussed with:   Anesthesia Plan Comments:         Anesthesia Quick Evaluation

## 2015-04-18 LAB — H. PYLORI ANTIBODY, IGG

## 2015-04-20 ENCOUNTER — Other Ambulatory Visit: Payer: Self-pay | Admitting: Cardiology

## 2015-04-20 ENCOUNTER — Encounter (HOSPITAL_COMMUNITY): Payer: Self-pay | Admitting: Internal Medicine

## 2015-04-24 ENCOUNTER — Ambulatory Visit (HOSPITAL_COMMUNITY)
Admission: RE | Admit: 2015-04-24 | Discharge: 2015-04-24 | Disposition: A | Discharge status: Home / Self Care | Payer: Medicare Other | Source: Ambulatory Visit | Attending: Family Medicine | Admitting: Family Medicine

## 2015-04-24 ENCOUNTER — Other Ambulatory Visit (HOSPITAL_COMMUNITY): Payer: Self-pay | Admitting: Family Medicine

## 2015-04-24 DIAGNOSIS — M1712 Unilateral primary osteoarthritis, left knee: Secondary | ICD-10-CM | POA: Insufficient documentation

## 2015-04-24 DIAGNOSIS — M25562 Pain in left knee: Secondary | ICD-10-CM | POA: Diagnosis not present

## 2015-04-24 DIAGNOSIS — R0781 Pleurodynia: Secondary | ICD-10-CM

## 2015-04-24 DIAGNOSIS — S2232XA Fracture of one rib, left side, initial encounter for closed fracture: Secondary | ICD-10-CM | POA: Diagnosis not present

## 2015-04-24 DIAGNOSIS — R079 Chest pain, unspecified: Secondary | ICD-10-CM | POA: Diagnosis present

## 2015-04-27 ENCOUNTER — Emergency Department (HOSPITAL_COMMUNITY)
Admission: EM | Admit: 2015-04-27 | Discharge: 2015-04-27 | Disposition: A | Discharge status: Home / Self Care | Payer: Medicare Other | Attending: Emergency Medicine | Admitting: Emergency Medicine

## 2015-04-27 ENCOUNTER — Emergency Department (HOSPITAL_COMMUNITY): Payer: Medicare Other

## 2015-04-27 ENCOUNTER — Encounter (HOSPITAL_COMMUNITY): Payer: Self-pay | Admitting: Emergency Medicine

## 2015-04-27 DIAGNOSIS — Z880 Allergy status to penicillin: Secondary | ICD-10-CM | POA: Insufficient documentation

## 2015-04-27 DIAGNOSIS — J449 Chronic obstructive pulmonary disease, unspecified: Secondary | ICD-10-CM | POA: Insufficient documentation

## 2015-04-27 DIAGNOSIS — S2232XD Fracture of one rib, left side, subsequent encounter for fracture with routine healing: Secondary | ICD-10-CM | POA: Diagnosis not present

## 2015-04-27 DIAGNOSIS — K219 Gastro-esophageal reflux disease without esophagitis: Secondary | ICD-10-CM | POA: Insufficient documentation

## 2015-04-27 DIAGNOSIS — E119 Type 2 diabetes mellitus without complications: Secondary | ICD-10-CM | POA: Insufficient documentation

## 2015-04-27 DIAGNOSIS — I10 Essential (primary) hypertension: Secondary | ICD-10-CM | POA: Diagnosis not present

## 2015-04-27 DIAGNOSIS — Z85850 Personal history of malignant neoplasm of thyroid: Secondary | ICD-10-CM | POA: Diagnosis not present

## 2015-04-27 DIAGNOSIS — Z9981 Dependence on supplemental oxygen: Secondary | ICD-10-CM | POA: Insufficient documentation

## 2015-04-27 DIAGNOSIS — E039 Hypothyroidism, unspecified: Secondary | ICD-10-CM | POA: Diagnosis not present

## 2015-04-27 DIAGNOSIS — W010XXD Fall on same level from slipping, tripping and stumbling without subsequent striking against object, subsequent encounter: Secondary | ICD-10-CM | POA: Insufficient documentation

## 2015-04-27 DIAGNOSIS — G4730 Sleep apnea, unspecified: Secondary | ICD-10-CM | POA: Insufficient documentation

## 2015-04-27 DIAGNOSIS — R0789 Other chest pain: Secondary | ICD-10-CM | POA: Diagnosis present

## 2015-04-27 DIAGNOSIS — Z794 Long term (current) use of insulin: Secondary | ICD-10-CM | POA: Diagnosis not present

## 2015-04-27 DIAGNOSIS — F419 Anxiety disorder, unspecified: Secondary | ICD-10-CM | POA: Insufficient documentation

## 2015-04-27 DIAGNOSIS — F329 Major depressive disorder, single episode, unspecified: Secondary | ICD-10-CM | POA: Diagnosis not present

## 2015-04-27 DIAGNOSIS — Z79899 Other long term (current) drug therapy: Secondary | ICD-10-CM | POA: Diagnosis not present

## 2015-04-27 DIAGNOSIS — Z8739 Personal history of other diseases of the musculoskeletal system and connective tissue: Secondary | ICD-10-CM | POA: Insufficient documentation

## 2015-04-27 DIAGNOSIS — W19XXXA Unspecified fall, initial encounter: Secondary | ICD-10-CM

## 2015-04-27 DIAGNOSIS — Z862 Personal history of diseases of the blood and blood-forming organs and certain disorders involving the immune mechanism: Secondary | ICD-10-CM | POA: Diagnosis not present

## 2015-04-27 NOTE — ED Provider Notes (Signed)
CSN: [id number]     Arrival date & time 04/27/15  1418 History  By signing my name below, I, [name] [name], attest that this documentation has been prepared under the direction and in the presence of [name], PA-C. Electronically Signed: [name], ED Scribe. 04/27/2015. 3:33 PM.    Chief Complaint  Patient presents with  . Fall   Patient is a 59 y.o. female presenting with fall. The history is provided by the patient. No language interpreter was used.  Fall This is a recurrent problem. The current episode started 1 to 2 hours ago. The problem has not changed since onset.Associated symptoms include chest pain (Left-sided rib pain secondary to fall). Pertinent negatives include no shortness of breath.   HPI Comments: [name] [name] is a 59 y.o. female who presents to the Emergency Department complaining of a fall that occurred just PTA. Pt states she was walking over stepping stones that were new, when one tipped, causing her to fall on her left side.  She  denies LOC. She notes she was seen for a fall 3 days ago, and states she was diagnosed with 2 rib fractures on the left side, of which she was notified today by her PMD's office. She states she told her PCP about hr fall today, and he recommended she go to the ED. She reports associated left-sided rib pain, which is worse towards the front today. She states the pain from the previous fall was towards the back of the left rib cage. She notes she sleeps in a recliner. She notes getting up from a seated position, as well as getting in and out of her car exacerbate the pain. She notes she has taken 3 Dilaudid pills in total since her fall 3 days ago. She notes she has tried ice packs with no relief. She denies smoking. Pt denies SOB, abdominal pain, head injury, dizziness and n/v.  Past Medical History  Diagnosis Date  . Type 2 diabetes mellitus ([name] [name])   . Essential hypertension, benign   . GERD (gastroesophageal reflux disease)   .  Edema   . Morbid obesity ([name] [name])   . COPD (chronic obstructive pulmonary disease) ([name] [name])   . Asthma   . Arthritis   . Depression   . Cirrhosis of liver ([name])   . Hypothyroidism   . Thyroid cancer ([name] [name])   . Anxiety   . Sleep apnea     CPAP - not consistent  . Anemia    Past Surgical History  Procedure Laterality Date  . Thyroidectomy  2009  . Cholecystectomy  1996  . Tonsillectomy  1958  . Foot surgery Bilateral 2000    Bone spur removed  . Abdominal hysterectomy  1985  . Exploratory laparotomy  2003  . Umbilical hernia repair  2010  . Debridement of abdominal wound  2011  . Cesarean section  1977  . Resection distal clavical  05/07/2012    Procedure: RESECTION DISTAL CLAVICAL;  Surgeon: [name], MD;  Location: [facility];  Service: Orthopedics;  Laterality: Right;  . Shoulder open rotator cuff repair  05/07/2012    Procedure: ROTATOR CUFF REPAIR SHOULDER OPEN;  Surgeon: [name], MD;  Location: [facility];  Service: Orthopedics;  Laterality: Right;  . Incisional hernia repair N/A 06/09/2014    Procedure: RECURRENT  INCISIONAL HERNIORRHAPHY WITH MESH;  Surgeon: [name], MD;  Location: [facility];  Service: General;  Laterality: N/A;  . Insertion of mesh N/A 06/09/2014  Procedure: INSERTION OF MESH;  Surgeon: [name], MD;  Location: [facility];  Service: General;  Laterality: N/A;  . Cardiac catheterization  2012    Dr. Lia Foyer told her nothing was wrong.  . "pump bumps"      bilateral heels--2000  . Incisional hernia repair N/A 10/15/2014    Procedure: LAPAROSCOPIC INCISIONAL HERNIA REPAIR;  Surgeon: [name], MD;  Location: [facility];  Service: General;  Laterality: N/A;  . Insertion of mesh N/A 10/15/2014    Procedure: INSERTION OF MESH;  Surgeon: [name], MD;  Location: [facility];  Service: General;  Laterality: N/A;  . Colonoscopy with propofol N/A 04/17/2015    Procedure: COLONOSCOPY WITH PROPOFOL  at cecum at 0810; withdrawal time =15 minutes;   Surgeon: [name], MD;  Location: [facility];  Service: Endoscopy;  Laterality: N/A;  . Esophagogastroduodenoscopy (egd) with propofol N/A 04/17/2015    Procedure: ESOPHAGOGASTRODUODENOSCOPY (EGD) WITH PROPOFOL;  Surgeon: [name], MD;  Location: [facility];  Service: Endoscopy;  Laterality: N/A;  . Esophageal dilation N/A 04/17/2015    Procedure: ESOPHAGEAL DILATION WITH 56FR MALONEY DILATOR;  Surgeon: [name], MD;  Location: [facility];  Service: Endoscopy;  Laterality: N/A;  . Polypectomy  04/17/2015    Procedure: POLYPECTOMY;  Surgeon: [name], MD;  Location: [facility];  Service: Endoscopy;;   Family History  Problem Relation Age of Onset  . Heart disease    . Arthritis    . Cancer    . Asthma    . Diabetes    . Kidney disease     Social History  Substance Use Topics  . Smoking status: Never Smoker   . Smokeless tobacco: Never Used  . Alcohol Use: No   OB History    No data available     Review of Systems  Respiratory: Negative for shortness of breath.   Cardiovascular: Positive for chest pain (Left-sided rib pain secondary to fall).  Gastrointestinal: Negative for nausea and vomiting.  Musculoskeletal: Positive for myalgias.    Allergies  Doxycycline; Adhesive; Biaxin; Percocet; Clarithromycin; Clindamycin/lincomycin; Neosporin; and Penicillins  Home Medications   Prior to Admission medications   Medication Sig Start Date End Date Taking? Authorizing Provider  acetaminophen (TYLENOL) 500 MG tablet Take 1,000 mg by mouth every 6 (six) hours as needed (pain).   Yes Historical Provider, MD  albuterol (PROVENTIL HFA) 108 (90 BASE) MCG/ACT inhaler Inhale 2 puffs into the lungs every 6 (six) hours as needed for wheezing or shortness of breath. Shortness of breath   Yes Historical Provider, MD  ALPRAZolam (XANAX) 0.5 MG tablet Take 0.5 mg by mouth as needed for anxiety.   Yes Historical Provider, MD  amLODipine-valsartan (EXFORGE) 10-320 MG per tablet Take 1  tablet by mouth daily.  08/21/14  Yes Historical Provider, MD  buPROPion (WELLBUTRIN XL) 300 MG 24 hr tablet Take 300 mg by mouth daily.  08/21/14  Yes Historical Provider, MD  colesevelam (WELCHOL) 625 MG tablet Take 1,875 mg by mouth 2 (two) times daily with a meal. 3 tabs am 3 tabs pm   Yes Historical Provider, MD  Cyanocobalamin (B-12 COMPLIANCE INJECTION) 1000 MCG/ML KIT Inject as directed every 30 (thirty) days.    Yes Historical Provider, MD  diltiazem (CARDIZEM CD) 240 MG 24 hr capsule TAKE ONE (1) CAPSULE EACH DAY 04/20/15  Yes Satira Sark, MD  DULoxetine (CYMBALTA) 60 MG capsule Take 120 mg by mouth daily.  03/20/15  Yes Historical Provider,  MD  furosemide (LASIX) 20 MG tablet Take 20 mg by mouth daily.   Yes Historical Provider, MD  HYDROmorphone (DILAUDID) 4 MG tablet Take 4 mg by mouth every 6 (six) hours as needed for severe pain.  08/21/14  Yes Historical Provider, MD  insulin glargine (LANTUS SOLOSTAR) 100 UNIT/ML injection Inject 15 Units into the skin at bedtime.    Yes Historical Provider, MD  liothyronine (CYTOMEL) 5 MCG tablet Take 5 mcg by mouth daily.   Yes Historical Provider, MD  metFORMIN (GLUCOPHAGE) 1000 MG tablet Take 1,000 mg by mouth 2 (two) times daily with a meal.   Yes Historical Provider, MD  nystatin (NYSTOP) 100000 UNIT/GM POWD Apply 100,000 g topically daily. Rash 08/18/10  Yes Historical Provider, MD  pantoprazole (PROTONIX) 40 MG tablet TAKE ONE (1) TABLET BY MOUTH EVERY DAY BEFORE BREAKFAST 03/25/15  Yes [name], MD  SYNTHROID 300 MCG tablet Take 300 mcg by mouth daily before breakfast.  03/20/15  Yes Historical Provider, MD  TOVIAZ 4 MG TB24 tablet Take 4 mg by mouth at bedtime.  03/20/15  Yes Historical Provider, MD  VOLTAREN 1 % GEL Apply 4 g topically daily as needed (pain). For pain 10/24/12  Yes Historical Provider, MD   BP 123/95 mmHg  Pulse 101  Temp(Src) 97.4 F (36.3 C) (Oral)  Resp 20  Ht _0  (1.651 m)  Wt 375 lb (170.099 kg)  BMI 62.40  kg/m2  SpO2 96% Physical Exam  Constitutional: She is oriented to person, place, and time. She appears well-developed and well-nourished.  Morbidly obese, no acute distress  HENT:  Head: Normocephalic.  Eyes: EOM are normal.  Neck: Normal range of motion.  Cardiovascular: Normal rate, regular rhythm and normal heart sounds.   Pulmonary/Chest: Effort normal and breath sounds normal. No respiratory distress. She exhibits tenderness.  TTP of the L anterior and lateral ribs, no crepitus  Abdominal: Soft. She exhibits no distension. There is no tenderness.  Musculoskeletal: Normal range of motion.  Neurological: She is alert and oriented to person, place, and time.  Psychiatric: She has a normal mood and affect.  Nursing note and vitals reviewed.   ED Course  Procedures  DIAGNOSTIC STUDIES: Oxygen Saturation is 96% on RA, adequate by my interpretation.    COORDINATION OF CARE: 3:28 PM - Discussed XR results of no new fractures. Discussed plans to discharge. Discussed use of an incentive spherometer. Pt notes she has one at home. Pt advised of plan for treatment and pt agrees.  Labs Review Labs Reviewed - No data to display  Imaging Review Dg Chest 2 View  04/27/2015  CLINICAL DATA:  Recent fall fracture left fifth rib ; fall earlier today EXAM: CHEST  2 VIEW COMPARISON:  Chest and ribs April 24, 2015 FINDINGS: The known fracture of the left fifth rib is not well seen on frontal and lateral views. There is no evidence suggesting new fracture. No pneumothorax is apparent. Lungs are clear. Heart size and pulmonary vascularity are normal. No effusion. No adenopathy. There is mild degenerative change in the thoracic spine. IMPRESSION: The known left fifth rib fracture is not convincingly seen on current examination. No pneumothorax or effusion. Lungs clear. Cardiac silhouette within normal limits. Electronically Signed   By: Lowella Grip III M.D.   On: 04/27/2015 14:49   I have  personally reviewed and evaluated these images and lab results as part of my medical decision-making.   EKG Interpretation None      MDM  Final diagnoses:  Rib fractures, left, with routine healing, subsequent encounter    Pt with documented rib fx's and returned to ED due to another fall.  Additional imaging does not indicate any new fx's or pneumo.  Pt is well appearing.  Vitals stable.  Appears stable for d/c and agrees to close f/u with PMD. Pt has pain medication at home  I personally performed the services described in this documentation, which was scribed in my presence. The recorded information has been reviewed and is accurate.   Kem Parkinson, PA-C 05/01/15 1517  Milton Ferguson, MD 05/01/15 [phone]

## 2015-04-27 NOTE — Discharge Instructions (Signed)
Rib Fracture A rib fracture is a break or crack in one of the bones of the ribs. The ribs are like a cage that goes around your upper chest. A broken or cracked rib is often painful, but most do not cause other problems. Most rib fractures heal on their own in 1-3 months. HOME CARE  Avoid activities that cause pain to the injured area. Protect your injured area.  Slowly increase activity as told by your doctor.  Take medicine as told by your doctor.  Put ice on the injured area for the first 1-2 days after you have been treated or as told by your doctor.  Put ice in a plastic bag.  Place a towel between your skin and the bag.  Leave the ice on for 15-20 minutes at a time, every 2 hours while you are awake.  Do deep breathing as told by your doctor. You may be told to:  Take deep breaths many times a day.  Cough many times a day while hugging a pillow.  Use a device (incentive spirometer) to perform deep breathing many times a day.  Drink enough fluids to keep your pee (urine) clear or pale yellow.   Do not wear a rib belt or binder. These do not allow you to breathe deeply. GET HELP RIGHT AWAY IF:   You have a fever.  You have trouble breathing.   You cannot stop coughing.  You cough up thick or bloody spit (mucus).   You feel sick to your stomach (nauseous), throw up (vomit), or have belly (abdominal) pain.   Your pain gets worse and medicine does not help.  MAKE SURE YOU:   Understand these instructions.  Will watch your condition.  Will get help right away if you are not doing well or get worse.   This information is not intended to replace advice given to you by your health care provider. Make sure you discuss any questions you have with your health care provider.   Document Released: 03/15/2008 Document Revised: 10/01/2012 Document Reviewed: 08/08/2012 Elsevier Interactive Patient Education [employer].

## 2015-04-27 NOTE — ED Notes (Signed)
Pt was going down some stepping stones when one tilted and she ended up falling to the ground-- fell onto her lt ribcage area  Came to checked as on Friday she had fallen and sustained rib fractures x2 on Lt side

## 2015-07-04 ENCOUNTER — Emergency Department (HOSPITAL_COMMUNITY)
Admission: EM | Admit: 2015-07-04 | Discharge: 2015-07-04 | Disposition: A | Discharge status: Home / Self Care | Payer: PPO | Attending: Emergency Medicine | Admitting: Emergency Medicine

## 2015-07-04 ENCOUNTER — Encounter (HOSPITAL_COMMUNITY): Payer: Self-pay | Admitting: Emergency Medicine

## 2015-07-04 ENCOUNTER — Emergency Department (HOSPITAL_COMMUNITY): Payer: PPO

## 2015-07-04 DIAGNOSIS — I10 Essential (primary) hypertension: Secondary | ICD-10-CM | POA: Insufficient documentation

## 2015-07-04 DIAGNOSIS — G4730 Sleep apnea, unspecified: Secondary | ICD-10-CM | POA: Diagnosis not present

## 2015-07-04 DIAGNOSIS — J449 Chronic obstructive pulmonary disease, unspecified: Secondary | ICD-10-CM | POA: Insufficient documentation

## 2015-07-04 DIAGNOSIS — Y998 Other external cause status: Secondary | ICD-10-CM | POA: Diagnosis not present

## 2015-07-04 DIAGNOSIS — S40812A Abrasion of left upper arm, initial encounter: Secondary | ICD-10-CM | POA: Diagnosis not present

## 2015-07-04 DIAGNOSIS — Z880 Allergy status to penicillin: Secondary | ICD-10-CM | POA: Diagnosis not present

## 2015-07-04 DIAGNOSIS — Z794 Long term (current) use of insulin: Secondary | ICD-10-CM | POA: Insufficient documentation

## 2015-07-04 DIAGNOSIS — L089 Local infection of the skin and subcutaneous tissue, unspecified: Secondary | ICD-10-CM | POA: Insufficient documentation

## 2015-07-04 DIAGNOSIS — Z8739 Personal history of other diseases of the musculoskeletal system and connective tissue: Secondary | ICD-10-CM | POA: Diagnosis not present

## 2015-07-04 DIAGNOSIS — Z9981 Dependence on supplemental oxygen: Secondary | ICD-10-CM | POA: Insufficient documentation

## 2015-07-04 DIAGNOSIS — F419 Anxiety disorder, unspecified: Secondary | ICD-10-CM | POA: Diagnosis not present

## 2015-07-04 DIAGNOSIS — S6992XA Unspecified injury of left wrist, hand and finger(s), initial encounter: Secondary | ICD-10-CM | POA: Diagnosis not present

## 2015-07-04 DIAGNOSIS — F329 Major depressive disorder, single episode, unspecified: Secondary | ICD-10-CM | POA: Insufficient documentation

## 2015-07-04 DIAGNOSIS — Y9389 Activity, other specified: Secondary | ICD-10-CM | POA: Insufficient documentation

## 2015-07-04 DIAGNOSIS — W5501XA Bitten by cat, initial encounter: Secondary | ICD-10-CM | POA: Insufficient documentation

## 2015-07-04 DIAGNOSIS — Z85850 Personal history of malignant neoplasm of thyroid: Secondary | ICD-10-CM | POA: Insufficient documentation

## 2015-07-04 DIAGNOSIS — E119 Type 2 diabetes mellitus without complications: Secondary | ICD-10-CM | POA: Diagnosis not present

## 2015-07-04 DIAGNOSIS — S61452A Open bite of left hand, initial encounter: Secondary | ICD-10-CM | POA: Diagnosis not present

## 2015-07-04 DIAGNOSIS — E039 Hypothyroidism, unspecified: Secondary | ICD-10-CM | POA: Diagnosis not present

## 2015-07-04 DIAGNOSIS — S61402A Unspecified open wound of left hand, initial encounter: Secondary | ICD-10-CM | POA: Diagnosis not present

## 2015-07-04 DIAGNOSIS — Z862 Personal history of diseases of the blood and blood-forming organs and certain disorders involving the immune mechanism: Secondary | ICD-10-CM | POA: Diagnosis not present

## 2015-07-04 DIAGNOSIS — M79642 Pain in left hand: Secondary | ICD-10-CM | POA: Diagnosis not present

## 2015-07-04 DIAGNOSIS — Y9289 Other specified places as the place of occurrence of the external cause: Secondary | ICD-10-CM | POA: Diagnosis not present

## 2015-07-04 DIAGNOSIS — Z79899 Other long term (current) drug therapy: Secondary | ICD-10-CM | POA: Diagnosis not present

## 2015-07-04 DIAGNOSIS — S50812A Abrasion of left forearm, initial encounter: Secondary | ICD-10-CM | POA: Diagnosis not present

## 2015-07-04 DIAGNOSIS — K219 Gastro-esophageal reflux disease without esophagitis: Secondary | ICD-10-CM | POA: Diagnosis not present

## 2015-07-04 DIAGNOSIS — Z7984 Long term (current) use of oral hypoglycemic drugs: Secondary | ICD-10-CM | POA: Diagnosis not present

## 2015-07-04 MED ORDER — CEPHALEXIN 500 MG PO CAPS: 500.0000 mg | ORAL_CAPSULE | Freq: Four times a day (QID) | ORAL | Status: DC

## 2015-07-04 MED ORDER — SULFAMETHOXAZOLE-TRIMETHOPRIM 800-160 MG PO TABS: 1.0000 | ORAL_TABLET | Freq: Two times a day (BID) | ORAL | Status: AC

## 2015-07-04 NOTE — Discharge Instructions (Signed)

## 2015-07-04 NOTE — ED Notes (Signed)
Redness noted to palmar aspect of left hand around base of thumb.  Warm to touch up to wrist area.  Pt reports no injury although scab noted to opposing side of hand.  States that this is possibly from cat scratch or from scratching herself as she has a habit of picking scabs.

## 2015-07-04 NOTE — ED Notes (Signed)
Pt reports LT hand pain and edema that has progressively gotten worse over the past few weeks. Denies recent injury/fall. No deformity noted.

## 2015-07-04 NOTE — ED Provider Notes (Signed)
CSN: [id number]     Arrival date & time 07/04/15  1115 History   First MD Initiated Contact with Patient 07/04/15 1131     Chief Complaint  Patient presents with  . Hand Pain     (Consider location/radiation/quality/duration/timing/severity/associated sxs/prior Treatment) Patient is a 60 y.o. female presenting with hand pain. The history is provided by the patient.  Hand Pain This is a new problem. The current episode started today. The problem occurs constantly. The problem has been gradually worsening. Nothing aggravates the symptoms. She has tried nothing for the symptoms. The treatment provided moderate relief.   Pt reports she picks at her skin.  Pt also reports her cat bites her frequently.  Pt reports swelling around wound Past Medical History  Diagnosis Date  . Type 2 diabetes mellitus ([place])   . Essential hypertension, benign   . GERD (gastroesophageal reflux disease)   . Edema   . Morbid obesity ([place])   . COPD (chronic obstructive pulmonary disease) ([place])   . Asthma   . Arthritis   . Depression   . Cirrhosis of liver ([place])   . Hypothyroidism   . Thyroid cancer ([place])   . Anxiety   . Sleep apnea     CPAP - not consistent  . Anemia    Past Surgical History  Procedure Laterality Date  . Thyroidectomy  2009  . Cholecystectomy  1996  . Tonsillectomy  1958  . Foot surgery Bilateral 2000    Bone spur removed  . Abdominal hysterectomy  1985  . Exploratory laparotomy  2003  . Umbilical hernia repair  2010  . Debridement of abdominal wound  2011  . Cesarean section  1977  . Resection distal clavical  05/07/2012    Procedure: RESECTION DISTAL CLAVICAL;  Surgeon: [name], MD;  Location: [facility];  Service: Orthopedics;  Laterality: Right;  . Shoulder open rotator cuff repair  05/07/2012    Procedure: ROTATOR CUFF REPAIR SHOULDER OPEN;  Surgeon: [name], MD;  Location: [facility];  Service: Orthopedics;  Laterality: Right;  . Incisional hernia repair N/A  06/09/2014    Procedure: RECURRENT  INCISIONAL HERNIORRHAPHY WITH MESH;  Surgeon: [name], MD;  Location: [facility];  Service: General;  Laterality: N/A;  . Insertion of mesh N/A 06/09/2014    Procedure: INSERTION OF MESH;  Surgeon: [name], MD;  Location: [facility];  Service: General;  Laterality: N/A;  . Cardiac catheterization  2012    Dr. Lia Foyer told her nothing was wrong.  . "pump bumps"      bilateral heels--2000  . Incisional hernia repair N/A 10/15/2014    Procedure: LAPAROSCOPIC INCISIONAL HERNIA REPAIR;  Surgeon: [name], MD;  Location: [place];  Service: General;  Laterality: N/A;  . Insertion of mesh N/A 10/15/2014    Procedure: INSERTION OF MESH;  Surgeon: [name], MD;  Location: [place];  Service: General;  Laterality: N/A;  . Colonoscopy with propofol N/A 04/17/2015    Procedure: COLONOSCOPY WITH PROPOFOL  at cecum at 0810; withdrawal time =15 minutes;  Surgeon: [name], MD;  Location: [facility];  Service: Endoscopy;  Laterality: N/A;  . Esophagogastroduodenoscopy (egd) with propofol N/A 04/17/2015    Procedure: ESOPHAGOGASTRODUODENOSCOPY (EGD) WITH PROPOFOL;  Surgeon: [name], MD;  Location: [facility];  Service: Endoscopy;  Laterality: N/A;  . Esophageal dilation N/A 04/17/2015    Procedure: ESOPHAGEAL DILATION WITH 56FR MALONEY DILATOR;  Surgeon: [name], MD;  Location: [facility];  Service: Endoscopy;  Laterality: N/A;  . Polypectomy  04/17/2015    Procedure: POLYPECTOMY;  Surgeon: [name], MD;  Location: [facility];  Service: Endoscopy;;   Family History  Problem Relation Age of Onset  . Heart disease    . Arthritis    . Cancer    . Asthma    . Diabetes    . Kidney disease     Social History  Substance Use Topics  . Smoking status: Never Smoker   . Smokeless tobacco: Never Used  . Alcohol Use: No   OB History    No data available     Review of Systems  All other systems reviewed and are negative.     Allergies   Doxycycline; Adhesive; Biaxin; Percocet; Clarithromycin; Clindamycin/lincomycin; Neosporin; and Penicillins  Home Medications   Prior to Admission medications   Medication Sig Start Date End Date Taking? Authorizing Provider  acetaminophen (TYLENOL) 500 MG tablet Take 1,000 mg by mouth every 6 (six) hours as needed (pain).    Historical Provider, MD  albuterol (PROVENTIL HFA) 108 (90 BASE) MCG/ACT inhaler Inhale 2 puffs into the lungs every 6 (six) hours as needed for wheezing or shortness of breath. Shortness of breath    Historical Provider, MD  ALPRAZolam Duanne Moron) 0.5 MG tablet Take 0.5 mg by mouth as needed for anxiety.    Historical Provider, MD  amLODipine-valsartan (EXFORGE) 10-320 MG per tablet Take 1 tablet by mouth daily.  08/21/14   Historical Provider, MD  buPROPion (WELLBUTRIN XL) 300 MG 24 hr tablet Take 300 mg by mouth daily.  08/21/14   Historical Provider, MD  colesevelam (WELCHOL) 625 MG tablet Take 1,875 mg by mouth 2 (two) times daily with a meal. 3 tabs am 3 tabs pm    Historical Provider, MD  Cyanocobalamin (B-12 COMPLIANCE INJECTION) 1000 MCG/ML KIT Inject as directed every 30 (thirty) days.     Historical Provider, MD  diltiazem (CARDIZEM CD) 240 MG 24 hr capsule TAKE ONE (1) CAPSULE EACH DAY 04/20/15   Satira Sark, MD  DULoxetine (CYMBALTA) 60 MG capsule Take 120 mg by mouth daily.  03/20/15   Historical Provider, MD  furosemide (LASIX) 20 MG tablet Take 20 mg by mouth daily.    Historical Provider, MD  HYDROmorphone (DILAUDID) 4 MG tablet Take 4 mg by mouth every 6 (six) hours as needed for severe pain.  08/21/14   Historical Provider, MD  insulin glargine (LANTUS SOLOSTAR) 100 UNIT/ML injection Inject 15 Units into the skin at bedtime.     Historical Provider, MD  liothyronine (CYTOMEL) 5 MCG tablet Take 5 mcg by mouth daily.    Historical Provider, MD  metFORMIN (GLUCOPHAGE) 1000 MG tablet Take 1,000 mg by mouth 2 (two) times daily with a meal.    Historical Provider,  MD  nystatin (NYSTOP) 100000 UNIT/GM POWD Apply 100,000 g topically daily. Rash 08/18/10   Historical Provider, MD  pantoprazole (PROTONIX) 40 MG tablet TAKE ONE (1) TABLET BY MOUTH EVERY DAY BEFORE BREAKFAST 03/25/15   [name], MD  SYNTHROID 300 MCG tablet Take 300 mcg by mouth daily before breakfast.  03/20/15   Historical Provider, MD  TOVIAZ 4 MG TB24 tablet Take 4 mg by mouth at bedtime.  03/20/15   Historical Provider, MD  VOLTAREN 1 % GEL Apply 4 g topically daily as needed (pain). For pain 10/24/12   Historical Provider, MD   BP 189/72 mmHg  Pulse 109  Temp(Src) 97.8 F (  36.6 C) (Oral)  Resp 20  Ht _0  (1.651 m)  Wt 88.451 kg  BMI 32.45 kg/m2  SpO2 100% Physical Exam  Constitutional: She is oriented to person, place, and time. She appears well-developed and well-nourished.  HENT:  Head: Normocephalic.  Musculoskeletal: She exhibits tenderness.  Swollen left hand thenar area,  Pain with range of motion thumb.   Pt has multiple abrasions and scratches left arm.  Neurological: She is alert and oriented to person, place, and time.  Skin: There is erythema.  Psychiatric: She has a normal mood and affect.  Nursing note and vitals reviewed.   ED Course  Procedures (including critical care time) Labs Review Labs Reviewed - No data to display  Imaging Review Dg Hand Complete Left  07/04/2015  CLINICAL DATA:  Left hand pain EXAM: LEFT HAND - COMPLETE 3+ VIEW COMPARISON:  None. FINDINGS: Chronic changes at the base of the first metacarpal. No acute fracture. No dislocation. No destructive bone lesion. IMPRESSION: No acute bony pathology. Electronically Signed   By: Marybelle Killings M.D.   On: 07/04/2015 12:06   I have personally reviewed and evaluated these images and lab results as part of my medical decision-making.   EKG Interpretation None      MDM  Pt counseled on risk of worsening if a cat bite.  She is to recheck in2 days.  Pt has multiple drug allergies limiting the  medications I can use to treat her   Final diagnoses:  Bite wound of left hand with infection, initial encounter    Keflex Bactrim Recheck in 2 days    Fransico Meadow, PA-C 07/04/15 1303  Nat Christen, MD 07/04/15 1350

## 2015-07-16 ENCOUNTER — Encounter (INDEPENDENT_AMBULATORY_CARE_PROVIDER_SITE_OTHER): Payer: Self-pay | Admitting: Internal Medicine

## 2015-07-24 DIAGNOSIS — E538 Deficiency of other specified B group vitamins: Secondary | ICD-10-CM | POA: Diagnosis not present

## 2015-08-21 ENCOUNTER — Other Ambulatory Visit: Payer: Self-pay | Admitting: Surgery

## 2015-08-21 DIAGNOSIS — R2231 Localized swelling, mass and lump, right upper limb: Secondary | ICD-10-CM | POA: Diagnosis not present

## 2015-08-26 DIAGNOSIS — E538 Deficiency of other specified B group vitamins: Secondary | ICD-10-CM | POA: Diagnosis not present

## 2015-08-26 DIAGNOSIS — I10 Essential (primary) hypertension: Secondary | ICD-10-CM | POA: Diagnosis not present

## 2015-08-26 DIAGNOSIS — Z6841 Body Mass Index (BMI) 40.0 and over, adult: Secondary | ICD-10-CM | POA: Diagnosis not present

## 2015-08-26 DIAGNOSIS — M1991 Primary osteoarthritis, unspecified site: Secondary | ICD-10-CM | POA: Diagnosis not present

## 2015-08-26 DIAGNOSIS — Z1389 Encounter for screening for other disorder: Secondary | ICD-10-CM | POA: Diagnosis not present

## 2015-08-26 DIAGNOSIS — E1140 Type 2 diabetes mellitus with diabetic neuropathy, unspecified: Secondary | ICD-10-CM | POA: Diagnosis not present

## 2015-08-26 DIAGNOSIS — E1165 Type 2 diabetes mellitus with hyperglycemia: Secondary | ICD-10-CM | POA: Diagnosis not present

## 2015-08-26 DIAGNOSIS — E782 Mixed hyperlipidemia: Secondary | ICD-10-CM | POA: Diagnosis not present

## 2015-09-08 ENCOUNTER — Other Ambulatory Visit (HOSPITAL_COMMUNITY): Payer: Self-pay | Admitting: Internal Medicine

## 2015-09-08 ENCOUNTER — Other Ambulatory Visit: Payer: Self-pay | Admitting: Registered Nurse

## 2015-09-08 DIAGNOSIS — Z1231 Encounter for screening mammogram for malignant neoplasm of breast: Secondary | ICD-10-CM

## 2015-09-11 ENCOUNTER — Ambulatory Visit (HOSPITAL_COMMUNITY)
Admission: RE | Admit: 2015-09-11 | Discharge: 2015-09-11 | Disposition: A | Discharge status: Home / Self Care | Payer: PPO | Source: Ambulatory Visit | Attending: Internal Medicine | Admitting: Internal Medicine

## 2015-09-11 DIAGNOSIS — Z1231 Encounter for screening mammogram for malignant neoplasm of breast: Secondary | ICD-10-CM | POA: Insufficient documentation

## 2015-09-14 DIAGNOSIS — D1721 Benign lipomatous neoplasm of skin and subcutaneous tissue of right arm: Secondary | ICD-10-CM | POA: Diagnosis not present

## 2015-09-23 DIAGNOSIS — B353 Tinea pedis: Secondary | ICD-10-CM | POA: Diagnosis not present

## 2015-09-23 DIAGNOSIS — Z6841 Body Mass Index (BMI) 40.0 and over, adult: Secondary | ICD-10-CM | POA: Diagnosis not present

## 2015-09-23 DIAGNOSIS — M1991 Primary osteoarthritis, unspecified site: Secondary | ICD-10-CM | POA: Diagnosis not present

## 2015-09-23 DIAGNOSIS — Z1389 Encounter for screening for other disorder: Secondary | ICD-10-CM | POA: Diagnosis not present

## 2015-09-23 DIAGNOSIS — E538 Deficiency of other specified B group vitamins: Secondary | ICD-10-CM | POA: Diagnosis not present

## 2015-09-23 DIAGNOSIS — I10 Essential (primary) hypertension: Secondary | ICD-10-CM | POA: Diagnosis not present

## 2015-09-23 DIAGNOSIS — L0101 Non-bullous impetigo: Secondary | ICD-10-CM | POA: Diagnosis not present

## 2015-09-23 DIAGNOSIS — F330 Major depressive disorder, recurrent, mild: Secondary | ICD-10-CM | POA: Diagnosis not present

## 2015-09-23 DIAGNOSIS — E1165 Type 2 diabetes mellitus with hyperglycemia: Secondary | ICD-10-CM | POA: Diagnosis not present

## 2015-09-24 DIAGNOSIS — Z1389 Encounter for screening for other disorder: Secondary | ICD-10-CM | POA: Diagnosis not present

## 2015-09-24 DIAGNOSIS — E538 Deficiency of other specified B group vitamins: Secondary | ICD-10-CM | POA: Diagnosis not present

## 2015-09-24 DIAGNOSIS — R5383 Other fatigue: Secondary | ICD-10-CM | POA: Diagnosis not present

## 2015-10-07 DIAGNOSIS — D649 Anemia, unspecified: Secondary | ICD-10-CM | POA: Diagnosis not present

## 2015-10-15 DIAGNOSIS — D509 Iron deficiency anemia, unspecified: Secondary | ICD-10-CM | POA: Diagnosis not present

## 2015-10-15 DIAGNOSIS — R252 Cramp and spasm: Secondary | ICD-10-CM | POA: Diagnosis not present

## 2015-10-15 DIAGNOSIS — B351 Tinea unguium: Secondary | ICD-10-CM | POA: Diagnosis not present

## 2015-10-15 DIAGNOSIS — L84 Corns and callosities: Secondary | ICD-10-CM | POA: Diagnosis not present

## 2015-10-15 DIAGNOSIS — R201 Hypoesthesia of skin: Secondary | ICD-10-CM | POA: Diagnosis not present

## 2015-10-15 DIAGNOSIS — H1089 Other conjunctivitis: Secondary | ICD-10-CM | POA: Diagnosis not present

## 2015-10-15 DIAGNOSIS — Z6841 Body Mass Index (BMI) 40.0 and over, adult: Secondary | ICD-10-CM | POA: Diagnosis not present

## 2015-10-15 DIAGNOSIS — Z1389 Encounter for screening for other disorder: Secondary | ICD-10-CM | POA: Diagnosis not present

## 2015-10-22 ENCOUNTER — Other Ambulatory Visit (INDEPENDENT_AMBULATORY_CARE_PROVIDER_SITE_OTHER): Payer: Self-pay | Admitting: Internal Medicine

## 2015-10-26 DIAGNOSIS — D510 Vitamin B12 deficiency anemia due to intrinsic factor deficiency: Secondary | ICD-10-CM | POA: Diagnosis not present

## 2015-10-27 ENCOUNTER — Ambulatory Visit (INDEPENDENT_AMBULATORY_CARE_PROVIDER_SITE_OTHER): Payer: PPO | Admitting: Internal Medicine

## 2015-10-27 ENCOUNTER — Encounter (INDEPENDENT_AMBULATORY_CARE_PROVIDER_SITE_OTHER): Payer: Self-pay | Admitting: Internal Medicine

## 2015-10-27 ENCOUNTER — Encounter (INDEPENDENT_AMBULATORY_CARE_PROVIDER_SITE_OTHER): Payer: Self-pay | Admitting: *Deleted

## 2015-10-27 VITALS — BP 150/92 | HR 80 | Temp 97.8°F | Resp 18 | Ht 65.0 in | Wt 314.0 lb

## 2015-10-27 DIAGNOSIS — E669 Obesity, unspecified: Secondary | ICD-10-CM

## 2015-10-27 DIAGNOSIS — K7460 Unspecified cirrhosis of liver: Secondary | ICD-10-CM | POA: Diagnosis not present

## 2015-10-27 DIAGNOSIS — K219 Gastro-esophageal reflux disease without esophagitis: Secondary | ICD-10-CM | POA: Diagnosis not present

## 2015-10-27 NOTE — Progress Notes (Signed)
Presenting complaint;  Follow-up for chronic liver disease.  Subjective:  Patient is 60 year old Caucasian female who is here for scheduled visit. She has cirrhosis secondary to NASH and following her last visit in October 2016 she underwent EGD and colonoscopy. EGD revealed portal gastropathy but no evidence of varices. She had gastritis and H. pylori serology was negative. She had small polyp removed at colonoscopy and was inflammatory. She states she was found to be anemic by Dr. Riley Kill and begun on by mouth iron. She denies melena or rectal bleeding. Heartburn is well controlled. She complains of knee pain. She is now on meloxicam. She is not having any side effects. She has gained 14 pounds since her last visit 6 months ago. She continues to have intractable pruritus. She has an appointment to see Dr. Nevada Crane of dermatology later this month.   Current Medications: Outpatient Encounter Prescriptions as of 10/27/2015  Medication Sig  . acetaminophen (TYLENOL) 500 MG tablet Take 1,000 mg by mouth every 6 (six) hours as needed (pain).  [name] albuterol (PROVENTIL HFA) 108 (90 BASE) MCG/ACT inhaler Inhale 2 puffs into the lungs every 6 (six) hours as needed for wheezing or shortness of breath. Shortness of breath  . ALPRAZolam (XANAX) 0.5 MG tablet Take 0.5 mg by mouth as needed for anxiety.  [name] buPROPion (WELLBUTRIN XL) 300 MG 24 hr tablet Take 300 mg by mouth daily.   . colesevelam (WELCHOL) 625 MG tablet Take 1,875 mg by mouth 2 (two) times daily with a meal. 3 tabs am 3 tabs pm  . Cyanocobalamin (B-12 COMPLIANCE INJECTION) 1000 MCG/ML KIT Inject as directed every 30 (thirty) days.   [name] diltiazem (CARDIZEM CD) 240 MG 24 hr capsule TAKE ONE (1) CAPSULE EACH DAY  . DULoxetine (CYMBALTA) 60 MG capsule Take 120 mg by mouth daily.   . furosemide (LASIX) 20 MG tablet Take 20 mg by mouth daily.  [name] HYDROmorphone (DILAUDID) 4 MG tablet Take 4 mg by mouth every 6 (six) hours as needed for severe pain.   [name]  insulin glargine (LANTUS SOLOSTAR) 100 UNIT/ML injection Inject 30 Units into the skin at bedtime.   [name] liothyronine (CYTOMEL) 5 MCG tablet Take 5 mcg by mouth daily.  [name] lisinopril (PRINIVIL,ZESTRIL) 10 MG tablet Take 10 mg by mouth daily.   . meloxicam (MOBIC) 15 MG tablet Take 15 mg by mouth daily.   . metFORMIN (GLUCOPHAGE) 1000 MG tablet Take 1,000 mg by mouth 2 (two) times daily with a meal.  . methocarbamol (ROBAXIN) 500 MG tablet Take 500 mg by mouth 3 (three) times daily.   [name] nystatin (NYSTOP) 100000 UNIT/GM POWD Apply 100,000 g topically daily. Rash  . pantoprazole (PROTONIX) 40 MG tablet TAKE ONE (1) TABLET BY MOUTH EVERY DAY BEFORE BREAKFAST  . SYNTHROID 300 MCG tablet Take 300 mcg by mouth daily before breakfast.   . tolterodine (DETROL) 2 MG tablet Take 2 mg by mouth 2 (two) times daily.   . VOLTAREN 1 % GEL Apply 4 g topically daily as needed (pain). For pain  . [DISCONTINUED] amLODipine-valsartan (EXFORGE) 10-320 MG per tablet Take 1 tablet by mouth daily. Reported on 10/27/2015  . [DISCONTINUED] cephALEXin (KEFLEX) 500 MG capsule Take 1 capsule (500 mg total) by mouth 4 (four) times daily. (Patient not taking: Reported on 10/27/2015)  . [DISCONTINUED] TOVIAZ 4 MG TB24 tablet Take 4 mg by mouth at bedtime. Reported on 10/27/2015   No facility-administered encounter medications on file as of 10/27/2015.     Objective: Blood  pressure 150/92, pulse 80, temperature 97.8 F (36.6 C), temperature source Oral, resp. rate 18, height 5' 5"  (1.651 m), weight 314 lb (142.429 kg). Patient is alert and does not have asterixis. Conjunctiva is pink. Sclera is nonicteric Oropharyngeal mucosa is normal. No neck masses or thyromegaly noted. Cardiac exam with regular rhythm normal S1 and S2. No murmur or gallop noted. Lungs are clear to auscultation. Abdomen is very obese with multiple scratch marks. Difficult to palpate liver or spleen. No tenderness noted.  No LE edema or clubbing noted. She has  scratch marks over her upper or lower extremities as well as abdomen.  Assessment:  #1. Cirrhosis secondary to NASH. EGD in October 2016 revealed portal gastropathy but no evidence of varices. While she has preserved hepatic function this situation will not last for long given continued weight gain. She is due for [facility] screening. #2. Dysphagia. Her esophagus was dilated back in October 2016 and she responded to esophageal dilation. #3. Morbid obesity. This condition is taking told on her health and will shorten her life span. She may benefit from dietary consultation. #4. Anemia discovered by Dr. Gerarda Fraction recently. Patient was begun on by mouth iron. She had EGD and colonoscopy in October 2016. She did not have stigmata of chronic GI blood loss and small polyp was inflammatory. Anemia most likely due to impaired iron absorption in the setting of chronic PPI therapy. Another potential risk would be NSAID induced GI injury secondary to meloxicam. #5. Intractable pruritus. Pruritus is not due to chronic liver disease. She had liver biopsy and it did not show changes of PBC. Patient has an appointment to see Dr. Nevada Crane and will wait for his impression and recommendations.   Plan:  Dietary consultation in order to palpation control her calorie intake and hopefully lose some weight. AFP. Upper abdominal ultrasound. Office visit in 6 months.

## 2015-10-28 ENCOUNTER — Other Ambulatory Visit (INDEPENDENT_AMBULATORY_CARE_PROVIDER_SITE_OTHER): Payer: Self-pay | Admitting: Internal Medicine

## 2015-10-28 ENCOUNTER — Ambulatory Visit (INDEPENDENT_AMBULATORY_CARE_PROVIDER_SITE_OTHER): Payer: PPO | Admitting: Cardiology

## 2015-10-28 ENCOUNTER — Encounter: Payer: Self-pay | Admitting: Cardiology

## 2015-10-28 VITALS — BP 184/90 | HR 95 | Ht 65.0 in | Wt 313.0 lb

## 2015-10-28 DIAGNOSIS — R002 Palpitations: Secondary | ICD-10-CM | POA: Diagnosis not present

## 2015-10-28 DIAGNOSIS — E669 Obesity, unspecified: Secondary | ICD-10-CM

## 2015-10-28 DIAGNOSIS — I10 Essential (primary) hypertension: Secondary | ICD-10-CM | POA: Diagnosis not present

## 2015-10-28 LAB — AFP TUMOR MARKER: AFP-Tumor Marker: 3.7 ng/mL (ref <6.1)

## 2015-10-28 MED ORDER — DILTIAZEM HCL ER COATED BEADS 300 MG PO CP24: 300.0000 mg | ORAL_CAPSULE | Freq: Every day | ORAL | Status: DC

## 2015-10-28 NOTE — Progress Notes (Signed)
Cardiology Office Note  Date: 10/28/2015   ID: [name], [name] December 27, 1955, MRN [id number]  PCP: [name] [name]., MD  Primary Cardiologist: [name] Lesches, MD   Chief Complaint  Patient presents with  . Hypertension    History of Present Illness: [name] [name] is a 60 y.o. female last seen in June 2016. She presents for a routine follow-up visit. She reports occasional sense of palpitations, especially when she is active. She has had no dizziness or syncope. No recurring chest pain.  Blood pressure was elevated today. She reports compliance with her medications which are outlined below. We did discuss increasing her Cardizem CD dose, also following up with Dr. Gerarda [name] in case she needs further adjustments.  ECG reviewed today showing sinus tachycardia with PACs and low voltage, nonspecific ST changes.  Past Medical History  Diagnosis Date  . Type 2 diabetes mellitus ([employer])   . Essential hypertension, benign   . GERD (gastroesophageal reflux disease)   . Edema   . Morbid obesity ([place])   . COPD (chronic obstructive pulmonary disease) ([place])   . Asthma   . Arthritis   . Depression   . Cirrhosis of liver ([place])   . Hypothyroidism   . Thyroid cancer ([place])   . Anxiety   . Sleep apnea     CPAP - not consistent  . Anemia     Current Outpatient Prescriptions  Medication Sig Dispense Refill  . acetaminophen (TYLENOL) 500 MG tablet Take 1,000 mg by mouth every 6 (six) hours as needed (pain).    [name] albuterol (PROVENTIL HFA) 108 (90 BASE) MCG/ACT inhaler Inhale 2 puffs into the lungs every 6 (six) hours as needed for wheezing or shortness of breath. Shortness of breath    . ALPRAZolam (XANAX) 0.5 MG tablet Take 0.5 mg by mouth as needed for anxiety.    . colesevelam (WELCHOL) 625 MG tablet Take 1,875 mg by mouth 2 (two) times daily with a meal. 3 tabs am 3 tabs pm    . Cyanocobalamin (B-12 COMPLIANCE INJECTION) 1000 MCG/ML KIT Inject as directed every 30 (thirty) days.     .  DULoxetine (CYMBALTA) 60 MG capsule Take 90 mg by mouth daily.   4  . furosemide (LASIX) 20 MG tablet Take 20 mg by mouth daily.    [name] HYDROmorphone (DILAUDID) 4 MG tablet Take 4 mg by mouth every 6 (six) hours as needed for severe pain.   0  . insulin glargine (LANTUS SOLOSTAR) 100 UNIT/ML injection Inject 30 Units into the skin at bedtime.     [name] liothyronine (CYTOMEL) 5 MCG tablet Take 5 mcg by mouth daily.    [name] lisinopril (PRINIVIL,ZESTRIL) 10 MG tablet Take 10 mg by mouth daily.     . meloxicam (MOBIC) 15 MG tablet Take 15 mg by mouth daily.     . metFORMIN (GLUCOPHAGE) 1000 MG tablet Take 1,000 mg by mouth 2 (two) times daily with a meal.    . methocarbamol (ROBAXIN) 500 MG tablet Take 500 mg by mouth 3 (three) times daily.     [name] nystatin (NYSTOP) 100000 UNIT/GM POWD Apply 100,000 g topically daily. Rash    . pantoprazole (PROTONIX) 40 MG tablet TAKE ONE (1) TABLET BY MOUTH EVERY DAY BEFORE BREAKFAST 30 tablet 5  . SYNTHROID 300 MCG tablet Take 300 mcg by mouth daily before breakfast.   5  . tolterodine (DETROL) 2 MG tablet Take 2 mg by mouth 2 (two) times daily.     [name]  VOLTAREN 1 % GEL Apply 4 g topically daily as needed (pain). For pain     No current facility-administered medications for this visit.   Allergies:  Doxycycline; Adhesive; Biaxin; Percocet; Clarithromycin; Clindamycin/lincomycin; Neosporin; and Penicillins   Social History: The patient  reports that she has never smoked. She has never used smokeless tobacco. She reports that she does not drink alcohol or use illicit drugs.   ROS:  Please see the history of present illness. Otherwise, complete review of systems is positive for chronic anemia, fatigue.  All other systems are reviewed and negative.   Physical Exam: VS:  BP 184/90 mmHg  Pulse 95  Ht _0  (1.651 m)  Wt 313 lb (141.976 kg)  BMI 52.09 kg/m2  SpO2 98%, BMI Body mass index is 52.09 kg/(m^2).  Wt Readings from Last 3 Encounters:  10/28/15 313 lb (141.976 kg)   10/27/15 314 lb (142.429 kg)  07/04/15 195 lb (88.451 kg)    Morbidly obese woman in no acute distress.  HEENT: Conjunctiva and lids normal, oropharynx clear.  Neck: No obvious elevated JVP or bruits, no thyromegaly.  Lungs: Clear auscultation, nonlabored, no wheezing.  Cardiac: Distant regular heart sounds, no significant murmur or gallop.  Abdomen: Morbidly obese with pannus, bowel sounds present, no tenderness or guarding.  Skin: Warm and dry. Some excoriations on her legs. Musculoskeletal: No kyphosis.  Extremities: No pitting edema, distal pulses one plus.  Neuropsychiatric: Alert and oriented x3, affect appropriate.  ECG: I personally reviewed the part tracing from 07/21/2014 which showed sinus rhythm.  Recent Labwork: 04/13/2015: BUN 10; Creatinine, Ser 0.61; Hemoglobin 10.3*; Platelets 164; Potassium 4.3; Sodium 140   Other Studies Reviewed Today:  Lexiscan Myoview from April 2012 suggested probable variable breast attenuation, less likely ischemia anteriorly, LVEF 67%. She did ultimately undergo a cardiac catheterization to clarify this issue in May 2012, and had no evidence of significant obstructive CAD with only mild coronary calcification noted.   Assessment and Plan:  1. Essential hypertension, blood pressure not well controlled today. We are increasing Cardizem CD 300 mg daily in light of palpitations and also PACs by ECG. This may help bring down blood pressure, but I have recommended that she follow-up with Dr. Gerarda [name] in case other medication needs additional adjustments.  2. Palpitations with PACs. Increasing Cardizem CD dose.  3. Morbid obesity. Weight loss would be of benefit and has been discussed.  Current medicines were reviewed with the patient today.   Orders Placed This Encounter  Procedures  . EKG 12-Lead    Disposition: FU with me in 1 year.   Signed, Satira Sark, MD, [facility] 10/28/2015 10:58 AM    [facility] [place]. [address] [place], [place] [address] Phone: [phone]; Fax: [phone]

## 2015-10-28 NOTE — Patient Instructions (Signed)
Medication Instructions:  INCREASE CARDIZEM TO 300 MG DAILY  Labwork: NONE  Testing/Procedures: NONE  Follow-Up: Your physician wants you to follow-up in: [place].  You will receive a reminder letter in the mail two months in advance. If you don't receive a letter, please call our office to schedule the follow-up appointment.   Any Other Special Instructions Will Be Listed Below (If Applicable).     If you need a refill on your cardiac medications before your next appointment, please call your pharmacy.

## 2015-10-29 ENCOUNTER — Other Ambulatory Visit (INDEPENDENT_AMBULATORY_CARE_PROVIDER_SITE_OTHER): Payer: Self-pay | Admitting: *Deleted

## 2015-10-29 ENCOUNTER — Encounter (INDEPENDENT_AMBULATORY_CARE_PROVIDER_SITE_OTHER): Payer: Self-pay

## 2015-11-02 ENCOUNTER — Ambulatory Visit (HOSPITAL_COMMUNITY): Payer: PPO

## 2015-11-02 ENCOUNTER — Other Ambulatory Visit (HOSPITAL_COMMUNITY): Payer: PPO

## 2015-11-03 ENCOUNTER — Ambulatory Visit (HOSPITAL_COMMUNITY)
Admission: RE | Admit: 2015-11-03 | Discharge: 2015-11-03 | Disposition: A | Discharge status: Home / Self Care | Payer: PPO | Source: Ambulatory Visit | Attending: Internal Medicine | Admitting: Internal Medicine

## 2015-11-03 DIAGNOSIS — K7460 Unspecified cirrhosis of liver: Secondary | ICD-10-CM | POA: Insufficient documentation

## 2015-11-03 DIAGNOSIS — R161 Splenomegaly, not elsewhere classified: Secondary | ICD-10-CM | POA: Diagnosis not present

## 2015-11-03 DIAGNOSIS — Z9049 Acquired absence of other specified parts of digestive tract: Secondary | ICD-10-CM | POA: Diagnosis not present

## 2015-11-04 DIAGNOSIS — E1140 Type 2 diabetes mellitus with diabetic neuropathy, unspecified: Secondary | ICD-10-CM | POA: Diagnosis not present

## 2015-11-04 DIAGNOSIS — Z6841 Body Mass Index (BMI) 40.0 and over, adult: Secondary | ICD-10-CM | POA: Diagnosis not present

## 2015-11-04 DIAGNOSIS — M1991 Primary osteoarthritis, unspecified site: Secondary | ICD-10-CM | POA: Diagnosis not present

## 2015-11-04 DIAGNOSIS — I10 Essential (primary) hypertension: Secondary | ICD-10-CM | POA: Diagnosis not present

## 2015-11-09 DIAGNOSIS — L308 Other specified dermatitis: Secondary | ICD-10-CM | POA: Diagnosis not present

## 2015-11-13 ENCOUNTER — Encounter: Payer: Self-pay | Admitting: Nutrition

## 2015-11-13 ENCOUNTER — Encounter: Payer: Self-pay | Attending: Internal Medicine | Admitting: Nutrition

## 2015-11-13 VITALS — Ht 65.0 in | Wt 313.0 lb

## 2015-11-13 DIAGNOSIS — E1165 Type 2 diabetes mellitus with hyperglycemia: Secondary | ICD-10-CM

## 2015-11-13 DIAGNOSIS — IMO0002 Reserved for concepts with insufficient information to code with codable children: Secondary | ICD-10-CM

## 2015-11-13 DIAGNOSIS — Z794 Long term (current) use of insulin: Secondary | ICD-10-CM

## 2015-11-13 DIAGNOSIS — E669 Obesity, unspecified: Secondary | ICD-10-CM | POA: Insufficient documentation

## 2015-11-13 DIAGNOSIS — E118 Type 2 diabetes mellitus with unspecified complications: Secondary | ICD-10-CM

## 2015-11-13 NOTE — Progress Notes (Signed)
  Medical Nutrition Therapy:  Appt start time: 1100 end time:  1200. Assessment:  Primary concerns today: Diabetes Type 2. Most recent A1C 12%. Lantus 30 units and 1000 mg of Metformin BID. Has Sleep apnea. FBS in 200's.  She forgets a lot and is anemic and takes 3-325 mg of iron a day. Sees Dr. Laural Golden. Cirrhosis form Fatty LIver. Eats 2-3 meals per day. Diet is high in fat, sodium, simple sugars and processed foods. Sometimes forgets her insulin. No meal schedule. Sleeps in the am.  Not physically active due to weight, chronic fatigue and anemia. Craves and eats a lot of ice due to her anemia. Diet is low in fresh fruits, vegetables and whole grains.   Lab Results  Component Value Date   HGBA1C 6.5* 06/09/2014    Preferred Learning Style:   Auditory  Visual  Hands on  Learning Readiness:   Contemplating   MEDICATIONS: None   DIETARY INTAKE:  24-hr recall:  B ( AM):  2 pancakes or grils or eggs and toast or sandwich, Diet Dr [name] Snk ( AM): none  L ( PM): 2 slices pizza-mushroom and bacon,  Dt DR Pepper Snk ( PM):  D ( PM): Boiled shrimp cocktail, crackers-6 and Diet Dt Pepper Snk ( PM): none Beverages: Diet Soda, sweet tea  Usual physical activity: ADL   Estimated energy needs: 1800 calories 200 g carbohydrates 135 g protein 50 g fat  Progress Towards Goal(s):  In progress.   Nutritional Diagnosis:  NB-1.1 Food and nutrition-related knowledge deficit As related to Diabetes and morbid obesity.  As evidenced by A1C 12% and BMI >40.    Intervention:  Nutrition and Diabetes education provided on My Plate, CHO counting, meal planning, portion sizes, timing of meals, avoiding snacks between meals unless having a low blood sugar, target ranges for A1C and blood sugars, signs/symptoms and treatment of hyper/hypoglycemia, monitoring blood sugars, taking medications as prescribed, benefits of exercising 30 minutes per day and prevention of complications of DM. Low Sodium,  Low fat high fiber diet.  Goals 1. Follow MY Plate 2. Eat 2-3 carb choices per meal 3. Increase fresh fruits and whole grain and low carb vegetables. 4. Eat meals on time. 5. Cut out diet sodas and drink only water 6. Try to walk 15 minutes a day. 7.Take insulin daily as prescribed. 8. Get A1C down to 8% in three months 9. Lose 1-2 lbs per week. Test blood sugars before meals and bedtime and bring at next visit.  Teaching Method Utilized:  Visual Auditory Hands on  Handouts given during visit include:  The Plate Method   Meal Plan Card  Diabetes Instructions.   Barriers to learning/adherence to lifestyle change:  None  Demonstrated degree of understanding via:  Teach Back   Monitoring/Evaluation:  Dietary intake, exercise, Meal planning,SBG, and body weight in 1 week(s)  She would benefit from meal time insulin for better blood sugar control. Will reevaluate after her next visit and seeing her blood sugar readings. Recommend referral to an Endocrinologist to evaluate DM and thyroid/endocrine issues with her morbid obesity.

## 2015-11-18 NOTE — Patient Instructions (Signed)
Goals 1. Follow MY Plate 2. Eat 2-3 carb choices per meal 3. Increase fresh fruits and whole grain and low carb vegetables. 4. Eat meals on time. 5. Cut out diet sodas and drink only water 6. Try to walk 15 minutes a day. 7.Take insulin daily as prescribed. 8. Get A1C down to 8% in three months 9. Lose 1-2 lbs per week. Test blood sugars before meals and bedtime and bring at next visit.

## 2015-11-25 DIAGNOSIS — H2513 Age-related nuclear cataract, bilateral: Secondary | ICD-10-CM | POA: Diagnosis not present

## 2015-11-25 DIAGNOSIS — H538 Other visual disturbances: Secondary | ICD-10-CM | POA: Diagnosis not present

## 2015-11-27 ENCOUNTER — Encounter: Payer: PPO | Attending: Internal Medicine | Admitting: Nutrition

## 2015-11-27 ENCOUNTER — Encounter: Payer: Self-pay | Admitting: Nutrition

## 2015-11-27 VITALS — Wt 305.0 lb

## 2015-11-27 DIAGNOSIS — E118 Type 2 diabetes mellitus with unspecified complications: Secondary | ICD-10-CM

## 2015-11-27 DIAGNOSIS — E1165 Type 2 diabetes mellitus with hyperglycemia: Secondary | ICD-10-CM

## 2015-11-27 DIAGNOSIS — IMO0002 Reserved for concepts with insufficient information to code with codable children: Secondary | ICD-10-CM

## 2015-11-27 DIAGNOSIS — E669 Obesity, unspecified: Secondary | ICD-10-CM | POA: Insufficient documentation

## 2015-11-27 DIAGNOSIS — Z794 Long term (current) use of insulin: Secondary | ICD-10-CM

## 2015-11-27 NOTE — Progress Notes (Signed)
  Medical Nutrition Therapy:  Appt start time: 68159 end time:  1515 Assessment:  Primary concerns today: Diabetes Type 2.  Lost 8 lbs. Meter brought in 7 day avg 153 mg/dl,  14 day 157 mg/dl and 30 day 157 mg/dl. BS are much improved. She is eating much better balanced meals and eating more fresh fruits and vegetables. Drinking water. Not able to exercise due to chronic back and leg pain. Eating meals at better times.  She is up to 48 units of Lantus at night, Financial limitations limit her to access of more fresh fruits and vegetables. Diet is much improved and doing very well.    Lab Results  Component Value Date   HGBA1C 6.5* 06/09/2014    Preferred Learning Style:   Auditory  Visual  Hands on  Learning Readiness:   Contemplating   MEDICATIONS: None   DIETARY INTAKE:  24-hr recall:  B ( AM): Scrambled egg  With toast or oatmeal  Snk ( AM): none  L ( PM): Tomato sandwich, water Snk ( PM):  D ( PM): Green beans, beets, cabbage and creamed potato, corn muffin Snk ( PM): none Beverages: water, unsweet tam   Usual physical activity: ADL   Estimated energy needs: 1800 calories 200 g carbohydrates 135 g protein 50 g fat  Progress Towards Goal(s):  In progress.   Nutritional Diagnosis:  NB-1.1 Food and nutrition-related knowledge deficit As related to Diabetes and morbid obesity.  As evidenced by A1C 12% and BMI >40.    Intervention:  Nutrition and Diabetes education provided on My Plate, CHO counting, meal planning, portion sizes, timing of meals, avoiding snacks between meals unless having a low blood sugar, target ranges for A1C and blood sugars, signs/symptoms and treatment of hyper/hypoglycemia, monitoring blood sugars, taking medications as prescribed, benefits of exercising 30 minutes per day and prevention of complications of DM. Low Sodium, Low fat high fiber diet.  Goals 1. Follow MY Plate 2. Eat 2-3 carb choices per meal 3. Increase fresh fruits and  whole grain and low carb vegetables. 4. Get rid of diet sodas. 5. . Try to walk 15 minutes a day. 7.Take insulin daily as prescribed. 8. Get A1C down to 7% in three months 9. Lose 1-2 lbs per week. Test blood sugars before meals and bedtime and bring at next visit.  Teaching Method Utilized:  Visual Auditory Hands on  Handouts given during visit include:  The Plate Method   Meal Plan Card  Diabetes Instructions.   Barriers to learning/adherence to lifestyle change:  None  Demonstrated degree of understanding via:  Teach Back   Monitoring/Evaluation:  Dietary intake, exercise, Meal planning,SBG, and body weight in 2-3 months

## 2015-11-27 NOTE — Patient Instructions (Signed)
Goals 1. Follow MY Plate 2. Eat 2-3 carb choices per meal 3. Increase fresh fruits and whole grain and low carb vegetables. 4. Get rid of diet sodas. 5. . Try to walk 15 minutes a day. 7.Take insulin daily as prescribed. 8. Get A1C down to 7% in three months 9. Lose 1-2 lbs per week. Test blood sugars before meals and bedtime and bring at next visit.

## 2015-12-09 ENCOUNTER — Ambulatory Visit (INDEPENDENT_AMBULATORY_CARE_PROVIDER_SITE_OTHER): Payer: PPO | Admitting: Podiatry

## 2015-12-09 ENCOUNTER — Encounter: Payer: Self-pay | Admitting: Podiatry

## 2015-12-09 VITALS — BP 142/86 | HR 89 | Resp 12

## 2015-12-09 DIAGNOSIS — E119 Type 2 diabetes mellitus without complications: Secondary | ICD-10-CM

## 2015-12-09 DIAGNOSIS — B353 Tinea pedis: Secondary | ICD-10-CM

## 2015-12-09 DIAGNOSIS — L853 Xerosis cutis: Secondary | ICD-10-CM

## 2015-12-09 MED ORDER — ECONAZOLE NITRATE 1 % EX CREA: TOPICAL_CREAM | Freq: Every day | CUTANEOUS | Status: AC

## 2015-12-09 NOTE — Progress Notes (Signed)
   Subjective:    Patient ID: [name] [name], female    DOB: September 21, 1955, 60 y.o.   MRN: [id number]  HPI    This patient presents today complaining of a 3 year history of chronic redness and dryness in the skin on her right foot i without any  Progression.. She applies skin moisturizing lotion and Vaseline i on a regular basis without any reduction of the the redness and dryness .  Patient is a type II diabetic and denies any history of amputation, claudication or foot ulceration  Review of Systems  HENT: Positive for sinus pressure.   Musculoskeletal: Positive for myalgias, back pain and gait problem.       Objective:   Physical Exam  Patient's granddaughter present in the treatment room Patient orientated 3  Vascular: No calf edema or calf tenderness bilaterally DP and PT pulses 2/4 bilaterally Capillary reflex immediate bilaterally  Neurological: Sensation to 10 g monofilament wire intact 4/5 bilaterally Vibratory sensation reactive bilaterally Ankle reflex equal and reactive bilaterally  Dermatological: No open skin lesions bilaterally Dry scaling skin right greater than left Generalized redness and dry skin dorsal right toes Dry callusing medial right heel  Musculoskeletal: Prominent posterior left heel with overlying callus Hammertoe second bilaterally       Assessment & Plan:   Assessment: Type II diabetic Satisfactory neurovascular status Xerosis bilaterally Tinea pedis right  Plan: Today I reviewed the results of examination with patient and granddaughter. Chronic redness in the skin on the right foot is combination of tinea and dry skin Rx Eiconazole 1% cream dispensed 85 g sig apply once daily to skin on feet 30 days Also recommended all-purpose skin moisturizer to be applied after the application of antifungal cream Recommended the daily use of a callus file to use gently sitting in the callus on her heel area  Reappoint at patient's request  or yearly

## 2015-12-09 NOTE — Patient Instructions (Signed)
Apply the antifungal cream once daily 30 days Use all purpose skin lotion on your right and left t only after applying the topical antifungal cream Use a pumice stone on his status and a callus on your feet after showering   Diabetes and Foot Care Diabetes may cause you to have problems because of poor blood supply (circulation) to your feet and legs. This may cause the skin on your feet to become thinner, break easier, and heal more slowly. Your skin may become dry, and the skin may peel and crack. You may also have nerve damage in your legs and feet causing decreased feeling in them. You may not notice minor injuries to your feet that could lead to infections or more serious problems. Taking care of your feet is one of the most important things you can do for yourself.  HOME CARE INSTRUCTIONS  Wear shoes at all times, even in the house. Do not go barefoot. Bare feet are easily injured.  Check your feet daily for blisters, cuts, and redness. If you cannot see the bottom of your feet, use a mirror or ask someone for help.  Wash your feet with warm water (do not use hot water) and mild soap. Then pat your feet and the areas between your toes until they are completely dry. Do not soak your feet as this can dry your skin.  Apply a moisturizing lotion or petroleum jelly (that does not contain alcohol and is unscented) to the skin on your feet and to dry, brittle toenails. Do not apply lotion between your toes.  Trim your toenails straight across. Do not dig under them or around the cuticle. File the edges of your nails with an emery board or nail file.  Do not cut corns or calluses or try to remove them with medicine.  Wear clean socks or stockings every day. Make sure they are not too tight. Do not wear knee-high stockings since they may decrease blood flow to your legs.  Wear shoes that fit properly and have enough cushioning. To break in new shoes, wear them for just a few hours a day. This  prevents you from injuring your feet. Always look in your shoes before you put them on to be sure there are no objects inside.  Do not cross your legs. This may decrease the blood flow to your feet.  If you find a minor scrape, cut, or break in the skin on your feet, keep it and the skin around it clean and dry. These areas may be cleansed with mild soap and water. Do not cleanse the area with peroxide, alcohol, or iodine.  When you remove an adhesive bandage, be sure not to damage the skin around it.  If you have a wound, look at it several times a day to make sure it is healing.  Do not use heating pads or hot water bottles. They may burn your skin. If you have lost feeling in your feet or legs, you may not know it is happening until it is too late.  Make sure your health care provider performs a complete foot exam at least annually or more often if you have foot problems. Report any cuts, sores, or bruises to your health care provider immediately. SEEK MEDICAL CARE IF:   You have an injury that is not healing.  You have cuts or breaks in the skin.  You have an ingrown nail.  You notice redness on your legs or feet.  You feel  burning or tingling in your legs or feet.  You have pain or cramps in your legs and feet.  Your legs or feet are numb.  Your feet always feel cold. SEEK IMMEDIATE MEDICAL CARE IF:   There is increasing redness, swelling, or pain in or around a wound.  There is a red line that goes up your leg.  Pus is coming from a wound.  You develop a fever or as directed by your health care provider.  You notice a bad smell coming from an ulcer or wound.   This information is not intended to replace advice given to you by your health care provider. Make sure you discuss any questions you have with your health care provider.   Document Released: 06/03/2000 Document Revised: 02/06/2013 Document Reviewed: 11/13/2012 Elsevier Interactive Patient Education [employer]

## 2016-02-19 DIAGNOSIS — Z1389 Encounter for screening for other disorder: Secondary | ICD-10-CM | POA: Diagnosis not present

## 2016-02-19 DIAGNOSIS — E538 Deficiency of other specified B group vitamins: Secondary | ICD-10-CM | POA: Diagnosis not present

## 2016-02-19 DIAGNOSIS — G894 Chronic pain syndrome: Secondary | ICD-10-CM | POA: Diagnosis not present

## 2016-02-19 DIAGNOSIS — H01005 Unspecified blepharitis left lower eyelid: Secondary | ICD-10-CM | POA: Diagnosis not present

## 2016-02-19 DIAGNOSIS — E782 Mixed hyperlipidemia: Secondary | ICD-10-CM | POA: Diagnosis not present

## 2016-02-19 DIAGNOSIS — Z6841 Body Mass Index (BMI) 40.0 and over, adult: Secondary | ICD-10-CM | POA: Diagnosis not present

## 2016-02-29 ENCOUNTER — Encounter: Payer: PPO | Attending: Internal Medicine | Admitting: Nutrition

## 2016-02-29 ENCOUNTER — Encounter: Payer: Self-pay | Admitting: Nutrition

## 2016-02-29 DIAGNOSIS — Z6841 Body Mass Index (BMI) 40.0 and over, adult: Secondary | ICD-10-CM | POA: Insufficient documentation

## 2016-02-29 DIAGNOSIS — E669 Obesity, unspecified: Secondary | ICD-10-CM | POA: Diagnosis not present

## 2016-02-29 DIAGNOSIS — E119 Type 2 diabetes mellitus without complications: Secondary | ICD-10-CM

## 2016-02-29 NOTE — Progress Notes (Signed)
  Medical Nutrition Therapy:  Appt start time: 05110 end time:  1515 Assessment:  Primary concerns today: Diabetes Type 2. Colman Cater to see. Dr. Gerarda Fraction  this week for lab work. Has been dealing with depression. Lost 1 lb since last visit.  Changes: Has cut back on snacks around the home. Trying to eat more fruit. Has been depressed a lot lately. Still taking care of her aging mother.    Not drinking water 48 units of Lantus at night and 2 g of Metformin daily.   Due to obesity, not able to walk much but in her house.  Tends to snack during day while watching TV.   Diet is low in fresh fruits, vegetables and whole grains. Recommend to refer to a therapist to help with depression.    Lab Results  Component Value Date   HGBA1C 6.5 (H) 06/09/2014    Preferred Learning Style:   Auditory  Visual  Hands on  Learning Readiness:   Contemplating   MEDICATIONS: None   DIETARY INTAKE:  24-hr recall:  B ( AM):  Cheese sandwich or Rice krispies or scrambled egg,  Snk ( AM): none  L ( PM): Hamburger with bun, Sweet tea Snk ( PM):  D ( PM): Hawaiian chicken, rice 1-2c, Diet Dr. [name] Snk ( PM):  Beverages: water, unsweet tam   Usual physical activity: ADL   Estimated energy needs: 1800 calories 200 g carbohydrates 135 g protein 50 g fat  Progress Towards Goal(s):  In progress.   Nutritional Diagnosis:  NB-1.1 Food and nutrition-related knowledge deficit As related to Diabetes and morbid obesity.  As evidenced by A1C 12% and BMI >40.    Intervention:  Nutrition and Diabetes education provided on My Plate, CHO counting, meal planning, portion sizes, timing of meals, avoiding snacks between meals unless having a low blood sugar, target ranges for A1C and blood sugars, signs/symptoms and treatment of hyper/hypoglycemia, monitoring blood sugars, taking medications as prescribed, benefits of exercising 30 minutes per day and prevention of complications of DM. Low Sodium, Low fat high  fiber diet.  Goals 1. Increase to eating 2 servings of vegetables per day 2. Cut out soda and  Diet soda 3. Drink 3-4 bottles of water.  4. Walk 15 minutes a day. 5. Lose 2-3 lb per month 6. Get A1C less than 6%. Talk to Dr. Gerarda Fraction about checking Vit D levels. Discuss issues with Depression and ask for referral for therapist.  Teaching Method Utilized:  Visual Auditory Hands on  Handouts given during visit include:  The Plate Method   Meal Plan Card  Diabetes Instructions.   Barriers to learning/adherence to lifestyle change:  None  Demonstrated degree of understanding via:  Teach Back   Monitoring/Evaluation:  Dietary intake, exercise, Meal planning,SBG, and body weight in 2-3 months

## 2016-02-29 NOTE — Patient Instructions (Addendum)
Goals 1. Increase to eating 2 servings of vegetables per day 2. Cut out soda and  Diet soda 3. Drink 3-4 bottles of water.  4. Walk 15 minutes a day. 5. Lose 2-3 lb per month 6. Get A1C less than 6%. Talk to Dr. Gerarda Fraction about checking Vit D levels. Discuss issues with Depression and ask for referral for therapist.

## 2016-03-08 DIAGNOSIS — E119 Type 2 diabetes mellitus without complications: Secondary | ICD-10-CM | POA: Diagnosis not present

## 2016-03-08 DIAGNOSIS — R5383 Other fatigue: Secondary | ICD-10-CM | POA: Diagnosis not present

## 2016-03-08 DIAGNOSIS — Z1389 Encounter for screening for other disorder: Secondary | ICD-10-CM | POA: Diagnosis not present

## 2016-03-08 DIAGNOSIS — L84 Corns and callosities: Secondary | ICD-10-CM | POA: Diagnosis not present

## 2016-03-08 DIAGNOSIS — R201 Hypoesthesia of skin: Secondary | ICD-10-CM | POA: Diagnosis not present

## 2016-03-08 DIAGNOSIS — L209 Atopic dermatitis, unspecified: Secondary | ICD-10-CM | POA: Diagnosis not present

## 2016-03-08 DIAGNOSIS — E559 Vitamin D deficiency, unspecified: Secondary | ICD-10-CM | POA: Diagnosis not present

## 2016-03-08 DIAGNOSIS — D649 Anemia, unspecified: Secondary | ICD-10-CM | POA: Diagnosis not present

## 2016-03-08 DIAGNOSIS — I471 Supraventricular tachycardia: Secondary | ICD-10-CM | POA: Diagnosis not present

## 2016-03-08 DIAGNOSIS — I10 Essential (primary) hypertension: Secondary | ICD-10-CM | POA: Diagnosis not present

## 2016-03-08 DIAGNOSIS — Z6841 Body Mass Index (BMI) 40.0 and over, adult: Secondary | ICD-10-CM | POA: Diagnosis not present

## 2016-03-10 ENCOUNTER — Telehealth: Payer: Self-pay | Admitting: Nutrition

## 2016-03-10 ENCOUNTER — Encounter: Payer: Self-pay | Admitting: Nutrition

## 2016-03-10 NOTE — Telephone Encounter (Signed)
She called to say her A1C came down from 12% down to 7% and she lost 2-3 lbs. Her PCP is putting her on iron pills 3 (625 mg) per dday day and added Vitamin D 2000 mg once a day for her low Vit D levels. FBS are in the 150's and HS blood sugars about 120 mg/dl. Still taking 48 units of Lantus. Congratulated her and she will see me in Dec 2017.

## 2016-04-16 ENCOUNTER — Other Ambulatory Visit (INDEPENDENT_AMBULATORY_CARE_PROVIDER_SITE_OTHER): Payer: Self-pay | Admitting: Internal Medicine

## 2016-04-20 DIAGNOSIS — Z23 Encounter for immunization: Secondary | ICD-10-CM | POA: Diagnosis not present

## 2016-04-20 DIAGNOSIS — D510 Vitamin B12 deficiency anemia due to intrinsic factor deficiency: Secondary | ICD-10-CM | POA: Diagnosis not present

## 2016-04-26 ENCOUNTER — Encounter (INDEPENDENT_AMBULATORY_CARE_PROVIDER_SITE_OTHER): Payer: Self-pay | Admitting: Internal Medicine

## 2016-04-26 ENCOUNTER — Ambulatory Visit (INDEPENDENT_AMBULATORY_CARE_PROVIDER_SITE_OTHER): Payer: PPO | Admitting: Internal Medicine

## 2016-04-26 VITALS — BP 134/80 | HR 78 | Temp 98.4°F | Resp 18 | Ht 65.0 in | Wt 310.2 lb

## 2016-04-26 DIAGNOSIS — K219 Gastro-esophageal reflux disease without esophagitis: Secondary | ICD-10-CM

## 2016-04-26 DIAGNOSIS — K7460 Unspecified cirrhosis of liver: Secondary | ICD-10-CM

## 2016-04-26 NOTE — Patient Instructions (Signed)
Ultrasound of the liver to be scheduled. Physician will call with results of blood tests when completed

## 2016-04-26 NOTE — Progress Notes (Signed)
Presenting complaint;  Follow-up for cirrhosis  Database and Subjective:  Patient is 60 year old Caucasian female who is cirrhosis secondary to NASH, chronic GERD and anemia who was last evaluated with EGD ED and colonoscopy on 04/17/2015. EGD revealed mild portal gastropathy erosive antral gastritis. Esophagus was dilated by passing 56 French Maloney dilator but no mucosal interruption noted. Anoscopy revealed small lipoma at hepatic flexure and 8 mm polyp was removed from splenic flexure and it turned out to be inflammatory. H. pylori serology was negative. She returned for follow-up on 10/27/2015 when she weighed 314 pounds.  Patient states she has not been able to walk or exercise on account of knee and back pain. She is trying to wash intake. She has lost 4 pounds. She has seen Ms. Crumpton, dietitian twice in order to help her total calories and lose weight. She denies nausea vomiting heartburn or dysphagia. Bowels move daily and RV other day. She takes Elavil to help her sleep. She is under lot in which she takes occasionally if she has severe knee or back pain. He states she had blood work by Dr. Gerarda Fraction in her hemoglobin was still low. He asked her to stay on iron.     Current Medications: Outpatient Encounter Prescriptions as of 04/26/2016  Medication Sig  . albuterol (PROVENTIL HFA) 108 (90 BASE) MCG/ACT inhaler Inhale 2 puffs into the lungs every 6 (six) hours as needed for wheezing or shortness of breath. Shortness of breath  . ALPRAZolam (XANAX) 0.5 MG tablet Take 0.5 mg by mouth as needed for anxiety.  [name] amitriptyline (ELAVIL) 10 MG tablet Take 10 mg by mouth as needed.   . colesevelam (WELCHOL) 625 MG tablet Take 1,875 mg by mouth 2 (two) times daily with a meal. 3 tabs am 3 tabs pm  . Cyanocobalamin (B-12 COMPLIANCE INJECTION) 1000 MCG/ML KIT Inject as directed every 30 (thirty) days.   [name] diltiazem (CARDIZEM CD) 300 MG 24 hr capsule Take 1 capsule (300 mg total) by mouth  daily.  . DULoxetine (CYMBALTA) 60 MG capsule Take 90 mg by mouth daily.   [name] econazole nitrate 1 % cream Apply topically daily.  . ferrous sulfate 325 (65 FE) MG tablet Take 325 mg by mouth 3 (three) times daily with meals.  . furosemide (LASIX) 20 MG tablet Take 20 mg by mouth daily.  [name] HYDROmorphone (DILAUDID) 4 MG tablet Take 4 mg by mouth every 6 (six) hours as needed for severe pain.   [name] insulin glargine (LANTUS SOLOSTAR) 100 UNIT/ML injection Inject 48 Units into the skin at bedtime.   [name] lisinopril (PRINIVIL,ZESTRIL) 10 MG tablet Take 10 mg by mouth daily.   . meloxicam (MOBIC) 15 MG tablet Take 15 mg by mouth daily.   . metFORMIN (GLUCOPHAGE) 1000 MG tablet Take 1,000 mg by mouth 2 (two) times daily with a meal.  . methocarbamol (ROBAXIN) 500 MG tablet Take 500 mg by mouth 3 (three) times daily.   [name] nystatin (NYSTOP) 100000 UNIT/GM POWD Apply 100,000 g topically daily. Rash  . pantoprazole (PROTONIX) 40 MG tablet TAKE ONE (1) TABLET EACH DAY BEFORE BREAKFAST  . SYNTHROID 300 MCG tablet Take 300 mcg by mouth daily before breakfast.   . tolterodine (DETROL) 2 MG tablet Take 2 mg by mouth 2 (two) times daily.   . VOLTAREN 1 % GEL Apply 4 g topically daily as needed (pain). For pain  . [DISCONTINUED] acetaminophen (TYLENOL) 500 MG tablet Take 1,000 mg by mouth every 6 (six) hours as needed (  pain).  . [DISCONTINUED] liothyronine (CYTOMEL) 5 MCG tablet Take 5 mcg by mouth daily.   No facility-administered encounter medications on file as of 04/26/2016.      Objective: Blood pressure 134/80, pulse 78, temperature 98.4 F (36.9 C), temperature source Oral, resp. rate 18, height _0  (1.651 m), weight (!) 310 lb 3.2 oz (140.7 kg). Patient is alert and in no acute distress. She does not have asterixis. Conjunctiva is pink. Sclera is nonicteric Oropharyngeal mucosa is normal. No neck masses or thyromegaly noted. Cardiac exam with regular rhythm normal S1 and S2. No murmur or gallop  noted. Lungs are clear to auscultation. Abdomen is obese with few scabs(scratch marks). Abdomen is soft and nontender and is difficult to palpate liver or spleen. No LE edema or clubbing noted.  Labs/studies Results: AFP was 3.7 on 10/27/2015   last ultrasound was on 11/03/2015 and negative for focal lesion in the liver.  Assessment:  #1. Cirrhosis secondary to NASH. She is at significant risk for progressive disease given morbid obesity and very limited physical activity. EGD last year was negative for esophageal varices. She is due for [facility] screening. #2. GERD. Heartburn well controlled with therapy. #3. Anemia possibly due to chronic disease. We will requests copy of blood work from Dr. Delice Bison office.   Plan:  We will requests copy of recent blood work. Patient will go the left are AFP. Hepatic ultrasound. OV in 6 months.

## 2016-04-27 ENCOUNTER — Other Ambulatory Visit (INDEPENDENT_AMBULATORY_CARE_PROVIDER_SITE_OTHER): Payer: Self-pay | Admitting: Internal Medicine

## 2016-04-27 DIAGNOSIS — K7469 Other cirrhosis of liver: Secondary | ICD-10-CM

## 2016-04-29 ENCOUNTER — Encounter (INDEPENDENT_AMBULATORY_CARE_PROVIDER_SITE_OTHER): Payer: Self-pay

## 2016-05-03 ENCOUNTER — Ambulatory Visit (HOSPITAL_COMMUNITY)
Admission: RE | Admit: 2016-05-03 | Discharge: 2016-05-03 | Disposition: A | Discharge status: Home / Self Care | Payer: PPO | Source: Ambulatory Visit | Attending: Internal Medicine | Admitting: Internal Medicine

## 2016-05-03 DIAGNOSIS — K766 Portal hypertension: Secondary | ICD-10-CM | POA: Diagnosis not present

## 2016-05-03 DIAGNOSIS — K7469 Other cirrhosis of liver: Secondary | ICD-10-CM | POA: Diagnosis not present

## 2016-05-03 DIAGNOSIS — K7460 Unspecified cirrhosis of liver: Secondary | ICD-10-CM | POA: Diagnosis not present

## 2016-05-03 DIAGNOSIS — R161 Splenomegaly, not elsewhere classified: Secondary | ICD-10-CM | POA: Insufficient documentation

## 2016-05-04 LAB — AFP TUMOR MARKER: AFP TUMOR MARKER: 3.6 ng/mL (ref <6.1)

## 2016-05-24 DIAGNOSIS — M1991 Primary osteoarthritis, unspecified site: Secondary | ICD-10-CM | POA: Diagnosis not present

## 2016-05-24 DIAGNOSIS — E782 Mixed hyperlipidemia: Secondary | ICD-10-CM | POA: Diagnosis not present

## 2016-05-24 DIAGNOSIS — W19XXXA Unspecified fall, initial encounter: Secondary | ICD-10-CM | POA: Diagnosis not present

## 2016-05-24 DIAGNOSIS — Z6841 Body Mass Index (BMI) 40.0 and over, adult: Secondary | ICD-10-CM | POA: Diagnosis not present

## 2016-05-24 DIAGNOSIS — N342 Other urethritis: Secondary | ICD-10-CM | POA: Diagnosis not present

## 2016-05-24 DIAGNOSIS — Z1389 Encounter for screening for other disorder: Secondary | ICD-10-CM | POA: Diagnosis not present

## 2016-05-24 DIAGNOSIS — E119 Type 2 diabetes mellitus without complications: Secondary | ICD-10-CM | POA: Diagnosis not present

## 2016-05-30 ENCOUNTER — Ambulatory Visit: Payer: PPO | Admitting: Nutrition

## 2016-06-29 DIAGNOSIS — R5383 Other fatigue: Secondary | ICD-10-CM | POA: Diagnosis not present

## 2016-07-29 DIAGNOSIS — E611 Iron deficiency: Secondary | ICD-10-CM | POA: Diagnosis not present

## 2016-08-11 DIAGNOSIS — Z1389 Encounter for screening for other disorder: Secondary | ICD-10-CM | POA: Diagnosis not present

## 2016-08-11 DIAGNOSIS — M1991 Primary osteoarthritis, unspecified site: Secondary | ICD-10-CM | POA: Diagnosis not present

## 2016-08-11 DIAGNOSIS — E119 Type 2 diabetes mellitus without complications: Secondary | ICD-10-CM | POA: Diagnosis not present

## 2016-08-11 DIAGNOSIS — M7121 Synovial cyst of popliteal space [Baker], right knee: Secondary | ICD-10-CM | POA: Diagnosis not present

## 2016-08-11 DIAGNOSIS — Z6841 Body Mass Index (BMI) 40.0 and over, adult: Secondary | ICD-10-CM | POA: Diagnosis not present

## 2016-08-11 DIAGNOSIS — F329 Major depressive disorder, single episode, unspecified: Secondary | ICD-10-CM | POA: Diagnosis not present

## 2016-09-16 ENCOUNTER — Other Ambulatory Visit: Payer: Self-pay | Admitting: Cardiology

## 2016-09-19 DIAGNOSIS — E063 Autoimmune thyroiditis: Secondary | ICD-10-CM | POA: Diagnosis not present

## 2016-09-19 DIAGNOSIS — M2550 Pain in unspecified joint: Secondary | ICD-10-CM | POA: Diagnosis not present

## 2016-09-19 DIAGNOSIS — E538 Deficiency of other specified B group vitamins: Secondary | ICD-10-CM | POA: Diagnosis not present

## 2016-09-19 DIAGNOSIS — L309 Dermatitis, unspecified: Secondary | ICD-10-CM | POA: Diagnosis not present

## 2016-09-19 DIAGNOSIS — E669 Obesity, unspecified: Secondary | ICD-10-CM | POA: Diagnosis not present

## 2016-09-19 DIAGNOSIS — E1140 Type 2 diabetes mellitus with diabetic neuropathy, unspecified: Secondary | ICD-10-CM | POA: Diagnosis not present

## 2016-09-19 DIAGNOSIS — Z6841 Body Mass Index (BMI) 40.0 and over, adult: Secondary | ICD-10-CM | POA: Diagnosis not present

## 2016-09-19 DIAGNOSIS — Z0001 Encounter for general adult medical examination with abnormal findings: Secondary | ICD-10-CM | POA: Diagnosis not present

## 2016-10-06 ENCOUNTER — Ambulatory Visit: Payer: PPO | Admitting: Cardiology

## 2016-10-24 ENCOUNTER — Ambulatory Visit (INDEPENDENT_AMBULATORY_CARE_PROVIDER_SITE_OTHER): Payer: PPO | Admitting: Cardiology

## 2016-10-24 ENCOUNTER — Encounter: Payer: Self-pay | Admitting: Cardiology

## 2016-10-24 VITALS — BP 140/90 | HR 103 | Ht 65.0 in | Wt 305.2 lb

## 2016-10-24 DIAGNOSIS — I2510 Atherosclerotic heart disease of native coronary artery without angina pectoris: Secondary | ICD-10-CM

## 2016-10-24 DIAGNOSIS — I10 Essential (primary) hypertension: Secondary | ICD-10-CM

## 2016-10-24 DIAGNOSIS — R002 Palpitations: Secondary | ICD-10-CM | POA: Diagnosis not present

## 2016-10-24 NOTE — Patient Instructions (Signed)
Your physician wants you to follow-up in: 1 year You will receive a reminder letter in the mail two months in advance. If you don't receive a letter, please call our office to schedule the follow-up appointment.   Your physician recommends that you continue on your current medications as directed. Please refer to the Current Medication list given to you today.    If you need a refill on your cardiac medications before your next appointment, please call your pharmacy.      Thank you for choosing [facility] !        

## 2016-10-24 NOTE — Progress Notes (Signed)
Cardiology Office Note  Date: 10/24/2016   ID: [name], [name] 01-04-1956, MRN [id number]  PCP: Redmond School, MD  Primary Cardiologist: Rozann Lesches, MD   Chief Complaint  Patient presents with  . Follow-up palpitations    History of Present Illness: [name] [name] is a 61 y.o. female last seen in May 2017. She presents for a routine follow-up visit. Reports a recent episode of "heat exhaustion," has trouble with shortness of breath particularly in the high heat and humidity. No reproducible exertional chest pain. Occasional sense of palpitations, but she does have a relatively high resting heart rate at baseline. She continues to follow with Dr. Gerarda Fraction.  I personally reviewed her ECG today which shows sinus tachycardia, otherwise normal. She is on Cartia XT for treatment of hypertension and heart rate suppression with history of palpitations.  Past Medical History:  Diagnosis Date  . Anemia   . Anxiety   . Arthritis   . Asthma   . Cirrhosis of liver ([name])   . COPD (chronic obstructive pulmonary disease) ([name])   . Depression   . Edema   . Essential hypertension, benign   . GERD (gastroesophageal reflux disease)   . Hypothyroidism   . Morbid obesity ([name])   . Sleep apnea    CPAP - not consistent  . Thyroid cancer ([name])   . Type 2 diabetes mellitus ([name])     Past Surgical History:  Procedure Laterality Date  . "pump bumps"     bilateral heels--2000  . ABDOMINAL HYSTERECTOMY  1985  . CARDIAC CATHETERIZATION  2012   Dr. Lia Foyer told her nothing was wrong.  [name] [name]  . CHOLECYSTECTOMY  1996  . COLONOSCOPY WITH PROPOFOL N/A 04/17/2015   Procedure: COLONOSCOPY WITH PROPOFOL  at cecum at 0810; withdrawal time =15 minutes;  Surgeon: [name], MD;  [facility];  Service: Endoscopy;  Laterality: N/A;  . Debridement of abdominal wound  2011  . ESOPHAGEAL DILATION N/A 04/17/2015   Procedure: ESOPHAGEAL DILATION WITH 56FR MALONEY DILATOR;   Surgeon: [name], MD;  [facility];  Service: Endoscopy;  Laterality: N/A;  . ESOPHAGOGASTRODUODENOSCOPY (EGD) WITH PROPOFOL N/A 04/17/2015   Procedure: ESOPHAGOGASTRODUODENOSCOPY (EGD) WITH PROPOFOL;  Surgeon: [name], MD;  [facility];  Service: Endoscopy;  Laterality: N/A;  . EXPLORATORY LAPAROTOMY  2003  . FOOT SURGERY Bilateral 2000   Bone spur removed  . INCISIONAL HERNIA REPAIR N/A 06/09/2014   Procedure: RECURRENT  INCISIONAL HERNIORRHAPHY WITH MESH;  Surgeon: [name], MD;  [facility]: [facility];  Service: General;  Laterality: N/A;  . INCISIONAL HERNIA REPAIR N/A 10/15/2014   Procedure: [place];  Surgeon: [name], MD;  Location: [name];  Service: General;  Laterality: N/A;  . INSERTION OF MESH N/A 06/09/2014   Procedure: INSERTION OF MESH;  Surgeon: [name], MD;  [facility];  Service: General;  Laterality: N/A;  . INSERTION OF MESH N/A 10/15/2014   Procedure: INSERTION OF MESH;  Surgeon: [name], MD;  Location: [facility];  Service: General;  Laterality: N/A;  . POLYPECTOMY  04/17/2015   Procedure: POLYPECTOMY;  Surgeon: [name], MD;  [facility];  Service: Endoscopy;;  . RESECTION DISTAL CLAVICAL  05/07/2012   Procedure: RESECTION DISTAL CLAVICAL;  Surgeon: [name], MD;  [facility];  Service: Orthopedics;  Laterality: Right;  . SHOULDER OPEN ROTATOR CUFF REPAIR  05/07/2012   Procedure:  ROTATOR CUFF REPAIR SHOULDER OPEN;  Surgeon: [name], MD;  [facility];  Service: Orthopedics;  Laterality: Right;  . THYROIDECTOMY  2009  . TONSILLECTOMY  1958  . UMBILICAL HERNIA REPAIR  2010    Current Outpatient Prescriptions  Medication Sig Dispense Refill  . albuterol (PROVENTIL HFA) 108 (90 BASE) MCG/ACT inhaler Inhale 2 puffs into the lungs every 6 (six) hours as needed for wheezing or shortness of breath. Shortness of breath    . ALPRAZolam (XANAX) 0.5 MG tablet  Take 0.5 mg by mouth as needed for anxiety.    [name] Kitchen amitriptyline (ELAVIL) 10 MG tablet Take 10 mg by mouth as needed.     [name] Kitchen CARTIA XT 300 MG 24 hr capsule TAKE ONE (1) CAPSULE EACH DAY 90 capsule 1  . colesevelam (WELCHOL) 625 MG tablet Take 1,875 mg by mouth 2 (two) times daily with a meal. 3 tabs am 3 tabs pm    . Cyanocobalamin (B-12 COMPLIANCE INJECTION) 1000 MCG/ML KIT Inject as directed every 30 (thirty) days.     . DULoxetine (CYMBALTA) 60 MG capsule Take 90 mg by mouth daily.   4  . econazole nitrate 1 % cream Apply topically daily. 85 g 0  . ferrous sulfate 325 (65 FE) MG tablet Take 325 mg by mouth 3 (three) times daily with meals.    . furosemide (LASIX) 20 MG tablet Take 20 mg by mouth daily.    . hydrochlorothiazide (HYDRODIURIL) 25 MG tablet     . HYDROmorphone (DILAUDID) 4 MG tablet Take 4 mg by mouth every 6 (six) hours as needed for severe pain.   0  . hydrOXYzine (VISTARIL) 25 MG capsule Take 25 mg by mouth 3 (three) times daily as needed.    . insulin glargine (LANTUS SOLOSTAR) 100 UNIT/ML injection Inject 48 Units into the skin at bedtime.     [name] Kitchen lisinopril (PRINIVIL,ZESTRIL) 10 MG tablet Take 10 mg by mouth daily.     . meloxicam (MOBIC) 15 MG tablet Take 15 mg by mouth daily.     . metFORMIN (GLUCOPHAGE) 1000 MG tablet Take 1,000 mg by mouth 2 (two) times daily with a meal.    . methocarbamol (ROBAXIN) 500 MG tablet Take 500 mg by mouth 3 (three) times daily.     [name] Kitchen nystatin (NYSTOP) 100000 UNIT/GM POWD Apply 100,000 g topically daily. Rash    . pantoprazole (PROTONIX) 40 MG tablet TAKE ONE (1) TABLET EACH DAY BEFORE BREAKFAST 30 tablet 5  . SYNTHROID 300 MCG tablet Take 300 mcg by mouth daily before breakfast.   5  . tolterodine (DETROL) 2 MG tablet Take 2 mg by mouth 2 (two) times daily.     . VOLTAREN 1 % GEL Apply 4 g topically daily as needed (pain). For pain     No current facility-administered medications for this visit.    Allergies:  Doxycycline; Adhesive [tape];  Biaxin [clarithromycin]; Percocet [oxycodone-acetaminophen]; Clarithromycin; Clindamycin/lincomycin; Neosporin [neomycin-polymyxin b gu]; and Penicillins   Social History: The patient  reports that she has never smoked. She has never used smokeless tobacco. She reports that she does not drink alcohol or use drugs.   ROS:  Please see the history of present illness. Otherwise, complete review of systems is positive for none.  All other systems are reviewed and negative.   Physical Exam: VS:  BP 140/90   Pulse (!) 103   Ht _0  (1.651 m)   Wt (!) 305 lb 3.2 oz (138.4 kg)  BMI 50.79 kg/m , BMI Body mass index is 50.79 kg/m.  Wt Readings from Last 3 Encounters:  10/24/16 (!) 305 lb 3.2 oz (138.4 kg)  04/26/16 (!) 310 lb 3.2 oz (140.7 kg)  02/29/16 (!) 304 lb (137.9 kg)    Morbidly obese woman in no acute distress.  HEENT: Conjunctiva and lids normal, oropharynx clear.  Neck: No obvious elevated JVP or bruits, no thyromegaly.  Lungs: Clear auscultation, nonlabored, no wheezing.  Cardiac: Distant regular heart sounds, no significant murmur or gallop.  Abdomen: Morbidly obese with pannus, bowel sounds present, no tenderness or guarding.  Skin: Warm and dry.  ECG: I personally reviewed the tracing from 10/28/2015 which showed sinus tachycardia with PACs, nonspecific ST changes.  Recent Labwork:  September 2017: TSH 0.03 October 2015: Hemoglobin 9.5, platelets 197, potassium 4.2, BUN 9, creatinine 0.7, AST 17, ALT 19  Other Studies Reviewed Today:  Cardiac catheterization 10/22/2010: HEMODYNAMIC DATA: 1. The central aortic pressure was 140/70, mean 90. 2. LV pressure 141/20. 3. There was no gradient or pullback across aortic valve.  ANGIOGRAPHIC DATA: 1. Ventriculography is done in the RAO projection.  Overall systolic     function is preserved.  No segmental abnormalities contraction were     identified. 2. There is minor calcification in the midportion of the left  anterior     descending artery. 3. The left main is short.  It bifurcates into a large LAD and a     dominant circumflex vessel. 4. The LAD is noted, has some very mild calcification in the     midvessel.  The LAD proximally is a large-caliber vessel without     critical disease.  There is minimal luminal irregularity in the     midportion of the artery.  No significant areas of focal     obstruction were identified.  The LAD has 1 major diagonal branch     and several smaller diagonal branches in the apical vessel wraps     the apical tip. 5. The circumflex is a fairly large-caliber vessel.  It is a dominant     vessel.  It provides a large bifurcating then trifurcating marginal     branch, and then courses posteriorly where provides a     posterolateral system in a posterior descending branch.  Focal     obstruction is not noted. 6. The right coronary artery is a nondominant vessel.  It is smooth.     There is a tiny PDA branch which probably courses parallel to the     larger circumflex branch.  There is a small to moderate acute     marginal branch as well.  CONCLUSIONS: 1. Well preserved overall left ventricular systolic function. 2. No evidence of high-grade focal coronary obstruction. 3. Minimal calcification of the LAD.  Assessment and Plan:  1. History of palpitations and relatively high resting heart rate in the setting of obesity and COPD. No change in symptoms, ECG reviewed and stable. Plan to continue Cartia XT and observation.  2. Essential hypertension, additional medical regimen includes HCTZ and lisinopril.  3. Mild coronary atherosclerosis by cardiac catheterization in 2012. She is on aspirin and WelChol.  Current medicines were reviewed with the patient today.   Orders Placed This Encounter  Procedures  . EKG 12-Lead    Disposition: Follow-up in one year.  Signed, Satira Sark, MD, [facility] 10/24/2016 11:05 AM    [facility]  at [place]. Main  [address], [place], [place] [address] Phone: [phone]; Fax: [phone]

## 2016-10-25 ENCOUNTER — Encounter (INDEPENDENT_AMBULATORY_CARE_PROVIDER_SITE_OTHER): Payer: Self-pay | Admitting: Internal Medicine

## 2016-10-25 ENCOUNTER — Ambulatory Visit (INDEPENDENT_AMBULATORY_CARE_PROVIDER_SITE_OTHER): Payer: PPO | Admitting: Internal Medicine

## 2016-11-18 ENCOUNTER — Other Ambulatory Visit (INDEPENDENT_AMBULATORY_CARE_PROVIDER_SITE_OTHER): Payer: Self-pay | Admitting: Internal Medicine

## 2016-12-07 ENCOUNTER — Ambulatory Visit (INDEPENDENT_AMBULATORY_CARE_PROVIDER_SITE_OTHER): Payer: PPO | Admitting: Podiatry

## 2016-12-07 NOTE — Progress Notes (Signed)
Erroneous encounter no show

## 2017-02-15 ENCOUNTER — Encounter (HOSPITAL_COMMUNITY): Payer: Self-pay | Admitting: Emergency Medicine

## 2017-02-15 ENCOUNTER — Emergency Department (HOSPITAL_COMMUNITY)
Admission: EM | Admit: 2017-02-15 | Discharge: 2017-02-15 | Disposition: A | Discharge status: Home / Self Care | Payer: PPO | Attending: Emergency Medicine | Admitting: Emergency Medicine

## 2017-02-15 DIAGNOSIS — J45909 Unspecified asthma, uncomplicated: Secondary | ICD-10-CM | POA: Insufficient documentation

## 2017-02-15 DIAGNOSIS — J449 Chronic obstructive pulmonary disease, unspecified: Secondary | ICD-10-CM | POA: Diagnosis not present

## 2017-02-15 DIAGNOSIS — E119 Type 2 diabetes mellitus without complications: Secondary | ICD-10-CM | POA: Diagnosis not present

## 2017-02-15 DIAGNOSIS — Z85850 Personal history of malignant neoplasm of thyroid: Secondary | ICD-10-CM | POA: Insufficient documentation

## 2017-02-15 DIAGNOSIS — E039 Hypothyroidism, unspecified: Secondary | ICD-10-CM | POA: Diagnosis not present

## 2017-02-15 DIAGNOSIS — R2241 Localized swelling, mass and lump, right lower limb: Secondary | ICD-10-CM | POA: Diagnosis not present

## 2017-02-15 DIAGNOSIS — I10 Essential (primary) hypertension: Secondary | ICD-10-CM | POA: Diagnosis not present

## 2017-02-15 DIAGNOSIS — M79604 Pain in right leg: Secondary | ICD-10-CM | POA: Insufficient documentation

## 2017-02-15 MED ORDER — CEPHALEXIN 500 MG PO CAPS
500.0000 mg | ORAL_CAPSULE | Freq: Once | ORAL | Status: AC
  Administered 2017-02-15: 500 mg via ORAL
  Filled 2017-02-15: qty 1

## 2017-02-15 MED ORDER — HYDROCODONE-ACETAMINOPHEN 5-325 MG PO TABS: 1.0000 | ORAL_TABLET | ORAL | 0 refills | Status: DC | PRN

## 2017-02-15 MED ORDER — OXYCODONE-ACETAMINOPHEN 5-325 MG PO TABS
2.0000 | ORAL_TABLET | Freq: Once | ORAL | Status: DC
  Filled 2017-02-15: qty 2

## 2017-02-15 MED ORDER — OXYCODONE-ACETAMINOPHEN 5-325 MG PO TABS: 1.0000 | ORAL_TABLET | ORAL | 0 refills | Status: DC | PRN

## 2017-02-15 MED ORDER — RIVAROXABAN (XARELTO) VTE STARTER PACK (15 & 20 MG): ORAL_TABLET | ORAL | 0 refills | Status: DC

## 2017-02-15 MED ORDER — ENOXAPARIN SODIUM 150 MG/ML ~~LOC~~ SOLN
1.0000 mg/kg | Freq: Once | SUBCUTANEOUS | Status: AC
  Administered 2017-02-15: 130 mg via SUBCUTANEOUS
  Filled 2017-02-15: qty 1

## 2017-02-15 MED ORDER — CEPHALEXIN 500 MG PO CAPS: 500.0000 mg | ORAL_CAPSULE | Freq: Four times a day (QID) | ORAL | 0 refills | Status: DC

## 2017-02-15 MED ORDER — HYDROCODONE-ACETAMINOPHEN 5-325 MG PO TABS
2.0000 | ORAL_TABLET | Freq: Once | ORAL | Status: AC
  Administered 2017-02-15: 2 via ORAL
  Filled 2017-02-15: qty 2

## 2017-02-15 NOTE — ED Provider Notes (Signed)
Emergency Department Provider Note   I have reviewed the triage vital signs and the nursing notes.   HISTORY  Chief Complaint Leg Pain   HPI [name] [name] is a 61 y.o. female past medical history of cirrhosis, COPD, reflux, diabetes, anxiety and asthma presents to the emergency department today secondary to approximately 1 week of progressively worsening right lower extremity swelling that has improved today but also worsening right lower extremity erythema. Says this summer to previous episodes of cellulitis however has not seen anybody or retreated this current episode. She states that this is a little bit different, that does have a sharp stinging pain to it which is new for her. She has no history of blood clots. No history of heart failure. She states her left leg is normal. She does have a cat scratches her legs quite a bit but does not recall that happening in this area. Seems like the swelling improves with elevation but that makes the pain worse and vice versa.   Past Medical History:  Diagnosis Date  . Anemia   . Anxiety   . Arthritis   . Asthma   . Cirrhosis of liver ([place])   . COPD (chronic obstructive pulmonary disease) ([place])   . Depression   . Edema   . Essential hypertension, benign   . GERD (gastroesophageal reflux disease)   . Hypothyroidism   . Morbid obesity ([place])   . Sleep apnea    CPAP - not consistent  . Thyroid cancer ([place])   . Type 2 diabetes mellitus [facility])     Patient Active Problem List   Diagnosis Date Noted  . Hepatic cirrhosis ([place]) 09/01/2014  . Cirrhosis, nonalcoholic ([place]) 20/94/7096  . Incisional hernia 06/09/2014  . S/P complete repair of rotator cuff 07/24/2012  . Precordial pain 09/30/2010  . Essential hypertension, benign 09/30/2010  . Type 2 diabetes mellitus ([place]) 09/05/2008  . OBESITY-MORBID (>100') 09/05/2008  . ESOPHAGEAL REFLUX 09/05/2008  . EDEMA 09/05/2008    Past Surgical History:  Procedure Laterality Date  .  "pump bumps"     bilateral heels--2000  . ABDOMINAL HYSTERECTOMY  1985  . CARDIAC CATHETERIZATION  2012   Dr. Lia Foyer told her nothing was wrong.  [name] [place]  . CHOLECYSTECTOMY  1996  . COLONOSCOPY WITH PROPOFOL N/A 04/17/2015   Procedure: COLONOSCOPY WITH PROPOFOL  at cecum at 0810; withdrawal time =15 minutes;  Surgeon: [name], MD;  [facility];  Service: Endoscopy;  Laterality: N/A;  . Debridement of abdominal wound  2011  . ESOPHAGEAL DILATION N/A 04/17/2015   Procedure: ESOPHAGEAL DILATION WITH 56FR MALONEY DILATOR;  Surgeon: [name], MD;  [facility];  Service: Endoscopy;  Laterality: N/A;  . ESOPHAGOGASTRODUODENOSCOPY (EGD) WITH PROPOFOL N/A 04/17/2015   Procedure: ESOPHAGOGASTRODUODENOSCOPY (EGD) WITH PROPOFOL;  Surgeon: [name], MD;  [facility];  Service: Endoscopy;  Laterality: N/A;  . EXPLORATORY LAPAROTOMY  2003  . FOOT SURGERY Bilateral 2000   Bone spur removed  . INCISIONAL HERNIA REPAIR N/A 06/09/2014   Procedure: RECURRENT  INCISIONAL HERNIORRHAPHY WITH MESH;  Surgeon: [name], MD;  [facility];  Service: General;  Laterality: N/A;  . INCISIONAL HERNIA REPAIR N/A 10/15/2014   Procedure: [place];  Surgeon: [name], MD;  Location: [place];  Service: General;  Laterality: N/A;  . INSERTION OF MESH N/A 06/09/2014   Procedure: INSERTION OF MESH;  Surgeon: [name], MD;  [facility];  Service: General;  Laterality: N/A;  . INSERTION OF MESH N/A 10/15/2014   Procedure: INSERTION OF MESH;  Surgeon: [name], MD;  Location: [place];  Service: General;  Laterality: N/A;  . POLYPECTOMY  04/17/2015   Procedure: POLYPECTOMY;  Surgeon: [name], MD;  [facility];  Service: Endoscopy;;  . RESECTION DISTAL CLAVICAL  05/07/2012   Procedure: RESECTION DISTAL CLAVICAL;  Surgeon: [name], MD;  [facility];  Service: Orthopedics;  Laterality: Right;   . SHOULDER OPEN ROTATOR CUFF REPAIR  05/07/2012   Procedure: ROTATOR CUFF REPAIR SHOULDER OPEN;  Surgeon: [name], MD;  [facility];  Service: Orthopedics;  Laterality: Right;  . THYROIDECTOMY  2009  . TONSILLECTOMY  1958  . UMBILICAL HERNIA REPAIR  2010    Current Outpatient Rx  . Order #: [id number] Class: Historical Med  . Order #: [id number] Class: Historical Med  . Order #: [id number] Class: Historical Med  . Order #: [id number] Class: Normal  . Order #: [id number] Class: Historical Med  . Order #: [id number] Class: Historical Med  . Order #: [id number] Class: Historical Med  . Order #: [id number] Class: Normal  . Order #: [id number] Class: Historical Med  . Order #: [id number] Class: Historical Med  . Order #: [id number] Class: Historical Med  . Order #: [id number] Class: Historical Med  . Order #: [id number] Class: Historical Med  . Order #: [id number] Class: Historical Med  . Order #: [id number] Class: Normal  . Order #: [id number] Class: Historical Med  . Order #: [id number] Class: Historical Med  . Order #: [id number] Class: Historical Med  . Order #: [id number] Class: Print  . Order #: [id number] Class: Historical Med  . Order #: [id number] Class: Print  . Order #: [id number] Class: Print    Allergies Doxycycline; Adhesive [tape]; Biaxin [clarithromycin]; Percocet [oxycodone-acetaminophen]; Clarithromycin; Clindamycin/lincomycin; Neosporin [neomycin-polymyxin b gu]; and Penicillins  Family History  Problem Relation Age of Onset  . Heart disease Unknown   . Arthritis Unknown   . Cancer Unknown   . Asthma Unknown   . Diabetes Unknown   . Kidney disease Unknown     Social History Social History  Substance Use Topics  . Smoking status: Never Smoker  . Smokeless tobacco: Never Used  . Alcohol use No    Review of Systems Constitutional: No fever/chills Eyes: No visual changes. ENT: No sore throat. Cardiovascular: Denies chest pain. Respiratory: Denies shortness of  breath. Gastrointestinal: No abdominal pain.  No nausea, no vomiting.  No diarrhea.  No constipation. Genitourinary: Negative for dysuria. Musculoskeletal: Negative for back pain. Skin: Negative for rash aside from her right lower leg. Neurological: Negative for headaches, focal weakness or numbness.  10-point ROS otherwise negative.  ____________________________________________   PHYSICAL EXAM:  VITAL SIGNS: ED Triage Vitals  Enc Vitals Group     BP 02/15/17 1908 (!) 181/58     Pulse Rate 02/15/17 1908 (!) 110     Resp 02/15/17 1908 18     Temp 02/15/17 1908 98.1 F (36.7 C)     Temp Source 02/15/17 1908 Oral     SpO2 02/15/17 1908 95 %     Weight 02/15/17 1909 288 lb 9.6 oz (130.9 kg)     Height --      Head Circumference --      Peak Flow --      Pain Score 02/15/17 1908 6     Pain Loc --      Pain Edu? --      Excl. in [place]? --     Constitutional:  Alert and oriented. Well appearing and in no acute distress. Eyes: Conjunctivae are normal. PERRL. EOMI. Head: Atraumatic. Nose: No congestion/rhinnorhea. Mouth/Throat: Mucous membranes are moist.  Oropharynx non-erythematous. Neck: No stridor.  No meningeal signs.   Cardiovascular: Normal rate, regular rhythm. Good peripheral circulation. Grossly normal heart sounds.   Respiratory: Normal respiratory effort.  No retractions. Lungs CTAB. Gastrointestinal: Soft and nontender. No distention.  Musculoskeletal: No lower extremity tenderness nor edema. No gross deformities of extremities. Right leg is more swollen than her left leg. Neurologic:  Normal speech and language. No gross focal neurologic deficits are appreciated.  Skin:  Skin is warm, dry and intact. Mild erythema to the anterolateral aspect of her right leg. Multiple scabs elsewhere on her leg. She does have some clear fluid weeping from other wounds around this area.   Procedures   ____________________________________________   INITIAL IMPRESSION / ASSESSMENT  AND PLAN / ED COURSE  Pertinent labs & imaging results that were available during my care of the patient were reviewed by me and considered in my medical decision making (see chart for details).  Low suspicion for CHF without a history of the same however she is at some risk for it. Her left leg is not swollen and she has no shortness of breath or crackles. This is more likely cellulitis that she's had a history of the same is feels almost exactly same I will treat for that. deep vein thrombosis is obviously on the differentialI will give a dose of Lovenox have her come back in the morning for an ultrasound. A starter pack of xarelto has been ordered in case that is the case. She initially had a heart rate of 110 she states again time she moves around on my examination she had a heart rate of 96 palpated radially. The patient is stable for discharge home without indication for further workup.   ____________________________________________  FINAL CLINICAL IMPRESSION(S) / ED DIAGNOSES  Final diagnoses:  Right leg pain     MEDICATIONS GIVEN DURING THIS VISIT:  Medications  enoxaparin (LOVENOX) injection 130 mg (not administered)  cephALEXin (KEFLEX) capsule 500 mg (not administered)  oxyCODONE-acetaminophen (PERCOCET/ROXICET) 5-325 MG per tablet 2 tablet (not administered)     NEW OUTPATIENT MEDICATIONS STARTED DURING THIS VISIT:  New Prescriptions   CEPHALEXIN (KEFLEX) 500 MG CAPSULE    Take 1 capsule (500 mg total) by mouth 4 (four) times daily.   OXYCODONE-ACETAMINOPHEN (PERCOCET) 5-325 MG TABLET    Take 1-2 tablets by mouth every 4 (four) hours as needed.   RIVAROXABAN 15 & 20 MG TBPK    Take as directed on package: Start with one 5m tablet by mouth twice a day with food. On Day 22, switch to one 249mtablet once a day with food.    Note:  This document was prepared using Dragon voice recognition software and may include unintentional dictation errors.    MeMerrily Pew MD 02/15/17 2059

## 2017-02-15 NOTE — ED Triage Notes (Signed)
Pt c/o right lower leg pain, swelling and redness for a few days.

## 2017-02-16 ENCOUNTER — Ambulatory Visit (HOSPITAL_COMMUNITY)
Admission: RE | Admit: 2017-02-16 | Discharge: 2017-02-16 | Disposition: A | Discharge status: Home / Self Care | Payer: PPO | Source: Ambulatory Visit | Attending: Emergency Medicine | Admitting: Emergency Medicine

## 2017-02-16 DIAGNOSIS — M79604 Pain in right leg: Secondary | ICD-10-CM | POA: Insufficient documentation

## 2017-02-16 DIAGNOSIS — R600 Localized edema: Secondary | ICD-10-CM | POA: Diagnosis not present

## 2017-02-23 DIAGNOSIS — Z6841 Body Mass Index (BMI) 40.0 and over, adult: Secondary | ICD-10-CM | POA: Diagnosis not present

## 2017-02-23 DIAGNOSIS — M1991 Primary osteoarthritis, unspecified site: Secondary | ICD-10-CM | POA: Diagnosis not present

## 2017-02-23 DIAGNOSIS — E611 Iron deficiency: Secondary | ICD-10-CM | POA: Diagnosis not present

## 2017-02-23 DIAGNOSIS — Z1389 Encounter for screening for other disorder: Secondary | ICD-10-CM | POA: Diagnosis not present

## 2017-02-23 DIAGNOSIS — L03119 Cellulitis of unspecified part of limb: Secondary | ICD-10-CM | POA: Diagnosis not present

## 2017-02-23 DIAGNOSIS — E782 Mixed hyperlipidemia: Secondary | ICD-10-CM | POA: Diagnosis not present

## 2017-02-23 DIAGNOSIS — E1140 Type 2 diabetes mellitus with diabetic neuropathy, unspecified: Secondary | ICD-10-CM | POA: Diagnosis not present

## 2017-02-23 DIAGNOSIS — E538 Deficiency of other specified B group vitamins: Secondary | ICD-10-CM | POA: Diagnosis not present

## 2017-02-23 DIAGNOSIS — C73 Malignant neoplasm of thyroid gland: Secondary | ICD-10-CM | POA: Diagnosis not present

## 2017-03-23 DIAGNOSIS — R600 Localized edema: Secondary | ICD-10-CM | POA: Diagnosis not present

## 2017-03-23 DIAGNOSIS — E538 Deficiency of other specified B group vitamins: Secondary | ICD-10-CM | POA: Diagnosis not present

## 2017-03-23 DIAGNOSIS — E119 Type 2 diabetes mellitus without complications: Secondary | ICD-10-CM | POA: Diagnosis not present

## 2017-03-23 DIAGNOSIS — L03115 Cellulitis of right lower limb: Secondary | ICD-10-CM | POA: Diagnosis not present

## 2017-03-23 DIAGNOSIS — Z6841 Body Mass Index (BMI) 40.0 and over, adult: Secondary | ICD-10-CM | POA: Diagnosis not present

## 2017-03-23 DIAGNOSIS — I872 Venous insufficiency (chronic) (peripheral): Secondary | ICD-10-CM | POA: Diagnosis not present

## 2017-03-23 DIAGNOSIS — E669 Obesity, unspecified: Secondary | ICD-10-CM | POA: Diagnosis not present

## 2017-03-23 DIAGNOSIS — L0109 Other impetigo: Secondary | ICD-10-CM | POA: Diagnosis not present

## 2017-03-28 ENCOUNTER — Encounter (INDEPENDENT_AMBULATORY_CARE_PROVIDER_SITE_OTHER): Payer: Self-pay | Admitting: Internal Medicine

## 2017-03-28 ENCOUNTER — Ambulatory Visit (INDEPENDENT_AMBULATORY_CARE_PROVIDER_SITE_OTHER): Payer: PPO | Admitting: Internal Medicine

## 2017-03-28 VITALS — BP 140/80 | HR 76 | Temp 98.0°F | Resp 18 | Ht 65.0 in | Wt 288.2 lb

## 2017-03-28 DIAGNOSIS — K219 Gastro-esophageal reflux disease without esophagitis: Secondary | ICD-10-CM | POA: Diagnosis not present

## 2017-03-28 DIAGNOSIS — K7460 Unspecified cirrhosis of liver: Secondary | ICD-10-CM

## 2017-03-28 MED ORDER — PANTOPRAZOLE SODIUM 40 MG PO TBEC: 40.0000 mg | DELAYED_RELEASE_TABLET | Freq: Every day | ORAL | 3 refills | Status: DC

## 2017-03-28 NOTE — Progress Notes (Signed)
Presenting complaint;  Follow-up for cirrhosis and GERD.  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 She was last seen on 04/26/2016. She has lost 22 pounds since her last visit. She states she does not have a good appetite. She also has drastically cut back on sweets. She does not do much physical activity on account of knee and back pain. She does do some exercise while sitting or supine. She feels heartburn is well controlled with therapy. She denies dysphagia nausea vomiting or abdominal pain. Bowels move every other day. She denies melena or rectal bleeding. She says itching has decreased. She is being treated for cellulitis involving right leg. She states she takes furosemide once or twice a week.    Current Medications: Outpatient Encounter Prescriptions as of 03/28/2017  Medication Sig  . albuterol (PROVENTIL HFA) 108 (90 BASE) MCG/ACT inhaler Inhale 2 puffs into the lungs every 6 (six) hours as needed for wheezing or shortness of breath. Shortness of breath  . aspirin EC 81 MG tablet Take 81 mg by mouth daily.  [name] CARTIA XT 300 MG 24 hr capsule TAKE ONE (1) CAPSULE EACH DAY  . colesevelam (WELCHOL) 625 MG tablet Take 1,875 mg by mouth 2 (two) times daily with a meal. 3 tabs am 3 tabs pm  . doxycycline (VIBRA-TABS) 100 MG tablet Take 100 mg by mouth 2 (two) times daily.   . DULoxetine (CYMBALTA) 30 MG capsule Take 30 mg by mouth daily.   . DULoxetine (CYMBALTA) 60 MG capsule Take 60 mg by mouth daily.   [name] econazole nitrate 1 % cream Apply topically daily. (Patient taking differently: Apply 1 application topically daily as needed (for rash irritation). )  . furosemide (LASIX) 20 MG tablet Take 20 mg by mouth daily as needed for fluid.   . hydrochlorothiazide (HYDRODIURIL) 25 MG tablet Take 25 mg by mouth daily.   [name] HYDROmorphone (DILAUDID) 4 MG tablet Take 4 mg by mouth every 6 (six) hours as  needed for severe pain.   [name] lisinopril (PRINIVIL,ZESTRIL) 20 MG tablet Take 20 mg by mouth daily.   . meloxicam (MOBIC) 15 MG tablet Take 15 mg by mouth daily.   . metFORMIN (GLUCOPHAGE) 1000 MG tablet Take 1,000 mg by mouth 2 (two) times daily with a meal.  . nystatin (NYSTOP) 100000 UNIT/GM POWD Apply 100,000 g topically daily as needed (for rash-irritation). Rash  . nystatin cream (MYCOSTATIN) Apply 1 application topically 3 (three) times daily.   . pantoprazole (PROTONIX) 40 MG tablet TAKE ONE TABLET EACH DAY BEFORE BREAKFAST.  . SYNTHROID 300 MCG tablet Take 250 mcg by mouth daily before breakfast.   . tolterodine (DETROL) 2 MG tablet Take 2 mg by mouth 2 (two) times daily.   . VOLTAREN 1 % GEL Apply 4 g topically daily as needed (pain). For pain  . [DISCONTINUED] ALPRAZolam (XANAX) 0.5 MG tablet Take 0.5 mg by mouth as needed for anxiety.  . [DISCONTINUED] cephALEXin (KEFLEX) 500 MG capsule Take 1 capsule (500 mg total) by mouth 4 (four) times daily. (Patient not taking: Reported on 03/28/2017)  . [DISCONTINUED] HYDROcodone-acetaminophen (NORCO/VICODIN) 5-325 MG tablet Take 1-2 tablets by mouth every 4 (four) hours as needed. (Patient not taking: Reported on 03/28/2017)  . [DISCONTINUED] Rivaroxaban 15 & 20 MG TBPK Take as directed on package: Start with one 19m tablet by mouth twice a day with food. On Day 22, switch to one 285mtablet once  a day with food. (Patient not taking: Reported on 03/28/2017)   No facility-administered encounter medications on file as of 03/28/2017.      Objective: Blood pressure 140/80, pulse 76, temperature 98 F (36.7 C), temperature source Oral, resp. rate 18, height 5' 5"  (1.651 m), weight 288 lb 3.2 oz (130.7 kg). Patient is alert and in no acute distress. She does not have asterixis. Conjunctiva is pink. Sclera is nonicteric Oropharyngeal mucosa is normal. No neck masses or thyromegaly noted. Cardiac exam with regular rhythm normal S1 and S2. No murmur  or gallop noted. Lungs are clear to auscultation. Abdomen is obese with multiple scratch marks and superficial ulcer in mid abdomen she also has large midabdominal hernia without any tenderness. No organomegaly or masses.  She has multiple scratch marks involving the extremities. She has bilateral leg edema slightly more on the right side where there is erythema and edema to the skin.  Labs/studies Results:   recent lab studies requested from Dr. Nolon Rod office.  Assessment:  #1. Cirrhosis secondary to NASH. She has well compensated disease. She had EGD 2 years ago and was negative for esophageal or gastric varices. She had erosive gastritis but H. pylori serology was negative. It is reassuring to see that she is losing weight and age would definitely slow progression of her liver disease.  #2. GERD. She is doing well with therapy. If she continues to lose weight would consider changing PPI dose to every other day next year.  #3. She is average risk for CRC. She is up-to-date on screening for CRC. Last colonoscopy was in October 2016 with removal of single polyp and was inflammatory. Next reaming would be in 2026.   Plan:  Requests copy of recent blood work from Dr. Nolon Rod office. AFP. Right upper quadrant abdominal ultrasound. Office visit in one year.

## 2017-03-28 NOTE — Patient Instructions (Signed)
Physician will call with results of blood tests and ultrasound and completed.

## 2017-04-03 DIAGNOSIS — K7460 Unspecified cirrhosis of liver: Secondary | ICD-10-CM | POA: Diagnosis not present

## 2017-04-04 ENCOUNTER — Ambulatory Visit (HOSPITAL_COMMUNITY): Admission: RE | Admit: 2017-04-04 | Payer: PPO | Source: Ambulatory Visit

## 2017-04-04 LAB — AFP TUMOR MARKER: AFP-Tumor Marker: 3.2 ng/mL

## 2017-04-05 ENCOUNTER — Ambulatory Visit (HOSPITAL_COMMUNITY)
Admission: RE | Admit: 2017-04-05 | Discharge: 2017-04-05 | Disposition: A | Discharge status: Home / Self Care | Payer: PPO | Source: Ambulatory Visit | Attending: Internal Medicine | Admitting: Internal Medicine

## 2017-04-05 DIAGNOSIS — Z9049 Acquired absence of other specified parts of digestive tract: Secondary | ICD-10-CM | POA: Diagnosis not present

## 2017-04-05 DIAGNOSIS — K7460 Unspecified cirrhosis of liver: Secondary | ICD-10-CM | POA: Diagnosis not present

## 2017-04-06 ENCOUNTER — Telehealth (INDEPENDENT_AMBULATORY_CARE_PROVIDER_SITE_OTHER): Payer: Self-pay | Admitting: Internal Medicine

## 2017-04-06 ENCOUNTER — Other Ambulatory Visit (INDEPENDENT_AMBULATORY_CARE_PROVIDER_SITE_OTHER): Payer: Self-pay | Admitting: *Deleted

## 2017-04-06 DIAGNOSIS — K7460 Unspecified cirrhosis of liver: Secondary | ICD-10-CM

## 2017-04-06 DIAGNOSIS — D649 Anemia, unspecified: Secondary | ICD-10-CM

## 2017-04-06 NOTE — Telephone Encounter (Signed)
Patient called, lmoam, stated she missed our call again.  [phone]

## 2017-04-06 NOTE — Telephone Encounter (Signed)
I left a message on the patient's answering machine. Repeat H&H in 2 months and the AFP in 6 months. She will be sent a letter as a reminder.

## 2017-04-19 ENCOUNTER — Other Ambulatory Visit: Payer: Self-pay | Admitting: Cardiology

## 2017-04-24 DIAGNOSIS — I872 Venous insufficiency (chronic) (peripheral): Secondary | ICD-10-CM | POA: Diagnosis not present

## 2017-04-24 DIAGNOSIS — L280 Lichen simplex chronicus: Secondary | ICD-10-CM | POA: Diagnosis not present

## 2017-04-26 DIAGNOSIS — E1140 Type 2 diabetes mellitus with diabetic neuropathy, unspecified: Secondary | ICD-10-CM | POA: Diagnosis not present

## 2017-04-26 DIAGNOSIS — M1711 Unilateral primary osteoarthritis, right knee: Secondary | ICD-10-CM | POA: Diagnosis not present

## 2017-04-26 DIAGNOSIS — E538 Deficiency of other specified B group vitamins: Secondary | ICD-10-CM | POA: Diagnosis not present

## 2017-04-26 DIAGNOSIS — Z6841 Body Mass Index (BMI) 40.0 and over, adult: Secondary | ICD-10-CM | POA: Diagnosis not present

## 2017-04-26 DIAGNOSIS — Z23 Encounter for immunization: Secondary | ICD-10-CM | POA: Diagnosis not present

## 2017-05-08 DIAGNOSIS — I872 Venous insufficiency (chronic) (peripheral): Secondary | ICD-10-CM | POA: Diagnosis not present

## 2017-05-08 DIAGNOSIS — L820 Inflamed seborrheic keratosis: Secondary | ICD-10-CM | POA: Diagnosis not present

## 2017-05-15 ENCOUNTER — Encounter (INDEPENDENT_AMBULATORY_CARE_PROVIDER_SITE_OTHER): Payer: Self-pay | Admitting: *Deleted

## 2017-05-15 ENCOUNTER — Other Ambulatory Visit (INDEPENDENT_AMBULATORY_CARE_PROVIDER_SITE_OTHER): Payer: Self-pay | Admitting: *Deleted

## 2017-05-15 DIAGNOSIS — D649 Anemia, unspecified: Secondary | ICD-10-CM

## 2017-05-15 DIAGNOSIS — K7460 Unspecified cirrhosis of liver: Secondary | ICD-10-CM

## 2017-05-23 DIAGNOSIS — K432 Incisional hernia without obstruction or gangrene: Secondary | ICD-10-CM | POA: Diagnosis not present

## 2017-05-27 ENCOUNTER — Other Ambulatory Visit: Payer: Self-pay | Admitting: Surgery

## 2017-05-27 DIAGNOSIS — Z8719 Personal history of other diseases of the digestive system: Secondary | ICD-10-CM

## 2017-05-27 DIAGNOSIS — Z9889 Other specified postprocedural states: Principal | ICD-10-CM

## 2017-06-06 ENCOUNTER — Other Ambulatory Visit (HOSPITAL_COMMUNITY): Payer: Self-pay | Admitting: Surgery

## 2017-06-06 DIAGNOSIS — Z9889 Other specified postprocedural states: Principal | ICD-10-CM

## 2017-06-06 DIAGNOSIS — Z8719 Personal history of other diseases of the digestive system: Secondary | ICD-10-CM

## 2017-06-14 ENCOUNTER — Ambulatory Visit (HOSPITAL_COMMUNITY)
Admission: RE | Admit: 2017-06-14 | Discharge: 2017-06-14 | Disposition: A | Discharge status: Home / Self Care | Payer: PPO | Source: Ambulatory Visit | Attending: Surgery | Admitting: Surgery

## 2017-06-14 DIAGNOSIS — Z8719 Personal history of other diseases of the digestive system: Secondary | ICD-10-CM | POA: Diagnosis not present

## 2017-06-14 DIAGNOSIS — K439 Ventral hernia without obstruction or gangrene: Secondary | ICD-10-CM | POA: Diagnosis not present

## 2017-06-14 DIAGNOSIS — Z9889 Other specified postprocedural states: Secondary | ICD-10-CM | POA: Insufficient documentation

## 2017-06-14 DIAGNOSIS — R161 Splenomegaly, not elsewhere classified: Secondary | ICD-10-CM | POA: Insufficient documentation

## 2017-06-14 DIAGNOSIS — K7460 Unspecified cirrhosis of liver: Secondary | ICD-10-CM | POA: Insufficient documentation

## 2017-06-14 DIAGNOSIS — K56609 Unspecified intestinal obstruction, unspecified as to partial versus complete obstruction: Secondary | ICD-10-CM | POA: Insufficient documentation

## 2017-06-14 LAB — POCT I-STAT CREATININE: Creatinine, Ser: 0.8 mg/dL (ref 0.44–1.00)

## 2017-06-14 MED ORDER — IOPAMIDOL (ISOVUE-300) INJECTION 61%
100.0000 mL | Freq: Once | INTRAVENOUS | Status: AC | PRN
  Administered 2017-06-14: 100 mL via INTRAVENOUS

## 2017-06-22 DIAGNOSIS — Z1389 Encounter for screening for other disorder: Secondary | ICD-10-CM | POA: Diagnosis not present

## 2017-06-22 DIAGNOSIS — I10 Essential (primary) hypertension: Secondary | ICD-10-CM | POA: Diagnosis not present

## 2017-06-22 DIAGNOSIS — E538 Deficiency of other specified B group vitamins: Secondary | ICD-10-CM | POA: Diagnosis not present

## 2017-06-22 DIAGNOSIS — G894 Chronic pain syndrome: Secondary | ICD-10-CM | POA: Diagnosis not present

## 2017-06-22 DIAGNOSIS — F329 Major depressive disorder, single episode, unspecified: Secondary | ICD-10-CM | POA: Diagnosis not present

## 2017-06-22 DIAGNOSIS — M159 Polyosteoarthritis, unspecified: Secondary | ICD-10-CM | POA: Diagnosis not present

## 2017-06-22 DIAGNOSIS — E1140 Type 2 diabetes mellitus with diabetic neuropathy, unspecified: Secondary | ICD-10-CM | POA: Diagnosis not present

## 2017-06-22 DIAGNOSIS — K660 Peritoneal adhesions (postprocedural) (postinfection): Secondary | ICD-10-CM | POA: Diagnosis not present

## 2017-06-22 DIAGNOSIS — G4739 Other sleep apnea: Secondary | ICD-10-CM | POA: Diagnosis not present

## 2017-06-22 DIAGNOSIS — J45909 Unspecified asthma, uncomplicated: Secondary | ICD-10-CM | POA: Diagnosis not present

## 2017-06-22 DIAGNOSIS — Z6841 Body Mass Index (BMI) 40.0 and over, adult: Secondary | ICD-10-CM | POA: Diagnosis not present

## 2017-06-23 DIAGNOSIS — K432 Incisional hernia without obstruction or gangrene: Secondary | ICD-10-CM | POA: Diagnosis not present

## 2017-07-26 DIAGNOSIS — Z6841 Body Mass Index (BMI) 40.0 and over, adult: Secondary | ICD-10-CM | POA: Diagnosis not present

## 2017-07-26 DIAGNOSIS — G4739 Other sleep apnea: Secondary | ICD-10-CM | POA: Diagnosis not present

## 2017-07-26 DIAGNOSIS — M159 Polyosteoarthritis, unspecified: Secondary | ICD-10-CM | POA: Diagnosis not present

## 2017-07-26 DIAGNOSIS — F329 Major depressive disorder, single episode, unspecified: Secondary | ICD-10-CM | POA: Diagnosis not present

## 2017-07-26 DIAGNOSIS — E538 Deficiency of other specified B group vitamins: Secondary | ICD-10-CM | POA: Diagnosis not present

## 2017-07-26 DIAGNOSIS — I471 Supraventricular tachycardia: Secondary | ICD-10-CM | POA: Diagnosis not present

## 2017-07-26 DIAGNOSIS — L309 Dermatitis, unspecified: Secondary | ICD-10-CM | POA: Diagnosis not present

## 2017-07-26 DIAGNOSIS — E669 Obesity, unspecified: Secondary | ICD-10-CM | POA: Diagnosis not present

## 2017-08-23 DIAGNOSIS — E669 Obesity, unspecified: Secondary | ICD-10-CM | POA: Diagnosis not present

## 2017-08-23 DIAGNOSIS — R5383 Other fatigue: Secondary | ICD-10-CM | POA: Diagnosis not present

## 2017-08-23 DIAGNOSIS — B349 Viral infection, unspecified: Secondary | ICD-10-CM | POA: Diagnosis not present

## 2017-08-23 DIAGNOSIS — I10 Essential (primary) hypertension: Secondary | ICD-10-CM | POA: Diagnosis not present

## 2017-08-23 DIAGNOSIS — E538 Deficiency of other specified B group vitamins: Secondary | ICD-10-CM | POA: Diagnosis not present

## 2017-08-23 DIAGNOSIS — E119 Type 2 diabetes mellitus without complications: Secondary | ICD-10-CM | POA: Diagnosis not present

## 2017-08-23 DIAGNOSIS — R6889 Other general symptoms and signs: Secondary | ICD-10-CM | POA: Diagnosis not present

## 2017-08-23 DIAGNOSIS — Z6841 Body Mass Index (BMI) 40.0 and over, adult: Secondary | ICD-10-CM | POA: Diagnosis not present

## 2017-08-28 DIAGNOSIS — J069 Acute upper respiratory infection, unspecified: Secondary | ICD-10-CM | POA: Diagnosis not present

## 2017-08-28 DIAGNOSIS — J0100 Acute maxillary sinusitis, unspecified: Secondary | ICD-10-CM | POA: Diagnosis not present

## 2017-08-28 DIAGNOSIS — Z6841 Body Mass Index (BMI) 40.0 and over, adult: Secondary | ICD-10-CM | POA: Diagnosis not present

## 2017-09-04 ENCOUNTER — Other Ambulatory Visit (HOSPITAL_COMMUNITY): Payer: Self-pay | Admitting: Internal Medicine

## 2017-09-04 ENCOUNTER — Ambulatory Visit (HOSPITAL_COMMUNITY)
Admission: RE | Admit: 2017-09-04 | Discharge: 2017-09-04 | Disposition: A | Discharge status: Home / Self Care | Payer: PPO | Source: Ambulatory Visit | Attending: Internal Medicine | Admitting: Internal Medicine

## 2017-09-04 DIAGNOSIS — R059 Cough, unspecified: Secondary | ICD-10-CM

## 2017-09-04 DIAGNOSIS — R05 Cough: Secondary | ICD-10-CM

## 2017-09-04 DIAGNOSIS — I471 Supraventricular tachycardia: Secondary | ICD-10-CM | POA: Diagnosis not present

## 2017-09-04 DIAGNOSIS — Z6841 Body Mass Index (BMI) 40.0 and over, adult: Secondary | ICD-10-CM | POA: Diagnosis not present

## 2017-09-04 DIAGNOSIS — K7460 Unspecified cirrhosis of liver: Secondary | ICD-10-CM | POA: Diagnosis not present

## 2017-09-04 DIAGNOSIS — E669 Obesity, unspecified: Secondary | ICD-10-CM | POA: Diagnosis not present

## 2017-09-04 DIAGNOSIS — E063 Autoimmune thyroiditis: Secondary | ICD-10-CM | POA: Diagnosis not present

## 2017-09-04 DIAGNOSIS — J45909 Unspecified asthma, uncomplicated: Secondary | ICD-10-CM | POA: Diagnosis not present

## 2017-09-04 DIAGNOSIS — F321 Major depressive disorder, single episode, moderate: Secondary | ICD-10-CM | POA: Diagnosis not present

## 2017-09-14 DIAGNOSIS — E871 Hypo-osmolality and hyponatremia: Secondary | ICD-10-CM | POA: Diagnosis not present

## 2017-09-27 DIAGNOSIS — E611 Iron deficiency: Secondary | ICD-10-CM | POA: Diagnosis not present

## 2017-09-28 DIAGNOSIS — K432 Incisional hernia without obstruction or gangrene: Secondary | ICD-10-CM | POA: Diagnosis not present

## 2017-10-04 ENCOUNTER — Emergency Department (HOSPITAL_COMMUNITY): Payer: PPO

## 2017-10-04 ENCOUNTER — Emergency Department (HOSPITAL_COMMUNITY)
Admission: EM | Admit: 2017-10-04 | Discharge: 2017-10-04 | Disposition: A | Discharge status: Home / Self Care | Payer: PPO | Attending: Emergency Medicine | Admitting: Emergency Medicine

## 2017-10-04 ENCOUNTER — Encounter (HOSPITAL_COMMUNITY): Payer: Self-pay | Admitting: Emergency Medicine

## 2017-10-04 ENCOUNTER — Other Ambulatory Visit: Payer: Self-pay

## 2017-10-04 DIAGNOSIS — Y93E1 Activity, personal bathing and showering: Secondary | ICD-10-CM | POA: Insufficient documentation

## 2017-10-04 DIAGNOSIS — S82124A Nondisplaced fracture of lateral condyle of right tibia, initial encounter for closed fracture: Secondary | ICD-10-CM | POA: Diagnosis not present

## 2017-10-04 DIAGNOSIS — J45909 Unspecified asthma, uncomplicated: Secondary | ICD-10-CM | POA: Diagnosis not present

## 2017-10-04 DIAGNOSIS — W010XXA Fall on same level from slipping, tripping and stumbling without subsequent striking against object, initial encounter: Secondary | ICD-10-CM | POA: Insufficient documentation

## 2017-10-04 DIAGNOSIS — S82001A Unspecified fracture of right patella, initial encounter for closed fracture: Secondary | ICD-10-CM | POA: Diagnosis not present

## 2017-10-04 DIAGNOSIS — Y999 Unspecified external cause status: Secondary | ICD-10-CM | POA: Diagnosis not present

## 2017-10-04 DIAGNOSIS — S82101A Unspecified fracture of upper end of right tibia, initial encounter for closed fracture: Secondary | ICD-10-CM | POA: Insufficient documentation

## 2017-10-04 DIAGNOSIS — Z7984 Long term (current) use of oral hypoglycemic drugs: Secondary | ICD-10-CM | POA: Diagnosis not present

## 2017-10-04 DIAGNOSIS — M25561 Pain in right knee: Secondary | ICD-10-CM | POA: Diagnosis not present

## 2017-10-04 DIAGNOSIS — S8991XA Unspecified injury of right lower leg, initial encounter: Secondary | ICD-10-CM | POA: Diagnosis not present

## 2017-10-04 DIAGNOSIS — S82141A Displaced bicondylar fracture of right tibia, initial encounter for closed fracture: Secondary | ICD-10-CM

## 2017-10-04 DIAGNOSIS — Z7982 Long term (current) use of aspirin: Secondary | ICD-10-CM | POA: Diagnosis not present

## 2017-10-04 DIAGNOSIS — E119 Type 2 diabetes mellitus without complications: Secondary | ICD-10-CM | POA: Diagnosis not present

## 2017-10-04 DIAGNOSIS — I10 Essential (primary) hypertension: Secondary | ICD-10-CM | POA: Insufficient documentation

## 2017-10-04 DIAGNOSIS — Y929 Unspecified place or not applicable: Secondary | ICD-10-CM | POA: Diagnosis not present

## 2017-10-04 DIAGNOSIS — S82144A Nondisplaced bicondylar fracture of right tibia, initial encounter for closed fracture: Secondary | ICD-10-CM | POA: Diagnosis not present

## 2017-10-04 MED ORDER — HYDROCODONE-ACETAMINOPHEN 5-325 MG PO TABS
1.0000 | ORAL_TABLET | Freq: Once | ORAL | Status: AC
  Administered 2017-10-04: 1 via ORAL
  Filled 2017-10-04: qty 1

## 2017-10-04 MED ORDER — HYDROCODONE-ACETAMINOPHEN 7.5-325 MG PO TABS: 1.0000 | ORAL_TABLET | Freq: Four times a day (QID) | ORAL | 0 refills | Status: DC | PRN

## 2017-10-04 NOTE — Discharge Instructions (Addendum)
You have a fracture of your right knee.  You will need to keep the knee immobilizer on and be nonweightbearing on your right leg.  You may elevate and apply ice packs on and off do not apply heat.  I have arranged for home health to come out to help assist you with your daily needs.  Someone should call you to schedule a time.  Call Dr. Ruthe Mannan office tomorrow to arrange a follow-up appointment.

## 2017-10-04 NOTE — ED Provider Notes (Signed)
[facility] EMERGENCY DEPARTMENT Provider Note   CSN: [id number] Arrival date & time: 10/04/17  1019     History   Chief Complaint Chief Complaint  Patient presents with  . Knee Pain    HPI [name] [name] is a 62 y.o. female.  HPI   [name] [name] is a 62 y.o. female who presents to the Emergency Department complaining of right knee pain and swelling.  Symptoms began this morning after a mechanical fall that occurred while stepping out of her bathtub onto a towel.  States she is unable to bear weight to her knee.  Pain associated with any movement of the knee.  She has not tried any therapies prior to arrival.  She denies other injuries including head, back or neck pain, LOC, nausea or vomiting and hip pain.  Pt does take ASA daily    Past Medical History:  Diagnosis Date  . Anemia   . Anxiety   . Arthritis   . Asthma   . Cirrhosis of liver ([place])   . COPD (chronic obstructive pulmonary disease) ([place])   . Depression   . Edema   . Essential hypertension, benign   . GERD (gastroesophageal reflux disease)   . Hypothyroidism   . Morbid obesity ([place])   . Sleep apnea    CPAP - not consistent  . Thyroid cancer ([place])   . Type 2 diabetes mellitus [facility])     Patient Active Problem List   Diagnosis Date Noted  . Hepatic cirrhosis ([place]) 09/01/2014  . Cirrhosis, nonalcoholic ([place]) 93/81/0175  . Incisional hernia 06/09/2014  . S/P complete repair of rotator cuff 07/24/2012  . Precordial pain 09/30/2010  . Essential hypertension, benign 09/30/2010  . Type 2 diabetes mellitus ([place]) 09/05/2008  . OBESITY-MORBID (>100') 09/05/2008  . ESOPHAGEAL REFLUX 09/05/2008  . EDEMA 09/05/2008    Past Surgical History:  Procedure Laterality Date  . "pump bumps"     bilateral heels--2000  . ABDOMINAL HYSTERECTOMY  1985  . CARDIAC CATHETERIZATION  2012   Dr. Lia Foyer told her nothing was wrong.  [name] Brandt  . CHOLECYSTECTOMY  1996  . COLONOSCOPY WITH PROPOFOL N/A  04/17/2015   Procedure: COLONOSCOPY WITH PROPOFOL  at cecum at 0810; withdrawal time =15 minutes;  Surgeon: [name], MD;  Location: [facility];  Service: Endoscopy;  Laterality: N/A;  . Debridement of abdominal wound  2011  . ESOPHAGEAL DILATION N/A 04/17/2015   Procedure: ESOPHAGEAL DILATION WITH 56FR MALONEY DILATOR;  Surgeon: [name], MD;  Location: [facility];  Service: Endoscopy;  Laterality: N/A;  . ESOPHAGOGASTRODUODENOSCOPY (EGD) WITH PROPOFOL N/A 04/17/2015   Procedure: ESOPHAGOGASTRODUODENOSCOPY (EGD) WITH PROPOFOL;  Surgeon: [name], MD;  Location: [facility];  Service: Endoscopy;  Laterality: N/A;  . EXPLORATORY LAPAROTOMY  2003  . FOOT SURGERY Bilateral 2000   Bone spur removed  . INCISIONAL HERNIA REPAIR N/A 06/09/2014   Procedure: RECURRENT  INCISIONAL HERNIORRHAPHY WITH MESH;  Surgeon: [name], MD;  Location: [facility];  Service: General;  Laterality: N/A;  . INCISIONAL HERNIA REPAIR N/A 10/15/2014   Procedure: [place];  Surgeon: [name], MD;  Location: [place];  Service: General;  Laterality: N/A;  . INSERTION OF MESH N/A 06/09/2014   Procedure: INSERTION OF MESH;  Surgeon: [name], MD;  Location: [facility];  Service: General;  Laterality: N/A;  . INSERTION OF MESH N/A 10/15/2014   Procedure: INSERTION OF MESH;  Surgeon: [name],  MD;  Location: [place];  Service: General;  Laterality: N/A;  . POLYPECTOMY  04/17/2015   Procedure: POLYPECTOMY;  Surgeon: [name], MD;  Location: [facility];  Service: Endoscopy;;  . RESECTION DISTAL CLAVICAL  05/07/2012   Procedure: RESECTION DISTAL CLAVICAL;  Surgeon: [name], MD;  Location: [facility];  Service: Orthopedics;  Laterality: Right;  . SHOULDER OPEN ROTATOR CUFF REPAIR  05/07/2012   Procedure: ROTATOR CUFF REPAIR SHOULDER OPEN;  Surgeon: [name], MD;  Location: [facility];  Service: Orthopedics;  Laterality: Right;  . THYROIDECTOMY  2009  . TONSILLECTOMY   1958  . UMBILICAL HERNIA REPAIR  2010     OB History   None      Home Medications    Prior to Admission medications   Medication Sig Start Date End Date Taking? Authorizing Provider  albuterol (PROVENTIL HFA) 108 (90 BASE) MCG/ACT inhaler Inhale 2 puffs into the lungs every 6 (six) hours as needed for wheezing or shortness of breath. Shortness of breath    [provider]  aspirin EC 81 MG tablet Take 81 mg by mouth daily.    [provider]  CARTIA XT 300 MG 24 hr capsule TAKE ONE (1) CAPSULE EACH DAY 04/19/17   Satira Sark, MD  colesevelam [facility]) 625 MG tablet Take 1,875 mg by mouth 2 (two) times daily with a meal. 3 tabs am 3 tabs pm    [provider]  doxycycline (VIBRA-TABS) 100 MG tablet Take 100 mg by mouth 2 (two) times daily.  03/23/17   [provider]  DULoxetine (CYMBALTA) 30 MG capsule Take 30 mg by mouth daily.  01/18/17   [provider]  DULoxetine (CYMBALTA) 60 MG capsule Take 60 mg by mouth daily.  03/20/15   [provider]  econazole nitrate 1 % cream Apply topically daily. Patient taking differently: Apply 1 application topically daily as needed (for rash irritation).  12/09/15   Tuchman, Richard C, DPM  furosemide (LASIX) 20 MG tablet Take 20 mg by mouth daily as needed for fluid.     [provider]  hydrochlorothiazide (HYDRODIURIL) 25 MG tablet Take 25 mg by mouth daily.  08/16/16   [provider]  HYDROmorphone (DILAUDID) 4 MG tablet Take 4 mg by mouth every 6 (six) hours as needed for severe pain.  08/21/14   [provider]  lisinopril (PRINIVIL,ZESTRIL) 20 MG tablet Take 20 mg by mouth daily.  01/18/17   [provider]  meloxicam (MOBIC) 15 MG tablet Take 15 mg by mouth daily.  10/22/15   [provider]  metFORMIN (GLUCOPHAGE) 1000 MG tablet Take 1,000 mg by mouth 2 (two) times daily with a meal.    [provider]  nystatin (NYSTOP) 100000 UNIT/GM  POWD Apply 100,000 g topically daily as needed (for rash-irritation). Rash 08/18/10   [provider]  nystatin cream (MYCOSTATIN) Apply 1 application topically 3 (three) times daily.  03/23/17   [provider]  pantoprazole (PROTONIX) 40 MG tablet Take 1 tablet (40 mg total) by mouth daily before breakfast. 03/28/17   Rehman, Mechele Dawley, MD  SYNTHROID 300 MCG tablet Take 250 mcg by mouth daily before breakfast.  03/20/15   [provider]  tolterodine (DETROL) 2 MG tablet Take 2 mg by mouth 2 (two) times daily.  08/06/15   [provider]  VOLTAREN 1 % GEL Apply 4 g topically daily as needed (pain). For pain 10/24/12   [provider]    Family History Family History  Problem Relation Age of Onset  . Heart disease Unknown   . Arthritis Unknown   . Cancer Unknown   . Asthma Unknown   . Diabetes Unknown   . Kidney disease Unknown     Social History Social History   Tobacco Use  . Smoking status: Never Smoker  . Smokeless tobacco: Never Used  Substance Use Topics  . Alcohol use: No    Alcohol/week: 0.0 oz  . Drug use: No     Allergies   Doxycycline; Adhesive [tape]; Biaxin [clarithromycin]; Percocet [oxycodone-acetaminophen]; Clarithromycin; Clindamycin/lincomycin; Neosporin [neomycin-polymyxin b gu]; and Penicillins   Review of Systems Review of Systems  Constitutional: Negative for chills and fever.  Cardiovascular: Negative for chest pain.  Gastrointestinal: Negative for abdominal pain, nausea and vomiting.  Musculoskeletal: Positive for arthralgias (right knee pain and swelling) and joint swelling. Negative for back pain and neck pain.  Skin: Negative for color change and wound.  Neurological: Negative for dizziness, syncope, weakness and numbness.  All other systems reviewed and are negative.    Physical Exam Updated Vital Signs BP (!) 126/57   Pulse 84   Temp 98.5 F (36.9 C) (Oral)   Resp 18   Ht 5' 5"  (1.651 m)   Wt  130.6 kg (288 lb)   SpO2 100%   BMI 47.93 kg/m   Physical Exam  Constitutional: She is oriented to person, place, and time. She appears well-developed and well-nourished. No distress.  Pt is obese  HENT:  Head: Atraumatic.  Eyes: Pupils are equal, round, and reactive to light. EOM are normal.  Neck: Normal range of motion.  Cardiovascular: Normal rate, regular rhythm and intact distal pulses.  Pulmonary/Chest: Effort normal and breath sounds normal. She exhibits no tenderness.  Musculoskeletal: She exhibits edema and tenderness. She exhibits no deformity.  Moderate edema and ecchymosis of the anterior right knee.  No step off deformity.  Pt unable to perform ROM of the knee due to level of pain  Neurological: She is alert and oriented to person, place, and time. No sensory deficit.  Skin: Skin is warm. Capillary refill takes less than 2 seconds.  Nursing note and vitals reviewed.    ED Treatments / Results  Labs (all labs ordered are listed, but only abnormal results are displayed) Labs Reviewed - No data to display  EKG None  Radiology Ct Knee Right Wo Contrast  Result Date: 10/04/2017 CLINICAL DATA:  Status post fall.  Right knee pain. EXAM: CT OF THE RIGHT KNEE WITHOUT CONTRAST TECHNIQUE: Multidetector CT imaging of the RIGHT knee was performed according to the standard protocol. Multiplanar CT image reconstructions were also generated. COMPARISON:  None. FINDINGS: Bones/Joint/Cartilage Acute nondisplaced right lateral tibial plateau fracture without significant depression. Fracture involves the articular surface. No other acute fracture or dislocation. Normal alignment. Large joint effusion. Severe joint space narrowing of lateral femorotibial compartment. Moderate medial femorotibial compartment joint space narrowing with marginal osteophytes. Severe lateral patellofemoral compartment joint space narrowing with subchondral cystic changes and marginal osteophytes. No aggressive  osseous lesion. Loose body posterior to the medial femoral condyle. Ligaments Ligaments are suboptimally evaluated by CT. Muscles and Tendons Muscles are normal. Quadriceps tendon and patellar tendon are grossly intact. Soft tissue Soft tissue contusion in the subcutaneous fat of anteromedial knee with a 2.1 x 5.7 x 5.9 cm hematoma anteromedial to the knee. No soft tissue mass. IMPRESSION: 1. Acute nondisplaced right lateral tibial plateau fracture without significant  depression. 2.1 x 5.7 x 5.9 cm hematoma along the anteromedial aspect of the knee within the subcutaneous fat. 2. Tricompartmental osteoarthritis of right knee most severe in the right lateral femorotibial compartment and patellofemoral compartment. Electronically Signed   By: Kathreen Devoid   On: 10/04/2017 13:35   Dg Knee Complete 4 Views Right  Result Date: 10/04/2017 CLINICAL DATA:  Fall today.  Medial and anterior knee pain. EXAM: RIGHT KNEE - COMPLETE 4+ VIEW COMPARISON:  Right knee radiographs 12/07/2008. FINDINGS: The right knee is located. A small effusion is present. There is significant progression of advanced degenerative changes. Degenerative disease is greatest the lateral and patellofemoral compartments. Loose bodies are noted within the joint anteriorly. No acute fractures are present. IMPRESSION: 1. No acute osseous abnormality. 2. Suspect small effusion.  This could be posttraumatic. 3. Significant progression of advanced degenerative changes involving predominantly the lateral and patellofemoral compartments. 4. Loose bodies are noted within the joint. Electronically Signed   By: San Morelle M.D.   On: 10/04/2017 11:16    Procedures Procedures (including critical care time)  Medications Ordered in ED Medications  HYDROcodone-acetaminophen (NORCO/VICODIN) 5-325 MG per tablet 1 tablet (1 tablet Oral Given 10/04/17 1210)     Initial Impression / Assessment and Plan / ED Course  I have reviewed the triage vital  signs and the nursing notes.  Pertinent labs & imaging results that were available during my care of the patient were reviewed by me and considered in my medical decision making (see chart for details).     Consulted Dr. Eli Hose, orthopedist, recommends knee immobilizer and pt to be non-weight bearing.  Suggested that pt be admitted if pt does not have family to care for her at home.   Discussed this with the pt and she prefers not to be admitted.  Does agree to home health.  I have contacted case management, Janett Billow, and she will contact pt to make further arrangements.    Pt has seen Dr. Aline Brochure previously and prefers to follow-up with him.  Knee immobilizer applied.  Pain improved.  Remains NV intact.  Will fax order for walker to [facility].  Return precautions discussed.   Final Clinical Impressions(s) / ED Diagnoses   Final diagnoses:  Closed fracture of right tibial plateau, initial encounter    ED Discharge Orders    None       Kem Parkinson, PA-C 10/04/17 1710    Milton Ferguson, MD 10/05/17 [phone]

## 2017-10-04 NOTE — ED Notes (Signed)
Knee immobilizer applied, pt assisted up to wheelchair and to bathroom.

## 2017-10-04 NOTE — ED Triage Notes (Signed)
Pt was getting out of bathtub and slipped on a towel and fell on right knee.  Swelling noted to right knee with ankle pain as well.  Denies hitting head.

## 2017-10-04 NOTE — ED Notes (Signed)
Pt sitting in wheelchair, leg elevated and iced. Eating and drinking

## 2017-10-04 NOTE — ED Notes (Addendum)
Patient given discharge instruction, verbalized understand. Patient in wheelchair to waiting area, called for a ride.

## 2017-10-06 ENCOUNTER — Emergency Department (HOSPITAL_COMMUNITY)
Admission: EM | Admit: 2017-10-06 | Discharge: 2017-10-07 | Disposition: A | Discharge status: Home / Self Care | Payer: PPO | Attending: Emergency Medicine | Admitting: Emergency Medicine

## 2017-10-06 ENCOUNTER — Encounter (HOSPITAL_COMMUNITY): Payer: Self-pay

## 2017-10-06 DIAGNOSIS — Z7984 Long term (current) use of oral hypoglycemic drugs: Secondary | ICD-10-CM | POA: Insufficient documentation

## 2017-10-06 DIAGNOSIS — R600 Localized edema: Secondary | ICD-10-CM | POA: Diagnosis not present

## 2017-10-06 DIAGNOSIS — M79604 Pain in right leg: Secondary | ICD-10-CM

## 2017-10-06 DIAGNOSIS — J449 Chronic obstructive pulmonary disease, unspecified: Secondary | ICD-10-CM | POA: Diagnosis not present

## 2017-10-06 DIAGNOSIS — I10 Essential (primary) hypertension: Secondary | ICD-10-CM | POA: Insufficient documentation

## 2017-10-06 DIAGNOSIS — E039 Hypothyroidism, unspecified: Secondary | ICD-10-CM | POA: Insufficient documentation

## 2017-10-06 DIAGNOSIS — Z79899 Other long term (current) drug therapy: Secondary | ICD-10-CM | POA: Diagnosis not present

## 2017-10-06 DIAGNOSIS — Z7982 Long term (current) use of aspirin: Secondary | ICD-10-CM | POA: Insufficient documentation

## 2017-10-06 DIAGNOSIS — E119 Type 2 diabetes mellitus without complications: Secondary | ICD-10-CM | POA: Diagnosis not present

## 2017-10-06 DIAGNOSIS — M79661 Pain in right lower leg: Secondary | ICD-10-CM | POA: Diagnosis not present

## 2017-10-06 LAB — COMPREHENSIVE METABOLIC PANEL
ALT: 23 U/L (ref 14–54)
AST: 24 U/L (ref 15–41)
Albumin: 3.6 g/dL (ref 3.5–5.0)
Alkaline Phosphatase: 163 U/L — ABNORMAL HIGH (ref 38–126)
Anion gap: 13 (ref 5–15)
BUN: 11 mg/dL (ref 6–20)
CHLORIDE: 97 mmol/L — AB (ref 101–111)
CO2: 24 mmol/L (ref 22–32)
CREATININE: 0.73 mg/dL (ref 0.44–1.00)
Calcium: 9.3 mg/dL (ref 8.9–10.3)
GFR calc non Af Amer: >60 mL/min (ref >60)
Glucose, Bld: 147 mg/dL — ABNORMAL HIGH (ref 65–99)
Potassium: 4 mmol/L (ref 3.5–5.1)
SODIUM: 134 mmol/L — AB (ref 135–145)
Total Bilirubin: 0.7 mg/dL (ref 0.3–1.2)
Total Protein: 7.5 g/dL (ref 6.5–8.1)

## 2017-10-06 LAB — CBC WITH DIFFERENTIAL/PLATELET
BASOS ABS: 0 10*3/uL (ref 0.0–0.1)
Basophils Relative: 0 %
EOS ABS: 0.2 10*3/uL (ref 0.0–0.7)
EOS PCT: 2 %
HCT: 30.9 % — ABNORMAL LOW (ref 36.0–46.0)
Hemoglobin: 9.3 g/dL — ABNORMAL LOW (ref 12.0–15.0)
Lymphocytes Relative: 18 %
Lymphs Abs: 1.7 10*3/uL (ref 0.7–4.0)
MCH: 23.1 pg — AB (ref 26.0–34.0)
MCHC: 30.1 g/dL (ref 30.0–36.0)
MCV: 76.7 fL — ABNORMAL LOW (ref 78.0–100.0)
Monocytes Absolute: 0.7 10*3/uL (ref 0.1–1.0)
Monocytes Relative: 7 %
Neutro Abs: 6.8 10*3/uL (ref 1.7–7.7)
Neutrophils Relative %: 73 %
PLATELETS: 183 10*3/uL (ref 150–400)
RBC: 4.03 MIL/uL (ref 3.87–5.11)
RDW: 16.9 % — ABNORMAL HIGH (ref 11.5–15.5)
WBC: 9.4 10*3/uL (ref 4.0–10.5)

## 2017-10-06 LAB — D-DIMER, QUANTITATIVE: D-Dimer, Quant: 1.97 ug/mL-FEU — ABNORMAL HIGH (ref 0.00–0.50)

## 2017-10-06 MED ORDER — FENTANYL CITRATE (PF) 100 MCG/2ML IJ SOLN
100.0000 ug | Freq: Once | INTRAMUSCULAR | Status: AC
  Administered 2017-10-06: 100 ug via INTRAVENOUS
  Filled 2017-10-06: qty 2

## 2017-10-06 MED ORDER — ENOXAPARIN SODIUM 150 MG/ML ~~LOC~~ SOLN
1.0000 mg/kg | Freq: Once | SUBCUTANEOUS | Status: AC
  Administered 2017-10-06: 130 mg via SUBCUTANEOUS
  Filled 2017-10-06: qty 1

## 2017-10-06 NOTE — ED Triage Notes (Addendum)
Pt was seen 10/04/17 for right tibial plateau fx. Right leg has increased in swelling and leg is red.areas on leg draining Pt called ortho and not able to be seen until next week. Called Dr Gerarda Fraction and was told to come to ED

## 2017-10-06 NOTE — ED Provider Notes (Signed)
[facility] EMERGENCY DEPARTMENT Provider Note   CSN: [id number] Arrival date & time: 10/06/17  1845     History   Chief Complaint Chief Complaint  Patient presents with  . Leg Pain    HPI [name] [name] is a 62 y.o. female.   Leg Pain   This is a new problem. The current episode started 2 days ago. The problem occurs constantly. The problem has been gradually worsening. The pain is present in the right lower leg. The quality of the pain is described as aching and sharp. The pain is moderate. Associated symptoms include limited range of motion. She has tried nothing for the symptoms. There has been a history of trauma.    Past Medical History:  Diagnosis Date  . Anemia   . Anxiety   . Arthritis   . Asthma   . Cirrhosis of liver ([place])   . COPD (chronic obstructive pulmonary disease) ([place])   . Depression   . Edema   . Essential hypertension, benign   . GERD (gastroesophageal reflux disease)   . Hypothyroidism   . Morbid obesity ([place])   . Sleep apnea    CPAP - not consistent  . Thyroid cancer ([place])   . Type 2 diabetes mellitus [facility])     Patient Active Problem List   Diagnosis Date Noted  . Hepatic cirrhosis ([place]) 09/01/2014  . Cirrhosis, nonalcoholic ([place]) 69/67/8938  . Incisional hernia 06/09/2014  . S/P complete repair of rotator cuff 07/24/2012  . Precordial pain 09/30/2010  . Essential hypertension, benign 09/30/2010  . Type 2 diabetes mellitus ([place]) 09/05/2008  . OBESITY-MORBID (>100') 09/05/2008  . ESOPHAGEAL REFLUX 09/05/2008  . EDEMA 09/05/2008    Past Surgical History:  Procedure Laterality Date  . "pump bumps"     bilateral heels--2000  . ABDOMINAL HYSTERECTOMY  1985  . CARDIAC CATHETERIZATION  2012   Dr. Lia Foyer told her nothing was wrong.  [name] [place]  . CHOLECYSTECTOMY  1996  . COLONOSCOPY WITH PROPOFOL N/A 04/17/2015   Procedure: COLONOSCOPY WITH PROPOFOL  at cecum at 0810; withdrawal time =15 minutes;  Surgeon: [name], MD;  Location: [facility];  Service: Endoscopy;  Laterality: N/A;  . Debridement of abdominal wound  2011  . ESOPHAGEAL DILATION N/A 04/17/2015   Procedure: ESOPHAGEAL DILATION WITH 56FR MALONEY DILATOR;  Surgeon: [name], MD;  Location: [facility];  Service: Endoscopy;  Laterality: N/A;  . ESOPHAGOGASTRODUODENOSCOPY (EGD) WITH PROPOFOL N/A 04/17/2015   Procedure: ESOPHAGOGASTRODUODENOSCOPY (EGD) WITH PROPOFOL;  Surgeon: [name], MD;  Location: [facility];  Service: Endoscopy;  Laterality: N/A;  . EXPLORATORY LAPAROTOMY  2003  . FOOT SURGERY Bilateral 2000   Bone spur removed  . INCISIONAL HERNIA REPAIR N/A 06/09/2014   Procedure: RECURRENT  INCISIONAL HERNIORRHAPHY WITH MESH;  Surgeon: [name], MD;  Location: [facility];  Service: General;  Laterality: N/A;  . INCISIONAL HERNIA REPAIR N/A 10/15/2014   Procedure: [place];  Surgeon: [name], MD;  Location: [place];  Service: General;  Laterality: N/A;  . INSERTION OF MESH N/A 06/09/2014   Procedure: INSERTION OF MESH;  Surgeon: [name], MD;  Location: [facility];  Service: General;  Laterality: N/A;  . INSERTION OF MESH N/A 10/15/2014   Procedure: INSERTION OF MESH;  Surgeon: [name], MD;  Location: [place];  Service: General;  Laterality: N/A;  . POLYPECTOMY  04/17/2015   Procedure: POLYPECTOMY;  Surgeon: [name], MD;  Location: [facility];  Service: Endoscopy;;  . RESECTION DISTAL CLAVICAL  05/07/2012   Procedure: RESECTION DISTAL CLAVICAL;  Surgeon: [name], MD;  Location: [facility];  Service: Orthopedics;  Laterality: Right;  . SHOULDER OPEN ROTATOR CUFF REPAIR  05/07/2012   Procedure: ROTATOR CUFF REPAIR SHOULDER OPEN;  Surgeon: [name], MD;  Location: [facility];  Service: Orthopedics;  Laterality: Right;  . THYROIDECTOMY  2009  . TONSILLECTOMY  1958  . UMBILICAL HERNIA REPAIR  2010     OB History   None      Home Medications    Prior to Admission  medications   Medication Sig Start Date End Date Taking? Authorizing Provider  albuterol (PROVENTIL HFA) 108 (90 BASE) MCG/ACT inhaler Inhale 2 puffs into the lungs every 6 (six) hours as needed for wheezing or shortness of breath. Shortness of breath    [provider]  aspirin EC 81 MG tablet Take 81 mg by mouth daily.    [provider]  CARTIA XT 300 MG 24 hr capsule TAKE ONE (1) CAPSULE EACH DAY 04/19/17   Satira Sark, MD  colesevelam [facility]) 625 MG tablet Take 1,875 mg by mouth 2 (two) times daily with a meal. 3 tabs am 3 tabs pm    [provider]  doxycycline (VIBRA-TABS) 100 MG tablet Take 100 mg by mouth 2 (two) times daily.  03/23/17   [provider]  DULoxetine (CYMBALTA) 30 MG capsule Take 30 mg by mouth daily.  01/18/17   [provider]  DULoxetine (CYMBALTA) 60 MG capsule Take 60 mg by mouth daily.  03/20/15   [provider]  econazole nitrate 1 % cream Apply topically daily. Patient taking differently: Apply 1 application topically daily as needed (for rash irritation).  12/09/15   Tuchman, Richard C, DPM  furosemide (LASIX) 20 MG tablet Take 20 mg by mouth daily as needed for fluid.     [provider]  hydrochlorothiazide (HYDRODIURIL) 25 MG tablet Take 25 mg by mouth daily.  08/16/16   [provider]  HYDROcodone-acetaminophen (NORCO) 7.5-325 MG tablet Take 1 tablet by mouth every 6 (six) hours as needed for moderate pain. 10/04/17   Triplett, Tammy, PA-C  HYDROmorphone (DILAUDID) 4 MG tablet Take 4 mg by mouth every 6 (six) hours as needed for severe pain.  08/21/14   [provider]  lisinopril (PRINIVIL,ZESTRIL) 20 MG tablet Take 20 mg by mouth daily.  01/18/17   [provider]  meloxicam (MOBIC) 15 MG tablet Take 15 mg by mouth daily.  10/22/15   [provider]  metFORMIN (GLUCOPHAGE) 1000 MG tablet Take 1,000 mg by mouth 2 (two) times daily with a meal.    [provider]  nystatin (NYSTOP) 100000 UNIT/GM POWD Apply 100,000 g topically daily as needed (for rash-irritation). Rash 08/18/10   [provider]  nystatin cream (MYCOSTATIN) Apply 1 application topically 3 (three) times daily.  03/23/17   [provider]  pantoprazole (PROTONIX) 40 MG tablet Take 1 tablet (40 mg total) by mouth daily before breakfast. 03/28/17   Rehman, Mechele Dawley, MD  SYNTHROID 300 MCG tablet Take 250 mcg by mouth daily before breakfast.  03/20/15   [provider]  tolterodine (DETROL) 2 MG tablet Take 2 mg by mouth 2 (two) times daily.  08/06/15   [provider]  VOLTAREN 1 % GEL Apply 4 g topically daily as needed (pain). For pain 10/24/12   [provider]    Family History Family History  Problem Relation Age of Onset  . Heart disease Unknown   . Arthritis Unknown   . Cancer Unknown   . Asthma Unknown   . Diabetes Unknown   . Kidney disease Unknown     Social History Social History   Tobacco Use  . Smoking status: Never Smoker  . Smokeless tobacco: Never Used  Substance Use Topics  . Alcohol use: No    Alcohol/week: 0.0 oz  . Drug use: No     Allergies   Doxycycline; Adhesive [tape]; Biaxin [clarithromycin]; Percocet [oxycodone-acetaminophen]; Clarithromycin; Clindamycin/lincomycin; Neosporin [neomycin-polymyxin b gu]; and Penicillins   Review of Systems Review of Systems  All other systems reviewed and are negative.    Physical Exam Updated Vital Signs BP (!) 155/88 (BP Location: Right Arm)   Pulse 98   Temp 98.3 F (36.8 C) (Oral)   Resp 17   SpO2 96%   Physical Exam  Constitutional: She is oriented to person, place, and time. She appears well-developed and well-nourished.  HENT:  Head: Normocephalic and atraumatic.  Eyes: Conjunctivae and EOM are normal.  Neck: Normal range of motion.  Cardiovascular: Normal rate and regular rhythm.  Pulmonary/Chest: No stridor. No respiratory distress.    Abdominal: Soft. She exhibits no distension.  Musculoskeletal: She exhibits edema and tenderness (of rle with some blisters, mild erythema and significant edema. no severe pain with ROM. normal DP pulse on right leg. no paresthesias. no pallor.).  Neurological: She is alert and oriented to person, place, and time. No cranial nerve deficit. Coordination normal.  Skin: Skin is warm and dry.  Nursing note and vitals reviewed.    ED Treatments / Results  Labs (all labs ordered are listed, but only abnormal results are displayed) Labs Reviewed  CBC WITH DIFFERENTIAL/PLATELET - Abnormal; Notable for the following components:      Result Value   Hemoglobin 9.3 (*)    HCT 30.9 (*)    MCV 76.7 (*)    MCH 23.1 (*)    RDW 16.9 (*)    All other components within normal limits  COMPREHENSIVE METABOLIC PANEL - Abnormal; Notable for the following components:   Sodium 134 (*)    Chloride 97 (*)    Glucose, Bld 147 (*)    Alkaline Phosphatase 163 (*)    All other components within normal limits  D-DIMER, QUANTITATIVE (NOT AT [facility]) - Abnormal; Notable for the following components:   D-Dimer, Quant 1.97 (*)    All other components within normal limits    EKG None  Radiology No results found.  Procedures Procedures (including critical care time)  Medications Ordered in ED Medications  fentaNYL (SUBLIMAZE) injection 100 mcg (100 mcg Intravenous Given 10/06/17 2111)  enoxaparin (LOVENOX) injection 130 mg (130 mg Subcutaneous Given 10/06/17 2315)     Initial Impression / Assessment and Plan / ED Course  I have reviewed the triage vital signs and the nursing notes.  Pertinent labs & imaging results that were available during my care of the patient were reviewed by me and considered in my medical decision making (see chart for details).   Patient's compartments are not bradycardic so I doubt that she has compartment syndrome however does have worsening swelling after this tibial plateau  fracture I discussed it with Dr. Lyla Glassing with orthopedics who states that the swelling and blisters are likely from not elevating her foot enough after the fracture.  Her d-dimer was slightly elevated so will  get ultrasound tomorrow, Lovenox given.  Final Clinical Impressions(s) / ED Diagnoses   Final diagnoses:  Right leg pain    ED Discharge Orders        Ordered    US Venous Img Lower Unilateral Right     10/06/17 2244       Chetara Kropp, Corene Cornea, MD 10/06/17 2321

## 2017-10-07 NOTE — ED Notes (Signed)
Patient called ED inquiring about ultrasound that was to be completed this am. Pt reports no answer in radiology or ultrasound. Pt provided contact information. This RN contacted radiology and gave pt contact information to get ultrasound scheduled.

## 2017-10-09 ENCOUNTER — Encounter (HOSPITAL_COMMUNITY): Payer: Self-pay

## 2017-10-09 ENCOUNTER — Ambulatory Visit (HOSPITAL_COMMUNITY)
Admission: RE | Admit: 2017-10-09 | Discharge: 2017-10-09 | Disposition: A | Discharge status: Home / Self Care | Payer: PPO | Source: Ambulatory Visit | Attending: Emergency Medicine | Admitting: Emergency Medicine

## 2017-10-09 DIAGNOSIS — M7989 Other specified soft tissue disorders: Secondary | ICD-10-CM | POA: Diagnosis not present

## 2017-10-09 DIAGNOSIS — S82101A Unspecified fracture of upper end of right tibia, initial encounter for closed fracture: Secondary | ICD-10-CM | POA: Diagnosis not present

## 2017-10-09 DIAGNOSIS — R600 Localized edema: Secondary | ICD-10-CM

## 2017-10-09 NOTE — ED Provider Notes (Signed)
I reported the patient that Doppler imaging of her right leg was negative.  She will follow-up with her PMD   Orlie Dakin, MD 10/09/17 1149

## 2017-10-11 ENCOUNTER — Other Ambulatory Visit: Payer: Self-pay

## 2017-10-11 NOTE — Patient Outreach (Signed)
[facility]) Care Management  10/11/2017  [name] [name] 12/13/1955 [id number]   TELEPHONE SCREENING Referral date: 10/05/17, received from [facility] 10/11/17 Referral source: [facility] utilization management Referral reason: needs assistance with ADL's  Insurance: [employer] advantage  Telephone call to patient regarding utilization management referral. HIPAA verified with patient. Explained reason for call. Patient states she recently broke her right tibia and is concerned about assistance with her care. Patient states she also has cellulitis and her leg is still draining.   Patient states her granddaughter has been assisting her but will be leaving to go back to school.  Patient states she will continue to need assistance with her care. Patient reports she has an appointment with the surgeon on 10/18/17.  Patient states she currently has a brace on her right leg.  States her injury will probably require surgery.  Patient states she has diabetes and high blood pressure. Patient states she is managing this conditions without problem.  RNCM dicussed and offered [facility] care management social work services for [name] assistance. Patient verbally agreed.  RNCM advised patient to notify MD of any changes in condition prior to scheduled appointment. RNCM provided contact name and number: [phone] or main office number [phone] and 24 hour nurse advise line [phone].  RNCM verified patient aware of 911 services for urgent/ emergent needs.  PLAN: RNCM will refer patient to social worker  Quinn Plowman RN,BSN,CCM [facility] Telephonic  [phone]

## 2017-10-13 ENCOUNTER — Other Ambulatory Visit: Payer: Self-pay | Admitting: *Deleted

## 2017-10-13 NOTE — Patient Outreach (Signed)
[facility]) Care Management  10/13/2017  [name] [name] 09-May-1956 [id number]   CSW received referral from [place]. Patient recently broke her right leg from a fall getting out of bathtub and slipped on a towel and subsequently fell on right knee. CSW called & spoke with patient to discuss in-home assistance but patient declined the need at this time stating that she has been getting assistance from her neighbors and friends, and her grandaughter has been on spring break this week and has been helping her out as well. CSW provided patient with phone number to [place] ([facility]) in [facility] she needs assistance in the future.   CSW will sign off at this time as patient declines assistance at this time, but encouraged patient to call if a new need came up.    Raynaldo Opitz, LCSW [facility]  Clinical Social Worker cell #: [phone]

## 2017-10-18 DIAGNOSIS — S82101A Unspecified fracture of upper end of right tibia, initial encounter for closed fracture: Secondary | ICD-10-CM | POA: Diagnosis not present

## 2017-10-25 DIAGNOSIS — Z6841 Body Mass Index (BMI) 40.0 and over, adult: Secondary | ICD-10-CM | POA: Diagnosis not present

## 2017-10-25 DIAGNOSIS — E1142 Type 2 diabetes mellitus with diabetic polyneuropathy: Secondary | ICD-10-CM | POA: Diagnosis not present

## 2017-10-25 DIAGNOSIS — E669 Obesity, unspecified: Secondary | ICD-10-CM | POA: Diagnosis not present

## 2017-10-25 DIAGNOSIS — E063 Autoimmune thyroiditis: Secondary | ICD-10-CM | POA: Diagnosis not present

## 2017-10-25 DIAGNOSIS — E538 Deficiency of other specified B group vitamins: Secondary | ICD-10-CM | POA: Diagnosis not present

## 2017-10-25 DIAGNOSIS — I10 Essential (primary) hypertension: Secondary | ICD-10-CM | POA: Diagnosis not present

## 2017-10-27 DIAGNOSIS — N3281 Overactive bladder: Secondary | ICD-10-CM | POA: Diagnosis not present

## 2017-11-03 DIAGNOSIS — S82001A Unspecified fracture of right patella, initial encounter for closed fracture: Secondary | ICD-10-CM | POA: Diagnosis not present

## 2017-11-15 DIAGNOSIS — S82101A Unspecified fracture of upper end of right tibia, initial encounter for closed fracture: Secondary | ICD-10-CM | POA: Diagnosis not present

## 2017-11-24 NOTE — Progress Notes (Signed)
Cardiology Office Note  Date: 11/27/2017   ID: [name] [name], [name] [name] 15-Apr-1956, MRN [id number]  PCP: Redmond School, MD  Primary Cardiologist: Rozann Lesches, MD   Chief Complaint  Patient presents with  . Palpitations    History of Present Illness: [name] [name] is a 62 y.o. female last seen in May 2018.  She presents for a follow-up visit.  Reports no increasing palpitations, sudden dizziness or syncope.  She did have a fall getting out of her bathtub back in April resulting in right tibial plateau fracture.  She has recuperated significantly since then but still is using a walker and states that she is somewhat "clumsy."  I personally reviewed her ECG today which shows sinus tachycardia with PAC.  I reviewed her medications.  She continues on Cartia XT at 300 mg daily.  Also following with Dr. Gerarda Fraction for routine lab work and maintenance of chronic health conditions.  Past Medical History:  Diagnosis Date  . Anemia   . Anxiety   . Arthritis   . Asthma   . Cirrhosis of liver ([place])   . COPD (chronic obstructive pulmonary disease) ([place])   . Depression   . Edema   . Essential hypertension, benign   . GERD (gastroesophageal reflux disease)   . Hypothyroidism   . Morbid obesity ([place])   . Sleep apnea    CPAP - not consistent  . Thyroid cancer ([place])   . Type 2 diabetes mellitus ([place])     Past Surgical History:  Procedure Laterality Date  . "pump bumps"     bilateral heels--2000  . ABDOMINAL HYSTERECTOMY  1985  . CARDIAC CATHETERIZATION  2012   Dr. Lia Foyer told her nothing was wrong.  [name] [place]  . CHOLECYSTECTOMY  1996  . COLONOSCOPY WITH PROPOFOL N/A 04/17/2015   Procedure: COLONOSCOPY WITH PROPOFOL  at cecum at 0810; withdrawal time =15 minutes;  Surgeon: [name], MD;  Location: [facility];  Service: Endoscopy;  Laterality: N/A;  . Debridement of abdominal wound  2011  . ESOPHAGEAL DILATION N/A 04/17/2015   Procedure: ESOPHAGEAL DILATION WITH  56FR MALONEY DILATOR;  Surgeon: [name], MD;  Location: [facility];  Service: Endoscopy;  Laterality: N/A;  . ESOPHAGOGASTRODUODENOSCOPY (EGD) WITH PROPOFOL N/A 04/17/2015   Procedure: ESOPHAGOGASTRODUODENOSCOPY (EGD) WITH PROPOFOL;  Surgeon: [name], MD;  Location: [facility];  Service: Endoscopy;  Laterality: N/A;  . EXPLORATORY LAPAROTOMY  2003  . FOOT SURGERY Bilateral 2000   Bone spur removed  . INCISIONAL HERNIA REPAIR N/A 06/09/2014   Procedure: RECURRENT  INCISIONAL HERNIORRHAPHY WITH MESH;  Surgeon: [name], MD;  Location: [facility];  Service: General;  Laterality: N/A;  . INCISIONAL HERNIA REPAIR N/A 10/15/2014   Procedure: [place];  Surgeon: [name], MD;  Location: [place];  Service: General;  Laterality: N/A;  . INSERTION OF MESH N/A 06/09/2014   Procedure: INSERTION OF MESH;  Surgeon: [name], MD;  Location: [facility];  Service: General;  Laterality: N/A;  . INSERTION OF MESH N/A 10/15/2014   Procedure: INSERTION OF MESH;  Surgeon: [name], MD;  Location: [place];  Service: General;  Laterality: N/A;  . POLYPECTOMY  04/17/2015   Procedure: POLYPECTOMY;  Surgeon: [name], MD;  Location: [facility];  Service: Endoscopy;;  . RESECTION DISTAL CLAVICAL  05/07/2012   Procedure: RESECTION DISTAL CLAVICAL;  Surgeon: [name], MD;  Location: [facility];  Service: Orthopedics;  Laterality: Right;  . SHOULDER OPEN ROTATOR CUFF REPAIR  05/07/2012   Procedure: ROTATOR CUFF REPAIR SHOULDER OPEN;  Surgeon: [name], MD;  Location: [facility];  Service: Orthopedics;  Laterality: Right;  . THYROIDECTOMY  2009  . TONSILLECTOMY  1958  . UMBILICAL HERNIA REPAIR  2010    Current Outpatient Medications  Medication Sig Dispense Refill  . albuterol (PROVENTIL HFA) 108 (90 BASE) MCG/ACT inhaler Inhale 2 puffs into the lungs every 6 (six) hours as needed for wheezing or shortness of breath. Shortness of breath    . aspirin EC 81  MG tablet Take 81 mg by mouth daily.    [name] CARTIA XT 300 MG 24 hr capsule TAKE ONE (1) CAPSULE EACH DAY 90 capsule 3  . colesevelam (WELCHOL) 625 MG tablet Take 1,875 mg by mouth 2 (two) times daily with a meal. 3 tabs am 3 tabs pm    . DULoxetine (CYMBALTA) 30 MG capsule Take 30 mg by mouth daily.     . DULoxetine (CYMBALTA) 60 MG capsule Take 60 mg by mouth daily.   4  . econazole nitrate 1 % cream Apply topically daily. (Patient taking differently: Apply 1 application topically daily as needed (for rash irritation). ) 85 g 0  . furosemide (LASIX) 20 MG tablet Take 20 mg by mouth daily as needed for fluid.     [name] HYDROcodone-acetaminophen (NORCO) 10-325 MG tablet Take 1 tablet by mouth every hour as needed.    [name] HYDROmorphone (DILAUDID) 4 MG tablet Take 4 mg by mouth every 6 (six) hours as needed for severe pain.   0  . lisinopril (PRINIVIL,ZESTRIL) 20 MG tablet Take 20 mg by mouth daily.     . meloxicam (MOBIC) 15 MG tablet Take 15 mg by mouth daily.     . metFORMIN (GLUCOPHAGE) 1000 MG tablet Take 1,000 mg by mouth 2 (two) times daily with a meal.    . nystatin (NYSTOP) 100000 UNIT/GM POWD Apply 100,000 g topically daily as needed (for rash-irritation). Rash    . nystatin cream (MYCOSTATIN) Apply 1 application topically 3 (three) times daily.     [name] oxybutynin (DITROPAN) 5 MG tablet Take 1 tablet by mouth daily.    . pantoprazole (PROTONIX) 40 MG tablet Take 1 tablet (40 mg total) by mouth daily before breakfast. 90 tablet 3  . SYNTHROID 200 MCG tablet Take 1 tablet by mouth daily.     No current facility-administered medications for this visit.    Allergies:  Doxycycline; Adhesive [tape]; Biaxin [clarithromycin]; Percocet [oxycodone-acetaminophen]; Clarithromycin; Clindamycin/lincomycin; Neosporin [neomycin-polymyxin b gu]; and Penicillins   Social History: The patient  reports that she has never smoked. She has never used smokeless tobacco. She reports that she does not drink alcohol or use  drugs.   ROS:  Please see the history of present illness. Otherwise, complete review of systems is positive for intermittent right knee pain and popping.  All other systems are reviewed and negative.   Physical Exam: VS:  BP (!) 154/70   Pulse 95   Ht 5' 5"  (1.651 m)   Wt 296 lb 12.8 oz (134.6 kg)   SpO2 98%   BMI 49.39 kg/m , BMI Body mass index is 49.39 kg/m.  Wt Readings from Last 3 Encounters:  11/27/17 296 lb 12.8 oz (134.6 kg)  10/04/17 288 lb (130.6 kg)  03/28/17 288 lb 3.2 oz (130.7 kg)    General: Morbidly obese woman, appears comfortable at rest. HEENT: Conjunctiva and lids normal,  oropharynx clear. Neck: Supple, no elevated JVP or carotid bruits, no thyromegaly. Lungs: Clear to auscultation, nonlabored breathing at rest. Cardiac: Regular rate and rhythm, no S3 or significant systolic murmur. Abdomen: Obese with pannus, nontender, bowel sounds present. Extremities: No pitting edema, distal pulses 2+. Skin: Warm and dry. Musculoskeletal: No kyphosis. Neuropsychiatric: Alert and oriented x3, affect grossly appropriate.  ECG: I personally reviewed the tracing from 10/24/2016 which showed sinus tachycardia.  Recent Labwork: 10/06/2017: ALT 23; AST 24; BUN 11; Creatinine, Ser 0.73; Hemoglobin 9.3; Platelets 183; Potassium 4.0; Sodium 134   Other Studies Reviewed Today:  Cardiac catheterization 10/22/2010: HEMODYNAMIC DATA: 1. The central aortic pressure was 140/70, mean 90. 2. LV pressure 141/20. 3. There was no gradient or pullback across aortic valve.  ANGIOGRAPHIC DATA: 1. Ventriculography is done in the RAO projection. Overall systolic function is preserved. No segmental abnormalities contraction were identified. 2. There is minor calcification in the midportion of the left anterior descending artery. 3. The left main is short. It bifurcates into a large LAD and a dominant circumflex vessel. 4. The LAD is noted, has some very mild  calcification in the midvessel. The LAD proximally is a large-caliber vessel without critical disease. There is minimal luminal irregularity in the midportion of the artery. No significant areas of focal obstruction were identified. The LAD has 1 major diagonal branch and several smaller diagonal branches in the apical vessel wraps the apical tip. 5. The circumflex is a fairly large-caliber vessel. It is a dominant vessel. It provides a large bifurcating then trifurcating marginal branch, and then courses posteriorly where provides a posterolateral system in a posterior descending branch. Focal obstruction is not noted. 6. The right coronary artery is a nondominant vessel. It is smooth. There is a tiny PDA branch which probably courses parallel to the larger circumflex branch. There is a small to moderate acute marginal branch as well.  CONCLUSIONS: 1. Well preserved overall left ventricular systolic function. 2. No evidence of high-grade focal coronary obstruction. 3. Minimal calcification of the LAD.  Assessment and Plan:  1.  Palpitations, well controlled on Cartia XT at this time.  ECG reviewed.  No changes made for now.  2.  Essential hypertension, follows with Dr. Gerarda Fraction.  In addition to [facility] XT she is on lisinopril and Lasix.  3.  Previously documented history of mild coronary atherosclerosis, asymptomatic.  Continue aspirin and WelChol.  Current medicines were reviewed with the patient today.   Orders Placed This Encounter  Procedures  . EKG 12-Lead    Disposition: Follow-up in 1 year.  Signed, Satira Sark, MD, [facility] 11/27/2017 1:53 PM    [facility] [address] [place], [place] [address] Phone: [phone]; Fax: [phone]

## 2017-11-27 ENCOUNTER — Ambulatory Visit: Payer: PPO | Admitting: Cardiology

## 2017-11-27 ENCOUNTER — Encounter: Payer: Self-pay | Admitting: *Deleted

## 2017-11-27 VITALS — BP 154/70 | HR 95 | Ht 65.0 in | Wt 296.8 lb

## 2017-11-27 DIAGNOSIS — R002 Palpitations: Secondary | ICD-10-CM | POA: Diagnosis not present

## 2017-11-27 DIAGNOSIS — I10 Essential (primary) hypertension: Secondary | ICD-10-CM

## 2017-11-27 DIAGNOSIS — I2510 Atherosclerotic heart disease of native coronary artery without angina pectoris: Secondary | ICD-10-CM | POA: Diagnosis not present

## 2017-11-27 NOTE — Patient Instructions (Signed)
Medication Instructions:   Your physician recommends that you continue on your current medications as directed. Please refer to the Current Medication list given to you today.  Labwork:  NONE  Testing/Procedures:  NONE  Follow-Up:  Your physician recommends that you schedule a follow-up appointment in: 1 year. You will receive a reminder letter in the mail in about 10 months reminding you to call and schedule your appointment. If you don't receive this letter, please contact our office.  Any Other Special Instructions Will Be Listed Below (If Applicable).  If you need a refill on your cardiac medications before your next appointment, please call your pharmacy.

## 2017-12-04 DIAGNOSIS — S82001A Unspecified fracture of right patella, initial encounter for closed fracture: Secondary | ICD-10-CM | POA: Diagnosis not present

## 2017-12-05 DIAGNOSIS — Z1389 Encounter for screening for other disorder: Secondary | ICD-10-CM | POA: Diagnosis not present

## 2017-12-05 DIAGNOSIS — Z6841 Body Mass Index (BMI) 40.0 and over, adult: Secondary | ICD-10-CM | POA: Diagnosis not present

## 2017-12-05 DIAGNOSIS — M159 Polyosteoarthritis, unspecified: Secondary | ICD-10-CM | POA: Diagnosis not present

## 2017-12-05 DIAGNOSIS — M7541 Impingement syndrome of right shoulder: Secondary | ICD-10-CM | POA: Diagnosis not present

## 2017-12-05 DIAGNOSIS — F329 Major depressive disorder, single episode, unspecified: Secondary | ICD-10-CM | POA: Diagnosis not present

## 2017-12-20 DIAGNOSIS — J329 Chronic sinusitis, unspecified: Secondary | ICD-10-CM | POA: Diagnosis not present

## 2017-12-20 DIAGNOSIS — Z0001 Encounter for general adult medical examination with abnormal findings: Secondary | ICD-10-CM | POA: Diagnosis not present

## 2017-12-20 DIAGNOSIS — E063 Autoimmune thyroiditis: Secondary | ICD-10-CM | POA: Diagnosis not present

## 2017-12-20 DIAGNOSIS — Z6841 Body Mass Index (BMI) 40.0 and over, adult: Secondary | ICD-10-CM | POA: Diagnosis not present

## 2017-12-20 DIAGNOSIS — R5383 Other fatigue: Secondary | ICD-10-CM | POA: Diagnosis not present

## 2017-12-20 DIAGNOSIS — E1142 Type 2 diabetes mellitus with diabetic polyneuropathy: Secondary | ICD-10-CM | POA: Diagnosis not present

## 2017-12-20 DIAGNOSIS — I10 Essential (primary) hypertension: Secondary | ICD-10-CM | POA: Diagnosis not present

## 2017-12-20 DIAGNOSIS — Z1389 Encounter for screening for other disorder: Secondary | ICD-10-CM | POA: Diagnosis not present

## 2017-12-20 DIAGNOSIS — Z0000 Encounter for general adult medical examination without abnormal findings: Secondary | ICD-10-CM | POA: Diagnosis not present

## 2017-12-20 DIAGNOSIS — E669 Obesity, unspecified: Secondary | ICD-10-CM | POA: Diagnosis not present

## 2018-01-01 DIAGNOSIS — K432 Incisional hernia without obstruction or gangrene: Secondary | ICD-10-CM | POA: Diagnosis not present

## 2018-01-03 DIAGNOSIS — S82001A Unspecified fracture of right patella, initial encounter for closed fracture: Secondary | ICD-10-CM | POA: Diagnosis not present

## 2018-01-26 DIAGNOSIS — J069 Acute upper respiratory infection, unspecified: Secondary | ICD-10-CM | POA: Diagnosis not present

## 2018-01-26 DIAGNOSIS — Z6841 Body Mass Index (BMI) 40.0 and over, adult: Secondary | ICD-10-CM | POA: Diagnosis not present

## 2018-02-01 DIAGNOSIS — S82101A Unspecified fracture of upper end of right tibia, initial encounter for closed fracture: Secondary | ICD-10-CM | POA: Diagnosis not present

## 2018-02-01 DIAGNOSIS — M1711 Unilateral primary osteoarthritis, right knee: Secondary | ICD-10-CM | POA: Diagnosis not present

## 2018-02-03 DIAGNOSIS — S82001A Unspecified fracture of right patella, initial encounter for closed fracture: Secondary | ICD-10-CM | POA: Diagnosis not present

## 2018-02-20 DIAGNOSIS — I4891 Unspecified atrial fibrillation: Secondary | ICD-10-CM | POA: Diagnosis not present

## 2018-02-20 DIAGNOSIS — E063 Autoimmune thyroiditis: Secondary | ICD-10-CM | POA: Diagnosis not present

## 2018-02-20 DIAGNOSIS — F419 Anxiety disorder, unspecified: Secondary | ICD-10-CM | POA: Diagnosis not present

## 2018-02-20 DIAGNOSIS — R0789 Other chest pain: Secondary | ICD-10-CM | POA: Diagnosis not present

## 2018-02-20 DIAGNOSIS — M1991 Primary osteoarthritis, unspecified site: Secondary | ICD-10-CM | POA: Diagnosis not present

## 2018-02-20 DIAGNOSIS — B372 Candidiasis of skin and nail: Secondary | ICD-10-CM | POA: Diagnosis not present

## 2018-02-20 DIAGNOSIS — Z6841 Body Mass Index (BMI) 40.0 and over, adult: Secondary | ICD-10-CM | POA: Diagnosis not present

## 2018-02-20 DIAGNOSIS — E538 Deficiency of other specified B group vitamins: Secondary | ICD-10-CM | POA: Diagnosis not present

## 2018-02-20 DIAGNOSIS — I2510 Atherosclerotic heart disease of native coronary artery without angina pectoris: Secondary | ICD-10-CM | POA: Diagnosis not present

## 2018-02-20 DIAGNOSIS — I10 Essential (primary) hypertension: Secondary | ICD-10-CM | POA: Diagnosis not present

## 2018-02-20 DIAGNOSIS — E1140 Type 2 diabetes mellitus with diabetic neuropathy, unspecified: Secondary | ICD-10-CM | POA: Diagnosis not present

## 2018-03-02 DIAGNOSIS — I10 Essential (primary) hypertension: Secondary | ICD-10-CM | POA: Diagnosis not present

## 2018-03-02 DIAGNOSIS — E1140 Type 2 diabetes mellitus with diabetic neuropathy, unspecified: Secondary | ICD-10-CM | POA: Diagnosis not present

## 2018-03-02 DIAGNOSIS — B353 Tinea pedis: Secondary | ICD-10-CM | POA: Diagnosis not present

## 2018-03-02 DIAGNOSIS — M1711 Unilateral primary osteoarthritis, right knee: Secondary | ICD-10-CM | POA: Diagnosis not present

## 2018-03-02 DIAGNOSIS — G894 Chronic pain syndrome: Secondary | ICD-10-CM | POA: Diagnosis not present

## 2018-03-02 DIAGNOSIS — E063 Autoimmune thyroiditis: Secondary | ICD-10-CM | POA: Diagnosis not present

## 2018-03-02 DIAGNOSIS — Z6841 Body Mass Index (BMI) 40.0 and over, adult: Secondary | ICD-10-CM | POA: Diagnosis not present

## 2018-03-02 DIAGNOSIS — R201 Hypoesthesia of skin: Secondary | ICD-10-CM | POA: Diagnosis not present

## 2018-03-02 DIAGNOSIS — E669 Obesity, unspecified: Secondary | ICD-10-CM | POA: Diagnosis not present

## 2018-03-02 DIAGNOSIS — L84 Corns and callosities: Secondary | ICD-10-CM | POA: Diagnosis not present

## 2018-03-06 DIAGNOSIS — S82001A Unspecified fracture of right patella, initial encounter for closed fracture: Secondary | ICD-10-CM | POA: Diagnosis not present

## 2018-03-07 ENCOUNTER — Other Ambulatory Visit (HOSPITAL_COMMUNITY): Payer: Self-pay | Admitting: Internal Medicine

## 2018-03-07 DIAGNOSIS — Z1231 Encounter for screening mammogram for malignant neoplasm of breast: Secondary | ICD-10-CM

## 2018-03-19 DIAGNOSIS — I10 Essential (primary) hypertension: Secondary | ICD-10-CM | POA: Diagnosis not present

## 2018-03-19 DIAGNOSIS — Z6841 Body Mass Index (BMI) 40.0 and over, adult: Secondary | ICD-10-CM | POA: Diagnosis not present

## 2018-03-19 DIAGNOSIS — N342 Other urethritis: Secondary | ICD-10-CM | POA: Diagnosis not present

## 2018-03-19 DIAGNOSIS — E119 Type 2 diabetes mellitus without complications: Secondary | ICD-10-CM | POA: Diagnosis not present

## 2018-03-19 DIAGNOSIS — M1991 Primary osteoarthritis, unspecified site: Secondary | ICD-10-CM | POA: Diagnosis not present

## 2018-03-20 DIAGNOSIS — E119 Type 2 diabetes mellitus without complications: Secondary | ICD-10-CM | POA: Diagnosis not present

## 2018-03-20 DIAGNOSIS — E1140 Type 2 diabetes mellitus with diabetic neuropathy, unspecified: Secondary | ICD-10-CM | POA: Diagnosis not present

## 2018-03-27 ENCOUNTER — Encounter

## 2018-03-27 ENCOUNTER — Ambulatory Visit: Payer: PPO | Admitting: Cardiology

## 2018-03-27 ENCOUNTER — Encounter: Payer: Self-pay | Admitting: Cardiology

## 2018-03-27 VITALS — BP 126/74 | HR 90 | Ht 64.0 in | Wt 304.0 lb

## 2018-03-27 DIAGNOSIS — G4733 Obstructive sleep apnea (adult) (pediatric): Secondary | ICD-10-CM

## 2018-03-27 DIAGNOSIS — I4819 Other persistent atrial fibrillation: Secondary | ICD-10-CM | POA: Diagnosis not present

## 2018-03-27 DIAGNOSIS — I10 Essential (primary) hypertension: Secondary | ICD-10-CM

## 2018-03-27 MED ORDER — RIVAROXABAN 20 MG PO TABS: 20.0000 mg | ORAL_TABLET | Freq: Every day | ORAL | 6 refills | Status: DC

## 2018-03-27 MED ORDER — DILTIAZEM HCL ER COATED BEADS 360 MG PO CP24: 360.0000 mg | ORAL_CAPSULE | Freq: Every day | ORAL | 3 refills | Status: DC

## 2018-03-27 NOTE — Patient Instructions (Signed)
Medication Instructions:  STOP Aspirin     INCREASE Cardia to 360 mg daily   INCREASE Xarelto to 20 mg daily   If you need a refill on your cardiac medications before your next appointment, please call your pharmacy.   Lab work: Cbc,bmet in 6 weeks  If you have labs (blood work) drawn today and your tests are completely normal, you will receive your results only by: [name] MyChart Message (if you have MyChart) OR . A paper copy in the mail If you have any lab test that is abnormal or we need to change your treatment, we will call you to review the results.  Testing/Procedures: NONE  Follow-Up: At [facility], you and your health needs are our priority.  As part of our continuing mission to provide you with exceptional heart care, we have created designated Provider Care Teams.  These Care Teams include your primary Cardiologist (physician) and Advanced Practice Providers (APPs -  Physician Assistants and Nurse Practitioners) who all work together to provide you with the care you need, when you need it. . Follow up Dr.McDowell in 6 weeks  Any Other Special Instructions Will Be Listed Below (If Applicable). NONE

## 2018-03-27 NOTE — Progress Notes (Signed)
Cardiology Office Note  Date: 03/27/2018   ID: [name] [name], [name] [name] 1955/07/26, MRN [id number]  PCP: Redmond School, MD  Primary Cardiologist: Rozann Lesches, MD   Chief Complaint  Patient presents with  . Cardiac follow-up    History of Present Illness: [name] [name] is a 62 y.o. female last seen in June.  She is referred back to the office by Dr. Gerarda Fraction due to interval diagnosis of atrial fibrillation.  She does not report any definite sense of palpitations, breathless more so than usual with activity however.  I do not have the tracing obtained at [facility] which made this diagnosis, however follow-up tracing in the office today shows atrial fibrillation with low voltage and nonspecific T wave changes.  She has not had atrial fibrillation documented by prior tracings.  She was placed on Xarelto 15 mg daily by Dr. Gerarda Fraction.  Based on her last creatinine in April however, she needs to be on the 20 mg dose for stroke prophylaxis.  We discussed atrial fibrillation diagnosis and stroke risk.  Her CHADSVASC score is 3.  At this point it is not entirely clear whether she is having paroxysmal atrial fibrillation or persistent atrial fibrillation.  We discussed increasing Cartia XT to 360 mg daily.  Not clear whether we will need to consider cardioversion or not as I suspect her risk of recurrence would be fairly high in the setting of morbid obesity and OSA with inconsistent CPAP use.  Past Medical History:  Diagnosis Date  . Anemia   . Anxiety   . Arthritis   . Asthma   . Cirrhosis of liver ([place])   . COPD (chronic obstructive pulmonary disease) ([place])   . Depression   . Edema   . Essential hypertension, benign   . GERD (gastroesophageal reflux disease)   . Hypothyroidism   . Morbid obesity ([place])   . Sleep apnea    CPAP - not consistent  . Thyroid cancer ([place])   . Type 2 diabetes mellitus ([place])     Past Surgical History:  Procedure Laterality Date  . "pump bumps"     bilateral  heels--2000  . ABDOMINAL HYSTERECTOMY  1985  . CARDIAC CATHETERIZATION  2012   Dr. Lia Foyer told her nothing was wrong.  [name] [place]  . CHOLECYSTECTOMY  1996  . COLONOSCOPY WITH PROPOFOL N/A 04/17/2015   Procedure: COLONOSCOPY WITH PROPOFOL  at cecum at 0810; withdrawal time =15 minutes;  Surgeon: [name], MD;  [facility];  Service: Endoscopy;  Laterality: N/A;  . Debridement of abdominal wound  2011  . ESOPHAGEAL DILATION N/A 04/17/2015   Procedure: ESOPHAGEAL DILATION WITH 56FR MALONEY DILATOR;  Surgeon: [name], MD;  [facility]: [facility];  Service: Endoscopy;  Laterality: N/A;  . ESOPHAGOGASTRODUODENOSCOPY (EGD) WITH PROPOFOL N/A 04/17/2015   Procedure: ESOPHAGOGASTRODUODENOSCOPY (EGD) WITH PROPOFOL;  Surgeon: [name], MD;  [facility]: [facility];  Service: Endoscopy;  Laterality: N/A;  . EXPLORATORY LAPAROTOMY  2003  . FOOT SURGERY Bilateral 2000   Bone spur removed  . INCISIONAL HERNIA REPAIR N/A 06/09/2014   Procedure: RECURRENT  INCISIONAL HERNIORRHAPHY WITH MESH;  Surgeon: [name], MD;  [facility];  Service: General;  Laterality: N/A;  . INCISIONAL HERNIA REPAIR N/A 10/15/2014   Procedure: [place];  Surgeon: [name], MD;  Location: [place];  Service: General;  Laterality: N/A;  . INSERTION OF MESH N/A 06/09/2014   Procedure: INSERTION OF MESH;  Surgeon: [name], MD;  [facility];  Service: General;  Laterality: N/A;  . INSERTION OF MESH N/A 10/15/2014   Procedure: INSERTION OF MESH;  Surgeon: [name], MD;  Location: [place];  Service: General;  Laterality: N/A;  . POLYPECTOMY  04/17/2015   Procedure: POLYPECTOMY;  Surgeon: [name], MD;  [facility];  Service: Endoscopy;;  . RESECTION DISTAL CLAVICAL  05/07/2012   Procedure: RESECTION DISTAL CLAVICAL;  Surgeon: [name], MD;  [facility];  Service: Orthopedics;  Laterality: Right;  . SHOULDER OPEN ROTATOR  CUFF REPAIR  05/07/2012   Procedure: ROTATOR CUFF REPAIR SHOULDER OPEN;  Surgeon: [name], MD;  [facility];  Service: Orthopedics;  Laterality: Right;  . THYROIDECTOMY  2009  . TONSILLECTOMY  1958  . UMBILICAL HERNIA REPAIR  2010    Current Outpatient Medications  Medication Sig Dispense Refill  . albuterol (PROVENTIL HFA) 108 (90 BASE) MCG/ACT inhaler Inhale 2 puffs into the lungs every 6 (six) hours as needed for wheezing or shortness of breath. Shortness of breath    . colesevelam (WELCHOL) 625 MG tablet Take 1,875 mg by mouth 2 (two) times daily with a meal. 3 tabs am 3 tabs pm    . diltiazem (CARDIZEM CD) 360 MG 24 hr capsule Take 1 capsule (360 mg total) by mouth daily. 90 capsule 3  . DULoxetine (CYMBALTA) 30 MG capsule Take 30 mg by mouth daily.     . DULoxetine (CYMBALTA) 60 MG capsule Take 60 mg by mouth daily.   4  . econazole nitrate 1 % cream Apply topically daily. (Patient taking differently: Apply 1 application topically daily as needed (for rash irritation). ) 85 g 0  . furosemide (LASIX) 20 MG tablet Take 20 mg by mouth daily as needed for fluid.     [name] HYDROcodone-acetaminophen (NORCO) 10-325 MG tablet Take 1 tablet by mouth every hour as needed.    [name] HYDROmorphone (DILAUDID) 4 MG tablet Take 4 mg by mouth every 6 (six) hours as needed for severe pain.   0  . lisinopril (PRINIVIL,ZESTRIL) 20 MG tablet Take 20 mg by mouth daily.     . meloxicam (MOBIC) 15 MG tablet Take 15 mg by mouth daily.     . metFORMIN (GLUCOPHAGE) 1000 MG tablet Take 1,000 mg by mouth 2 (two) times daily with a meal.    . nystatin (NYSTOP) 100000 UNIT/GM POWD Apply 100,000 g topically daily as needed (for rash-irritation). Rash    . nystatin cream (MYCOSTATIN) Apply 1 application topically 3 (three) times daily.     [name] oxybutynin (DITROPAN) 5 MG tablet Take 1 tablet by mouth daily.    . pantoprazole (PROTONIX) 40 MG tablet Take 1 tablet (40 mg total) by mouth daily before breakfast. 90  tablet 3  . SYNTHROID 200 MCG tablet Take 1 tablet by mouth daily.    . rivaroxaban (XARELTO) 20 MG TABS tablet Take 1 tablet (20 mg total) by mouth daily with supper. 30 tablet 6   No current facility-administered medications for this visit.    Allergies:  Doxycycline; Adhesive [tape]; Biaxin [clarithromycin]; Percocet [oxycodone-acetaminophen]; Clarithromycin; Clindamycin/lincomycin; Neosporin [neomycin-polymyxin b gu]; and Penicillins   Social History: The patient  reports that she has never smoked. She has never used smokeless tobacco. She reports that she does not drink alcohol or use drugs.    ROS:  Please see the history of present illness. Otherwise, complete review of systems is positive for chronic  knee and back pain.  All other systems are reviewed and negative.   Physical Exam: VS:  BP 126/74   Pulse 90   Ht 5' 4"  (1.626 m)   Wt (!) 304 lb (137.9 kg)   SpO2 97%   BMI 52.18 kg/m , BMI Body mass index is 52.18 kg/m.  Wt Readings from Last 3 Encounters:  03/27/18 (!) 304 lb (137.9 kg)  11/27/17 296 lb 12.8 oz (134.6 kg)  10/04/17 288 lb (130.6 kg)    General: Morbidly obese woman, appears comfortable at rest. HEENT: Conjunctiva and lids normal, oropharynx clear. Neck: Supple, increased girth without obvious elevated JVP or carotid bruits, no thyromegaly. Lungs: Clear to auscultation, nonlabored breathing at rest. Cardiac: Irregularly irregular, no S3 or significant systolic murmur. Abdomen: Obese, nontender, bowel sounds present. Extremities: No pitting edema, distal pulses 2+. Skin: Warm and dry. Musculoskeletal: No kyphosis. Neuropsychiatric: Alert and oriented x3, affect grossly appropriate.  ECG: I personally reviewed the tracing from 11/27/2017 which showed sinus tachycardia with PAC.  Recent Labwork: 10/06/2017: ALT 23; AST 24; BUN 11; Creatinine, Ser 0.73; Hemoglobin 9.3; Platelets 183; Potassium 4.0; Sodium 134   Other Studies Reviewed Today:  Cardiac  catheterization 10/22/2010: HEMODYNAMIC DATA: 1. The central aortic pressure was 140/70, mean 90. 2. LV pressure 141/20. 3. There was no gradient or pullback across aortic valve.  ANGIOGRAPHIC DATA: 1. Ventriculography is done in the RAO projection. Overall systolic function is preserved. No segmental abnormalities contraction were identified. 2. There is minor calcification in the midportion of the left anterior descending artery. 3. The left main is short. It bifurcates into a large LAD and a dominant circumflex vessel. 4. The LAD is noted, has some very mild calcification in the midvessel. The LAD proximally is a large-caliber vessel without critical disease. There is minimal luminal irregularity in the midportion of the artery. No significant areas of focal obstruction were identified. The LAD has 1 major diagonal branch and several smaller diagonal branches in the apical vessel wraps the apical tip. 5. The circumflex is a fairly large-caliber vessel. It is a dominant vessel. It provides a large bifurcating then trifurcating marginal branch, and then courses posteriorly where provides a posterolateral system in a posterior descending branch. Focal obstruction is not noted. 6. The right coronary artery is a nondominant vessel. It is smooth. There is a tiny PDA branch which probably courses parallel to the larger circumflex branch. There is a small to moderate acute marginal branch as well.  CONCLUSIONS: 1. Well preserved overall left ventricular systolic function. 2. No evidence of high-grade focal coronary obstruction. 3. Minimal calcification of the LAD.  Assessment and Plan:  1.  Persistent atrial fibrillation, diagnosed since our last visit and present on ECG today. CHADSVASC score is 3.  Agree with initiation of anticoagulation, but would increase Xarelto to 20 mg daily.  Stop aspirin.   Increase Cartia XT to 360 mg daily for better heart rate control.  We will plan to see her back over the next 6 weeks to see how she is doing symptomatically.  Also follow-up CBC and BMET at that time.  We will plan to get an echocardiogram at that point as well.  2.  Essential hypertension, blood pressure is well controlled today.  3.  OSA with inconsistent CPAP use.  4.  Morbid obesity.  Current medicines were reviewed with the patient today.   Orders Placed This Encounter  Procedures  . CBC  . Basic Metabolic Panel (BMET)  . EKG  12-Lead    Disposition: Follow-up in 6 weeks.  Signed, Satira Sark, MD, [facility] 03/27/2018 2:49 PM    [place] at [facility] [address] [place], [place] [address] Phone: [phone]; Fax: [phone]

## 2018-03-29 DIAGNOSIS — I4891 Unspecified atrial fibrillation: Secondary | ICD-10-CM | POA: Diagnosis not present

## 2018-03-29 DIAGNOSIS — Z1389 Encounter for screening for other disorder: Secondary | ICD-10-CM | POA: Diagnosis not present

## 2018-03-29 DIAGNOSIS — B355 Tinea imbricata: Secondary | ICD-10-CM | POA: Diagnosis not present

## 2018-03-29 DIAGNOSIS — Z6841 Body Mass Index (BMI) 40.0 and over, adult: Secondary | ICD-10-CM | POA: Diagnosis not present

## 2018-04-05 DIAGNOSIS — S82001A Unspecified fracture of right patella, initial encounter for closed fracture: Secondary | ICD-10-CM | POA: Diagnosis not present

## 2018-04-09 ENCOUNTER — Telehealth: Payer: Self-pay | Admitting: Cardiology

## 2018-04-09 NOTE — Telephone Encounter (Signed)
Pt will hold tonight's dose of xarelto, call me in the morning and we will switch her to eliquis

## 2018-04-09 NOTE — Telephone Encounter (Signed)
Patient states that she has been itching since starting Xarelto. Questioning if that is a side effect. / tg

## 2018-04-09 NOTE — Telephone Encounter (Signed)
I will forward to Dr.McDowell for dispo. 

## 2018-04-09 NOTE — Telephone Encounter (Signed)
It could be a side effect of Xarelto, so I would have her stop the medication to see if symptoms resolve.  If this is the case, we could consider using Eliquis 5 mg twice daily instead.

## 2018-04-10 MED ORDER — APIXABAN 5 MG PO TABS: 5.0000 mg | ORAL_TABLET | Freq: Two times a day (BID) | ORAL | 6 refills | Status: DC

## 2018-04-10 NOTE — Telephone Encounter (Signed)
Pt states itching has nearly stopped after stopping Xarelto.Will e-scribe Eliquis 5 mg BID, along with 30 day FREE Trial card info sent to [facility] pharmacy, pt aware

## 2018-04-10 NOTE — Addendum Note (Signed)
Addended by: Barbarann Ehlers A on: 04/10/2018 11:11 AM   Modules accepted: Orders

## 2018-04-25 ENCOUNTER — Telehealth: Payer: Self-pay | Admitting: Cardiology

## 2018-04-25 NOTE — Telephone Encounter (Signed)
Please call [facility] clinical pharmacist for [facility] for a medication review @ [phone] reference case ID #[id number]

## 2018-04-26 NOTE — Telephone Encounter (Signed)
Called number, got a voicemail. Left message to return call.

## 2018-04-27 DIAGNOSIS — E039 Hypothyroidism, unspecified: Secondary | ICD-10-CM | POA: Diagnosis not present

## 2018-04-27 DIAGNOSIS — I10 Essential (primary) hypertension: Secondary | ICD-10-CM | POA: Diagnosis not present

## 2018-04-27 DIAGNOSIS — M1991 Primary osteoarthritis, unspecified site: Secondary | ICD-10-CM | POA: Diagnosis not present

## 2018-04-27 DIAGNOSIS — R600 Localized edema: Secondary | ICD-10-CM | POA: Diagnosis not present

## 2018-04-27 DIAGNOSIS — Z6841 Body Mass Index (BMI) 40.0 and over, adult: Secondary | ICD-10-CM | POA: Diagnosis not present

## 2018-04-27 DIAGNOSIS — R5383 Other fatigue: Secondary | ICD-10-CM | POA: Diagnosis not present

## 2018-04-27 DIAGNOSIS — Z23 Encounter for immunization: Secondary | ICD-10-CM | POA: Diagnosis not present

## 2018-05-01 NOTE — Telephone Encounter (Signed)
Medication review complete

## 2018-05-06 DIAGNOSIS — S82001A Unspecified fracture of right patella, initial encounter for closed fracture: Secondary | ICD-10-CM | POA: Diagnosis not present

## 2018-05-08 ENCOUNTER — Other Ambulatory Visit (HOSPITAL_COMMUNITY)
Admission: RE | Admit: 2018-05-08 | Discharge: 2018-05-08 | Disposition: A | Discharge status: Home / Self Care | Payer: PPO | Source: Ambulatory Visit | Attending: Cardiology | Admitting: Cardiology

## 2018-05-08 ENCOUNTER — Encounter: Payer: Self-pay | Admitting: Cardiology

## 2018-05-08 DIAGNOSIS — Z7901 Long term (current) use of anticoagulants: Secondary | ICD-10-CM | POA: Insufficient documentation

## 2018-05-08 DIAGNOSIS — J449 Chronic obstructive pulmonary disease, unspecified: Secondary | ICD-10-CM | POA: Insufficient documentation

## 2018-05-08 DIAGNOSIS — E039 Hypothyroidism, unspecified: Secondary | ICD-10-CM | POA: Diagnosis not present

## 2018-05-08 DIAGNOSIS — G4733 Obstructive sleep apnea (adult) (pediatric): Secondary | ICD-10-CM | POA: Insufficient documentation

## 2018-05-08 DIAGNOSIS — Z79890 Hormone replacement therapy (postmenopausal): Secondary | ICD-10-CM | POA: Insufficient documentation

## 2018-05-08 DIAGNOSIS — K219 Gastro-esophageal reflux disease without esophagitis: Secondary | ICD-10-CM | POA: Insufficient documentation

## 2018-05-08 DIAGNOSIS — Z79899 Other long term (current) drug therapy: Secondary | ICD-10-CM | POA: Diagnosis not present

## 2018-05-08 DIAGNOSIS — I10 Essential (primary) hypertension: Secondary | ICD-10-CM | POA: Insufficient documentation

## 2018-05-08 DIAGNOSIS — M7989 Other specified soft tissue disorders: Secondary | ICD-10-CM | POA: Insufficient documentation

## 2018-05-08 DIAGNOSIS — I4821 Permanent atrial fibrillation: Secondary | ICD-10-CM | POA: Insufficient documentation

## 2018-05-08 DIAGNOSIS — E119 Type 2 diabetes mellitus without complications: Secondary | ICD-10-CM | POA: Insufficient documentation

## 2018-05-08 DIAGNOSIS — M1990 Unspecified osteoarthritis, unspecified site: Secondary | ICD-10-CM | POA: Insufficient documentation

## 2018-05-08 DIAGNOSIS — F329 Major depressive disorder, single episode, unspecified: Secondary | ICD-10-CM | POA: Diagnosis not present

## 2018-05-08 DIAGNOSIS — K7460 Unspecified cirrhosis of liver: Secondary | ICD-10-CM | POA: Insufficient documentation

## 2018-05-08 DIAGNOSIS — Z7984 Long term (current) use of oral hypoglycemic drugs: Secondary | ICD-10-CM | POA: Insufficient documentation

## 2018-05-08 DIAGNOSIS — I2510 Atherosclerotic heart disease of native coronary artery without angina pectoris: Secondary | ICD-10-CM | POA: Insufficient documentation

## 2018-05-08 DIAGNOSIS — F419 Anxiety disorder, unspecified: Secondary | ICD-10-CM | POA: Diagnosis not present

## 2018-05-08 DIAGNOSIS — G4730 Sleep apnea, unspecified: Secondary | ICD-10-CM | POA: Diagnosis not present

## 2018-05-08 LAB — BASIC METABOLIC PANEL
ANION GAP: 11 (ref 5–15)
BUN: 8 mg/dL (ref 8–23)
CALCIUM: 9 mg/dL (ref 8.9–10.3)
CO2: 24 mmol/L (ref 22–32)
Chloride: 97 mmol/L — ABNORMAL LOW (ref 98–111)
Creatinine, Ser: 0.82 mg/dL (ref 0.44–1.00)
GFR calc Af Amer: >60 mL/min (ref >60)
Glucose, Bld: 446 mg/dL — ABNORMAL HIGH (ref 70–99)
POTASSIUM: 4 mmol/L (ref 3.5–5.1)
Sodium: 132 mmol/L — ABNORMAL LOW (ref 135–145)

## 2018-05-08 LAB — CBC
HCT: 32.9 % — ABNORMAL LOW (ref 36.0–46.0)
HEMOGLOBIN: 9.4 g/dL — AB (ref 12.0–15.0)
MCH: 23.7 pg — AB (ref 26.0–34.0)
MCHC: 28.6 g/dL — AB (ref 30.0–36.0)
MCV: 83.1 fL (ref 80.0–100.0)
Platelets: 166 10*3/uL (ref 150–400)
RBC: 3.96 MIL/uL (ref 3.87–5.11)
RDW: 15.5 % (ref 11.5–15.5)
WBC: 6.7 10*3/uL (ref 4.0–10.5)
nRBC: 0 % (ref 0.0–0.2)

## 2018-05-08 NOTE — Progress Notes (Signed)
Cardiology Office Note  Date: 05/09/2018   ID: [name] [name] [birth year], MRN [id number]  PCP: Redmond School, MD  Primary Cardiologist: Rozann Lesches, MD   Chief Complaint  Patient presents with  . Atrial Fibrillation    History of Present Illness: [name] [name] is a 62 y.o. female last seen in October.  She presents for a routine follow-up visit.  Reports no palpitations or chest pain.  CHADSVASC score is 3.  She was continued on Xarelto although dose increased to 20 mg at last visit based on renal function.  She has since been switched from Xarelto to Eliquis (had been complaining of itching while on Xarelto, resolved when it was stopped). I reviewed her recent lab work which is outlined below.  She was aware of anemia noted on lab work.  Currently following with PCP, states she had a colonoscopy a few years ago.  We also discussed her elevated glucose.  Cardiac regimen now includes Cardizem CD 360 mg daily along with Eliquis.  Past Medical History:  Diagnosis Date  . Anemia   . Anxiety   . Arthritis   . Asthma   . Atrial fibrillation ([place])   . Cirrhosis of liver ([place])   . COPD (chronic obstructive pulmonary disease) ([place])   . Depression   . Edema   . Essential hypertension, benign   . GERD (gastroesophageal reflux disease)   . Hypothyroidism   . Morbid obesity ([place])   . Sleep apnea    CPAP - not consistent  . Thyroid cancer ([place])   . Type 2 diabetes mellitus ([place])     Past Surgical History:  Procedure Laterality Date  . "pump bumps"     bilateral heels--2000  . ABDOMINAL HYSTERECTOMY  1985  . CARDIAC CATHETERIZATION  2012   Dr. Lia Foyer told her nothing was wrong.  [name] [place]  . CHOLECYSTECTOMY  1996  . COLONOSCOPY WITH PROPOFOL N/A 04/17/2015   Procedure: COLONOSCOPY WITH PROPOFOL  at cecum at 0810; withdrawal time =15 minutes;  Surgeon: [name], MD;  [facility];  Service: Endoscopy;  Laterality: N/A;  . Debridement  of abdominal wound  2011  . ESOPHAGEAL DILATION N/A 04/17/2015   Procedure: ESOPHAGEAL DILATION WITH 56FR MALONEY DILATOR;  Surgeon: [name], MD;  [facility];  Service: Endoscopy;  Laterality: N/A;  . ESOPHAGOGASTRODUODENOSCOPY (EGD) WITH PROPOFOL N/A 04/17/2015   Procedure: ESOPHAGOGASTRODUODENOSCOPY (EGD) WITH PROPOFOL;  Surgeon: [name], MD;  [facility];  Service: Endoscopy;  Laterality: N/A;  . EXPLORATORY LAPAROTOMY  2003  . FOOT SURGERY Bilateral 2000   Bone spur removed  . INCISIONAL HERNIA REPAIR N/A 06/09/2014   Procedure: RECURRENT  INCISIONAL HERNIORRHAPHY WITH MESH;  Surgeon: [name], MD;  Location[facility];  Service: General;  Laterality: N/A;  . INCISIONAL HERNIA REPAIR N/A 10/15/2014   Procedure: [place];  Surgeon: [name], MD;  Location: [place];  Service: General;  Laterality: N/A;  . INSERTION OF MESH N/A 06/09/2014   Procedure: INSERTION OF MESH;  Surgeon: [name], MD;  [facility];  Service: General;  Laterality: N/A;  . INSERTION OF MESH N/A 10/15/2014   Procedure: INSERTION OF MESH;  Surgeon: [name], MD;  Location: [place];  Service: General;  Laterality: N/A;  . POLYPECTOMY  04/17/2015   Procedure: POLYPECTOMY;  Surgeon: [name], MD;  [facility];  Service: Endoscopy;;  . RESECTION DISTAL CLAVICAL  05/07/2012   Procedure: RESECTION DISTAL CLAVICAL;  Surgeon: [name], MD;  [facility];  Service: Orthopedics;  Laterality: Right;  . SHOULDER OPEN ROTATOR CUFF REPAIR  05/07/2012   Procedure: ROTATOR CUFF REPAIR SHOULDER OPEN;  Surgeon: [name], MD;  [facility]: [facility];  Service: Orthopedics;  Laterality: Right;  . THYROIDECTOMY  2009  . TONSILLECTOMY  1958  . UMBILICAL HERNIA REPAIR  2010    Current Outpatient Medications  Medication Sig Dispense Refill  . albuterol (PROVENTIL HFA) 108 (90 BASE) MCG/ACT inhaler Inhale 2 puffs into the lungs  every 6 (six) hours as needed for wheezing or shortness of breath. Shortness of breath    . apixaban (ELIQUIS) 5 MG TABS tablet Take 1 tablet (5 mg total) by mouth 2 (two) times daily. 60 tablet 6  . colesevelam (WELCHOL) 625 MG tablet Take 1,875 mg by mouth 2 (two) times daily with a meal. 3 tabs am 3 tabs pm    . diltiazem (CARDIZEM CD) 360 MG 24 hr capsule Take 1 capsule (360 mg total) by mouth daily. 90 capsule 3  . DULoxetine (CYMBALTA) 30 MG capsule Take 30 mg by mouth daily.     . DULoxetine (CYMBALTA) 60 MG capsule Take 60 mg by mouth daily.   4  . econazole nitrate 1 % cream Apply topically daily. (Patient taking differently: Apply 1 application topically daily as needed (for rash irritation). ) 85 g 0  . furosemide (LASIX) 20 MG tablet Take 20 mg by mouth daily as needed for fluid.     [name] HYDROcodone-acetaminophen (NORCO) 10-325 MG tablet Take 1 tablet by mouth every hour as needed.    [name] HYDROmorphone (DILAUDID) 4 MG tablet Take 4 mg by mouth every 6 (six) hours as needed for severe pain.   0  . levothyroxine (SYNTHROID, LEVOTHROID) 50 MCG tablet Take 50 mcg by mouth daily before breakfast. Add 50 mcg for total of 250 mcg per day    . lisinopril (PRINIVIL,ZESTRIL) 20 MG tablet Take 20 mg by mouth daily.     [name] losartan (COZAAR) 100 MG tablet Take 100 mg by mouth daily.     . meloxicam (MOBIC) 15 MG tablet Take 15 mg by mouth daily.     . metFORMIN (GLUCOPHAGE) 1000 MG tablet Take 1,000 mg by mouth 2 (two) times daily with a meal.    . nystatin (NYSTOP) 100000 UNIT/GM POWD Apply 100,000 g topically daily as needed (for rash-irritation). Rash    . nystatin cream (MYCOSTATIN) Apply 1 application topically 3 (three) times daily.     [name] oxybutynin (DITROPAN) 5 MG tablet Take 1 tablet by mouth daily.    . pantoprazole (PROTONIX) 40 MG tablet Take 1 tablet (40 mg total) by mouth daily before breakfast. 90 tablet 3  . potassium chloride SA (K-DUR,KLOR-CON) 20 MEQ tablet Take 20 mEq by mouth 2 (two)  times daily.     [name] SYNTHROID 200 MCG tablet Take 1 tablet by mouth daily.     No current facility-administered medications for this visit.    Allergies:  Doxycycline; Adhesive [tape]; Biaxin [clarithromycin]; Percocet [oxycodone-acetaminophen]; Xarelto [rivaroxaban]; Clarithromycin; Clindamycin/lincomycin; Neosporin [neomycin-polymyxin b gu]; and Penicillins   Social History: The patient  reports that she has never smoked. She has never used smokeless tobacco. She reports that she does not drink alcohol or use drugs.   ROS:  Please see the history of present illness. Otherwise, complete review of systems is positive for chronic leg swelling and skin  ulcerations/excoriation.  All other systems are reviewed and negative.   Physical Exam: VS:  BP 118/76   Pulse 84   Ht 5' 5"  (1.651 m)   Wt (!) 304 lb (137.9 kg)   SpO2 98%   BMI 50.59 kg/m , BMI Body mass index is 50.59 kg/m.  Wt Readings from Last 3 Encounters:  05/09/18 (!) 304 lb (137.9 kg)  03/27/18 (!) 304 lb (137.9 kg)  11/27/17 296 lb 12.8 oz (134.6 kg)    General: Morbidly obese woman, appears comfortable at rest. HEENT: Conjunctiva and lids normal, oropharynx clear. Neck: Supple, no elevated JVP or carotid bruits, no thyromegaly. Lungs: Clear to auscultation, nonlabored breathing at rest. Cardiac: Irregularly irregular, no S3 or significant systolic murmur. Abdomen: Obese with pannus, nontender, bowel sounds present. Extremities: Lower extremity edema with diffuse skin excoriations and ulcerations, distal pulses 2+. Skin: Warm and dry. Musculoskeletal: No kyphosis. Neuropsychiatric: Alert and oriented x3, affect grossly appropriate.  ECG: I personally reviewed the tracing from 03/27/2018 which shows atrial fibrillation with low voltage and nonspecific T wave changes.  Recent Labwork: 10/06/2017: ALT 23; AST 24 05/08/2018: BUN 8; Creatinine, Ser 0.82; Hemoglobin 9.4; Platelets 166; Potassium 4.0; Sodium 132   Other  Studies Reviewed Today:  Cardiac catheterization 10/22/2010: HEMODYNAMIC DATA: 1. The central aortic pressure was 140/70, mean 90. 2. LV pressure 141/20. 3. There was no gradient or pullback across aortic valve.  ANGIOGRAPHIC DATA: 1. Ventriculography is done in the RAO projection. Overall systolic function is preserved. No segmental abnormalities contraction were identified. 2. There is minor calcification in the midportion of the left anterior descending artery. 3. The left main is short. It bifurcates into a large LAD and a dominant circumflex vessel. 4. The LAD is noted, has some very mild calcification in the midvessel. The LAD proximally is a large-caliber vessel without critical disease. There is minimal luminal irregularity in the midportion of the artery. No significant areas of focal obstruction were identified. The LAD has 1 major diagonal branch and several smaller diagonal branches in the apical vessel wraps the apical tip. 5. The circumflex is a fairly large-caliber vessel. It is a dominant vessel. It provides a large bifurcating then trifurcating marginal branch, and then courses posteriorly where provides a posterolateral system in a posterior descending branch. Focal obstruction is not noted. 6. The right coronary artery is a nondominant vessel. It is smooth. There is a tiny PDA branch which probably courses parallel to the larger circumflex branch. There is a small to moderate acute marginal branch as well.  CONCLUSIONS: 1. Well preserved overall left ventricular systolic function. 2. No evidence of high-grade focal coronary obstruction. 3. Minimal calcification of the LAD.  Assessment and Plan:  1.  Permanent atrial fibrillation.  We will continue strategy of heart rate control and anticoagulation, she does not report any palpitations.  Heart rate control is better after  increasing Cardizem CD to 360 mg daily.  Continue Eliquis which she is tolerating better than Xarelto in terms of previous itching.  Follow-up CBC and BMET for next visit.  2.  Essential hypertension, blood pressure is well controlled today.  3.  OSA, not consistent with CPAP.  This along with morbid obesity, hypertension, and type 2 diabetes mellitus all increase her risk of recurring atrial arrhythmias.  4.  Lower edema and skin ulcerations.  She is currently on Lasix.  I asked her to follow-up with her PCP, could give consideration to management in a wound clinic.  Current medicines were  reviewed with the patient today.   Orders Placed This Encounter  Procedures  . CBC  . Basic Metabolic Panel (BMET)    Disposition: Follow-up in 6 months.  Signed, Satira Sark, MD, [facility] 05/09/2018 9:02 AM    [place] at [facility] [address], [place], [place] [address] Phone: [phone]; Fax: [phone]

## 2018-05-09 ENCOUNTER — Ambulatory Visit: Payer: PPO | Admitting: Cardiology

## 2018-05-09 ENCOUNTER — Encounter: Payer: Self-pay | Admitting: Cardiology

## 2018-05-09 VITALS — BP 118/76 | HR 84 | Ht 65.0 in | Wt 304.0 lb

## 2018-05-09 DIAGNOSIS — M7989 Other specified soft tissue disorders: Secondary | ICD-10-CM

## 2018-05-09 DIAGNOSIS — I4821 Permanent atrial fibrillation: Secondary | ICD-10-CM

## 2018-05-09 DIAGNOSIS — I10 Essential (primary) hypertension: Secondary | ICD-10-CM | POA: Diagnosis not present

## 2018-05-09 DIAGNOSIS — G4733 Obstructive sleep apnea (adult) (pediatric): Secondary | ICD-10-CM

## 2018-05-09 NOTE — Patient Instructions (Signed)
Medication Instructions:  Your physician recommends that you continue on your current medications as directed. Please refer to the Current Medication list given to you today.  If you need a refill on your cardiac medications before your next appointment, please call your pharmacy.   Lab work: Psychologist, occupational, bmet JUST BEFORE NEXT VISIT If you have labs (blood work) drawn today and your tests are completely normal, you will receive your results only by: [name] MyChart Message (if you have MyChart) OR . A paper copy in the mail If you have any lab test that is abnormal or we need to change your treatment, we will call you to review the results.  Testing/Procedures: none  Follow-Up: At [facility], you and your health needs are our priority.  As part of our continuing mission to provide you with exceptional heart care, we have created designated Provider Care Teams.  These Care Teams include your primary Cardiologist (physician) and Advanced Practice Providers (APPs -  Physician Assistants and Nurse Practitioners) who all work together to provide you with the care you need, when you need it. . 6 months follow up with Dr.McDowell  Any Other Special Instructions Will Be Listed Below (If Applicable). none

## 2018-05-15 DIAGNOSIS — Z6841 Body Mass Index (BMI) 40.0 and over, adult: Secondary | ICD-10-CM | POA: Diagnosis not present

## 2018-05-15 DIAGNOSIS — R32 Unspecified urinary incontinence: Secondary | ICD-10-CM | POA: Diagnosis not present

## 2018-05-15 DIAGNOSIS — R600 Localized edema: Secondary | ICD-10-CM | POA: Diagnosis not present

## 2018-05-15 DIAGNOSIS — L0390 Cellulitis, unspecified: Secondary | ICD-10-CM | POA: Diagnosis not present

## 2018-05-15 DIAGNOSIS — I872 Venous insufficiency (chronic) (peripheral): Secondary | ICD-10-CM | POA: Diagnosis not present

## 2018-05-15 DIAGNOSIS — I4891 Unspecified atrial fibrillation: Secondary | ICD-10-CM | POA: Diagnosis not present

## 2018-05-26 ENCOUNTER — Encounter (HOSPITAL_COMMUNITY): Payer: Self-pay | Admitting: *Deleted

## 2018-05-26 ENCOUNTER — Emergency Department (HOSPITAL_COMMUNITY)
Admission: EM | Admit: 2018-05-26 | Discharge: 2018-05-26 | Disposition: A | Discharge status: Home / Self Care | Payer: PPO | Attending: Emergency Medicine | Admitting: Emergency Medicine

## 2018-05-26 ENCOUNTER — Other Ambulatory Visit: Payer: Self-pay

## 2018-05-26 DIAGNOSIS — S40022A Contusion of left upper arm, initial encounter: Secondary | ICD-10-CM | POA: Diagnosis not present

## 2018-05-26 DIAGNOSIS — R233 Spontaneous ecchymoses: Secondary | ICD-10-CM | POA: Insufficient documentation

## 2018-05-26 DIAGNOSIS — Z79899 Other long term (current) drug therapy: Secondary | ICD-10-CM | POA: Diagnosis not present

## 2018-05-26 DIAGNOSIS — E119 Type 2 diabetes mellitus without complications: Secondary | ICD-10-CM | POA: Diagnosis not present

## 2018-05-26 DIAGNOSIS — R58 Hemorrhage, not elsewhere classified: Secondary | ICD-10-CM

## 2018-05-26 DIAGNOSIS — I10 Essential (primary) hypertension: Secondary | ICD-10-CM | POA: Diagnosis not present

## 2018-05-26 DIAGNOSIS — L03116 Cellulitis of left lower limb: Secondary | ICD-10-CM | POA: Insufficient documentation

## 2018-05-26 DIAGNOSIS — R2232 Localized swelling, mass and lump, left upper limb: Secondary | ICD-10-CM | POA: Diagnosis present

## 2018-05-26 MED ORDER — SULFAMETHOXAZOLE-TRIMETHOPRIM 800-160 MG PO TABS: 1.0000 | ORAL_TABLET | Freq: Two times a day (BID) | ORAL | 0 refills | Status: AC

## 2018-05-26 MED ORDER — SULFAMETHOXAZOLE-TRIMETHOPRIM 800-160 MG PO TABS
1.0000 | ORAL_TABLET | Freq: Once | ORAL | Status: AC
  Administered 2018-05-26: 1 via ORAL
  Filled 2018-05-26: qty 1

## 2018-05-26 NOTE — Discharge Instructions (Addendum)
You have ecchymosis or commonly called a black and blue area on your upper arm.  This is not dangerous and will ultimately go away over time.  Additionally, you have a skin infection called cellulitis on your leg.  Antibiotic for same.  Try to keep your legs extremely clean with daily shower.

## 2018-05-26 NOTE — ED Provider Notes (Signed)
[facility] EMERGENCY DEPARTMENT Provider Note   CSN: [id number] Arrival date & time: 05/26/18  1050     History   Chief Complaint Chief Complaint  Patient presents with  . Arm Swelling    HPI [name] [name] is a 62 y.o. female.  Patient has 2 concerns: Ecchymosis in the left upper extremity without trauma and worsening skin infection in her left lower leg.  Patient is currently on Xarelto.  No fever, sweats, chills, chest pain, dyspnea.  She had been on erythromycin for her leg cellulitis, but not now.  Severity of symptoms is mild.     Past Medical History:  Diagnosis Date  . Anemia   . Anxiety   . Arthritis   . Asthma   . Atrial fibrillation ([place])   . Cirrhosis of liver ([place])   . COPD (chronic obstructive pulmonary disease) ([place])   . Depression   . Edema   . Essential hypertension, benign   . GERD (gastroesophageal reflux disease)   . Hypothyroidism   . Morbid obesity ([place])   . Sleep apnea    CPAP - not consistent  . Thyroid cancer ([place])   . Type 2 diabetes mellitus [facility])     Patient Active Problem List   Diagnosis Date Noted  . Hepatic cirrhosis ([place]) 09/01/2014  . Cirrhosis, nonalcoholic ([place]) 23/53/6144  . Incisional hernia 06/09/2014  . S/P complete repair of rotator cuff 07/24/2012  . Precordial pain 09/30/2010  . Essential hypertension, benign 09/30/2010  . Type 2 diabetes mellitus ([place]) 09/05/2008  . OBESITY-MORBID (>100') 09/05/2008  . ESOPHAGEAL REFLUX 09/05/2008  . EDEMA 09/05/2008    Past Surgical History:  Procedure Laterality Date  . "pump bumps"     bilateral heels--2000  . ABDOMINAL HYSTERECTOMY  1985  . CARDIAC CATHETERIZATION  2012   Dr. Lia Foyer told her nothing was wrong.  [name] [place]  . CHOLECYSTECTOMY  1996  . COLONOSCOPY WITH PROPOFOL N/A 04/17/2015   Procedure: COLONOSCOPY WITH PROPOFOL  at cecum at 0810; withdrawal time =15 minutes;  Surgeon: [name], MD;  Location: [facility];  Service: Endoscopy;   Laterality: N/A;  . Debridement of abdominal wound  2011  . ESOPHAGEAL DILATION N/A 04/17/2015   Procedure: ESOPHAGEAL DILATION WITH 56FR MALONEY DILATOR;  Surgeon: [name], MD;  Location: [facility];  Service: Endoscopy;  Laterality: N/A;  . ESOPHAGOGASTRODUODENOSCOPY (EGD) WITH PROPOFOL N/A 04/17/2015   Procedure: ESOPHAGOGASTRODUODENOSCOPY (EGD) WITH PROPOFOL;  Surgeon: [name], MD;  Location: [facility];  Service: Endoscopy;  Laterality: N/A;  . EXPLORATORY LAPAROTOMY  2003  . FOOT SURGERY Bilateral 2000   Bone spur removed  . INCISIONAL HERNIA REPAIR N/A 06/09/2014   Procedure: RECURRENT  INCISIONAL HERNIORRHAPHY WITH MESH;  Surgeon: [name], MD;  Location: [facility];  Service: General;  Laterality: N/A;  . INCISIONAL HERNIA REPAIR N/A 10/15/2014   Procedure: [place];  Surgeon: [name], MD;  Location: [place];  Service: General;  Laterality: N/A;  . INSERTION OF MESH N/A 06/09/2014   Procedure: INSERTION OF MESH;  Surgeon: [name], MD;  Location: [facility];  Service: General;  Laterality: N/A;  . INSERTION OF MESH N/A 10/15/2014   Procedure: INSERTION OF MESH;  Surgeon: [name], MD;  Location: [place];  Service: General;  Laterality: N/A;  . POLYPECTOMY  04/17/2015   Procedure: POLYPECTOMY;  Surgeon: [name], MD;  Location: [facility];  Service: Endoscopy;;  . RESECTION DISTAL  CLAVICAL  05/07/2012   Procedure: RESECTION DISTAL CLAVICAL;  Surgeon: [name], MD;  Location: [facility];  Service: Orthopedics;  Laterality: Right;  . SHOULDER OPEN ROTATOR CUFF REPAIR  05/07/2012   Procedure: ROTATOR CUFF REPAIR SHOULDER OPEN;  Surgeon: [name], MD;  Location: [facility];  Service: Orthopedics;  Laterality: Right;  . THYROIDECTOMY  2009  . TONSILLECTOMY  1958  . UMBILICAL HERNIA REPAIR  2010     OB History   None      Home Medications    Prior to Admission medications   Medication Sig Start Date End Date  Taking? Authorizing Provider  albuterol (PROVENTIL HFA) 108 (90 BASE) MCG/ACT inhaler Inhale 2 puffs into the lungs every 6 (six) hours as needed for wheezing or shortness of breath. Shortness of breath    [provider]  apixaban (ELIQUIS) 5 MG TABS tablet Take 1 tablet (5 mg total) by mouth 2 (two) times daily. 04/10/18   Satira Sark, MD  colesevelam [facility]) 625 MG tablet Take 1,875 mg by mouth 2 (two) times daily with a meal. 3 tabs am 3 tabs pm    [provider]  diltiazem (CARDIZEM CD) 360 MG 24 hr capsule Take 1 capsule (360 mg total) by mouth daily. 03/27/18   Satira Sark, MD  DULoxetine (CYMBALTA) 30 MG capsule Take 30 mg by mouth daily.  01/18/17   [provider]  DULoxetine (CYMBALTA) 60 MG capsule Take 60 mg by mouth daily.  03/20/15   [provider]  econazole nitrate 1 % cream Apply topically daily. Patient taking differently: Apply 1 application topically daily as needed (for rash irritation).  12/09/15   Tuchman, Richard C, DPM  furosemide (LASIX) 20 MG tablet Take 20 mg by mouth daily as needed for fluid.     [provider]  HYDROcodone-acetaminophen (NORCO) 10-325 MG tablet Take 1 tablet by mouth every hour as needed.    [provider]  HYDROmorphone (DILAUDID) 4 MG tablet Take 4 mg by mouth every 6 (six) hours as needed for severe pain.  08/21/14   [provider]  levothyroxine (SYNTHROID, LEVOTHROID) 50 MCG tablet Take 50 mcg by mouth daily before breakfast. Add 50 mcg for total of 250 mcg per day    [provider]  lisinopril (PRINIVIL,ZESTRIL) 20 MG tablet Take 20 mg by mouth daily.  01/18/17   [provider]  losartan (COZAAR) 100 MG tablet Take 100 mg by mouth daily.  04/27/18   [provider]  meloxicam (MOBIC) 15 MG tablet Take 15 mg by mouth daily.  10/22/15   [provider]  metFORMIN (GLUCOPHAGE) 1000 MG tablet Take 1,000 mg by mouth 2 (two) times daily with a  meal.    [provider]  nystatin (NYSTOP) 100000 UNIT/GM POWD Apply 100,000 g topically daily as needed (for rash-irritation). Rash 08/18/10   [provider]  nystatin cream (MYCOSTATIN) Apply 1 application topically 3 (three) times daily.  03/23/17   [provider]  oxybutynin (DITROPAN) 5 MG tablet Take 1 tablet by mouth daily.    [provider]  pantoprazole (PROTONIX) 40 MG tablet Take 1 tablet (40 mg total) by mouth daily before breakfast. 03/28/17   Rehman, Mechele Dawley, MD  potassium chloride SA (K-DUR,KLOR-CON) 20 MEQ tablet Take 20 mEq by mouth 2 (two) times daily.  04/27/18   [provider]  sulfamethoxazole-trimethoprim (BACTRIM DS,SEPTRA DS) 800-160 MG tablet Take 1 tablet by mouth 2 (  two) times daily for 7 days. 05/26/18 06/02/18  Nat Christen, MD  SYNTHROID 200 MCG tablet Take 1 tablet by mouth daily. 11/16/17   [provider]    Family History Family History  Problem Relation Age of Onset  . Heart disease Unknown   . Arthritis Unknown   . Cancer Unknown   . Asthma Unknown   . Diabetes Unknown   . Kidney disease Unknown     Social History Social History   Tobacco Use  . Smoking status: Never Smoker  . Smokeless tobacco: Never Used  Substance Use Topics  . Alcohol use: No    Alcohol/week: 0.0 standard drinks  . Drug use: No     Allergies   Doxycycline; Adhesive [tape]; Biaxin [clarithromycin]; Percocet [oxycodone-acetaminophen]; Xarelto [rivaroxaban]; Clarithromycin; Clindamycin/lincomycin; Neosporin [neomycin-polymyxin b gu]; and Penicillins   Review of Systems Review of Systems  All other systems reviewed and are negative.    Physical Exam Updated Vital Signs BP (!) 162/72 (BP Location: Right Arm)   Pulse 72   Temp 97.6 F (36.4 C)   Resp 18   Ht 5' 5"  (1.651 m)   Wt (!) 138.3 kg   SpO2 96%   BMI 50.75 kg/m   Physical Exam  Constitutional: She is oriented to person, place, and time.  Obese, nad   HENT:  Head: Normocephalic and atraumatic.  Eyes: Conjunctivae are normal.  Neck: Neck supple.  Cardiovascular: Normal rate and regular rhythm.  Pulmonary/Chest: Effort normal and breath sounds normal.  Abdominal: Soft. Bowel sounds are normal.  Musculoskeletal: Normal range of motion.  Neurological: She is alert and oriented to person, place, and time.  Skin:  Left upper extremity: Area of ecchymosis approximately 12 cm in diameter in the mid humerus area.  Left lower extremity: Area of erythema and excoriations in the mid and distal aspect of the tib-fib.  Psychiatric: She has a normal mood and affect. Her behavior is normal.  Nursing note and vitals reviewed.    ED Treatments / Results  Labs (all labs ordered are listed, but only abnormal results are displayed) Labs Reviewed - No data to display  EKG None  Radiology No results found.  Procedures Procedures (including critical care time)  Medications Ordered in ED Medications  sulfamethoxazole-trimethoprim (BACTRIM DS,SEPTRA DS) 800-160 MG per tablet 1 tablet (1 tablet Oral Given 05/26/18 1600)     Initial Impression / Assessment and Plan / ED Course  I have reviewed the triage vital signs and the nursing notes.  Pertinent labs & imaging results that were available during my care of the patient were reviewed by me and considered in my medical decision making (see chart for details).     Patient presents with cellulitis in the left lower extremity.  She is currently not on antibiotics.  Will start Septra DS.  Discussed her ecchymosis on the left upper extremity.  She will follow-up with her primary care doctor.  Final Clinical Impressions(s) / ED Diagnoses   Final diagnoses:  Ecchymosis  Cellulitis of left leg    ED Discharge Orders         Ordered    sulfamethoxazole-trimethoprim (BACTRIM DS,SEPTRA DS) 800-160 MG tablet  2 times daily     05/26/18 1603           Nat Christen, MD 05/26/18 1622

## 2018-05-26 NOTE — ED Triage Notes (Signed)
Left upper arm swelling and discolored, states she take  eloquis

## 2018-05-29 DIAGNOSIS — I872 Venous insufficiency (chronic) (peripheral): Secondary | ICD-10-CM | POA: Diagnosis not present

## 2018-05-29 DIAGNOSIS — R600 Localized edema: Secondary | ICD-10-CM | POA: Diagnosis not present

## 2018-05-29 DIAGNOSIS — Z6841 Body Mass Index (BMI) 40.0 and over, adult: Secondary | ICD-10-CM | POA: Diagnosis not present

## 2018-05-29 DIAGNOSIS — E1140 Type 2 diabetes mellitus with diabetic neuropathy, unspecified: Secondary | ICD-10-CM | POA: Diagnosis not present

## 2018-06-05 DIAGNOSIS — S82001A Unspecified fracture of right patella, initial encounter for closed fracture: Secondary | ICD-10-CM | POA: Diagnosis not present

## 2018-06-27 DIAGNOSIS — E1140 Type 2 diabetes mellitus with diabetic neuropathy, unspecified: Secondary | ICD-10-CM | POA: Diagnosis not present

## 2018-06-27 DIAGNOSIS — Z6841 Body Mass Index (BMI) 40.0 and over, adult: Secondary | ICD-10-CM | POA: Diagnosis not present

## 2018-06-27 DIAGNOSIS — T50905A Adverse effect of unspecified drugs, medicaments and biological substances, initial encounter: Secondary | ICD-10-CM | POA: Diagnosis not present

## 2018-06-27 DIAGNOSIS — E669 Obesity, unspecified: Secondary | ICD-10-CM | POA: Diagnosis not present

## 2018-06-27 DIAGNOSIS — I4891 Unspecified atrial fibrillation: Secondary | ICD-10-CM | POA: Diagnosis not present

## 2018-06-27 DIAGNOSIS — I890 Lymphedema, not elsewhere classified: Secondary | ICD-10-CM | POA: Diagnosis not present

## 2018-06-27 DIAGNOSIS — E538 Deficiency of other specified B group vitamins: Secondary | ICD-10-CM | POA: Diagnosis not present

## 2018-06-27 DIAGNOSIS — E1165 Type 2 diabetes mellitus with hyperglycemia: Secondary | ICD-10-CM | POA: Diagnosis not present

## 2018-06-27 DIAGNOSIS — F321 Major depressive disorder, single episode, moderate: Secondary | ICD-10-CM | POA: Diagnosis not present

## 2018-07-04 ENCOUNTER — Telehealth: Payer: Self-pay | Admitting: Cardiology

## 2018-07-04 NOTE — Telephone Encounter (Signed)
Pt is having a reaction to the Eliquis and she's needing some samples because she's having a hard time getting her ins to cover the Pradaxa.

## 2018-07-04 NOTE — Telephone Encounter (Signed)
Returned pt call. She asked if we have samples of Pradaxa. I advised her that we do not have any samples of pradaxa. She statedd that Dr. Gerarda Fraction took her off eliquis due to an allergic reaction. She will call Dr. Gerarda Fraction in the morning.

## 2018-07-06 DIAGNOSIS — S82001A Unspecified fracture of right patella, initial encounter for closed fracture: Secondary | ICD-10-CM | POA: Diagnosis not present

## 2018-07-10 ENCOUNTER — Telehealth: Payer: Self-pay | Admitting: Cardiology

## 2018-07-10 NOTE — Telephone Encounter (Signed)
Patient is still itching  PCP wanting to start her on Pradaxa but insurance is an issue.  Faxing over office notes -srs

## 2018-07-11 NOTE — Telephone Encounter (Signed)
Noted.  She was originally on Xarelto, although this was stopped due to itching which subsequently resolved.  She was then placed on Eliquis and per records is having the same problem with itching.  Not unreasonable to try Pradaxa in this case (started by Dr. Gerarda Fraction), she may need to have an exemption form completed for the insurance company if she tolerates it. Only other choice would be Coumadin.

## 2018-07-11 NOTE — Telephone Encounter (Signed)
Returned pt call. She stated that Dr. Nolon Rod office has been the ones dealing with insurance company. She does not want to take coumadin due to the food restrictions as she is a "[employer]." She will update [place] once she knows something.

## 2018-07-11 NOTE — Telephone Encounter (Signed)
Returned pt call. She stated that her PCP wanted to switch her from eliquis to pradaxa due to an allergic reaction. She states she is currently waiting on Pradaxa to be filled by pharmacy. She is not sure how long the process will take as the insurance company is not agreeing to pay for it. Is there another medication she could try. Please advise.  FYI: Pcp faxed notes over, placed on Dr. Myles Gip desk for review.

## 2018-07-24 DIAGNOSIS — G894 Chronic pain syndrome: Secondary | ICD-10-CM | POA: Diagnosis not present

## 2018-07-24 DIAGNOSIS — M793 Panniculitis, unspecified: Secondary | ICD-10-CM | POA: Diagnosis not present

## 2018-07-24 DIAGNOSIS — E1140 Type 2 diabetes mellitus with diabetic neuropathy, unspecified: Secondary | ICD-10-CM | POA: Diagnosis not present

## 2018-07-24 DIAGNOSIS — H109 Unspecified conjunctivitis: Secondary | ICD-10-CM | POA: Diagnosis not present

## 2018-07-24 DIAGNOSIS — H01006 Unspecified blepharitis left eye, unspecified eyelid: Secondary | ICD-10-CM | POA: Diagnosis not present

## 2018-07-24 DIAGNOSIS — L0100 Impetigo, unspecified: Secondary | ICD-10-CM | POA: Diagnosis not present

## 2018-07-24 DIAGNOSIS — E538 Deficiency of other specified B group vitamins: Secondary | ICD-10-CM | POA: Diagnosis not present

## 2018-07-24 DIAGNOSIS — Z6841 Body Mass Index (BMI) 40.0 and over, adult: Secondary | ICD-10-CM | POA: Diagnosis not present

## 2018-08-06 DIAGNOSIS — S82001A Unspecified fracture of right patella, initial encounter for closed fracture: Secondary | ICD-10-CM | POA: Diagnosis not present

## 2018-08-11 DIAGNOSIS — R5381 Other malaise: Secondary | ICD-10-CM | POA: Diagnosis not present

## 2018-08-11 DIAGNOSIS — T1490XA Injury, unspecified, initial encounter: Secondary | ICD-10-CM | POA: Diagnosis not present

## 2018-08-14 DIAGNOSIS — E1142 Type 2 diabetes mellitus with diabetic polyneuropathy: Secondary | ICD-10-CM | POA: Diagnosis not present

## 2018-08-14 DIAGNOSIS — Z6841 Body Mass Index (BMI) 40.0 and over, adult: Secondary | ICD-10-CM | POA: Diagnosis not present

## 2018-08-14 DIAGNOSIS — E7849 Other hyperlipidemia: Secondary | ICD-10-CM | POA: Diagnosis not present

## 2018-08-14 DIAGNOSIS — I10 Essential (primary) hypertension: Secondary | ICD-10-CM | POA: Diagnosis not present

## 2018-08-14 DIAGNOSIS — E538 Deficiency of other specified B group vitamins: Secondary | ICD-10-CM | POA: Diagnosis not present

## 2018-08-14 DIAGNOSIS — I4891 Unspecified atrial fibrillation: Secondary | ICD-10-CM | POA: Diagnosis not present

## 2018-08-14 DIAGNOSIS — Z1389 Encounter for screening for other disorder: Secondary | ICD-10-CM | POA: Diagnosis not present

## 2018-08-14 DIAGNOSIS — L03311 Cellulitis of abdominal wall: Secondary | ICD-10-CM | POA: Diagnosis not present

## 2018-08-14 DIAGNOSIS — R32 Unspecified urinary incontinence: Secondary | ICD-10-CM | POA: Diagnosis not present

## 2018-08-14 DIAGNOSIS — I872 Venous insufficiency (chronic) (peripheral): Secondary | ICD-10-CM | POA: Diagnosis not present

## 2018-08-14 DIAGNOSIS — Z0001 Encounter for general adult medical examination with abnormal findings: Secondary | ICD-10-CM | POA: Diagnosis not present

## 2018-08-14 DIAGNOSIS — M793 Panniculitis, unspecified: Secondary | ICD-10-CM | POA: Diagnosis not present

## 2018-09-04 DIAGNOSIS — S82001A Unspecified fracture of right patella, initial encounter for closed fracture: Secondary | ICD-10-CM | POA: Diagnosis not present

## 2018-09-21 DIAGNOSIS — R0600 Dyspnea, unspecified: Secondary | ICD-10-CM | POA: Diagnosis not present

## 2018-09-21 DIAGNOSIS — E119 Type 2 diabetes mellitus without complications: Secondary | ICD-10-CM | POA: Diagnosis not present

## 2018-09-21 DIAGNOSIS — E063 Autoimmune thyroiditis: Secondary | ICD-10-CM | POA: Diagnosis not present

## 2018-09-21 DIAGNOSIS — M791 Myalgia, unspecified site: Secondary | ICD-10-CM | POA: Diagnosis not present

## 2018-09-21 DIAGNOSIS — Q245 Malformation of coronary vessels: Secondary | ICD-10-CM | POA: Diagnosis not present

## 2018-09-21 DIAGNOSIS — L0109 Other impetigo: Secondary | ICD-10-CM | POA: Diagnosis not present

## 2018-09-21 DIAGNOSIS — S066X0A Traumatic subarachnoid hemorrhage without loss of consciousness, initial encounter: Secondary | ICD-10-CM | POA: Diagnosis not present

## 2018-09-21 DIAGNOSIS — S32038A Other fracture of third lumbar vertebra, initial encounter for closed fracture: Secondary | ICD-10-CM | POA: Diagnosis not present

## 2018-09-21 DIAGNOSIS — I739 Peripheral vascular disease, unspecified: Secondary | ICD-10-CM | POA: Diagnosis not present

## 2018-09-21 DIAGNOSIS — S32010A Wedge compression fracture of first lumbar vertebra, initial encounter for closed fracture: Secondary | ICD-10-CM | POA: Diagnosis not present

## 2018-09-21 DIAGNOSIS — I2510 Atherosclerotic heart disease of native coronary artery without angina pectoris: Secondary | ICD-10-CM | POA: Diagnosis not present

## 2018-09-21 DIAGNOSIS — E1065 Type 1 diabetes mellitus with hyperglycemia: Secondary | ICD-10-CM | POA: Diagnosis not present

## 2018-09-21 DIAGNOSIS — E103513 Type 1 diabetes mellitus with proliferative diabetic retinopathy with macular edema, bilateral: Secondary | ICD-10-CM | POA: Diagnosis not present

## 2018-09-21 DIAGNOSIS — S32020A Wedge compression fracture of second lumbar vertebra, initial encounter for closed fracture: Secondary | ICD-10-CM | POA: Diagnosis not present

## 2018-09-21 DIAGNOSIS — Z6821 Body mass index (BMI) 21.0-21.9, adult: Secondary | ICD-10-CM | POA: Diagnosis not present

## 2018-09-21 DIAGNOSIS — R5383 Other fatigue: Secondary | ICD-10-CM | POA: Diagnosis not present

## 2018-09-21 DIAGNOSIS — E538 Deficiency of other specified B group vitamins: Secondary | ICD-10-CM | POA: Diagnosis not present

## 2018-09-21 DIAGNOSIS — Z79899 Other long term (current) drug therapy: Secondary | ICD-10-CM | POA: Diagnosis not present

## 2018-09-21 DIAGNOSIS — Z6841 Body Mass Index (BMI) 40.0 and over, adult: Secondary | ICD-10-CM | POA: Diagnosis not present

## 2018-09-21 DIAGNOSIS — Z0000 Encounter for general adult medical examination without abnormal findings: Secondary | ICD-10-CM | POA: Diagnosis not present

## 2018-09-21 DIAGNOSIS — J329 Chronic sinusitis, unspecified: Secondary | ICD-10-CM | POA: Diagnosis not present

## 2018-09-21 DIAGNOSIS — R0789 Other chest pain: Secondary | ICD-10-CM | POA: Diagnosis not present

## 2018-09-21 DIAGNOSIS — R079 Chest pain, unspecified: Secondary | ICD-10-CM | POA: Diagnosis not present

## 2018-09-21 DIAGNOSIS — D469 Myelodysplastic syndrome, unspecified: Secondary | ICD-10-CM | POA: Diagnosis not present

## 2018-09-21 DIAGNOSIS — R0602 Shortness of breath: Secondary | ICD-10-CM | POA: Diagnosis not present

## 2018-09-21 DIAGNOSIS — I10 Essential (primary) hypertension: Secondary | ICD-10-CM | POA: Diagnosis not present

## 2018-09-21 DIAGNOSIS — I2602 Saddle embolus of pulmonary artery with acute cor pulmonale: Secondary | ICD-10-CM | POA: Diagnosis not present

## 2018-09-21 DIAGNOSIS — S065X0A Traumatic subdural hemorrhage without loss of consciousness, initial encounter: Secondary | ICD-10-CM | POA: Diagnosis not present

## 2018-09-21 DIAGNOSIS — Z008 Encounter for other general examination: Secondary | ICD-10-CM | POA: Diagnosis not present

## 2018-09-21 DIAGNOSIS — L209 Atopic dermatitis, unspecified: Secondary | ICD-10-CM | POA: Diagnosis not present

## 2018-09-21 DIAGNOSIS — D472 Monoclonal gammopathy: Secondary | ICD-10-CM | POA: Diagnosis not present

## 2018-09-21 DIAGNOSIS — I4821 Permanent atrial fibrillation: Secondary | ICD-10-CM | POA: Diagnosis not present

## 2018-09-21 DIAGNOSIS — F321 Major depressive disorder, single episode, moderate: Secondary | ICD-10-CM | POA: Diagnosis not present

## 2018-09-21 DIAGNOSIS — R790 Abnormal level of blood mineral: Secondary | ICD-10-CM | POA: Diagnosis not present

## 2018-09-21 DIAGNOSIS — Z1159 Encounter for screening for other viral diseases: Secondary | ICD-10-CM | POA: Diagnosis not present

## 2018-09-21 DIAGNOSIS — Z9641 Presence of insulin pump (external) (internal): Secondary | ICD-10-CM | POA: Diagnosis not present

## 2018-09-21 DIAGNOSIS — R05 Cough: Secondary | ICD-10-CM | POA: Diagnosis not present

## 2018-10-05 DIAGNOSIS — S82001A Unspecified fracture of right patella, initial encounter for closed fracture: Secondary | ICD-10-CM | POA: Diagnosis not present

## 2018-10-24 DIAGNOSIS — F329 Major depressive disorder, single episode, unspecified: Secondary | ICD-10-CM | POA: Diagnosis not present

## 2018-10-24 DIAGNOSIS — G43009 Migraine without aura, not intractable, without status migrainosus: Secondary | ICD-10-CM | POA: Diagnosis not present

## 2018-10-24 DIAGNOSIS — D510 Vitamin B12 deficiency anemia due to intrinsic factor deficiency: Secondary | ICD-10-CM | POA: Diagnosis not present

## 2018-10-24 DIAGNOSIS — D511 Vitamin B12 deficiency anemia due to selective vitamin B12 malabsorption with proteinuria: Secondary | ICD-10-CM | POA: Diagnosis not present

## 2018-10-24 DIAGNOSIS — Z6841 Body Mass Index (BMI) 40.0 and over, adult: Secondary | ICD-10-CM | POA: Diagnosis not present

## 2018-10-24 DIAGNOSIS — Z1389 Encounter for screening for other disorder: Secondary | ICD-10-CM | POA: Diagnosis not present

## 2018-11-04 DIAGNOSIS — S82001A Unspecified fracture of right patella, initial encounter for closed fracture: Secondary | ICD-10-CM | POA: Diagnosis not present

## 2018-11-05 NOTE — Progress Notes (Deleted)
{Choose 1 Note Type (Telehealth Visit or Telephone Visit):[phone]}   Date:  11/05/2018   ID:  [name] [name], DOB 1956/03/28, MRN [id number]  {Patient Location:[phone]::"Home"} {Provider Location:[phone]::"Home"}  PCP:  Redmond School, MD  Cardiologist:  Rozann Lesches, MD  Evaluation Performed:  {Choose Visit KKXF:[id number]::"RCVELF-YB Visit"}  Chief Complaint:  ***  History of Present Illness:    [name] [name] is a 63 y.o. female last seen in November 2019.  She is due for follow-up lab work on [place].  The patient {does/does not:200015} have symptoms concerning for COVID-19 infection (fever, chills, cough, or new shortness of breath).    Past Medical History:  Diagnosis Date  . Anemia   . Anxiety   . Arthritis   . Asthma   . Atrial fibrillation ([place])   . Cirrhosis of liver ([place])   . COPD (chronic obstructive pulmonary disease) ([place])   . Depression   . Edema   . Essential hypertension, benign   . GERD (gastroesophageal reflux disease)   . Hypothyroidism   . Morbid obesity ([place])   . Sleep apnea    CPAP - not consistent  . Thyroid cancer ([place])   . Type 2 diabetes mellitus ([place])    Past Surgical History:  Procedure Laterality Date  . "pump bumps"     bilateral heels--2000  . ABDOMINAL HYSTERECTOMY  1985  . CARDIAC CATHETERIZATION  2012   Dr. Lia Foyer told her nothing was wrong.  [name] [place]  . CHOLECYSTECTOMY  1996  . COLONOSCOPY WITH PROPOFOL N/A 04/17/2015   Procedure: COLONOSCOPY WITH PROPOFOL  at cecum at 0810; withdrawal time =15 minutes;  Surgeon: [name], MD;  Location[facility];  Service: Endoscopy;  Laterality: N/A;  . Debridement of abdominal wound  2011  . ESOPHAGEAL DILATION N/A 04/17/2015   Procedure: ESOPHAGEAL DILATION WITH 56FR MALONEY DILATOR;  Surgeon: [name], MD;  Location: [facility];  Service: Endoscopy;  Laterality: N/A;  . ESOPHAGOGASTRODUODENOSCOPY (EGD) WITH PROPOFOL N/A 04/17/2015   Procedure:  ESOPHAGOGASTRODUODENOSCOPY (EGD) WITH PROPOFOL;  Surgeon: [name], MD;  Location: [facility];  Service: Endoscopy;  Laterality: N/A;  . EXPLORATORY LAPAROTOMY  2003  . FOOT SURGERY Bilateral 2000   Bone spur removed  . INCISIONAL HERNIA REPAIR N/A 06/09/2014   Procedure: RECURRENT  INCISIONAL HERNIORRHAPHY WITH MESH;  Surgeon: [name], MD;  Location: [facility];  Service: General;  Laterality: N/A;  . INCISIONAL HERNIA REPAIR N/A 10/15/2014   Procedure: [place];  Surgeon: [name], MD;  Location: [place];  Service: General;  Laterality: N/A;  . INSERTION OF MESH N/A 06/09/2014   Procedure: INSERTION OF MESH;  Surgeon: [name], MD;  Location: [facility];  Service: General;  Laterality: N/A;  . INSERTION OF MESH N/A 10/15/2014   Procedure: INSERTION OF MESH;  Surgeon: [name], MD;  Location: [place];  Service: General;  Laterality: N/A;  . POLYPECTOMY  04/17/2015   Procedure: POLYPECTOMY;  Surgeon: [name], MD;  Location: [facility];  Service: Endoscopy;;  . RESECTION DISTAL CLAVICAL  05/07/2012   Procedure: RESECTION DISTAL CLAVICAL;  Surgeon: [name], MD;  Location: [facility];  Service: Orthopedics;  Laterality: Right;  . SHOULDER OPEN ROTATOR CUFF REPAIR  05/07/2012   Procedure: ROTATOR CUFF REPAIR SHOULDER OPEN;  Surgeon: [name], MD;  Location: [facility];  Service: Orthopedics;  Laterality: Right;  . THYROIDECTOMY  2009  . TONSILLECTOMY  1958  . UMBILICAL  HERNIA REPAIR  2010     No outpatient medications have been marked as taking for the 11/06/18 encounter (Appointment) with Satira Sark, MD.     Allergies:   Doxycycline; Adhesive [tape]; Biaxin [clarithromycin]; Percocet [oxycodone-acetaminophen]; Xarelto [rivaroxaban]; Clarithromycin; Clindamycin/lincomycin; Neosporin [neomycin-polymyxin b gu]; and Penicillins   Social History   Tobacco Use  . Smoking status: Never Smoker  . Smokeless tobacco: Never Used   Substance Use Topics  . Alcohol use: No    Alcohol/week: 0.0 standard drinks  . Drug use: No     Family Hx: The patient's family history includes Arthritis in her unknown relative; Asthma in her unknown relative; Cancer in her unknown relative; Diabetes in her unknown relative; Heart disease in her unknown relative; Kidney disease in her unknown relative.  ROS:   Please see the history of present illness.    *** All other systems reviewed and are negative.   Prior CV studies:   The following studies were reviewed today:  Cardiac catheterization 10/22/2010: HEMODYNAMIC DATA: 1. The central aortic pressure was 140/70, mean 90. 2. LV pressure 141/20. 3. There was no gradient or pullback across aortic valve.  ANGIOGRAPHIC DATA: 1. Ventriculography is done in the RAO projection. Overall systolic function is preserved. No segmental abnormalities contraction were identified. 2. There is minor calcification in the midportion of the left anterior descending artery. 3. The left main is short. It bifurcates into a large LAD and a dominant circumflex vessel. 4. The LAD is noted, has some very mild calcification in the midvessel. The LAD proximally is a large-caliber vessel without critical disease. There is minimal luminal irregularity in the midportion of the artery. No significant areas of focal obstruction were identified. The LAD has 1 major diagonal branch and several smaller diagonal branches in the apical vessel wraps the apical tip. 5. The circumflex is a fairly large-caliber vessel. It is a dominant vessel. It provides a large bifurcating then trifurcating marginal branch, and then courses posteriorly where provides a posterolateral system in a posterior descending branch. Focal obstruction is not noted. 6. The right coronary artery is a nondominant vessel. It is smooth. There is a tiny PDA branch which  probably courses parallel to the larger circumflex branch. There is a small to moderate acute marginal branch as well.  CONCLUSIONS: 1. Well preserved overall left ventricular systolic function. 2. No evidence of high-grade focal coronary obstruction. 3. Minimal calcification of the LAD.  Labs/Other Tests and Data Reviewed:    EKG:  An ECG dated 03/27/2018 was personally reviewed today and demonstrated:  Atrial fibrillation with low voltage and nonspecific T wave changes.  Recent Labs: 05/08/2018: BUN 8; Creatinine, Ser 0.82; Hemoglobin 9.4; Platelets 166; Potassium 4.0; Sodium 132   December 2019: Hemoglobin A1c 11.2%  Wt Readings from Last 3 Encounters:  05/26/18 (!) 305 lb (138.3 kg)  05/09/18 (!) 304 lb (137.9 kg)  03/27/18 (!) 304 lb (137.9 kg)     Objective:    Vital Signs:  There were no vitals taken for this visit.   {[facility] Virtual Exam (Optional):[phone]::"VITAL SIGNS:  reviewed"}  ASSESSMENT & PLAN:    1. ***  COVID-19 Education: The signs and symptoms of COVID-19 were discussed with the patient and how to seek care for testing (follow up with PCP or arrange E-visit).  ***The importance of social distancing was discussed today.  Time:   Today, I have spent *** minutes with the patient with telehealth technology discussing the above problems.  Medication Adjustments/Labs and Tests Ordered: Current medicines are reviewed at length with the patient today.  Concerns regarding medicines are outlined above.   Tests Ordered: No orders of the defined types were placed in this encounter.   Medication Changes: No orders of the defined types were placed in this encounter.   Disposition:  Follow up {follow up:15908}  Signed, Rozann Lesches, MD  11/05/2018 10:34 AM    [facility]

## 2018-11-06 ENCOUNTER — Emergency Department (HOSPITAL_COMMUNITY): Payer: PPO

## 2018-11-06 ENCOUNTER — Other Ambulatory Visit: Payer: Self-pay

## 2018-11-06 ENCOUNTER — Encounter: Payer: Self-pay | Admitting: Cardiology

## 2018-11-06 ENCOUNTER — Encounter (HOSPITAL_COMMUNITY): Payer: Self-pay

## 2018-11-06 ENCOUNTER — Inpatient Hospital Stay (HOSPITAL_COMMUNITY)
Admission: EM | Admit: 2018-11-06 | Discharge: 2018-11-11 | DRG: 872 | Disposition: A | Discharge status: Home / Self Care | Payer: PPO | Attending: Internal Medicine | Admitting: Internal Medicine

## 2018-11-06 ENCOUNTER — Ambulatory Visit: Payer: PPO | Admitting: Cardiology

## 2018-11-06 DIAGNOSIS — E890 Postprocedural hypothyroidism: Secondary | ICD-10-CM | POA: Diagnosis not present

## 2018-11-06 DIAGNOSIS — Z91048 Other nonmedicinal substance allergy status: Secondary | ICD-10-CM

## 2018-11-06 DIAGNOSIS — Z9049 Acquired absence of other specified parts of digestive tract: Secondary | ICD-10-CM

## 2018-11-06 DIAGNOSIS — D509 Iron deficiency anemia, unspecified: Secondary | ICD-10-CM | POA: Diagnosis not present

## 2018-11-06 DIAGNOSIS — F329 Major depressive disorder, single episode, unspecified: Secondary | ICD-10-CM | POA: Diagnosis present

## 2018-11-06 DIAGNOSIS — A419 Sepsis, unspecified organism: Secondary | ICD-10-CM | POA: Diagnosis not present

## 2018-11-06 DIAGNOSIS — I119 Hypertensive heart disease without heart failure: Secondary | ICD-10-CM | POA: Diagnosis not present

## 2018-11-06 DIAGNOSIS — E1165 Type 2 diabetes mellitus with hyperglycemia: Secondary | ICD-10-CM | POA: Diagnosis present

## 2018-11-06 DIAGNOSIS — Z885 Allergy status to narcotic agent status: Secondary | ICD-10-CM

## 2018-11-06 DIAGNOSIS — Z9089 Acquired absence of other organs: Secondary | ICD-10-CM | POA: Diagnosis not present

## 2018-11-06 DIAGNOSIS — G4730 Sleep apnea, unspecified: Secondary | ICD-10-CM | POA: Diagnosis present

## 2018-11-06 DIAGNOSIS — Z79899 Other long term (current) drug therapy: Secondary | ICD-10-CM

## 2018-11-06 DIAGNOSIS — Z20828 Contact with and (suspected) exposure to other viral communicable diseases: Secondary | ICD-10-CM | POA: Diagnosis not present

## 2018-11-06 DIAGNOSIS — E876 Hypokalemia: Secondary | ICD-10-CM | POA: Diagnosis present

## 2018-11-06 DIAGNOSIS — R6520 Severe sepsis without septic shock: Secondary | ICD-10-CM | POA: Diagnosis not present

## 2018-11-06 DIAGNOSIS — Z85850 Personal history of malignant neoplasm of thyroid: Secondary | ICD-10-CM | POA: Diagnosis not present

## 2018-11-06 DIAGNOSIS — J449 Chronic obstructive pulmonary disease, unspecified: Secondary | ICD-10-CM | POA: Diagnosis not present

## 2018-11-06 DIAGNOSIS — E785 Hyperlipidemia, unspecified: Secondary | ICD-10-CM | POA: Diagnosis not present

## 2018-11-06 DIAGNOSIS — K7460 Unspecified cirrhosis of liver: Secondary | ICD-10-CM | POA: Diagnosis not present

## 2018-11-06 DIAGNOSIS — Z881 Allergy status to other antibiotic agents status: Secondary | ICD-10-CM

## 2018-11-06 DIAGNOSIS — E039 Hypothyroidism, unspecified: Secondary | ICD-10-CM | POA: Diagnosis not present

## 2018-11-06 DIAGNOSIS — K219 Gastro-esophageal reflux disease without esophagitis: Secondary | ICD-10-CM | POA: Diagnosis present

## 2018-11-06 DIAGNOSIS — Z791 Long term (current) use of non-steroidal anti-inflammatories (NSAID): Secondary | ICD-10-CM

## 2018-11-06 DIAGNOSIS — Z6841 Body Mass Index (BMI) 40.0 and over, adult: Secondary | ICD-10-CM | POA: Diagnosis not present

## 2018-11-06 DIAGNOSIS — I4891 Unspecified atrial fibrillation: Secondary | ICD-10-CM | POA: Diagnosis present

## 2018-11-06 DIAGNOSIS — I517 Cardiomegaly: Secondary | ICD-10-CM | POA: Diagnosis not present

## 2018-11-06 DIAGNOSIS — L03116 Cellulitis of left lower limb: Secondary | ICD-10-CM | POA: Diagnosis not present

## 2018-11-06 DIAGNOSIS — I482 Chronic atrial fibrillation, unspecified: Secondary | ICD-10-CM | POA: Diagnosis not present

## 2018-11-06 DIAGNOSIS — R0989 Other specified symptoms and signs involving the circulatory and respiratory systems: Secondary | ICD-10-CM | POA: Diagnosis not present

## 2018-11-06 DIAGNOSIS — A408 Other streptococcal sepsis: Secondary | ICD-10-CM | POA: Diagnosis not present

## 2018-11-06 DIAGNOSIS — Z883 Allergy status to other anti-infective agents status: Secondary | ICD-10-CM

## 2018-11-06 DIAGNOSIS — Z880 Allergy status to penicillin: Secondary | ICD-10-CM

## 2018-11-06 DIAGNOSIS — Z79890 Hormone replacement therapy (postmenopausal): Secondary | ICD-10-CM

## 2018-11-06 DIAGNOSIS — I10 Essential (primary) hypertension: Secondary | ICD-10-CM | POA: Diagnosis not present

## 2018-11-06 DIAGNOSIS — I480 Paroxysmal atrial fibrillation: Secondary | ICD-10-CM | POA: Diagnosis present

## 2018-11-06 DIAGNOSIS — Z794 Long term (current) use of insulin: Secondary | ICD-10-CM | POA: Diagnosis not present

## 2018-11-06 DIAGNOSIS — Z90710 Acquired absence of both cervix and uterus: Secondary | ICD-10-CM | POA: Diagnosis not present

## 2018-11-06 DIAGNOSIS — L0390 Cellulitis, unspecified: Secondary | ICD-10-CM

## 2018-11-06 DIAGNOSIS — Z7984 Long term (current) use of oral hypoglycemic drugs: Secondary | ICD-10-CM

## 2018-11-06 DIAGNOSIS — R51 Headache: Secondary | ICD-10-CM | POA: Diagnosis not present

## 2018-11-06 DIAGNOSIS — E1159 Type 2 diabetes mellitus with other circulatory complications: Secondary | ICD-10-CM | POA: Diagnosis not present

## 2018-11-06 DIAGNOSIS — E119 Type 2 diabetes mellitus without complications: Secondary | ICD-10-CM

## 2018-11-06 DIAGNOSIS — R7881 Bacteremia: Secondary | ICD-10-CM | POA: Diagnosis not present

## 2018-11-06 DIAGNOSIS — R000 Tachycardia, unspecified: Secondary | ICD-10-CM | POA: Diagnosis not present

## 2018-11-06 DIAGNOSIS — Z888 Allergy status to other drugs, medicaments and biological substances status: Secondary | ICD-10-CM

## 2018-11-06 DIAGNOSIS — Z7901 Long term (current) use of anticoagulants: Secondary | ICD-10-CM

## 2018-11-06 DIAGNOSIS — Z79891 Long term (current) use of opiate analgesic: Secondary | ICD-10-CM

## 2018-11-06 LAB — COMPREHENSIVE METABOLIC PANEL
ALT: 16 U/L (ref 0–44)
AST: 23 U/L (ref 15–41)
Albumin: 3.2 g/dL — ABNORMAL LOW (ref 3.5–5.0)
Alkaline Phosphatase: 166 U/L — ABNORMAL HIGH (ref 38–126)
Anion gap: 11 (ref 5–15)
BUN: 14 mg/dL (ref 8–23)
CO2: 24 mmol/L (ref 22–32)
Calcium: 8.7 mg/dL — ABNORMAL LOW (ref 8.9–10.3)
Chloride: 95 mmol/L — ABNORMAL LOW (ref 98–111)
Creatinine, Ser: 0.87 mg/dL (ref 0.44–1.00)
GFR calc Af Amer: >60 mL/min (ref >60)
GFR calc non Af Amer: >60 mL/min (ref >60)
Glucose, Bld: 184 mg/dL — ABNORMAL HIGH (ref 70–99)
Potassium: 4.2 mmol/L (ref 3.5–5.1)
Sodium: 130 mmol/L — ABNORMAL LOW (ref 135–145)
Total Bilirubin: 0.8 mg/dL (ref 0.3–1.2)
Total Protein: 7.4 g/dL (ref 6.5–8.1)

## 2018-11-06 LAB — CBC WITH DIFFERENTIAL/PLATELET
Abs Immature Granulocytes: 0.56 10*3/uL — ABNORMAL HIGH (ref 0.00–0.07)
Basophils Absolute: 0.1 10*3/uL (ref 0.0–0.1)
Basophils Relative: 0 %
Eosinophils Absolute: 0 10*3/uL (ref 0.0–0.5)
Eosinophils Relative: 0 %
HCT: 29 % — ABNORMAL LOW (ref 36.0–46.0)
Hemoglobin: 8.5 g/dL — ABNORMAL LOW (ref 12.0–15.0)
Immature Granulocytes: 2 %
Lymphocytes Relative: 2 %
Lymphs Abs: 0.6 10*3/uL — ABNORMAL LOW (ref 0.7–4.0)
MCH: 22.1 pg — ABNORMAL LOW (ref 26.0–34.0)
MCHC: 29.3 g/dL — ABNORMAL LOW (ref 30.0–36.0)
MCV: 75.3 fL — ABNORMAL LOW (ref 80.0–100.0)
Monocytes Absolute: 1.3 10*3/uL — ABNORMAL HIGH (ref 0.1–1.0)
Monocytes Relative: 4 %
Neutro Abs: 28.6 10*3/uL — ABNORMAL HIGH (ref 1.7–7.7)
Neutrophils Relative %: 92 %
Platelets: 201 10*3/uL (ref 150–400)
RBC: 3.85 MIL/uL — ABNORMAL LOW (ref 3.87–5.11)
RDW: 16.4 % — ABNORMAL HIGH (ref 11.5–15.5)
WBC: 31.2 10*3/uL — ABNORMAL HIGH (ref 4.0–10.5)
nRBC: 0 % (ref 0.0–0.2)

## 2018-11-06 LAB — CBG MONITORING, ED: Glucose-Capillary: 158 mg/dL — ABNORMAL HIGH (ref 70–99)

## 2018-11-06 MED ORDER — SODIUM CHLORIDE 0.9 % IV BOLUS
1000.0000 mL | Freq: Once | INTRAVENOUS | Status: AC
  Administered 2018-11-06: 1000 mL via INTRAVENOUS

## 2018-11-06 NOTE — ED Provider Notes (Signed)
[facility] EMERGENCY DEPARTMENT Provider Note   CSN: [id number] Arrival date & time: 11/06/18  2109    History   Chief Complaint Chief Complaint  Patient presents with  . Hyperglycemia    HPI [name] [name] is a 63 y.o. female.     HPI   She presents for evaluation of elevated blood sugar and blood pressure.  She also complains of a headache.  Patient reports she is feeling sleepy.  She states the symptoms started today.  She reports she is taking her usual prescribed medications.  She states that she is thirsty and wants to drink a "soda pop."  She arrived to the ED, with a sixpack of diet Dr. [name].  She denies recent cough, shortness of breath, headache, fever or chills.  There are no other known modifying factors.  Past Medical History:  Diagnosis Date  . Anemia   . Anxiety   . Arthritis   . Asthma   . Atrial fibrillation ([place])   . Cirrhosis of liver ([place])   . COPD (chronic obstructive pulmonary disease) ([place])   . Depression   . Edema   . Essential hypertension, benign   . GERD (gastroesophageal reflux disease)   . Hypothyroidism   . Morbid obesity ([place])   . Sleep apnea    CPAP - not consistent  . Thyroid cancer ([place])   . Type 2 diabetes mellitus [facility])     Patient Active Problem List   Diagnosis Date Noted  . Hepatic cirrhosis ([place]) 09/01/2014  . Cirrhosis, nonalcoholic ([place]) 29/47/6546  . Incisional hernia 06/09/2014  . S/P complete repair of rotator cuff 07/24/2012  . Precordial pain 09/30/2010  . Essential hypertension, benign 09/30/2010  . Type 2 diabetes mellitus ([place]) 09/05/2008  . OBESITY-MORBID (>100') 09/05/2008  . ESOPHAGEAL REFLUX 09/05/2008  . EDEMA 09/05/2008    Past Surgical History:  Procedure Laterality Date  . "pump bumps"     bilateral heels--2000  . ABDOMINAL HYSTERECTOMY  1985  . CARDIAC CATHETERIZATION  2012   Dr. Lia Foyer told her nothing was wrong.  [name] [place]  . CHOLECYSTECTOMY  1996  . COLONOSCOPY WITH  PROPOFOL N/A 04/17/2015   Procedure: COLONOSCOPY WITH PROPOFOL  at cecum at 0810; withdrawal time =15 minutes;  Surgeon: [name], MD;  Location: [facility];  Service: Endoscopy;  Laterality: N/A;  . Debridement of abdominal wound  2011  . ESOPHAGEAL DILATION N/A 04/17/2015   Procedure: ESOPHAGEAL DILATION WITH 56FR MALONEY DILATOR;  Surgeon: [name], MD;  Location: [facility];  Service: Endoscopy;  Laterality: N/A;  . ESOPHAGOGASTRODUODENOSCOPY (EGD) WITH PROPOFOL N/A 04/17/2015   Procedure: ESOPHAGOGASTRODUODENOSCOPY (EGD) WITH PROPOFOL;  Surgeon: [name], MD;  Location: [facility];  Service: Endoscopy;  Laterality: N/A;  . EXPLORATORY LAPAROTOMY  2003  . FOOT SURGERY Bilateral 2000   Bone spur removed  . INCISIONAL HERNIA REPAIR N/A 06/09/2014   Procedure: RECURRENT  INCISIONAL HERNIORRHAPHY WITH MESH;  Surgeon: [name], MD;  Location: [facility];  Service: General;  Laterality: N/A;  . INCISIONAL HERNIA REPAIR N/A 10/15/2014   Procedure: [place];  Surgeon: [name], MD;  Location: [place];  Service: General;  Laterality: N/A;  . INSERTION OF MESH N/A 06/09/2014   Procedure: INSERTION OF MESH;  Surgeon: [name], MD;  Location: [facility];  Service: General;  Laterality: N/A;  . INSERTION OF MESH N/A 10/15/2014   Procedure: INSERTION OF MESH;  Surgeon: [name], MD;  Location: [place] [facility];  Service: General;  Laterality: N/A;  . POLYPECTOMY  04/17/2015   Procedure: POLYPECTOMY;  Surgeon: [name], MD;  Location: [facility];  Service: Endoscopy;;  . RESECTION DISTAL CLAVICAL  05/07/2012   Procedure: RESECTION DISTAL CLAVICAL;  Surgeon: [name], MD;  Location: [facility];  Service: Orthopedics;  Laterality: Right;  . SHOULDER OPEN ROTATOR CUFF REPAIR  05/07/2012   Procedure: ROTATOR CUFF REPAIR SHOULDER OPEN;  Surgeon: [name], MD;  Location: [facility];  Service: Orthopedics;  Laterality: Right;  . THYROIDECTOMY  2009  .  TONSILLECTOMY  1958  . UMBILICAL HERNIA REPAIR  2010     OB History   No obstetric history on file.      Home Medications    Prior to Admission medications   Medication Sig Start Date End Date Taking? Authorizing Provider  albuterol (PROVENTIL HFA) 108 (90 BASE) MCG/ACT inhaler Inhale 2 puffs into the lungs every 6 (six) hours as needed for wheezing or shortness of breath. Shortness of breath    [provider]  apixaban (ELIQUIS) 5 MG TABS tablet Take 1 tablet (5 mg total) by mouth 2 (two) times daily. 04/10/18   Satira Sark, MD  colesevelam [facility]) 625 MG tablet Take 1,875 mg by mouth 2 (two) times daily with a meal. 3 tabs am 3 tabs pm    [provider]  diltiazem (CARDIZEM CD) 360 MG 24 hr capsule Take 1 capsule (360 mg total) by mouth daily. 03/27/18   Satira Sark, MD  DULoxetine (CYMBALTA) 30 MG capsule Take 30 mg by mouth daily.  01/18/17   [provider]  DULoxetine (CYMBALTA) 60 MG capsule Take 60 mg by mouth daily.  03/20/15   [provider]  econazole nitrate 1 % cream Apply topically daily. Patient taking differently: Apply 1 application topically daily as needed (for rash irritation).  12/09/15   Tuchman, Richard C, DPM  furosemide (LASIX) 20 MG tablet Take 20 mg by mouth daily as needed for fluid.     [provider]  HYDROcodone-acetaminophen (NORCO) 10-325 MG tablet Take 1 tablet by mouth every hour as needed.    [provider]  HYDROmorphone (DILAUDID) 4 MG tablet Take 4 mg by mouth every 6 (six) hours as needed for severe pain.  08/21/14   [provider]  levothyroxine (SYNTHROID, LEVOTHROID) 50 MCG tablet Take 50 mcg by mouth daily before breakfast. Add 50 mcg for total of 250 mcg per day    [provider]  lisinopril (PRINIVIL,ZESTRIL) 20 MG tablet Take 20 mg by mouth daily.  01/18/17   [provider]  losartan (COZAAR) 100 MG tablet Take 100 mg by mouth daily.  04/27/18    [provider]  meloxicam (MOBIC) 15 MG tablet Take 15 mg by mouth daily.  10/22/15   [provider]  metFORMIN (GLUCOPHAGE) 1000 MG tablet Take 1,000 mg by mouth 2 (two) times daily with a meal.    [provider]  nystatin (NYSTOP) 100000 UNIT/GM POWD Apply 100,000 g topically daily as needed (for rash-irritation). Rash 08/18/10   [provider]  nystatin cream (MYCOSTATIN) Apply 1 application topically 3 (three) times daily.  03/23/17   [provider]  oxybutynin (DITROPAN) 5 MG tablet Take 1 tablet by mouth daily.    [provider]  pantoprazole (PROTONIX) 40 MG tablet Take 1 tablet (40 mg total) by mouth daily before breakfast. 03/28/17   Rehman, Mechele Dawley,  MD  potassium chloride SA (K-DUR,KLOR-CON) 20 MEQ tablet Take 20 mEq by mouth 2 (two) times daily.  04/27/18   [provider]  SYNTHROID 200 MCG tablet Take 1 tablet by mouth daily. 11/16/17   [provider]    Family History Family History  Problem Relation Age of Onset  . Heart disease Other   . Arthritis Other   . Cancer Other   . Asthma Other   . Diabetes Other   . Kidney disease Other     Social History Social History   Tobacco Use  . Smoking status: Never Smoker  . Smokeless tobacco: Never Used  Substance Use Topics  . Alcohol use: No    Alcohol/week: 0.0 standard drinks  . Drug use: No     Allergies   Doxycycline; Adhesive [tape]; Biaxin [clarithromycin]; Percocet [oxycodone-acetaminophen]; Xarelto [rivaroxaban]; Clarithromycin; Clindamycin/lincomycin; Neosporin [neomycin-polymyxin b gu]; and Penicillins   Review of Systems Review of Systems  All other systems reviewed and are negative.    Physical Exam Updated Vital Signs BP (!) 165/57 (BP Location: Left Arm)   Pulse (!) 102   Temp 99.8 F (37.7 C) (Oral)   Resp 20   Ht 5' 4"  (1.626 m)   Wt 112.9 kg   SpO2 95%   BMI 42.74 kg/m   Physical Exam Vitals signs and nursing note  reviewed.  Constitutional:      General: She is not in acute distress.    Appearance: She is well-developed. She is obese. She is not ill-appearing, toxic-appearing or diaphoretic.  HENT:     Head: Normocephalic and atraumatic.     Right Ear: External ear normal.     Left Ear: External ear normal.     Mouth/Throat:     Mouth: Mucous membranes are moist.     Pharynx: No oropharyngeal exudate or posterior oropharyngeal erythema.  Eyes:     Conjunctiva/sclera: Conjunctivae normal.     Pupils: Pupils are equal, round, and reactive to light.  Neck:     Musculoskeletal: Normal range of motion and neck supple.     Trachea: Phonation normal.  Cardiovascular:     Rate and Rhythm: Normal rate and regular rhythm.     Heart sounds: Normal heart sounds.  Pulmonary:     Effort: Pulmonary effort is normal. No respiratory distress.     Breath sounds: Normal breath sounds. No stridor. No rhonchi.  Abdominal:     General: There is no distension.     Palpations: Abdomen is soft.     Tenderness: There is no abdominal tenderness.     Hernia: No hernia is present.  Musculoskeletal: Normal range of motion.        General: No swelling or tenderness.  Skin:    General: Skin is warm and dry.  Neurological:     Mental Status: She is alert and oriented to person, place, and time.     Cranial Nerves: No cranial nerve deficit.     Sensory: No sensory deficit.     Motor: No abnormal muscle tone.     Coordination: Coordination normal.  Psychiatric:        Attention and Perception: She is inattentive.        Mood and Affect: Mood normal.        Speech: She is communicative. Speech is not rapid and pressured.        Behavior: Behavior normal. Behavior is not agitated, slowed or withdrawn.  Thought Content: Thought content is not delusional. Thought content does not include suicidal ideation.        Judgment: Judgment is impulsive.      ED Treatments / Results  Labs (all labs ordered are  listed, but only abnormal results are displayed) Labs Reviewed  CBG MONITORING, ED - Abnormal; Notable for the following components:      Result Value   Glucose-Capillary 158 (*)    All other components within normal limits    EKG None  Radiology No results found.  Procedures Procedures (including critical care time)  Medications Ordered in ED Medications - No data to display   Initial Impression / Assessment and Plan / ED Course  I have reviewed the triage vital signs and the nursing notes.  Pertinent labs & imaging results that were available during my care of the patient were reviewed by me and considered in my medical decision making (see chart for details).         Patient Vitals for the past 24 hrs:  BP Temp Temp src Pulse Resp SpO2 Height Weight  11/06/18 2214 - - - - - - 5' 4"  (1.626 m) 112.9 kg  11/06/18 2211 (!) 165/57 99.8 F (37.7 C) Oral (!) 102 20 95 % - -    11:36 PM Reevaluation with update and discussion. After initial assessment and treatment, an updated evaluation reveals labs still pending, care to oncoming provider to evaluate after return.Daleen Bo   Medical Decision Making: Nonspecific hyperglycemia.  Patient with vague symptoms.  Screening evaluation ordered for help with disposition.  CRITICAL CARE-no Performed by: Daleen Bo  Nursing Notes Reviewed/ Care Coordinated Applicable Imaging Reviewed Interpretation of Laboratory Data incorporated into ED treatment  Disposition per Dr. Roderic Palau  Final Clinical Impressions(s) / ED Diagnoses   Final diagnoses:  Severe sepsis [facility])    ED Discharge Orders    None       Daleen Bo, MD 11/07/18 1149

## 2018-11-06 NOTE — ED Triage Notes (Addendum)
Pt reports high blood sugar, headache, and high blood pressure that started today. Pt blood pressure in triage is 165/57 and CBG is 158. Pt says she is sleepy and has headache, does not go into detail about anything else. Pt answering some questions appropriately and some questions inappropriately. Pt seems confused at times. Dr Eulis Foster made aware of this when pt was taken to room 11.

## 2018-11-07 ENCOUNTER — Encounter (HOSPITAL_COMMUNITY): Payer: Self-pay | Admitting: Internal Medicine

## 2018-11-07 DIAGNOSIS — A408 Other streptococcal sepsis: Secondary | ICD-10-CM | POA: Diagnosis present

## 2018-11-07 DIAGNOSIS — E039 Hypothyroidism, unspecified: Secondary | ICD-10-CM | POA: Diagnosis present

## 2018-11-07 DIAGNOSIS — R51 Headache: Secondary | ICD-10-CM | POA: Diagnosis present

## 2018-11-07 DIAGNOSIS — E1159 Type 2 diabetes mellitus with other circulatory complications: Secondary | ICD-10-CM

## 2018-11-07 DIAGNOSIS — R7881 Bacteremia: Secondary | ICD-10-CM | POA: Diagnosis not present

## 2018-11-07 DIAGNOSIS — Z9049 Acquired absence of other specified parts of digestive tract: Secondary | ICD-10-CM | POA: Diagnosis not present

## 2018-11-07 DIAGNOSIS — E876 Hypokalemia: Secondary | ICD-10-CM | POA: Diagnosis present

## 2018-11-07 DIAGNOSIS — K219 Gastro-esophageal reflux disease without esophagitis: Secondary | ICD-10-CM

## 2018-11-07 DIAGNOSIS — E890 Postprocedural hypothyroidism: Secondary | ICD-10-CM | POA: Diagnosis present

## 2018-11-07 DIAGNOSIS — I119 Hypertensive heart disease without heart failure: Secondary | ICD-10-CM | POA: Diagnosis present

## 2018-11-07 DIAGNOSIS — Z79890 Hormone replacement therapy (postmenopausal): Secondary | ICD-10-CM | POA: Diagnosis not present

## 2018-11-07 DIAGNOSIS — G4730 Sleep apnea, unspecified: Secondary | ICD-10-CM | POA: Diagnosis present

## 2018-11-07 DIAGNOSIS — Z9089 Acquired absence of other organs: Secondary | ICD-10-CM | POA: Diagnosis not present

## 2018-11-07 DIAGNOSIS — Z794 Long term (current) use of insulin: Secondary | ICD-10-CM

## 2018-11-07 DIAGNOSIS — J449 Chronic obstructive pulmonary disease, unspecified: Secondary | ICD-10-CM | POA: Diagnosis present

## 2018-11-07 DIAGNOSIS — A419 Sepsis, unspecified organism: Secondary | ICD-10-CM

## 2018-11-07 DIAGNOSIS — Z85850 Personal history of malignant neoplasm of thyroid: Secondary | ICD-10-CM | POA: Diagnosis not present

## 2018-11-07 DIAGNOSIS — I482 Chronic atrial fibrillation, unspecified: Secondary | ICD-10-CM | POA: Diagnosis present

## 2018-11-07 DIAGNOSIS — I10 Essential (primary) hypertension: Secondary | ICD-10-CM

## 2018-11-07 DIAGNOSIS — Z20828 Contact with and (suspected) exposure to other viral communicable diseases: Secondary | ICD-10-CM | POA: Diagnosis present

## 2018-11-07 DIAGNOSIS — E785 Hyperlipidemia, unspecified: Secondary | ICD-10-CM | POA: Diagnosis present

## 2018-11-07 DIAGNOSIS — Z7901 Long term (current) use of anticoagulants: Secondary | ICD-10-CM | POA: Diagnosis not present

## 2018-11-07 DIAGNOSIS — I4891 Unspecified atrial fibrillation: Secondary | ICD-10-CM | POA: Diagnosis present

## 2018-11-07 DIAGNOSIS — K7460 Unspecified cirrhosis of liver: Secondary | ICD-10-CM

## 2018-11-07 DIAGNOSIS — I480 Paroxysmal atrial fibrillation: Secondary | ICD-10-CM

## 2018-11-07 DIAGNOSIS — F329 Major depressive disorder, single episode, unspecified: Secondary | ICD-10-CM | POA: Diagnosis present

## 2018-11-07 DIAGNOSIS — L03116 Cellulitis of left lower limb: Secondary | ICD-10-CM | POA: Diagnosis present

## 2018-11-07 DIAGNOSIS — Z90710 Acquired absence of both cervix and uterus: Secondary | ICD-10-CM | POA: Diagnosis not present

## 2018-11-07 DIAGNOSIS — D509 Iron deficiency anemia, unspecified: Secondary | ICD-10-CM | POA: Diagnosis present

## 2018-11-07 DIAGNOSIS — E1165 Type 2 diabetes mellitus with hyperglycemia: Secondary | ICD-10-CM | POA: Diagnosis present

## 2018-11-07 DIAGNOSIS — Z6841 Body Mass Index (BMI) 40.0 and over, adult: Secondary | ICD-10-CM | POA: Diagnosis not present

## 2018-11-07 LAB — COMPREHENSIVE METABOLIC PANEL
ALT: 16 U/L (ref 0–44)
AST: 23 U/L (ref 15–41)
Albumin: 2.9 g/dL — ABNORMAL LOW (ref 3.5–5.0)
Alkaline Phosphatase: 152 U/L — ABNORMAL HIGH (ref 38–126)
Anion gap: 12 (ref 5–15)
BUN: 13 mg/dL (ref 8–23)
CO2: 21 mmol/L — ABNORMAL LOW (ref 22–32)
Calcium: 8.1 mg/dL — ABNORMAL LOW (ref 8.9–10.3)
Chloride: 95 mmol/L — ABNORMAL LOW (ref 98–111)
Creatinine, Ser: 0.77 mg/dL (ref 0.44–1.00)
GFR calc Af Amer: >60 mL/min (ref >60)
GFR calc non Af Amer: >60 mL/min (ref >60)
Glucose, Bld: 166 mg/dL — ABNORMAL HIGH (ref 70–99)
Potassium: 3.8 mmol/L (ref 3.5–5.1)
Sodium: 128 mmol/L — ABNORMAL LOW (ref 135–145)
Total Bilirubin: 0.8 mg/dL (ref 0.3–1.2)
Total Protein: 6.9 g/dL (ref 6.5–8.1)

## 2018-11-07 LAB — BLOOD CULTURE ID PANEL (REFLEXED)

## 2018-11-07 LAB — BASIC METABOLIC PANEL
Anion gap: 9 (ref 5–15)
BUN: 11 mg/dL (ref 8–23)
CO2: 22 mmol/L (ref 22–32)
Calcium: 7.7 mg/dL — ABNORMAL LOW (ref 8.9–10.3)
Chloride: 99 mmol/L (ref 98–111)
Creatinine, Ser: 0.76 mg/dL (ref 0.44–1.00)
GFR calc Af Amer: >60 mL/min (ref >60)
GFR calc non Af Amer: >60 mL/min (ref >60)
Glucose, Bld: 160 mg/dL — ABNORMAL HIGH (ref 70–99)
Potassium: 3.3 mmol/L — ABNORMAL LOW (ref 3.5–5.1)
Sodium: 130 mmol/L — ABNORMAL LOW (ref 135–145)

## 2018-11-07 LAB — URINALYSIS, ROUTINE W REFLEX MICROSCOPIC
Bilirubin Urine: NEGATIVE
Glucose, UA: NEGATIVE mg/dL
Hgb urine dipstick: NEGATIVE
Ketones, ur: NEGATIVE mg/dL
Leukocytes,Ua: NEGATIVE
Nitrite: NEGATIVE
Protein, ur: NEGATIVE mg/dL
Specific Gravity, Urine: 1.013 (ref 1.005–1.030)
pH: 5 (ref 5.0–8.0)

## 2018-11-07 LAB — CBC WITH DIFFERENTIAL/PLATELET
Abs Immature Granulocytes: 0.21 10*3/uL — ABNORMAL HIGH (ref 0.00–0.07)
Basophils Absolute: 0 10*3/uL (ref 0.0–0.1)
Basophils Relative: 0 %
Eosinophils Absolute: 0.1 10*3/uL (ref 0.0–0.5)
Eosinophils Relative: 0 %
HCT: 28.6 % — ABNORMAL LOW (ref 36.0–46.0)
Hemoglobin: 8.3 g/dL — ABNORMAL LOW (ref 12.0–15.0)
Immature Granulocytes: 1 %
Lymphocytes Relative: 3 %
Lymphs Abs: 0.5 10*3/uL — ABNORMAL LOW (ref 0.7–4.0)
MCH: 21.7 pg — ABNORMAL LOW (ref 26.0–34.0)
MCHC: 29 g/dL — ABNORMAL LOW (ref 30.0–36.0)
MCV: 74.7 fL — ABNORMAL LOW (ref 80.0–100.0)
Monocytes Absolute: 0.7 10*3/uL (ref 0.1–1.0)
Monocytes Relative: 3 %
Neutro Abs: 18.9 10*3/uL — ABNORMAL HIGH (ref 1.7–7.7)
Neutrophils Relative %: 93 %
Platelets: 146 10*3/uL — ABNORMAL LOW (ref 150–400)
RBC: 3.83 MIL/uL — ABNORMAL LOW (ref 3.87–5.11)
RDW: 16.5 % — ABNORMAL HIGH (ref 11.5–15.5)
WBC: 20.4 10*3/uL — ABNORMAL HIGH (ref 4.0–10.5)
nRBC: 0 % (ref 0.0–0.2)

## 2018-11-07 LAB — LACTIC ACID, PLASMA
Lactic Acid, Venous: 1.9 mmol/L (ref 0.5–1.9)
Lactic Acid, Venous: 2.5 mmol/L (ref 0.5–1.9)
Lactic Acid, Venous: 1.2 mmol/L (ref 0.5–1.9)
Lactic Acid, Venous: 1.6 mmol/L (ref 0.5–1.9)

## 2018-11-07 LAB — SARS CORONAVIRUS 2 BY RT PCR (HOSPITAL ORDER, PERFORMED IN ~~LOC~~ HOSPITAL LAB): SARS Coronavirus 2: NEGATIVE

## 2018-11-07 LAB — BRAIN NATRIURETIC PEPTIDE: B Natriuretic Peptide: 106 pg/mL — ABNORMAL HIGH (ref 0.0–100.0)

## 2018-11-07 LAB — GLUCOSE, CAPILLARY
Glucose-Capillary: 165 mg/dL — ABNORMAL HIGH (ref 70–99)
Glucose-Capillary: 167 mg/dL — ABNORMAL HIGH (ref 70–99)
Glucose-Capillary: 129 mg/dL — ABNORMAL HIGH (ref 70–99)
Glucose-Capillary: 109 mg/dL — ABNORMAL HIGH (ref 70–99)

## 2018-11-07 LAB — TSH: TSH: 1.393 u[IU]/mL (ref 0.350–4.500)

## 2018-11-07 MED ORDER — POTASSIUM CHLORIDE IN NACL 20-0.9 MEQ/L-% IV SOLN
INTRAVENOUS | Status: DC
  Administered 2018-11-07 (×2): via INTRAVENOUS

## 2018-11-07 MED ORDER — LEVOTHYROXINE SODIUM 75 MCG PO TABS: 75.0000 ug | ORAL_TABLET | Freq: Every day | ORAL | Status: DC

## 2018-11-07 MED ORDER — NYSTATIN 100000 UNIT/GM EX POWD
Freq: Two times a day (BID) | CUTANEOUS | Status: DC
  Administered 2018-11-07 – 2018-11-11 (×8): via TOPICAL
  Filled 2018-11-07: qty 15

## 2018-11-07 MED ORDER — SODIUM CHLORIDE 0.9 % IV SOLN
2.0000 g | Freq: Once | INTRAVENOUS | Status: AC
  Administered 2018-11-07: 2 g via INTRAVENOUS
  Filled 2018-11-07: qty 2

## 2018-11-07 MED ORDER — HYDROXYZINE HCL 25 MG PO TABS: 25.0000 mg | ORAL_TABLET | Freq: Four times a day (QID) | ORAL | Status: DC | PRN

## 2018-11-07 MED ORDER — PROCHLORPERAZINE EDISYLATE 10 MG/2ML IJ SOLN
10.0000 mg | Freq: Four times a day (QID) | INTRAMUSCULAR | Status: DC | PRN
  Administered 2018-11-07: 10 mg via INTRAVENOUS
  Filled 2018-11-07: qty 2

## 2018-11-07 MED ORDER — ACETAMINOPHEN 325 MG PO TABS
650.0000 mg | ORAL_TABLET | Freq: Four times a day (QID) | ORAL | Status: DC | PRN
  Administered 2018-11-07: 650 mg via ORAL
  Filled 2018-11-07 (×2): qty 2

## 2018-11-07 MED ORDER — INSULIN ASPART 100 UNIT/ML ~~LOC~~ SOLN
0.0000 [IU] | Freq: Three times a day (TID) | SUBCUTANEOUS | Status: DC
  Administered 2018-11-07: 3 [IU] via SUBCUTANEOUS
  Administered 2018-11-07 (×2): 4 [IU] via SUBCUTANEOUS
  Administered 2018-11-08: 3 [IU] via SUBCUTANEOUS
  Administered 2018-11-08: 4 [IU] via SUBCUTANEOUS
  Administered 2018-11-08 – 2018-11-09 (×3): 3 [IU] via SUBCUTANEOUS
  Administered 2018-11-09: 4 [IU] via SUBCUTANEOUS
  Administered 2018-11-10: 3 [IU] via SUBCUTANEOUS
  Administered 2018-11-10 – 2018-11-11 (×3): 4 [IU] via SUBCUTANEOUS

## 2018-11-07 MED ORDER — LEVOTHYROXINE SODIUM 75 MCG PO TABS
275.0000 ug | ORAL_TABLET | Freq: Every day | ORAL | Status: DC
  Administered 2018-11-08 – 2018-11-11 (×4): 275 ug via ORAL
  Filled 2018-11-07 (×4): qty 2

## 2018-11-07 MED ORDER — ENOXAPARIN SODIUM 60 MG/0.6ML ~~LOC~~ SOLN
0.5000 mg/kg | SUBCUTANEOUS | Status: DC
  Administered 2018-11-07: 55 mg via SUBCUTANEOUS
  Filled 2018-11-07: qty 0.6

## 2018-11-07 MED ORDER — FESOTERODINE FUMARATE ER 4 MG PO TB24
4.0000 mg | ORAL_TABLET | Freq: Every day | ORAL | Status: DC
  Administered 2018-11-07 – 2018-11-11 (×5): 4 mg via ORAL
  Filled 2018-11-07 (×5): qty 1

## 2018-11-07 MED ORDER — METRONIDAZOLE IN NACL 5-0.79 MG/ML-% IV SOLN
500.0000 mg | Freq: Three times a day (TID) | INTRAVENOUS | Status: DC
  Administered 2018-11-07 (×2): 500 mg via INTRAVENOUS
  Filled 2018-11-07 (×2): qty 100

## 2018-11-07 MED ORDER — PRAVASTATIN SODIUM 10 MG PO TABS
20.0000 mg | ORAL_TABLET | Freq: Every day | ORAL | Status: DC
  Administered 2018-11-07 – 2018-11-11 (×5): 20 mg via ORAL
  Filled 2018-11-07 (×6): qty 2

## 2018-11-07 MED ORDER — PANTOPRAZOLE SODIUM 40 MG PO TBEC
40.0000 mg | DELAYED_RELEASE_TABLET | Freq: Every day | ORAL | Status: DC
  Administered 2018-11-08 – 2018-11-11 (×4): 40 mg via ORAL
  Filled 2018-11-07 (×4): qty 1

## 2018-11-07 MED ORDER — DULOXETINE HCL 60 MG PO CPEP
60.0000 mg | ORAL_CAPSULE | Freq: Every day | ORAL | Status: DC
  Administered 2018-11-07 – 2018-11-11 (×5): 60 mg via ORAL
  Filled 2018-11-07 (×5): qty 1

## 2018-11-07 MED ORDER — SODIUM CHLORIDE 0.9 % IV BOLUS
1000.0000 mL | Freq: Once | INTRAVENOUS | Status: AC
  Administered 2018-11-07: 1000 mL via INTRAVENOUS

## 2018-11-07 MED ORDER — APIXABAN 5 MG PO TABS
5.0000 mg | ORAL_TABLET | Freq: Two times a day (BID) | ORAL | Status: DC
  Administered 2018-11-07 – 2018-11-11 (×8): 5 mg via ORAL
  Filled 2018-11-07 (×8): qty 1

## 2018-11-07 MED ORDER — VANCOMYCIN HCL IN DEXTROSE 1-5 GM/200ML-% IV SOLN: 1000.0000 mg | Freq: Two times a day (BID) | INTRAVENOUS | Status: DC

## 2018-11-07 MED ORDER — DULOXETINE HCL 30 MG PO CPEP: 30.0000 mg | ORAL_CAPSULE | Freq: Every day | ORAL | Status: DC

## 2018-11-07 MED ORDER — HYDROCODONE-ACETAMINOPHEN 10-325 MG PO TABS: 1.0000 | ORAL_TABLET | ORAL | Status: DC | PRN

## 2018-11-07 MED ORDER — TRAZODONE HCL 50 MG PO TABS
100.0000 mg | ORAL_TABLET | Freq: Every day | ORAL | Status: DC
  Administered 2018-11-07 – 2018-11-10 (×4): 200 mg via ORAL
  Filled 2018-11-07 (×4): qty 4

## 2018-11-07 MED ORDER — KETOROLAC TROMETHAMINE 30 MG/ML IJ SOLN
30.0000 mg | Freq: Once | INTRAMUSCULAR | Status: AC
  Administered 2018-11-07: 30 mg via INTRAVENOUS
  Filled 2018-11-07: qty 1

## 2018-11-07 MED ORDER — ALBUTEROL SULFATE (2.5 MG/3ML) 0.083% IN NEBU: 3.0000 mL | INHALATION_SOLUTION | Freq: Four times a day (QID) | RESPIRATORY_TRACT | Status: DC | PRN

## 2018-11-07 MED ORDER — VANCOMYCIN HCL IN DEXTROSE 1-5 GM/200ML-% IV SOLN
1000.0000 mg | Freq: Once | INTRAVENOUS | Status: AC
  Administered 2018-11-07: 1000 mg via INTRAVENOUS
  Filled 2018-11-07: qty 200

## 2018-11-07 MED ORDER — SODIUM CHLORIDE 0.9 % IV BOLUS
500.0000 mL | Freq: Once | INTRAVENOUS | Status: AC
  Administered 2018-11-07: 500 mL via INTRAVENOUS

## 2018-11-07 MED ORDER — DILTIAZEM HCL ER COATED BEADS 180 MG PO CP24
360.0000 mg | ORAL_CAPSULE | Freq: Every day | ORAL | Status: DC
  Administered 2018-11-07 – 2018-11-11 (×5): 360 mg via ORAL
  Filled 2018-11-07 (×6): qty 2

## 2018-11-07 MED ORDER — ACETAMINOPHEN 650 MG RE SUPP: 650.0000 mg | Freq: Four times a day (QID) | RECTAL | Status: DC | PRN

## 2018-11-07 MED ORDER — GABAPENTIN 300 MG PO CAPS
300.0000 mg | ORAL_CAPSULE | Freq: Every day | ORAL | Status: DC
  Administered 2018-11-07 – 2018-11-11 (×5): 300 mg via ORAL
  Filled 2018-11-07 (×5): qty 1

## 2018-11-07 MED ORDER — SODIUM CHLORIDE 0.9 % IV SOLN
2.0000 g | Freq: Three times a day (TID) | INTRAVENOUS | Status: DC
  Administered 2018-11-07 – 2018-11-08 (×3): 2 g via INTRAVENOUS
  Filled 2018-11-07 (×3): qty 2

## 2018-11-07 MED ORDER — CIPROFLOXACIN IN D5W 400 MG/200ML IV SOLN
400.0000 mg | Freq: Once | INTRAVENOUS | Status: AC
  Administered 2018-11-07: 400 mg via INTRAVENOUS
  Filled 2018-11-07: qty 200

## 2018-11-07 NOTE — Progress Notes (Signed)
Per lab: Blood culture 1 of 3 Anaerobic, gram stain positive cocci. ID Streptococcus species. Dr. Cathlean Sauer notified.

## 2018-11-07 NOTE — Progress Notes (Signed)
Positive Blood Culture: anaerobic bottle-gram positive cocci. Messaged to Dr. Cathlean Sauer.

## 2018-11-07 NOTE — ED Notes (Signed)
CRITICAL VALUE ALERT  Critical Value:  Lactic acid 2.5  Date & Time Notied:  11/07/18 0146   Provider Notified: [email or web address]  Orders Received/Actions taken: md notified

## 2018-11-07 NOTE — Progress Notes (Signed)
PROGRESS NOTE    [name] [name]  GNF:[id number] DOB: [birth year] DOA: 11/06/2018 PCP: Redmond School, MD    Brief Narrative:  63 year old female who presented with elevated glucose and headache.  She has a significant past medical history for anemia, osteoarthritis, asthma, chronic atrial fibrillation, nonalcoholic cirrhosis of the liver, COPD, hypertension, morbid obesity, type 2 diabetes mellitus and history of thyroid cancer.  She reported sudden onset of generalized headache, predominantly in the frontal area, associated with nausea and decreased appetite.  On her initial physical examination blood pressure was 107/40, heart rate 114, respiratory rate 28, oxygen saturation 97%, she was ill looking appearing, dry mucous membranes, decreased breath sounds at bases bilaterally, heart S1-2 present, tachycardic, abdomen soft nontender, multiple healing excoriation in the abdomen and thighs.  Mild erythema and foul-smelling disorder on lower abdomen and inguinal folds.  Sodium 130, potassium 4.2, chloride 95, bicarb 24, glucose 194, BUN 14, creatinine 0.87, bicarb 31.2, hemoglobin 8.5, hematocrit 29.0, platelets 201, urinalysis negative for infection.  Head CT negative for acute changes.  Chest x-ray with cardiomegaly increased vascular congestion.  EKG with 101 bpm, normal axis, sinus with normal intervals, no ST segment or T wave changes.  Patient was admitted to the hospital with the working diagnosis of sepsis due to lower extremity cellulitis.  Assessment & Plan:   Principal Problem:   Sepsis due to undetermined organism [facility]) Active Problems:   Type 2 diabetes mellitus (HCC)   Essential hypertension, benign   Cirrhosis, nonalcoholic (HCC)   Atrial fibrillation (H[facility])   COPD (chronic obstructive pulmonary disease) (HCC)   GERD (gastroesophageal reflux disease)   Hypothyroidism   Sleep apnea   1. Sepsis due to cellulitis/ right lower extremity. Persistent elevation of wbc up to 20, she  has been afebrile, blood culture positive gram stain for gram positive cocci. Will continue antibiotic therapy with cefepime. (dc vancomycin for now). Continue to follow up on cell count, temperature curve and final cultures. Suspected source is right lower extremity. Will decrease rate of IV fluids to 50 ml per H.  2. HTN. Will continue to hold on antihypertensive agents, avoid hypotension. Hold lisinopril and furosemide for now.   3. T2Dm. Continue glucose cover and monitoring with insulin sliding scale.   4. Paroxysmal atrial fibrillation. Rate control with diltiazem, will resume apixaban for anticoagulation.   5. COPD. Non signs of acute exacerbation.   6. Hypothyroid. Continue levothyroxine.   7. Morbid obesity. BMI is 42,7.   DVT prophylaxis: enoxaparin   Code Status:  full Family Communication: no family at the bedside  Disposition Plan/ discharge barriers: pending clinical improvement.   Body mass index is 42.74 kg/m. Malnutrition Type:      Malnutrition Characteristics:      Nutrition Interventions:     RN Pressure Injury Documentation:     Consultants:     Procedures:     Antimicrobials:   IV cefepime.     Subjective: Improved headache, but not yet back to baseline, no nausea or vomiting, positive somnolence. Scratching marks on her abdomen, legs and back.   Objective: Vitals:   11/07/18 0215 11/07/18 0230 11/07/18 0342 11/07/18 0553  BP:  (!) 107/40 111/68 (!) 139/52  Pulse: (!) 107 (!) 114 93 88  Resp: (!) 28 (!) 26 20 18   Temp:   99.3 F (37.4 C) 98.9 F (37.2 C)  TempSrc:   Oral Oral  SpO2: 97% 96% 100% 100%  Weight:      Height:  Intake/Output Summary (Last 24 hours) at 11/07/2018 1119 Last data filed at 11/07/2018 0900 Gross per 24 hour  Intake 1360 ml  Output -  Net 1360 ml   Filed Weights   11/06/18 2214  Weight: 112.9 kg    Examination:   General: ill looking appearing  Neurology: Awake and alert, non focal   E ENT: mild pallor, no icterus, oral mucosa moist Cardiovascular: No JVD. S1-S2 present, rhythmic, no gallops, rubs, or murmurs. No lower extremity edema. Pulmonary: positive breath sounds bilaterally, adequate air movement, no wheezing, rhonchi or rales. Gastrointestinal. Abdomen with no organomegaly, non tender, no rebound or guarding Skin. Right leg with mild erythema and increased local temperature.  Musculoskeletal: no joint deformities     Data Reviewed: I have personally reviewed following labs and imaging studies  CBC: Recent Labs  Lab 11/06/18 2257 11/07/18 0845  WBC 31.2* 20.4*  NEUTROABS 28.6* 18.9*  HGB 8.5* 8.3*  HCT 29.0* 28.6*  MCV 75.3* 74.7*  PLT 201 993*   Basic Metabolic Panel: Recent Labs  Lab 11/06/18 2257 11/07/18 0112 11/07/18 0845  NA 130* 128* 130*  K 4.2 3.8 3.3*  CL 95* 95* 99  CO2 24 21* 22  GLUCOSE 184* 166* 160*  BUN 14 13 11   CREATININE 0.87 0.77 0.76  CALCIUM 8.7* 8.1* 7.7*   GFR: Estimated Creatinine Clearance: 87.5 mL/min (by C-G formula based on SCr of 0.76 mg/dL). Liver Function Tests: Recent Labs  Lab 11/06/18 2257 11/07/18 0112  AST 23 23  ALT 16 16  ALKPHOS 166* 152*  BILITOT 0.8 0.8  PROT 7.4 6.9  ALBUMIN 3.2* 2.9*   No results for input(s): LIPASE, AMYLASE in the last 168 hours. No results for input(s): AMMONIA in the last 168 hours. Coagulation Profile: No results for input(s): INR, PROTIME in the last 168 hours. Cardiac Enzymes: No results for input(s): CKTOTAL, CKMB, CKMBINDEX, TROPONINI in the last 168 hours. BNP (last 3 results) No results for input(s): PROBNP in the last 8760 hours. HbA1C: No results for input(s): HGBA1C in the last 72 hours. CBG: Recent Labs  Lab 11/06/18 2208 11/07/18 0734  GLUCAP 158* 165*   Lipid Profile: No results for input(s): CHOL, HDL, LDLCALC, TRIG, CHOLHDL, LDLDIRECT in the last 72 hours. Thyroid Function Tests: Recent Labs    11/07/18 0845  TSH 1.393   Anemia  Panel: No results for input(s): VITAMINB12, FOLATE, FERRITIN, TIBC, IRON, RETICCTPCT in the last 72 hours.    Radiology Studies: I have reviewed all of the imaging during this hospital visit personally     Scheduled Meds: . enoxaparin (LOVENOX) injection  0.5 mg/kg Subcutaneous Q24H  . insulin aspart  0-20 Units Subcutaneous TID WC  . nystatin   Topical BID   Continuous Infusions: . 0.9 % NaCl with KCl 20 mEq / L 100 mL/hr at 11/07/18 0542  . ceFEPime (MAXIPIME) IV 2 g (11/07/18 0846)  . metronidazole 500 mg (11/07/18 1053)  . vancomycin       LOS: 0 days        Mauricio Gerome Apley, MD

## 2018-11-07 NOTE — ED Provider Notes (Signed)
Labs reviewed.  Patient will be treated for sepsis.  Unknown cause.CRITICAL CARE Performed by: Milton Ferguson Total critical care time: 35 minutes Critical care time was exclusive of separately billable procedures and treating other patients. Critical care was necessary to treat or prevent imminent or life-threatening deterioration. Critical care was time spent personally by me on the following activities: development of treatment plan with patient and/or surrogate as well as nursing, discussions with consultants, evaluation of patient's response to treatment, examination of patient, obtaining history from patient or surrogate, ordering and performing treatments and interventions, ordering and review of laboratory studies, ordering and review of radiographic studies, pulse oximetry and re-evaluation of patient's condition.    Milton Ferguson, MD 11/07/18 [phone]

## 2018-11-07 NOTE — Progress Notes (Signed)
ANTIBIOTIC CONSULT NOTE-Preliminary  Pharmacy Consult for aztreonam and vancomycin Indication: sepsis  Allergies  Allergen Reactions  . Doxycycline Itching  . Adhesive [Tape] Other (See Comments)    Blisters skin  . Biaxin [Clarithromycin] Other (See Comments)    Really bad yeast infection  . Percocet [Oxycodone-Acetaminophen] Itching  . Xarelto [Rivaroxaban] Itching  . Clarithromycin Rash    Yeast Infection  . Clindamycin/Lincomycin Rash    Yeast Infection   . Neosporin [Neomycin-Polymyxin B Gu] Rash    Eyes Drops only  . Penicillins Rash    Has patient had a PCN reaction causing immediate rash, facial/tongue/throat swelling, SOB or lightheadedness with hypotension: Yes Has patient had a PCN reaction causing severe rash involving mucus membranes or skin necrosis: No Has patient had a PCN reaction that required hospitalization: No Has patient had a PCN reaction occurring within the last 10 years: No If all of the above answers are "NO", then may proceed with Cephalosporin use.     Patient Measurements: Height: 5' 4"  (162.6 cm) Weight: 249 lb (112.9 kg) IBW/kg (Calculated) : 54.7 Adjusted Body Weight:   Vital Signs: Temp: 99.8 F (37.7 C) (05/19 2211) Temp Source: Oral (05/19 2211) BP: 112/76 (05/20 0200) Pulse Rate: 107 (05/20 0215)  Labs: Recent Labs    11/06/18 2257 11/07/18 0112  WBC 31.2*  --   HGB 8.5*  --   PLT 201  --   CREATININE 0.87 0.77    Estimated Creatinine Clearance: 87.5 mL/min (by C-G formula based on SCr of 0.77 mg/dL).  No results for input(s): VANCOTROUGH, VANCOPEAK, VANCORANDOM, GENTTROUGH, GENTPEAK, GENTRANDOM, TOBRATROUGH, TOBRAPEAK, TOBRARND, AMIKACINPEAK, AMIKACINTROU, AMIKACIN in the last 72 hours.   Microbiology: Recent Results (from the past 720 hour(s))  SARS Coronavirus 2 (CEPHEID - Performed in [place] hospital lab), Hosp Order     Status: None   Collection Time: 11/07/18 12:32 AM  Result Value Ref Range Status   SARS  Coronavirus 2 NEGATIVE NEGATIVE Final    Comment: (NOTE) If result is NEGATIVE SARS-CoV-2 target nucleic acids are NOT DETECTED. The SARS-CoV-2 RNA is generally detectable in upper and lower  respiratory specimens during the acute phase of infection. The lowest  concentration of SARS-CoV-2 viral copies this assay can detect is 250  copies / mL. A negative result does not preclude SARS-CoV-2 infection  and should not be used as the sole basis for treatment or other  patient management decisions.  A negative result may occur with  improper specimen collection / handling, submission of specimen other  than nasopharyngeal swab, presence of viral mutation(s) within the  areas targeted by this assay, and inadequate number of viral copies  (<250 copies / mL). A negative result must be combined with clinical  observations, patient history, and epidemiological information. If result is POSITIVE SARS-CoV-2 target nucleic acids are DETECTED. The SARS-CoV-2 RNA is generally detectable in upper and lower  respiratory specimens dur ing the acute phase of infection.  Positive  results are indicative of active infection with SARS-CoV-2.  Clinical  correlation with patient history and other diagnostic information is  necessary to determine patient infection status.  Positive results do  not rule out bacterial infection or co-infection with other viruses. If result is PRESUMPTIVE POSTIVE SARS-CoV-2 nucleic acids MAY BE PRESENT.   A presumptive positive result was obtained on the submitted specimen  and confirmed on repeat testing.  While 2019 novel coronavirus  (SARS-CoV-2) nucleic acids may be present in the submitted sample  additional confirmatory testing  may be necessary for epidemiological  and / or clinical management purposes  to differentiate between  SARS-CoV-2 and other Sarbecovirus currently known to infect humans.  If clinically indicated additional testing with an alternate test   methodology [phone]) is advised. The SARS-CoV-2 RNA is generally  detectable in upper and lower respiratory sp ecimens during the acute  phase of infection. The expected result is Negative. Fact Sheet for Patients:  StrictlyIdeas.no Fact Sheet for Healthcare Providers: BankingDealers.co.za This test is not yet approved or cleared by the [place] FDA and has been authorized for detection and/or diagnosis of SARS-CoV-2 by FDA under an Emergency Use Authorization (EUA).  This EUA will remain in effect (meaning this test can be used) for the duration of the COVID-19 declaration under Section 564(b)(1) of the Act, 21 U.S.C. section 360bbb-3(b)(1), unless the authorization is terminated or revoked sooner. Performed at [facility], [address], [place], [place] [address]     Medical History: Past Medical History:  Diagnosis Date  . Anemia   . Anxiety   . Arthritis   . Asthma   . Atrial fibrillation ([place])   . Cirrhosis of liver ([place])   . COPD (chronic obstructive pulmonary disease) ([place])   . Depression   . Edema   . Essential hypertension, benign   . GERD (gastroesophageal reflux disease)   . Hypothyroidism   . Morbid obesity ([place])   . Sleep apnea    CPAP - not consistent  . Thyroid cancer ([place])   . Type 2 diabetes mellitus ([facility])     Medications:  Anti-infectives (From admission, onward)   Start     Dose/Rate Route Frequency Ordered Stop   11/07/18 0330  vancomycin (VANCOCIN) IVPB 1000 mg/200 mL premix     1,000 mg 200 mL/hr over 60 Minutes Intravenous  Once 11/07/18 0238     11/07/18 0230  aztreonam (AZACTAM) 2 g in sodium chloride 0.9 % 100 mL IVPB     2 g 200 mL/hr over 30 Minutes Intravenous  Once 11/07/18 0219     11/07/18 0215  metroNIDAZOLE (FLAGYL) IVPB 500 mg     500 mg 100 mL/hr over 60 Minutes Intravenous Every 8 hours 11/07/18 0201     11/07/18 0045  vancomycin (VANCOCIN) IVPB 1000 mg/200 mL premix      1,000 mg 200 mL/hr over 60 Minutes Intravenous  Once 11/07/18 0037     11/07/18 0045  ciprofloxacin (CIPRO) IVPB 400 mg     400 mg 200 mL/hr over 60 Minutes Intravenous  Once 11/07/18 0040 11/07/18 0219      Assessment: 63 yo female presented to ED with hyperglycemia.  Pharmacy has been asked to dose vancomycin and aztreonam for presumed sepsis of unknown source.    Goal of Therapy:  Vancomycin trough level 15-20 mcg/ml  Plan:  Preliminary review of pertinent patient information completed.  Protocol will be initiated with dose(s) of aztreonam 2 grams and an additional 1 gram of vancomycin for 2 gram total loading dose.  Forestine Na clinical pharmacist will complete review during morning rounds to assess patient and finalize treatment regimen if needed.  Lamyah Creed Scarlett, RPH 11/07/2018,2:56 AM

## 2018-11-07 NOTE — Progress Notes (Signed)
Pharmacy Antibiotic Note  [name] [name] is a 63 y.o. female admitted on 11/06/2018 with infection- unknown source.  Pharmacy has been consulted for Vancomycin and Cefepime dosing.  Plan: Vancomycin 2000 mg IV x 1 dose. Vancomycin 1000 mg IV every 12 hours.  Goal trough 15-20 mcg/mL.  Cefepime 2000 mg IV every 8 hours. Monitor labs, c/s, and vanco levels as indicated.  Height: 5' 4"  (162.6 cm) Weight: 249 lb (112.9 kg) IBW/kg (Calculated) : 54.7  Temp (24hrs), Avg:99.3 F (37.4 C), Min:98.9 F (37.2 C), Max:99.8 F (37.7 C)  Recent Labs  Lab 11/06/18 2257 11/07/18 0112 11/07/18 0434  WBC 31.2*  --   --   CREATININE 0.87 0.77  --   LATICACIDVEN 1.9 2.5* 1.2    Estimated Creatinine Clearance: 87.5 mL/min (by C-G formula based on SCr of 0.77 mg/dL).    Allergies  Allergen Reactions  . Doxycycline Itching  . Adhesive [Tape] Other (See Comments)    Blisters skin  . Biaxin [Clarithromycin] Other (See Comments)    Really bad yeast infection  . Percocet [Oxycodone-Acetaminophen] Itching  . Xarelto [Rivaroxaban] Itching  . Clarithromycin Rash    Yeast Infection  . Clindamycin/Lincomycin Rash    Yeast Infection   . Neosporin [Neomycin-Polymyxin B Gu] Rash    Eyes Drops only  . Penicillins Rash    Has patient had a PCN reaction causing immediate rash, facial/tongue/throat swelling, SOB or lightheadedness with hypotension: Yes Has patient had a PCN reaction causing severe rash involving mucus membranes or skin necrosis: No Has patient had a PCN reaction that required hospitalization: No Has patient had a PCN reaction occurring within the last 10 years: No If all of the above answers are "NO", then may proceed with Cephalosporin use.     Antimicrobials this admission: Vanco 5/20 >>  Cefepime 5/20 >>  Flagyl 5/20 >>  Aztreonam 5/20 >> 5/20  Dose adjustments this admission: N/A  Microbiology results: 5/20 BCx: ngtd   Thank you for allowing pharmacy to be a part of  this patient's care.  Ramond Craver 11/07/2018 8:39 AM

## 2018-11-07 NOTE — H&P (Signed)
History and Physical    [name] [name]:[id number] DOB: May 15, 1956 DOA: 11/06/2018  PCP: Redmond School, MD   Patient coming from: Home.  I have personally briefly reviewed patient's old medical records in [place]  Chief Complaint: Headache, high BP and high blood sugar.  HPI: [name] [name] is a 63 y.o. female with medical history significant of anemia, anxiety/depression, osteoarthritis, asthma, chronic atrial fibrillation, nonalcoholic cirrhosis of the liver, COPD, erythema/lymphedema, essential hypertension, GERD, hypothyroidism/thyroidectomy/history of thyroid cancer, morbid obesity, sleep apnea not on CPAP, type 2 diabetes mellitus who is coming to the emergency department due to headache and not feeling well. She states that since earlier in the day she started having pan-cranial headache, which is more intense on her frontal area.  She denies fever, but complains of chills, fatigue and malaise.  She denies rhinorrhea, sore throat, productive cough, wheezing or hemoptysis.  Denies chest pain, palpitations, diaphoresis, PND, orthopnea, but complains of occasional lower bleeding extremity edema.  She states that she has been nauseated, with decreased appetite, but denies emesis, abdominal pain, diarrhea, constipation, melena or hematochezia.  She denies dysuria, frequency or hematuria.  She feels thirsty, but denies polyuria or blurred vision.  She told Dr. Eulis Foster that she was thirsty and wanted a soda.  She arrived with a sixpack of Dr. [name] to the emergency department.  She complains of pruritus particularly of lower abdomen and upper extremities.  ED Course: Initial vital signs were temperature 99.8 F, pulse 102, respirations 20, blood pressure 165/57 mmHg.  The patient received 2000 mL of NS bolus, ciprofloxacin and vancomycin in the emergency department.  I added another 1500 mL of normal saline bolus and Toradol 30 mg IVP x1 for headache.  Her urinalysis was normal.  Her  CBC showed a white count was 31.2 with 92% neutrophils, hemoglobin 8.5 g/dL and platelets 201.  Sodium was 130, potassium 4.2, chloride 95 and CO2 24 mmol/L.  Renal function was normal.  Glucose 184 mg/dL.  Albumin was 3.2 g/dL and alkaline phosphatase slightly elevated 166 units/L, otherwise all other hepatic function tests are within normal limits.  Lactic acid #1 was 1.9 and follow-up was 2.5 mmol/L.  Her BMP was 106 pg/mL.  Imaging: Head CT was mildly motion degraded, but he was negative otherwise.  Chest radiograph shows cardiomegaly with increased compared to prior mild central vascular congestion.  There is aortic atherosclerosis.  Review of Systems: As per HPI otherwise 10 point review of systems negative.   Past Medical History:  Diagnosis Date  . Anemia   . Anxiety   . Arthritis   . Asthma   . Atrial fibrillation ([place])   . Cirrhosis of liver ([place])   . COPD (chronic obstructive pulmonary disease) ([place])   . Depression   . Edema   . Essential hypertension, benign   . GERD (gastroesophageal reflux disease)   . Hypothyroidism   . Morbid obesity ([place])   . Sleep apnea    CPAP - not consistent  . Thyroid cancer ([place])   . Type 2 diabetes mellitus ([place])     Past Surgical History:  Procedure Laterality Date  . "pump bumps"     bilateral heels--2000  . ABDOMINAL HYSTERECTOMY  1985  . CARDIAC CATHETERIZATION  2012   Dr. Lia Foyer told her nothing was wrong.  [name] [place]  . CHOLECYSTECTOMY  1996  . COLONOSCOPY WITH PROPOFOL N/A 04/17/2015   Procedure: COLONOSCOPY WITH PROPOFOL  at cecum at 0810; withdrawal  time =15 minutes;  Surgeon: [name], MD;  Location: [facility];  Service: Endoscopy;  Laterality: N/A;  . Debridement of abdominal wound  2011  . ESOPHAGEAL DILATION N/A 04/17/2015   Procedure: ESOPHAGEAL DILATION WITH 56FR MALONEY DILATOR;  Surgeon: [name], MD;  Location: [facility];  Service: Endoscopy;  Laterality: N/A;  . ESOPHAGOGASTRODUODENOSCOPY (EGD)  WITH PROPOFOL N/A 04/17/2015   Procedure: ESOPHAGOGASTRODUODENOSCOPY (EGD) WITH PROPOFOL;  Surgeon: [name], MD;  Location: [facility];  Service: Endoscopy;  Laterality: N/A;  . EXPLORATORY LAPAROTOMY  2003  . FOOT SURGERY Bilateral 2000   Bone spur removed  . INCISIONAL HERNIA REPAIR N/A 06/09/2014   Procedure: RECURRENT  INCISIONAL HERNIORRHAPHY WITH MESH;  Surgeon: [name], MD;  Location: [facility];  Service: General;  Laterality: N/A;  . INCISIONAL HERNIA REPAIR N/A 10/15/2014   Procedure: [place];  Surgeon: [name], MD;  Location: [place];  Service: General;  Laterality: N/A;  . INSERTION OF MESH N/A 06/09/2014   Procedure: INSERTION OF MESH;  Surgeon: [name], MD;  Location: [facility];  Service: General;  Laterality: N/A;  . INSERTION OF MESH N/A 10/15/2014   Procedure: INSERTION OF MESH;  Surgeon: [name], MD;  Location: [place];  Service: General;  Laterality: N/A;  . POLYPECTOMY  04/17/2015   Procedure: POLYPECTOMY;  Surgeon: [name], MD;  Location: [facility];  Service: Endoscopy;;  . RESECTION DISTAL CLAVICAL  05/07/2012   Procedure: RESECTION DISTAL CLAVICAL;  Surgeon: [name], MD;  Location: [facility];  Service: Orthopedics;  Laterality: Right;  . SHOULDER OPEN ROTATOR CUFF REPAIR  05/07/2012   Procedure: ROTATOR CUFF REPAIR SHOULDER OPEN;  Surgeon: [name], MD;  Location: [facility];  Service: Orthopedics;  Laterality: Right;  . THYROIDECTOMY  2009  . TONSILLECTOMY  1958  . UMBILICAL HERNIA REPAIR  2010     reports that she has never smoked. She has never used smokeless tobacco. She reports that she does not drink alcohol or use drugs.  Allergies  Allergen Reactions  . Doxycycline Itching  . Adhesive [Tape] Other (See Comments)    Blisters skin  . Biaxin [Clarithromycin] Other (See Comments)    Really bad yeast infection  . Percocet [Oxycodone-Acetaminophen] Itching  . Xarelto [Rivaroxaban]  Itching  . Clarithromycin Rash    Yeast Infection  . Clindamycin/Lincomycin Rash    Yeast Infection   . Neosporin [Neomycin-Polymyxin B Gu] Rash    Eyes Drops only  . Penicillins Rash    Has patient had a PCN reaction causing immediate rash, facial/tongue/throat swelling, SOB or lightheadedness with hypotension: Yes Has patient had a PCN reaction causing severe rash involving mucus membranes or skin necrosis: No Has patient had a PCN reaction that required hospitalization: No Has patient had a PCN reaction occurring within the last 10 years: No If all of the above answers are "NO", then may proceed with Cephalosporin use.     Family History  Problem Relation Age of Onset  . Heart disease Other   . Arthritis Other   . Cancer Other   . Asthma Other   . Diabetes Other   . Kidney disease Other    Prior to Admission medications   Medication Sig Start Date End Date Taking? Authorizing Provider  apixaban (ELIQUIS) 5 MG TABS tablet Take 1 tablet (5 mg total) by mouth 2 (two) times daily. 04/10/18  Yes Satira Sark, MD  colesevelam [facility]) 625 MG  tablet Take 1,875 mg by mouth 2 (two) times daily with a meal. 3 tabs am 3 tabs pm   Yes [provider]  diltiazem (CARDIZEM CD) 360 MG 24 hr capsule Take 1 capsule (360 mg total) by mouth daily. 03/27/18  Yes Satira Sark, MD  DULoxetine (CYMBALTA) 30 MG capsule Take 30 mg by mouth daily.  01/18/17  Yes [provider]  DULoxetine (CYMBALTA) 60 MG capsule Take 60 mg by mouth daily.  03/20/15  Yes [provider]  gabapentin (NEURONTIN) 300 MG capsule Take 300 mg by mouth daily. 10/19/18  Yes [provider]  hydrOXYzine (ATARAX/VISTARIL) 25 MG tablet Take 25 mg by mouth 4 (four) times daily as needed. 10/12/18  Yes [provider]  LANTUS SOLOSTAR 100 UNIT/ML Solostar Pen Inject into the skin daily.  09/21/18  Yes [provider]  levothyroxine (SYNTHROID) 75 MCG tablet Take 50-75 mcg by  mouth daily before breakfast. DOSE NEEDS TO BE VERIFIED!   Yes [provider]  lisinopril (PRINIVIL,ZESTRIL) 20 MG tablet Take 20 mg by mouth daily.  01/18/17  Yes [provider]  losartan (COZAAR) 100 MG tablet Take 100 mg by mouth daily.  04/27/18  Yes [provider]  meloxicam (MOBIC) 15 MG tablet Take 15 mg by mouth daily.  10/22/15  Yes [provider]  metFORMIN (GLUCOPHAGE) 1000 MG tablet Take 1,000 mg by mouth 2 (two) times daily with a meal.   Yes [provider]  nystatin (NYSTOP) 100000 UNIT/GM POWD Apply 100,000 g topically daily as needed (for rash-irritation). Rash 08/18/10  Yes [provider]  pravastatin (PRAVACHOL) 20 MG tablet Take 20 mg by mouth daily. 10/30/18  Yes [provider]  SYNTHROID 200 MCG tablet Take 1 tablet by mouth daily. DOSE NEEDS TO BE VERIFIED 11/16/17  Yes [provider]  tolterodine (DETROL LA) 4 MG 24 hr capsule Take 4 mg by mouth 2 (two) times a day. 08/17/18  Yes [provider]  traZODone (DESYREL) 100 MG tablet Take 100-200 mg by mouth at bedtime. 09/21/18  Yes [provider]  albuterol (PROVENTIL HFA) 108 (90 BASE) MCG/ACT inhaler Inhale 2 puffs into the lungs every 6 (six) hours as needed for wheezing or shortness of breath. Shortness of breath    [provider]  econazole nitrate 1 % cream Apply topically daily. Patient taking differently: Apply 1 application topically daily as needed (for rash irritation).  12/09/15   Tuchman, Richard C, DPM  furosemide (LASIX) 20 MG tablet Take 20 mg by mouth daily as needed for fluid.     [provider]  HYDROcodone-acetaminophen (NORCO) 10-325 MG tablet Take 1 tablet by mouth every hour as needed for moderate pain.     [provider]  HYDROmorphone (DILAUDID) 4 MG tablet Take 4 mg by mouth every 6 (six) hours as needed for severe pain.  08/21/14   [provider]  nystatin cream (MYCOSTATIN) Apply  1 application topically 3 (three) times daily.  03/23/17   [provider]  pantoprazole (PROTONIX) 40 MG tablet Take 1 tablet (40 mg total) by mouth daily before breakfast. 03/28/17   Rehman, Mechele Dawley, MD  potassium chloride SA (K-DUR,KLOR-CON) 20 MEQ tablet Take 20 mEq by mouth 2 (two) times daily.  04/27/18   [provider]    Physical Exam: Vitals:   11/07/18 0145 11/07/18 0200 11/07/18 0215 11/07/18 0230  BP:  112/76  (!) 107/40  Pulse: (!) 105  (!) 107 [name])  114  Resp: (!) 23 (!) 28 (!) 28 (!) 26  Temp:      TempSrc:      SpO2: 100%  97% 96%  Weight:      Height:        Constitutional: Looks acutely ill. Eyes: PERRL, lids and conjunctivae mildly injected.  No icterus. ENMT: Mucous membranes are dry.  Posterior pharynx clear of any exudate or lesions. Neck: normal, supple, no masses, no thyromegaly Respiratory: Decreased breath sounds in bases, otherwise clear to auscultation bilaterally, no wheezing, no crackles. Normal respiratory effort. No accessory muscle use.  Cardiovascular: Tachycardic at 106 bpm, no murmurs / rubs / gallops. No extremity edema. 2+ pedal pulses. No carotid bruits.  Abdomen: Obese, soft, no tenderness, no masses palpated. No hepatosplenomegaly. Bowel sounds positive.  Musculoskeletal: no clubbing / cyanosis. Good ROM, no contractures. Normal muscle tone.  Skin: Multiple healing excoriations on abdomen and thighs.  There is mild erythema and foul-smelling yeast odor on lower abdomen and inguinal area folds. Neurologic: CN 2-12 grossly intact. Sensation intact, DTR normal. Strength 5/5 in all 4.  Psychiatric: Alert and oriented x 3.  Mildly anxious mood.   Labs on Admission: I have personally reviewed following labs and imaging studies  CBC: Recent Labs  Lab 11/06/18 2257  WBC 31.2*  NEUTROABS 28.6*  HGB 8.5*  HCT 29.0*  MCV 75.3*  PLT 604   Basic Metabolic Panel: Recent Labs  Lab 11/06/18 2257 11/07/18 0112  NA 130* 128*  K  4.2 3.8  CL 95* 95*  CO2 24 21*  GLUCOSE 184* 166*  BUN 14 13  CREATININE 0.87 0.77  CALCIUM 8.7* 8.1*   GFR: Estimated Creatinine Clearance: 87.5 mL/min (by C-G formula based on SCr of 0.77 mg/dL). Liver Function Tests: Recent Labs  Lab 11/06/18 2257 11/07/18 0112  AST 23 23  ALT 16 16  ALKPHOS 166* 152*  BILITOT 0.8 0.8  PROT 7.4 6.9  ALBUMIN 3.2* 2.9*   No results for input(s): LIPASE, AMYLASE in the last 168 hours. No results for input(s): AMMONIA in the last 168 hours. Coagulation Profile: No results for input(s): INR, PROTIME in the last 168 hours. Cardiac Enzymes: No results for input(s): CKTOTAL, CKMB, CKMBINDEX, TROPONINI in the last 168 hours. BNP (last 3 results) No results for input(s): PROBNP in the last 8760 hours. HbA1C: No results for input(s): HGBA1C in the last 72 hours. CBG: Recent Labs  Lab 11/06/18 2208  GLUCAP 158*   Lipid Profile: No results for input(s): CHOL, HDL, LDLCALC, TRIG, CHOLHDL, LDLDIRECT in the last 72 hours. Thyroid Function Tests: No results for input(s): TSH, T4TOTAL, FREET4, T3FREE, THYROIDAB in the last 72 hours. Anemia Panel: No results for input(s): VITAMINB12, FOLATE, FERRITIN, TIBC, IRON, RETICCTPCT in the last 72 hours. Urine analysis:    Component Value Date/Time   COLORURINE YELLOW 11/07/2018 0128   APPEARANCEUR CLEAR 11/07/2018 0128   LABSPEC 1.013 11/07/2018 0128   PHURINE 5.0 11/07/2018 0128   GLUCOSEU NEGATIVE 11/07/2018 0128   HGBUR NEGATIVE 11/07/2018 0128   BILIRUBINUR NEGATIVE 11/07/2018 0128   KETONESUR NEGATIVE 11/07/2018 0128   PROTEINUR NEGATIVE 11/07/2018 0128   UROBILINOGEN 0.2 07/21/2014 2230   NITRITE NEGATIVE 11/07/2018 0128   LEUKOCYTESUR NEGATIVE 11/07/2018 0128    Radiological Exams on Admission: Dg Chest 2 View  Result Date: 11/06/2018 CLINICAL DATA:  Altered mental status EXAM: CHEST - 2 VIEW COMPARISON:  09/04/2017 FINDINGS: Cardiomegaly, increased compared to prior. Vascular  congestion. No pleural effusion. Aortic atherosclerosis. No  pneumothorax. IMPRESSION: Cardiomegaly, increased compared to prior with mild central vascular congestion. Electronically Signed   By: Donavan Foil M.D.   On: 11/06/2018 23:31   Ct Head Wo Contrast  Result Date: 11/06/2018 CLINICAL DATA:  High blood sugar headache EXAM: CT HEAD WITHOUT CONTRAST TECHNIQUE: Contiguous axial images were obtained from the base of the skull through the vertex without intravenous contrast. COMPARISON:  None. FINDINGS: Brain: Mild motion degradation. No acute territorial infarction, hemorrhage, or intracranial mass. The ventricles are nonenlarged Vascular: No hyperdense vessels.  No unexpected calcification Skull: Normal. Negative for fracture or focal lesion. Sinuses/Orbits: No acute finding. Other: None IMPRESSION: Mild motion degraded study. Allowing for motion, negative non contrasted CT appearance of the brain Electronically Signed   By: Donavan Foil M.D.   On: 11/06/2018 23:28    EKG: Independently reviewed.  Vent. rate 101 BPM PR interval * ms QRS duration 86 ms QT/QTc 366/475 ms P-R-T axes 2 37 18 Sinus tachycardia Baseline wander in lead(s) V2  Assessment/Plan Principal problem:   Sepsis due to undetermined organism ([place]) Admit to telemetry/inpatient. Continue supplemental oxygen. Continue maintenance IV fluids. Aztreonam per pharmacy. Vancomycin per pharmacy. Metronidazole 500 mg IVPB every 8 hours. Follow-up blood culture and sensitivity. Follow-up CBC and chemistry. Follow-up lactic acid.  Active Problems:   Type 2 diabetes mellitus (HCC) Hemoglobin A1c on file. Lantus and metformin need reconciliation. CBG monitoring regular insulin sliding scale.    Essential hypertension, benign Hold antihypertensives for now. Monitor blood pressure.    Cirrhosis, nonalcoholic (HCC) No signs of decompensation. Monitor LFTs as needed.    Atrial fibrillation (HCC) CHA?DS?-VASc Score of at  least 4. Head CT was Continue apixaban once med rec done. Continue Cardizem CD once meds reconciliate it.    COPD (chronic obstructive pulmonary disease) (HCC) Asymptomatic at this time. Continue supplemental oxygen. Bronchodilators as needed.    GERD (gastroesophageal reflux disease) Continue daily oral pantoprazole.    Hypothyroidism Continue levothyroxine once dose confirmed. Check TSH.    Sleep apnea Currently not using CPAP. Continue supplemental oxygen.   DVT prophylaxis: Lovenox SQ. Code Status: Full code. Family Communication: Disposition Plan: Admit for IV antibiotic therapy and further work-up. Consults called: Admission status: Inpatient/telemetry.   Reubin Milan MD [facility]  11/07/2018, 3:29 AM   This document was prepared using Dragon voice recognition software and may contain some unintended transcription errors.

## 2018-11-08 ENCOUNTER — Other Ambulatory Visit (HOSPITAL_COMMUNITY): Payer: PPO

## 2018-11-08 LAB — CBC WITH DIFFERENTIAL/PLATELET
Abs Immature Granulocytes: 0.07 10*3/uL (ref 0.00–0.07)
Basophils Absolute: 0 10*3/uL (ref 0.0–0.1)
Basophils Relative: 0 %
Eosinophils Absolute: 0.4 10*3/uL (ref 0.0–0.5)
Eosinophils Relative: 3 %
HCT: 26.8 % — ABNORMAL LOW (ref 36.0–46.0)
Hemoglobin: 7.6 g/dL — ABNORMAL LOW (ref 12.0–15.0)
Immature Granulocytes: 1 %
Lymphocytes Relative: 8 %
Lymphs Abs: 0.8 10*3/uL (ref 0.7–4.0)
MCH: 21.4 pg — ABNORMAL LOW (ref 26.0–34.0)
MCHC: 28.4 g/dL — ABNORMAL LOW (ref 30.0–36.0)
MCV: 75.5 fL — ABNORMAL LOW (ref 80.0–100.0)
Monocytes Absolute: 0.4 10*3/uL (ref 0.1–1.0)
Monocytes Relative: 3 %
Neutro Abs: 8.8 10*3/uL — ABNORMAL HIGH (ref 1.7–7.7)
Neutrophils Relative %: 85 %
Platelets: 129 10*3/uL — ABNORMAL LOW (ref 150–400)
RBC: 3.55 MIL/uL — ABNORMAL LOW (ref 3.87–5.11)
RDW: 16.4 % — ABNORMAL HIGH (ref 11.5–15.5)
WBC: 10.4 10*3/uL (ref 4.0–10.5)
nRBC: 0 % (ref 0.0–0.2)

## 2018-11-08 LAB — BASIC METABOLIC PANEL
Anion gap: 8 (ref 5–15)
BUN: 13 mg/dL (ref 8–23)
CO2: 22 mmol/L (ref 22–32)
Calcium: 8.1 mg/dL — ABNORMAL LOW (ref 8.9–10.3)
Chloride: 103 mmol/L (ref 98–111)
Creatinine, Ser: 0.74 mg/dL (ref 0.44–1.00)
GFR calc Af Amer: >60 mL/min (ref >60)
GFR calc non Af Amer: >60 mL/min (ref >60)
Glucose, Bld: 153 mg/dL — ABNORMAL HIGH (ref 70–99)
Potassium: 3.6 mmol/L (ref 3.5–5.1)
Sodium: 133 mmol/L — ABNORMAL LOW (ref 135–145)

## 2018-11-08 LAB — GLUCOSE, CAPILLARY
Glucose-Capillary: 136 mg/dL — ABNORMAL HIGH (ref 70–99)
Glucose-Capillary: 158 mg/dL — ABNORMAL HIGH (ref 70–99)
Glucose-Capillary: 139 mg/dL — ABNORMAL HIGH (ref 70–99)
Glucose-Capillary: 114 mg/dL — ABNORMAL HIGH (ref 70–99)

## 2018-11-08 LAB — HIV ANTIBODY (ROUTINE TESTING W REFLEX): HIV Screen 4th Generation wRfx: NONREACTIVE

## 2018-11-08 MED ORDER — SODIUM CHLORIDE 0.9 % IV SOLN
2.0000 g | INTRAVENOUS | Status: DC
  Administered 2018-11-08 – 2018-11-09 (×2): 2 g via INTRAVENOUS
  Filled 2018-11-08 (×2): qty 20

## 2018-11-08 MED ORDER — POTASSIUM CHLORIDE CRYS ER 20 MEQ PO TBCR
40.0000 meq | EXTENDED_RELEASE_TABLET | Freq: Once | ORAL | Status: AC
  Administered 2018-11-08: 40 meq via ORAL
  Filled 2018-11-08 (×2): qty 2

## 2018-11-08 NOTE — Progress Notes (Signed)
PROGRESS NOTE    [name] [name]  EOF:[id number] DOB: 02-Nov-1955 DOA: 11/06/2018 PCP: Redmond School, MD    Brief Narrative:  63 year old female who presented with elevated glucose and headache.  She has a significant past medical history for anemia, osteoarthritis, asthma, chronic atrial fibrillation, nonalcoholic cirrhosis of the liver, COPD, hypertension, morbid obesity, type 2 diabetes mellitus and history of thyroid cancer.  She reported sudden onset of generalized headache, predominantly in the frontal area, associated with nausea and decreased appetite.  On her initial physical examination blood pressure was 107/40, heart rate 114, respiratory rate 28, oxygen saturation 97%, she was ill looking appearing, dry mucous membranes, decreased breath sounds at bases bilaterally, heart S1-2 present, tachycardic, abdomen soft nontender, multiple healing excoriation in the abdomen and thighs.  Mild erythema and foul-smelling disorder on lower abdomen and inguinal folds.  Sodium 130, potassium 4.2, chloride 95, bicarb 24, glucose 194, BUN 14, creatinine 0.87, bicarb 31.2, hemoglobin 8.5, hematocrit 29.0, platelets 201, urinalysis negative for infection.  Head CT negative for acute changes.  Chest x-ray with cardiomegaly increased vascular congestion.  EKG with 101 bpm, normal axis, sinus with normal intervals, no ST segment or T wave changes.  Patient was admitted to the hospital with the working diagnosis of sepsis due to lower extremity cellulitis   Assessment & Plan:   Principal Problem:   Sepsis due to undetermined organism [facility]) Active Problems:   Type 2 diabetes mellitus ([place])   Essential hypertension, benign   Cirrhosis, nonalcoholic (HCC)   Atrial fibrillation (HCC)   COPD (chronic obstructive pulmonary disease) (HCC)   GERD (gastroesophageal reflux disease)   Hypothyroidism   Sleep apnea   1. Sepsis due to cellulitis/ left lower extremity with streptococcal bacteremia. Persistent  erythematous rash on left lower extremity. Wbc down to 10,4, patient has been afebrile, blood culture positive for streptococcal species. Will continue IV antibiotic therapy with Ceftriaxone. Will follow up cultures in am. Discontinue IV fluids for now.   2. HTN. Blood pressure 135/68 mmHg today, will continue to hold on lisinopril and furosemide.   3. T2Dm. Fasting glucose this am is 153, patient is tolerating po well, will continue glucose cover and monitoring with insulin sliding scale.   4. Paroxysmal atrial fibrillation. Continue diltiazem for rate control, anticoagulation with apixaban.   5. COPD. No current signs of acute exacerbation.   6. Hypothyroid. On levothyroxine.   7. Morbid obesity. BMI is 42,7. Will need outpatient follow up. Positive ambulatory dysfunction, will have physical therapy evaluation.    8. Depression. Continue duloxetine.   9. Hypokalemia. Continue K correction with kcl, 40 meq po x1, for serum K at 3,6, will follow on renal panel in am. Preserved renal function with serum cr at 0.74.   DVT prophylaxis: apixaban  Code Status:  full Family Communication: no family at the bedside  Disposition Plan/ discharge barriers: pending clinical improvement.   Body mass index is 42.74 kg/m. Malnutrition Type:      Malnutrition Characteristics:      Nutrition Interventions:     RN Pressure Injury Documentation:     Consultants:     Procedures:     Antimicrobials:   IV Cefepime 05/20 to 05/21  IV ceftriaxone started 05/21    Subjective: Patient is feeling better but still not back to baseline, continue to be somnolent and deconditioned, no nausea or vomiting, no chest pain or dyspnea. Ambulates with walker, also has a wheelchair at home.   Objective: Vitals:   11/07/18  1856 11/07/18 1957 11/07/18 2047 11/08/18 0542  BP: (!) 155/62  (!) 160/65 135/68  Pulse:   98 94  Resp:   18 18  Temp:   99.7 F (37.6 C) 98.6 F (37 C)   TempSrc:   Oral Oral  SpO2:  93% 100% 100%  Weight:      Height:        Intake/Output Summary (Last 24 hours) at 11/08/2018 0945 Last data filed at 11/08/2018 0500 Gross per 24 hour  Intake 1644.15 ml  Output 2450 ml  Net -805.85 ml   Filed Weights   11/06/18 2214  Weight: 112.9 kg    Examination:   General: deconditioned and ill looking appearing  Neurology: Awake and alert, non focal  E ENT: mild pallor, no icterus, oral mucosa moist Cardiovascular: No JVD. S1-S2 present, rhythmic, no gallops, rubs, or murmurs. Tracelower extremity edema. Pulmonary: positive breath sounds bilaterally, adequate air movement, no wheezing, rhonchi or rales. Gastrointestinal. Abdomen protuberant with no organomegaly, non tender, no rebound or guarding Skin. Left lower extremity erythema with ill defined markings, increased local temperature, non tender or purulent.  Musculoskeletal: no joint deformities/ knee hypertrophy.      Data Reviewed: I have personally reviewed following labs and imaging studies  CBC: Recent Labs  Lab 11/06/18 2257 11/07/18 0845 11/08/18 0616  WBC 31.2* 20.4* 10.4  NEUTROABS 28.6* 18.9* 8.8*  HGB 8.5* 8.3* 7.6*  HCT 29.0* 28.6* 26.8*  MCV 75.3* 74.7* 75.5*  PLT 201 146* 248*   Basic Metabolic Panel: Recent Labs  Lab 11/06/18 2257 11/07/18 0112 11/07/18 0845 11/08/18 0616  NA 130* 128* 130* 133*  K 4.2 3.8 3.3* 3.6  CL 95* 95* 99 103  CO2 24 21* 22 22  GLUCOSE 184* 166* 160* 153*  BUN 14 13 11 13   CREATININE 0.87 0.77 0.76 0.74  CALCIUM 8.7* 8.1* 7.7* 8.1*   GFR: Estimated Creatinine Clearance: 87.5 mL/min (by C-G formula based on SCr of 0.74 mg/dL). Liver Function Tests: Recent Labs  Lab 11/06/18 2257 11/07/18 0112  AST 23 23  ALT 16 16  ALKPHOS 166* 152*  BILITOT 0.8 0.8  PROT 7.4 6.9  ALBUMIN 3.2* 2.9*   No results for input(s): LIPASE, AMYLASE in the last 168 hours. No results for input(s): AMMONIA in the last 168 hours.  Coagulation Profile: No results for input(s): INR, PROTIME in the last 168 hours. Cardiac Enzymes: No results for input(s): CKTOTAL, CKMB, CKMBINDEX, TROPONINI in the last 168 hours. BNP (last 3 results) No results for input(s): PROBNP in the last 8760 hours. HbA1C: No results for input(s): HGBA1C in the last 72 hours. CBG: Recent Labs  Lab 11/07/18 0734 11/07/18 1112 11/07/18 1859 11/07/18 2107 11/08/18 0742  GLUCAP 165* 167* 129* 109* 136*   Lipid Profile: No results for input(s): CHOL, HDL, LDLCALC, TRIG, CHOLHDL, LDLDIRECT in the last 72 hours. Thyroid Function Tests: Recent Labs    11/07/18 0845  TSH 1.393   Anemia Panel: No results for input(s): VITAMINB12, FOLATE, FERRITIN, TIBC, IRON, RETICCTPCT in the last 72 hours.    Radiology Studies: I have reviewed all of the imaging during this hospital visit personally     Scheduled Meds: . apixaban  5 mg Oral BID  . diltiazem  360 mg Oral Daily  . DULoxetine  60 mg Oral Daily  . fesoterodine  4 mg Oral Daily  . gabapentin  300 mg Oral Daily  . insulin aspart  0-20 Units Subcutaneous TID WC  . levothyroxine  275 mcg Oral QAC breakfast  . nystatin   Topical BID  . pantoprazole  40 mg Oral QAC breakfast  . pravastatin  20 mg Oral Daily  . traZODone  100-200 mg Oral QHS   Continuous Infusions: . 0.9 % NaCl with KCl 20 mEq / L 50 mL/hr at 11/07/18 2038  . ceFEPime (MAXIPIME) IV 2 g (11/08/18 0306)     LOS: 1 day        Eugune Sine Gerome Apley, MD

## 2018-11-09 LAB — CBC WITH DIFFERENTIAL/PLATELET
Abs Immature Granulocytes: 0.06 10*3/uL (ref 0.00–0.07)
Basophils Absolute: 0 10*3/uL (ref 0.0–0.1)
Basophils Relative: 0 %
Eosinophils Absolute: 0.2 10*3/uL (ref 0.0–0.5)
Eosinophils Relative: 2 %
HCT: 26.4 % — ABNORMAL LOW (ref 36.0–46.0)
Hemoglobin: 7.2 g/dL — ABNORMAL LOW (ref 12.0–15.0)
Immature Granulocytes: 1 %
Lymphocytes Relative: 10 %
Lymphs Abs: 0.9 10*3/uL (ref 0.7–4.0)
MCH: 20.6 pg — ABNORMAL LOW (ref 26.0–34.0)
MCHC: 27.3 g/dL — ABNORMAL LOW (ref 30.0–36.0)
MCV: 75.4 fL — ABNORMAL LOW (ref 80.0–100.0)
Monocytes Absolute: 0.6 10*3/uL (ref 0.1–1.0)
Monocytes Relative: 6 %
Neutro Abs: 7.3 10*3/uL (ref 1.7–7.7)
Neutrophils Relative %: 81 %
Platelets: 156 10*3/uL (ref 150–400)
RBC: 3.5 MIL/uL — ABNORMAL LOW (ref 3.87–5.11)
RDW: 16.3 % — ABNORMAL HIGH (ref 11.5–15.5)
WBC: 9 10*3/uL (ref 4.0–10.5)
nRBC: 0 % (ref 0.0–0.2)

## 2018-11-09 LAB — GLUCOSE, CAPILLARY
Glucose-Capillary: 144 mg/dL — ABNORMAL HIGH (ref 70–99)
Glucose-Capillary: 163 mg/dL — ABNORMAL HIGH (ref 70–99)
Glucose-Capillary: 133 mg/dL — ABNORMAL HIGH (ref 70–99)
Glucose-Capillary: 150 mg/dL — ABNORMAL HIGH (ref 70–99)

## 2018-11-09 LAB — BASIC METABOLIC PANEL
Anion gap: 7 (ref 5–15)
BUN: 13 mg/dL (ref 8–23)
CO2: 22 mmol/L (ref 22–32)
Calcium: 8.4 mg/dL — ABNORMAL LOW (ref 8.9–10.3)
Chloride: 104 mmol/L (ref 98–111)
Creatinine, Ser: 0.67 mg/dL (ref 0.44–1.00)
GFR calc Af Amer: >60 mL/min (ref >60)
GFR calc non Af Amer: >60 mL/min (ref >60)
Glucose, Bld: 165 mg/dL — ABNORMAL HIGH (ref 70–99)
Potassium: 4.1 mmol/L (ref 3.5–5.1)
Sodium: 133 mmol/L — ABNORMAL LOW (ref 135–145)

## 2018-11-09 LAB — CULTURE, BLOOD (ROUTINE X 2): Special Requests: ADEQUATE

## 2018-11-09 NOTE — Evaluation (Signed)
Physical Therapy Evaluation Patient Details Name: [name] [name] MRN: [id number] DOB: May 08, 1956 Today's Date: 11/09/2018   History of Present Illness  [name] [name] is a 63 y.o. female with medical history significant of anemia, anxiety/depression, osteoarthritis, asthma, chronic atrial fibrillation, nonalcoholic cirrhosis of the liver, COPD, erythema/lymphedema, essential hypertension, GERD, hypothyroidism/thyroidectomy/history of thyroid cancer, morbid obesity, sleep apnea not on CPAP, type 2 diabetes mellitus who is coming to the emergency department due to headache and not feeling well. She states that since earlier in the day she started having pan-cranial headache, which is more intense on her frontal area.  She denies fever, but complains of chills, fatigue and malaise.  She denies rhinorrhea, sore throat, productive cough, wheezing or hemoptysis.  Denies chest pain, palpitations, diaphoresis, PND, orthopnea, but complains of occasional lower bleeding extremity edema.  She states that she has been nauseated, with decreased appetite, but denies emesis, abdominal pain, diarrhea, constipation, melena or hematochezia.  She denies dysuria, frequency or hematuria.  She feels thirsty, but denies polyuria or blurred vision.  She told Dr. Eulis Foster that she was thirsty and wanted a soda.  She arrived with a sixpack of Dr. [name] to the emergency department.  She complains of pruritus particularly of lower abdomen and upper extremities.    Clinical Impression  Patient functioning near baseline for functional mobility and gait, had difficulty sitting up at bedside demonstrating slow labored movement for scooting forward, demonstrates good return for sit to stands, transfers and ambulating with quad-cane without loss of balance, limited secondary to c/o fatigue and tolerated sitting up in chair after therapy - RN aware.  Patient will benefit from continued physical therapy in hospital and recommended venue below  to increase strength, balance, endurance for safe ADLs and gait.     Follow Up Recommendations Outpatient PT;Supervision - Intermittent    Equipment Recommendations  None recommended by PT    Recommendations for Other Services       Precautions / Restrictions Precautions Precautions: Fall Restrictions Weight Bearing Restrictions: No      Mobility  Bed Mobility Overal bed mobility: Needs Assistance Bed Mobility: Supine to Sit     Supine to sit: Supervision     General bed mobility comments: increased time, labored movement, head of bed raised (usually sleeps in a recliner)  Transfers Overall transfer level: Needs assistance Equipment used: Quad cane Transfers: Sit to/from [facility] Sit to Stand: Supervision;Min guard Stand pivot transfers: Supervision;Min guard       General transfer comment: increased time, labored movement  Ambulation/Gait Ambulation/Gait assistance: Supervision;Min guard Gait Distance (Feet): 35 Feet Assistive device: Quad cane Gait Pattern/deviations: Step-through pattern;Decreased step length - right;Decreased step length - left;Decreased stride length Gait velocity: decreased   General Gait Details: slightly labored cadence without loss of balance, limited secondary to c/o fatigue  Stairs            Wheelchair Mobility    Modified Rankin (Stroke Patients Only)       Balance Overall balance assessment: Needs assistance Sitting-balance support: Feet supported;No upper extremity supported Sitting balance-Leahy Scale: Good     Standing balance support: During functional activity;Single extremity supported Standing balance-Leahy Scale: Fair Standing balance comment: using Quad-cane                             Pertinent Vitals/Pain Pain Assessment: 0-10 Pain Score: 7  Pain Location: neuropathy pain right great toe Pain Descriptors / Indicators: Constant;Discomfort  Pain Intervention(s):  Limited activity within patient's tolerance;Monitored during session    [name] expects to be discharged to:: Private residence Living Arrangements: Children Available Help at Discharge: Family Type of Home: House Home Access: [place]: One [place]: Walker - standard;Cane - quad;Bedside commode;Wheelchair - manual      Prior Function Level of Independence: Independent with assistive device(s)         Comments: household and short distanced community ambulator with Quad-cane PRN due to chronic knee pain     Hand Dominance   Dominant Hand: Right    Extremity/Trunk Assessment   Upper Extremity Assessment Upper Extremity Assessment: Overall WFL for tasks assessed    Lower Extremity Assessment Lower Extremity Assessment: Generalized weakness    Cervical / Trunk Assessment Cervical / Trunk Assessment: Normal  Communication   Communication: No difficulties  Cognition Arousal/Alertness: Awake/alert Behavior During Therapy: WFL for tasks assessed/performed Overall Cognitive Status: Within Functional Limits for tasks assessed                                        General Comments      Exercises     Assessment/Plan    PT Assessment Patient needs continued PT services  PT Problem List Decreased strength;Decreased activity tolerance;Decreased balance;Decreased mobility       PT Treatment Interventions Therapeutic exercise;Gait training;Functional mobility training;Therapeutic activities;Patient/family education    PT Goals (Current goals can be found in the Care Plan section)  Acute Rehab PT Goals Patient Stated Goal: return home with her granddaughter to assist PT Goal Formulation: With patient Time For Goal Achievement: 11/12/18 Potential to Achieve Goals: Good    Frequency Min 3X/week   Barriers to discharge        Co-evaluation               AM-PAC PT "6 Clicks" Mobility   Outcome Measure Help needed turning from your back to your side while in a flat bed without using bedrails?: None Help needed moving from lying on your back to sitting on the side of a flat bed without using bedrails?: A Little(head of bed raised) Help needed moving to and from a bed to a chair (including a wheelchair)?: A Little Help needed standing up from a chair using your arms (e.g., wheelchair or bedside chair)?: A Little Help needed to walk in hospital room?: A Little Help needed climbing 3-5 steps with a railing? : A Lot 6 Click Score: 18    End of Session   Activity Tolerance: Patient tolerated treatment well;Patient limited by fatigue Patient left: in chair;with call bell/phone within reach Nurse Communication: Mobility status PT Visit Diagnosis: Unsteadiness on feet (R26.81);Other abnormalities of gait and mobility (R26.89);Muscle weakness (generalized) (M62.81)    Time: 0315-9458 PT Time Calculation (min) (ACUTE ONLY): 36 min   Charges:   PT Evaluation $PT Eval Moderate Complexity: 1 Mod PT Treatments $Therapeutic Activity: 23-37 mins        9:45 AM, 11/09/18 [name], MPT Physical Therapist with [facility] [phone] office [phone] mobile phone

## 2018-11-09 NOTE — Care Management Important Message (Signed)
Important Message  Patient Details  Name: [name] [name] MRN: [id number] Date of Birth: December 14, 1955   Medicare Important Message Given:  Yes    Tommy Medal 11/09/2018, 4:05 PM

## 2018-11-09 NOTE — Progress Notes (Signed)
PROGRESS NOTE    ETOY MCDONNELL  NAT:[id number] DOB: 01-Mar-1956 DOA: 11/06/2018 PCP: Redmond School, MD    Brief Narrative:  63 year old female who presented with elevated glucose and headache.She has a significant past medical history for anemia, osteoarthritis, asthma, chronic atrial fibrillation, nonalcoholic cirrhosis of the liver, COPD, hypertension, morbid obesity, type 2 diabetes mellitus and history of thyroid cancer. She reported sudden onset of generalized headache, predominantly in the frontal area, associated with nausea and decreased appetite. On her initial physical examination blood pressure was 107/40, heart rate 114, respiratory rate28, oxygen saturation 97%, she was ill looking appearing, dry mucous membranes, decreased breath sounds at bases bilaterally, heart S1-2 present, tachycardic, abdomen soft nontender, multiple healing excoriation in the abdomen and thighs. Mild erythema and foul-smelling disorder on lower abdomen and inguinal folds. Sodium 130, potassium 4.2, chloride 95, bicarb 24, glucose 194, BUN 14, creatinine 0.87, bicarb 31.2, hemoglobin 8.5, hematocrit 29.0, platelets 201,urinalysis negative for infection. Head CT negative for acute changes. Chest x-ray with cardiomegaly increased vascular congestion. EKG with 101 bpm, normal axis, sinus withnormal intervals, no ST segment or T wave changes.  Patient was admitted to the hospitalwith theworking diagnosis of sepsis due to lower extremity cellulitis   Assessment & Plan:   Principal Problem:   Sepsis due to undetermined organism [facility]) Active Problems:   Type 2 diabetes mellitus ([place])   Essential hypertension, benign   Cirrhosis, nonalcoholic ([facility])   Atrial fibrillation ([facility])   COPD (chronic obstructive pulmonary disease) ([facility])   GERD (gastroesophageal reflux disease)   Hypothyroidism   Sleep apnea  1. Sepsis due to cellulitis/ left lower extremity with streptococcal bacteremia. Wbc continue  trending down to 9,0, erythema is improved but not completely resolved. Will continue with antibiotic therapy with IV ceftriaxone for the next 24H. Possible transition to oral in am, cephalexin.   2. HTN.  Holding on lisinopril and furosemide to prevent hypotension.   3. T2Dm. Fasting glucose this am is 165,  continue insulin sliding scale for glucose cover and monitoring.  4. Paroxysmal atrial fibrillation. Rate controlled with oral diltiazem, and contineue anticoagulation with apixaban.   5. COPD. No acute exacerbation.   6. Hypothyroid. Continue with levothyroxine.   7. Morbid obesity. BMI is 42,7. Out of bed tid with meals.   8. Depression. Continue duloxetine. No confusion or agitation.   9. Hypokalemia. K corrected to 4,0, renal function continue to be preserved with serum cr at 0,67, serum bicarbonate at 22.  10. Anemia. Hgb today at 7,2 with no signs of bleeding, will check iron panel and will follow H&H in am.   DVT prophylaxis:apixaban Code Status:full Family Communication:no family at the bedside Disposition Plan/ discharge barriers:possible dc in am.    Body mass index is 42.74 kg/m. Malnutrition Type:      Malnutrition Characteristics:      Nutrition Interventions:     RN Pressure Injury Documentation:     Consultants:     Procedures:     Antimicrobials:   IV ceftriaxone     Subjective: Patient is out of bed to chair, her left lower extremity rash continue to improve, but not yet back to baseline. Positive local pruritus. No nausea or vomiting, no dyspnea or chest pain.   Objective: Vitals:   11/08/18 0542 11/08/18 1500 11/08/18 2123 11/09/18 0649  BP: 135/68 (!) 153/66 (!) 155/57 (!) 149/68  Pulse: 94 90 87 86  Resp: 18 17 16 20   Temp: 98.6 F (37 C) 99.5 F (37.5  C) 99.7 F (37.6 C) 98.2 F (36.8 C)  TempSrc: Oral Axillary Oral Oral  SpO2: 100% 99% 95% 98%  Weight:      Height:        Intake/Output Summary  (Last 24 hours) at 11/09/2018 1000 Last data filed at 11/09/2018 0900 Gross per 24 hour  Intake 1780 ml  Output 3101 ml  Net -1321 ml   Filed Weights   11/06/18 2214  Weight: 112.9 kg    Examination:   General: deconditioned  Neurology: Awake and alert, non focal  E ENT: no pallor, no icterus, oral mucosa moist Cardiovascular: No JVD. S1-S2 present, rhythmic, no gallops, rubs, or murmurs. No lower extremity edema. Pulmonary: positive breath sounds bilaterally, adequate air movement, no wheezing, rhonchi or rales. Gastrointestinal. Abdomen protuberant with no organomegaly, non tender, no rebound or guarding Skin. Left lower extremity rash is improving but not completely resolved, along with local temperature, no purulence.  Musculoskeletal: no joint deformities     Data Reviewed: I have personally reviewed following labs and imaging studies  CBC: Recent Labs  Lab 11/06/18 2257 11/07/18 0845 11/08/18 0616 11/09/18 0539  WBC 31.2* 20.4* 10.4 9.0  NEUTROABS 28.6* 18.9* 8.8* 7.3  HGB 8.5* 8.3* 7.6* 7.2*  HCT 29.0* 28.6* 26.8* 26.4*  MCV 75.3* 74.7* 75.5* 75.4*  PLT 201 146* 129* 620   Basic Metabolic Panel: Recent Labs  Lab 11/06/18 2257 11/07/18 0112 11/07/18 0845 11/08/18 0616 11/09/18 0539  NA 130* 128* 130* 133* 133*  K 4.2 3.8 3.3* 3.6 4.1  CL 95* 95* 99 103 104  CO2 24 21* 22 22 22   GLUCOSE 184* 166* 160* 153* 165*  BUN 14 13 11 13 13   CREATININE 0.87 0.77 0.76 0.74 0.67  CALCIUM 8.7* 8.1* 7.7* 8.1* 8.4*   GFR: Estimated Creatinine Clearance: 87.5 mL/min (by C-G formula based on SCr of 0.67 mg/dL). Liver Function Tests: Recent Labs  Lab 11/06/18 2257 11/07/18 0112  AST 23 23  ALT 16 16  ALKPHOS 166* 152*  BILITOT 0.8 0.8  PROT 7.4 6.9  ALBUMIN 3.2* 2.9*   No results for input(s): LIPASE, AMYLASE in the last 168 hours. No results for input(s): AMMONIA in the last 168 hours. Coagulation Profile: No results for input(s): INR, PROTIME in the last  168 hours. Cardiac Enzymes: No results for input(s): CKTOTAL, CKMB, CKMBINDEX, TROPONINI in the last 168 hours. BNP (last 3 results) No results for input(s): PROBNP in the last 8760 hours. HbA1C: No results for input(s): HGBA1C in the last 72 hours. CBG: Recent Labs  Lab 11/08/18 0742 11/08/18 1108 11/08/18 1630 11/08/18 2119 11/09/18 0719  GLUCAP 136* 158* 139* 114* 144*   Lipid Profile: No results for input(s): CHOL, HDL, LDLCALC, TRIG, CHOLHDL, LDLDIRECT in the last 72 hours. Thyroid Function Tests: Recent Labs    11/07/18 0845  TSH 1.393   Anemia Panel: No results for input(s): VITAMINB12, FOLATE, FERRITIN, TIBC, IRON, RETICCTPCT in the last 72 hours.    Radiology Studies: I have reviewed all of the imaging during this hospital visit personally     Scheduled Meds: . apixaban  5 mg Oral BID  . diltiazem  360 mg Oral Daily  . DULoxetine  60 mg Oral Daily  . fesoterodine  4 mg Oral Daily  . gabapentin  300 mg Oral Daily  . insulin aspart  0-20 Units Subcutaneous TID WC  . levothyroxine  275 mcg Oral QAC breakfast  . nystatin   Topical BID  . pantoprazole  40 mg Oral QAC breakfast  . pravastatin  20 mg Oral Daily  . traZODone  100-200 mg Oral QHS   Continuous Infusions: . cefTRIAXone (ROCEPHIN)  IV Stopped (11/08/18 1507)     LOS: 2 days         Gerome Apley, MD

## 2018-11-09 NOTE — Plan of Care (Signed)
  Problem: Acute Rehab PT Goals(only PT should resolve) Goal: Patient Will Transfer Sit To/From Stand Outcome: Progressing Flowsheets (Taken 11/09/2018 0947) Patient will transfer sit to/from stand: with modified independence Goal: Pt Will Transfer Bed To Chair/Chair To Bed Outcome: Progressing Flowsheets (Taken 11/09/2018 0947) Pt will Transfer Bed to Chair/Chair to Bed: with supervision Goal: Pt Will Ambulate Outcome: Progressing Flowsheets (Taken 11/09/2018 0947) Pt will Ambulate: 50 feet; with supervision; with cane; with rolling walker Goal: Pt Will Go Supine/Side To Sit Outcome: Progressing Flowsheets (Taken 11/09/2018 0947) Pt will go Supine/Side to Sit: with modified independence   9:48 AM, 11/09/18 [name], MPT Physical Therapist with [facility] [phone] office [phone] mobile phone

## 2018-11-09 NOTE — TOC Transition Note (Signed)
Transition of Care [facility]) - CM/SW Discharge Note   Patient Details  Name: [name] [name] MRN: [id number] Date of Birth: [birth year]  [facility]) CM/SW Contact:  Jaquay Posthumus, Chauncey Reading, RN Phone Number: 11/09/2018, 11:52 AM   Clinical Narrative:   From home, independent. Has quad cane if needed. Recommended for OP PT. Agreeable to referral, would like [facility] OP rehab, but is concerned about cost.  Discussed making referral and finding out cost and if she has copays she can decline.   Expected Discharge Plan: OP Rehab Barriers to Discharge: No Barriers Identified   Patient Goals and CMS Choice Patient states their goals for this hospitalization and ongoing recovery are:: get home and feel better CMS Medicare.gov Compare Post Acute Care list provided to:: Patient Choice offered to / list presented to : Patient  Expected Discharge Plan and Services Expected Discharge Plan: OP Rehab   Discharge Planning Services: CM Consult   Living arrangements for the past 2 months: Single Family Home                    Prior Living Arrangements/Services Living arrangements for the past 2 months: Single Family Home   Patient language and need for interpreter reviewed:: Yes            Current home services: DME(quad cane) Criminal Activity/Legal Involvement Pertinent to Current Situation/Hospitalization: No - Comment as needed  Activities of Daily Living Home Assistive Devices/Equipment: CBG Meter, Eyeglasses, Cane (specify quad or straight), Walker (specify type) ADL Screening (condition at time of admission) Patient's cognitive ability adequate to safely complete daily activities?: Yes Is the patient deaf or have difficulty hearing?: No Does the patient have difficulty seeing, even when wearing glasses/contacts?: No Does the patient have difficulty concentrating, remembering, or making decisions?: No Patient able to express need for assistance with ADLs?: Yes Does the patient  have difficulty dressing or bathing?: No Independently performs ADLs?: Yes (appropriate for developmental age) Does the patient have difficulty walking or climbing stairs?: No Weakness of Legs: None Weakness of Arms/Hands: None  Permission Sought/Granted   Permission granted to share information with : Yes, Verbal Permission Granted     Permission granted to share info w AGENCY: [facility] re[facility]           Affect (typically observed): Accepting Orientation: : Oriented to Self, Oriented to Place, Oriented to  Time      Admission diagnosis:  High sugar, fever  Patient Active Problem List   Diagnosis Date Noted  . Sepsis due to undetermined organism ([place]) 11/07/2018  . Atrial fibrillation ([place]) 11/07/2018  . COPD (chronic obstructive pulmonary disease) ([place]) 11/07/2018  . GERD (gastroesophageal reflux disease) 11/07/2018  . Hypothyroidism 11/07/2018  . Sleep apnea 11/07/2018  . Hepatic cirrhosis ([place]) 09/01/2014  . Cirrhosis, nonalcoholic ([place]) 62/56/3893  . Incisional hernia 06/09/2014  . S/P complete repair of rotator cuff 07/24/2012  . Precordial pain 09/30/2010  . Essential hypertension, benign 09/30/2010  . Type 2 diabetes mellitus ([place]) 09/05/2008  . OBESITY-MORBID (>100') 09/05/2008  . ESOPHAGEAL REFLUX 09/05/2008  . EDEMA 09/05/2008   PCP:  Redmond School, MD Pharmacy:   [facility], [address] [place] [address] Phone: [phone] Fax: [phone]    No flowsheet data found.   Final next level of care: OP Rehab Barriers to Discharge: No Barriers Identified   Patient Goals and CMS Choice Patient states their goals for this hospitalization and ongoing  recovery are:: get home and feel better CMS Medicare.gov Compare Post Acute Care list provided to:: Patient Choice offered to / list presented to : Patient  Discharge Placement                       Discharge Plan and Services   Discharge Planning  Services: CM Consult              Readmission Risk Interventions No flowsheet data found.

## 2018-11-10 ENCOUNTER — Inpatient Hospital Stay (HOSPITAL_COMMUNITY): Payer: PPO

## 2018-11-10 DIAGNOSIS — R7881 Bacteremia: Secondary | ICD-10-CM

## 2018-11-10 LAB — HEMOGLOBIN AND HEMATOCRIT, BLOOD
HCT: 27.7 % — ABNORMAL LOW (ref 36.0–46.0)
Hemoglobin: 8.1 g/dL — ABNORMAL LOW (ref 12.0–15.0)

## 2018-11-10 LAB — GLUCOSE, CAPILLARY
Glucose-Capillary: 164 mg/dL — ABNORMAL HIGH (ref 70–99)
Glucose-Capillary: 171 mg/dL — ABNORMAL HIGH (ref 70–99)
Glucose-Capillary: 138 mg/dL — ABNORMAL HIGH (ref 70–99)
Glucose-Capillary: 157 mg/dL — ABNORMAL HIGH (ref 70–99)

## 2018-11-10 LAB — FERRITIN: Ferritin: 23 ng/mL (ref 11–307)

## 2018-11-10 LAB — ECHOCARDIOGRAM COMPLETE
Height: 64 in
Weight: 3984 [oz_av]

## 2018-11-10 LAB — IRON AND TIBC
Iron: 15 ug/dL — ABNORMAL LOW (ref 28–170)
Saturation Ratios: 5 % — ABNORMAL LOW (ref 10.4–31.8)
TIBC: 294 ug/dL (ref 250–450)
UIBC: 279 ug/dL

## 2018-11-10 LAB — TRANSFERRIN: Transferrin: 210 mg/dL (ref 192–382)

## 2018-11-10 MED ORDER — SODIUM CHLORIDE 0.9 % IV SOLN
510.0000 mg | Freq: Once | INTRAVENOUS | Status: AC
  Administered 2018-11-10: 510 mg via INTRAVENOUS
  Filled 2018-11-10: qty 17

## 2018-11-10 MED ORDER — SODIUM CHLORIDE 0.9 % IV SOLN
2.0000 g | INTRAVENOUS | Status: DC
  Administered 2018-11-10: 2 g via INTRAVENOUS
  Filled 2018-11-10 (×2): qty 20

## 2018-11-10 MED ORDER — CEPHALEXIN 500 MG PO CAPS: 500.0000 mg | ORAL_CAPSULE | Freq: Four times a day (QID) | ORAL | Status: DC

## 2018-11-10 NOTE — Progress Notes (Signed)
*  PRELIMINARY RESULTS* Echocardiogram 2D Echocardiogram has been performed.  Leavy Cella 11/10/2018, 10:42 AM

## 2018-11-10 NOTE — Progress Notes (Signed)
PROGRESS NOTE    [name] [name]  YPP:[id number] DOB: [birth year] DOA: 11/06/2018 PCP: Redmond School, MD    Brief Narrative:  63 year old female who presented with elevated glucose and headache.She has a significant past medical history for anemia, osteoarthritis, asthma, chronic atrial fibrillation, nonalcoholic cirrhosis of the liver, COPD, hypertension, morbid obesity, type 2 diabetes mellitus and history of thyroid cancer. She reported sudden onset of generalized headache, predominantly in the frontal area, associated with nausea and decreased appetite. On her initial physical examination blood pressure was 107/40, heart rate 114, respiratory rate28, oxygen saturation 97%, she was ill looking appearing, dry mucous membranes, decreased breath sounds at bases bilaterally, heart S1-2 present, tachycardic, abdomen soft nontender, multiple healing excoriation in the abdomen and thighs. Mild erythema and foul-smelling disorder on lower abdomen and inguinal folds. Sodium 130, potassium 4.2, chloride 95, bicarb 24, glucose 194, BUN 14, creatinine 0.87, bicarb 31.2, hemoglobin 8.5, hematocrit 29.0, platelets 201,urinalysis negative for infection. Head CT negative for acute changes. Chest x-ray with cardiomegaly increased vascular congestion. EKG with 101 bpm, normal axis, sinus withnormal intervals, no ST segment or T wave changes.  Patient was admitted to the hospitalwith theworking diagnosis of sepsis due to lower extremity cellulitis   Assessment & Plan:   Principal Problem:   Sepsis due to undetermined organism [facility]) Active Problems:   Type 2 diabetes mellitus ([place])   Essential hypertension, benign   Cirrhosis, nonalcoholic ([facility])   Atrial fibrillation ([facility])   COPD (chronic obstructive pulmonary disease) (HCC)   GERD (gastroesophageal reflux disease)   Hypothyroidism   Sleep apnea   1. Sepsis due to cellulitis/leftlower extremity with streptococcal bacteremia.Blood  culture x1, positive for streptococcal viridans, second bottle with no growth. Patient has responded well to antibiotic therapy and has remained afebrile with improvement of leukocytosis, suspected source of infection left lower extremity cellulitis. 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   2. HTN. Continue to hold onlisinopril and furosemide, for now. Blood pressure today 161/65.   3. T2Dm.Patient has been tolerating po well, will continue insulin sliding scale for glucose cover and monitoring.  4. Paroxysmal atrial fibrillation. Continue with oraldiltiazem for rate control and will continue anticoagulation with apixaban.   5. COPD.Nocurrent signs of acute exacerbation.   6. Hypothyroid.Onlevothyroxine.   7. Morbid obesity. BMI is 42,7.Out of bed and physical therapy evaluation.   8. Depression. On duloxetine. No current confusion or agitation.   9. Hypokalemia. Follow on renal panel in am, patient is tolerating po well.  10. Anemia with iron deficiency. H&H today up to 8,1. Serum Iron at 15 with TIBC 294, transferrin saturation is 5, with ferritin of 23. Will add IV iron today. Follow cell count in am.   DVT prophylaxis:apixaban Code Status:full Family Communication:no family at the bedside Disposition Plan/ discharge barriers:possible dc in am.   Body mass index is 42.74 kg/m. Malnutrition Type:      Malnutrition Characteristics:      Nutrition Interventions:     RN Pressure Injury Documentation:     Consultants:     Procedures:     Antimicrobials:   IV ceftriaxone    Subjective: Patient is feeling better, but not yet back to baseline, continue to have erythema on her left leg, no nausea or vomiting, no chest pain or dyspnea.   Objective: Vitals:   11/09/18 1512 11/09/18 2225 11/10/18 0623 11/10/18  0646  BP: 133/72 (!) 145/58 (!) 113/35 [name])  161/65  Pulse: 89 80 78 77  Resp: 20 16 17    Temp: 98.2 F (36.8 C) 97.9 F (36.6 C) 98.2 F (36.8 C)   TempSrc: Oral Oral Oral   SpO2: 100% 100% 96%   Weight:      Height:        Intake/Output Summary (Last 24 hours) at 11/10/2018 0923 Last data filed at 11/10/2018 0700 Gross per 24 hour  Intake 960 ml  Output 900 ml  Net 60 ml   Filed Weights   11/06/18 2214  Weight: 112.9 kg    Examination:   General: deconditioned  Neurology: Awake and alert, non focal  E ENT: mild pallor, no icterus, oral mucosa moist Cardiovascular: No JVD. S1-S2 present, rhythmic, no gallops, rubs, or murmurs. No lower extremity edema. Pulmonary: positive breath sounds bilaterally, adequate air movement, no wheezing, rhonchi or rales. Gastrointestinal. Abdomen protuberant with no organomegaly, non tender, no rebound or guarding Skin. Left lower extremity erythema, ill defined borders, no purulence.  Musculoskeletal: no joint deformities     Data Reviewed: I have personally reviewed following labs and imaging studies  CBC: Recent Labs  Lab 11/06/18 2257 11/07/18 0845 11/08/18 0616 11/09/18 0539 11/10/18 0629  WBC 31.2* 20.4* 10.4 9.0  --   NEUTROABS 28.6* 18.9* 8.8* 7.3  --   HGB 8.5* 8.3* 7.6* 7.2* 8.1*  HCT 29.0* 28.6* 26.8* 26.4* 27.7*  MCV 75.3* 74.7* 75.5* 75.4*  --   PLT 201 146* 129* 156  --    Basic Metabolic Panel: Recent Labs  Lab 11/06/18 2257 11/07/18 0112 11/07/18 0845 11/08/18 0616 11/09/18 0539  NA 130* 128* 130* 133* 133*  K 4.2 3.8 3.3* 3.6 4.1  CL 95* 95* 99 103 104  CO2 24 21* 22 22 22   GLUCOSE 184* 166* 160* 153* 165*  BUN 14 13 11 13 13   CREATININE 0.87 0.77 0.76 0.74 0.67  CALCIUM 8.7* 8.1* 7.7* 8.1* 8.4*   GFR: Estimated Creatinine Clearance: 87.5 mL/min (by C-G formula based on SCr of 0.67 mg/dL). Liver Function Tests: Recent Labs  Lab 11/06/18 2257 11/07/18 0112  AST 23 23  ALT 16 16  ALKPHOS 166*  152*  BILITOT 0.8 0.8  PROT 7.4 6.9  ALBUMIN 3.2* 2.9*   No results for input(s): LIPASE, AMYLASE in the last 168 hours. No results for input(s): AMMONIA in the last 168 hours. Coagulation Profile: No results for input(s): INR, PROTIME in the last 168 hours. Cardiac Enzymes: No results for input(s): CKTOTAL, CKMB, CKMBINDEX, TROPONINI in the last 168 hours. BNP (last 3 results) No results for input(s): PROBNP in the last 8760 hours. HbA1C: No results for input(s): HGBA1C in the last 72 hours. CBG: Recent Labs  Lab 11/09/18 0719 11/09/18 1150 11/09/18 1622 11/09/18 2230 11/10/18 0805  GLUCAP 144* 163* 133* 150* 164*   Lipid Profile: No results for input(s): CHOL, HDL, LDLCALC, TRIG, CHOLHDL, LDLDIRECT in the last 72 hours. Thyroid Function Tests: No results for input(s): TSH, T4TOTAL, FREET4, T3FREE, THYROIDAB in the last 72 hours. Anemia Panel: No results for input(s): VITAMINB12, FOLATE, FERRITIN, TIBC, IRON, RETICCTPCT in the last 72 hours.    Radiology Studies: I have reviewed all of the imaging during this hospital visit personally     Scheduled Meds: . apixaban  5 mg Oral BID  . diltiazem  360 mg Oral Daily  . DULoxetine  60 mg Oral Daily  . fesoterodine  4 mg Oral Daily  . gabapentin  300 mg Oral Daily  .  insulin aspart  0-20 Units Subcutaneous TID WC  . levothyroxine  275 mcg Oral QAC breakfast  . nystatin   Topical BID  . pantoprazole  40 mg Oral QAC breakfast  . pravastatin  20 mg Oral Daily  . traZODone  100-200 mg Oral QHS   Continuous Infusions: . cefTRIAXone (ROCEPHIN)  IV       LOS: 3 days        Toleen Lachapelle Gerome Apley, MD

## 2018-11-11 DIAGNOSIS — L03116 Cellulitis of left lower limb: Secondary | ICD-10-CM

## 2018-11-11 DIAGNOSIS — L0390 Cellulitis, unspecified: Secondary | ICD-10-CM

## 2018-11-11 LAB — CBC WITH DIFFERENTIAL/PLATELET
Abs Immature Granulocytes: 0.12 10*3/uL — ABNORMAL HIGH (ref 0.00–0.07)
Basophils Absolute: 0 10*3/uL (ref 0.0–0.1)
Basophils Relative: 1 %
Eosinophils Absolute: 0.2 10*3/uL (ref 0.0–0.5)
Eosinophils Relative: 4 %
HCT: 28 % — ABNORMAL LOW (ref 36.0–46.0)
Hemoglobin: 7.7 g/dL — ABNORMAL LOW (ref 12.0–15.0)
Immature Granulocytes: 2 %
Lymphocytes Relative: 21 %
Lymphs Abs: 1.3 10*3/uL (ref 0.7–4.0)
MCH: 21 pg — ABNORMAL LOW (ref 26.0–34.0)
MCHC: 27.5 g/dL — ABNORMAL LOW (ref 30.0–36.0)
MCV: 76.3 fL — ABNORMAL LOW (ref 80.0–100.0)
Monocytes Absolute: 0.5 10*3/uL (ref 0.1–1.0)
Monocytes Relative: 9 %
Neutro Abs: 3.7 10*3/uL (ref 1.7–7.7)
Neutrophils Relative %: 63 %
Platelets: 189 10*3/uL (ref 150–400)
RBC: 3.67 MIL/uL — ABNORMAL LOW (ref 3.87–5.11)
RDW: 16.3 % — ABNORMAL HIGH (ref 11.5–15.5)
WBC: 5.9 10*3/uL (ref 4.0–10.5)
nRBC: 0 % (ref 0.0–0.2)

## 2018-11-11 LAB — BASIC METABOLIC PANEL
Anion gap: 9 (ref 5–15)
BUN: 12 mg/dL (ref 8–23)
CO2: 24 mmol/L (ref 22–32)
Calcium: 8.9 mg/dL (ref 8.9–10.3)
Chloride: 103 mmol/L (ref 98–111)
Creatinine, Ser: 0.64 mg/dL (ref 0.44–1.00)
GFR calc Af Amer: >60 mL/min (ref >60)
GFR calc non Af Amer: >60 mL/min (ref >60)
Glucose, Bld: 154 mg/dL — ABNORMAL HIGH (ref 70–99)
Potassium: 4.3 mmol/L (ref 3.5–5.1)
Sodium: 136 mmol/L (ref 135–145)

## 2018-11-11 LAB — GLUCOSE, CAPILLARY: Glucose-Capillary: 165 mg/dL — ABNORMAL HIGH (ref 70–99)

## 2018-11-11 MED ORDER — CEPHALEXIN 500 MG PO CAPS: 500.0000 mg | ORAL_CAPSULE | Freq: Four times a day (QID) | ORAL | 0 refills | Status: AC

## 2018-11-11 MED ORDER — CEPHALEXIN 500 MG PO CAPS
500.0000 mg | ORAL_CAPSULE | Freq: Four times a day (QID) | ORAL | Status: DC
  Administered 2018-11-11: 500 mg via ORAL
  Filled 2018-11-11: qty 1

## 2018-11-11 NOTE — Progress Notes (Signed)
Removed IV-clean, dry, intact. Reviewed d/c paperwork with patient. Reviewed new medication and changes. Answered all questions. Wheeled stable patient and belongings to short stay entrance where she was picked up by granddaughter to d/c home.

## 2018-11-11 NOTE — Discharge Summary (Signed)
Physician Discharge Summary  [name] [name] DDU:[id number] DOB: 1956-04-28 DOA: 11/06/2018  PCP: Redmond School, MD  Admit date: 11/06/2018 Discharge date: 11/11/2018  Admitted From: Home  Disposition:  Home   Recommendations for Outpatient Follow-up and new medication changes:  1. Follow up with Dr. Gerarda Fraction in 7 days.  2. Antibiotic therapy with cephalexin for 5 days.  3. Patient has been on ace inh and arb combined at home, will discontinue lisinopril for now to prevent side effects, her discharge blood pressure is 159/59 mmHg, off antihypertensive agents.    Home Health: no   Equipment/Devices: no    Discharge Condition: stable  CODE STATUS: full  Diet recommendation: heart healthy and diabetic prudent   Brief/Interim Summary: 63 year old female who presented with elevated glucose and headache.She has a significant past medical history for anemia, osteoarthritis, asthma, chronic atrial fibrillation, nonalcoholic cirrhosis of the liver, COPD, hypertension, morbid obesity, type 2 diabetes mellitus and history of thyroid cancer. She reported sudden onset of generalized headache, predominantly in the frontal area, associated with nausea and decreased appetite. On her initial physical examination blood pressure was 107/40, heart rate 114, respiratory rate28, oxygen saturation 97%, she was ill looking appearing, dry mucous membranes, decreased breath sounds at bases bilaterally, heart S1-2 present, tachycardic, abdomen soft nontender, multiple healing excoriation in the abdomen and thighs. Mild erythema and foul-smelling odor on lower abdomen and inguinal folds. Sodium 130, potassium 4.2, chloride 95, bicarb 24, glucose 194, BUN 14, creatinine 0.87, bicarb 31.2, white cell count 31.5, hemoglobin 8.5, hematocrit 29.0, platelets 201,urinalysis negative for infection. Head CT negative for acute changes. Chest x-ray with cardiomegaly increased vascular congestion. EKG with 101 bpm, normal  axis, sinus withnormal intervals, no ST segment or T wave changes.  Patient was admitted to the hospitalwith theworking diagnosis of sepsis due to left lower extremity cellulitis  1.  Sepsis due to left lower extremity cellulitis, present on admission.  Patient was admitted to the medical ward, she was placed on a remote telemetry monitor, received broad-spectrum antibiotic therapy with IV vancomycin and IV cefepime.  From the first set of blood cultures 1 bottle grew positive Streptococcus viridans, further work-up with transthoracic echocardiography was negative for vegetation, second bottle and repeat cultures have been no growth.  Possible contaminant culture.  Through the course of her hospitalization became evident that the source of infection was a left lower extremity cellulitis.  Antibiotic therapy was de-escalated to IV ceftriaxone that she tolerated well.  She has been afebrile and her discharge white count is 5.9.  Patient will complete antibiotic therapy with oral cephalexin for the next 5 days.  She has been advised to avoid scratching his legs to prevent further skin injury.  2.  Severe iron deficiency anemia.  After volume resuscitation her hemoglobin reached a nadir of 7,2, further work-up with iron studies showed serum iron of 15, TIBC 294, transferrin saturation 5, ferritin 23 and transferrin of 210.  Patient received 1 dose of IV iron (ferumoxytol), with good toleration, follow up iron levels as outpatient.   3.  Hypertension.  Her antihypertensive agents were held during her hospitalization, at discharge she will resume losartan and furosemide.  4.  Type 2 diabetes mellitus.  Glucose remained well controlled, her fasting glucose at discharge was 154 mg/dl. Patient was placed on insulin sliding scale for glucose coverage and monitoring. At discharge she will resume lantus and metformin per her usual home regimen.   5.  Paroxysmal atrial fibrillation.  Patient was continued on  diltiazem for rate control, anticoagulation with apixaban.  6.  COPD.  No clinical signs of acute exacerbation.  7.  Hypothyroid.  Continue levothyroxine.  8.  Hypokalemia.  Potassium was corrected, kidney function remained stable.  9.  Depression.  Continue duloxetine and trazodone.   10.  Morbid obesity with dyslipidemia.  Her calculated BMI 42.7.  Will need close follow-up as an outpatient. Continue pravastatin.   Discharge Diagnoses:  Principal Problem:   Sepsis due to undetermined organism [facility]) Active Problems:   Type 2 diabetes mellitus ([place])   Essential hypertension, benign   Atrial fibrillation (HCC)   COPD (chronic obstructive pulmonary disease) (HCC)   GERD (gastroesophageal reflux disease)   Hypothyroidism   Sleep apnea   Cellulitis left lower extremity.     Discharge Instructions  Discharge Instructions    Ambulatory referral to Physical Therapy   Complete by:  As directed      Allergies as of 11/11/2018      Reactions   Doxycycline Itching   Adhesive [tape] Other (See Comments)   Blisters skin   Biaxin [clarithromycin] Other (See Comments)   Really bad yeast infection   Percocet [oxycodone-acetaminophen] Itching   Xarelto [rivaroxaban] Itching   Clarithromycin Rash   Yeast Infection   Clindamycin/lincomycin Rash   Yeast Infection    Neosporin [neomycin-polymyxin B Gu] Rash   Eyes Drops only   Penicillins Rash   Has patient had a PCN reaction causing immediate rash, facial/tongue/throat swelling, SOB or lightheadedness with hypotension: Yes Has patient had a PCN reaction causing severe rash involving mucus membranes or skin necrosis: No Has patient had a PCN reaction that required hospitalization: No Has patient had a PCN reaction occurring within the last 10 years: No If all of the above answers are "NO", then may proceed with Cephalosporin use.      Medication List    STOP taking these medications   HYDROmorphone 4 MG tablet Commonly known  as:  DILAUDID   lisinopril 20 MG tablet Commonly known as:  ZESTRIL     TAKE these medications   apixaban 5 MG Tabs tablet Commonly known as:  Eliquis Take 1 tablet (5 mg total) by mouth 2 (two) times daily.   cephALEXin 500 MG capsule Commonly known as:  KEFLEX Take 1 capsule (500 mg total) by mouth every 6 (six) hours for 5 days.   colesevelam 625 MG tablet Commonly known as:  WELCHOL Take 1,875 mg by mouth 2 (two) times daily with a meal. 3 tabs am 3 tabs pm   diltiazem 360 MG 24 hr capsule Commonly known as:  Cartia XT Take 1 capsule (360 mg total) by mouth daily.   DULoxetine 60 MG capsule Commonly known as:  CYMBALTA Take 60 mg by mouth daily.   DULoxetine 30 MG capsule Commonly known as:  CYMBALTA Take 30 mg by mouth daily.   econazole nitrate 1 % cream Apply topically daily. What changed:    how much to take  when to take this  reasons to take this   furosemide 20 MG tablet Commonly known as:  LASIX Take 20 mg by mouth daily as needed for fluid.   gabapentin 300 MG capsule Commonly known as:  NEURONTIN Take 300 mg by mouth daily.   HYDROcodone-acetaminophen 10-325 MG tablet Commonly known as:  NORCO Take 1 tablet by mouth every hour as needed for moderate pain.   hydrOXYzine 25 MG tablet Commonly known as:  ATARAX/VISTARIL Take 25 mg by mouth 4 (four)  times daily as needed.   Lantus SoloStar 100 UNIT/ML Solostar Pen Generic drug:  Insulin Glargine Inject 44 Units into the skin.   losartan 100 MG tablet Commonly known as:  COZAAR Take 100 mg by mouth daily.   meloxicam 15 MG tablet Commonly known as:  MOBIC Take 15 mg by mouth daily.   metFORMIN 1000 MG tablet Commonly known as:  GLUCOPHAGE Take 1,000 mg by mouth 2 (two) times daily with a meal.   nystatin powder Generic drug:  nystatin Apply 100,000 g topically daily as needed (for rash-irritation). Rash   nystatin cream Commonly known as:  MYCOSTATIN Apply 1 application topically  3 (three) times daily.   pantoprazole 40 MG tablet Commonly known as:  PROTONIX Take 1 tablet (40 mg total) by mouth daily before breakfast.   potassium chloride SA 20 MEQ tablet Commonly known as:  K-DUR Take 20 mEq by mouth 2 (two) times daily.   pravastatin 20 MG tablet Commonly known as:  PRAVACHOL Take 20 mg by mouth daily.   Proventil HFA 108 (90 Base) MCG/ACT inhaler Generic drug:  albuterol Inhale 2 puffs into the lungs every 6 (six) hours as needed for wheezing or shortness of breath. Shortness of breath   levothyroxine 75 MCG tablet Commonly known as:  SYNTHROID Take 50-75 mcg by mouth daily before breakfast. DOSE NEEDS TO BE VERIFIED!   Synthroid 200 MCG tablet Generic drug:  levothyroxine Take 1 tablet by mouth daily. DOSE NEEDS TO BE VERIFIED   tolterodine 4 MG 24 hr capsule Commonly known as:  DETROL LA Take 4 mg by mouth 2 (two) times a day.   traZODone 100 MG tablet Commonly known as:  DESYREL Take 100-200 mg by mouth at bedtime.      Follow-up Information    Redmond School, MD Follow up on 11/14/2018.   Specialty:  Internal Medicine Why:  Please follow up with Rowan Blase, PA-C on Wednesday, May 27th at  11:30am. Contact information: [address] [place] [address] [phone]          Allergies  Allergen Reactions  . Doxycycline Itching  . Adhesive [Tape] Other (See Comments)    Blisters skin  . Biaxin [Clarithromycin] Other (See Comments)    Really bad yeast infection  . Percocet [Oxycodone-Acetaminophen] Itching  . Xarelto [Rivaroxaban] Itching  . Clarithromycin Rash    Yeast Infection  . Clindamycin/Lincomycin Rash    Yeast Infection   . Neosporin [Neomycin-Polymyxin B Gu] Rash    Eyes Drops only  . Penicillins Rash    Has patient had a PCN reaction causing immediate rash, facial/tongue/throat swelling, SOB or lightheadedness with hypotension: Yes Has patient had a PCN reaction causing severe rash involving mucus  membranes or skin necrosis: No Has patient had a PCN reaction that required hospitalization: No Has patient had a PCN reaction occurring within the last 10 years: No If all of the above answers are "NO", then may proceed with Cephalosporin use.     Consultations:     Procedures/Studies: Dg Chest 2 View  Result Date: 11/06/2018 CLINICAL DATA:  Altered mental status EXAM: CHEST - 2 VIEW COMPARISON:  09/04/2017 FINDINGS: Cardiomegaly, increased compared to prior. Vascular congestion. No pleural effusion. Aortic atherosclerosis. No pneumothorax. IMPRESSION: Cardiomegaly, increased compared to prior with mild central vascular congestion. Electronically Signed   By: Donavan Foil M.D.   On: 11/06/2018 23:31   Ct Head Wo Contrast  Result Date: 11/06/2018 CLINICAL DATA:  High blood sugar headache EXAM: CT  HEAD WITHOUT CONTRAST TECHNIQUE: Contiguous axial images were obtained from the base of the skull through the vertex without intravenous contrast. COMPARISON:  None. FINDINGS: Brain: Mild motion degradation. No acute territorial infarction, hemorrhage, or intracranial mass. The ventricles are nonenlarged Vascular: No hyperdense vessels.  No unexpected calcification Skull: Normal. Negative for fracture or focal lesion. Sinuses/Orbits: No acute finding. Other: None IMPRESSION: Mild motion degraded study. Allowing for motion, negative non contrasted CT appearance of the brain Electronically Signed   By: Donavan Foil M.D.   On: 11/06/2018 23:28      Procedures:   Subjective: Patient is feeling better, left lower extremity rash continue to improve, no nausea or vomiting, no chest pain or dyspnea.   Discharge Exam: Vitals:   11/10/18 2202 11/11/18 0506  BP: (!) 169/60 (!) 156/59  Pulse: 85 81  Resp: 18 18  Temp: 98.3 F (36.8 C) 98.5 F (36.9 C)  SpO2: 90% 98%   Vitals:   11/10/18 0646 11/10/18 1300 11/10/18 2202 11/11/18 0506  BP: (!) 161/65 131/61 (!) 169/60 (!) 156/59  Pulse: 77 85  85 81  Resp:   18 18  Temp:  98.1 F (36.7 C) 98.3 F (36.8 C) 98.5 F (36.9 C)  TempSrc:  Oral Oral Oral  SpO2:  98% 90% 98%  Weight:      Height:        General: Not in pain or dyspnea.  Neurology: Awake and alert, non focal  E ENT: mild pallor, no icterus, oral mucosa moist Cardiovascular: No JVD. S1-S2 present, rhythmic, no gallops, rubs, or murmurs. No lower extremity edema. Pulmonary: positive breath sounds bilaterally, adequate air movement, no wheezing, rhonchi or rales. Gastrointestinal. Abdomen protuberant with no organomegaly, non tender, no rebound or guarding Skin.improving left lower extremity rash. No purulence.  Musculoskeletal: no joint deformities   The results of significant diagnostics from this hospitalization (including imaging, microbiology, ancillary and laboratory) are listed below for reference.     Microbiology: Recent Results (from the past 240 hour(s))  SARS Coronavirus 2 (CEPHEID - Performed in [place] hospital lab), Hosp Order     Status: None   Collection Time: 11/07/18 12:32 AM  Result Value Ref Range Status   SARS Coronavirus 2 NEGATIVE NEGATIVE Final    Comment: (NOTE) If result is NEGATIVE SARS-CoV-2 target nucleic acids are NOT DETECTED. The SARS-CoV-2 RNA is generally detectable in upper and lower  respiratory specimens during the acute phase of infection. The lowest  concentration of SARS-CoV-2 viral copies this assay can detect is 250  copies / mL. A negative result does not preclude SARS-CoV-2 infection  and should not be used as the sole basis for treatment or other  patient management decisions.  A negative result may occur with  improper specimen collection / handling, submission of specimen other  than nasopharyngeal swab, presence of viral mutation(s) within the  areas targeted by this assay, and inadequate number of viral copies  (<250 copies / mL). A negative result must be combined with clinical  observations, patient  history, and epidemiological information. If result is POSITIVE SARS-CoV-2 target nucleic acids are DETECTED. The SARS-CoV-2 RNA is generally detectable in upper and lower  respiratory specimens dur ing the acute phase of infection.  Positive  results are indicative of active infection with SARS-CoV-2.  Clinical  correlation with patient history and other diagnostic information is  necessary to determine patient infection status.  Positive results do  not rule out bacterial infection or co-infection with other  viruses. If result is PRESUMPTIVE POSTIVE SARS-CoV-2 nucleic acids MAY BE PRESENT.   A presumptive positive result was obtained on the submitted specimen  and confirmed on repeat testing.  While 2019 novel coronavirus  (SARS-CoV-2) nucleic acids may be present in the submitted sample  additional confirmatory testing may be necessary for epidemiological  and / or clinical management purposes  to differentiate between  SARS-CoV-2 and other Sarbecovirus currently known to infect humans.  If clinically indicated additional testing with an alternate test  methodology [phone]) is advised. The SARS-CoV-2 RNA is generally  detectable in upper and lower respiratory sp ecimens during the acute  phase of infection. The expected result is Negative. Fact Sheet for Patients:  StrictlyIdeas.no Fact Sheet for Healthcare Providers: BankingDealers.co.za This test is not yet approved or cleared by the [place] FDA and has been authorized for detection and/or diagnosis of SARS-CoV-2 by FDA under an Emergency Use Authorization (EUA).  This EUA will remain in effect (meaning this test can be used) for the duration of the COVID-19 declaration under Section 564(b)(1) of the Act, 21 U.S.C. section 360bbb-3(b)(1), unless the authorization is terminated or revoked sooner. Performed at [facility], [address] [place], [place] [address]   Blood  Culture (routine x 2)     Status: Abnormal   Collection Time: 11/07/18  1:04 AM  Result Value Ref Range Status   Specimen Description   Final    BLOOD RIGHT ANTECUBITAL Performed at [facility], [address] [place], [place] [address]    Special Requests   Final    BOTTLES DRAWN AEROBIC AND ANAEROBIC Blood Culture adequate volume Performed at [facility], [address], [place], [place] [address]    Culture  Setup Time   Final    GRAM POSITIVE COCCI ANAEROBE BOTTLE Gram Stain Report Called to,Read Back By and Verified With: Narda Bonds AT 2458 BY HFLYNT 11/07/18 GRAM POSITIVE COCCI AEROBIC BOTTLE Organism ID to follow CRITICAL RESULT CALLED TO, READ BACK BY AND VERIFIED WITH: Nicholaus Bloom RN 11/07/18 1841 JDW    Culture (A)  Final    VIRIDANS STREPTOCOCCUS THE SIGNIFICANCE OF ISOLATING THIS ORGANISM FROM A SINGLE SET OF BLOOD CULTURES WHEN MULTIPLE SETS ARE DRAWN IS UNCERTAIN. PLEASE NOTIFY THE MICROBIOLOGY DEPARTMENT WITHIN ONE WEEK IF SPECIATION AND SENSITIVITIES ARE REQUIRED. Performed at [facility], [place] [address], [place], [place] [address]    Report Status 11/09/2018 FINAL  Final  Blood Culture ID Panel (Reflexed)     Status: Abnormal   Collection Time: 11/07/18  1:04 AM  Result Value Ref Range Status   Enterococcus species NOT DETECTED NOT DETECTED Final   Listeria monocytogenes NOT DETECTED NOT DETECTED Final   Staphylococcus species NOT DETECTED NOT DETECTED Final   Staphylococcus aureus (BCID) NOT DETECTED NOT DETECTED Final   Streptococcus species DETECTED (A) NOT DETECTED Final    Comment: Not Enterococcus species, Streptococcus agalactiae, Streptococcus pyogenes, or Streptococcus pneumoniae. CRITICAL RESULT CALLED TO, READ BACK BY AND VERIFIED WITH: Nicholaus Bloom RN 11/07/18 1841 JDW    Streptococcus agalactiae NOT DETECTED NOT DETECTED Final   Streptococcus pneumoniae NOT DETECTED NOT DETECTED Final   Streptococcus pyogenes NOT DETECTED NOT DETECTED Final    Acinetobacter baumannii NOT DETECTED NOT DETECTED Final   Enterobacteriaceae species NOT DETECTED NOT DETECTED Final   Enterobacter cloacae complex NOT DETECTED NOT DETECTED Final   Escherichia coli NOT DETECTED NOT DETECTED Final   Klebsiella oxytoca NOT DETECTED NOT DETECTED Final   Klebsiella  pneumoniae NOT DETECTED NOT DETECTED Final   Proteus species NOT DETECTED NOT DETECTED Final   Serratia marcescens NOT DETECTED NOT DETECTED Final   Haemophilus influenzae NOT DETECTED NOT DETECTED Final   Neisseria meningitidis NOT DETECTED NOT DETECTED Final   Pseudomonas aeruginosa NOT DETECTED NOT DETECTED Final   Candida albicans NOT DETECTED NOT DETECTED Final   Candida glabrata NOT DETECTED NOT DETECTED Final   Candida krusei NOT DETECTED NOT DETECTED Final   Candida parapsilosis NOT DETECTED NOT DETECTED Final   Candida tropicalis NOT DETECTED NOT DETECTED Final    Comment: Performed at [facility], [place] [address] [place], [place] [address]  Blood Culture (routine x 2)     Status: None (Preliminary result)   Collection Time: 11/07/18  1:12 AM  Result Value Ref Range Status   Specimen Description BLOOD RIGHT HAND  Final   Special Requests   Final    AEROBIC BOTTLE ONLY Blood Culture results may not be optimal due to an inadequate volume of blood received in culture bottles   Culture   Final    NO GROWTH 4 DAYS Performed at [facility], [address], [place], [place] [address]    Report Status PENDING  Incomplete  Culture, blood (routine x 2)     Status: None (Preliminary result)   Collection Time: 11/10/18  8:41 AM  Result Value Ref Range Status   Specimen Description LEFT ANTECUBITAL  Final   Special Requests   Final    BOTTLES DRAWN AEROBIC AND ANAEROBIC Blood Culture adequate volume   Culture   Final    NO GROWTH < 24 HOURS Performed at [facility], [address], [place], [place] [address]    Report Status PENDING  Incomplete  Culture, blood (routine x 2)      Status: None (Preliminary result)   Collection Time: 11/10/18  8:41 AM  Result Value Ref Range Status   Specimen Description BLOOD LEFT ARM  Final   Special Requests   Final    BOTTLES DRAWN AEROBIC AND ANAEROBIC Blood Culture adequate volume   Culture   Final    NO GROWTH < 24 HOURS Performed at [facility], [address], [place], [place] [address]    Report Status PENDING  Incomplete     Labs: BNP (last 3 results) Recent Labs    11/06/18 2257  BNP 846.6*   Basic Metabolic Panel: Recent Labs  Lab 11/07/18 0112 11/07/18 0845 11/08/18 0616 11/09/18 0539 11/11/18 0555  NA 128* 130* 133* 133* 136  K 3.8 3.3* 3.6 4.1 4.3  CL 95* 99 103 104 103  CO2 21* 22 22 22 24   GLUCOSE 166* 160* 153* 165* 154*  BUN 13 11 13 13 12   CREATININE 0.77 0.76 0.74 0.67 0.64  CALCIUM 8.1* 7.7* 8.1* 8.4* 8.9   Liver Function Tests: Recent Labs  Lab 11/06/18 2257 11/07/18 0112  AST 23 23  ALT 16 16  ALKPHOS 166* 152*  BILITOT 0.8 0.8  PROT 7.4 6.9  ALBUMIN 3.2* 2.9*   No results for input(s): LIPASE, AMYLASE in the last 168 hours. No results for input(s): AMMONIA in the last 168 hours. CBC: Recent Labs  Lab 11/06/18 2257 11/07/18 0845 11/08/18 0616 11/09/18 0539 11/10/18 0629 11/11/18 0555  WBC 31.2* 20.4* 10.4 9.0  --  5.9  NEUTROABS 28.6* 18.9* 8.8* 7.3  --  3.7  HGB 8.5* 8.3* 7.6* 7.2* 8.1* 7.7*  HCT 29.0* 28.6* 26.8* 26.4* 27.7* 28.0*  MCV  75.3* 74.7* 75.5* 75.4*  --  76.3*  PLT 201 146* 129* 156  --  189   Cardiac Enzymes: No results for input(s): CKTOTAL, CKMB, CKMBINDEX, TROPONINI in the last 168 hours. BNP: Invalid input(s): POCBNP CBG: Recent Labs  Lab 11/10/18 0805 11/10/18 1115 11/10/18 1630 11/10/18 2203 11/11/18 0729  GLUCAP 164* 171* 138* 157* 165*   D-Dimer No results for input(s): DDIMER in the last 72 hours. Hgb A1c No results for input(s): HGBA1C in the last 72 hours. Lipid Profile No results for input(s): CHOL, HDL, LDLCALC, TRIG,  CHOLHDL, LDLDIRECT in the last 72 hours. Thyroid function studies No results for input(s): TSH, T4TOTAL, T3FREE, THYROIDAB in the last 72 hours.  Invalid input(s): FREET3 Anemia work up Recent Labs    11/10/18 0629  FERRITIN 23  TIBC 294  IRON 15*   Urinalysis    Component Value Date/Time   COLORURINE YELLOW 11/07/2018 0128   APPEARANCEUR CLEAR 11/07/2018 0128   LABSPEC 1.013 11/07/2018 0128   PHURINE 5.0 11/07/2018 0128   GLUCOSEU NEGATIVE 11/07/2018 0128   HGBUR NEGATIVE 11/07/2018 0128   BILIRUBINUR NEGATIVE 11/07/2018 0128   KETONESUR NEGATIVE 11/07/2018 0128   PROTEINUR NEGATIVE 11/07/2018 0128   UROBILINOGEN 0.2 07/21/2014 2230   NITRITE NEGATIVE 11/07/2018 0128   LEUKOCYTESUR NEGATIVE 11/07/2018 0128   Sepsis Labs Invalid input(s): PROCALCITONIN,  WBC,  LACTICIDVEN Microbiology Recent Results (from the past 240 hour(s))  SARS Coronavirus 2 (CEPHEID - Performed in [place] hospital lab), Hosp Order     Status: None   Collection Time: 11/07/18 12:32 AM  Result Value Ref Range Status   SARS Coronavirus 2 NEGATIVE NEGATIVE Final    Comment: (NOTE) If result is NEGATIVE SARS-CoV-2 target nucleic acids are NOT DETECTED. The SARS-CoV-2 RNA is generally detectable in upper and lower  respiratory specimens during the acute phase of infection. The lowest  concentration of SARS-CoV-2 viral copies this assay can detect is 250  copies / mL. A negative result does not preclude SARS-CoV-2 infection  and should not be used as the sole basis for treatment or other  patient management decisions.  A negative result may occur with  improper specimen collection / handling, submission of specimen other  than nasopharyngeal swab, presence of viral mutation(s) within the  areas targeted by this assay, and inadequate number of viral copies  (<250 copies / mL). A negative result must be combined with clinical  observations, patient history, and epidemiological information. If  result is POSITIVE SARS-CoV-2 target nucleic acids are DETECTED. The SARS-CoV-2 RNA is generally detectable in upper and lower  respiratory specimens dur ing the acute phase of infection.  Positive  results are indicative of active infection with SARS-CoV-2.  Clinical  correlation with patient history and other diagnostic information is  necessary to determine patient infection status.  Positive results do  not rule out bacterial infection or co-infection with other viruses. If result is PRESUMPTIVE POSTIVE SARS-CoV-2 nucleic acids MAY BE PRESENT.   A presumptive positive result was obtained on the submitted specimen  and confirmed on repeat testing.  While 2019 novel coronavirus  (SARS-CoV-2) nucleic acids may be present in the submitted sample  additional confirmatory testing may be necessary for epidemiological  and / or clinical management purposes  to differentiate between  SARS-CoV-2 and other Sarbecovirus currently known to infect humans.  If clinically indicated additional testing with an alternate test  methodology [phone]) is advised. The SARS-CoV-2 RNA is generally  detectable in upper and lower respiratory  sp ecimens during the acute  phase of infection. The expected result is Negative. Fact Sheet for Patients:  StrictlyIdeas.no Fact Sheet for Healthcare Providers: BankingDealers.co.za This test is not yet approved or cleared by the [place] FDA and has been authorized for detection and/or diagnosis of SARS-CoV-2 by FDA under an Emergency Use Authorization (EUA).  This EUA will remain in effect (meaning this test can be used) for the duration of the COVID-19 declaration under Section 564(b)(1) of the Act, 21 U.S.C. section 360bbb-3(b)(1), unless the authorization is terminated or revoked sooner. Performed at [facility], [address], [place], [place] [address]   Blood Culture (routine x 2)     Status: Abnormal    Collection Time: 11/07/18  1:04 AM  Result Value Ref Range Status   Specimen Description   Final    BLOOD RIGHT ANTECUBITAL Performed at [facility], [address], [place], [place] [address]    Special Requests   Final    BOTTLES DRAWN AEROBIC AND ANAEROBIC Blood Culture adequate volume Performed at [facility], [address] [place], [place] [address]    Culture  Setup Time   Final    GRAM POSITIVE COCCI ANAEROBE BOTTLE Gram Stain Report Called to,Read Back By and Verified With: Narda Bonds AT 2423 BY HFLYNT 11/07/18 GRAM POSITIVE COCCI AEROBIC BOTTLE Organism ID to follow CRITICAL RESULT CALLED TO, READ BACK BY AND VERIFIED WITH: Nicholaus Bloom RN 11/07/18 1841 JDW    Culture (A)  Final    VIRIDANS STREPTOCOCCUS THE SIGNIFICANCE OF ISOLATING THIS ORGANISM FROM A SINGLE SET OF BLOOD CULTURES WHEN MULTIPLE SETS ARE DRAWN IS UNCERTAIN. PLEASE NOTIFY THE MICROBIOLOGY DEPARTMENT WITHIN ONE WEEK IF SPECIATION AND SENSITIVITIES ARE REQUIRED. Performed at [facility], [place] [address], [place], [place] [address]    Report Status 11/09/2018 FINAL  Final  Blood Culture ID Panel (Reflexed)     Status: Abnormal   Collection Time: 11/07/18  1:04 AM  Result Value Ref Range Status   Enterococcus species NOT DETECTED NOT DETECTED Final   Listeria monocytogenes NOT DETECTED NOT DETECTED Final   Staphylococcus species NOT DETECTED NOT DETECTED Final   Staphylococcus aureus (BCID) NOT DETECTED NOT DETECTED Final   Streptococcus species DETECTED (A) NOT DETECTED Final    Comment: Not Enterococcus species, Streptococcus agalactiae, Streptococcus pyogenes, or Streptococcus pneumoniae. CRITICAL RESULT CALLED TO, READ BACK BY AND VERIFIED WITH: Nicholaus Bloom RN 11/07/18 1841 JDW    Streptococcus agalactiae NOT DETECTED NOT DETECTED Final   Streptococcus pneumoniae NOT DETECTED NOT DETECTED Final   Streptococcus pyogenes NOT DETECTED NOT DETECTED Final   Acinetobacter baumannii NOT DETECTED NOT DETECTED  Final   Enterobacteriaceae species NOT DETECTED NOT DETECTED Final   Enterobacter cloacae complex NOT DETECTED NOT DETECTED Final   Escherichia coli NOT DETECTED NOT DETECTED Final   Klebsiella oxytoca NOT DETECTED NOT DETECTED Final   Klebsiella pneumoniae NOT DETECTED NOT DETECTED Final   Proteus species NOT DETECTED NOT DETECTED Final   Serratia marcescens NOT DETECTED NOT DETECTED Final   Haemophilus influenzae NOT DETECTED NOT DETECTED Final   Neisseria meningitidis NOT DETECTED NOT DETECTED Final   Pseudomonas aeruginosa NOT DETECTED NOT DETECTED Final   Candida albicans NOT DETECTED NOT DETECTED Final   Candida glabrata NOT DETECTED NOT DETECTED Final   Candida krusei NOT DETECTED NOT DETECTED Final   Candida parapsilosis NOT DETECTED NOT DETECTED Final   Candida tropicalis NOT DETECTED NOT DETECTED Final    Comment: Performed at [facility], [place] [address], [place], [place] [address]  Blood Culture (routine x 2)     Status: None (Preliminary result)   Collection Time: 11/07/18  1:12 AM  Result Value Ref Range Status   Specimen Description BLOOD RIGHT HAND  Final   Special Requests   Final    AEROBIC BOTTLE ONLY Blood Culture results may not be optimal due to an inadequate volume of blood received in culture bottles   Culture   Final    NO GROWTH 4 DAYS Performed at [facility], [address] [place], [place] [address]    Report Status PENDING  Incomplete  Culture, blood (routine x 2)     Status: None (Preliminary result)   Collection Time: 11/10/18  8:41 AM  Result Value Ref Range Status   Specimen Description LEFT ANTECUBITAL  Final   Special Requests   Final    BOTTLES DRAWN AEROBIC AND ANAEROBIC Blood Culture adequate volume   Culture   Final    NO GROWTH < 24 HOURS Performed at [facility], [address] [place], [place] [address]    Report Status PENDING  Incomplete  Culture, blood (routine x 2)     Status: None (Preliminary result)   Collection Time:  11/10/18  8:41 AM  Result Value Ref Range Status   Specimen Description BLOOD LEFT ARM  Final   Special Requests   Final    BOTTLES DRAWN AEROBIC AND ANAEROBIC Blood Culture adequate volume   Culture   Final    NO GROWTH < 24 HOURS Performed at [facility], [address] [place], [place] [address]    Report Status PENDING  Incomplete     Time coordinating discharge: 45 minutes  SIGNED:   Tawni Millers, MD  [facility] 11/11/2018, 7:56 AM

## 2018-11-12 LAB — CULTURE, BLOOD (ROUTINE X 2): Culture: NO GROWTH

## 2018-11-13 ENCOUNTER — Telehealth: Payer: Self-pay | Admitting: Cardiology

## 2018-11-13 NOTE — Telephone Encounter (Signed)
Virtual Visit Pre-Appointment Phone Call  "(Name), I am calling you today to discuss your upcoming appointment. We are currently trying to limit exposure to the virus that causes COVID-19 by seeing patients at home rather than in the office."  1. "What is the BEST phone number to call the day of the visit?" - include this in appointment notes  2. Do you have or have access to (through a family member/friend) a smartphone with video capability that we can use for your visit?" a. If yes - list this number in appt notes as cell (if different from BEST phone #) and list the appointment type as a VIDEO visit in appointment notes b. If no - list the appointment type as a PHONE visit in appointment notes  3. Confirm consent - "In the setting of the current Covid19 crisis, you are scheduled for a (phone or video) visit with your provider on (date) at (time).  Just as we do with many in-office visits, in order for you to participate in this visit, we must obtain consent.  If you'd like, I can send this to your mychart (if signed up) or email for you to review.  Otherwise, I can obtain your verbal consent now.  All virtual visits are billed to your insurance company just like a normal visit would be.  By agreeing to a virtual visit, we'd like you to understand that the technology does not allow for your provider to perform an examination, and thus may limit your provider's ability to fully assess your condition. If your provider identifies any concerns that need to be evaluated in person, we will make arrangements to do so.  Finally, though the technology is pretty good, we cannot assure that it will always work on either your or our end, and in the setting of a video visit, we may have to convert it to a phone-only visit.  In either situation, we cannot ensure that we have a secure connection.  Are you willing to proceed?" STAFF: Did the patient verbally acknowledge consent to telehealth visit? Document  YES/NO here: YES   4. Advise patient to be prepared - "Two hours prior to your appointment, go ahead and check your blood pressure, pulse, oxygen saturation, and your weight (if you have the equipment to check those) and write them all down. When your visit starts, your provider will ask you for this information. If you have an Apple Watch or Kardia device, please plan to have heart rate information ready on the day of your appointment. Please have a pen and paper handy nearby the day of the visit as well."  5. Give patient instructions for MyChart download to smartphone OR [email or web address] as below if video visit (depending on what platform provider is using)  6. Inform patient they will receive a phone call 15 minutes prior to their appointment time (may be from unknown caller ID) so they should be prepared to answer    TELEPHONE CALL NOTE  [name] [name] has been deemed a candidate for a follow-up tele-health visit to limit community exposure during the Covid-19 pandemic. I spoke with the patient via phone to ensure availability of phone/video source, confirm preferred email & phone number, and discuss instructions and expectations.  I reminded [name] [name] to be prepared with any vital sign and/or heart rhythm information that could potentially be obtained via home monitoring, at the time of her visit. I reminded [name] [name] to expect a phone call prior  to her visit.  Terry L Goins 11/13/2018 11:02 AM

## 2018-11-14 ENCOUNTER — Other Ambulatory Visit: Payer: Self-pay

## 2018-11-14 ENCOUNTER — Encounter: Payer: Self-pay | Admitting: Cardiology

## 2018-11-14 ENCOUNTER — Telehealth (INDEPENDENT_AMBULATORY_CARE_PROVIDER_SITE_OTHER): Payer: PPO | Admitting: Cardiology

## 2018-11-14 VITALS — BP 154/76 | HR 98 | Ht 65.0 in | Wt 295.0 lb

## 2018-11-14 DIAGNOSIS — I4891 Unspecified atrial fibrillation: Secondary | ICD-10-CM | POA: Diagnosis not present

## 2018-11-14 DIAGNOSIS — I10 Essential (primary) hypertension: Secondary | ICD-10-CM

## 2018-11-14 DIAGNOSIS — E118 Type 2 diabetes mellitus with unspecified complications: Secondary | ICD-10-CM | POA: Diagnosis not present

## 2018-11-14 DIAGNOSIS — G4733 Obstructive sleep apnea (adult) (pediatric): Secondary | ICD-10-CM

## 2018-11-14 DIAGNOSIS — Z7189 Other specified counseling: Secondary | ICD-10-CM | POA: Diagnosis not present

## 2018-11-14 DIAGNOSIS — F329 Major depressive disorder, single episode, unspecified: Secondary | ICD-10-CM | POA: Diagnosis not present

## 2018-11-14 DIAGNOSIS — E063 Autoimmune thyroiditis: Secondary | ICD-10-CM | POA: Diagnosis not present

## 2018-11-14 DIAGNOSIS — I4819 Other persistent atrial fibrillation: Secondary | ICD-10-CM

## 2018-11-14 DIAGNOSIS — E611 Iron deficiency: Secondary | ICD-10-CM | POA: Diagnosis not present

## 2018-11-14 DIAGNOSIS — I872 Venous insufficiency (chronic) (peripheral): Secondary | ICD-10-CM | POA: Diagnosis not present

## 2018-11-14 DIAGNOSIS — Z681 Body mass index (BMI) 19 or less, adult: Secondary | ICD-10-CM | POA: Diagnosis not present

## 2018-11-14 DIAGNOSIS — A419 Sepsis, unspecified organism: Secondary | ICD-10-CM | POA: Diagnosis not present

## 2018-11-14 DIAGNOSIS — R600 Localized edema: Secondary | ICD-10-CM | POA: Diagnosis not present

## 2018-11-14 DIAGNOSIS — D511 Vitamin B12 deficiency anemia due to selective vitamin B12 malabsorption with proteinuria: Secondary | ICD-10-CM | POA: Diagnosis not present

## 2018-11-14 NOTE — Patient Instructions (Signed)
Medication Instructions: Your physician recommends that you continue on your current medications as directed. Please refer to the Current Medication list given to you today.   Labwork: CBC and BMET JUST BEFORE next visit in 6 months  Procedures/Testing: None today  Follow-Up: 6 months with Dr.McDowell  Any Additional Special Instructions Will Be Listed Below (If Applicable).     If you need a refill on your cardiac medications before your next appointment, please call your pharmacy.     Thank you for choosing [facility] !

## 2018-11-14 NOTE — Progress Notes (Signed)
Virtual Visit via Telephone Note   This visit type was conducted due to national recommendations for restrictions regarding the COVID-19 Pandemic (e.g. social distancing) in an effort to limit this patient's exposure and mitigate transmission in our community.  Due to her co-morbid illnesses, this patient is at least at moderate risk for complications without adequate follow up.  This format is felt to be most appropriate for this patient at this time.  The patient did not have access to video technology/had technical difficulties with video requiring transitioning to audio format only (telephone).  All issues noted in this document were discussed and addressed.  No physical exam could be performed with this format.  Please refer to the patient's chart for her  consent to telehealth for [facility].   Date:  11/14/2018   ID:  [name] [name], DOB 10-05-1955, MRN [id number]  Patient Location: Home Provider Location: Home  PCP:  Redmond School, MD  Cardiologist:  Rozann Lesches, MD Electrophysiologist:  None   Evaluation Performed:  Follow-Up Visit  Chief Complaint:  Cardiac follow-up  History of Present Illness:    [name] [name] is a 63 y.o. female last seen in November 2019.  She did not have video access and we spoke by phone today.  I reviewed her recent records, she was hospitalized at [facility] with sepsis in the setting of left lower extremity cellulitis, treated with antibiotics with 1 blood culture growing Staphylococcus viridans.  SARS coronavirus 2 was negative.  She improved clinically, transthoracic echocardiogram showed no obvious  vegetation.  She was also found to be significantly anemic treated with 1 dose of IV iron deficiency.  She was just discharged on May 24.  She tells me that she saw her PCP this morning, also plans to do home PT to help get herself built back up.  She continues on outpatient antibiotics.  Otherwise, no specific sense of palpitations or chest  pain.  Cardiac regimen is stable as outlined below.  The patient does not have symptoms concerning for COVID-19 infection (fever, chills, cough, or new shortness of breath).    Past Medical History:  Diagnosis Date  . Anemia   . Anxiety   . Arthritis   . Asthma   . Atrial fibrillation ([place])   . Cirrhosis of liver ([place])   . COPD (chronic obstructive pulmonary disease) ([place])   . Depression   . Edema   . Essential hypertension, benign   . GERD (gastroesophageal reflux disease)   . Hypothyroidism   . Morbid obesity ([place])   . Sleep apnea    CPAP - not consistent  . Thyroid cancer ([place])   . Type 2 diabetes mellitus ([place])    Past Surgical History:  Procedure Laterality Date  . "pump bumps"     bilateral heels--2000  . ABDOMINAL HYSTERECTOMY  1985  . CARDIAC CATHETERIZATION  2012   Dr. Lia Foyer told her nothing was wrong.  [name] [place]  . CHOLECYSTECTOMY  1996  . COLONOSCOPY WITH PROPOFOL N/A 04/17/2015   Procedure: COLONOSCOPY WITH PROPOFOL  at cecum at 0810; withdrawal time =15 minutes;  Surgeon: [name], MD;  Location: [facility];  Service: Endoscopy;  Laterality: N/A;  . Debridement of abdominal wound  2011  . ESOPHAGEAL DILATION N/A 04/17/2015   Procedure: ESOPHAGEAL DILATION WITH 56FR MALONEY DILATOR;  Surgeon: [name], MD;  Location: [facility];  Service: Endoscopy;  Laterality: N/A;  . ESOPHAGOGASTRODUODENOSCOPY (EGD) WITH PROPOFOL N/A 04/17/2015  Procedure: ESOPHAGOGASTRODUODENOSCOPY (EGD) WITH PROPOFOL;  Surgeon: [name], MD;  Location: [facility];  Service: Endoscopy;  Laterality: N/A;  . EXPLORATORY LAPAROTOMY  2003  . FOOT SURGERY Bilateral 2000   Bone spur removed  . INCISIONAL HERNIA REPAIR N/A 06/09/2014   Procedure: RECURRENT  INCISIONAL HERNIORRHAPHY WITH MESH;  Surgeon: [name], MD;  Location: [facility];  Service: General;  Laterality: N/A;  . INCISIONAL HERNIA REPAIR N/A 10/15/2014   Procedure: [place];  Surgeon: [name], MD;  Location: [facility];  Service: General;  Laterality: N/A;  . INSERTION OF MESH N/A 06/09/2014   Procedure: INSERTION OF MESH;  Surgeon: [name], MD;  Location: [facility];  Service: General;  Laterality: N/A;  . INSERTION OF MESH N/A 10/15/2014   Procedure: INSERTION OF MESH;  Surgeon: [name], MD;  Location: [facility];  Service: General;  Laterality: N/A;  . POLYPECTOMY  04/17/2015   Procedure: POLYPECTOMY;  Surgeon: [name], MD;  Location: [facility];  Service: Endoscopy;;  . RESECTION DISTAL CLAVICAL  05/07/2012   Procedure: RESECTION DISTAL CLAVICAL;  Surgeon: [name], MD;  Location: [facility];  Service: Orthopedics;  Laterality: Right;  . SHOULDER OPEN ROTATOR CUFF REPAIR  05/07/2012   Procedure: ROTATOR CUFF REPAIR SHOULDER OPEN;  Surgeon: [name], MD;  Location: [facility];  Service: Orthopedics;  Laterality: Right;  . THYROIDECTOMY  2009  . TONSILLECTOMY  1958  . UMBILICAL HERNIA REPAIR  2010     Current Meds  Medication Sig  . albuterol (PROVENTIL HFA) 108 (90 BASE) MCG/ACT inhaler Inhale 2 puffs into the lungs every 6 (six) hours as needed for wheezing or shortness of breath. Shortness of breath  . apixaban (ELIQUIS) 5 MG TABS tablet Take 1 tablet (5 mg total) by mouth 2 (two) times daily.  . cephALEXin (KEFLEX) 500 MG capsule Take 1 capsule (500 mg total) by mouth every 6 (six) hours for 5 days.  . colesevelam (WELCHOL) 625 MG tablet Take 1,875 mg by mouth as directed. 3 tabs am 3 tabs pm  . diltiazem (CARDIZEM CD) 360 MG 24 hr capsule Take 1 capsule (360 mg total) by mouth daily.  . DULoxetine (CYMBALTA) 30 MG capsule Take 30 mg by mouth daily.   . DULoxetine (CYMBALTA) 60 MG capsule Take 60 mg by mouth daily.   [name] econazole nitrate 1 % cream Apply topically daily. (Patient taking differently: Apply 1 application topically daily as needed (for rash irritation). )  . furosemide (LASIX) 20 MG tablet Take 20 mg by mouth  daily as needed for fluid.   [name] gabapentin (NEURONTIN) 300 MG capsule Take 300 mg by mouth daily.  [name] HYDROcodone-acetaminophen (NORCO) 10-325 MG tablet Take 1 tablet by mouth every hour as needed for moderate pain.   . hydrOXYzine (ATARAX/VISTARIL) 25 MG tablet Take 25 mg by mouth 4 (four) times daily as needed.  [name] LANTUS SOLOSTAR 100 UNIT/ML Solostar Pen Inject 44 Units into the skin.   [name] levothyroxine (SYNTHROID) 200 MCG tablet Take 200 mcg by mouth daily before breakfast. Take in addition to 75 mcg for a total of 275 mcg daily  . levothyroxine (SYNTHROID) 75 MCG tablet Take 50-75 mcg by mouth daily before breakfast. DOSE NEEDS TO BE VERIFIED!  [name] losartan (COZAAR) 100 MG tablet Take 100 mg by mouth daily.   . meloxicam (MOBIC) 15 MG tablet Take 15 mg by mouth daily.   . metFORMIN (GLUCOPHAGE) 1000 MG tablet Take  1,000 mg by mouth 2 (two) times daily with a meal.  . nystatin (NYSTOP) 100000 UNIT/GM POWD Apply 100,000 g topically daily as needed (for rash-irritation). Rash  . nystatin cream (MYCOSTATIN) Apply 1 application topically 3 (three) times daily.   . potassium chloride SA (K-DUR,KLOR-CON) 20 MEQ tablet Take 20 mEq by mouth 2 (two) times daily.   . pravastatin (PRAVACHOL) 20 MG tablet Take 20 mg by mouth daily.  [name] SYNTHROID 200 MCG tablet Take 1 tablet by mouth daily. DOSE NEEDS TO BE VERIFIED  . tolterodine (DETROL LA) 4 MG 24 hr capsule Take 4 mg by mouth 2 (two) times a day.  . traZODone (DESYREL) 100 MG tablet Take 100-200 mg by mouth at bedtime.     Allergies:   Doxycycline; Adhesive [tape]; Biaxin [clarithromycin]; Percocet [oxycodone-acetaminophen]; Xarelto [rivaroxaban]; Clarithromycin; Clindamycin/lincomycin; Neosporin [neomycin-polymyxin b gu]; and Penicillins   Social History   Tobacco Use  . Smoking status: Never Smoker  . Smokeless tobacco: Never Used  Substance Use Topics  . Alcohol use: No    Alcohol/week: 0.0 standard drinks  . Drug use: No     Family Hx: The  patient's family history includes Arthritis in an other family member; Asthma in an other family member; Cancer in an other family member; Diabetes in an other family member; Heart disease in an other family member; Kidney disease in an other family member.  ROS:   Please see the history of present illness.    All other systems reviewed and are negative.   Prior CV studies:   The following studies were reviewed today:  Echocardiogram 11/10/2018:  1. The left ventricle has normal systolic function with an ejection fraction of 60-65%. The cavity size was normal. There is moderately increased left ventricular wall thickness. Left ventricular diastolic Doppler parameters are consistent with  pseudonormalization.  2. The right ventricle has normal systolic function. The cavity was normal. There is no increase in right ventricular wall thickness.  3. Pt appears to have paroxysmal atrial fib with episodes of NSR.  4. The tricuspid valve is grossly normal.  5. The ascending aorta and aortic root are normal in size and structure.  6. The interatrial septum was not assessed.  Labs/Other Tests and Data Reviewed:    EKG:  An ECG dated 11/06/2018 was personally reviewed today and demonstrated:  Sinus tachycardia with PACs.  Recent Labs: 11/06/2018: B Natriuretic Peptide 106.0 11/07/2018: ALT 16; TSH 1.393 11/11/2018: BUN 12; Creatinine, Ser 0.64; Hemoglobin 7.7; Platelets 189; Potassium 4.3; Sodium 136    Wt Readings from Last 3 Encounters:  11/14/18 295 lb (133.8 kg)  11/06/18 249 lb (112.9 kg)  05/26/18 (!) 305 lb (138.3 kg)     Objective:    Vital Signs:  BP (!) 154/76   Pulse 98   Ht 5' 5"  (1.651 m)   Wt 295 lb (133.8 kg)   BMI 49.09 kg/m    Patient spoke in full sentences, not short of breath. No audible wheezing. Speech pattern normal.  ASSESSMENT & PLAN:    1.  Persistent atrial fibrillation, no active palpitations.  Cardizem CD and Eliquis for stroke prophylaxis.  Recent lab  work reviewed.  2.  Essential hypertension, no changes made to current regimen.  Keep follow-up with PCP.  3.  OSA, not consistent with CPAP therapy.  Encouraged regular follow-up with PCP and sleep medicine specialist.  4.  Recent hospitalization with left lower extremity cellulitis.  She continues on outpatient antibiotics with follow-up by PCP.  COVID-19 Education: The signs and symptoms of COVID-19 were discussed with the patient and how to seek care for testing (follow up with PCP or arrange E-visit).  The importance of social distancing was discussed today.  Time:   Today, I have spent 5 minutes with the patient with telehealth technology discussing the above problems.     Medication Adjustments/Labs and Tests Ordered: Current medicines are reviewed at length with the patient today.  Concerns regarding medicines are outlined above.   Tests Ordered: Orders Placed This Encounter  Procedures  . CBC  . Basic Metabolic Panel (BMET)    Medication Changes: No orders of the defined types were placed in this encounter.   Disposition:  Follow up 6 months in the [place] office.  Signed, Rozann Lesches, MD  11/14/2018 4:06 PM    [facility]

## 2018-11-15 LAB — CULTURE, BLOOD (ROUTINE X 2)
Culture: NO GROWTH
Culture: NO GROWTH
Special Requests: ADEQUATE
Special Requests: ADEQUATE

## 2018-11-19 ENCOUNTER — Other Ambulatory Visit: Payer: Self-pay

## 2018-11-19 MED ORDER — APIXABAN 5 MG PO TABS: 5.0000 mg | ORAL_TABLET | Freq: Two times a day (BID) | ORAL | 3 refills | Status: DC

## 2018-11-19 NOTE — Telephone Encounter (Signed)
° ° ° °  1. Which medications need to be refilled? (please list name of each medication and dose if known) apixaban (ELIQUIS) 5 MG TABS    2. Which pharmacy/location (including street and city if local pharmacy) is medication to be sent to? [facility]   3. Do they need a 30 day or 90 day supply? [place]

## 2018-11-19 NOTE — Patient Outreach (Signed)
[facility]) Care Management  11/19/2018  [name] [name] [birth year] [id number]   EMMI- General Discharge RED ON EMMI ALERT Day # 4 Date: 11/16/2018 Red Alert Reason:  Lost interest in things? Yes  Sad/hopeless/anxious/empty? Yes    Outreach attempt: spoke with patient. She is able to verify HIPAA.  She states she is doing ok.  Addressed red alerts.  She reports that she has some down times due to her recently being sick.  Patient denies ant thoughts on harming herself.  Patient states that she has seen her PCP and several follow up appointments coming up.  Patient denies any further needs or concerns.     Plan: RN CM will close case.    [name], RN, MSN [facility] Care Management Care Management Coordinator Direct Line [phone] Toll Free: [phone]  Fax: [phone]

## 2018-11-19 NOTE — Telephone Encounter (Signed)
refilled eliquis 5 mg 90 day supply

## 2018-11-21 ENCOUNTER — Encounter (HOSPITAL_COMMUNITY): Payer: Self-pay

## 2018-11-21 ENCOUNTER — Other Ambulatory Visit: Payer: Self-pay

## 2018-11-21 ENCOUNTER — Ambulatory Visit (HOSPITAL_COMMUNITY): Payer: PPO | Attending: Internal Medicine

## 2018-11-21 DIAGNOSIS — R2689 Other abnormalities of gait and mobility: Secondary | ICD-10-CM | POA: Insufficient documentation

## 2018-11-21 DIAGNOSIS — M6281 Muscle weakness (generalized): Secondary | ICD-10-CM | POA: Diagnosis not present

## 2018-11-21 NOTE — Therapy (Signed)
[place], [place], [address] Phone: [phone]   Fax:  [phone]  Physical Therapy Evaluation  Patient Details  Name: [name] [name] MRN: [id number] Date of Birth: Jul 17, 1955 Referring Provider (PT): Arrien, Jimmy Picket, MD   Encounter Date: 11/21/2018  PT End of Session - 11/21/18 1343    Visit Number  1    Number of Visits  12    Date for PT Re-Evaluation  01/02/19    Authorization Type  [employer] (no auth, no visit limit, $10 co-pay)    Authorization Time Period  11/21/18-01/02/19    PT Start Time  1310    PT Stop Time  1350    PT Time Calculation (min)  40 min    Equipment Utilized During Treatment  Gait belt    Activity Tolerance  Patient tolerated treatment well;Patient limited by fatigue;Other (comment)   pt has high resting heart rate, monitored and limited activity   Behavior During Therapy  [facility] for tasks assessed/performed       Past Medical History:  Diagnosis Date  . Anemia   . Anxiety   . Arthritis   . Asthma   . Atrial fibrillation ([place])   . Cirrhosis of liver ([place])   . COPD (chronic obstructive pulmonary disease) ([place])   . Depression   . Edema   . Essential hypertension, benign   . GERD (gastroesophageal reflux disease)   . Hypothyroidism   . Morbid obesity ([place])   . Sleep apnea    CPAP - not consistent  . Thyroid cancer ([place])   . Type 2 diabetes mellitus ([place])     Past Surgical History:  Procedure Laterality Date  . "pump bumps"     bilateral heels--2000  . ABDOMINAL HYSTERECTOMY  1985  . CARDIAC CATHETERIZATION  2012   Dr. Lia Foyer told her nothing was wrong.  [name] [place]  . CHOLECYSTECTOMY  1996  . COLONOSCOPY WITH PROPOFOL N/A 04/17/2015   Procedure: COLONOSCOPY WITH PROPOFOL  at cecum at 0810; withdrawal time =15 minutes;  Surgeon: [name], MD;  Location: [facility];  Service: Endoscopy;  Laterality: N/A;  . Debridement of abdominal wound   2011  . ESOPHAGEAL DILATION N/A 04/17/2015   Procedure: ESOPHAGEAL DILATION WITH 56FR MALONEY DILATOR;  Surgeon: [name], MD;  Location: [facility];  Service: Endoscopy;  Laterality: N/A;  . ESOPHAGOGASTRODUODENOSCOPY (EGD) WITH PROPOFOL N/A 04/17/2015   Procedure: ESOPHAGOGASTRODUODENOSCOPY (EGD) WITH PROPOFOL;  Surgeon: [name], MD;  Location: [facility];  Service: Endoscopy;  Laterality: N/A;  . EXPLORATORY LAPAROTOMY  2003  . FOOT SURGERY Bilateral 2000   Bone spur removed  . INCISIONAL HERNIA REPAIR N/A 06/09/2014   Procedure: RECURRENT  INCISIONAL HERNIORRHAPHY WITH MESH;  Surgeon: [name], MD;  Location: [facility];  Service: General;  Laterality: N/A;  . INCISIONAL HERNIA REPAIR N/A 10/15/2014   Procedure: [place];  Surgeon: [name], MD;  Location: [place];  Service: General;  Laterality: N/A;  . INSERTION OF MESH N/A 06/09/2014   Procedure: INSERTION OF MESH;  Surgeon: [name], MD;  Location: [facility];  Service: General;  Laterality: N/A;  . INSERTION OF MESH N/A 10/15/2014   Procedure: INSERTION OF MESH;  Surgeon: [name], MD;  Location: [place];  Service: General;  Laterality: N/A;  . POLYPECTOMY  04/17/2015   Procedure: POLYPECTOMY;  Surgeon: [name], MD;  Location: [facility];  Service: Endoscopy;;  . RESECTION DISTAL CLAVICAL  05/07/2012   Procedure: RESECTION DISTAL CLAVICAL;  Surgeon: [name], MD;  Location: [facility];  Service: Orthopedics;  Laterality: Right;  . SHOULDER OPEN ROTATOR CUFF REPAIR  05/07/2012   Procedure: ROTATOR CUFF REPAIR SHOULDER OPEN;  Surgeon: [name], MD;  Location: [facility];  Service: Orthopedics;  Laterality: Right;  . THYROIDECTOMY  2009  . TONSILLECTOMY  1958  . UMBILICAL HERNIA REPAIR  2010    There were no vitals filed for this visit.   Subjective Assessment - 11/21/18 1323    Subjective  Patient reports she was admitted to the hospital ~ 2 weeks ago for sepsis  related to her cellulitis and wounds on the Lt lower leg. She was discharged home the following week. She reports she doesn't remember a lot of her first day in the hospital but states Dr. Cathlean Sauer was very good at explaining what was going on and why she was admitted. She states her cellulitis is improving and the redness has reduced but she still has swelling and scaly bumpy skin. She has had wound care previously but has not been referred for it at this time. She reports she falls often but denies falls since returning home from the hospital.     Pertinent History  history significant of anemia, anxiety/depression, osteoarthritis, asthma, chronic atrial fibrillation, nonalcoholic cirrhosis of the liver, COPD, erythema/lymphedema, essential hypertension, GERD, hypothyroidism/thyroidectomy/history of thyroid cancer, morbid obesity, sleep apnea not on CPAP, type 2 diabetes mellitus     Limitations  Standing;Walking;House hold activities    Patient Stated Goals  to be able to walk better    Currently in Pain?  No/denies         [facility] PT Assessment - 11/21/18 0001      Assessment   Medical Diagnosis  Generalized Weakness    Referring Provider (PT)  Arrien, Jimmy Picket, MD    Onset Date/Surgical Date  11/06/18    Prior Therapy  PT at hospital, wound care previously       Precautions   Precautions  None    Precaution Comments  pt is tachycardic at rest (95-101 bpm) monitor during therapy      Restrictions   Weight Bearing Restrictions  No      Balance Screen   Has the patient fallen in the past 6 months  Yes    How many times?  3   going up the steps, tripped over her own feet   Has the patient had a decrease in activity level because of a fear of falling?   Yes    Is the patient reluctant to leave their home because of a fear of falling?   No      Home Film/video editor residence    Living Arrangements  Alone;Other relatives   20 y/o granddaughter staying with  her right now   Available Help at Discharge  Family;Friend(s);Other (Comment)   pt can call landlord and has a friend in [place]   needs some boards replaced   Home Layout  One level    [place] - quad;Bedside commode;Walker - 2 wheels;Wheelchair - manual    Additional Comments  pt reports her granddaughter is helping her with cleaning the house and doing laundry.      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 She crochets and enjoyed playing bingo at the center.      Cognition   Overall Cognitive Status  Within Functional Limits for tasks assessed      Observation/Other Assessments   Focus on Therapeutic Outcomes (FOTO)   NA      Functional Tests   Functional tests  Single leg stance      Single Leg Stance   Comments  Bil LE = 0 seconds      Posture/Postural Control   Posture/Postural Control  Postural limitations    Postural Limitations  Rounded Shoulders;Forward head;Increased lumbar lordosis      ROM / Strength   AROM / PROM / Strength  Strength      Strength   Strength Assessment Site  Hip;Knee;Ankle    Right Hip Flexion  4+/5    Right Hip Extension  4/5    Right Hip ABduction  3+/5    Left Hip Flexion  4+/5    Left Hip Extension  4/5    Left Hip ABduction  3+/5    Right/Left Knee  Right;Left    Right Knee Flexion  3+/5    Right Knee Extension  4+/5    Left Knee Flexion  4-/5    Left Knee Extension  4+/5    Right Ankle Dorsiflexion  4+/5    Left Ankle Dorsiflexion  4+/5      Transfers   Five time sit to stand comments   16.8 with UE use on thighs      Ambulation/Gait   Ambulation/Gait  Yes    Ambulation/Gait Assistance  6:  Modified independent (Device/Increase time)    Ambulation Distance (Feet)  226 Feet   2MWT   Assistive device  None   pt using wall for support when fatigued   Gait Pattern  Step-through pattern;Decreased step length - right;Decreased step length - left;Decreased stride length;Decreased hip/knee flexion - right;Decreased hip/knee flexion - left;Wide base of support    Ambulation Surface  Level;Indoor    Gait velocity  0.55 m/s    Gait Comments  pt with 1 standing rest break leaning against a wall for support during test       Objective measurements completed on examination: See above findings.    [facility] Adult PT Treatment/Exercise - 11/21/18 0001      Exercises   Exercises  Knee/Hip      Knee/Hip Exercises: Sidelying   Clams  1x 10 reps on Lt LE for education to perform HEP at home       PT Education - 11/21/18 1400    Education Details  Educated on exam findings and on plan to address weakness and balance impairments. Educated on initial HEP.    Person(s) Educated  Patient    Methods  Explanation;Handout    Comprehension  Verbalized understanding;Returned demonstration       PT Short Term Goals - 11/21/18 1515      PT SHORT TERM GOAL #1   Title  Patient will be independent with HEP to improve functional strength for improved endurance and mobility.     Time  2    Period  Weeks    Status  New    Target Date  12/05/18      PT SHORT TERM GOAL #2   Title  Patient will demonstrate improved activity tolerance by increasin gait velocity in 2MWT by 0.1 m/s with LRAD.    Time  3    Period  Weeks    Status  New    Target Date  12/12/18      PT SHORT TERM GOAL #3   Title  Patient will verablize understanding of safety improvement when using quad cane or SPC for mobility to reduce fall risk.    Time  3    Period  Weeks    Status  New    Target Date  12/12/18        PT Long Term Goals - 11/21/18 1516      PT LONG TERM GOAL #1   Title  Patient will ambulate for 3MWT  with no rest breaks using LRAD and manitaining gait velocity of 0.65 m/s or greater to indicate improved activity tolerance.     Time  6    Period  Weeks    Status  New    Target Date  01/02/19      PT LONG TERM GOAL #2   Title  Patient will perform 5x sit to stand in = or < 12 seconds with no UE use from standard height chair to indicate improved functional LE strength.    Time  6    Period  Weeks    Status  New    Target Date  01/02/19      PT LONG TERM GOAL #3   Title  Patient will demonstrate ability to perform SLS for 10 seconds on bil LE to indicate improved balance in order to reduce risk of falling.     Time  6    Period  Weeks    Target Date  01/02/19        Plan - 11/21/18 1401    Clinical Impression Statement  Ms. Matusek presents to physical therapy for evaluation for generalized weakness and debilitation following recent hospital stay. She was admitted to [facility] on 11/06/18 and found to have sepsis related to Lt LE cellulitis. She was discharged on 11/11/18 with antibiotics and her Lt [place] Comment remains swollen with slightly scaly appearance of her skin but no excessive erythema present. She has reported feeling reduced activity tolerance and decreased energy since returning home and has a significant history of falling. Objective testing reveals  bil hip weakness, impaired balance, and decreased endurance/activity tolerance. Ms. Paradiso was noted to have resting HR of 95-101 in sitting and HR increased to 113 at end of 2MWT this date; she denied any SOB, dizziness, chest pain or tightness throughout. She will benefit from skilled PT interventions to improve strength and mobility for improved QOL.    Personal Factors and Comorbidities  Age;Comorbidity 3+;Fitness    Comorbidities  DM, COPD, HTN    Examination-Activity Limitations  Continence;Stairs;Locomotion Level;Squat;Lift    Examination-Participation Restrictions  Cleaning;Community Activity    Stability/Clinical Decision Making   Stable/Uncomplicated    Clinical Decision Making  Low    Rehab Potential  Fair    PT Frequency  2x / week    PT Duration  6 weeks    PT Treatment/Interventions  ADLs/Self Care Home Management;Aquatic Therapy;Cryotherapy;Electrical Stimulation;Moist Heat;Gait training;Stair training;Functional mobility training;DME Instruction;Therapeutic activities;Therapeutic exercise;Balance training;Neuromuscular re-education;Patient/family education;Manual techniques    PT Next Visit Plan  Review evaluation and goals. Focus on hip strengthening (pt  uncomfortable in prone and bridges hurt her back) try standig hip extension. Monitor HR as pt is tachy at rest (95-101). Updated HEP with sit<>stand next session.    PT Home Exercise Plan  Eval : clamshell;    Consulted and Agree with Plan of Care  Patient       Patient will benefit from skilled therapeutic intervention in order to improve the following deficits and impairments:  Abnormal gait, Decreased balance, Decreased mobility, Decreased skin integrity, Difficulty walking, Obesity, Decreased strength, Decreased activity tolerance, Decreased safety awareness, Decreased knowledge of use of DME, Increased edema, Improper body mechanics  Visit Diagnosis: Muscle weakness (generalized)  Other abnormalities of gait and mobility     Problem List Patient Active Problem List   Diagnosis Date Noted  . Cellulitis left lower extremity.  11/11/2018  . Sepsis due to undetermined organism ([place]) 11/07/2018  . Atrial fibrillation ([place]) 11/07/2018  . COPD (chronic obstructive pulmonary disease) ([place]) 11/07/2018  . GERD (gastroesophageal reflux disease) 11/07/2018  . Hypothyroidism 11/07/2018  . Sleep apnea 11/07/2018  . Hepatic cirrhosis ([place]) 09/01/2014  . Cirrhosis, nonalcoholic ([place]) 31/51/7616  . Incisional hernia 06/09/2014  . S/P complete repair of rotator cuff 07/24/2012  . Precordial pain 09/30/2010  . Essential hypertension, benign 09/30/2010  . Type 2  diabetes mellitus ([place]) 09/05/2008  . OBESITY-MORBID (>100') 09/05/2008  . ESOPHAGEAL REFLUX 09/05/2008  . EDEMA 09/05/2008    Kipp Brood, PT, DPT, [facility] Physical Therapist with [facility]  11/21/2018 3:49 PM    [place], [place], [address] Phone: [phone]   Fax:  [phone]  Name: HAILLY FESS MRN: [id number] Date of Birth: 1956-04-07

## 2018-11-21 NOTE — Patient Instructions (Signed)
Clamshell reps: 10 sets: 3 hold: 3 seconds daily: 1  weekly: 7      Exercise image step 1   Exercise image step 2 Setup  Begin lying on your side with your legs bent and feet together. Movement  Lift your top knee upward while keeping your feet together, then lower it back down and repeat. Tip  Make sure that your hips do not fall backward as you lift your leg. Focus your awareness on the movement of your leg.

## 2018-11-22 DIAGNOSIS — L308 Other specified dermatitis: Secondary | ICD-10-CM | POA: Diagnosis not present

## 2018-11-23 ENCOUNTER — Encounter (HOSPITAL_COMMUNITY): Payer: Self-pay

## 2018-11-23 ENCOUNTER — Other Ambulatory Visit: Payer: Self-pay

## 2018-11-23 ENCOUNTER — Ambulatory Visit (HOSPITAL_COMMUNITY): Payer: PPO

## 2018-11-23 DIAGNOSIS — R2689 Other abnormalities of gait and mobility: Secondary | ICD-10-CM

## 2018-11-23 DIAGNOSIS — M6281 Muscle weakness (generalized): Secondary | ICD-10-CM | POA: Diagnosis not present

## 2018-11-23 NOTE — Patient Instructions (Signed)
Functional Quadriceps: Sit to Stand    Sit on edge of chair, feet flat on floor. Stand upright, extending knees fully. Repeat 10 times per set. Do 2 sets per session. Do 2 sessions per day.  http://orth.exer.us/735   Copyright  VHI. All rights reserved.

## 2018-11-23 NOTE — Therapy (Signed)
[place], [place], [address] Phone: [phone]   Fax:  [phone]  Physical Therapy Treatment  Patient Details  Name: [name] [name] MRN: [id number] Date of Birth: 27-Jun-1955 Referring Provider (PT): Arrien, Jimmy Picket, MD   Encounter Date: 11/23/2018  PT End of Session - 11/23/18 1409    Visit Number  2    Number of Visits  12    Date for PT Re-Evaluation  01/02/19    Authorization Type  [employer] (no auth, no visit limit, $10 co-pay)    Authorization Time Period  11/21/18-01/02/19    PT Start Time  6433    PT Stop Time  1442    PT Time Calculation (min)  39 min    Activity Tolerance  Patient tolerated treatment well;Patient limited by fatigue;Other (comment)   pt with high heart rate at rest, monitored through session.  Limited by fatigue wiht activity   Behavior During Therapy  [facility] for tasks assessed/performed       Past Medical History:  Diagnosis Date  . Anemia   . Anxiety   . Arthritis   . Asthma   . Atrial fibrillation ([place])   . Cirrhosis of liver ([place])   . COPD (chronic obstructive pulmonary disease) ([place])   . Depression   . Edema   . Essential hypertension, benign   . GERD (gastroesophageal reflux disease)   . Hypothyroidism   . Morbid obesity ([place])   . Sleep apnea    CPAP - not consistent  . Thyroid cancer ([place])   . Type 2 diabetes mellitus ([place])     Past Surgical History:  Procedure Laterality Date  . "pump bumps"     bilateral heels--2000  . ABDOMINAL HYSTERECTOMY  1985  . CARDIAC CATHETERIZATION  2012   Dr. Lia Foyer told her nothing was wrong.  [name] [place]  . CHOLECYSTECTOMY  1996  . COLONOSCOPY WITH PROPOFOL N/A 04/17/2015   Procedure: COLONOSCOPY WITH PROPOFOL  at cecum at 0810; withdrawal time =15 minutes;  Surgeon: [name], MD;  [facility];  Service: Endoscopy;  Laterality: N/A;  . Debridement of abdominal wound  2011  . ESOPHAGEAL  DILATION N/A 04/17/2015   Procedure: ESOPHAGEAL DILATION WITH 56FR MALONEY DILATOR;  Surgeon: [name], MD;  [facility];  Service: Endoscopy;  Laterality: N/A;  . ESOPHAGOGASTRODUODENOSCOPY (EGD) WITH PROPOFOL N/A 04/17/2015   Procedure: ESOPHAGOGASTRODUODENOSCOPY (EGD) WITH PROPOFOL;  Surgeon: [name], MD;  [facility]: [facility];  Service: Endoscopy;  Laterality: N/A;  . EXPLORATORY LAPAROTOMY  2003  . FOOT SURGERY Bilateral 2000   Bone spur removed  . INCISIONAL HERNIA REPAIR N/A 06/09/2014   Procedure: RECURRENT  INCISIONAL HERNIORRHAPHY WITH MESH;  Surgeon: [name], MD;  Location: [facility];  Service: General;  Laterality: N/A;  . INCISIONAL HERNIA REPAIR N/A 10/15/2014   Procedure: [place];  Surgeon: [name], MD;  Location: [place];  Service: General;  Laterality: N/A;  . INSERTION OF MESH N/A 06/09/2014   Procedure: INSERTION OF MESH;  Surgeon: [name], MD;  [facility];  Service: General;  Laterality: N/A;  . INSERTION OF MESH N/A 10/15/2014   Procedure: INSERTION OF MESH;  Surgeon: [name], MD;  Location: [place];  Service: General;  Laterality: N/A;  . POLYPECTOMY  04/17/2015   Procedure: POLYPECTOMY;  Surgeon: [name], MD;  [facility];  Service: Endoscopy;;  .  RESECTION DISTAL CLAVICAL  05/07/2012   Procedure: RESECTION DISTAL CLAVICAL;  Surgeon: [name], MD;  [facility];  Service: Orthopedics;  Laterality: Right;  . SHOULDER OPEN ROTATOR CUFF REPAIR  05/07/2012   Procedure: ROTATOR CUFF REPAIR SHOULDER OPEN;  Surgeon: [name], MD;  [facility];  Service: Orthopedics;  Laterality: Right;  . THYROIDECTOMY  2009  . TONSILLECTOMY  1958  . UMBILICAL HERNIA REPAIR  2010    There were no vitals filed for this visit.  Subjective Assessment - 11/23/18 1408    Subjective  Pt stated she doesn't feel good, weak and some lightheadedness at entrance today.  No reports of  pain currently    Pertinent History  history significant of anemia, anxiety/depression, osteoarthritis, asthma, chronic atrial fibrillation, nonalcoholic cirrhosis of the liver, COPD, erythema/lymphedema, essential hypertension, GERD, hypothyroidism/thyroidectomy/history of thyroid cancer, morbid obesity, sleep apnea not on CPAP, type 2 diabetes mellitus     Patient Stated Goals  to be able to walk better    Currently in Pain?  No/denies                       [facility] Adult PT Treatment/Exercise - 11/23/18 0001      Knee/Hip Exercises: Standing   Heel Raises  10 reps    Heel Raises Limitations  toe raises    Hip Abduction  Both;10 reps;Knee straight    Hip Extension  Both;10 reps    Functional Squat  10 reps    Functional Squat Limitations  minisquat infront of chair    SLS  1x 10" wiht 1 finger A      Knee/Hip Exercises: Seated   Sit to Sand  10 reps;without UE support   eccentric control     Knee/Hip Exercises: Sidelying   Clams  2x 10 with RTB             PT Education - 11/23/18 1416    Education Details  Reviewed goals, assured compliance with HEP, added RTB to exercises and STS to HEP.      Person(s) Educated  Patient    Methods  Explanation    Comprehension  Verbalized understanding;Returned demonstration       PT Short Term Goals - 11/21/18 1515      PT SHORT TERM GOAL #1   Title  Patient will be independent with HEP to improve functional strength for improved endurance and mobility.     Time  2    Period  Weeks    Status  New    Target Date  12/05/18      PT SHORT TERM GOAL #2   Title  Patient will demonstrate improved activity tolerance by increasin gait velocity in 2MWT by 0.1 m/s with LRAD.    Time  3    Period  Weeks    Status  New    Target Date  12/12/18      PT SHORT TERM GOAL #3   Title  Patient will verablize understanding of safety improvement when using quad cane or SPC for mobility to reduce fall risk.    Time  3    Period   Weeks    Status  New    Target Date  12/12/18        PT Long Term Goals - 11/21/18 1516      PT LONG TERM GOAL #1   Title  Patient will ambulate for 3MWT with no rest breaks using  LRAD and manitaining gait velocity of 0.65 m/s or greater to indicate improved activity tolerance.     Time  6    Period  Weeks    Status  New    Target Date  01/02/19      PT LONG TERM GOAL #2   Title  Patient will perform 5x sit to stand in = or < 12 seconds with no UE use from standard height chair to indicate improved functional LE strength.    Time  6    Period  Weeks    Status  New    Target Date  01/02/19      PT LONG TERM GOAL #3   Title  Patient will demonstrate ability to perform SLS for 10 seconds on bil LE to indicate improved balance in order to reduce risk of falling.     Time  6    Period  Weeks    Target Date  01/02/19            Plan - 11/23/18 1542    Clinical Impression Statement  Reviewed goals and assured compliance and proper form with HEP.  Added RTB resistance for strengthening with clam.  Session focus on gluteal strengthening with open and closed chain exercises.  Moderate cueing for proper form with minisquats with chair behind.  Pt limited by fatigue with exercises, theraupetic rest breaks provided during session for recovery time.  EOS no reports of pain, was limited by fatigue.  Monitored vitals through sessin with HR range from 94-100 bpm.  Pt given STS printout to add to HEP.      Personal Factors and Comorbidities  Age;Comorbidity 3+;Fitness    Comorbidities  DM, COPD, HTN    Examination-Activity Limitations  Continence;Stairs;Locomotion Level;Squat;Lift    Examination-Participation Restrictions  Cleaning;Community Activity    Stability/Clinical Decision Making  Stable/Uncomplicated    Rehab Potential  Fair    PT Frequency  2x / week    PT Duration  6 weeks    PT Treatment/Interventions  ADLs/Self Care Home Management;Aquatic Therapy;Cryotherapy;Electrical  Stimulation;Moist Heat;Gait training;Stair training;Functional mobility training;DME Instruction;Therapeutic activities;Therapeutic exercise;Balance training;Neuromuscular re-education;Patient/family education;Manual techniques    PT Next Visit Plan  Next session begin sidelying abd.  Focus on hip strengthening (pt uncomfortable in prone and bridges hurt her back) try standig hip extension. Monitor HR as pt is tachy at rest (95-101).     PT Home Exercise Plan  Eval : clamshell; 11/23/18: RTB for clams and STS without HHA       Patient will benefit from skilled therapeutic intervention in order to improve the following deficits and impairments:  Abnormal gait, Decreased balance, Decreased mobility, Decreased skin integrity, Difficulty walking, Obesity, Decreased strength, Decreased activity tolerance, Decreased safety awareness, Decreased knowledge of use of DME, Increased edema, Improper body mechanics  Visit Diagnosis: Muscle weakness (generalized)  Other abnormalities of gait and mobility     Problem List Patient Active Problem List   Diagnosis Date Noted  . Cellulitis left lower extremity.  11/11/2018  . Sepsis due to undetermined organism ([place]) 11/07/2018  . Atrial fibrillation ([place]) 11/07/2018  . COPD (chronic obstructive pulmonary disease) ([place]) 11/07/2018  . GERD (gastroesophageal reflux disease) 11/07/2018  . Hypothyroidism 11/07/2018  . Sleep apnea 11/07/2018  . Hepatic cirrhosis ([place]) 09/01/2014  . Cirrhosis, nonalcoholic ([place]) 18/29/9371  . Incisional hernia 06/09/2014  . S/P complete repair of rotator cuff 07/24/2012  . Precordial pain 09/30/2010  . Essential hypertension, benign 09/30/2010  . Type 2 diabetes mellitus (  [place]) 09/05/2008  . OBESITY-MORBID (>100') 09/05/2008  . ESOPHAGEAL REFLUX 09/05/2008  . EDEMA 09/05/2008   Ihor Austin, [place]; [place]  Aldona Lento 11/23/2018, 3:48 PM  [facility] [address] [place], [place], [address] Phone: [phone]   Fax:  [phone]  Name: AVONELL LENIG MRN: [id number] Date of Birth: [birth year]

## 2018-11-27 ENCOUNTER — Other Ambulatory Visit: Payer: Self-pay

## 2018-11-27 ENCOUNTER — Ambulatory Visit (HOSPITAL_COMMUNITY): Payer: PPO | Admitting: Physical Therapy

## 2018-11-27 DIAGNOSIS — R2689 Other abnormalities of gait and mobility: Secondary | ICD-10-CM

## 2018-11-27 DIAGNOSIS — M6281 Muscle weakness (generalized): Secondary | ICD-10-CM | POA: Diagnosis not present

## 2018-11-27 NOTE — Therapy (Signed)
[place], [place], [address] Phone: [phone]   Fax:  [phone]  Physical Therapy Treatment  Patient Details  Name: [name] [name] MRN: [id number] Date of Birth: 1955/11/08 Referring Provider (PT): Arrien, Jimmy Picket, MD   Encounter Date: 11/27/2018  PT End of Session - 11/27/18 1625    Visit Number  3    Number of Visits  12    Date for PT Re-Evaluation  01/02/19    Authorization Type  [employer] (no auth, no visit limit, $10 co-pay)    Authorization Time Period  11/21/18-01/02/19    PT Start Time  1320    PT Stop Time  1410    PT Time Calculation (min)  50 min    Activity Tolerance  Patient tolerated treatment well;Patient limited by fatigue;Other (comment)   pt with high heart rate at rest, monitored through session.  Limited by fatigue wiht activity   Behavior During Therapy  [facility] for tasks assessed/performed       Past Medical History:  Diagnosis Date  . Anemia   . Anxiety   . Arthritis   . Asthma   . Atrial fibrillation ([place])   . Cirrhosis of liver ([place])   . COPD (chronic obstructive pulmonary disease) ([place])   . Depression   . Edema   . Essential hypertension, benign   . GERD (gastroesophageal reflux disease)   . Hypothyroidism   . Morbid obesity ([place])   . Sleep apnea    CPAP - not consistent  . Thyroid cancer ([place])   . Type 2 diabetes mellitus ([place])     Past Surgical History:  Procedure Laterality Date  . "pump bumps"     bilateral heels--2000  . ABDOMINAL HYSTERECTOMY  1985  . CARDIAC CATHETERIZATION  2012   Dr. Lia Foyer told her nothing was wrong.  [name] [place]  . CHOLECYSTECTOMY  1996  . COLONOSCOPY WITH PROPOFOL N/A 04/17/2015   Procedure: COLONOSCOPY WITH PROPOFOL  at cecum at 0810; withdrawal time =15 minutes;  Surgeon: [name], MD;  [facility];  Service: Endoscopy;  Laterality: N/A;  . Debridement of abdominal wound  2011  . ESOPHAGEAL  DILATION N/A 04/17/2015   Procedure: ESOPHAGEAL DILATION WITH 56FR MALONEY DILATOR;  Surgeon: [name], MD;  [facility];  Service: Endoscopy;  Laterality: N/A;  . ESOPHAGOGASTRODUODENOSCOPY (EGD) WITH PROPOFOL N/A 04/17/2015   Procedure: ESOPHAGOGASTRODUODENOSCOPY (EGD) WITH PROPOFOL;  Surgeon: [name], MD;  [facility];  Service: Endoscopy;  Laterality: N/A;  . EXPLORATORY LAPAROTOMY  2003  . FOOT SURGERY Bilateral 2000   Bone spur removed  . INCISIONAL HERNIA REPAIR N/A 06/09/2014   Procedure: RECURRENT  INCISIONAL HERNIORRHAPHY WITH MESH;  Surgeon: [name], MD;  [facility];  Service: General;  Laterality: N/A;  . INCISIONAL HERNIA REPAIR N/A 10/15/2014   Procedure: [place];  Surgeon: [name], MD;  Location: [place];  Service: General;  Laterality: N/A;  . INSERTION OF MESH N/A 06/09/2014   Procedure: INSERTION OF MESH;  Surgeon: [name], MD;  [facility];  Service: General;  Laterality: N/A;  . INSERTION OF MESH N/A 10/15/2014   Procedure: INSERTION OF MESH;  Surgeon: [name], MD;  Location: [place];  Service: General;  Laterality: N/A;  . POLYPECTOMY  04/17/2015   Procedure: POLYPECTOMY;  Surgeon: [name], MD;  [facility];  Service: Endoscopy;;  .  RESECTION DISTAL CLAVICAL  05/07/2012   Procedure: RESECTION DISTAL CLAVICAL;  Surgeon: [name], MD;  [facility];  Service: Orthopedics;  Laterality: Right;  . SHOULDER OPEN ROTATOR CUFF REPAIR  05/07/2012   Procedure: ROTATOR CUFF REPAIR SHOULDER OPEN;  Surgeon: [name], MD;  [facility];  Service: Orthopedics;  Laterality: Right;  . THYROIDECTOMY  2009  . TONSILLECTOMY  1958  . UMBILICAL HERNIA REPAIR  2010    There were no vitals filed for this visit.  Subjective Assessment - 11/27/18 1332    Subjective  pt states she's not feeling as bad as she did, just a little weak but not lightheaded or hurting  today.     Currently in Pain?  No/denies                       [facility] Adult PT Treatment/Exercise - 11/27/18 0001      Knee/Hip Exercises: Standing   Heel Raises  15 reps    Heel Raises Limitations  toe raises 15 reps    Hip Abduction  Both;Knee straight;15 reps    Hip Extension  Both;15 reps    Functional Squat  15 reps    Functional Squat Limitations  minisquat infront of chair    SLS  1x 10" wiht 1 finger A   only 2" on Lt  0" on Rt without UE assist   SLS with Vectors  5X3" each LE with 1 UE       Knee/Hip Exercises: Seated   Sit to Sand  10 reps;without UE support      Knee/Hip Exercises: Supine   Bridges  Left;2 sets;10 reps    Bridges Limitations  min hip rise, LE extended more of glute squeeze    Straight Leg Raises  Both;2 sets;10 reps      Knee/Hip Exercises: Sidelying   Hip ABduction  Both;2 sets;10 reps    Clams  2x 10 with RTB             PT Education - 11/27/18 1624    Education Details  educated on lymphedema and encouraged to wear her compression    Person(s) Educated  Patient    Methods  Explanation    Comprehension  Verbalized understanding       PT Short Term Goals - 11/21/18 1515      PT SHORT TERM GOAL #1   Title  Patient will be independent with HEP to improve functional strength for improved endurance and mobility.     Time  2    Period  Weeks    Status  New    Target Date  12/05/18      PT SHORT TERM GOAL #2   Title  Patient will demonstrate improved activity tolerance by increasin gait velocity in 2MWT by 0.1 m/s with LRAD.    Time  3    Period  Weeks    Status  New    Target Date  12/12/18      PT SHORT TERM GOAL #3   Title  Patient will verablize understanding of safety improvement when using quad cane or SPC for mobility to reduce fall risk.    Time  3    Period  Weeks    Status  New    Target Date  12/12/18        PT Long Term Goals - 11/21/18 1516      PT LONG TERM GOAL #1  Title  Patient will  ambulate for 3MWT with no rest breaks using LRAD and manitaining gait velocity of 0.65 m/s or greater to indicate improved activity tolerance.     Time  6    Period  Weeks    Status  New    Target Date  01/02/19      PT LONG TERM GOAL #2   Title  Patient will perform 5x sit to stand in = or < 12 seconds with no UE use from standard height chair to indicate improved functional LE strength.    Time  6    Period  Weeks    Status  New    Target Date  01/02/19      PT LONG TERM GOAL #3   Title  Patient will demonstrate ability to perform SLS for 10 seconds on bil LE to indicate improved balance in order to reduce risk of falling.     Time  6    Period  Weeks    Target Date  01/02/19            Plan - 11/27/18 1632    Clinical Impression Statement  continued with strengthening for gluteal and LE mm.  Pt requires cues for general form and to complete therex with increased control.  Pt required a few rest breaks during session today due to general fatigue.    Added sidelying hip abduction with tactile cues for form for correct mm recruitment.  Also added bridging with minimal rise, more of glute squeeze and SLR's.  Pt without any c/o pain.  Discussed pt's LE lymphedema and importance of wearing compression, moisturizing and not scratching her LE.  Pt given information on Compression outlet in [place].  Pt verbalized understanding.     Personal Factors and Comorbidities  Age;Comorbidity 3+;Fitness    Comorbidities  DM, COPD, HTN    Examination-Activity Limitations  Continence;Stairs;Locomotion Level;Squat;Lift    Examination-Participation Restrictions  Cleaning;Community Activity    Stability/Clinical Decision Making  Stable/Uncomplicated    Rehab Potential  Fair    PT Frequency  2x / week    PT Duration  6 weeks    PT Treatment/Interventions  ADLs/Self Care Home Management;Aquatic Therapy;Cryotherapy;Electrical Stimulation;Moist Heat;Gait training;Stair training;Functional mobility  training;DME Instruction;Therapeutic activities;Therapeutic exercise;Balance training;Neuromuscular re-education;Patient/family education;Manual techniques    PT Next Visit Plan  Continue with Focus on hip strengthening Monitor HR if needed (tendency for Vtach)     PT Home Exercise Plan  Eval : clamshell; 11/23/18: RTB for clams and STS without HHA       Patient will benefit from skilled therapeutic intervention in order to improve the following deficits and impairments:  Abnormal gait, Decreased balance, Decreased mobility, Decreased skin integrity, Difficulty walking, Obesity, Decreased strength, Decreased activity tolerance, Decreased safety awareness, Decreased knowledge of use of DME, Increased edema, Improper body mechanics  Visit Diagnosis: Muscle weakness (generalized)  Other abnormalities of gait and mobility     Problem List Patient Active Problem List   Diagnosis Date Noted  . Cellulitis left lower extremity.  11/11/2018  . Sepsis due to undetermined organism ([place]) 11/07/2018  . Atrial fibrillation ([place]) 11/07/2018  . COPD (chronic obstructive pulmonary disease) ([place]) 11/07/2018  . GERD (gastroesophageal reflux disease) 11/07/2018  . Hypothyroidism 11/07/2018  . Sleep apnea 11/07/2018  . Hepatic cirrhosis ([place]) 09/01/2014  . Cirrhosis, nonalcoholic ([place]) 32/67/1245  . Incisional hernia 06/09/2014  . S/P complete repair of rotator cuff 07/24/2012  . Precordial pain 09/30/2010  . Essential hypertension, benign 09/30/2010  .  Type 2 diabetes mellitus ([place]) 09/05/2008  . OBESITY-MORBID (>100') 09/05/2008  . ESOPHAGEAL REFLUX 09/05/2008  . EDEMA 09/05/2008   Teena Irani, PTA/CLT [phone]  Teena Irani 11/27/2018, 4:40 PM  [facility] [address] [place], [place], [address] Phone: [phone]   Fax:  [phone]  Name: [name] [name] MRN: [id number] Date of Birth: 10-29-1955

## 2018-11-29 DIAGNOSIS — F321 Major depressive disorder, single episode, moderate: Secondary | ICD-10-CM | POA: Diagnosis not present

## 2018-11-29 DIAGNOSIS — I10 Essential (primary) hypertension: Secondary | ICD-10-CM | POA: Diagnosis not present

## 2018-11-29 DIAGNOSIS — Z6841 Body Mass Index (BMI) 40.0 and over, adult: Secondary | ICD-10-CM | POA: Diagnosis not present

## 2018-11-29 DIAGNOSIS — F329 Major depressive disorder, single episode, unspecified: Secondary | ICD-10-CM | POA: Diagnosis not present

## 2018-11-29 DIAGNOSIS — K660 Peritoneal adhesions (postprocedural) (postinfection): Secondary | ICD-10-CM | POA: Diagnosis not present

## 2018-11-29 DIAGNOSIS — E1140 Type 2 diabetes mellitus with diabetic neuropathy, unspecified: Secondary | ICD-10-CM | POA: Diagnosis not present

## 2018-11-29 DIAGNOSIS — E611 Iron deficiency: Secondary | ICD-10-CM | POA: Diagnosis not present

## 2018-11-29 DIAGNOSIS — J45909 Unspecified asthma, uncomplicated: Secondary | ICD-10-CM | POA: Diagnosis not present

## 2018-11-29 DIAGNOSIS — R5383 Other fatigue: Secondary | ICD-10-CM | POA: Diagnosis not present

## 2018-11-29 DIAGNOSIS — E538 Deficiency of other specified B group vitamins: Secondary | ICD-10-CM | POA: Diagnosis not present

## 2018-11-29 DIAGNOSIS — M1991 Primary osteoarthritis, unspecified site: Secondary | ICD-10-CM | POA: Diagnosis not present

## 2018-11-29 DIAGNOSIS — E063 Autoimmune thyroiditis: Secondary | ICD-10-CM | POA: Diagnosis not present

## 2018-11-29 DIAGNOSIS — D649 Anemia, unspecified: Secondary | ICD-10-CM | POA: Diagnosis not present

## 2018-11-29 DIAGNOSIS — E669 Obesity, unspecified: Secondary | ICD-10-CM | POA: Diagnosis not present

## 2018-11-30 ENCOUNTER — Ambulatory Visit (HOSPITAL_COMMUNITY): Payer: PPO

## 2018-12-04 ENCOUNTER — Other Ambulatory Visit: Payer: Self-pay

## 2018-12-04 ENCOUNTER — Emergency Department (HOSPITAL_COMMUNITY): Payer: PPO

## 2018-12-04 ENCOUNTER — Telehealth (HOSPITAL_COMMUNITY): Payer: Self-pay | Admitting: Internal Medicine

## 2018-12-04 ENCOUNTER — Encounter (HOSPITAL_COMMUNITY): Payer: Self-pay

## 2018-12-04 ENCOUNTER — Ambulatory Visit (HOSPITAL_COMMUNITY): Payer: PPO | Admitting: Physical Therapy

## 2018-12-04 ENCOUNTER — Emergency Department (HOSPITAL_COMMUNITY)
Admission: EM | Admit: 2018-12-04 | Discharge: 2018-12-04 | Disposition: A | Discharge status: Home / Self Care | Payer: PPO | Attending: Emergency Medicine | Admitting: Emergency Medicine

## 2018-12-04 DIAGNOSIS — Y999 Unspecified external cause status: Secondary | ICD-10-CM | POA: Diagnosis not present

## 2018-12-04 DIAGNOSIS — S40022A Contusion of left upper arm, initial encounter: Secondary | ICD-10-CM | POA: Diagnosis not present

## 2018-12-04 DIAGNOSIS — S299XXA Unspecified injury of thorax, initial encounter: Secondary | ICD-10-CM | POA: Diagnosis not present

## 2018-12-04 DIAGNOSIS — M79602 Pain in left arm: Secondary | ICD-10-CM | POA: Diagnosis not present

## 2018-12-04 DIAGNOSIS — Z79899 Other long term (current) drug therapy: Secondary | ICD-10-CM | POA: Insufficient documentation

## 2018-12-04 DIAGNOSIS — S0101XA Laceration without foreign body of scalp, initial encounter: Secondary | ICD-10-CM

## 2018-12-04 DIAGNOSIS — S199XXA Unspecified injury of neck, initial encounter: Secondary | ICD-10-CM | POA: Diagnosis not present

## 2018-12-04 DIAGNOSIS — J449 Chronic obstructive pulmonary disease, unspecified: Secondary | ICD-10-CM | POA: Insufficient documentation

## 2018-12-04 DIAGNOSIS — Y92512 Supermarket, store or market as the place of occurrence of the external cause: Secondary | ICD-10-CM | POA: Insufficient documentation

## 2018-12-04 DIAGNOSIS — Y939 Activity, unspecified: Secondary | ICD-10-CM | POA: Insufficient documentation

## 2018-12-04 DIAGNOSIS — S40012A Contusion of left shoulder, initial encounter: Secondary | ICD-10-CM | POA: Diagnosis not present

## 2018-12-04 DIAGNOSIS — Z794 Long term (current) use of insulin: Secondary | ICD-10-CM | POA: Insufficient documentation

## 2018-12-04 DIAGNOSIS — I4891 Unspecified atrial fibrillation: Secondary | ICD-10-CM | POA: Diagnosis not present

## 2018-12-04 DIAGNOSIS — I10 Essential (primary) hypertension: Secondary | ICD-10-CM | POA: Diagnosis not present

## 2018-12-04 DIAGNOSIS — E039 Hypothyroidism, unspecified: Secondary | ICD-10-CM | POA: Insufficient documentation

## 2018-12-04 DIAGNOSIS — S0003XA Contusion of scalp, initial encounter: Secondary | ICD-10-CM | POA: Diagnosis not present

## 2018-12-04 DIAGNOSIS — R52 Pain, unspecified: Secondary | ICD-10-CM | POA: Diagnosis not present

## 2018-12-04 DIAGNOSIS — W19XXXA Unspecified fall, initial encounter: Secondary | ICD-10-CM

## 2018-12-04 DIAGNOSIS — R58 Hemorrhage, not elsewhere classified: Secondary | ICD-10-CM | POA: Diagnosis not present

## 2018-12-04 DIAGNOSIS — S4992XA Unspecified injury of left shoulder and upper arm, initial encounter: Secondary | ICD-10-CM | POA: Diagnosis not present

## 2018-12-04 DIAGNOSIS — E119 Type 2 diabetes mellitus without complications: Secondary | ICD-10-CM | POA: Diagnosis not present

## 2018-12-04 HISTORY — DX: Overactive bladder: N32.81

## 2018-12-04 MED ORDER — METHOCARBAMOL 500 MG PO TABS
500.0000 mg | ORAL_TABLET | Freq: Once | ORAL | Status: AC
  Administered 2018-12-04: 500 mg via ORAL
  Filled 2018-12-04: qty 1

## 2018-12-04 MED ORDER — ONDANSETRON HCL 4 MG PO TABS
4.0000 mg | ORAL_TABLET | Freq: Once | ORAL | Status: AC
  Administered 2018-12-04: 13:00:00 4 mg via ORAL
  Filled 2018-12-04: qty 1

## 2018-12-04 MED ORDER — HYDROCODONE-ACETAMINOPHEN 5-325 MG PO TABS
2.0000 | ORAL_TABLET | Freq: Once | ORAL | Status: AC
  Administered 2018-12-04: 2 via ORAL
  Filled 2018-12-04: qty 2

## 2018-12-04 MED ORDER — HYDROCODONE-ACETAMINOPHEN 5-325 MG PO TABS: 1.0000 | ORAL_TABLET | ORAL | 0 refills | Status: DC | PRN

## 2018-12-04 NOTE — ED Provider Notes (Signed)
[facility] EMERGENCY DEPARTMENT Provider Note   CSN: [id number] Arrival date & time: 12/04/18  1146     History   Chief Complaint Chief Complaint  Patient presents with  . Fall    HPI [name] [name] is a 63 y.o. female.     Patient is a 63 year old female who presents to the emergency department after a fall.  The patient states that few minutes ago she was at local pharmacy store.  She fell and sustained an injury to the scalp.  She also injured the left shoulder.  The patient states that she is on Eliquis. No loss of consciousness reported.  No difficulty with breathing noted.  Patient denies any pelvis pain.  There is no extremity pain with the exception of the left shoulder.  Patient has not taken any medication for her discomfort up to this point.  The history is provided by the patient.    Past Medical History:  Diagnosis Date  . Anemia   . Anxiety   . Arthritis   . Asthma   . Atrial fibrillation ([place])   . Cirrhosis of liver ([place])   . COPD (chronic obstructive pulmonary disease) ([place])   . Depression   . Edema   . Essential hypertension, benign   . GERD (gastroesophageal reflux disease)   . Hypothyroidism   . Morbid obesity ([place])   . Overactive bladder   . Sleep apnea    CPAP - not consistent  . Thyroid cancer ([place])   . Type 2 diabetes mellitus [facility])     Patient Active Problem List   Diagnosis Date Noted  . Cellulitis left lower extremity.  11/11/2018  . Sepsis due to undetermined organism ([place]) 11/07/2018  . Atrial fibrillation ([place]) 11/07/2018  . COPD (chronic obstructive pulmonary disease) ([place]) 11/07/2018  . GERD (gastroesophageal reflux disease) 11/07/2018  . Hypothyroidism 11/07/2018  . Sleep apnea 11/07/2018  . Hepatic cirrhosis ([place]) 09/01/2014  . Cirrhosis, nonalcoholic ([place]) 35/59/7416  . Incisional hernia 06/09/2014  . S/P complete repair of rotator cuff 07/24/2012  . Precordial pain 09/30/2010  . Essential hypertension, benign  09/30/2010  . Type 2 diabetes mellitus ([place]) 09/05/2008  . OBESITY-MORBID (>100') 09/05/2008  . ESOPHAGEAL REFLUX 09/05/2008  . EDEMA 09/05/2008    Past Surgical History:  Procedure Laterality Date  . "pump bumps"     bilateral heels--2000  . ABDOMINAL HYSTERECTOMY  1985  . CARDIAC CATHETERIZATION  2012   Dr. Lia Foyer told her nothing was wrong.  [name] [place]  . CHOLECYSTECTOMY  1996  . COLONOSCOPY WITH PROPOFOL N/A 04/17/2015   Procedure: COLONOSCOPY WITH PROPOFOL  at cecum at 0810; withdrawal time =15 minutes;  Surgeon: [name], MD;  Location: [facility];  Service: Endoscopy;  Laterality: N/A;  . Debridement of abdominal wound  2011  . ESOPHAGEAL DILATION N/A 04/17/2015   Procedure: ESOPHAGEAL DILATION WITH 56FR MALONEY DILATOR;  Surgeon: [name], MD;  Location: [facility];  Service: Endoscopy;  Laterality: N/A;  . ESOPHAGOGASTRODUODENOSCOPY (EGD) WITH PROPOFOL N/A 04/17/2015   Procedure: ESOPHAGOGASTRODUODENOSCOPY (EGD) WITH PROPOFOL;  Surgeon: [name], MD;  Location: [facility];  Service: Endoscopy;  Laterality: N/A;  . EXPLORATORY LAPAROTOMY  2003  . FOOT SURGERY Bilateral 2000   Bone spur removed  . FOOT SURGERY    . INCISIONAL HERNIA REPAIR N/A 06/09/2014   Procedure: RECURRENT  INCISIONAL HERNIORRHAPHY WITH MESH;  Surgeon: [name], MD;  Location: [facility];  Service: General;  Laterality: N/A;  .  INCISIONAL HERNIA REPAIR N/A 10/15/2014   Procedure: LAPAROSCOPIC INCISIONAL HERNIA REPAIR;  Surgeon: [name], MD;  Location: [place];  Service: General;  Laterality: N/A;  . INSERTION OF MESH N/A 06/09/2014   Procedure: INSERTION OF MESH;  Surgeon: [name], MD;  Location: [facility];  Service: General;  Laterality: N/A;  . INSERTION OF MESH N/A 10/15/2014   Procedure: INSERTION OF MESH;  Surgeon: [name], MD;  Location: [place];  Service: General;  Laterality: N/A;  . POLYPECTOMY  04/17/2015   Procedure: POLYPECTOMY;  Surgeon: [name], MD;  Location: [facility];  Service: Endoscopy;;  . RESECTION DISTAL CLAVICAL  05/07/2012   Procedure: RESECTION DISTAL CLAVICAL;  Surgeon: [name], MD;  Location: [facility];  Service: Orthopedics;  Laterality: Right;  . SHOULDER OPEN ROTATOR CUFF REPAIR  05/07/2012   Procedure: ROTATOR CUFF REPAIR SHOULDER OPEN;  Surgeon: [name], MD;  Location: [facility];  Service: Orthopedics;  Laterality: Right;  . THYROIDECTOMY  2009  . TONSILLECTOMY  1958  . UMBILICAL HERNIA REPAIR  2010     OB History   No obstetric history on file.      Home Medications    Prior to Admission medications   Medication Sig Start Date End Date Taking? Authorizing Provider  albuterol (PROVENTIL HFA) 108 (90 BASE) MCG/ACT inhaler Inhale 2 puffs into the lungs every 6 (six) hours as needed for wheezing or shortness of breath. Shortness of breath    [provider]  apixaban (ELIQUIS) 5 MG TABS tablet Take 1 tablet (5 mg total) by mouth 2 (two) times daily. 11/19/18   Satira Sark, MD  colesevelam [facility]) 625 MG tablet Take 1,875 mg by mouth as directed. 3 tabs am 3 tabs pm    [provider]  diltiazem (CARDIZEM CD) 360 MG 24 hr capsule Take 1 capsule (360 mg total) by mouth daily. 03/27/18   Satira Sark, MD  DULoxetine (CYMBALTA) 30 MG capsule Take 30 mg by mouth daily.  01/18/17   [provider]  DULoxetine (CYMBALTA) 60 MG capsule Take 60 mg by mouth daily.  03/20/15   [provider]  econazole nitrate 1 % cream Apply topically daily. Patient taking differently: Apply 1 application topically daily as needed (for rash irritation).  12/09/15   Tuchman, Richard C, DPM  furosemide (LASIX) 20 MG tablet Take 20 mg by mouth daily as needed for fluid.     [provider]  gabapentin (NEURONTIN) 300 MG capsule Take 300 mg by mouth daily. 10/19/18   [provider]  HYDROcodone-acetaminophen (NORCO) 10-325 MG tablet Take 1 tablet by mouth every  hour as needed for moderate pain.     [provider]  hydrOXYzine (ATARAX/VISTARIL) 25 MG tablet Take 25 mg by mouth 4 (four) times daily as needed. 10/12/18   [provider]  LANTUS SOLOSTAR 100 UNIT/ML Solostar Pen Inject 44 Units into the skin.  09/21/18   [provider]  levothyroxine (SYNTHROID) 200 MCG tablet Take 200 mcg by mouth daily before breakfast. Take in addition to 75 mcg for a total of 275 mcg daily    [provider]  levothyroxine (SYNTHROID) 75 MCG tablet Take 50-75 mcg by mouth daily before breakfast. DOSE NEEDS TO BE VERIFIED!    [provider]  losartan (COZAAR) 100 MG tablet Take 100 mg by mouth daily.  04/27/18   [provider]  meloxicam (MOBIC) 15 MG tablet Take 15 mg  by mouth daily.  10/22/15   [provider]  metFORMIN (GLUCOPHAGE) 1000 MG tablet Take 1,000 mg by mouth 2 (two) times daily with a meal.    [provider]  nystatin (NYSTOP) 100000 UNIT/GM POWD Apply 100,000 g topically daily as needed (for rash-irritation). Rash 08/18/10   [provider]  nystatin cream (MYCOSTATIN) Apply 1 application topically 3 (three) times daily.  03/23/17   [provider]  potassium chloride SA (K-DUR,KLOR-CON) 20 MEQ tablet Take 20 mEq by mouth 2 (two) times daily.  04/27/18   [provider]  pravastatin (PRAVACHOL) 20 MG tablet Take 20 mg by mouth daily. 10/30/18   [provider]  SYNTHROID 200 MCG tablet Take 1 tablet by mouth daily. DOSE NEEDS TO BE VERIFIED 11/16/17   [provider]  tolterodine (DETROL LA) 4 MG 24 hr capsule Take 4 mg by mouth 2 (two) times a day. 08/17/18   [provider]  traZODone (DESYREL) 100 MG tablet Take 100-200 mg by mouth at bedtime. 09/21/18   [provider]    Family History Family History  Problem Relation Age of Onset  . Heart disease Other   . Arthritis Other   . Cancer Other   . Asthma Other   . Diabetes  Other   . Kidney disease Other     Social History Social History   Tobacco Use  . Smoking status: Never Smoker  . Smokeless tobacco: Never Used  Substance Use Topics  . Alcohol use: No    Alcohol/week: 0.0 standard drinks  . Drug use: No     Allergies   Doxycycline, Adhesive [tape], Biaxin [clarithromycin], Percocet [oxycodone-acetaminophen], Xarelto [rivaroxaban], Clarithromycin, Clindamycin/lincomycin, Neosporin [neomycin-polymyxin b gu], and Penicillins   Review of Systems Review of Systems  Constitutional: Negative for activity change.       All ROS Neg except as noted in HPI  HENT: Negative.   Eyes: Negative for photophobia and discharge.  Respiratory: Negative for cough, shortness of breath and wheezing.   Cardiovascular: Negative for chest pain and palpitations.  Gastrointestinal: Negative for abdominal pain and blood in stool.  Genitourinary: Negative for dysuria, frequency and hematuria.  Musculoskeletal: Positive for arthralgias. Negative for back pain and neck pain.  Skin: Negative.   Neurological: Negative for dizziness, seizures and speech difficulty.  Psychiatric/Behavioral: Negative for confusion and hallucinations.     Physical Exam Updated Vital Signs BP 132/66   Pulse 87 Comment: Simultaneous filing. User may not have seen previous data.  Temp 97.7 F (36.5 C) (Oral)   Resp 12   Ht 5' 4"  (1.626 m)   Wt 132 kg   SpO2 99% Comment: Simultaneous filing. User may not have seen previous data.  BMI 49.95 kg/m   Physical Exam Vitals signs and nursing note reviewed.  Constitutional:      General: She is not in acute distress.    Appearance: She is well-developed. She is not toxic-appearing.  HENT:     Head: Normocephalic. Laceration present. No Battle's sign, right periorbital erythema or left periorbital erythema.      Right Ear: Tympanic membrane and external ear normal.     Left Ear: Tympanic membrane and external ear normal.  Eyes:      General: Lids are normal. No scleral icterus.       Right eye: No discharge.        Left eye: No discharge.     Conjunctiva/sclera: Conjunctivae normal.     Pupils: Pupils are  equal, round, and reactive to light.  Neck:     Musculoskeletal: Normal range of motion and neck supple.     Vascular: No carotid bruit.     Trachea: No tracheal deviation.  Cardiovascular:     Rate and Rhythm: Normal rate and regular rhythm.     Pulses: Normal pulses.     Heart sounds: Normal heart sounds.  Pulmonary:     Effort: Pulmonary effort is normal. No respiratory distress.     Breath sounds: Normal breath sounds. No stridor. No wheezing or rales.  Abdominal:     General: Bowel sounds are normal. There is no distension.     Palpations: Abdomen is soft.     Tenderness: There is no abdominal tenderness. There is no guarding or rebound.  Musculoskeletal:        General: No tenderness.     Left shoulder: She exhibits decreased range of motion and pain.       Arms:  Lymphadenopathy:     Head:     Right side of head: No submandibular adenopathy.     Left side of head: No submandibular adenopathy.     Cervical: No cervical adenopathy.  Skin:    General: Skin is warm and dry.     Findings: No rash.  Neurological:     Mental Status: She is alert and oriented to person, place, and time.     Cranial Nerves: No cranial nerve deficit.     Sensory: No sensory deficit.     Motor: No abnormal muscle tone or seizure activity.     Coordination: Coordination normal.  Psychiatric:        Speech: Speech normal.      ED Treatments / Results  Labs (all labs ordered are listed, but only abnormal results are displayed) Labs Reviewed - No data to display  EKG    Radiology No results found.  Procedures .Marland KitchenLaceration Repair  Date/Time: 12/04/2018 3:39 PM Performed by: Lily Kocher, PA-C Authorized by: Lily Kocher, PA-C   Consent:    Consent obtained:  Verbal   Consent given by:  Patient   Risks  discussed:  Infection, poor cosmetic result and poor wound healing Universal protocol:    Procedure explained and questions answered to patient or proxy's satisfaction: yes     Immediately prior to procedure, a time out was called: yes     Patient identity confirmed:  Arm band Anesthesia (see MAR for exact dosages):    Anesthesia method:  None Laceration details:    Location:  Scalp   Scalp location:  Occipital   Length (cm):  1.3 Repair type:    Repair type:  Simple Pre-procedure details:    Preparation:  Patient was prepped and draped in usual sterile fashion Exploration:    Hemostasis achieved with:  Direct pressure   Wound extent: no underlying fracture noted   Treatment:    Area cleansed with:  Saline   Amount of cleaning:  Standard Skin repair:    Repair method:  Tissue adhesive Approximation:    Approximation:  Loose Post-procedure details:    Dressing:  Open (no dressing)   Patient tolerance of procedure:  Tolerated well, no immediate complications   (including critical care time)  Medications Ordered in ED Medications - No data to display   Initial Impression / Assessment and Plan / ED Course  I have reviewed the triage vital signs and the nursing notes.  Pertinent labs & imaging results that were available during  my care of the patient were reviewed by me and considered in my medical decision making (see chart for details).          Final Clinical Impressions(s) / ED Diagnoses MDM  Vital signs reviewed.  Blood pressure slightly elevated.  Pulse oximetry is 99% on room air.  Within normal limits by my interpretation.  Patient is awake and alert oriented.  No acute neurologic deficits appreciated. Case discussed with Dr [name].  Patient given medication for assistance with soreness.  There was a delay in obtaining imaging studies due to high volume and high acuity in the emergency department.  I discussed this with the patient.  CT scan of the head  shows no acute intracranial findings for skull fracture.  There is a scalp hematoma noted.  The CT scan of the cervical spine shows normal alignment.  There is no acute cervical spine fracture. X-ray of the left humerus shows the shoulder and elbow joints are grossly maintained.  There is no fracture of the humerus identified. X-ray of the chest shows no active disease.  The laceration to the scalp was repaired with Dermabond.  Recheck.  No gross neurologic deficits appreciated.  The patient remains awake and alert talkative and in no acute distress.  I have given the patient medication to use for pain and discomfort.  I have asked the patient and given her strict instructions to return immediately if any vision changes, difficulty with swallowing or speaking, changes in her balance, changes in her strength of upper or lower extremities, problems, or concerns.  The patient knowledges understanding of these instructions.  She has someone at home who can observe her for any changes and bring her to the emergency department or call [phone] if any complications or problems.   Final diagnoses:  None    ED Discharge Orders         Ordered    HYDROcodone-acetaminophen (NORCO/VICODIN) 5-325 MG tablet  Every 4 hours PRN     12/04/18 1631           Lily Kocher, PA-C 12/04/18 2208    Fredia Sorrow, MD 12/05/18 1655

## 2018-12-04 NOTE — ED Triage Notes (Signed)
Pt fell 5-10 mins ago on her left side at [facility] and has a gash to left head. Not actively bleeding. Complaining of left shoulder pain. Did NOT lose consciousness.

## 2018-12-04 NOTE — Discharge Instructions (Signed)
The CT of your head and neck are negative for acute problem. The xray of your shoulder, arm and chest are negative. Please use tylenol with each meal and bedtime for the next 3 days. Use Norrco for more severe pain. Return if any changes in your condition, unusual dizziness, headache, change in speech, change in walking, or problems or concerrns.

## 2018-12-04 NOTE — Telephone Encounter (Signed)
12/04/18  ER nurse at [facility] called to say patient was there due to a fall and wouldn't be at her appt.

## 2018-12-05 DIAGNOSIS — S82001A Unspecified fracture of right patella, initial encounter for closed fracture: Secondary | ICD-10-CM | POA: Diagnosis not present

## 2018-12-07 ENCOUNTER — Other Ambulatory Visit: Payer: Self-pay

## 2018-12-07 ENCOUNTER — Ambulatory Visit (HOSPITAL_COMMUNITY): Payer: PPO

## 2018-12-07 ENCOUNTER — Encounter (HOSPITAL_COMMUNITY): Payer: Self-pay

## 2018-12-07 DIAGNOSIS — M6281 Muscle weakness (generalized): Secondary | ICD-10-CM

## 2018-12-07 DIAGNOSIS — R2689 Other abnormalities of gait and mobility: Secondary | ICD-10-CM

## 2018-12-07 NOTE — Therapy (Signed)
[place], [place], [address] Phone: [phone]   Fax:  [phone]  Physical Therapy Treatment  Patient Details  Name: [name] [name] MRN: [id number] Date of Birth: 28-Jan-1956 Referring Provider (PT): Arrien, Jimmy Picket, MD   Encounter Date: 12/07/2018  PT End of Session - 12/07/18 1440    Visit Number  4    Number of Visits  12    Date for PT Re-Evaluation  01/02/19    Authorization Type  [employer] (no auth, no visit limit, $10 co-pay)    Authorization Time Period  11/21/18-01/02/19    PT Start Time  1420    PT Stop Time  1502    PT Time Calculation (min)  42 min    Equipment Utilized During Treatment  Gait belt    Activity Tolerance  Patient tolerated treatment well;Patient limited by fatigue    Behavior During Therapy  [facility] for tasks assessed/performed       Past Medical History:  Diagnosis Date  . Anemia   . Anxiety   . Arthritis   . Asthma   . Atrial fibrillation ([place])   . Cirrhosis of liver ([place])   . COPD (chronic obstructive pulmonary disease) ([place])   . Depression   . Edema   . Essential hypertension, benign   . GERD (gastroesophageal reflux disease)   . Hypothyroidism   . Morbid obesity ([place])   . Overactive bladder   . Sleep apnea    CPAP - not consistent  . Thyroid cancer ([place])   . Type 2 diabetes mellitus ([place])     Past Surgical History:  Procedure Laterality Date  . "pump bumps"     bilateral heels--2000  . ABDOMINAL HYSTERECTOMY  1985  . CARDIAC CATHETERIZATION  2012   Dr. Lia Foyer told her nothing was wrong.  [name] [place]  . CHOLECYSTECTOMY  1996  . COLONOSCOPY WITH PROPOFOL N/A 04/17/2015   Procedure: COLONOSCOPY WITH PROPOFOL  at cecum at 0810; withdrawal time =15 minutes;  Surgeon: [name], MD;  [facility];  Service: Endoscopy;  Laterality: N/A;  . Debridement of abdominal wound  2011  . ESOPHAGEAL DILATION N/A 04/17/2015   Procedure: ESOPHAGEAL DILATION WITH 56FR MALONEY DILATOR;  Surgeon: [name], MD;  [facility];  Service: Endoscopy;  Laterality: N/A;  . ESOPHAGOGASTRODUODENOSCOPY (EGD) WITH PROPOFOL N/A 04/17/2015   Procedure: ESOPHAGOGASTRODUODENOSCOPY (EGD) WITH PROPOFOL;  Surgeon: [name], MD;  [facility]: [facility];  Service: Endoscopy;  Laterality: N/A;  . EXPLORATORY LAPAROTOMY  2003  . FOOT SURGERY Bilateral 2000   Bone spur removed  . FOOT SURGERY    . INCISIONAL HERNIA REPAIR N/A 06/09/2014   Procedure: RECURRENT  INCISIONAL HERNIORRHAPHY WITH MESH;  Surgeon: [name], MD;  Location: [facility];  Service: General;  Laterality: N/A;  . INCISIONAL HERNIA REPAIR N/A 10/15/2014   Procedure: [place];  Surgeon: [name], MD;  Location: [place];  Service: General;  Laterality: N/A;  . INSERTION OF MESH N/A 06/09/2014   Procedure: INSERTION OF MESH;  Surgeon: [name], MD;  [facility];  Service: General;  Laterality: N/A;  . INSERTION OF MESH N/A 10/15/2014   Procedure: INSERTION OF MESH;  Surgeon: [name], MD;  Location: [place];  Service: General;  Laterality: N/A;  . POLYPECTOMY  04/17/2015   Procedure: POLYPECTOMY;  Surgeon: [name], MD;  [facility];  Service: Endoscopy;;  .  RESECTION DISTAL CLAVICAL  05/07/2012   Procedure: RESECTION DISTAL CLAVICAL;  Surgeon: [name], MD;  [facility]: [facility];  Service: Orthopedics;  Laterality: Right;  . SHOULDER OPEN ROTATOR CUFF REPAIR  05/07/2012   Procedure: ROTATOR CUFF REPAIR SHOULDER OPEN;  Surgeon: [name], MD;  [facility];  Service: Orthopedics;  Laterality: Right;  . THYROIDECTOMY  2009  . TONSILLECTOMY  1958  . UMBILICAL HERNIA REPAIR  2010    There were no vitals filed for this visit.  Subjective Assessment - 12/07/18 1422    Subjective  Patietn reports she fell at [facility] on 12/04/18 when filling out paperwork for a rollator. She  states she is not sure what she fell over but she landed on her Left side and hit her head against a lift chair. She reports her left ribs and shoulder are still realyl sore but they told her that she did not have any fractures. She reports it is really hard for her to get up from the cahir in her living room and her granddaughter helps her up. She is planning to get risers to go under the chair legs to make it taller.    Pertinent History  history significant of anemia, anxiety/depression, osteoarthritis, asthma, chronic atrial fibrillation, nonalcoholic cirrhosis of the liver, COPD, erythema/lymphedema, essential hypertension, GERD, hypothyroidism/thyroidectomy/history of thyroid cancer, morbid obesity, sleep apnea not on CPAP, type 2 diabetes mellitus     Limitations  Standing;Walking;House hold activities    Patient Stated Goals  to be able to walk better    Currently in Pain?  Yes    Pain Score  9     Pain Location  Other (Comment)   along whole side of left body   Pain Orientation  Left    Pain Descriptors / Indicators  Sore    Pain Type  Acute pain    Pain Onset  In the past 7 days    Pain Frequency  Constant        [facility] Adult PT Treatment/Exercise - 12/07/18 0001      Exercises   Exercises  Knee/Hip      Knee/Hip Exercises: Stretches   Other Knee/Hip Stretches  Standing lumbar extension: 10x 10 second holds      Knee/Hip Exercises: Aerobic   Nustep  4 minutes on level 1, Bil UE/LE, 30 sec rest break at 2:30      Knee/Hip Exercises: Seated   Sit to Sand  1 set;10 reps;without UE support      Knee/Hip Exercises: Supine   Bridges  2 sets;10 reps;Both    Bridges Limitations  improved hip elevation, cues for couting out loud to facilitate breathing      Knee/Hip Exercises: Sidelying   Clams  2x 15 reps bil LE       PT Education - 12/07/18 1440    Education Details  Educated on exercise throughout and on treatment purpose to address weakness and balance deficits.     Person(s) Educated  Patient    Methods  Explanation    Comprehension  Verbalized understanding;Returned demonstration       PT Short Term Goals - 11/21/18 1515      PT SHORT TERM GOAL #1   Title  Patient will be independent with HEP to improve functional strength for improved endurance and mobility.     Time  2    Period  Weeks    Status  New    Target Date  12/05/18  PT SHORT TERM GOAL #2   Title  Patient will demonstrate improved activity tolerance by increasin gait velocity in 2MWT by 0.1 m/s with LRAD.    Time  3    Period  Weeks    Status  New    Target Date  12/12/18      PT SHORT TERM GOAL #3   Title  Patient will verablize understanding of safety improvement when using quad cane or SPC for mobility to reduce fall risk.    Time  3    Period  Weeks    Status  New    Target Date  12/12/18        PT Long Term Goals - 11/21/18 1516      PT LONG TERM GOAL #1   Title  Patient will ambulate for 3MWT with no rest breaks using LRAD and manitaining gait velocity of 0.65 m/s or greater to indicate improved activity tolerance.     Time  6    Period  Weeks    Status  New    Target Date  01/02/19      PT LONG TERM GOAL #2   Title  Patient will perform 5x sit to stand in = or < 12 seconds with no UE use from standard height chair to indicate improved functional LE strength.    Time  6    Period  Weeks    Status  New    Target Date  01/02/19      PT LONG TERM GOAL #3   Title  Patient will demonstrate ability to perform SLS for 10 seconds on bil LE to indicate improved balance in order to reduce risk of falling.     Time  6    Period  Weeks    Target Date  01/02/19         Plan - 12/07/18 1441    Clinical Impression Statement  Ms. Desmith arrived to physical therapy reporting she is still very sore from a fall she had on 12/04/18. She is unsure how the fall happened but she did hit her head and went to the ED where they determined no acute fractures had occurred.  She denies feeling dizzy or light headed at start of session and is more limited by Lt sided pain; vitals assessed at start of session: 70 bpm, 97% SpO2, 154/80 mmHg. She was able to complete supine and side-lying hip strengthening today and was educated on lumbar stretch as she complained of low back pain and reported some relief with stretch. At EOS patient performed NuStep for aerobic exercise. Her HR increassd to 117 bpm during therapy and pt rested until HR came down to 107 bpm before leaving clinic. She will continue to benefit from skilled PT interventions to address impairments and reduce risk of falls.    Personal Factors and Comorbidities  Age;Comorbidity 3+;Fitness    Comorbidities  DM, COPD, HTN    Examination-Activity Limitations  Continence;Stairs;Locomotion Level;Squat;Lift    Examination-Participation Restrictions  Cleaning;Community Activity    Stability/Clinical Decision Making  Stable/Uncomplicated    Rehab Potential  Fair    PT Frequency  2x / week    PT Duration  6 weeks    PT Treatment/Interventions  ADLs/Self Care Home Management;Aquatic Therapy;Cryotherapy;Electrical Stimulation;Moist Heat;Gait training;Stair training;Functional mobility training;DME Instruction;Therapeutic activities;Therapeutic exercise;Balance training;Neuromuscular re-education;Patient/family education;Manual techniques    PT Next Visit Plan  Continue with Focus on hip strengthening Monitor HR if needed (tendency for Vtach). Perform balance interventions and nustep.  PT Home Exercise Plan  Eval : clamshell; 11/23/18: RTB for clams and STS without HHA    Consulted and Agree with Plan of Care  Patient       Patient will benefit from skilled therapeutic intervention in order to improve the following deficits and impairments:  Abnormal gait, Decreased balance, Decreased mobility, Decreased skin integrity, Difficulty walking, Obesity, Decreased strength, Decreased activity tolerance, Decreased safety awareness,  Decreased knowledge of use of DME, Increased edema, Improper body mechanics  Visit Diagnosis: 1. Muscle weakness (generalized)   2. Other abnormalities of gait and mobility        Problem List Patient Active Problem List   Diagnosis Date Noted  . Cellulitis left lower extremity.  11/11/2018  . Sepsis due to undetermined organism ([place]) 11/07/2018  . Atrial fibrillation ([place]) 11/07/2018  . COPD (chronic obstructive pulmonary disease) ([place]) 11/07/2018  . GERD (gastroesophageal reflux disease) 11/07/2018  . Hypothyroidism 11/07/2018  . Sleep apnea 11/07/2018  . Hepatic cirrhosis ([place]) 09/01/2014  . Cirrhosis, nonalcoholic ([place]) 12/81/1886  . Incisional hernia 06/09/2014  . S/P complete repair of rotator cuff 07/24/2012  . Precordial pain 09/30/2010  . Essential hypertension, benign 09/30/2010  . Type 2 diabetes mellitus ([place]) 09/05/2008  . OBESITY-MORBID (>100') 09/05/2008  . ESOPHAGEAL REFLUX 09/05/2008  . EDEMA 09/05/2008    Kipp Brood, PT, DPT, [facility] Physical Therapist with [facility]  12/07/2018 3:13 PM    [place], [place], [address] Phone: [phone]   Fax:  [phone]  Name: KAYRA CROWELL MRN: [id number] Date of Birth: 1955/07/17

## 2018-12-11 ENCOUNTER — Encounter (HOSPITAL_COMMUNITY): Payer: Self-pay

## 2018-12-11 ENCOUNTER — Other Ambulatory Visit: Payer: Self-pay

## 2018-12-11 ENCOUNTER — Ambulatory Visit (HOSPITAL_COMMUNITY): Payer: PPO

## 2018-12-11 DIAGNOSIS — R2689 Other abnormalities of gait and mobility: Secondary | ICD-10-CM

## 2018-12-11 DIAGNOSIS — M6281 Muscle weakness (generalized): Secondary | ICD-10-CM | POA: Diagnosis not present

## 2018-12-11 NOTE — Therapy (Signed)
[place], [place], [address] Phone: [phone]   Fax:  [phone]  Physical Therapy Treatment  Patient Details  Name: [name] [name] MRN: [id number] Date of Birth: 1956-06-06 Referring Provider (PT): Arrien, Jimmy Picket, MD   Encounter Date: 12/11/2018  PT End of Session - 12/11/18 1406    Visit Number  5    Number of Visits  12    Date for PT Re-Evaluation  01/02/19    Authorization Type  [employer] (no auth, no visit limit, $10 co-pay)    Authorization Time Period  11/21/18-01/02/19    PT Start Time  1400    PT Stop Time  1442    PT Time Calculation (min)  42 min    Equipment Utilized During Treatment  Gait belt    Activity Tolerance  Patient tolerated treatment well;Patient limited by fatigue    Behavior During Therapy  [facility] for tasks assessed/performed       Past Medical History:  Diagnosis Date  . Anemia   . Anxiety   . Arthritis   . Asthma   . Atrial fibrillation ([place])   . Cirrhosis of liver ([place])   . COPD (chronic obstructive pulmonary disease) ([place])   . Depression   . Edema   . Essential hypertension, benign   . GERD (gastroesophageal reflux disease)   . Hypothyroidism   . Morbid obesity ([place])   . Overactive bladder   . Sleep apnea    CPAP - not consistent  . Thyroid cancer ([place])   . Type 2 diabetes mellitus ([place])     Past Surgical History:  Procedure Laterality Date  . "pump bumps"     bilateral heels--2000  . ABDOMINAL HYSTERECTOMY  1985  . CARDIAC CATHETERIZATION  2012   Dr. Lia Foyer told her nothing was wrong.  [name] [place]  . CHOLECYSTECTOMY  1996  . COLONOSCOPY WITH PROPOFOL N/A 04/17/2015   Procedure: COLONOSCOPY WITH PROPOFOL  at cecum at 0810; withdrawal time =15 minutes;  Surgeon: [name], MD;  [facility];  Service: Endoscopy;  Laterality: N/A;  . Debridement of abdominal wound  2011  . ESOPHAGEAL DILATION N/A 04/17/2015    Procedure: ESOPHAGEAL DILATION WITH 56FR MALONEY DILATOR;  Surgeon: [name], MD;  [facility];  Service: Endoscopy;  Laterality: N/A;  . ESOPHAGOGASTRODUODENOSCOPY (EGD) WITH PROPOFOL N/A 04/17/2015   Procedure: ESOPHAGOGASTRODUODENOSCOPY (EGD) WITH PROPOFOL;  Surgeon: [name], MD;  [facility];  Service: Endoscopy;  Laterality: N/A;  . EXPLORATORY LAPAROTOMY  2003  . FOOT SURGERY Bilateral 2000   Bone spur removed  . FOOT SURGERY    . INCISIONAL HERNIA REPAIR N/A 06/09/2014   Procedure: RECURRENT  INCISIONAL HERNIORRHAPHY WITH MESH;  Surgeon: [name], MD;  [facility];  Service: General;  Laterality: N/A;  . INCISIONAL HERNIA REPAIR N/A 10/15/2014   Procedure: [place];  Surgeon: [name], MD;  Location: [place];  Service: General;  Laterality: N/A;  . INSERTION OF MESH N/A 06/09/2014   Procedure: INSERTION OF MESH;  Surgeon: [name], MD;  [facility];  Service: General;  Laterality: N/A;  . INSERTION OF MESH N/A 10/15/2014   Procedure: INSERTION OF MESH;  Surgeon: [name], MD;  Location: [place];  Service: General;  Laterality: N/A;  . POLYPECTOMY  04/17/2015   Procedure: POLYPECTOMY;  Surgeon: [name], MD;  [facility];  Service:  Endoscopy;;  . RESECTION DISTAL CLAVICAL  05/07/2012   Procedure: RESECTION DISTAL CLAVICAL;  Surgeon: [name], MD;  [facility];  Service: Orthopedics;  Laterality: Right;  . SHOULDER OPEN ROTATOR CUFF REPAIR  05/07/2012   Procedure: ROTATOR CUFF REPAIR SHOULDER OPEN;  Surgeon: [name], MD;  [facility];  Service: Orthopedics;  Laterality: Right;  . THYROIDECTOMY  2009  . TONSILLECTOMY  1958  . UMBILICAL HERNIA REPAIR  2010    There were no vitals filed for this visit.  Subjective Assessment - 12/11/18 1400    Subjective  Pt reports she is still having soreness in her L shoulder. Pt reports she raised her chair with blocks  and she no longer needs her granddaughter's assistance to get out of the chair.    Pertinent History  history significant of anemia, anxiety/depression, osteoarthritis, asthma, chronic atrial fibrillation, nonalcoholic cirrhosis of the liver, COPD, erythema/lymphedema, essential hypertension, GERD, hypothyroidism/thyroidectomy/history of thyroid cancer, morbid obesity, sleep apnea not on CPAP, type 2 diabetes mellitus     Limitations  Standing;Walking;House hold activities    Patient Stated Goals  to be able to walk better    Currently in Pain?  No/denies    Pain Onset  In the past 7 days          [facility] Adult PT Treatment/Exercise - 12/11/18 0001      Knee/Hip Exercises: Aerobic   Nustep  4 minutes, L2, Bil LE maintaining >90 spm for cardio training      Knee/Hip Exercises: Standing   Heel Raises  2 sets;15 reps    Hip Abduction  Both;15 reps    Abduction Limitations  UE support    Hip Extension  Both;15 reps    Extension Limitations  UE support    Forward Step Up  Both;10 reps;Step Height: 4"    Forward Step Up Limitations  UE support    Functional Squat  10 reps    Functional Squat Limitations  minisquat infront of chair    SLS  2 sec or < BLE in parallel bars, no UE assist      Knee/Hip Exercises: Seated   Sit to Sand  2 sets;10 reps;without UE support             PT Education - 12/11/18 1405    Education Details  Exercise technique, updated HEP, encouraged and educated pt on benefits of increasing activity level at home    Person(s) Educated  Patient    Methods  Explanation;Demonstration    Comprehension  Verbalized understanding;Returned demonstration       PT Short Term Goals - 11/21/18 1515      PT SHORT TERM GOAL #1   Title  Patient will be independent with HEP to improve functional strength for improved endurance and mobility.     Time  2    Period  Weeks    Status  New    Target Date  12/05/18      PT SHORT TERM GOAL #2   Title  Patient will  demonstrate improved activity tolerance by increasin gait velocity in 2MWT by 0.1 m/s with LRAD.    Time  3    Period  Weeks    Status  New    Target Date  12/12/18      PT SHORT TERM GOAL #3   Title  Patient will verablize understanding of safety improvement when using quad cane or SPC for mobility to reduce fall risk.  Time  3    Period  Weeks    Status  New    Target Date  12/12/18        PT Long Term Goals - 11/21/18 1516      PT LONG TERM GOAL #1   Title  Patient will ambulate for 3MWT with no rest breaks using LRAD and manitaining gait velocity of 0.65 m/s or greater to indicate improved activity tolerance.     Time  6    Period  Weeks    Status  New    Target Date  01/02/19      PT LONG TERM GOAL #2   Title  Patient will perform 5x sit to stand in = or < 12 seconds with no UE use from standard height chair to indicate improved functional LE strength.    Time  6    Period  Weeks    Status  New    Target Date  01/02/19      PT LONG TERM GOAL #3   Title  Patient will demonstrate ability to perform SLS for 10 seconds on bil LE to indicate improved balance in order to reduce risk of falling.     Time  6    Period  Weeks    Target Date  01/02/19            Plan - 12/11/18 1437    Clinical Impression Statement  Continued to progress pt with BLE strengthening exercises to improve gait mechanics and balance with functional mobility. Pt tolerates increase in aerobic training with increased Nustep level and verbal cues to maintain >90 steps/min to promote endurance training with reduced weight-bearing position to minimize discomfort on R knee. Progressed pt with forward step ups and increase in STS reps without complaints, requiring verbal cues for mechanics. PT provides verbal cues for straight knee with standing hip exercises and upright posture with step ups to engage glute mms. Pt continues to be limited with SLS demonstrating 2 sec or < with standing inside parallel  bars before needing to hold on to bars or bringing foot down. Pt demonstrates moderate shortness of breath with exercise performance, but HR remains 100-110bpm and able to continue conversation. Added minisquats to HEP and educated pt on increasing activity at home, even if that is walking around on the inside of the house 1x per hour to slowly increase activity level. Pt without complaints at EOS. Continue to progress as able.    Personal Factors and Comorbidities  Age;Comorbidity 3+;Fitness    Comorbidities  DM, COPD, HTN    Examination-Activity Limitations  Continence;Stairs;Locomotion Level;Squat;Lift    Examination-Participation Restrictions  Cleaning;Community Activity    Stability/Clinical Decision Making  Stable/Uncomplicated    Rehab Potential  Fair    PT Frequency  2x / week    PT Duration  6 weeks    PT Treatment/Interventions  ADLs/Self Care Home Management;Aquatic Therapy;Cryotherapy;Electrical Stimulation;Moist Heat;Gait training;Stair training;Functional mobility training;DME Instruction;Therapeutic activities;Therapeutic exercise;Balance training;Neuromuscular re-education;Patient/family education;Manual techniques    PT Next Visit Plan  Continue with focus on hip and quad strengthening. Progress balance exercises to reduce risk for falls and NuStep training for cardiovascular endurance training. Monitor HR if needed (tendency for Vtach).    PT Home Exercise Plan  Eval : clamshell; 11/23/18: RTB for clams and STS without HHA; 6/23: minisquat with chair behind    Consulted and Agree with Plan of Care  Patient       Patient will benefit from skilled therapeutic intervention  in order to improve the following deficits and impairments:  Abnormal gait, Decreased balance, Decreased mobility, Decreased skin integrity, Difficulty walking, Obesity, Decreased strength, Decreased activity tolerance, Decreased safety awareness, Decreased knowledge of use of DME, Increased edema, Improper body  mechanics  Visit Diagnosis: 1. Muscle weakness (generalized)   2. Other abnormalities of gait and mobility        Problem List Patient Active Problem List   Diagnosis Date Noted  . Cellulitis left lower extremity.  11/11/2018  . Sepsis due to undetermined organism ([place]) 11/07/2018  . Atrial fibrillation ([place]) 11/07/2018  . COPD (chronic obstructive pulmonary disease) ([place]) 11/07/2018  . GERD (gastroesophageal reflux disease) 11/07/2018  . Hypothyroidism 11/07/2018  . Sleep apnea 11/07/2018  . Hepatic cirrhosis ([place]) 09/01/2014  . Cirrhosis, nonalcoholic ([place]) 30/03/4044  . Incisional hernia 06/09/2014  . S/P complete repair of rotator cuff 07/24/2012  . Precordial pain 09/30/2010  . Essential hypertension, benign 09/30/2010  . Type 2 diabetes mellitus ([place]) 09/05/2008  . OBESITY-MORBID (>100') 09/05/2008  . ESOPHAGEAL REFLUX 09/05/2008  . EDEMA 09/05/2008    2:57 PM, 12/11/18 Talbot Grumbling, DPT Physical Therapist with [facility] [phone] office  [place] [address] [place], [place], [address] Phone: [phone]   Fax:  [phone]  Name: [name] [name] MRN: [id number] Date of Birth: May 13, 1956

## 2018-12-13 ENCOUNTER — Encounter (HOSPITAL_COMMUNITY): Payer: Self-pay

## 2018-12-13 ENCOUNTER — Ambulatory Visit (HOSPITAL_COMMUNITY): Payer: PPO

## 2018-12-13 ENCOUNTER — Other Ambulatory Visit: Payer: Self-pay

## 2018-12-13 DIAGNOSIS — M6281 Muscle weakness (generalized): Secondary | ICD-10-CM | POA: Diagnosis not present

## 2018-12-13 DIAGNOSIS — R2689 Other abnormalities of gait and mobility: Secondary | ICD-10-CM

## 2018-12-13 NOTE — Therapy (Signed)
[name], [place], [address] Phone: [phone]   Fax:  [phone]  Physical Therapy Treatment  Patient Details  Name: [name] [name] MRN: [id number] Date of Birth: 12-07-1955 Referring Provider (PT): Arrien, Jimmy Picket, MD   Encounter Date: 12/13/2018  PT End of Session - 12/13/18 1336    Visit Number  6    Number of Visits  12    Date for PT Re-Evaluation  01/02/19    Authorization Type  [employer] (no auth, no visit limit, $10 co-pay)    Authorization Time Period  11/21/18-01/02/19    PT Start Time  1320    PT Stop Time  1402    PT Time Calculation (min)  42 min    Equipment Utilized During Treatment  Gait belt    Activity Tolerance  Patient tolerated treatment well;Patient limited by fatigue    Behavior During Therapy  [facility] for tasks assessed/performed       Past Medical History:  Diagnosis Date  . Anemia   . Anxiety   . Arthritis   . Asthma   . Atrial fibrillation ([place])   . Cirrhosis of liver ([place])   . COPD (chronic obstructive pulmonary disease) ([place])   . Depression   . Edema   . Essential hypertension, benign   . GERD (gastroesophageal reflux disease)   . Hypothyroidism   . Morbid obesity ([place])   . Overactive bladder   . Sleep apnea    CPAP - not consistent  . Thyroid cancer ([place])   . Type 2 diabetes mellitus ([place])     Past Surgical History:  Procedure Laterality Date  . "pump bumps"     bilateral heels--2000  . ABDOMINAL HYSTERECTOMY  1985  . CARDIAC CATHETERIZATION  2012   Dr. Lia Foyer told her nothing was wrong.  [name] [place]  . CHOLECYSTECTOMY  1996  . COLONOSCOPY WITH PROPOFOL N/A 04/17/2015   Procedure: COLONOSCOPY WITH PROPOFOL  at cecum at 0810; withdrawal time =15 minutes;  Surgeon: [name], MD;  [facility];  Service: Endoscopy;  Laterality: N/A;  . Debridement of abdominal wound  2011  . ESOPHAGEAL DILATION N/A 04/17/2015   Procedure: ESOPHAGEAL DILATION WITH 56FR MALONEY DILATOR;  Surgeon: [name], MD;  Location: [facility];  Service: Endoscopy;  Laterality: N/A;  . ESOPHAGOGASTRODUODENOSCOPY (EGD) WITH PROPOFOL N/A 04/17/2015   Procedure: ESOPHAGOGASTRODUODENOSCOPY (EGD) WITH PROPOFOL;  Surgeon: [name], MD;  Location: [facility];  Service: Endoscopy;  Laterality: N/A;  . EXPLORATORY LAPAROTOMY  2003  . FOOT SURGERY Bilateral 2000   Bone spur removed  . FOOT SURGERY    . INCISIONAL HERNIA REPAIR N/A 06/09/2014   Procedure: RECURRENT  INCISIONAL HERNIORRHAPHY WITH MESH;  Surgeon: [name], MD;  Location: [facility];  Service: General;  Laterality: N/A;  . INCISIONAL HERNIA REPAIR N/A 10/15/2014   Procedure: [place];  Surgeon: [name], MD;  Location: [place];  Service: General;  Laterality: N/A;  . INSERTION OF MESH N/A 06/09/2014   Procedure: INSERTION OF MESH;  Surgeon: [name], MD;  Location[facility];  Service: General;  Laterality: N/A;  . INSERTION OF MESH N/A 10/15/2014   Procedure: INSERTION OF MESH;  Surgeon: [name], MD;  Location: [place];  Service: General;  Laterality: N/A;  . POLYPECTOMY  04/17/2015   Procedure: POLYPECTOMY;  Surgeon: [name], MD;  Location: [facility];  Service: Endoscopy;;  .  RESECTION DISTAL CLAVICAL  05/07/2012   Procedure: RESECTION DISTAL CLAVICAL;  Surgeon: [name], MD;  Location: [facility];  Service: Orthopedics;  Laterality: Right;  . SHOULDER OPEN ROTATOR CUFF REPAIR  05/07/2012   Procedure: ROTATOR CUFF REPAIR SHOULDER OPEN;  Surgeon: [name], MD;  Location[facility];  Service: Orthopedics;  Laterality: Right;  . THYROIDECTOMY  2009  . TONSILLECTOMY  1958  . UMBILICAL HERNIA REPAIR  2010    There were no vitals filed for this visit.  Subjective Assessment - 12/13/18 1324    Subjective  Patient reports her Rt knee is giving her pain today and her Lt shoulder is still hurting. She thinks she  may go back to the doctor to check if there is something else wrong with her shoulder. She isn;t sure the x-ray was done to really show if there was an injury.    Pertinent History  history significant of anemia, anxiety/depression, osteoarthritis, asthma, chronic atrial fibrillation, nonalcoholic cirrhosis of the liver, COPD, erythema/lymphedema, essential hypertension, GERD, hypothyroidism/thyroidectomy/history of thyroid cancer, morbid obesity, sleep apnea not on CPAP, type 2 diabetes mellitus     Limitations  Standing;Walking;House hold activities    Patient Stated Goals  to be able to walk better    Currently in Pain?  No/denies    Pain Onset  In the past 7 days        [facility] Adult PT Treatment/Exercise - 12/13/18 0001      Knee/Hip Exercises: Aerobic   Nustep  6 minutes total on Hill profile 2 (level 3), 3x 2 minutes on (1 minute rest break between), maintained SPM of 90-100+      Knee/Hip Exercises: Standing   Heel Raises  Both;1 set;15 reps;3 seconds;Limitations   on incline   Heel Raises Limitations  1x 15 reps toe raises, 3 sec holds, on decline    Hip Abduction  Both;15 reps    Abduction Limitations  UE support    Hip Extension  Both;15 reps    Extension Limitations  UE support    Lateral Step Up  Both;1 set;10 reps;Hand Hold: 2;Step Height: 4"    Lateral Step Up Limitations  cues required to deter Rt hip ER    Forward Step Up  Both;1 set;10 reps;Hand Hold: 2;Step Height: 4"      Knee/Hip Exercises: Seated   Sit to Sand  2 sets;10 reps;without UE support       Balance Exercises - 12/13/18 1349      Balance Exercises: Standing   Tandem Stance  Eyes open;Foam/compliant surface;Intermittent upper extremity support;2 reps;20 secs   2x each way (rt fot back, Lt foot back)   Marching Limitations  10 reps Bil LE on foam, march with 1 UE support in // bars, 5 sec holds        PT Education - 12/13/18 1356    Education Details  Educated on exercises throughout and cues to  deter use of UE's during balance training.    Person(s) Educated  Patient    Methods  Explanation    Comprehension  Verbalized understanding;Returned demonstration       PT Short Term Goals - 11/21/18 1515      PT SHORT TERM GOAL #1   Title  Patient will be independent with HEP to improve functional strength for improved endurance and mobility.     Time  2    Period  Weeks    Status  New    Target Date  12/05/18  PT SHORT TERM GOAL #2   Title  Patient will demonstrate improved activity tolerance by increasin gait velocity in 2MWT by 0.1 m/s with LRAD.    Time  3    Period  Weeks    Status  New    Target Date  12/12/18      PT SHORT TERM GOAL #3   Title  Patient will verablize understanding of safety improvement when using quad cane or SPC for mobility to reduce fall risk.    Time  3    Period  Weeks    Status  New    Target Date  12/12/18        PT Long Term Goals - 11/21/18 1516      PT LONG TERM GOAL #1   Title  Patient will ambulate for 3MWT with no rest breaks using LRAD and manitaining gait velocity of 0.65 m/s or greater to indicate improved activity tolerance.     Time  6    Period  Weeks    Status  New    Target Date  01/02/19      PT LONG TERM GOAL #2   Title  Patient will perform 5x sit to stand in = or < 12 seconds with no UE use from standard height chair to indicate improved functional LE strength.    Time  6    Period  Weeks    Status  New    Target Date  01/02/19      PT LONG TERM GOAL #3   Title  Patient will demonstrate ability to perform SLS for 10 seconds on bil LE to indicate improved balance in order to reduce risk of falling.     Time  6    Period  Weeks    Target Date  01/02/19        Plan - 12/13/18 1340    Clinical Impression Statement  Continued with current plan for functional strengthening and balance training. Patient continued with standing exercises today and progressed to lateral step ups as well as forward. She continues  to require bil UE support for step ups but was able to perform marching on foam with single UE support. She struggles with limited hip flexion and required cues to increase hip flexion throughout. NuStep was continued at EOS and patient progressed to level 3 on hill program. She was given 1 minute rest break every 2 minutes to prevent over exertion. HR remained between 87-115 during session and SpO2 at or above 965. She will continue to benefit from skilled PT interventions to address impairments and progress mobility.    Personal Factors and Comorbidities  Age;Comorbidity 3+;Fitness    Comorbidities  DM, COPD, HTN    Examination-Activity Limitations  Continence;Stairs;Locomotion Level;Squat;Lift    Examination-Participation Restrictions  Cleaning;Community Activity    Stability/Clinical Decision Making  Stable/Uncomplicated    Rehab Potential  Fair    PT Frequency  2x / week    PT Duration  6 weeks    PT Treatment/Interventions  ADLs/Self Care Home Management;Aquatic Therapy;Cryotherapy;Electrical Stimulation;Moist Heat;Gait training;Stair training;Functional mobility training;DME Instruction;Therapeutic activities;Therapeutic exercise;Balance training;Neuromuscular re-education;Patient/family education;Manual techniques    PT Next Visit Plan  Update HEP next session with balance exercise at home and follow up on home walking program. Continue with focus on hip and quad strengthening. Progress balance exercises to reduce risk for falls and NuStep training for cardiovascular endurance training. Monitor HR if needed (tendency for Vtach).    PT Home Exercise Plan  Eval : clamshell; 11/23/18: RTB for clams and STS without HHA; 6/23: minisquat with chair behind    Consulted and Agree with Plan of Care  Patient       Patient will benefit from skilled therapeutic intervention in order to improve the following deficits and impairments:  Abnormal gait, Decreased balance, Decreased mobility, Decreased skin  integrity, Difficulty walking, Obesity, Decreased strength, Decreased activity tolerance, Decreased safety awareness, Decreased knowledge of use of DME, Increased edema, Improper body mechanics  Visit Diagnosis: 1. Muscle weakness (generalized)   2. Other abnormalities of gait and mobility        Problem List Patient Active Problem List   Diagnosis Date Noted  . Cellulitis left lower extremity.  11/11/2018  . Sepsis due to undetermined organism ([place]) 11/07/2018  . Atrial fibrillation ([place]) 11/07/2018  . COPD (chronic obstructive pulmonary disease) ([place]) 11/07/2018  . GERD (gastroesophageal reflux disease) 11/07/2018  . Hypothyroidism 11/07/2018  . Sleep apnea 11/07/2018  . Hepatic cirrhosis ([place]) 09/01/2014  . Cirrhosis, nonalcoholic ([place]) 07/31/1550  . Incisional hernia 06/09/2014  . S/P complete repair of rotator cuff 07/24/2012  . Precordial pain 09/30/2010  . Essential hypertension, benign 09/30/2010  . Type 2 diabetes mellitus ([place]) 09/05/2008  . OBESITY-MORBID (>100') 09/05/2008  . ESOPHAGEAL REFLUX 09/05/2008  . EDEMA 09/05/2008    Kipp Brood, PT, DPT, [facility] Physical Therapist with [facility]  12/13/2018 1:57 PM    [place], [place], [address] Phone: [phone]   Fax:  [phone]  Name: [name] [name] MRN: [id number] Date of Birth: 01-Apr-1956

## 2018-12-14 DIAGNOSIS — Z1389 Encounter for screening for other disorder: Secondary | ICD-10-CM | POA: Diagnosis not present

## 2018-12-14 DIAGNOSIS — Z6841 Body Mass Index (BMI) 40.0 and over, adult: Secondary | ICD-10-CM | POA: Diagnosis not present

## 2018-12-14 DIAGNOSIS — S46002A Unspecified injury of muscle(s) and tendon(s) of the rotator cuff of left shoulder, initial encounter: Secondary | ICD-10-CM | POA: Diagnosis not present

## 2018-12-14 DIAGNOSIS — N3090 Cystitis, unspecified without hematuria: Secondary | ICD-10-CM | POA: Diagnosis not present

## 2018-12-18 ENCOUNTER — Ambulatory Visit (HOSPITAL_COMMUNITY): Payer: PPO | Admitting: Physical Therapy

## 2018-12-18 ENCOUNTER — Other Ambulatory Visit: Payer: Self-pay

## 2018-12-18 DIAGNOSIS — M6281 Muscle weakness (generalized): Secondary | ICD-10-CM

## 2018-12-18 DIAGNOSIS — R2689 Other abnormalities of gait and mobility: Secondary | ICD-10-CM

## 2018-12-18 NOTE — Therapy (Signed)
[name], [place], [address] Phone: [phone]   Fax:  [phone]  Physical Therapy Treatment  Patient Details  Name: [name] [name] MRN: [id number] Date of Birth: 08-01-1955 Referring Provider (PT): Arrien, Jimmy Picket, MD   Encounter Date: 12/18/2018  PT End of Session - 12/18/18 1425    Visit Number  7    Number of Visits  12    Date for PT Re-Evaluation  01/02/19    Authorization Type  [employer] (no auth, no visit limit, $10 co-pay)    Authorization Time Period  11/21/18-01/02/19    PT Start Time  1333    PT Stop Time  1420    PT Time Calculation (min)  47 min    Equipment Utilized During Treatment  Gait belt    Activity Tolerance  Patient tolerated treatment well;Patient limited by fatigue    Behavior During Therapy  [facility] for tasks assessed/performed       Past Medical History:  Diagnosis Date  . Anemia   . Anxiety   . Arthritis   . Asthma   . Atrial fibrillation ([place])   . Cirrhosis of liver ([place])   . COPD (chronic obstructive pulmonary disease) ([place])   . Depression   . Edema   . Essential hypertension, benign   . GERD (gastroesophageal reflux disease)   . Hypothyroidism   . Morbid obesity ([place])   . Overactive bladder   . Sleep apnea    CPAP - not consistent  . Thyroid cancer ([place])   . Type 2 diabetes mellitus ([place])     Past Surgical History:  Procedure Laterality Date  . "pump bumps"     bilateral heels--2000  . ABDOMINAL HYSTERECTOMY  1985  . CARDIAC CATHETERIZATION  2012   Dr. Lia Foyer told her nothing was wrong.  [name] [place]  . CHOLECYSTECTOMY  1996  . COLONOSCOPY WITH PROPOFOL N/A 04/17/2015   Procedure: COLONOSCOPY WITH PROPOFOL  at cecum at 0810; withdrawal time =15 minutes;  Surgeon: [name], MD;  [facility];  Service: Endoscopy;  Laterality: N/A;  . Debridement of abdominal wound  2011  . ESOPHAGEAL DILATION N/A 04/17/2015   Procedure: ESOPHAGEAL DILATION WITH 56FR MALONEY DILATOR;  Surgeon: [name], MD;  [facility];  Service: Endoscopy;  Laterality: N/A;  . ESOPHAGOGASTRODUODENOSCOPY (EGD) WITH PROPOFOL N/A 04/17/2015   Procedure: ESOPHAGOGASTRODUODENOSCOPY (EGD) WITH PROPOFOL;  Surgeon: [name], MD;  [facility]: [facility];  Service: Endoscopy;  Laterality: N/A;  . EXPLORATORY LAPAROTOMY  2003  . FOOT SURGERY Bilateral 2000   Bone spur removed  . FOOT SURGERY    . INCISIONAL HERNIA REPAIR N/A 06/09/2014   Procedure: RECURRENT  INCISIONAL HERNIORRHAPHY WITH MESH;  Surgeon: [name], MD;  Location: [facility];  Service: General;  Laterality: N/A;  . INCISIONAL HERNIA REPAIR N/A 10/15/2014   Procedure: [place];  Surgeon: [name], MD;  Location: [place];  Service: General;  Laterality: N/A;  . INSERTION OF MESH N/A 06/09/2014   Procedure: INSERTION OF MESH;  Surgeon: [name], MD;  [facility]: [facility];  Service: General;  Laterality: N/A;  . INSERTION OF MESH N/A 10/15/2014   Procedure: INSERTION OF MESH;  Surgeon: [name], MD;  Location: [place];  Service: General;  Laterality: N/A;  . POLYPECTOMY  04/17/2015   Procedure: POLYPECTOMY;  Surgeon: [name], MD;  Location: [facility];  Service: Endoscopy;;  .  RESECTION DISTAL CLAVICAL  05/07/2012   Procedure: RESECTION DISTAL CLAVICAL;  Surgeon: [name], MD;  Location: [facility];  Service: Orthopedics;  Laterality: Right;  . SHOULDER OPEN ROTATOR CUFF REPAIR  05/07/2012   Procedure: ROTATOR CUFF REPAIR SHOULDER OPEN;  Surgeon: [name], MD;  [facility];  Service: Orthopedics;  Laterality: Right;  . THYROIDECTOMY  2009  . TONSILLECTOMY  1958  . UMBILICAL HERNIA REPAIR  2010    There were no vitals filed for this visit.  Subjective Assessment - 12/18/18 1337    Subjective  pt states she's been at the ED the past 3 days with her grandaughter, thinking she has a bad gallbladder.   Reports her legs are swollen and itching but not having any pain.    Currently in Pain?  No/denies                       [facility] Adult PT Treatment/Exercise - 12/18/18 0001      Knee/Hip Exercises: Aerobic   Nustep  6 minutes total on Hill profile 2 (level 3), 3x 2 minutes on (1 minute rest break between), maintained SPM of 90-100+      Knee/Hip Exercises: Standing   Heel Raises  Both;1 set;15 reps;3 seconds;Limitations    Heel Raises Limitations  1x 15 reps toe raises, 3 sec holds, on decline    Hip Abduction  Both;15 reps    Abduction Limitations  UE support    Hip Extension  Both;15 reps    Extension Limitations  UE support    Lateral Step Up  Both;1 set;Hand Hold: 2;Step Height: 4";15 reps    Lateral Step Up Limitations  cues required to deter Rt hip ER    Forward Step Up  Both;1 set;Hand Hold: 2;Step Height: 4";15 reps    SLS  2 sec or < BLE in parallel bars, no UE assist    Other Standing Knee Exercises  tandem stance 30" each LE lead             PT Education - 12/18/18 1352    Education Details  Reiterated importance of acquiring compression stockings to help reduced edema and itching in LE's.  ENcouraged not to scratch as this could cause cellulitis and to moisturize LE's daily.       PT Short Term Goals - 11/21/18 1515      PT SHORT TERM GOAL #1   Title  Patient will be independent with HEP to improve functional strength for improved endurance and mobility.     Time  2    Period  Weeks    Status  New    Target Date  12/05/18      PT SHORT TERM GOAL #2   Title  Patient will demonstrate improved activity tolerance by increasin gait velocity in 2MWT by 0.1 m/s with LRAD.    Time  3    Period  Weeks    Status  New    Target Date  12/12/18      PT SHORT TERM GOAL #3   Title  Patient will verablize understanding of safety improvement when using quad cane or SPC for mobility to reduce fall risk.    Time  3    Period  Weeks    Status  New     Target Date  12/12/18        PT Long Term Goals - 11/21/18 1516      PT LONG TERM  GOAL #1   Title  Patient will ambulate for 3MWT with no rest breaks using LRAD and manitaining gait velocity of 0.65 m/s or greater to indicate improved activity tolerance.     Time  6    Period  Weeks    Status  New    Target Date  01/02/19      PT LONG TERM GOAL #2   Title  Patient will perform 5x sit to stand in = or < 12 seconds with no UE use from standard height chair to indicate improved functional LE strength.    Time  6    Period  Weeks    Status  New    Target Date  01/02/19      PT LONG TERM GOAL #3   Title  Patient will demonstrate ability to perform SLS for 10 seconds on bil LE to indicate improved balance in order to reduce risk of falling.     Time  6    Period  Weeks    Target Date  01/02/19            Plan - 12/18/18 1426    Clinical Impression Statement  Pt still has not acquired compression stockings with noted evidence of scratching LE's.  Educated pateient on dangers of cellulities and urged to order stockings (has already been given El[employer] therapy pamphlet and measurements).  Progressed static balance to include tandem stance and lunges with minimal UE assist.  Updated HEP to include static balance, heel and toe raises.  PT able to complete with postural cues and continues to need frequent rest breaks due to fatigue.  Finished session on nustep.    Personal Factors and Comorbidities  Age;Comorbidity 3+;Fitness    Comorbidities  DM, COPD, HTN    Examination-Activity Limitations  Continence;Stairs;Locomotion Level;Squat;Lift    Examination-Participation Restrictions  Cleaning;Community Activity    Stability/Clinical Decision Making  Stable/Uncomplicated    Rehab Potential  Fair    PT Frequency  2x / week    PT Duration  6 weeks    PT Treatment/Interventions  ADLs/Self Care Home Management;Aquatic Therapy;Cryotherapy;Electrical Stimulation;Moist Heat;Gait training;Stair  training;Functional mobility training;DME Instruction;Therapeutic activities;Therapeutic exercise;Balance training;Neuromuscular re-education;Patient/family education;Manual techniques    PT Next Visit Plan  Continue with focus on hip and quad strengthening. Progress balance exercises as able. Continue to educate on wearing compression stockings/moisturizing LE's and follow up on acquiring compression and walking program.    PT Home Exercise Plan  Eval : clamshell; 11/23/18: RTB for clams and STS without HHA; 6/23: minisquat with chair behind   6/30: heel raise, toe raise, tandem stance, SLS    Consulted and Agree with Plan of Care  Patient       Patient will benefit from skilled therapeutic intervention in order to improve the following deficits and impairments:  Abnormal gait, Decreased balance, Decreased mobility, Decreased skin integrity, Difficulty walking, Obesity, Decreased strength, Decreased activity tolerance, Decreased safety awareness, Decreased knowledge of use of DME, Increased edema, Improper body mechanics  Visit Diagnosis: 1. Muscle weakness (generalized)   2. Other abnormalities of gait and mobility        Problem List Patient Active Problem List   Diagnosis Date Noted  . Cellulitis left lower extremity.  11/11/2018  . Sepsis due to undetermined organism ([place]) 11/07/2018  . Atrial fibrillation ([place]) 11/07/2018  . COPD (chronic obstructive pulmonary disease) ([place]) 11/07/2018  . GERD (gastroesophageal reflux disease) 11/07/2018  . Hypothyroidism 11/07/2018  . Sleep apnea 11/07/2018  . Hepatic cirrhosis (  [place]) 09/01/2014  . Cirrhosis, nonalcoholic ([place]) 27/67/0110  . Incisional hernia 06/09/2014  . S/P complete repair of rotator cuff 07/24/2012  . Precordial pain 09/30/2010  . Essential hypertension, benign 09/30/2010  . Type 2 diabetes mellitus ([place]) 09/05/2008  . OBESITY-MORBID (>100') 09/05/2008  . ESOPHAGEAL REFLUX 09/05/2008  . EDEMA 09/05/2008   Teena Irani,  PTA/CLT [phone]  Teena Irani 12/18/2018, 2:30 PM  [place], [place], [address] Phone: [phone]   Fax:  [phone]  Name: [name] [name] MRN: [id number] Date of Birth: 12/21/1955

## 2018-12-18 NOTE — Patient Instructions (Signed)
    Right foot in front of left, heel touching toe both feet "straight ahead". Stand on Foot Triangle of Support with both feet. Balance in this position _30__ seconds. Do with left foot in front of right.  Single Leg - Eyes Open    Complete near a table or surface.  Stand on one leg and steadily take your hands away from surface, balance on leg.  Complete both sides with 30 second goal  Toe / Heel Raise    Gently rock back on heels and raise toes. Then rock forward on toes and raise heels. Repeat sequence _15___ times per session. Do __2__ sessions per week.

## 2018-12-19 ENCOUNTER — Telehealth (HOSPITAL_COMMUNITY): Payer: Self-pay | Admitting: Internal Medicine

## 2018-12-19 NOTE — Telephone Encounter (Signed)
12/19/18  pt cx said she had to take her granddaughter to [facility] and she is underage and her mom can't go with her

## 2018-12-20 ENCOUNTER — Ambulatory Visit (HOSPITAL_COMMUNITY): Payer: PPO

## 2018-12-23 ENCOUNTER — Encounter (HOSPITAL_COMMUNITY): Payer: Self-pay

## 2018-12-23 ENCOUNTER — Other Ambulatory Visit: Payer: Self-pay

## 2018-12-23 ENCOUNTER — Emergency Department (HOSPITAL_COMMUNITY): Payer: PPO

## 2018-12-23 ENCOUNTER — Inpatient Hospital Stay (HOSPITAL_COMMUNITY)
Admission: EM | Admit: 2018-12-23 | Discharge: 2018-12-26 | DRG: 872 | Disposition: A | Discharge status: Home Health | Payer: PPO | Attending: Internal Medicine | Admitting: Internal Medicine

## 2018-12-23 DIAGNOSIS — I480 Paroxysmal atrial fibrillation: Secondary | ICD-10-CM | POA: Diagnosis present

## 2018-12-23 DIAGNOSIS — E871 Hypo-osmolality and hyponatremia: Secondary | ICD-10-CM | POA: Diagnosis present

## 2018-12-23 DIAGNOSIS — F329 Major depressive disorder, single episode, unspecified: Secondary | ICD-10-CM | POA: Diagnosis present

## 2018-12-23 DIAGNOSIS — F419 Anxiety disorder, unspecified: Secondary | ICD-10-CM | POA: Diagnosis present

## 2018-12-23 DIAGNOSIS — E890 Postprocedural hypothyroidism: Secondary | ICD-10-CM | POA: Diagnosis present

## 2018-12-23 DIAGNOSIS — R509 Fever, unspecified: Secondary | ICD-10-CM | POA: Diagnosis not present

## 2018-12-23 DIAGNOSIS — Z85850 Personal history of malignant neoplasm of thyroid: Secondary | ICD-10-CM

## 2018-12-23 DIAGNOSIS — Z794 Long term (current) use of insulin: Secondary | ICD-10-CM | POA: Diagnosis not present

## 2018-12-23 DIAGNOSIS — E1140 Type 2 diabetes mellitus with diabetic neuropathy, unspecified: Secondary | ICD-10-CM | POA: Diagnosis present

## 2018-12-23 DIAGNOSIS — I340 Nonrheumatic mitral (valve) insufficiency: Secondary | ICD-10-CM | POA: Diagnosis not present

## 2018-12-23 DIAGNOSIS — E785 Hyperlipidemia, unspecified: Secondary | ICD-10-CM | POA: Diagnosis present

## 2018-12-23 DIAGNOSIS — I361 Nonrheumatic tricuspid (valve) insufficiency: Secondary | ICD-10-CM | POA: Diagnosis not present

## 2018-12-23 DIAGNOSIS — Z7901 Long term (current) use of anticoagulants: Secondary | ICD-10-CM | POA: Diagnosis not present

## 2018-12-23 DIAGNOSIS — R600 Localized edema: Secondary | ICD-10-CM | POA: Diagnosis present

## 2018-12-23 DIAGNOSIS — Z6841 Body Mass Index (BMI) 40.0 and over, adult: Secondary | ICD-10-CM

## 2018-12-23 DIAGNOSIS — K7460 Unspecified cirrhosis of liver: Secondary | ICD-10-CM | POA: Diagnosis present

## 2018-12-23 DIAGNOSIS — N390 Urinary tract infection, site not specified: Secondary | ICD-10-CM | POA: Diagnosis present

## 2018-12-23 DIAGNOSIS — Z833 Family history of diabetes mellitus: Secondary | ICD-10-CM

## 2018-12-23 DIAGNOSIS — J449 Chronic obstructive pulmonary disease, unspecified: Secondary | ICD-10-CM | POA: Diagnosis present

## 2018-12-23 DIAGNOSIS — I371 Nonrheumatic pulmonary valve insufficiency: Secondary | ICD-10-CM | POA: Diagnosis not present

## 2018-12-23 DIAGNOSIS — Z91048 Other nonmedicinal substance allergy status: Secondary | ICD-10-CM

## 2018-12-23 DIAGNOSIS — E1159 Type 2 diabetes mellitus with other circulatory complications: Secondary | ICD-10-CM | POA: Diagnosis not present

## 2018-12-23 DIAGNOSIS — E039 Hypothyroidism, unspecified: Secondary | ICD-10-CM | POA: Diagnosis present

## 2018-12-23 DIAGNOSIS — I728 Aneurysm of other specified arteries: Secondary | ICD-10-CM | POA: Diagnosis not present

## 2018-12-23 DIAGNOSIS — E119 Type 2 diabetes mellitus without complications: Secondary | ICD-10-CM

## 2018-12-23 DIAGNOSIS — Z79891 Long term (current) use of opiate analgesic: Secondary | ICD-10-CM | POA: Diagnosis not present

## 2018-12-23 DIAGNOSIS — I4891 Unspecified atrial fibrillation: Secondary | ICD-10-CM | POA: Diagnosis present

## 2018-12-23 DIAGNOSIS — A419 Sepsis, unspecified organism: Secondary | ICD-10-CM | POA: Diagnosis not present

## 2018-12-23 DIAGNOSIS — Z86010 Personal history of colonic polyps: Secondary | ICD-10-CM

## 2018-12-23 DIAGNOSIS — K219 Gastro-esophageal reflux disease without esophagitis: Secondary | ICD-10-CM | POA: Diagnosis present

## 2018-12-23 DIAGNOSIS — Z880 Allergy status to penicillin: Secondary | ICD-10-CM

## 2018-12-23 DIAGNOSIS — Z825 Family history of asthma and other chronic lower respiratory diseases: Secondary | ICD-10-CM

## 2018-12-23 DIAGNOSIS — M1990 Unspecified osteoarthritis, unspecified site: Secondary | ICD-10-CM | POA: Diagnosis present

## 2018-12-23 DIAGNOSIS — R162 Hepatomegaly with splenomegaly, not elsewhere classified: Secondary | ICD-10-CM | POA: Diagnosis not present

## 2018-12-23 DIAGNOSIS — Z79899 Other long term (current) drug therapy: Secondary | ICD-10-CM

## 2018-12-23 DIAGNOSIS — R531 Weakness: Secondary | ICD-10-CM | POA: Diagnosis not present

## 2018-12-23 DIAGNOSIS — A4101 Sepsis due to Methicillin susceptible Staphylococcus aureus: Principal | ICD-10-CM | POA: Diagnosis present

## 2018-12-23 DIAGNOSIS — Z79890 Hormone replacement therapy (postmenopausal): Secondary | ICD-10-CM

## 2018-12-23 DIAGNOSIS — R945 Abnormal results of liver function studies: Secondary | ICD-10-CM | POA: Diagnosis not present

## 2018-12-23 DIAGNOSIS — W19XXXA Unspecified fall, initial encounter: Secondary | ICD-10-CM | POA: Diagnosis not present

## 2018-12-23 DIAGNOSIS — Z885 Allergy status to narcotic agent status: Secondary | ICD-10-CM

## 2018-12-23 DIAGNOSIS — E662 Morbid (severe) obesity with alveolar hypoventilation: Secondary | ICD-10-CM | POA: Diagnosis present

## 2018-12-23 DIAGNOSIS — R7881 Bacteremia: Secondary | ICD-10-CM | POA: Diagnosis not present

## 2018-12-23 DIAGNOSIS — G4730 Sleep apnea, unspecified: Secondary | ICD-10-CM | POA: Diagnosis present

## 2018-12-23 DIAGNOSIS — Z20828 Contact with and (suspected) exposure to other viral communicable diseases: Secondary | ICD-10-CM | POA: Diagnosis present

## 2018-12-23 DIAGNOSIS — R51 Headache: Secondary | ICD-10-CM | POA: Diagnosis not present

## 2018-12-23 DIAGNOSIS — I10 Essential (primary) hypertension: Secondary | ICD-10-CM | POA: Diagnosis present

## 2018-12-23 DIAGNOSIS — Z888 Allergy status to other drugs, medicaments and biological substances status: Secondary | ICD-10-CM

## 2018-12-23 DIAGNOSIS — Z209 Contact with and (suspected) exposure to unspecified communicable disease: Secondary | ICD-10-CM | POA: Diagnosis not present

## 2018-12-23 HISTORY — DX: Atherosclerotic heart disease of native coronary artery without angina pectoris: I25.10

## 2018-12-23 LAB — CBC WITH DIFFERENTIAL/PLATELET
Abs Immature Granulocytes: 0.04 10*3/uL (ref 0.00–0.07)
Basophils Absolute: 0 10*3/uL (ref 0.0–0.1)
Basophils Relative: 0 %
Eosinophils Absolute: 0 10*3/uL (ref 0.0–0.5)
Eosinophils Relative: 0 %
HCT: 32.5 % — ABNORMAL LOW (ref 36.0–46.0)
Hemoglobin: 9.9 g/dL — ABNORMAL LOW (ref 12.0–15.0)
Immature Granulocytes: 1 %
Lymphocytes Relative: 7 %
Lymphs Abs: 0.4 10*3/uL — ABNORMAL LOW (ref 0.7–4.0)
MCH: 25.4 pg — ABNORMAL LOW (ref 26.0–34.0)
MCHC: 30.5 g/dL (ref 30.0–36.0)
MCV: 83.3 fL (ref 80.0–100.0)
Monocytes Absolute: 0.4 10*3/uL (ref 0.1–1.0)
Monocytes Relative: 7 %
Neutro Abs: 5.3 10*3/uL (ref 1.7–7.7)
Neutrophils Relative %: 85 %
Platelets: 119 10*3/uL — ABNORMAL LOW (ref 150–400)
RBC: 3.9 MIL/uL (ref 3.87–5.11)
RDW: 19.9 % — ABNORMAL HIGH (ref 11.5–15.5)
WBC: 6.2 10*3/uL (ref 4.0–10.5)
nRBC: 0 % (ref 0.0–0.2)

## 2018-12-23 LAB — COMPREHENSIVE METABOLIC PANEL
ALT: 22 U/L (ref 0–44)
AST: 31 U/L (ref 15–41)
Albumin: 3.2 g/dL — ABNORMAL LOW (ref 3.5–5.0)
Alkaline Phosphatase: 193 U/L — ABNORMAL HIGH (ref 38–126)
Anion gap: 10 (ref 5–15)
BUN: 10 mg/dL (ref 8–23)
CO2: 23 mmol/L (ref 22–32)
Calcium: 8.5 mg/dL — ABNORMAL LOW (ref 8.9–10.3)
Chloride: 95 mmol/L — ABNORMAL LOW (ref 98–111)
Creatinine, Ser: 0.68 mg/dL (ref 0.44–1.00)
GFR calc Af Amer: >60 mL/min (ref >60)
GFR calc non Af Amer: >60 mL/min (ref >60)
Glucose, Bld: 170 mg/dL — ABNORMAL HIGH (ref 70–99)
Potassium: 4.1 mmol/L (ref 3.5–5.1)
Sodium: 128 mmol/L — ABNORMAL LOW (ref 135–145)
Total Bilirubin: 0.7 mg/dL (ref 0.3–1.2)
Total Protein: 7 g/dL (ref 6.5–8.1)

## 2018-12-23 LAB — URINALYSIS, ROUTINE W REFLEX MICROSCOPIC
Bilirubin Urine: NEGATIVE
Glucose, UA: NEGATIVE mg/dL
Hgb urine dipstick: NEGATIVE
Ketones, ur: NEGATIVE mg/dL
Leukocytes,Ua: NEGATIVE
Nitrite: NEGATIVE
Protein, ur: NEGATIVE mg/dL
Specific Gravity, Urine: 1.006 (ref 1.005–1.030)
pH: 5 (ref 5.0–8.0)

## 2018-12-23 LAB — LACTIC ACID, PLASMA
Lactic Acid, Venous: 1.9 mmol/L (ref 0.5–1.9)
Lactic Acid, Venous: 0.9 mmol/L (ref 0.5–1.9)

## 2018-12-23 LAB — SARS CORONAVIRUS 2 BY RT PCR (HOSPITAL ORDER, PERFORMED IN ~~LOC~~ HOSPITAL LAB): SARS Coronavirus 2: NEGATIVE

## 2018-12-23 MED ORDER — LEVOFLOXACIN IN D5W 500 MG/100ML IV SOLN
500.0000 mg | Freq: Once | INTRAVENOUS | Status: AC
  Administered 2018-12-23: 500 mg via INTRAVENOUS
  Filled 2018-12-23: qty 100

## 2018-12-23 MED ORDER — ONDANSETRON HCL 4 MG/2ML IJ SOLN
4.0000 mg | Freq: Once | INTRAMUSCULAR | Status: AC
  Administered 2018-12-23: 4 mg via INTRAVENOUS
  Filled 2018-12-23: qty 2

## 2018-12-23 MED ORDER — ACETAMINOPHEN 325 MG PO TABS
650.0000 mg | ORAL_TABLET | Freq: Once | ORAL | Status: AC
  Administered 2018-12-23: 650 mg via ORAL
  Filled 2018-12-23: qty 2

## 2018-12-23 MED ORDER — SODIUM CHLORIDE 0.9 % IV BOLUS
3000.0000 mL | Freq: Once | INTRAVENOUS | Status: AC
  Administered 2018-12-23: 3000 mL via INTRAVENOUS

## 2018-12-23 NOTE — ED Triage Notes (Addendum)
Patient called EMS for headache. Patient temp 103.7 on arrival, patient was sitting in her car with no AC for approx 25 minutes per EMS prior to temperature scan.   Denies difficulty breathing.   Pt was dry heaving per EMS en route to [facility]  ED. States she no longer feels nauseous at this time.   Afib on monitor per EMS. PT has hx of AFIB and SVT.  Patient is diabetic, noncompliant with insulin therapy. CBG 280 per EMS.

## 2018-12-23 NOTE — ED Notes (Signed)
Patient is in hallway bed at this time to unavailability of rooms. Fluid boluses have been started, no portable computer available to document at this time.

## 2018-12-23 NOTE — ED Provider Notes (Signed)
[facility] EMERGENCY DEPARTMENT Provider Note   CSN: [id number] Arrival date & time: 12/23/18  1920     History   Chief Complaint Chief Complaint  Patient presents with  . Fever    HPI [name] [name] is a 63 y.o. female.       Patient complains of fever and weakness that started today.  She is recently been admitted to the hospital for sepsis  The history is provided by the patient. No language interpreter was used.  Fever Max temp prior to arrival:  103 Temp source:  Subjective Severity:  Moderate Onset quality:  Sudden Timing:  Constant Progression:  Worsening Chronicity:  New Relieved by:  Nothing Worsened by:  Nothing Ineffective treatments:  None tried Associated symptoms: no chest pain, no congestion, no cough, no diarrhea, no headaches and no rash     Past Medical History:  Diagnosis Date  . Anemia   . Anxiety   . Arthritis   . Asthma   . Atrial fibrillation ([place])   . Cirrhosis of liver ([place])   . COPD (chronic obstructive pulmonary disease) ([place])   . Depression   . Edema   . Essential hypertension, benign   . GERD (gastroesophageal reflux disease)   . Hypothyroidism   . Morbid obesity ([place])   . Overactive bladder   . Sleep apnea    CPAP - not consistent  . Thyroid cancer ([place])   . Type 2 diabetes mellitus [facility])     Patient Active Problem List   Diagnosis Date Noted  . Cellulitis left lower extremity.  11/11/2018  . Sepsis due to undetermined organism ([place]) 11/07/2018  . Atrial fibrillation ([place]) 11/07/2018  . COPD (chronic obstructive pulmonary disease) ([place]) 11/07/2018  . GERD (gastroesophageal reflux disease) 11/07/2018  . Hypothyroidism 11/07/2018  . Sleep apnea 11/07/2018  . Hepatic cirrhosis ([place]) 09/01/2014  . Cirrhosis, nonalcoholic ([place]) 87/86/7672  . Incisional hernia 06/09/2014  . S/P complete repair of rotator cuff 07/24/2012  . Precordial pain 09/30/2010  . Essential hypertension, benign 09/30/2010  . Type 2 diabetes  mellitus ([place]) 09/05/2008  . OBESITY-MORBID (>100') 09/05/2008  . ESOPHAGEAL REFLUX 09/05/2008  . EDEMA 09/05/2008    Past Surgical History:  Procedure Laterality Date  . "pump bumps"     bilateral heels--2000  . ABDOMINAL HYSTERECTOMY  1985  . CARDIAC CATHETERIZATION  2012   Dr. Lia Foyer told her nothing was wrong.  [name] [place]  . CHOLECYSTECTOMY  1996  . COLONOSCOPY WITH PROPOFOL N/A 04/17/2015   Procedure: COLONOSCOPY WITH PROPOFOL  at cecum at 0810; withdrawal time =15 minutes;  Surgeon: [name], MD;  Location: [facility];  Service: Endoscopy;  Laterality: N/A;  . Debridement of abdominal wound  2011  . ESOPHAGEAL DILATION N/A 04/17/2015   Procedure: ESOPHAGEAL DILATION WITH 56FR MALONEY DILATOR;  Surgeon: [name], MD;  Location: [facility];  Service: Endoscopy;  Laterality: N/A;  . ESOPHAGOGASTRODUODENOSCOPY (EGD) WITH PROPOFOL N/A 04/17/2015   Procedure: ESOPHAGOGASTRODUODENOSCOPY (EGD) WITH PROPOFOL;  Surgeon: [name], MD;  Location: [facility];  Service: Endoscopy;  Laterality: N/A;  . EXPLORATORY LAPAROTOMY  2003  . FOOT SURGERY Bilateral 2000   Bone spur removed  . FOOT SURGERY    . INCISIONAL HERNIA REPAIR N/A 06/09/2014   Procedure: RECURRENT  INCISIONAL HERNIORRHAPHY WITH MESH;  Surgeon: [name], MD;  Location: [facility];  Service: General;  Laterality: N/A;  . INCISIONAL HERNIA REPAIR N/A 10/15/2014   Procedure: LAPAROSCOPIC INCISIONAL  HERNIA REPAIR;  Surgeon: [name], MD;  Location: [place];  Service: General;  Laterality: N/A;  . INSERTION OF MESH N/A 06/09/2014   Procedure: INSERTION OF MESH;  Surgeon: [name], MD;  Location: [facility];  Service: General;  Laterality: N/A;  . INSERTION OF MESH N/A 10/15/2014   Procedure: INSERTION OF MESH;  Surgeon: [name], MD;  Location: [place];  Service: General;  Laterality: N/A;  . POLYPECTOMY  04/17/2015   Procedure: POLYPECTOMY;  Surgeon: [name], MD;  Location: [facility];   Service: Endoscopy;;  . RESECTION DISTAL CLAVICAL  05/07/2012   Procedure: RESECTION DISTAL CLAVICAL;  Surgeon: [name], MD;  Location: [facility];  Service: Orthopedics;  Laterality: Right;  . SHOULDER OPEN ROTATOR CUFF REPAIR  05/07/2012   Procedure: ROTATOR CUFF REPAIR SHOULDER OPEN;  Surgeon: [name], MD;  Location: [facility];  Service: Orthopedics;  Laterality: Right;  . THYROIDECTOMY  2009  . TONSILLECTOMY  1958  . UMBILICAL HERNIA REPAIR  2010     OB History   No obstetric history on file.      Home Medications    Prior to Admission medications   Medication Sig Start Date End Date Taking? Authorizing Provider  apixaban (ELIQUIS) 5 MG TABS tablet Take 1 tablet (5 mg total) by mouth 2 (two) times daily. 11/19/18  Yes Satira Sark, MD  colesevelam [facility]) 625 MG tablet Take 1,875 mg by mouth as directed. 3 tabs am 3 tabs pm   Yes [provider]  diltiazem (CARDIZEM CD) 360 MG 24 hr capsule Take 1 capsule (360 mg total) by mouth daily. 03/27/18  Yes Satira Sark, MD  DULoxetine (CYMBALTA) 30 MG capsule Take 30 mg by mouth daily.  01/18/17  Yes [provider]  DULoxetine (CYMBALTA) 60 MG capsule Take 60 mg by mouth daily.  03/20/15  Yes [provider]  econazole nitrate 1 % cream Apply topically daily. Patient taking differently: Apply 1 application topically daily as needed (for rash irritation).  12/09/15  Yes Tuchman, Richard C, DPM  furosemide (LASIX) 20 MG tablet Take 20 mg by mouth daily as needed for fluid.    Yes [provider]  gabapentin (NEURONTIN) 300 MG capsule Take 300 mg by mouth daily. 10/19/18  Yes [provider]  HYDROcodone-acetaminophen (NORCO/VICODIN) 5-325 MG tablet Take 1 tablet by mouth every 4 (four) hours as needed. Patient taking differently: Take 1 tablet by mouth every 4 (four) hours as needed for moderate pain or severe pain.  12/04/18  Yes Lily Kocher, PA-C  hydrOXYzine  (ATARAX/VISTARIL) 25 MG tablet Take 25 mg by mouth 4 (four) times daily as needed for itching.  10/12/18  Yes [provider]  LANTUS SOLOSTAR 100 UNIT/ML Solostar Pen Inject 44 Units into the skin at bedtime.  09/21/18  Yes [provider]  levothyroxine (SYNTHROID) 200 MCG tablet Take 200 mcg by mouth daily before breakfast. Take in addition to 75 mcg for a total of 275 mcg daily   Yes [provider]  levothyroxine (SYNTHROID) 75 MCG tablet Take 50-75 mcg by mouth daily before breakfast. DOSE NEEDS TO BE VERIFIED!   Yes [provider]  losartan (COZAAR) 100 MG tablet Take 100 mg by mouth daily.  04/27/18  Yes [provider]  meloxicam (MOBIC) 15 MG tablet Take 15 mg by mouth daily.  10/22/15  Yes [provider]  metFORMIN (GLUCOPHAGE) 1000 MG tablet Take 1,000 mg by mouth 2 (  two) times daily with a meal.   Yes [provider]  nystatin (NYSTOP) 100000 UNIT/GM POWD Apply 100,000 g topically daily as needed (for rash-irritation). Rash 08/18/10  Yes [provider]  nystatin cream (MYCOSTATIN) Apply 1 application topically 3 (three) times daily.  03/23/17  Yes [provider]  potassium chloride SA (K-DUR,KLOR-CON) 20 MEQ tablet Take 20 mEq by mouth 2 (two) times daily.  04/27/18  Yes [provider]  pravastatin (PRAVACHOL) 20 MG tablet Take 20 mg by mouth daily. 10/30/18  Yes [provider]  tolterodine (DETROL LA) 4 MG 24 hr capsule Take 4 mg by mouth 2 (two) times a day. 08/17/18  Yes [provider]  traZODone (DESYREL) 100 MG tablet Take 100-200 mg by mouth at bedtime. 09/21/18  Yes [provider]  albuterol (PROVENTIL HFA) 108 (90 BASE) MCG/ACT inhaler Inhale 2 puffs into the lungs every 6 (six) hours as needed for wheezing or shortness of breath. Shortness of breath    [provider]    Family History Family History  Problem Relation Age of Onset  . Heart disease Other    . Arthritis Other   . Cancer Other   . Asthma Other   . Diabetes Other   . Kidney disease Other     Social History Social History   Tobacco Use  . Smoking status: Never Smoker  . Smokeless tobacco: Never Used  Substance Use Topics  . Alcohol use: No    Alcohol/week: 0.0 standard drinks  . Drug use: No     Allergies   Doxycycline, Adhesive [tape], Biaxin [clarithromycin], Percocet [oxycodone-acetaminophen], Xarelto [rivaroxaban], Clarithromycin, Clindamycin/lincomycin, Neosporin [neomycin-polymyxin b gu], and Penicillins   Review of Systems Review of Systems  Constitutional: Positive for fever. Negative for appetite change and fatigue.  HENT: Negative for congestion, ear discharge and sinus pressure.   Eyes: Negative for discharge.  Respiratory: Negative for cough.   Cardiovascular: Negative for chest pain.  Gastrointestinal: Negative for abdominal pain and diarrhea.  Genitourinary: Negative for frequency and hematuria.  Musculoskeletal: Negative for back pain.  Skin: Negative for rash.  Neurological: Negative for seizures and headaches.  Psychiatric/Behavioral: Negative for hallucinations.     Physical Exam Updated Vital Signs BP (!) 162/68   Pulse 99   Temp (!) 101.5 F (38.6 C) (Oral)   Resp 15   Ht 5' 2"  (1.575 m)   Wt 132 kg   SpO2 96%   BMI 53.23 kg/m   Physical Exam Vitals signs and nursing note reviewed.  Constitutional:      Appearance: She is well-developed.  HENT:     Head: Normocephalic.     Nose: Nose normal.  Eyes:     General: No scleral icterus.    Conjunctiva/sclera: Conjunctivae normal.  Neck:     Musculoskeletal: Neck supple.     Thyroid: No thyromegaly.  Cardiovascular:     Rate and Rhythm: Normal rate and regular rhythm.     Heart sounds: No murmur. No friction rub. No gallop.   Pulmonary:     Breath sounds: No stridor. No wheezing or rales.  Chest:     Chest wall: No tenderness.  Abdominal:     General: There is no  distension.     Tenderness: There is no abdominal tenderness. There is no rebound.  Musculoskeletal: Normal range of motion.  Lymphadenopathy:     Cervical: No cervical adenopathy.  Skin:    Findings: No erythema or rash.  Neurological:  Mental Status: She is oriented to person, place, and time.     Motor: No abnormal muscle tone.     Coordination: Coordination normal.  Psychiatric:        Behavior: Behavior normal.      ED Treatments / Results  Labs (all labs ordered are listed, but only abnormal results are displayed) Labs Reviewed  COMPREHENSIVE METABOLIC PANEL - Abnormal; Notable for the following components:      Result Value   Sodium 128 (*)    Chloride 95 (*)    Glucose, Bld 170 (*)    Calcium 8.5 (*)    Albumin 3.2 (*)    Alkaline Phosphatase 193 (*)    All other components within normal limits  CBC WITH DIFFERENTIAL/PLATELET - Abnormal; Notable for the following components:   Hemoglobin 9.9 (*)    HCT 32.5 (*)    MCH 25.4 (*)    RDW 19.9 (*)    Platelets 119 (*)    Lymphs Abs 0.4 (*)    All other components within normal limits  CULTURE, BLOOD (ROUTINE X 2)  CULTURE, BLOOD (ROUTINE X 2)  SARS CORONAVIRUS 2 (HOSPITAL ORDER, [facility])  LACTIC ACID, PLASMA  LACTIC ACID, PLASMA  URINALYSIS, ROUTINE W REFLEX MICROSCOPIC    EKG None  Radiology Dg Chest Port 1 View  Result Date: 12/23/2018 CLINICAL DATA:  Fever. Sepsis. EXAM: PORTABLE CHEST 1 VIEW COMPARISON:  12/04/2018 FINDINGS: Body habitus and very low lung volumes limit assessment, patient's abdominal pannus partially obscures lung base evaluation. Unchanged heart size and mediastinal contours. Bronchovascular crowding. No dominant focal airspace disease. No visualized pneumothorax. Surgical clips at the thoracic inlet. IMPRESSION: Limited low lung volume exam. No gross acute abnormality. Consider repeat when patient is able. Electronically Signed   By: Keith Rake M.D.    On: 12/23/2018 20:46    Procedures Procedures (including critical care time)  Medications Ordered in ED Medications  sodium chloride 0.9 % bolus 3,000 mL (0 mLs Intravenous Stopped 12/23/18 2218)  levofloxacin (LEVAQUIN) IVPB 500 mg (0 mg Intravenous Stopped 12/23/18 2156)  acetaminophen (TYLENOL) tablet 650 mg (650 mg Oral Given 12/23/18 2059)     Initial Impression / Assessment and Plan / ED Course  I have reviewed the triage vital signs and the nursing notes.  Pertinent labs & imaging results that were available during my care of the patient were reviewed by me and considered in my medical decision making (see chart for details).       Patient with febrile illness.  Patient with significant weakness.  Urinalysis pending.  Suspect urinary tract infection.  She will be admitted to medicine  Final Clinical Impressions(s) / ED Diagnoses   Final diagnoses:  Febrile illness, acute    ED Discharge Orders    None       Milton Ferguson, MD 12/23/18 2310

## 2018-12-24 ENCOUNTER — Encounter (HOSPITAL_COMMUNITY): Payer: Self-pay

## 2018-12-24 ENCOUNTER — Observation Stay (HOSPITAL_BASED_OUTPATIENT_CLINIC_OR_DEPARTMENT_OTHER): Payer: PPO

## 2018-12-24 ENCOUNTER — Ambulatory Visit: Payer: PPO | Admitting: Orthopedic Surgery

## 2018-12-24 ENCOUNTER — Observation Stay (HOSPITAL_COMMUNITY): Payer: PPO

## 2018-12-24 DIAGNOSIS — E871 Hypo-osmolality and hyponatremia: Secondary | ICD-10-CM | POA: Diagnosis present

## 2018-12-24 DIAGNOSIS — R7881 Bacteremia: Secondary | ICD-10-CM

## 2018-12-24 DIAGNOSIS — I728 Aneurysm of other specified arteries: Secondary | ICD-10-CM | POA: Diagnosis not present

## 2018-12-24 DIAGNOSIS — R162 Hepatomegaly with splenomegaly, not elsewhere classified: Secondary | ICD-10-CM | POA: Diagnosis not present

## 2018-12-24 DIAGNOSIS — E1159 Type 2 diabetes mellitus with other circulatory complications: Secondary | ICD-10-CM

## 2018-12-24 DIAGNOSIS — E039 Hypothyroidism, unspecified: Secondary | ICD-10-CM

## 2018-12-24 DIAGNOSIS — R509 Fever, unspecified: Secondary | ICD-10-CM

## 2018-12-24 DIAGNOSIS — R945 Abnormal results of liver function studies: Secondary | ICD-10-CM | POA: Diagnosis present

## 2018-12-24 DIAGNOSIS — I480 Paroxysmal atrial fibrillation: Secondary | ICD-10-CM

## 2018-12-24 DIAGNOSIS — Z794 Long term (current) use of insulin: Secondary | ICD-10-CM

## 2018-12-24 DIAGNOSIS — I10 Essential (primary) hypertension: Secondary | ICD-10-CM

## 2018-12-24 LAB — BLOOD CULTURE ID PANEL (REFLEXED)

## 2018-12-24 LAB — COMPREHENSIVE METABOLIC PANEL
ALT: 19 U/L (ref 0–44)
AST: 24 U/L (ref 15–41)
Albumin: 2.6 g/dL — ABNORMAL LOW (ref 3.5–5.0)
Alkaline Phosphatase: 162 U/L — ABNORMAL HIGH (ref 38–126)
Anion gap: 7 (ref 5–15)
BUN: 7 mg/dL — ABNORMAL LOW (ref 8–23)
CO2: 23 mmol/L (ref 22–32)
Calcium: 8 mg/dL — ABNORMAL LOW (ref 8.9–10.3)
Chloride: 100 mmol/L (ref 98–111)
Creatinine, Ser: 0.52 mg/dL (ref 0.44–1.00)
GFR calc Af Amer: >60 mL/min (ref >60)
GFR calc non Af Amer: >60 mL/min (ref >60)
Glucose, Bld: 165 mg/dL — ABNORMAL HIGH (ref 70–99)
Potassium: 3.2 mmol/L — ABNORMAL LOW (ref 3.5–5.1)
Sodium: 130 mmol/L — ABNORMAL LOW (ref 135–145)
Total Bilirubin: 0.9 mg/dL (ref 0.3–1.2)
Total Protein: 6.1 g/dL — ABNORMAL LOW (ref 6.5–8.1)

## 2018-12-24 LAB — CBC
HCT: 31.1 % — ABNORMAL LOW (ref 36.0–46.0)
Hemoglobin: 9.8 g/dL — ABNORMAL LOW (ref 12.0–15.0)
MCH: 25.7 pg — ABNORMAL LOW (ref 26.0–34.0)
MCHC: 31.5 g/dL (ref 30.0–36.0)
MCV: 81.6 fL (ref 80.0–100.0)
Platelets: 102 10*3/uL — ABNORMAL LOW (ref 150–400)
RBC: 3.81 MIL/uL — ABNORMAL LOW (ref 3.87–5.11)
RDW: 19.7 % — ABNORMAL HIGH (ref 11.5–15.5)
WBC: 5.1 10*3/uL (ref 4.0–10.5)
nRBC: 0 % (ref 0.0–0.2)

## 2018-12-24 LAB — OSMOLALITY: Osmolality: 275 mOsm/kg (ref 275–295)

## 2018-12-24 LAB — GLUCOSE, CAPILLARY
Glucose-Capillary: 142 mg/dL — ABNORMAL HIGH (ref 70–99)
Glucose-Capillary: 141 mg/dL — ABNORMAL HIGH (ref 70–99)
Glucose-Capillary: 109 mg/dL — ABNORMAL HIGH (ref 70–99)
Glucose-Capillary: 122 mg/dL — ABNORMAL HIGH (ref 70–99)

## 2018-12-24 LAB — GAMMA GT: GGT: 43 U/L (ref 7–50)

## 2018-12-24 LAB — CORTISOL: Cortisol, Plasma: 6.4 ug/dL

## 2018-12-24 LAB — C-REACTIVE PROTEIN: CRP: 7.2 mg/dL — ABNORMAL HIGH (ref <1.0)

## 2018-12-24 LAB — ECHOCARDIOGRAM LIMITED
Height: 65 in
Weight: 4659.64 oz

## 2018-12-24 LAB — SEDIMENTATION RATE: Sed Rate: 42 mm/hr — ABNORMAL HIGH (ref 0–22)

## 2018-12-24 LAB — TSH: TSH: 2.298 u[IU]/mL (ref 0.350–4.500)

## 2018-12-24 LAB — PROCALCITONIN: Procalcitonin: 0.1 ng/mL

## 2018-12-24 MED ORDER — ACETAMINOPHEN 325 MG PO TABS: 650.0000 mg | ORAL_TABLET | Freq: Four times a day (QID) | ORAL | Status: DC | PRN

## 2018-12-24 MED ORDER — TRAZODONE HCL 50 MG PO TABS
100.0000 mg | ORAL_TABLET | Freq: Every day | ORAL | Status: DC
  Administered 2018-12-24: 100 mg via ORAL
  Administered 2018-12-25: 200 mg via ORAL
  Filled 2018-12-24: qty 2
  Filled 2018-12-24: qty 4

## 2018-12-24 MED ORDER — HYDROXYZINE HCL 25 MG PO TABS
25.0000 mg | ORAL_TABLET | Freq: Four times a day (QID) | ORAL | Status: DC | PRN
  Administered 2018-12-24: 25 mg via ORAL
  Filled 2018-12-24: qty 1

## 2018-12-24 MED ORDER — COLESEVELAM HCL 625 MG PO TABS
1875.0000 mg | ORAL_TABLET | Freq: Two times a day (BID) | ORAL | Status: DC
  Administered 2018-12-24 – 2018-12-26 (×3): 1875 mg via ORAL
  Filled 2018-12-24 (×11): qty 3

## 2018-12-24 MED ORDER — HYDROCODONE-ACETAMINOPHEN 5-325 MG PO TABS
1.0000 | ORAL_TABLET | ORAL | Status: DC | PRN
  Administered 2018-12-24 – 2018-12-26 (×8): 1 via ORAL
  Filled 2018-12-24 (×8): qty 1

## 2018-12-24 MED ORDER — SODIUM CHLORIDE 0.9 % IV SOLN
INTRAVENOUS | Status: AC
  Administered 2018-12-24: 75 mL/h via INTRAVENOUS

## 2018-12-24 MED ORDER — METFORMIN HCL 500 MG PO TABS
1000.0000 mg | ORAL_TABLET | Freq: Two times a day (BID) | ORAL | Status: DC
  Administered 2018-12-24 – 2018-12-26 (×5): 1000 mg via ORAL
  Filled 2018-12-24 (×6): qty 2

## 2018-12-24 MED ORDER — ONDANSETRON 4 MG PO TBDP: 4.0000 mg | ORAL_TABLET | Freq: Three times a day (TID) | ORAL | Status: DC | PRN

## 2018-12-24 MED ORDER — DULOXETINE HCL 60 MG PO CPEP
90.0000 mg | ORAL_CAPSULE | Freq: Every day | ORAL | Status: DC
  Administered 2018-12-24 – 2018-12-26 (×3): 90 mg via ORAL
  Filled 2018-12-24: qty 1
  Filled 2018-12-24 (×2): qty 3

## 2018-12-24 MED ORDER — VANCOMYCIN HCL 10 G IV SOLR
1500.0000 mg | Freq: Once | INTRAVENOUS | Status: DC
  Filled 2018-12-24: qty 1500

## 2018-12-24 MED ORDER — ALBUTEROL SULFATE (2.5 MG/3ML) 0.083% IN NEBU: 3.0000 mL | INHALATION_SOLUTION | Freq: Four times a day (QID) | RESPIRATORY_TRACT | Status: DC | PRN

## 2018-12-24 MED ORDER — POTASSIUM CHLORIDE CRYS ER 20 MEQ PO TBCR
20.0000 meq | EXTENDED_RELEASE_TABLET | Freq: Two times a day (BID) | ORAL | Status: DC
  Filled 2018-12-24: qty 1

## 2018-12-24 MED ORDER — LEVOTHYROXINE SODIUM 100 MCG PO TABS
200.0000 ug | ORAL_TABLET | Freq: Every day | ORAL | Status: DC
  Administered 2018-12-24 – 2018-12-26 (×3): 200 ug via ORAL
  Filled 2018-12-24: qty 4
  Filled 2018-12-24: qty 2
  Filled 2018-12-24: qty 4

## 2018-12-24 MED ORDER — FESOTERODINE FUMARATE ER 4 MG PO TB24
4.0000 mg | ORAL_TABLET | Freq: Every day | ORAL | Status: DC
  Administered 2018-12-24 – 2018-12-26 (×3): 4 mg via ORAL
  Filled 2018-12-24 (×3): qty 1

## 2018-12-24 MED ORDER — VANCOMYCIN HCL 10 G IV SOLR
1500.0000 mg | Freq: Once | INTRAVENOUS | Status: AC
  Administered 2018-12-24: 1500 mg via INTRAVENOUS
  Filled 2018-12-24: qty 1500

## 2018-12-24 MED ORDER — VANCOMYCIN HCL 10 G IV SOLR
1250.0000 mg | Freq: Two times a day (BID) | INTRAVENOUS | Status: DC
  Administered 2018-12-24: 1250 mg via INTRAVENOUS
  Filled 2018-12-24 (×2): qty 1250

## 2018-12-24 MED ORDER — NYSTATIN 100000 UNIT/GM EX POWD
100000.0000 g | Freq: Every day | CUTANEOUS | Status: DC | PRN
  Administered 2018-12-24 – 2018-12-25 (×4): 1 g via TOPICAL
  Filled 2018-12-24: qty 15

## 2018-12-24 MED ORDER — NYSTATIN 100000 UNIT/GM EX CREA
1.0000 "application " | TOPICAL_CREAM | Freq: Three times a day (TID) | CUTANEOUS | Status: DC
  Administered 2018-12-24 – 2018-12-25 (×5): 1 via TOPICAL
  Filled 2018-12-24: qty 15

## 2018-12-24 MED ORDER — POTASSIUM CHLORIDE CRYS ER 20 MEQ PO TBCR
40.0000 meq | EXTENDED_RELEASE_TABLET | Freq: Two times a day (BID) | ORAL | Status: DC
  Administered 2018-12-24 – 2018-12-26 (×5): 40 meq via ORAL
  Filled 2018-12-24 (×4): qty 2

## 2018-12-24 MED ORDER — ACETAMINOPHEN 650 MG RE SUPP: 650.0000 mg | Freq: Four times a day (QID) | RECTAL | Status: DC | PRN

## 2018-12-24 MED ORDER — VANCOMYCIN HCL IN DEXTROSE 1-5 GM/200ML-% IV SOLN
1000.0000 mg | Freq: Once | INTRAVENOUS | Status: AC
  Administered 2018-12-24: 1000 mg via INTRAVENOUS
  Filled 2018-12-24: qty 200

## 2018-12-24 MED ORDER — ENOXAPARIN SODIUM 80 MG/0.8ML ~~LOC~~ SOLN: 0.5000 mg/kg | SUBCUTANEOUS | Status: DC

## 2018-12-24 MED ORDER — DILTIAZEM HCL ER COATED BEADS 180 MG PO CP24
360.0000 mg | ORAL_CAPSULE | Freq: Every day | ORAL | Status: DC
  Administered 2018-12-24 – 2018-12-26 (×3): 360 mg via ORAL
  Filled 2018-12-24: qty 2
  Filled 2018-12-24: qty 1
  Filled 2018-12-24: qty 2
  Filled 2018-12-24 (×2): qty 1
  Filled 2018-12-24: qty 2

## 2018-12-24 MED ORDER — FUROSEMIDE 20 MG PO TABS: 20.0000 mg | ORAL_TABLET | Freq: Every day | ORAL | Status: DC | PRN

## 2018-12-24 MED ORDER — PRAVASTATIN SODIUM 10 MG PO TABS
20.0000 mg | ORAL_TABLET | Freq: Every day | ORAL | Status: DC
  Administered 2018-12-24 – 2018-12-26 (×3): 20 mg via ORAL
  Filled 2018-12-24 (×2): qty 1
  Filled 2018-12-24: qty 2
  Filled 2018-12-24: qty 1
  Filled 2018-12-24 (×2): qty 2

## 2018-12-24 MED ORDER — INSULIN ASPART 100 UNIT/ML ~~LOC~~ SOLN
0.0000 [IU] | Freq: Three times a day (TID) | SUBCUTANEOUS | Status: DC
  Administered 2018-12-24 – 2018-12-25 (×3): 1 [IU] via SUBCUTANEOUS
  Administered 2018-12-26: 2 [IU] via SUBCUTANEOUS

## 2018-12-24 MED ORDER — LEVOTHYROXINE SODIUM 75 MCG PO TABS
75.0000 ug | ORAL_TABLET | Freq: Every day | ORAL | Status: DC
  Administered 2018-12-24 – 2018-12-26 (×3): 75 ug via ORAL
  Filled 2018-12-24: qty 1
  Filled 2018-12-24 (×2): qty 2

## 2018-12-24 MED ORDER — IOHEXOL 300 MG/ML  SOLN
100.0000 mL | Freq: Once | INTRAMUSCULAR | Status: AC | PRN
  Administered 2018-12-24: 100 mL via INTRAVENOUS

## 2018-12-24 MED ORDER — ECONAZOLE NITRATE 1 % EX CREA: 1.0000 "application " | TOPICAL_CREAM | Freq: Every day | CUTANEOUS | Status: DC | PRN

## 2018-12-24 MED ORDER — VANCOMYCIN HCL 10 G IV SOLR
INTRAVENOUS | Status: AC
  Filled 2018-12-24: qty 1500

## 2018-12-24 MED ORDER — VANCOMYCIN HCL IN DEXTROSE 1-5 GM/200ML-% IV SOLN: 1000.0000 mg | Freq: Once | INTRAVENOUS | Status: DC

## 2018-12-24 MED ORDER — LOSARTAN POTASSIUM 50 MG PO TABS
100.0000 mg | ORAL_TABLET | Freq: Every day | ORAL | Status: DC
  Administered 2018-12-24 – 2018-12-26 (×3): 100 mg via ORAL
  Filled 2018-12-24: qty 2
  Filled 2018-12-24 (×2): qty 4

## 2018-12-24 MED ORDER — DULOXETINE HCL 30 MG PO CPEP: 30.0000 mg | ORAL_CAPSULE | Freq: Every day | ORAL | Status: DC

## 2018-12-24 MED ORDER — CEFAZOLIN SODIUM-DEXTROSE 2-4 GM/100ML-% IV SOLN
2.0000 g | Freq: Three times a day (TID) | INTRAVENOUS | Status: DC
  Administered 2018-12-24 – 2018-12-26 (×6): 2 g via INTRAVENOUS
  Filled 2018-12-24 (×6): qty 100

## 2018-12-24 MED ORDER — INSULIN GLARGINE 100 UNIT/ML ~~LOC~~ SOLN
44.0000 [IU] | Freq: Every day | SUBCUTANEOUS | Status: DC
  Administered 2018-12-24: 44 [IU] via SUBCUTANEOUS
  Administered 2018-12-25: 30 [IU] via SUBCUTANEOUS
  Filled 2018-12-24 (×3): qty 0.44

## 2018-12-24 MED ORDER — SODIUM CHLORIDE 0.9 % IV SOLN
2.0000 g | Freq: Three times a day (TID) | INTRAVENOUS | Status: DC
  Administered 2018-12-24 (×2): 2 g via INTRAVENOUS
  Filled 2018-12-24 (×2): qty 2

## 2018-12-24 MED ORDER — GABAPENTIN 300 MG PO CAPS
300.0000 mg | ORAL_CAPSULE | Freq: Every day | ORAL | Status: DC
  Administered 2018-12-24 – 2018-12-26 (×3): 300 mg via ORAL
  Filled 2018-12-24 (×4): qty 1

## 2018-12-24 MED ORDER — INSULIN ASPART 100 UNIT/ML ~~LOC~~ SOLN: 0.0000 [IU] | Freq: Every day | SUBCUTANEOUS | Status: DC

## 2018-12-24 NOTE — Progress Notes (Signed)
*  PRELIMINARY RESULTS* Echocardiogram 2D Echocardiogram LIMITED has been performed.  Leavy Cella 12/24/2018, 11:17 AM

## 2018-12-24 NOTE — Progress Notes (Signed)
Pharmacy Antibiotic Note- Preliminary  [name] [name] is a 63 y.o. female admitted on 12/23/2018 with sepsis.  Pharmacy has been consulted for vancomycin and levofloxacin dosing. Recently admitted (11/07/2018) with sepsis secondary to LLE cellulitis. Allergy to penicillin is rash and has previously tolerated cefepime and ceftriaxone.  Plan: Vancomycin loading dose 2539m IV x1 followed byVancomycin 1250 mg IV every 12 hours.  Goal trough 15-20 mcg/mL.  Recommend using cefepime rather than levofloxacin. Cefepime 2grams IV every 8 hours.  Height: 5' 2"  (157.5 cm) Weight: 291 lb 0.1 oz (132 kg) IBW/kg (Calculated) : 50.1  Temp (24hrs), Avg:100.8 F (38.2 C), Min:98.3 F (36.8 C), Max:101.6 F (38.7 C)  Recent Labs  Lab 12/23/18 2010 12/23/18 2236  WBC 6.2  --   CREATININE 0.68  --   LATICACIDVEN 1.9 0.9    Estimated Creatinine Clearance: 93 mL/min (by C-G formula based on SCr of 0.68 mg/dL).    Allergies  Allergen Reactions  . Doxycycline Itching  . Adhesive [Tape] Other (See Comments)    Blisters skin  . Biaxin [Clarithromycin] Other (See Comments)    Really bad yeast infection  . Percocet [Oxycodone-Acetaminophen] Itching  . Xarelto [Rivaroxaban] Itching  . Clarithromycin Rash    Yeast Infection  . Clindamycin/Lincomycin Rash    Yeast Infection   . Neosporin [Neomycin-Polymyxin B Gu] Rash    Eyes Drops only  . Penicillins Rash    Has patient had a PCN reaction causing immediate rash, facial/tongue/throat swelling, SOB or lightheadedness with hypotension: Yes Has patient had a PCN reaction causing severe rash involving mucus membranes or skin necrosis: No Has patient had a PCN reaction that required hospitalization: No Has patient had a PCN reaction occurring within the last 10 years: No If all of the above answers are "NO", then may proceed with Cephalosporin use.     Antimicrobials this admission: Levofloxacin 5087mIV x1 in ED on 7/5  Dose adjustments this  admission: none  Microbiology results: 7/5 BCx: pending 7/5 COVID-19: negative  Thank you for allowing pharmacy to be a part of this patient's care.  ViJordan Hawksorrest 12/24/2018 1:45 AM

## 2018-12-24 NOTE — Progress Notes (Signed)
CRITICAL VALUE ALERT  Critical Value:  Positive Cocci Blood culture  Date & Time Notied:  12/24/2018 10"22am  Provider Notified: Dr. Manuella Ghazi  Orders Received/Actions taken: No New orders at this time.

## 2018-12-24 NOTE — H&P (Addendum)
TRH H&P    Patient Demographics:    [name] [name], is a 64 y.o. female  MRN: [id number]  DOB - [birth year]  Admit Date - 12/23/2018  Referring MD/NP/PA: Denton Meek  Outpatient Primary MD for the patient is Redmond School, MD  Patient coming from:  home  Chief complaint-  fever   HPI:    [name] [name]  is a 63 y.o. female, w iron def anemia, NASH, h/o thyroid cancer, hypothyroidism,  hypertension, Dm2, chronic Afib, morbid obeisity, OSA, asthma/ Copd, recent admission for sepsis due to left lower extremity cellulitis 11/07/2018 apparently presents with c/o fever.  Pt called EMS for headache, fever, T 103.7.  Pt having dry heaves en route to ER per RN notes.  Pt feels like she might be getting septic as before.   Pt denies rash, joint pain, cough, cp, palp, abd pain, diarrhea, dysuria, hematuria.   In ED,  T 101.6  P 88, as high as 105, R 18, Bp 166/62 pox 98% on RA  Na 128, K 4.1,  Bun 10, Creatinine 0.68 Alb 3.2 Glucose 170 Ast 31, Alt 22, Alk phos 193, T. Bili 0.7  Urinalysis negative Lactic acid 1.9-> 0.9  Blood culture x2 pending Covid 19 negative  pt received levaquin prior to urine being collected.   Pt will be admitted for fever of unclear source.     Review of systems:    In addition to the HPI above,   Not unusual to have headache per patient  No changes with Vision or hearing, No problems swallowing food or Liquids, No Chest pain, Cough or Shortness of Breath, No Abdominal pain, No Nausea or Vomiting, bowel movements are regular, No Blood in stool or Urine, No dysuria, No new skin rashes or bruises, No new joints pains-aches,  No new weakness, tingling, numbness in any extremity, No recent weight gain or loss, No polyuria, polydypsia or polyphagia, No significant Mental Stressors.  All other systems reviewed and are negative.    Past History of the following :     Past Medical History:  Diagnosis Date  . Anemia   . Anxiety   . Arthritis   . Asthma   . Atrial fibrillation ([place])   . Cirrhosis of liver ([place])   . COPD (chronic obstructive pulmonary disease) ([place])   . Depression   . Edema   . Essential hypertension, benign   . GERD (gastroesophageal reflux disease)   . Hypothyroidism   . Morbid obesity ([place])   . Overactive bladder   . Sleep apnea    CPAP - not consistent  . Thyroid cancer ([place])   . Type 2 diabetes mellitus ([place])       Past Surgical History:  Procedure Laterality Date  . "pump bumps"     bilateral heels--2000  . ABDOMINAL HYSTERECTOMY  1985  . CARDIAC CATHETERIZATION  2012   Dr. Lia Foyer told her nothing was wrong.  [name] [place]  . CHOLECYSTECTOMY  1996  . COLONOSCOPY WITH PROPOFOL N/A 04/17/2015   Procedure: COLONOSCOPY  WITH PROPOFOL  at cecum at 0810; withdrawal time =15 minutes;  Surgeon: [name], MD;  Location: [facility];  Service: Endoscopy;  Laterality: N/A;  . Debridement of abdominal wound  2011  . ESOPHAGEAL DILATION N/A 04/17/2015   Procedure: ESOPHAGEAL DILATION WITH 56FR MALONEY DILATOR;  Surgeon: [name], MD;  Location: [facility];  Service: Endoscopy;  Laterality: N/A;  . ESOPHAGOGASTRODUODENOSCOPY (EGD) WITH PROPOFOL N/A 04/17/2015   Procedure: ESOPHAGOGASTRODUODENOSCOPY (EGD) WITH PROPOFOL;  Surgeon: [name], MD;  Location: [facility];  Service: Endoscopy;  Laterality: N/A;  . EXPLORATORY LAPAROTOMY  2003  . FOOT SURGERY Bilateral 2000   Bone spur removed  . FOOT SURGERY    . INCISIONAL HERNIA REPAIR N/A 06/09/2014   Procedure: RECURRENT  INCISIONAL HERNIORRHAPHY WITH MESH;  Surgeon: [name], MD;  Location: [facility];  Service: General;  Laterality: N/A;  . INCISIONAL HERNIA REPAIR N/A 10/15/2014   Procedure: [place];  Surgeon: [name], MD;  Location: [place];  Service: General;  Laterality: N/A;  . INSERTION OF MESH N/A 06/09/2014    Procedure: INSERTION OF MESH;  Surgeon: [name], MD;  Location: [facility];  Service: General;  Laterality: N/A;  . INSERTION OF MESH N/A 10/15/2014   Procedure: INSERTION OF MESH;  Surgeon: [name], MD;  Location: [place];  Service: General;  Laterality: N/A;  . POLYPECTOMY  04/17/2015   Procedure: POLYPECTOMY;  Surgeon: [name], MD;  Location: [facility];  Service: Endoscopy;;  . RESECTION DISTAL CLAVICAL  05/07/2012   Procedure: RESECTION DISTAL CLAVICAL;  Surgeon: [name], MD;  Location[facility];  Service: Orthopedics;  Laterality: Right;  . SHOULDER OPEN ROTATOR CUFF REPAIR  05/07/2012   Procedure: ROTATOR CUFF REPAIR SHOULDER OPEN;  Surgeon: [name], MD;  Location: [facility];  Service: Orthopedics;  Laterality: Right;  . THYROIDECTOMY  2009  . TONSILLECTOMY  1958  . UMBILICAL HERNIA REPAIR  2010      Social History:      Social History   Tobacco Use  . Smoking status: Never Smoker  . Smokeless tobacco: Never Used  Substance Use Topics  . Alcohol use: No    Alcohol/week: 0.0 standard drinks       Family History :     Family History  Problem Relation Age of Onset  . Heart disease Other   . Arthritis Other   . Cancer Other   . Asthma Other   . Diabetes Other   . Kidney disease Other        Home Medications:   Prior to Admission medications   Medication Sig Start Date End Date Taking? Authorizing Provider  apixaban (ELIQUIS) 5 MG TABS tablet Take 1 tablet (5 mg total) by mouth 2 (two) times daily. 11/19/18  Yes Satira Sark, MD  col[facility]) 625 MG tablet Take 1,875 mg by mouth as directed. 3 tabs am 3 tabs pm   Yes [provider]  diltiazem (CARDIZEM CD) 360 MG 24 hr capsule Take 1 capsule (360 mg total) by mouth daily. 03/27/18  Yes Satira Sark, MD  DULoxetine (CYMBALTA) 30 MG capsule Take 30 mg by mouth daily.  01/18/17  Yes [provider]  DULoxetine (CYMBALTA) 60 MG capsule Take 60 mg by  mouth daily.  03/20/15  Yes [provider]  econazole nitrate 1 % cream Apply topically daily. Patient taking differently: Apply 1 application topically daily as needed (for rash  irritation).  12/09/15  Yes Tuchman, Richard C, DPM  furosemide (LASIX) 20 MG tablet Take 20 mg by mouth daily as needed for fluid.    Yes [provider]  gabapentin (NEURONTIN) 300 MG capsule Take 300 mg by mouth daily. 10/19/18  Yes [provider]  HYDROcodone-acetaminophen (NORCO/VICODIN) 5-325 MG tablet Take 1 tablet by mouth every 4 (four) hours as needed. Patient taking differently: Take 1 tablet by mouth every 4 (four) hours as needed for moderate pain or severe pain.  12/04/18  Yes Lily Kocher, PA-C  hydrOXYzine (ATARAX/VISTARIL) 25 MG tablet Take 25 mg by mouth 4 (four) times daily as needed for itching.  10/12/18  Yes [provider]  LANTUS SOLOSTAR 100 UNIT/ML Solostar Pen Inject 44 Units into the skin at bedtime.  09/21/18  Yes [provider]  levothyroxine (SYNTHROID) 200 MCG tablet Take 200 mcg by mouth daily before breakfast. Take in addition to 75 mcg for a total of 275 mcg daily   Yes [provider]  levothyroxine (SYNTHROID) 75 MCG tablet Take 50-75 mcg by mouth daily before breakfast. DOSE NEEDS TO BE VERIFIED!   Yes [provider]  losartan (COZAAR) 100 MG tablet Take 100 mg by mouth daily.  04/27/18  Yes [provider]  meloxicam (MOBIC) 15 MG tablet Take 15 mg by mouth daily.  10/22/15  Yes [provider]  metFORMIN (GLUCOPHAGE) 1000 MG tablet Take 1,000 mg by mouth 2 (two) times daily with a meal.   Yes [provider]  nystatin (NYSTOP) 100000 UNIT/GM POWD Apply 100,000 g topically daily as needed (for rash-irritation). Rash 08/18/10  Yes [provider]  nystatin cream (MYCOSTATIN) Apply 1 application topically 3 (three) times daily.  03/23/17  Yes [provider]  potassium chloride SA  (K-DUR,KLOR-CON) 20 MEQ tablet Take 20 mEq by mouth 2 (two) times daily.  04/27/18  Yes [provider]  pravastatin (PRAVACHOL) 20 MG tablet Take 20 mg by mouth daily. 10/30/18  Yes [provider]  tolterodine (DETROL LA) 4 MG 24 hr capsule Take 4 mg by mouth 2 (two) times a day. 08/17/18  Yes [provider]  traZODone (DESYREL) 100 MG tablet Take 100-200 mg by mouth at bedtime. 09/21/18  Yes [provider]  albuterol (PROVENTIL HFA) 108 (90 BASE) MCG/ACT inhaler Inhale 2 puffs into the lungs every 6 (six) hours as needed for wheezing or shortness of breath. Shortness of breath    [provider]     Allergies:     Allergies  Allergen Reactions  . Doxycycline Itching  . Adhesive [Tape] Other (See Comments)    Blisters skin  . Biaxin [Clarithromycin] Other (See Comments)    Really bad yeast infection  . Percocet [Oxycodone-Acetaminophen] Itching  . Xarelto [Rivaroxaban] Itching  . Clarithromycin Rash    Yeast Infection  . Clindamycin/Lincomycin Rash    Yeast Infection   . Neosporin [Neomycin-Polymyxin B Gu] Rash    Eyes Drops only  . Penicillins Rash    Has patient had a PCN reaction causing immediate rash, facial/tongue/throat swelling, SOB or lightheadedness with hypotension: Yes Has patient had a PCN reaction causing severe rash involving mucus membranes or skin necrosis: No Has patient had a PCN reaction that required hospitalization: No Has patient had a PCN reaction occurring within the last 10 years: No If all of the above answers are "NO", then may proceed with Cephalosporin use.      Physical Exam:   Vitals  Blood  pressure (!) 141/66, pulse 97, temperature (!) 101.5 F (38.6 C), temperature source Oral, resp. rate 18, height 5' 2"  (1.575 m), weight 132 kg, SpO2 100 %. BMI 53.23  1.  General: axox3  2. Psychiatric: euthymic  3. Neurologic: Cn2-12 intact, reflexes 2+ symmetric, diffuse with no clonus, motor 5/5 in all  4 ext  4. HEENMT:  Anicteric, pupils 1.70m symmetric, direct, consensual, near intact Neck: no jvd  5. Respiratory : CTAB  6. Cardiovascular : rrr s1, s2, no m/g/r  7. Gastrointestinal:  Abd: soft, morbidly obese, nt, nd, +bs,  Slight excoriations over the abdomen  8. Skin:  Ext: no c/c/e,  Excoriations over the bilateral distal lower ext  9.Musculoskeletal:  Good ROM  No adenopathy    Data Review:    CBC Recent Labs  Lab 12/23/18 2010  WBC 6.2  HGB 9.9*  HCT 32.5*  PLT 119*  MCV 83.3  MCH 25.4*  MCHC 30.5  RDW 19.9*  LYMPHSABS 0.4*  MONOABS 0.4  EOSABS 0.0  BASOSABS 0.0   ------------------------------------------------------------------------------------------------------------------  Results for orders placed or performed during the hospital encounter of 12/23/18 (from the past 48 hour(s))  Urinalysis, Routine w reflex microscopic     Status: Abnormal   Collection Time: 12/23/18  7:36 PM  Result Value Ref Range   Color, Urine STRAW (A) YELLOW   APPearance CLEAR CLEAR   Specific Gravity, Urine 1.006 1.005 - 1.030   pH 5.0 5.0 - 8.0   Glucose, UA NEGATIVE NEGATIVE mg/dL   Hgb urine dipstick NEGATIVE NEGATIVE   Bilirubin Urine NEGATIVE NEGATIVE   Ketones, ur NEGATIVE NEGATIVE mg/dL   Protein, ur NEGATIVE NEGATIVE mg/dL   Nitrite NEGATIVE NEGATIVE   Leukocytes,Ua NEGATIVE NEGATIVE    Comment: Performed at [facility] [address], [place] [id number] Lactic acid, plasma     Status: None   Collection Time: 12/23/18  8:10 PM  Result Value Ref Range   Lactic Acid, Venous 1.9 0.5 - 1.9 mmol/L    Comment: [facility]formed at [facility] [address] [place] [address] Comprehensive metabolic panel     Status: Abnormal   Collection Time: 12/23/18  8:10 PM  Result Value Ref Range   Sodium 128 (L) 135 - 145 mmol/L   Potassium 4.1 3.5 - 5.1 mmol/L   Chloride 95 (L) 98 - 111 mmol/L   CO2 23 22 - 32 mmol/L   Glucose, Bld 170 (H) 70 - 99  mg/dL   BUN 10 8 - 23 mg/dL   Creatinine, Ser 0.68 0.44 - 1.00 mg/dL   Calcium 8.5 (L) 8.9 - 10.3 mg/dL   Total Protein 7.0 6.5 - 8.1 g/dL   Albumin 3.2 (L) 3.5 - 5.0 g/dL   AST 31 15 - 41 U/L   ALT 22 0 - 44 U/L   Alkaline Phosphatase 193 (H) 38 - 126 U/L   Total Bilirubin 0.7 0.3 - 1.2 mg/dL   GFR calc non Af Amer >60 >60 mL/min   GFR calc Af Amer >60 >60 mL/min   Anion gap 10 5 - 15    Comment: [facility] [address] [place] [address] CBC WITH DIFFERENTIAL     Status: Abnormal   Collection Time: 12/23/18  8:10 PM  Result Value Ref Range   WBC 6.2 4.0 - 10.5 K/uL   RBC 3.90 3.87 - 5.11 MIL/uL   Hemoglobin 9.9 (L) 12.0 - 15.0 g/dL   HCT 32.5 (  L) 36.0 - 46.0 %   MCV 83.3 80.0 - 100.0 fL   MCH 25.4 (L) 26.0 - 34.0 pg   MCHC 30.5 30.0 - 36.0 g/dL   RDW 19.9 (H) 11.5 - 15.5 %   Platelets 119 (L) 150 - 400 K/uL    Comment: SPECIMEN CHECKED FOR CLOTS   nRBC 0.0 0.0 - 0.2 %   Neutrophils Relative % 85 %   Neutro Abs 5.3 1.7 - 7.7 K/uL   Lymphocytes Relative 7 %   Lymphs Abs 0.4 (L) 0.7 - 4.0 K/uL   Monocytes Relative 7 %   Monocytes Absolute 0.4 0.1 - 1.0 K/uL   Eosinophils Relative 0 %   Eosinophils Absolute 0.0 0.0 - 0.5 K/uL   Basophils Relative 0 %   Basophils Absolute 0.0 0.0 - 0.1 K/uL   Immature Granulocytes 1 %   Abs Immature Granulocytes 0.04 0.00 - 0.07 K/uL    Comment: Performed at [facility], [address], [place], [place] [address]  Blood Culture (routine x 2)     Status: None (Preliminary result)   Collection Time: 12/23/18  8:11 PM   Specimen: BLOOD RIGHT FOREARM  Result Value Ref Range   Specimen Description BLOOD RIGHT FOREARM    Special Requests      BOTTLES DRAWN AEROBIC AND ANAEROBIC Blood Culture adequate volume Performed at [facility], [address], [place], [place] [address]    Culture PENDING    Report Status PENDING   Blood Culture (routine x 2)     Status: None (Preliminary result)   Collection Time: 12/23/18   8:20 PM   Specimen: Right Antecubital; Blood  Result Value Ref Range   Specimen Description RIGHT ANTECUBITAL    Special Requests      BOTTLES DRAWN AEROBIC ONLY Blood Culture adequate volume Performed at [facility], [address] [place], [place] [address]    Culture PENDING    Report Status PENDING   SARS Coronavirus 2 (CEPHEID- Performed in [place] hospital lab), Hosp Order     Status: None   Collection Time: 12/23/18  8:26 PM   Specimen: Nasopharyngeal Swab  Result Value Ref Range   SARS Coronavirus 2 NEGATIVE NEGATIVE    Comment: (NOTE) If result is NEGATIVE SARS-CoV-2 target nucleic acids are NOT DETECTED. The SARS-CoV-2 RNA is generally detectable in upper and lower  respiratory specimens during the acute phase of infection. The lowest  concentration of SARS-CoV-2 viral copies this assay can detect is 250  copies / mL. A negative result does not preclude SARS-CoV-2 infection  and should not be used as the sole basis for treatment or other  patient management decisions.  A negative result may occur with  improper specimen collection / handling, submission of specimen other  than nasopharyngeal swab, presence of viral mutation(s) within the  areas targeted by this assay, and inadequate number of viral copies  (<250 copies / mL). A negative result must be combined with clinical  observations, patient history, and epidemiological information. If result is POSITIVE SARS-CoV-2 target nucleic acids are DETECTED. The SARS-CoV-2 RNA is generally detectable in upper and lower  respiratory specimens dur ing the acute phase of infection.  Positive  results are indicative of active infection with SARS-CoV-2.  Clinical  correlation with patient history and other diagnostic information is  necessary to determine patient infection status.  Positive results do  not rule out bacterial infection or co-infection with other viruses. If result is PRESUMPTIVE POSTIVE SARS-CoV-2 nucleic  acids MAY BE PRESENT.   A presumptive positive result was obtained on the submitted specimen  and confirmed on repeat testing.  While 2019 novel coronavirus  (SARS-CoV-2) nucleic acids may be present in the submitted sample  additional confirmatory testing may be necessary for epidemiological  and / or clinical management purposes  to differentiate between  SARS-CoV-2 and other Sarbecovirus currently known to infect humans.  If clinically indicated additional testing with an alternate test  methodology [phone]) is advised. The SARS-CoV-2 RNA is generally  detectable in upper and lower respiratory sp ecimens during the acute  phase of infection. The expected result is Negative. Fact Sheet for Patients:  StrictlyIdeas.no Fact Sheet for Healthcare Providers: BankingDealers.co.za This test is not yet approved or cleared by the [place] FDA and has been authorized for detection and/or diagnosis of SARS-CoV-2 by FDA under an Emergency Use Authorization (EUA).  This EUA will remain in effect (meaning this test can be used) for the duration of the COVID-19 declaration under Section 564(b)(1) of the Act, 21 U.S.C. section 360bbb-3(b)(1), unless the authorization is terminated or revoked sooner. Performed at [facility], [address] [place], [place] [address]   Lactic acid, plasma     Status: None   Collection Time: 12/23/18 10:36 PM  Result Value Ref Range   Lactic Acid, Venous 0.9 0.5 - 1.9 mmol/L    Comment: Performed at [facility], [address], [place], [place] [address]    Chemistries  Recent Labs  Lab 12/23/18 2010  NA 128*  K 4.1  CL 95*  CO2 23  GLUCOSE 170*  BUN 10  CREATININE 0.68  CALCIUM 8.5*  AST 31  ALT 22  ALKPHOS 193*  BILITOT 0.7   ------------------------------------------------------------------------------------------------------------------   ------------------------------------------------------------------------------------------------------------------ GFR: Estimated Creatinine Clearance: 93 mL/min (by C-G formula based on SCr of 0.68 mg/dL). Liver Function Tests: Recent Labs  Lab 12/23/18 2010  AST 31  ALT 22  ALKPHOS 193*  BILITOT 0.7  PROT 7.0  ALBUMIN 3.2*   No results for input(s): LIPASE, AMYLASE in the last 168 hours. No results for input(s): AMMONIA in the last 168 hours. Coagulation Profile: No results for input(s): INR, PROTIME in the last 168 hours. Cardiac Enzymes: No results for input(s): CKTOTAL, CKMB, CKMBINDEX, TROPONINI in the last 168 hours. BNP (last 3 results) No results for input(s): PROBNP in the last 8760 hours. HbA1C: No results for input(s): HGBA1C in the last 72 hours. CBG: No results for input(s): GLUCAP in the last 168 hours. Lipid Profile: No results for input(s): CHOL, HDL, LDLCALC, TRIG, CHOLHDL, LDLDIRECT in the last 72 hours. Thyroid Function Tests: No results for input(s): TSH, T4TOTAL, FREET4, T3FREE, THYROIDAB in the last 72 hours. Anemia Panel: No results for input(s): VITAMINB12, FOLATE, FERRITIN, TIBC, IRON, RETICCTPCT in the last 72 hours.  --------------------------------------------------------------------------------------------------------------- Urine analysis:    Component Value Date/Time   COLORURINE STRAW (A) 12/23/2018 1936   APPEARANCEUR CLEAR 12/23/2018 1936   LABSPEC 1.006 12/23/2018 1936   PHURINE 5.0 12/23/2018 1936   GLUCOSEU NEGATIVE 12/23/2018 1936   HGBUR NEGATIVE 12/23/2018 1936   BILIRUBINUR NEGATIVE 12/23/2018 1936   KETONESUR NEGATIVE 12/23/2018 1936   PROTEINUR NEGATIVE 12/23/2018 1936   UROBILINOGEN 0.2 07/21/2014 2230   NITRITE NEGATIVE 12/23/2018 1936   LEUKOCYTESUR NEGATIVE 12/23/2018 1936      Imaging Results:    Dg Chest Port 1 View  Result Date: 12/23/2018 CLINICAL DATA:  Fever. Sepsis. EXAM: PORTABLE CHEST 1 VIEW COMPARISON:   12/04/2018 FINDINGS: Body  habitus and very low lung volumes limit assessment, patient's abdominal pannus partially obscures lung base evaluation. Unchanged heart size and mediastinal contours. Bronchovascular crowding. No dominant focal airspace disease. No visualized pneumothorax. Surgical clips at the thoracic inlet. IMPRESSION: Limited low lung volume exam. No gross acute abnormality. Consider repeat when patient is able. Electronically Signed   By: Keith Rake M.D.   On: 12/23/2018 20:46   nsr at 95, nl axis, 1st degree avb, nl qt, no st-t changes c/w ischemia   Assessment & Plan:    Principal Problem:   Fever Active Problems:   Type 2 diabetes mellitus (HCC)   Essential hypertension, benign   Atrial fibrillation (HCC)   COPD (chronic obstructive pulmonary disease) (HCC)   GERD (gastroesophageal reflux disease)   Hypothyroidism   Sleep apnea   Hyponatremia   Abnormal liver function  Fever of unclear origin ? UTI (pt was given levaquin prior to urinalysis collection) ? Early sepsis (Fever, tachycardia hr 105) Blood culture x2 Check ESR, Crp Check cardiac echo Check CT chest, abd/ pelvis Check cbc in am Vanco iv, levaquin iv pharmacy to dose  Abnormal liver function Check CT abd/ pelvis Check GGT Check cmp in am  Hyponatremia Check urine sodium, urine osm Check serum osm, cortisol, tsh Hydrate w ns iv Check cmp in am  Dm2 Cont Metformin 1029m po bid Cont Lantus 44 units [place] qday fsbs ac and qhs, ISS  Diabetic neuropathy Cont Gabapentin   Hypertension Cont Losartan 1043mpo qday  H/o lower ext edema Cont Lasix 2054mo qday  Pafib Cont Cardizem CD 360m65m qday Cont Eliquis pharmacy to dose  Hyperlipidemia Cont Pravastatin 20mg55mqhs Cont Welchol  Hypothyroidism Cont Levothyroxine   Anxiety Cont duloxetine  Cont Trazodone   Asthma/ Copd/ OSA   DVT Prophylaxis-   Eliquis  AM Labs Ordered, also please review Full Orders  Family  Communication: Admission, patients condition and plan of care including tests being ordered have been discussed with the patient  who indicate understanding and agree with the plan and Code Status.  Code Status:  FULL CODE   Admission status: Observation : Based on patients clinical presentation and evaluation of above clinical data, I have made determination that patient meets observation criteria at this time.  Time spent in minutes : 70min52m   Dimitri Dsouza Jani Graveln 12/24/2018 at 12:50 AM

## 2018-12-24 NOTE — Care Management Obs Status (Signed)
[facility] NOTIFICATION   Patient Details  Name: [name] [name] MRN: [id number] Date of Birth: 1956-05-25   Medicare Observation Status Notification Given:  Yes    Tommy Medal 12/24/2018, 2:42 PM

## 2018-12-24 NOTE — Plan of Care (Signed)
  Problem: Health Behavior/Discharge Planning: Goal: Ability to manage health-related needs will improve Outcome: Not Progressing   Problem: Activity: Goal: Risk for activity intolerance will decrease Outcome: Not Progressing   Problem: Pain Managment: Goal: General experience of comfort will improve Outcome: Not Progressing   Problem: Safety: Goal: Ability to remain free from injury will improve Outcome: Not Progressing

## 2018-12-24 NOTE — Progress Notes (Signed)
Per HPI: [name] [name]  is a 63 y.o. female, w iron def anemia, NASH, h/o thyroid cancer, hypothyroidism,  hypertension, Dm2, chronic Afib, morbid obeisity, OSA, asthma/ Copd, recent admission for sepsis due to left lower extremity cellulitis 11/07/2018 apparently presents with c/o fever.  Pt called EMS for headache, fever, T 103.7.  Pt having dry heaves en route to ER per RN notes.  Pt feels like she might be getting septic as before.   Pt denies rash, joint pain, cough, cp, palp, abd pain, diarrhea, dysuria, hematuria.   Patient was admitted with fever of unclear origin, but now appears to have gram-positive cocci in blood cultures x2 sets.  She was started empirically on Levaquin as well as vancomycin.  She continues to have shaking chills.  2D echocardiogram performed to assess for endocarditis.  Will await further ID and sensitivities and maintain on vancomycin and discontinue Levaquin for now.

## 2018-12-24 NOTE — Progress Notes (Signed)
Pharmacy Antibiotic Note  [name] [name] is a 63 y.o. female admitted on 12/23/2018 with bacteremia.  Pharmacy has been consulted for ancef dosing.  Plan: ancef 2gm iv q8h  Height: 5' 5"  (165.1 cm) Weight: 291 lb 3.6 oz (132.1 kg) IBW/kg (Calculated) : 57  Temp (24hrs), Avg:99.9 F (37.7 C), Min:97.9 F (36.6 C), Max:101.6 F (38.7 C)  Recent Labs  Lab 12/23/18 2010 12/23/18 2236 12/24/18 0611  WBC 6.2  --  5.1  CREATININE 0.68  --  0.52  LATICACIDVEN 1.9 0.9  --     Estimated Creatinine Clearance: 97.6 mL/min (by C-G formula based on SCr of 0.52 mg/dL).    Allergies  Allergen Reactions  . Doxycycline Itching  . Adhesive [Tape] Other (See Comments)    Blisters skin  . Biaxin [Clarithromycin] Other (See Comments)    Really bad yeast infection  . Percocet [Oxycodone-Acetaminophen] Itching  . Xarelto [Rivaroxaban] Itching  . Clarithromycin Rash    Yeast Infection  . Clindamycin/Lincomycin Rash    Yeast Infection   . Neosporin [Neomycin-Polymyxin B Gu] Rash    Eyes Drops only  . Penicillins Rash    Has patient had a PCN reaction causing immediate rash, facial/tongue/throat swelling, SOB or lightheadedness with hypotension: Yes Has patient had a PCN reaction causing severe rash involving mucus membranes or skin necrosis: No Has patient had a PCN reaction that required hospitalization: No Has patient had a PCN reaction occurring within the last 10 years: No If all of the above answers are "NO", then may proceed with Cephalosporin use.     Antimicrobials this admission: 7/6 ancef >>  7/6 vancomycin x 1  Microbiology results: 7/6 BCx: Staph species +, MSSA detected  7/6 Covid 19 negative   Thank you for allowing pharmacy to be a part of this patient's care.  Donna Christen Ladarrius Bogdanski 12/24/2018 7:18 PM

## 2018-12-25 ENCOUNTER — Telehealth (HOSPITAL_COMMUNITY): Payer: Self-pay | Admitting: Internal Medicine

## 2018-12-25 ENCOUNTER — Ambulatory Visit (HOSPITAL_COMMUNITY): Payer: PPO | Admitting: Physical Therapy

## 2018-12-25 ENCOUNTER — Encounter (HOSPITAL_COMMUNITY): Payer: PPO

## 2018-12-25 DIAGNOSIS — J449 Chronic obstructive pulmonary disease, unspecified: Secondary | ICD-10-CM | POA: Diagnosis present

## 2018-12-25 DIAGNOSIS — E871 Hypo-osmolality and hyponatremia: Secondary | ICD-10-CM | POA: Diagnosis present

## 2018-12-25 DIAGNOSIS — N390 Urinary tract infection, site not specified: Secondary | ICD-10-CM | POA: Diagnosis present

## 2018-12-25 DIAGNOSIS — I480 Paroxysmal atrial fibrillation: Secondary | ICD-10-CM | POA: Diagnosis present

## 2018-12-25 DIAGNOSIS — E890 Postprocedural hypothyroidism: Secondary | ICD-10-CM | POA: Diagnosis present

## 2018-12-25 DIAGNOSIS — I340 Nonrheumatic mitral (valve) insufficiency: Secondary | ICD-10-CM | POA: Diagnosis not present

## 2018-12-25 DIAGNOSIS — Z825 Family history of asthma and other chronic lower respiratory diseases: Secondary | ICD-10-CM | POA: Diagnosis not present

## 2018-12-25 DIAGNOSIS — I361 Nonrheumatic tricuspid (valve) insufficiency: Secondary | ICD-10-CM | POA: Diagnosis not present

## 2018-12-25 DIAGNOSIS — Z7901 Long term (current) use of anticoagulants: Secondary | ICD-10-CM | POA: Diagnosis not present

## 2018-12-25 DIAGNOSIS — E662 Morbid (severe) obesity with alveolar hypoventilation: Secondary | ICD-10-CM | POA: Diagnosis present

## 2018-12-25 DIAGNOSIS — Z794 Long term (current) use of insulin: Secondary | ICD-10-CM | POA: Diagnosis not present

## 2018-12-25 DIAGNOSIS — I10 Essential (primary) hypertension: Secondary | ICD-10-CM | POA: Diagnosis present

## 2018-12-25 DIAGNOSIS — F329 Major depressive disorder, single episode, unspecified: Secondary | ICD-10-CM | POA: Diagnosis present

## 2018-12-25 DIAGNOSIS — Z85850 Personal history of malignant neoplasm of thyroid: Secondary | ICD-10-CM | POA: Diagnosis not present

## 2018-12-25 DIAGNOSIS — R600 Localized edema: Secondary | ICD-10-CM | POA: Diagnosis present

## 2018-12-25 DIAGNOSIS — K7460 Unspecified cirrhosis of liver: Secondary | ICD-10-CM | POA: Diagnosis present

## 2018-12-25 DIAGNOSIS — A4101 Sepsis due to Methicillin susceptible Staphylococcus aureus: Secondary | ICD-10-CM | POA: Diagnosis present

## 2018-12-25 DIAGNOSIS — E1140 Type 2 diabetes mellitus with diabetic neuropathy, unspecified: Secondary | ICD-10-CM | POA: Diagnosis present

## 2018-12-25 DIAGNOSIS — K219 Gastro-esophageal reflux disease without esophagitis: Secondary | ICD-10-CM | POA: Diagnosis present

## 2018-12-25 DIAGNOSIS — R509 Fever, unspecified: Secondary | ICD-10-CM | POA: Diagnosis present

## 2018-12-25 DIAGNOSIS — E785 Hyperlipidemia, unspecified: Secondary | ICD-10-CM | POA: Diagnosis present

## 2018-12-25 DIAGNOSIS — I371 Nonrheumatic pulmonary valve insufficiency: Secondary | ICD-10-CM | POA: Diagnosis not present

## 2018-12-25 DIAGNOSIS — Z6841 Body Mass Index (BMI) 40.0 and over, adult: Secondary | ICD-10-CM | POA: Diagnosis not present

## 2018-12-25 DIAGNOSIS — Z79899 Other long term (current) drug therapy: Secondary | ICD-10-CM | POA: Diagnosis not present

## 2018-12-25 DIAGNOSIS — Z20828 Contact with and (suspected) exposure to other viral communicable diseases: Secondary | ICD-10-CM | POA: Diagnosis present

## 2018-12-25 DIAGNOSIS — Z79891 Long term (current) use of opiate analgesic: Secondary | ICD-10-CM | POA: Diagnosis not present

## 2018-12-25 DIAGNOSIS — M1990 Unspecified osteoarthritis, unspecified site: Secondary | ICD-10-CM | POA: Diagnosis present

## 2018-12-25 DIAGNOSIS — F419 Anxiety disorder, unspecified: Secondary | ICD-10-CM | POA: Diagnosis present

## 2018-12-25 LAB — CBC
HCT: 33.6 % — ABNORMAL LOW (ref 36.0–46.0)
Hemoglobin: 10.4 g/dL — ABNORMAL LOW (ref 12.0–15.0)
MCH: 25.7 pg — ABNORMAL LOW (ref 26.0–34.0)
MCHC: 31 g/dL (ref 30.0–36.0)
MCV: 83.2 fL (ref 80.0–100.0)
Platelets: 105 10*3/uL — ABNORMAL LOW (ref 150–400)
RBC: 4.04 MIL/uL (ref 3.87–5.11)
RDW: 19.9 % — ABNORMAL HIGH (ref 11.5–15.5)
WBC: 5 10*3/uL (ref 4.0–10.5)
nRBC: 0 % (ref 0.0–0.2)

## 2018-12-25 LAB — BASIC METABOLIC PANEL
Anion gap: 9 (ref 5–15)
BUN: 8 mg/dL (ref 8–23)
CO2: 24 mmol/L (ref 22–32)
Calcium: 8.4 mg/dL — ABNORMAL LOW (ref 8.9–10.3)
Chloride: 97 mmol/L — ABNORMAL LOW (ref 98–111)
Creatinine, Ser: 0.64 mg/dL (ref 0.44–1.00)
GFR calc Af Amer: >60 mL/min (ref >60)
GFR calc non Af Amer: >60 mL/min (ref >60)
Glucose, Bld: 131 mg/dL — ABNORMAL HIGH (ref 70–99)
Potassium: 3.9 mmol/L (ref 3.5–5.1)
Sodium: 130 mmol/L — ABNORMAL LOW (ref 135–145)

## 2018-12-25 LAB — GLUCOSE, CAPILLARY
Glucose-Capillary: 138 mg/dL — ABNORMAL HIGH (ref 70–99)
Glucose-Capillary: 146 mg/dL — ABNORMAL HIGH (ref 70–99)
Glucose-Capillary: 128 mg/dL — ABNORMAL HIGH (ref 70–99)
Glucose-Capillary: 115 mg/dL — ABNORMAL HIGH (ref 70–99)

## 2018-12-25 LAB — MAGNESIUM: Magnesium: 1.2 mg/dL — ABNORMAL LOW (ref 1.7–2.4)

## 2018-12-25 MED ORDER — NYSTATIN 100000 UNIT/GM EX POWD
Freq: Every day | CUTANEOUS | Status: DC | PRN
  Filled 2018-12-25: qty 15

## 2018-12-25 MED ORDER — SODIUM CHLORIDE BACTERIOSTATIC 0.9 % IJ SOLN
INTRAMUSCULAR | Status: AC
  Filled 2018-12-25: qty 20

## 2018-12-25 MED ORDER — MAGNESIUM SULFATE 2 GM/50ML IV SOLN
2.0000 g | Freq: Once | INTRAVENOUS | Status: AC
  Administered 2018-12-25: 2 g via INTRAVENOUS
  Filled 2018-12-25: qty 50

## 2018-12-25 NOTE — Progress Notes (Signed)
PROGRESS NOTE    [name] [name]  GEX:[id number] DOB: 09-30-1955 DOA: 12/23/2018 PCP: Redmond School, MD   Brief Narrative:  Per HPI: [name] [name] a40 y.o.female,w iron def anemia, NASH, h/o thyroid cancer, hypothyroidism, hypertension, Dm2, chronic Afib, morbid obeisity, OSA, asthma/ Copd, recent admission for sepsis due to left lower extremity cellulitis 11/07/2018 apparently presents with c/o fever. Pt called EMS for headache, fever, T 103.7. Pt having dry heaves en route to ER per RN notes. Pt feels like she might be getting septic as before.   Pt denies rash, joint pain, cough, cp, palp, abd pain, diarrhea, dysuria, hematuria.   Patient was admitted with fever of unclear origin, but now is noted to have MSSA bacteremia and has been switched from vancomycin to Ancef overnight.  Transthoracic echocardiogram on 7/6 did not demonstrate any vegetations.  Cardiology consulted and will pursue TEE in a.m.  Assessment & Plan:   Principal Problem:   Fever Active Problems:   Type 2 diabetes mellitus (HCC)   Essential hypertension, benign   Atrial fibrillation (HCC)   COPD (chronic obstructive pulmonary disease) (HCC)   GERD (gastroesophageal reflux disease)   Hypothyroidism   Sleep apnea   Hyponatremia   Abnormal liver function   Fever secondary to MSSA bacteremia -Appreciate cardiology for TEE in a.m. to evaluate for endocarditis -Continue on Ancef -Repeat blood cultures ordered and pending -Repeat CBC in a.m. and monitor fever curve  Abnormal liver function-resolved -CT abdomen and pelvis with no acute findings aside from high colonic stool burden -We will place on MiraLAX daily for bowel regimen  Hyponatremia-stable -Cortisol and TSH stable -Continue to monitor for now and fluid restrict  Type 2 diabetes -Continue metformin and Lantus as well as sliding scale insulin with good control noted  Diabetic neuropathy -Continue gabapentin  Hypertension-stable  -Continue losartan  Lower extremity edema -Continue Lasix daily  Paroxysmal atrial fibrillation -Continue Cardizem daily  Dyslipidemia -Continue pravastatin and WelChol  Hypothyroidism -TSH stable as noted above -Continue levothyroxine  Anxiety -Continue duloxetine and trazodone  Asthma/COPD/OSA   DVT prophylaxis: Eliquis Code Status: Full Family Communication: Patient will communicate with family Disposition Plan: Anticipate TEE in a.m. and potential discharge in the next 48 hours once stable   Consultants:   Cardiology  Procedures:   None  Antimicrobials:  Anti-infectives (From admission, onward)   Start     Dose/Rate Route Frequency Ordered Stop   12/24/18 1930  ceFAZolin (ANCEF) IVPB 2g/100 mL premix     2 g 200 mL/hr over 30 Minutes Intravenous Every 8 hours 12/24/18 1918     12/24/18 1615  vancomycin (VANCOCIN) 1,250 mg in sodium chloride 0.9 % 250 mL IVPB  Status:  Discontinued     1,250 mg 166.7 mL/hr over 90 Minutes Intravenous Every 12 hours 12/24/18 0208 12/24/18 1912   12/24/18 0600  ceFEPIme (MAXIPIME) 2 g in sodium chloride 0.9 % 100 mL IVPB  Status:  Discontinued     2 g 200 mL/hr over 30 Minutes Intravenous Every 8 hours 12/24/18 0205 12/24/18 1913   12/24/18 0415  vancomycin (VANCOCIN) IVPB 1000 mg/200 mL premix  Status:  Discontinued     1,000 mg 200 mL/hr over 60 Minutes Intravenous  Once 12/24/18 0207 12/24/18 0220   12/24/18 0230  vancomycin (VANCOCIN) 1,500 mg in sodium chloride 0.9 % 500 mL IVPB     1,500 mg 250 mL/hr over 120 Minutes Intravenous  Once 12/24/18 0219 12/24/18 0608   12/24/18 0230  vancomycin (VANCOCIN) IVPB 1000 mg/200  mL premix     1,000 mg 200 mL/hr over 60 Minutes Intravenous  Once 12/24/18 0219 12/24/18 0414   12/24/18 0215  vancomycin (VANCOCIN) 1,500 mg in sodium chloride 0.9 % 500 mL IVPB  Status:  Discontinued     1,500 mg 250 mL/hr over 120 Minutes Intravenous  Once 12/24/18 0206 12/24/18 0220   12/23/18 1945   levofloxacin (LEVAQUIN) IVPB 500 mg     500 mg 100 mL/hr over 60 Minutes Intravenous  Once 12/23/18 1938 12/23/18 2156       Subjective: Patient seen and evaluated today with no new acute complaints or concerns. No acute concerns or events noted overnight.  She does not have shaking chills any longer and is having some low back pain and would like a heating pad.  Objective: Vitals:   12/24/18 0629 12/24/18 1400 12/24/18 2217 12/25/18 0526  BP: (!) 174/75 (!) 163/81 (!) 177/86 (!) 162/75  Pulse: 89 91 95 93  Resp: 18 18 20 17   Temp: 98.7 F (37.1 C) 97.9 F (36.6 C) 99.3 F (37.4 C) 99.5 F (37.5 C)  TempSrc: Oral Other (Comment) Oral Oral  SpO2: 99% 100% 100% 97%  Weight: 132.1 kg   130.2 kg  Height:        Intake/Output Summary (Last 24 hours) at 12/25/2018 1035 Last data filed at 12/25/2018 0900 Gross per 24 hour  Intake 960 ml  Output 700 ml  Net 260 ml   Filed Weights   12/24/18 0248 12/24/18 0629 12/25/18 0526  Weight: 133 kg 132.1 kg 130.2 kg    Examination:  General exam: Appears calm and comfortable, obese Respiratory system: Clear to auscultation. Respiratory effort normal. Cardiovascular system: S1 & S2 heard, RRR. No JVD, murmurs, rubs, gallops or clicks. No pedal edema. Gastrointestinal system: Abdomen is nondistended, soft and nontender. No organomegaly or masses felt. Normal bowel sounds heard. Central nervous system: Alert and oriented. No focal neurological deficits. Extremities: Symmetric 5 x 5 power. Skin: No rashes, lesions or ulcers Psychiatry: Judgement and insight appear normal. Mood & affect appropriate.     Data Reviewed: I have personally reviewed following labs and imaging studies  CBC: Recent Labs  Lab 12/23/18 2010 12/24/18 0611 12/25/18 0611  WBC 6.2 5.1 5.0  NEUTROABS 5.3  --   --   HGB 9.9* 9.8* 10.4*  HCT 32.5* 31.1* 33.6*  MCV 83.3 81.6 83.2  PLT 119* 102* 144*   Basic Metabolic Panel: Recent Labs  Lab 12/23/18 2010  12/24/18 0611 12/25/18 0611  NA 128* 130* 130*  K 4.1 3.2* 3.9  CL 95* 100 97*  CO2 23 23 24   GLUCOSE 170* 165* 131*  BUN 10 7* 8  CREATININE 0.68 0.52 0.64  CALCIUM 8.5* 8.0* 8.4*  MG  --   --  1.2*   GFR: Estimated Creatinine Clearance: 96.8 mL/min (by C-G formula based on SCr of 0.64 mg/dL). Liver Function Tests: Recent Labs  Lab 12/23/18 2010 12/24/18 0611  AST 31 24  ALT 22 19  ALKPHOS 193* 162*  BILITOT 0.7 0.9  PROT 7.0 6.1*  ALBUMIN 3.2* 2.6*   No results for input(s): LIPASE, AMYLASE in the last 168 hours. No results for input(s): AMMONIA in the last 168 hours. Coagulation Profile: No results for input(s): INR, PROTIME in the last 168 hours. Cardiac Enzymes: No results for input(s): CKTOTAL, CKMB, CKMBINDEX, TROPONINI in the last 168 hours. BNP (last 3 results) No results for input(s): PROBNP in the last 8760 hours. HbA1C: No  results for input(s): HGBA1C in the last 72 hours. CBG: Recent Labs  Lab 12/24/18 0718 12/24/18 1139 12/24/18 1646 12/24/18 2216 12/25/18 0758  GLUCAP 142* 141* 109* 122* 138*   Lipid Profile: No results for input(s): CHOL, HDL, LDLCALC, TRIG, CHOLHDL, LDLDIRECT in the last 72 hours. Thyroid Function Tests: Recent Labs    12/24/18 0611  TSH 2.298   Anemia Panel: No results for input(s): VITAMINB12, FOLATE, FERRITIN, TIBC, IRON, RETICCTPCT in the last 72 hours. Sepsis Labs: Recent Labs  Lab 12/23/18 2010 12/23/18 2236 12/24/18 0611  PROCALCITON  --   --  0.10  LATICACIDVEN 1.9 0.9  --     Recent Results (from the past 240 hour(s))  Blood Culture (routine x 2)     Status: Abnormal (Preliminary result)   Collection Time: 12/23/18  8:11 PM   Specimen: BLOOD  Result Value Ref Range Status   Specimen Description   Final    BLOOD RIGHT ANTECUBITAL Performed at [facility], [place] [address], [place], [place] [address]    Special Requests   Final    BOTTLES DRAWN AEROBIC AND ANAEROBIC Blood Culture adequate volume  Performed at [place] [place], [place] [address]    Culture  Setup Time   Final    GRAM POSITIVE COCCI AEROBIC BOTTLE ONLY Gram Stain Report Called to,Read Back By and Verified With: MCMILLIAN B. AT 1020A ON [id number] BY THOMPSON S. ANAEROBIC BOTTLE [facility] PREVIOUSLY CALLED MATTHEWS, B 7.6.2020 CRITICAL VALUE NOTED.  VALUE IS CONSISTENT WITH PREVIOUSLY REPORTED AND CALLED VALUE. Performed at [facility], [place] [address], [place], [place] [address]    Culture STAPHYLOCOCCUS AUREUS (A)  Final   Report Status PENDING  Incomplete  Blood Culture (routine x 2)     Status: Abnormal (Preliminary result)   Collection Time: 12/23/18  8:20 PM   Specimen: BLOOD RIGHT ARM  Result Value Ref Range Status   Specimen Description   Final    BLOOD RIGHT ARM Performed at [facility], [place] [address] [place], [place] [address]    Special Requests   Final    BOTTLES DRAWN AEROBIC ONLY Blood Culture adequate volume Performed at [facility], [address], [place], [place] [address]    Culture  Setup Time   Final    GRAM POSITIVE COCCI AEROBIC BOTTLE Gram Stain Report Called to,Read Back By and Verified With: MCMILLIAN,B@1114  BY MATTHEWS, B 7.6.2020 [facility] Organism ID to follow CRITICAL RESULT CALLED TO, READ BACK BY AND VERIFIED WITH: B MCMILLIAN RN 12/24/18 1856 JDW    Culture (A)  Final    STAPHYLOCOCCUS AUREUS SUSCEPTIBILITIES TO FOLLOW Performed at [facility], [place] [address] [place], [place] [address]    Report Status PENDING  Incomplete  Blood Culture ID Panel (Reflexed)     Status: Abnormal   Collection Time: 12/23/18  8:20 PM  Result Value Ref Range Status   Enterococcus species NOT DETECTED NOT DETECTED Final   Listeria monocytogenes NOT DETECTED NOT DETECTED Final   Staphylococcus species DETECTED (A) NOT DETECTED Final    Comment: CRITICAL RESULT CALLED TO, READ BACK BY AND VERIFIED WITH: B MCMILLIAN RN 12/24/18 1856 JDW     Staphylococcus aureus (BCID) DETECTED (A) NOT DETECTED Final    Comment: Methicillin (oxacillin) susceptible Staphylococcus aureus (MSSA). Preferred therapy is anti staphylococcal beta lactam antibiotic (Cefazolin or Nafcillin), unless clinically contraindicated. CRITICAL RESULT CALLED TO, READ BACK BY AND VERIFIED WITH: B  [facility] 12/24/18 1856 JDW    Methicillin resistance NOT DETECTED NOT DETECTED Final   Streptococcus species NOT DETECTED NOT DETECTED Final   Streptococcus agalactiae NOT DETECTED NOT DETECTED Final   Streptococcus pneumoniae NOT DETECTED NOT DETECTED Final   Streptococcus pyogenes NOT DETECTED NOT DETECTED Final   Acinetobacter baumannii NOT DETECTED NOT DETECTED Final   Enterobacteriaceae species NOT DETECTED NOT DETECTED Final   Enterobacter cloacae complex NOT DETECTED NOT DETECTED Final   Escherichia coli NOT DETECTED NOT DETECTED Final   Klebsiella oxytoca NOT DETECTED NOT DETECTED Final   Klebsiella pneumoniae NOT DETECTED NOT DETECTED Final   Proteus species NOT DETECTED NOT DETECTED Final   Serratia marcescens NOT DETECTED NOT DETECTED Final   Haemophilus influenzae NOT DETECTED NOT DETECTED Final   Neisseria meningitidis NOT DETECTED NOT DETECTED Final   Pseudomonas aeruginosa NOT DETECTED NOT DETECTED Final   Candida albicans NOT DETECTED NOT DETECTED Final   Candida glabrata NOT DETECTED NOT DETECTED Final   Candida krusei NOT DETECTED NOT DETECTED Final   Candida parapsilosis NOT DETECTED NOT DETECTED Final   Candida tropicalis NOT DETECTED NOT DETECTED Final    Comment: Performed at [facility], [place] [address], [place], [place] [address]  SARS Coronavirus 2 (CEPHEID- Performed in [facility]), Hosp Order     Status: None   Collection Time: 12/23/18  8:26 PM   Specimen: Nasopharyngeal Swab  Result Value Ref Range Status   SARS Coronavirus 2 NEGATIVE NEGATIVE Final    Comment: (NOTE) If result is NEGATIVE SARS-CoV-2 target nucleic  acids are NOT DETECTED. The SARS-CoV-2 RNA is generally detectable in upper and lower  respiratory specimens during the acute phase of infection. The lowest  concentration of SARS-CoV-2 viral copies this assay can detect is 250  copies / mL. A negative result does not preclude SARS-CoV-2 infection  and should not be used as the sole basis for treatment or other  patient management decisions.  A negative result may occur with  improper specimen collection / handling, submission of specimen other  than nasopharyngeal swab, presence of viral mutation(s) within the  areas targeted by this assay, and inadequate number of viral copies  (<250 copies / mL). A negative result must be combined with clinical  observations, patient history, and epidemiological information. If result is POSITIVE SARS-CoV-2 target nucleic acids are DETECTED. The SARS-CoV-2 RNA is generally detectable in upper and lower  respiratory specimens dur ing the acute phase of infection.  Positive  results are indicative of active infection with SARS-CoV-2.  Clinical  correlation with patient history and other diagnostic information is  necessary to determine patient infection status.  Positive results do  not rule out bacterial infection or co-infection with other viruses. If result is PRESUMPTIVE POSTIVE SARS-CoV-2 nucleic acids MAY BE PRESENT.   A presumptive positive result was obtained on the submitted specimen  and confirmed on repeat testing.  While 2019 novel coronavirus  (SARS-CoV-2) nucleic acids may be present in the submitted sample  additional confirmatory testing may be necessary for epidemiological  and / or clinical management purposes  to differentiate between  SARS-CoV-2 and other Sarbecovirus currently known to infect humans.  If clinically indicated additional testing with an alternate test  methodology [phone]) is advised. The SARS-CoV-2 RNA is generally  detectable in upper and lower respiratory sp  ecimens during the acute  phase of infection. The expected result is Negative. Fact Sheet for Patients:  StrictlyIdeas.no Fact Sheet for Healthcare Providers:  BankingDealers.co.za This test is not yet approved or cleared by the [place] and has been authorized for detection and/or diagnosis of SARS-CoV-2 by FDA under an Emergency Use Authorization (EUA).  This EUA will remain in effect (meaning this test can be used) for the duration of the COVID-19 declaration under Section 564(b)(1) of the Act, 21 U.S.C. section 360bbb-3(b)(1), unless the authorization is terminated or revoked sooner. Performed at [facility], [address], [place], [place] [address]   Culture, blood (routine x 2)     Status: None (Preliminary result)   Collection Time: 12/25/18  8:38 AM   Specimen: BLOOD RIGHT ARM  Result Value Ref Range Status   Specimen Description BLOOD RIGHT ARM  Final   Special Requests   Final    BOTTLES DRAWN AEROBIC AND ANAEROBIC Blood Culture adequate volume Performed at [facility], [address] [place], [place] [address]    Culture PENDING  Incomplete   Report Status PENDING  Incomplete  Culture, blood (routine x 2)     Status: None (Preliminary result)   Collection Time: 12/25/18  8:39 AM   Specimen: BLOOD RIGHT FOREARM  Result Value Ref Range Status   Specimen Description BLOOD RIGHT FOREARM  Final   Special Requests   Final    BOTTLES DRAWN AEROBIC AND ANAEROBIC Blood Culture adequate volume Performed at [facility], [address], [place],  [address]    Culture PENDING  Incomplete   Report Status PENDING  Incomplete         Radiology Studies: Ct Chest W Contrast  Result Date: 12/24/2018 CLINICAL DATA:  Fever of unknown origin. Elevation in alkaline phosphatase. EXAM: CT CHEST, ABDOMEN, AND PELVIS WITH CONTRAST TECHNIQUE: Multidetector CT imaging of the chest, abdomen and pelvis was performed following  the standard protocol during bolus administration of intravenous contrast. CONTRAST:  110m OMNIPAQUE IOHEXOL 300 MG/ML  SOLN COMPARISON:  Radiograph yesterday. Chest CT 11/11/2011. Abdominal CT 06/14/2017 FINDINGS: CT CHEST FINDINGS Cardiovascular: Aortic atherosclerosis without aneurysm or dissection. No central pulmonary embolus to the lobar level. Mild cardiomegaly with coronary artery calcifications. No pericardial effusion. Mediastinum/Nodes: No enlarged mediastinal or hilar lymph nodes. Decompressed esophagus. Post thyroidectomy. Lungs/Pleura: Minor hypoventilatory change at the bases. No consolidation to suggest pneumonia. No pulmonary edema or pleural fluid. Trachea and mainstem bronchi are patent. Musculoskeletal: There are no acute or suspicious osseous abnormalities. Small bone island within T5. CT ABDOMEN PELVIS FINDINGS Hepatobiliary: Prominent liver spanning 18.5 cm. No focal abnormality. No capsular nodularity as before. Clips in the gallbladder fossa postcholecystectomy. No biliary dilatation. Pancreas: Parenchymal atrophy. No ductal dilatation or inflammation. Spleen: Splenomegaly with spleen spanning 16 cm cranial caudal and 17.4 cm AP, slight increase from prior. No focal abnormality. Adrenals/Urinary Tract: No adrenal nodule. No hydronephrosis or perinephric edema. Homogeneous renal enhancement with symmetric excretion on delayed phase imaging. Urinary bladder is physiologically distended without wall thickening. Stomach/Bowel: Colonic redundancy with large colonic stool burden. No colonic or bowel wall thickening. No bowel inflammation. Ventral abdominal hernia contains noninflamed nonobstructed small bowel. No obstruction. Stomach is nondistended. Appendix not visualized. Vascular/Lymphatic: Mild aortic atherosclerosis. No aneurysm. Patent portal vein. 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 Small retroperitoneal nodes are all subcentimeter. Enlarged right  external iliac node measures 16 mm, this is unchanged from prior exam. Enlarged bilateral inguinal nodes with slight increase. Prominent bilateral external iliac nodes. Reproductive: Status post hysterectomy. No adnexal masses. Other: Bilateral subcutaneous flank edema. Lower anterior abdominal pannus edema with skin  thickening inferiorly. No focal subcutaneous collection. No soft tissue air. No free air free fluid in the abdomen or pelvis. Musculoskeletal: There are no acute or suspicious osseous abnormalities. IMPRESSION: 1. Lower anterior abdominal pannus edema with skin thickening inferiorly. This may be due to edema or cellulitis. No abscess or soft tissue air. 2. No other acute findings or explanation for fever in the chest, abdomen, or pelvis. 3. Mild hepatosplenomegaly. Nodular hepatic contours suggest cirrhosis. 4. Mild bilateral inguinal and external iliac adenopathy, likely reactive. 5. Lower abdominal wall hernia containing nonobstructed noninflamed small bowel. Large colonic stool burden with colonic redundancy, suggesting constipation. Aortic Atherosclerosis (ICD10-I70.0). Electronically Signed   By: Keith Rake M.D.   On: 12/24/2018 02:36   Ct Abdomen Pelvis W Contrast  Result Date: 12/24/2018 CLINICAL DATA:  Fever of unknown origin. Elevation in alkaline phosphatase. EXAM: CT CHEST, ABDOMEN, AND PELVIS WITH CONTRAST TECHNIQUE: Multidetector CT imaging of the chest, abdomen and pelvis was performed following the standard protocol during bolus administration of intravenous contrast. CONTRAST:  15m OMNIPAQUE IOHEXOL 300 MG/ML  SOLN COMPARISON:  Radiograph yesterday. Chest CT 11/11/2011. Abdominal CT 06/14/2017 FINDINGS: CT CHEST FINDINGS Cardiovascular: Aortic atherosclerosis without aneurysm or dissection. No central pulmonary embolus to the lobar level. Mild cardiomegaly with coronary artery calcifications. No pericardial effusion. Mediastinum/Nodes: No enlarged mediastinal or hilar lymph  nodes. Decompressed esophagus. Post thyroidectomy. Lungs/Pleura: Minor hypoventilatory change at the bases. No consolidation to suggest pneumonia. No pulmonary edema or pleural fluid. Trachea and mainstem bronchi are patent. Musculoskeletal: There are no acute or suspicious osseous abnormalities. Small bone island within T5. CT ABDOMEN PELVIS FINDINGS Hepatobiliary: Prominent liver spanning 18.5 cm. No focal abnormality. No capsular nodularity as before. Clips in the gallbladder fossa postcholecystectomy. No biliary dilatation. Pancreas: Parenchymal atrophy. No ductal dilatation or inflammation. Spleen: Splenomegaly with spleen spanning 16 cm cranial caudal and 17.4 cm AP, slight increase from prior. No focal abnormality. Adrenals/Urinary Tract: No adrenal nodule. No hydronephrosis or perinephric edema. Homogeneous renal enhancement with symmetric excretion on delayed phase imaging. Urinary bladder is physiologically distended without wall thickening. Stomach/Bowel: Colonic redundancy with large colonic stool burden. No colonic or bowel wall thickening. No bowel inflammation. Ventral abdominal hernia contains noninflamed nonobstructed small bowel. No obstruction. Stomach is nondistended. Appendix not visualized. Vascular/Lymphatic: Mild aortic atherosclerosis. No aneurysm. Patent portal vein. 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 Small retroperitoneal nodes are all subcentimeter. Enlarged right external iliac node measures 16 mm, this is unchanged from prior exam. Enlarged bilateral inguinal nodes with slight increase. Prominent bilateral external iliac nodes. Reproductive: Status post hysterectomy. No adnexal masses. Other: Bilateral subcutaneous flank edema. Lower anterior abdominal pannus edema with skin thickening inferiorly. No focal subcutaneous collection. No soft tissue air. No free air free fluid in the abdomen or pelvis. Musculoskeletal: There are no acute or  suspicious osseous abnormalities. IMPRESSION: 1. Lower anterior abdominal pannus edema with skin thickening inferiorly. This may be due to edema or cellulitis. No abscess or soft tissue air. 2. No other acute findings or explanation for fever in the chest, abdomen, or pelvis. 3. Mild hepatosplenomegaly. Nodular hepatic contours suggest cirrhosis. 4. Mild bilateral inguinal and external iliac adenopathy, likely reactive. 5. Lower abdominal wall hernia containing nonobstructed noninflamed small bowel. Large colonic stool burden with colonic redundancy, suggesting constipation. Aortic Atherosclerosis (ICD10-I70.0). Electronically Signed   By: MKeith RakeM.D.   On: 12/24/2018 02:36   Dg Chest Port 1 View  Result Date: 12/23/2018 CLINICAL DATA:  Fever. Sepsis. EXAM:  PORTABLE CHEST 1 VIEW COMPARISON:  12/04/2018 FINDINGS: Body habitus and very low lung volumes limit assessment, patient's abdominal pannus partially obscures lung base evaluation. Unchanged heart size and mediastinal contours. Bronchovascular crowding. No dominant focal airspace disease. No visualized pneumothorax. Surgical clips at the thoracic inlet. IMPRESSION: Limited low lung volume exam. No gross acute abnormality. Consider repeat when patient is able. Electronically Signed   By: Keith Rake M.D.   On: 12/23/2018 20:46        Scheduled Meds: . colesevelam  1,875 mg Oral BID  . diltiazem  360 mg Oral Daily  . DULoxetine  90 mg Oral Daily  . fesoterodine  4 mg Oral Daily  . gabapentin  300 mg Oral Daily  . insulin aspart  0-5 Units Subcutaneous QHS  . insulin aspart  0-9 Units Subcutaneous TID WC  . insulin glargine  44 Units Subcutaneous QHS  . levothyroxine  200 mcg Oral QAC breakfast  . levothyroxine  75 mcg Oral QAC breakfast  . losartan  100 mg Oral Daily  . metFORMIN  1,000 mg Oral BID WC  . nystatin cream  1 application Topical TID  . potassium chloride SA  40 mEq Oral BID  . pravastatin  20 mg Oral Daily  .  traZODone  100-200 mg Oral QHS   Continuous Infusions: .  ceFAZolin (ANCEF) IV 2 g (12/25/18 0626)     LOS: 0 days    Time spent: 30 minutes    Bonny Vanleeuwen Darleen Crocker, DO [facility] Pager [phone]  If 7PM-7AM, please contact night-coverage [email or web address] Password [email or web address] 12/25/2018, 10:35 AM

## 2018-12-25 NOTE — H&P (Signed)
Procedure H&P  Contacted by primary team for TEE in setting of bacteremia, limited visualization of valves with standard TTE. Please see referenced admission H&P below for full history. Due to her obesity and history of OSA and COPD we will ask for assistance from anestehsiology. Plan for TEE tomorrow at 54 AM    Zandra Abts MD    H&P  Patient Demographics:  [name] [name], is a 63 y.o. female MRN: [id number] DOB - January 16, 1956  Admit Date - 12/23/2018  Referring MD/NP/PA: Denton Meek  Outpatient Primary MD for the patient is Redmond School, MD  Patient coming from: home  Chief complaint- fever  HPI:  [name] [name] is a 63 y.o. female, w iron def anemia, NASH, h/o thyroid cancer, hypothyroidism, hypertension, Dm2, chronic Afib, morbid obeisity, OSA, asthma/ Copd, recent admission for sepsis due to left lower extremity cellulitis 11/07/2018 apparently presents with c/o fever. Pt called EMS for headache, fever, T 103.7. Pt having dry heaves en route to ER per RN notes. Pt feels like she might be getting septic as before.  Pt denies rash, joint pain, cough, cp, palp, abd pain, diarrhea, dysuria, hematuria.  In ED,  T 101.6 P 88, as high as 105, R 18, Bp 166/62 pox 98% on RA  Na 128, K 4.1, Bun 10, Creatinine 0.68  Alb 3.2  Glucose 170  Ast 31, Alt 22, Alk phos 193, T. Bili 0.7  Urinalysis negative  Lactic acid 1.9-> 0.9  Blood culture x2 pending  Covid 19 negative  pt received levaquin prior to urine being collected.  Pt will be admitted for fever of unclear source.  Review of systems:  In addition to the HPI above,  Not unusual to have headache per patient  No changes with Vision or hearing,  No problems swallowing food or Liquids,  No Chest pain, Cough or Shortness of Breath,  No Abdominal pain, No Nausea or Vomiting, bowel movements are regular,  No Blood in stool or Urine,  No dysuria,  No new skin rashes or bruises,  No new joints pains-aches,  No new weakness, tingling,  numbness in any extremity,  No recent weight gain or loss,  No polyuria, polydypsia or polyphagia,  No significant Mental Stressors.  All other systems reviewed and are negative.  Past History of the following :       Past Medical History:  Diagnosis Date  . Anemia   . Anxiety   . Arthritis   . Asthma   . Atrial fibrillation ([place])   . Cirrhosis of liver ([place])   . COPD (chronic obstructive pulmonary disease) ([place])   . Depression   . Edema   . Essential hypertension, benign   . GERD (gastroesophageal reflux disease)   . Hypothyroidism   . Morbid obesity ([place])   . Overactive bladder   . Sleep apnea    CPAP - not consistent  . Thyroid cancer ([place])   . Type 2 diabetes mellitus ([place])         Past Surgical History:  Procedure Laterality Date  . "pump bumps"     bilateral heels--2000  . ABDOMINAL HYSTERECTOMY  1985  . CARDIAC CATHETERIZATION  2012   Dr. Lia Foyer told her nothing was wrong.  [name] [place]  . CHOLECYSTECTOMY  1996  . COLONOSCOPY WITH PROPOFOL N/A 04/17/2015   Procedure: COLONOSCOPY WITH PROPOFOL at cecum at 0810; withdrawal time =15 minutes; Surgeon: [name], MD; Location: [facility]; Service: Endoscopy; Laterality: N/A;  . Debridement  of abdominal wound  2011  . ESOPHAGEAL DILATION N/A 04/17/2015   Procedure: ESOPHAGEAL DILATION WITH 56FR MALONEY DILATOR; Surgeon: [name], MD; Location: [facility]; Service: Endoscopy; Laterality: N/A;  . ESOPHAGOGASTRODUODENOSCOPY (EGD) WITH PROPOFOL N/A 04/17/2015   Procedure: ESOPHAGOGASTRODUODENOSCOPY (EGD) WITH PROPOFOL; Surgeon: [name], MD; Location: [facility]; Service: Endoscopy; Laterality: N/A;  . EXPLORATORY LAPAROTOMY  2003  . FOOT SURGERY Bilateral 2000   Bone spur removed  . FOOT SURGERY    . INCISIONAL HERNIA REPAIR N/A 06/09/2014   Procedure: RECURRENT INCISIONAL HERNIORRHAPHY WITH MESH; Surgeon: [name], MD; Location: [facility]; Service: General; Laterality: N/A;  . INCISIONAL  HERNIA REPAIR N/A 10/15/2014   Procedure: [place]; Surgeon: [name], MD; Location: [facility]; Service: General; Laterality: N/A;  . INSERTION OF MESH N/A 06/09/2014   Procedure: INSERTION OF MESH; Surgeon: [name], MD; Location: [facility]; Service: General; Laterality: N/A;  . INSERTION OF MESH N/A 10/15/2014   Procedure: INSERTION OF MESH; Surgeon: [name], MD; Location: [facility]; Service: General; Laterality: N/A;  . POLYPECTOMY  04/17/2015   Procedure: POLYPECTOMY; Surgeon: [name], MD; Location: [facility]; Service: Endoscopy;;  . RESECTION DISTAL CLAVICAL  05/07/2012   Procedure: RESECTION DISTAL CLAVICAL; Surgeon: [name], MD; Location: [facility]; Service: Orthopedics; Laterality: Right;  . SHOULDER OPEN ROTATOR CUFF REPAIR  05/07/2012   Procedure: ROTATOR CUFF REPAIR SHOULDER OPEN; Surgeon: [name], MD; Location: [facility]; Service: Orthopedics; Laterality: Right;  . THYROIDECTOMY  2009  . TONSILLECTOMY  1958  . UMBILICAL HERNIA REPAIR  2010   Social History:   Social History        Tobacco Use  . Smoking status: Never Smoker  . Smokeless tobacco: Never Used  Substance Use Topics  . Alcohol use: No    Alcohol/week: 0.0 standard drinks   Family History :        Family History  Problem Relation Age of Onset  . Heart disease Other   . Arthritis Other   . Cancer Other   . Asthma Other   . Diabetes Other   . Kidney disease Other    Home Medications:          Prior to Admission medications   Medication Sig Start Date End Date Taking? Authorizing Provider  apixaban (ELIQUIS) 5 MG TABS tablet Take 1 tablet (5 mg total) by mouth 2 (two) times daily. 11/19/18  Yes Satira Sark, MD  colesevelam [facility]) 625 MG tablet Take 1,875 mg by mouth as directed. 3 tabs am 3 tabs pm   Yes [provider]  diltiazem (CARDIZEM CD) 360 MG 24 hr capsule Take 1 capsule (360 mg total) by mouth daily. 03/27/18  Yes  Satira Sark, MD  DULoxetine (CYMBALTA) 30 MG capsule Take 30 mg by mouth daily.  01/18/17  Yes [provider]  DULoxetine (CYMBALTA) 60 MG capsule Take 60 mg by mouth daily.  03/20/15  Yes [provider]  econazole nitrate 1 % cream Apply topically daily.  Patient taking differently: Apply 1 application topically daily as needed (for rash irritation).  12/09/15  Yes Tuchman, Richard C, DPM  furosemide (LASIX) 20 MG tablet Take 20 mg by mouth daily as needed for fluid.    Yes [provider]  gabapentin (NEURONTIN) 300 MG capsule Take 300 mg by mouth daily. 10/19/18  Yes [provider]  HYDROcodone-acetaminophen (NORCO/VICODIN) 5-325 MG tablet Take 1 tablet by mouth every 4 (four)  hours as needed.  Patient taking differently: Take 1 tablet by mouth every 4 (four) hours as needed for moderate pain or severe pain.  12/04/18  Yes Lily Kocher, PA-C  hydrOXYzine (ATARAX/VISTARIL) 25 MG tablet Take 25 mg by mouth 4 (four) times daily as needed for itching.  10/12/18  Yes [provider]  LANTUS SOLOSTAR 100 UNIT/ML Solostar Pen Inject 44 Units into the skin at bedtime.  09/21/18  Yes [provider]  levothyroxine (SYNTHROID) 200 MCG tablet Take 200 mcg by mouth daily before breakfast. Take in addition to 75 mcg for a total of 275 mcg daily   Yes [provider]  levothyroxine (SYNTHROID) 75 MCG tablet Take 50-75 mcg by mouth daily before breakfast. DOSE NEEDS TO BE VERIFIED!   Yes [provider]  losartan (COZAAR) 100 MG tablet Take 100 mg by mouth daily.  04/27/18  Yes [provider]  meloxicam (MOBIC) 15 MG tablet Take 15 mg by mouth daily.  10/22/15  Yes [provider]  metFORMIN (GLUCOPHAGE) 1000 MG tablet Take 1,000 mg by mouth 2 (two) times daily with a meal.   Yes [provider]  nystatin (NYSTOP) 100000 UNIT/GM POWD Apply 100,000 g topically daily as needed (for rash-irritation). Rash 08/18/10  Yes  [provider]  nystatin cream (MYCOSTATIN) Apply 1 application topically 3 (three) times daily.  03/23/17  Yes [provider]  potassium chloride SA (K-DUR,KLOR-CON) 20 MEQ tablet Take 20 mEq by mouth 2 (two) times daily.  04/27/18  Yes [provider]  pravastatin (PRAVACHOL) 20 MG tablet Take 20 mg by mouth daily. 10/30/18  Yes [provider]  tolterodine (DETROL LA) 4 MG 24 hr capsule Take 4 mg by mouth 2 (two) times a day. 08/17/18  Yes [provider]  traZODone (DESYREL) 100 MG tablet Take 100-200 mg by mouth at bedtime. 09/21/18  Yes [provider]  albuterol (PROVENTIL HFA) 108 (90 BASE) MCG/ACT inhaler Inhale 2 puffs into the lungs every 6 (six) hours as needed for wheezing or shortness of breath. Shortness of breath    [provider]   Allergies:        Allergies  Allergen Reactions  . Doxycycline Itching  . Adhesive [Tape] Other (See Comments)    Blisters skin  . Biaxin [Clarithromycin] Other (See Comments)    Really bad yeast infection  . Percocet [Oxycodone-Acetaminophen] Itching  . Xarelto [Rivaroxaban] Itching  . Clarithromycin Rash    Yeast Infection  . Clindamycin/Lincomycin Rash    Yeast Infection   . Neosporin [Neomycin-Polymyxin B Gu] Rash    Eyes Drops only  . Penicillins Rash    Has patient had a PCN reaction causing immediate rash, facial/tongue/throat swelling, SOB or lightheadedness with hypotension: Yes  Has patient had a PCN reaction causing severe rash involving mucus membranes or skin necrosis: No  Has patient had a PCN reaction that required hospitalization: No  Has patient had a PCN reaction occurring within the last 10 years: No  If all of the above answers are "NO", then may proceed with Cephalosporin use.    Physical Exam:  Vitals  Blood pressure (!) 141/66, pulse 97, temperature (!) 101.5 F (38.6 C), temperature source Oral, resp. rate 18, height 5' 2"  (1.575 m), weight 132 kg, SpO2  100 %.  BMI 53.23  1. General:  axox3  2. Psychiatric:  euthymic  3. Neurologic:  Cn2-12 intact, reflexes 2+ symmetric, diffuse with no clonus, motor 5/5 in all 4 ext  4. HEENMT:  Anicteric, pupils 1.66m symmetric, direct, consensual, near intact  Neck: no jvd  5. Respiratory :  CTAB  6. Cardiovascular :  rrr s1, s2, no m/g/r  7. Gastrointestinal:  Abd: soft, morbidly obese, nt, nd, +bs, Slight excoriations over the abdomen  8. Skin:  Ext: no c/c/e, Excoriations over the bilateral distal lower ext  9.Musculoskeletal:  Good ROM  No adenopathy  Data Review:  CBC  Last Labs                                                              ------------------------------------------------------------------------------------------------------------------  Lab Results Last 48 Hours  Chemistries  Last Labs                                                              ------------------------------------------------------------------------------------------------------------------  ------------------------------------------------------------------------------------------------------------------  GFR:  Estimated Creatinine Clearance: 93 mL/min (by C-G formula based on SCr of 0.68 mg/dL).  Liver Function Tests:  Last Labs                                        Last Labs      Last Labs     Coagulation Profile:  Last Labs     Cardiac Enzymes:  Last Labs     BNP (last 3 results)  Recent Labs (within last 365 days)    HbA1C:  Recent Labs (last 2 labs)     CBG:  Last Labs     Lipid Profile:  Recent Labs (last 2 labs)     Thyroid Function Tests:  Recent Labs (last 2 labs)     Anemia Panel:  Recent Labs (last 2 labs)     ---------------------------------------------------------------------------------------------------------------  Urine analysis:  Labs (Brief)                                                                                         Imaging Results:   Imaging Results (Last 48 hours)    nsr at 95, nl axis, 1st degree avb, nl qt, no st-t changes c/w ischemia  Assessment & Plan:  Principal Problem:  Fever  Active Problems:  Type 2 diabetes mellitus (HCC)  Essential hypertension, benign  Atrial fibrillation (H[facility])  COPD (chronic obstructive pulmonary disease) (H[facility])  GERD (gastroesophageal reflux disease)  Hypothyroidism  Sleep apnea  Hyponatremia  Abnormal liver function  Fever of unclear origin  ? UTI (pt was given levaquin prior to urinalysis collection)  ? Early sepsis (Fever, tachycardia hr 105)  Blood culture x2  Check ESR, Crp  Check cardiac echo  Check CT chest, abd/ pelvis  Check cbc in am  Vanco iv, levaquin iv pharmacy to dose  Abnormal liver function  Check CT abd/ pelvis  Check GGT  Check cmp in am  Hyponatremia  Check urine sodium, urine osm  Check serum osm, cortisol, tsh  Hydrate w ns iv  Check cmp in am  Dm2  Cont Metformin 1057m po bid  Cont Lantus 44 units Egan qday  fsbs ac and qhs, ISS  Diabetic neuropathy  Cont Gabapentin  Hypertension  Cont Losartan 1079mpo qday  H/o lower ext edema  Cont Lasix 2052mo qday  Pafib  Cont Cardizem CD 360m71m qday  Cont Eliquis pharmacy to dose  Hyperlipidemia  Cont Pravastatin 20mg83mqhs  Cont  Welchol  Hypothyroidism  Cont Levothyroxine  Anxiety  Cont duloxetine  Cont Trazodone  Asthma/ Copd/ OSA  DVT Prophylaxis- Eliquis  AM Labs Ordered, also please review  Full Orders  Family Communication: Admission, patients condition and plan of care including tests being ordered have been discussed with the patient who indicate understanding and agree with the plan and Code Status.  Code Status: FULL CODE  Admission status: Observation : Based on patients clinical presentation and evaluation of above clinical data, I have made determination that patient meets observation criteria at this time.  Time spent in minutes : 10mnutes     JJani GravelM.D on 12/24/2018 at 12:50 AM

## 2018-12-25 NOTE — Progress Notes (Signed)
    [facility] has been requested to perform a transesophageal echocardiogram on [name] [name] for bacteremia.  After careful review of history and examination, the risks and benefits of transesophageal echocardiogram have been explained including risks of esophageal damage, perforation (1:10,000 risk), bleeding, pharyngeal hematoma as well as other potential complications associated with conscious sedation including aspiration, arrhythmia, respiratory failure and death. Alternatives to treatment were discussed, questions were answered. Patient is willing to proceed.   Hgb stable at 10.4. Platelets 105 K. BP has been stable with no requirement for pressor support. Scheduled for 12/26/2018 at 0730 with Dr. Harl Bowie. Will need to be NPO after midnight. Scheduled with anesthesia assistance given body habitus and patient's report of having severe anxiety with twilight sedation in the past with different procedures.   Erma Heritage, PA-C  12/25/2018 8:42 AM

## 2018-12-25 NOTE — Telephone Encounter (Signed)
12/25/18  pt left a message to cx said that she was in the [place]

## 2018-12-26 ENCOUNTER — Inpatient Hospital Stay: Payer: Self-pay

## 2018-12-26 ENCOUNTER — Inpatient Hospital Stay (HOSPITAL_COMMUNITY): Payer: PPO | Admitting: Anesthesiology

## 2018-12-26 ENCOUNTER — Encounter (HOSPITAL_COMMUNITY)
Admission: EM | Disposition: A | Discharge status: Home Health | Payer: Self-pay | Source: Home / Self Care | Attending: Internal Medicine

## 2018-12-26 ENCOUNTER — Inpatient Hospital Stay (HOSPITAL_COMMUNITY): Payer: PPO

## 2018-12-26 DIAGNOSIS — I371 Nonrheumatic pulmonary valve insufficiency: Secondary | ICD-10-CM

## 2018-12-26 DIAGNOSIS — I361 Nonrheumatic tricuspid (valve) insufficiency: Secondary | ICD-10-CM

## 2018-12-26 DIAGNOSIS — I340 Nonrheumatic mitral (valve) insufficiency: Secondary | ICD-10-CM

## 2018-12-26 HISTORY — PX: TEE WITHOUT CARDIOVERSION: SHX5443

## 2018-12-26 LAB — BASIC METABOLIC PANEL
Anion gap: 6 (ref 5–15)
BUN: 12 mg/dL (ref 8–23)
CO2: 24 mmol/L (ref 22–32)
Calcium: 8.7 mg/dL — ABNORMAL LOW (ref 8.9–10.3)
Chloride: 103 mmol/L (ref 98–111)
Creatinine, Ser: 0.66 mg/dL (ref 0.44–1.00)
GFR calc Af Amer: >60 mL/min (ref >60)
GFR calc non Af Amer: >60 mL/min (ref >60)
Glucose, Bld: 137 mg/dL — ABNORMAL HIGH (ref 70–99)
Potassium: 4.4 mmol/L (ref 3.5–5.1)
Sodium: 133 mmol/L — ABNORMAL LOW (ref 135–145)

## 2018-12-26 LAB — CULTURE, BLOOD (ROUTINE X 2)
Special Requests: ADEQUATE
Special Requests: ADEQUATE

## 2018-12-26 LAB — GLUCOSE, CAPILLARY
Glucose-Capillary: 116 mg/dL — ABNORMAL HIGH (ref 70–99)
Glucose-Capillary: 117 mg/dL — ABNORMAL HIGH (ref 70–99)
Glucose-Capillary: 156 mg/dL — ABNORMAL HIGH (ref 70–99)

## 2018-12-26 LAB — CBC
HCT: 32.6 % — ABNORMAL LOW (ref 36.0–46.0)
Hemoglobin: 10 g/dL — ABNORMAL LOW (ref 12.0–15.0)
MCH: 25.6 pg — ABNORMAL LOW (ref 26.0–34.0)
MCHC: 30.7 g/dL (ref 30.0–36.0)
MCV: 83.6 fL (ref 80.0–100.0)
Platelets: 109 10*3/uL — ABNORMAL LOW (ref 150–400)
RBC: 3.9 MIL/uL (ref 3.87–5.11)
RDW: 20.2 % — ABNORMAL HIGH (ref 11.5–15.5)
WBC: 4.5 10*3/uL (ref 4.0–10.5)
nRBC: 0 % (ref 0.0–0.2)

## 2018-12-26 LAB — MAGNESIUM: Magnesium: 1.7 mg/dL (ref 1.7–2.4)

## 2018-12-26 SURGERY — ECHOCARDIOGRAM, TRANSESOPHAGEAL
Anesthesia: Monitor Anesthesia Care

## 2018-12-26 MED ORDER — CEFAZOLIN IV (FOR PTA / DISCHARGE USE ONLY): 2.0000 g | Freq: Three times a day (TID) | INTRAVENOUS | 0 refills | Status: AC

## 2018-12-26 MED ORDER — PROMETHAZINE HCL 25 MG/ML IJ SOLN: 6.2500 mg | INTRAMUSCULAR | Status: DC | PRN

## 2018-12-26 MED ORDER — MIDAZOLAM HCL 2 MG/2ML IJ SOLN: 0.5000 mg | Freq: Once | INTRAMUSCULAR | Status: DC | PRN

## 2018-12-26 MED ORDER — MIDAZOLAM HCL 5 MG/5ML IJ SOLN
INTRAMUSCULAR | Status: DC | PRN
  Administered 2018-12-26: 2 mg via INTRAVENOUS

## 2018-12-26 MED ORDER — LACTATED RINGERS IV SOLN
INTRAVENOUS | Status: DC
  Administered 2018-12-26: 08:00:00 via INTRAVENOUS

## 2018-12-26 MED ORDER — METHOCARBAMOL 500 MG PO TABS: 500.0000 mg | ORAL_TABLET | Freq: Four times a day (QID) | ORAL | 0 refills | Status: AC | PRN

## 2018-12-26 MED ORDER — GLYCOPYRROLATE PF 0.2 MG/ML IJ SOSY
PREFILLED_SYRINGE | INTRAMUSCULAR | Status: AC
  Filled 2018-12-26: qty 1

## 2018-12-26 MED ORDER — PROPOFOL 500 MG/50ML IV EMUL
INTRAVENOUS | Status: DC | PRN
  Administered 2018-12-26: 75 ug/kg/min via INTRAVENOUS

## 2018-12-26 MED ORDER — KETAMINE HCL 50 MG/5ML IJ SOSY
PREFILLED_SYRINGE | INTRAMUSCULAR | Status: AC
  Filled 2018-12-26: qty 5

## 2018-12-26 MED ORDER — PROPOFOL 10 MG/ML IV BOLUS
INTRAVENOUS | Status: AC
  Filled 2018-12-26: qty 60

## 2018-12-26 MED ORDER — CEFAZOLIN SODIUM-DEXTROSE 2-3 GM-%(50ML) IV SOLR
INTRAVENOUS | Status: DC | PRN
  Administered 2018-12-26: 2 g via INTRAVENOUS

## 2018-12-26 MED ORDER — BUTAMBEN-TETRACAINE-BENZOCAINE 2-2-14 % EX AERO
INHALATION_SPRAY | CUTANEOUS | Status: AC
  Filled 2018-12-26: qty 5

## 2018-12-26 MED ORDER — METHOCARBAMOL 500 MG PO TABS
500.0000 mg | ORAL_TABLET | Freq: Four times a day (QID) | ORAL | Status: DC | PRN
  Administered 2018-12-26: 09:00:00 500 mg via ORAL
  Filled 2018-12-26: qty 1

## 2018-12-26 MED ORDER — KETAMINE HCL 10 MG/ML IJ SOLN
INTRAMUSCULAR | Status: DC | PRN
  Administered 2018-12-26: 20 mg via INTRAVENOUS

## 2018-12-26 MED ORDER — MIDAZOLAM HCL 2 MG/2ML IJ SOLN
INTRAMUSCULAR | Status: AC
  Filled 2018-12-26: qty 2

## 2018-12-26 MED ORDER — LIDOCAINE 2% (20 MG/ML) 5 ML SYRINGE
INTRAMUSCULAR | Status: AC
  Filled 2018-12-26: qty 5

## 2018-12-26 MED ORDER — SODIUM CHLORIDE 0.9% FLUSH: 10.0000 mL | INTRAVENOUS | Status: DC | PRN

## 2018-12-26 NOTE — CV Procedure (Cosign Needed)
CV procedure note  Procedure: TEE Indication: Bacteremia Physician: Dr Carlyle Dolly MD   Patient was brought to the procedure suite after appropriate consent was obtained. Sedation was achieved with the assistance of anesthesiology, for details please refer to there note. Bite block was placed and patient placed in the left lateral decubitus position. The TEE probe was intubated into the esophagus without difficultly and several images obtained. Please see full TEE report for findings, in brief no evidence of valvular vegetations. Cardipulmonary monitoring was performed throughout the procedure, she tolerated well without difficultly   Zandra Abts MD

## 2018-12-26 NOTE — Anesthesia Postprocedure Evaluation (Signed)
Anesthesia Post Note  Patient: [name] [name]  Procedure(s) Performed: TRANSESOPHAGEAL ECHOCARDIOGRAM (TEE) WITH PROPOFOL (N/A )  Patient location during evaluation: PACU Anesthesia Type: MAC Level of consciousness: awake, oriented and patient cooperative Pain management: pain level controlled Vital Signs Assessment: post-procedure vital signs reviewed and stable Respiratory status: spontaneous breathing, nonlabored ventilation, respiratory function stable and patient connected to nasal cannula oxygen Cardiovascular status: stable Postop Assessment: no apparent nausea or vomiting Anesthetic complications: no     Last Vitals:  Vitals:   12/26/18 0811 12/26/18 0815  BP: (!) 136/59 130/67  Pulse: 84 83  Resp: 13 14  Temp: 36.9 C   SpO2: 95% 100%    Last Pain:  Vitals:   12/26/18 0811  TempSrc:   PainSc: 0-No pain                 Bailley Guilford

## 2018-12-26 NOTE — Plan of Care (Signed)

## 2018-12-26 NOTE — Progress Notes (Signed)
*  PRELIMINARY RESULTS* Echocardiogram Echocardiogram Transesophageal has been performed.  [name] [name] 12/26/2018, 9:21 AM

## 2018-12-26 NOTE — Discharge Summary (Signed)
Physician Discharge Summary  AMAANI GUILBAULT NWG:[id number] DOB: Nov 26, 1955 DOA: 12/23/2018  PCP: Redmond School, MD  Admit date: 12/23/2018  Discharge date: 12/26/2018  Admitted From:Home  Disposition:  Home  Recommendations for Outpatient Follow-up:  1. Follow up with PCP in 1-2 weeks 2. Continue on home infusions of IV Ancef through PICC line with home health services through 7/20 to finish course of treatment for MSSA bacteremia  Home Health: Yes with RN  Equipment/Devices: None  Discharge Condition: Stable  CODE STATUS: Full  Diet recommendation: Heart Healthy/carb modified  Brief/Interim Summary: Per HPI: [name] a57 y.o.female,w iron def anemia, NASH, h/o thyroid cancer, hypothyroidism, hypertension, Dm2, chronic Afib, morbid obeisity, OSA, asthma/ Copd, recent admission for sepsis due to left lower extremity cellulitis 11/07/2018 apparently presents with c/o fever. Pt called EMS for headache, fever, T 103.7. Pt having dry heaves en route to ER per RN notes. Pt feels like she might be getting septic as before.   Pt denies rash, joint pain, cough, cp, palp, abd pain, diarrhea, dysuria, hematuria.  Patient was admitted with fever of unclear origin, but now is noted to have MSSA bacteremia and has been switched from vancomycin to Ancef overnight.  Transthoracic echocardiogram on 7/6 did not demonstrate any vegetations.  Cardiology was consulted for TEE and she underwent the study on 7/8 with no valvular vegetations or signs of endocarditis noted.  Repeat blood cultures with no growth noted and PICC line will be placed.  She is to remain on Ancef at home for 12 more days to complete the course of treatment through 7/20.  She will be given home health nursing services.  No other acute events noted throughout the course of this admission.  She was noted to have some spasms to her low back for which she has been prescribed some Robaxin as needed at home.  Discharge  Diagnoses:  Principal Problem:   Fever Active Problems:   Type 2 diabetes mellitus (HCC)   Essential hypertension, benign   Atrial fibrillation (HCC)   COPD (chronic obstructive pulmonary disease) (HCC)   GERD (gastroesophageal reflux disease)   Hypothyroidism   Sleep apnea   Hyponatremia   Abnormal liver function  Principal discharge diagnosis: MSSA bacteremia  Discharge Instructions  Discharge Instructions    Diet - low sodium heart healthy   Complete by: As directed    Home infusion instructions [facility] May follow [place] Dosing Protocol; May administer Cathflo as needed to maintain patency of vascular access device.; Flushing of vascular access device: per [facility] Protocol: 0.9% NaCl pre/post medica...   Complete by: As directed    Instructions: May follow [place] Dosing Protocol   Instructions: May administer Cathflo as needed to maintain patency of vascular access device.   Instructions: Flushing of vascular access device: per [facility] Protocol: 0.9% NaCl pre/post medication administration and prn patency; Heparin 100 u/ml, 19m for implanted ports and Heparin 10u/ml, 574mfor all other central venous catheters.   Instructions: May follow [facility] Anaphylaxis Protocol for First Dose Administration in the home: 0.9% NaCl at 25-50 ml/hr to maintain IV access for protocol meds. Epinephrine 0.3 ml IV/IM PRN and Benadryl 25-50 IV/IM PRN s/s of anaphylaxis.   Instructions: [facility] [place] Coordinator (RN) to assist per patient IV care needs in the home PRN.   Increase activity slowly   Complete by: As directed      Allergies as of 12/26/2018      Reactions   Doxycycline Itching  Adhesive [tape] Other (See Comments)   Blisters skin   Biaxin [clarithromycin] Other (See Comments)   Really bad yeast infection   Percocet [oxycodone-acetaminophen] Itching   Xarelto [rivaroxaban] Itching   Clarithromycin Rash   Yeast Infection   Clindamycin/lincomycin Rash   Yeast  Infection    Neosporin [neomycin-polymyxin B Gu] Rash   Eyes Drops only   Penicillins Rash   Has patient had a PCN reaction causing immediate rash, facial/tongue/throat swelling, SOB or lightheadedness with hypotension: Yes Has patient had a PCN reaction causing severe rash involving mucus membranes or skin necrosis: No Has patient had a PCN reaction that required hospitalization: No Has patient had a PCN reaction occurring within the last 10 years: No If all of the above answers are "NO", then may proceed with Cephalosporin use.      Medication List    TAKE these medications   apixaban 5 MG Tabs tablet Commonly known as: Eliquis Take 1 tablet (5 mg total) by mouth 2 (two) times daily.   ceFAZolin  IVPB Commonly known as: ANCEF Inject 2 g into the vein every 8 (eight) hours for 12 days. Indication:  MSSA Bacteremia Last Day of Therapy:  01/07/19 Labs - Once weekly:  CBC/D and BMP, Labs - Every other week:  ESR and CRP   colesevelam 625 MG tablet Commonly known as: WELCHOL Take 1,875 mg by mouth as directed. 3 tabs am 3 tabs pm   diltiazem 360 MG 24 hr capsule Commonly known as: Cartia XT Take 1 capsule (360 mg total) by mouth daily.   DULoxetine 60 MG capsule Commonly known as: CYMBALTA Take 60 mg by mouth daily.   DULoxetine 30 MG capsule Commonly known as: CYMBALTA Take 30 mg by mouth daily.   econazole nitrate 1 % cream Apply topically daily. What changed:   how much to take  when to take this  reasons to take this   furosemide 20 MG tablet Commonly known as: LASIX Take 20 mg by mouth daily as needed for fluid.   gabapentin 300 MG capsule Commonly known as: NEURONTIN Take 300 mg by mouth daily.   HYDROcodone-acetaminophen 5-325 MG tablet Commonly known as: NORCO/VICODIN Take 1 tablet by mouth every 4 (four) hours as needed. What changed: reasons to take this   hydrOXYzine 25 MG tablet Commonly known as: ATARAX/VISTARIL Take 25 mg by mouth 4 (four)  times daily as needed for itching.   Lantus SoloStar 100 UNIT/ML Solostar Pen Generic drug: Insulin Glargine Inject 44 Units into the skin at bedtime.   levothyroxine 75 MCG tablet Commonly known as: SYNTHROID Take 50-75 mcg by mouth daily before breakfast. DOSE NEEDS TO BE VERIFIED!   levothyroxine 200 MCG tablet Commonly known as: SYNTHROID Take 200 mcg by mouth daily before breakfast. Take in addition to 75 mcg for a total of 275 mcg daily   losartan 100 MG tablet Commonly known as: COZAAR Take 100 mg by mouth daily.   meloxicam 15 MG tablet Commonly known as: MOBIC Take 15 mg by mouth daily.   metFORMIN 1000 MG tablet Commonly known as: GLUCOPHAGE Take 1,000 mg by mouth 2 (two) times daily with a meal.   methocarbamol 500 MG tablet Commonly known as: ROBAXIN Take 1 tablet (500 mg total) by mouth every 6 (six) hours as needed for muscle spasms.   nystatin powder Generic drug: nystatin Apply 100,000 g topically daily as needed (for rash-irritation). Rash   nystatin cream Commonly known as: MYCOSTATIN Apply 1 application topically 3 (  three) times daily.   potassium chloride SA 20 MEQ tablet Commonly known as: K-DUR Take 20 mEq by mouth 2 (two) times daily.   pravastatin 20 MG tablet Commonly known as: PRAVACHOL Take 20 mg by mouth daily.   Proventil HFA 108 (90 Base) MCG/ACT inhaler Generic drug: albuterol Inhale 2 puffs into the lungs every 6 (six) hours as needed for wheezing or shortness of breath. Shortness of breath   tolterodine 4 MG 24 hr capsule Commonly known as: DETROL LA Take 4 mg by mouth 2 (two) times a day.   traZODone 100 MG tablet Commonly known as: DESYREL Take 100-200 mg by mouth at bedtime.            Home Infusion Instuctions  (From admission, onward)         Start     Ordered   12/26/18 0000  Home infusion instructions [facility] May follow [place] Dosing Protocol; May administer Cathflo as needed to maintain  patency of vascular access device.; Flushing of vascular access device: per [facility] Protocol: 0.9% NaCl pre/post medica...    Question Answer Comment  Instructions May follow [place] Dosing Protocol   Instructions May administer Cathflo as needed to maintain patency of vascular access device.   Instructions Flushing of vascular access device: per [facility] Protocol: 0.9% NaCl pre/post medication administration and prn patency; Heparin 100 u/ml, 25m for implanted ports and Heparin 10u/ml, 528mfor all other central venous catheters.   Instructions May follow [facility] Anaphylaxis Protocol for First Dose Administration in the home: 0.9% NaCl at 25-50 ml/hr to maintain IV access for protocol meds. Epinephrine 0.3 ml IV/IM PRN and Benadryl 25-50 IV/IM PRN s/s of anaphylaxis.   Instructions [facility] Infusion Coordinator (RN) to assist per patient IV care needs in the home PRN.      12/26/18 1442         Follow-up Information    FuRedmond SchoolMD Follow up in 2 week(s).   Specialty: Internal Medicine Contact information: [phone]        McSatira SarkMD .   Specialty: Cardiology Contact information: [id number]        Allergies  Allergen Reactions  . Doxycycline Itching  . Adhesive [Tape] Other (See Comments)    Blisters skin  . Biaxin [Clarithromycin] Other (See Comments)    Really bad yeast infection  . Percocet [Oxycodone-Acetaminophen] Itching  . Xarelto [Rivaroxaban] Itching  . Clarithromycin Rash    Yeast Infection  . Clindamycin/Lincomycin Rash    Yeast Infection   . Neosporin [Neomycin-Polymyxin B Gu] Rash    Eyes Drops only  . Penicillins Rash    Has patient had a PCN reaction causing immediate rash, facial/tongue/throat swelling, SOB or lightheadedness with hypotension: Yes Has patient had a PCN reaction causing severe rash involving mucus membranes or skin necrosis:  No Has patient had a PCN reaction that required hospitalization: No Has patient had a PCN reaction occurring within the last 10 years: No If all of the above answers are "NO", then may proceed with Cephalosporin use.     Consultations:  Cardiology   Procedures/Studies: Ct Head Wo Contrast  Result Date: 12/04/2018 CLINICAL DATA:  [place] hit head. EXAM: CT HEAD WITHOUT CONTRAST CT CERVICAL SPINE WITHOUT CONTRAST TECHNIQUE: Multidetector CT imaging of the head and cervical spine was performed following the standard protocol without intravenous contrast. Multiplanar CT image  reconstructions of the cervical spine were also generated. COMPARISON:  11/06/2018 FINDINGS: CT HEAD FINDINGS Brain: The ventricles are in the midline without mass effect or shift. No extra-axial fluid collections are identified. The gray-white differentiation is maintained. No CT findings for acute hemispheric infarction or intracranial hemorrhage. The brainstem and cerebellum appear normal. Vascular: Scattered vascular calcifications but no aneurysm or hyperdense vessels. Skull: No acute skull fracture or worrisome bone lesion. Sinuses/Orbits: The paranasal sinuses and mastoid air cells are clear. The globes are intact. Other: There is a scalp hematoma noted at the left vertex with a laceration but no radiopaque foreign body. CT CERVICAL SPINE FINDINGS Alignment: Normal. Skull base and vertebrae: No acute fracture. No primary bone lesion or focal pathologic process. Soft tissues and spinal canal: No prevertebral fluid or swelling. No visible canal hematoma. Disc levels: The spinal canal is fairly generous. No spinal or foraminal stenosis. Upper chest:  No apical pulmonary lesions. No mediastinal hematoma Other: Surgical changes from a thyroidectomy. No neck mass or adenopathy. IMPRESSION: 1. No acute intracranial findings or skull fracture. 2. Scalp hematoma noted at the left vertex. 3. Normal alignment of the cervical vertebral  bodies and no acute cervical spine fracture. Electronically Signed   By: Marijo Sanes M.D.   On: 12/04/2018 15:26   Ct Chest W Contrast  Result Date: 12/24/2018 CLINICAL DATA:  Fever of unknown origin. Elevation in alkaline phosphatase. EXAM: CT CHEST, ABDOMEN, AND PELVIS WITH CONTRAST TECHNIQUE: Multidetector CT imaging of the chest, abdomen and pelvis was performed following the standard protocol during bolus administration of intravenous contrast. CONTRAST:  163m OMNIPAQUE IOHEXOL 300 MG/ML  SOLN COMPARISON:  Radiograph yesterday. Chest CT 11/11/2011. Abdominal CT 06/14/2017 FINDINGS: CT CHEST FINDINGS Cardiovascular: Aortic atherosclerosis without aneurysm or dissection. No central pulmonary embolus to the lobar level. Mild cardiomegaly with coronary artery calcifications. No pericardial effusion. Mediastinum/Nodes: No enlarged mediastinal or hilar lymph nodes. Decompressed esophagus. Post thyroidectomy. Lungs/Pleura: Minor hypoventilatory change at the bases. No consolidation to suggest pneumonia. No pulmonary edema or pleural fluid. Trachea and mainstem bronchi are patent. Musculoskeletal: There are no acute or suspicious osseous abnormalities. Small bone island within T5. CT ABDOMEN PELVIS FINDINGS Hepatobiliary: Prominent liver spanning 18.5 cm. No focal abnormality. No capsular nodularity as before. Clips in the gallbladder fossa postcholecystectomy. No biliary dilatation. Pancreas: Parenchymal atrophy. No ductal dilatation or inflammation. Spleen: Splenomegaly with spleen spanning 16 cm cranial caudal and 17.4 cm AP, slight increase from prior. No focal abnormality. Adrenals/Urinary Tract: No adrenal nodule. No hydronephrosis or perinephric edema. Homogeneous renal enhancement with symmetric excretion on delayed phase imaging. Urinary bladder is physiologically distended without wall thickening. Stomach/Bowel: Colonic redundancy with large colonic stool burden. No colonic or bowel wall thickening. No  bowel inflammation. Ventral abdominal hernia contains noninflamed nonobstructed small bowel. No obstruction. Stomach is nondistended. Appendix not visualized. Vascular/Lymphatic: Mild aortic atherosclerosis. No aneurysm. Patent portal vein. 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 Small retroperitoneal nodes are all subcentimeter. Enlarged right external iliac node measures 16 mm, this is unchanged from prior exam. Enlarged bilateral inguinal nodes with slight increase. Prominent bilateral external iliac nodes. Reproductive: Status post hysterectomy. No adnexal masses. Other: Bilateral subcutaneous flank edema. Lower anterior abdominal pannus edema with skin thickening inferiorly. No focal subcutaneous collection. No soft tissue air. No free air free fluid in the abdomen or pelvis. Musculoskeletal: There are no acute or suspicious osseous abnormalities. IMPRESSION: 1. Lower anterior abdominal pannus edema with skin thickening inferiorly. This may be due  to edema or cellulitis. No abscess or soft tissue air. 2. No other acute findings or explanation for fever in the chest, abdomen, or pelvis. 3. Mild hepatosplenomegaly. Nodular hepatic contours suggest cirrhosis. 4. Mild bilateral inguinal and external iliac adenopathy, likely reactive. 5. Lower abdominal wall hernia containing nonobstructed noninflamed small bowel. Large colonic stool burden with colonic redundancy, suggesting constipation. Aortic Atherosclerosis (ICD10-I70.0). Electronically Signed   By: Keith Rake M.D.   On: 12/24/2018 02:36   Ct Cervical Spine Wo Contrast  Result Date: 12/04/2018 CLINICAL DATA:  [place] and hit head. EXAM: CT HEAD WITHOUT CONTRAST CT CERVICAL SPINE WITHOUT CONTRAST TECHNIQUE: Multidetector CT imaging of the head and cervical spine was performed following the standard protocol without intravenous contrast. Multiplanar CT image reconstructions of the cervical spine were also generated.  COMPARISON:  11/06/2018 FINDINGS: CT HEAD FINDINGS Brain: The ventricles are in the midline without mass effect or shift. No extra-axial fluid collections are identified. The gray-white differentiation is maintained. No CT findings for acute hemispheric infarction or intracranial hemorrhage. The brainstem and cerebellum appear normal. Vascular: Scattered vascular calcifications but no aneurysm or hyperdense vessels. Skull: No acute skull fracture or worrisome bone lesion. Sinuses/Orbits: The paranasal sinuses and mastoid air cells are clear. The globes are intact. Other: There is a scalp hematoma noted at the left vertex with a laceration but no radiopaque foreign body. CT CERVICAL SPINE FINDINGS Alignment: Normal. Skull base and vertebrae: No acute fracture. No primary bone lesion or focal pathologic process. Soft tissues and spinal canal: No prevertebral fluid or swelling. No visible canal hematoma. Disc levels: The spinal canal is fairly generous. No spinal or foraminal stenosis. Upper chest:  No apical pulmonary lesions. No mediastinal hematoma Other: Surgical changes from a thyroidectomy. No neck mass or adenopathy. IMPRESSION: 1. No acute intracranial findings or skull fracture. 2. Scalp hematoma noted at the left vertex. 3. Normal alignment of the cervical vertebral bodies and no acute cervical spine fracture. Electronically Signed   By: Marijo Sanes M.D.   On: 12/04/2018 15:26   Ct Abdomen Pelvis W Contrast  Result Date: 12/24/2018 CLINICAL DATA:  Fever of unknown origin. Elevation in alkaline phosphatase. EXAM: CT CHEST, ABDOMEN, AND PELVIS WITH CONTRAST TECHNIQUE: Multidetector CT imaging of the chest, abdomen and pelvis was performed following the standard protocol during bolus administration of intravenous contrast. CONTRAST:  165m OMNIPAQUE IOHEXOL 300 MG/ML  SOLN COMPARISON:  Radiograph yesterday. Chest CT 11/11/2011. Abdominal CT 06/14/2017 FINDINGS: CT CHEST FINDINGS Cardiovascular: Aortic  atherosclerosis without aneurysm or dissection. No central pulmonary embolus to the lobar level. Mild cardiomegaly with coronary artery calcifications. No pericardial effusion. Mediastinum/Nodes: No enlarged mediastinal or hilar lymph nodes. Decompressed esophagus. Post thyroidectomy. Lungs/Pleura: Minor hypoventilatory change at the bases. No consolidation to suggest pneumonia. No pulmonary edema or pleural fluid. Trachea and mainstem bronchi are patent. Musculoskeletal: There are no acute or suspicious osseous abnormalities. Small bone island within T5. CT ABDOMEN PELVIS FINDINGS Hepatobiliary: Prominent liver spanning 18.5 cm. No focal abnormality. No capsular nodularity as before. Clips in the gallbladder fossa postcholecystectomy. No biliary dilatation. Pancreas: Parenchymal atrophy. No ductal dilatation or inflammation. Spleen: Splenomegaly with spleen spanning 16 cm cranial caudal and 17.4 cm AP, slight increase from prior. No focal abnormality. Adrenals/Urinary Tract: No adrenal nodule. No hydronephrosis or perinephric edema. Homogeneous renal enhancement with symmetric excretion on delayed phase imaging. Urinary bladder is physiologically distended without wall thickening. Stomach/Bowel: Colonic redundancy with large colonic stool burden. No colonic or bowel wall thickening. No bowel inflammation.  Ventral abdominal hernia contains noninflamed nonobstructed small bowel. No obstruction. Stomach is nondistended. Appendix not visualized. Vascular/Lymphatic: Mild aortic atherosclerosis. No aneurysm. Patent portal vein. 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 Small retroperitoneal nodes are all subcentimeter. Enlarged right external iliac node measures 16 mm, this is unchanged from prior exam. Enlarged bilateral inguinal nodes with slight increase. Prominent bilateral external iliac nodes. Reproductive: Status post hysterectomy. No adnexal masses. Other: Bilateral subcutaneous  flank edema. Lower anterior abdominal pannus edema with skin thickening inferiorly. No focal subcutaneous collection. No soft tissue air. No free air free fluid in the abdomen or pelvis. Musculoskeletal: There are no acute or suspicious osseous abnormalities. IMPRESSION: 1. Lower anterior abdominal pannus edema with skin thickening inferiorly. This may be due to edema or cellulitis. No abscess or soft tissue air. 2. No other acute findings or explanation for fever in the chest, abdomen, or pelvis. 3. Mild hepatosplenomegaly. Nodular hepatic contours suggest cirrhosis. 4. Mild bilateral inguinal and external iliac adenopathy, likely reactive. 5. Lower abdominal wall hernia containing nonobstructed noninflamed small bowel. Large colonic stool burden with colonic redundancy, suggesting constipation. Aortic Atherosclerosis (ICD10-I70.0). Electronically Signed   By: Keith Rake M.D.   On: 12/24/2018 02:36   Dg Chest Port 1 View  Result Date: 12/23/2018 CLINICAL DATA:  Fever. Sepsis. EXAM: PORTABLE CHEST 1 VIEW COMPARISON:  12/04/2018 FINDINGS: Body habitus and very low lung volumes limit assessment, patient's abdominal pannus partially obscures lung base evaluation. Unchanged heart size and mediastinal contours. Bronchovascular crowding. No dominant focal airspace disease. No visualized pneumothorax. Surgical clips at the thoracic inlet. IMPRESSION: Limited low lung volume exam. No gross acute abnormality. Consider repeat when patient is able. Electronically Signed   By: Keith Rake M.D.   On: 12/23/2018 20:46   Dg Chest Portable 1 View  Result Date: 12/04/2018 CLINICAL DATA:  Status post fall EXAM: PORTABLE CHEST 1 VIEW COMPARISON:  10/07/2018 FINDINGS: There is mild bilateral interstitial prominence. There is no focal consolidation. There is no pleural effusion or pneumothorax. The cardiomediastinal silhouette is stable. There is no acute osseous abnormality. IMPRESSION: No active disease.  Electronically Signed   By: Kathreen Devoid   On: 12/04/2018 14:09   Dg Humerus Left  Result Date: 12/04/2018 CLINICAL DATA:  [place].  Left arm pain. EXAM: LEFT HUMERUS - 2+ VIEW COMPARISON:  None. FINDINGS: The shoulder and elbow joints are grossly maintained. No acute fracture of the humerus is identified. IMPRESSION: No acute bony findings. Electronically Signed   By: Marijo Sanes M.D.   On: 12/04/2018 14:15      Subjective:   Discharge Exam: Vitals:   12/26/18 0900 12/26/18 1301  BP: (!) 139/59 (!) 152/68  Pulse: 81 86  Resp: 16 16  Temp:  98.9 F (37.2 C)  SpO2: 98% 97%   Vitals:   12/26/18 0815 12/26/18 0829 12/26/18 0900 12/26/18 1301  BP: 130/67 118/71 (!) 139/59 (!) 152/68  Pulse: 83 81 81 86  Resp: _0 Temp:    98.9 F (37.2 C)  TempSrc:    Oral  SpO2: 100% 99% 98% 97%  Weight:      Height:        General: Pt is alert, awake, not in acute distress Cardiovascular: RRR, S1/S2 +, no rubs, no gallops Respiratory: CTA bilaterally, no wheezing, no rhonchi Abdominal: Soft, NT, ND, bowel sounds + Extremities: no edema, no cyanosis    The results of significant diagnostics from this hospitalization (including imaging, microbiology,  ancillary and laboratory) are listed below for reference.     Microbiology: Recent Results (from the past 240 hour(s))  Blood Culture (routine x 2)     Status: Abnormal   Collection Time: 12/23/18  8:11 PM   Specimen: BLOOD  Result Value Ref Range Status   Specimen Description   Final    BLOOD RIGHT ANTECUBITAL Performed at [facility], [place] [address] [place], [place] [address]    Special Requests   Final    BOTTLES DRAWN AEROBIC AND ANAEROBIC Blood Culture adequate volume Performed at [place] [place], [place] [address]    Culture  Setup Time   Final    GRAM POSITIVE COCCI AEROBIC BOTTLE ONLY Gram Stain Report Called to,Read Back By and Verified With: MCMILLIAN B. AT 1020A ON [id number] BY  THOMPSON S. ANAEROBIC BOTTLE [facility] PREVIOUSLY CALLED MATTHEWS, B 7.6.2020 CRITICAL VALUE NOTED.  VALUE IS CONSISTENT WITH PREVIOUSLY REPORTED AND CALLED VALUE.    Culture (A)  Final    STAPHYLOCOCCUS AUREUS SUSCEPTIBILITIES PERFORMED ON PREVIOUS CULTURE WITHIN THE LAST 5 DAYS. Performed at [facility], [place] [address] [place], [place] [address]    Report Status 12/26/2018 FINAL  Final  Blood Culture (routine x 2)     Status: Abnormal   Collection Time: 12/23/18  8:20 PM   Specimen: BLOOD RIGHT ARM  Result Value Ref Range Status   Specimen Description   Final    BLOOD RIGHT ARM Performed at [facility], [place] [address], [place], [place] [address]    Special Requests   Final    BOTTLES DRAWN AEROBIC ONLY Blood Culture adequate volume Performed at [facility], [address], [place], [place] [address]    Culture  Setup Time   Final    GRAM POSITIVE COCCI AEROBIC BOTTLE Gram Stain Report Called to,Read Back By and Verified With: MCMILLIAN,B_0  BY MATTHEWS, B 7.6.2020 [facility] Organism ID to follow CRITICAL RESULT CALLED TO, READ BACK BY AND VERIFIED WITHJacinto Reap [facility] RN 12/24/18 1856 JDW Performed at [facility], [place] [address] [name], [place] [address]    Culture STAPHYLOCOCCUS AUREUS (A)  Final   Report Status 12/26/2018 FINAL  Final   Organism ID, Bacteria STAPHYLOCOCCUS AUREUS  Final      Susceptibility   Staphylococcus aureus - MIC*    CIPROFLOXACIN 1 SENSITIVE Sensitive     ERYTHROMYCIN >=8 RESISTANT Resistant     GENTAMICIN <=0.5 SENSITIVE Sensitive     OXACILLIN 0.5 SENSITIVE Sensitive     TETRACYCLINE <=1 SENSITIVE Sensitive     VANCOMYCIN 1 SENSITIVE Sensitive     TRIMETH/SULFA <=10 SENSITIVE Sensitive     CLINDAMYCIN <=0.25 SENSITIVE Sensitive     RIFAMPIN <=0.5 SENSITIVE Sensitive     Inducible Clindamycin NEGATIVE Sensitive     * STAPHYLOCOCCUS AUREUS  Blood Culture ID Panel (Reflexed)     Status: Abnormal   Collection Time:  12/23/18  8:20 PM  Result Value Ref Range Status   Enterococcus species NOT DETECTED NOT DETECTED Final   Listeria monocytogenes NOT DETECTED NOT DETECTED Final   Staphylococcus species DETECTED (A) NOT DETECTED Final    Comment: CRITICAL RESULT CALLED TO, READ BACK BY AND VERIFIED WITH: B MCMILLIAN RN 12/24/18 1856 JDW    Staphylococcus aureus (BCID) DETECTED (A) NOT DETECTED Final    Comment: Methicillin (oxacillin) susceptible Staphylococcus aureus (MSSA). Preferred therapy is anti staphylococcal beta lactam antibiotic (Cefazolin or Nafcillin), unless clinically  contraindicated. CRITICAL RESULT CALLED TO, READ BACK BY AND VERIFIED WITH: B MCMILLIAN 12/24/18 1856 JDW    Methicillin resistance NOT DETECTED NOT DETECTED Final   Streptococcus species NOT DETECTED NOT DETECTED Final   Streptococcus agalactiae NOT DETECTED NOT DETECTED Final   Streptococcus pneumoniae NOT DETECTED NOT DETECTED Final   Streptococcus pyogenes NOT DETECTED NOT DETECTED Final   Acinetobacter baumannii NOT DETECTED NOT DETECTED Final   Enterobacteriaceae species NOT DETECTED NOT DETECTED Final   Enterobacter cloacae complex NOT DETECTED NOT DETECTED Final   Escherichia coli NOT DETECTED NOT DETECTED Final   Klebsiella oxytoca NOT DETECTED NOT DETECTED Final   Klebsiella pneumoniae NOT DETECTED NOT DETECTED Final   Proteus species NOT DETECTED NOT DETECTED Final   Serratia marcescens NOT DETECTED NOT DETECTED Final   Haemophilus influenzae NOT DETECTED NOT DETECTED Final   Neisseria meningitidis NOT DETECTED NOT DETECTED Final   Pseudomonas aeruginosa NOT DETECTED NOT DETECTED Final   Candida albicans NOT DETECTED NOT DETECTED Final   Candida glabrata NOT DETECTED NOT DETECTED Final   Candida krusei NOT DETECTED NOT DETECTED Final   Candida parapsilosis NOT DETECTED NOT DETECTED Final   Candida tropicalis NOT DETECTED NOT DETECTED Final    Comment: Performed at [facility], [place]. [address],  [place], [place] [address]  SARS Coronavirus 2 (CEPHEID- Performed in [place] hospital lab), Hosp Order     Status: None   Collection Time: 12/23/18  8:26 PM   Specimen: Nasopharyngeal Swab  Result Value Ref Range Status   SARS Coronavirus 2 NEGATIVE NEGATIVE Final    Comment: (NOTE) If result is NEGATIVE SARS-CoV-2 target nucleic acids are NOT DETECTED. The SARS-CoV-2 RNA is generally detectable in upper and lower  respiratory specimens during the acute phase of infection. The lowest  concentration of SARS-CoV-2 viral copies this assay can detect is 250  copies / mL. A negative result does not preclude SARS-CoV-2 infection  and should not be used as the sole basis for treatment or other  patient management decisions.  A negative result may occur with  improper specimen collection / handling, submission of specimen other  than nasopharyngeal swab, presence of viral mutation(s) within the  areas targeted by this assay, and inadequate number of viral copies  (<250 copies / mL). A negative result must be combined with clinical  observations, patient history, and epidemiological information. If result is POSITIVE SARS-CoV-2 target nucleic acids are DETECTED. The SARS-CoV-2 RNA is generally detectable in upper and lower  respiratory specimens dur ing the acute phase of infection.  Positive  results are indicative of active infection with SARS-CoV-2.  Clinical  correlation with patient history and other diagnostic information is  necessary to determine patient infection status.  Positive results do  not rule out bacterial infection or co-infection with other viruses. If result is PRESUMPTIVE POSTIVE SARS-CoV-2 nucleic acids MAY BE PRESENT.   A presumptive positive result was obtained on the submitted specimen  and confirmed on repeat testing.  While 2019 novel coronavirus  (SARS-CoV-2) nucleic acids may be present in the submitted sample  additional confirmatory testing may be necessary for  epidemiological  and / or clinical management purposes  to differentiate between  SARS-CoV-2 and other Sarbecovirus currently known to infect humans.  If clinically indicated additional testing with an alternate test  methodology [phone]) is advised. The SARS-CoV-2 RNA is generally  detectable in upper and lower respiratory sp ecimens during the acute  phase of infection. The expected result is  Negative. Fact Sheet for Patients:  StrictlyIdeas.no Fact Sheet for Healthcare Providers: BankingDealers.co.za This test is not yet approved or cleared by the [place] FDA and has been authorized for detection and/or diagnosis of SARS-CoV-2 by FDA under an Emergency Use Authorization (EUA).  This EUA will remain in effect (meaning this test can be used) for the duration of the COVID-19 declaration under Section 564(b)(1) of the Act, 21 U.S.C. section 360bbb-3(b)(1), unless the authorization is terminated or revoked sooner. Performed at [facility], [address], [place], [place] [address]   Culture, blood (routine x 2)     Status: None (Preliminary result)   Collection Time: 12/25/18  8:38 AM   Specimen: BLOOD RIGHT ARM  Result Value Ref Range Status   Specimen Description BLOOD RIGHT ARM  Final   Special Requests   Final    BOTTLES DRAWN AEROBIC AND ANAEROBIC Blood Culture adequate volume   Culture   Final    NO GROWTH < 24 HOURS Performed at [facility], [address] [place], [place] [address]    Report Status PENDING  Incomplete  Culture, blood (routine x 2)     Status: None (Preliminary result)   Collection Time: 12/25/18  8:39 AM   Specimen: BLOOD RIGHT FOREARM  Result Value Ref Range Status   Specimen Description BLOOD RIGHT FOREARM  Final   Special Requests   Final    BOTTLES DRAWN AEROBIC AND ANAEROBIC Blood Culture adequate volume   Culture   Final    NO GROWTH < 24 HOURS Performed at [facility], [address],  [place],  [address]    Report Status PENDING  Incomplete     Labs: BNP (last 3 results) Recent Labs    11/06/18 2257  BNP 628.3*   Basic Metabolic Panel: Recent Labs  Lab 12/23/18 2010 12/24/18 0611 12/25/18 0611 12/26/18 0508  NA 128* 130* 130* 133*  K 4.1 3.2* 3.9 4.4  CL 95* 100 97* 103  CO2 _0 GLUCOSE 170* 165* 131* 137*  BUN 10 7* 8 12  CREATININE 0.68 0.52 0.64 0.66  CALCIUM 8.5* 8.0* 8.4* 8.7*  MG  --   --  1.2* 1.7   Liver Function Tests: Recent Labs  Lab 12/23/18 2010 12/24/18 0611  AST 31 24  ALT 22 19  ALKPHOS 193* 162*  BILITOT 0.7 0.9  PROT 7.0 6.1*  ALBUMIN 3.2* 2.6*   No results for input(s): LIPASE, AMYLASE in the last 168 hours. No results for input(s): AMMONIA in the last 168 hours. CBC: Recent Labs  Lab 12/23/18 2010 12/24/18 0611 12/25/18 0611 12/26/18 0508  WBC 6.2 5.1 5.0 4.5  NEUTROABS 5.3  --   --   --   HGB 9.9* 9.8* 10.4* 10.0*  HCT 32.5* 31.1* 33.6* 32.6*  MCV 83.3 81.6 83.2 83.6  PLT 119* 102* 105* 109*   Cardiac Enzymes: No results for input(s): CKTOTAL, CKMB, CKMBINDEX, TROPONINI in the last 168 hours. BNP: Invalid input(s): POCBNP CBG: Recent Labs  Lab 12/25/18 1141 12/25/18 1630 12/25/18 2039 12/26/18 0902 12/26/18 1109  GLUCAP 146* 128* 115* 116* 117*   D-Dimer No results for input(s): DDIMER in the last 72 hours. Hgb A1c No results for input(s): HGBA1C in the last 72 hours. Lipid Profile No results for input(s): CHOL, HDL, LDLCALC, TRIG, CHOLHDL, LDLDIRECT in the last 72 hours. Thyroid function studies Recent Labs    12/24/18 0611  TSH 2.298   Anemia work up No results for  input(s): VITAMINB12, FOLATE, FERRITIN, TIBC, IRON, RETICCTPCT in the last 72 hours. Urinalysis    Component Value Date/Time   COLORURINE STRAW (A) 12/23/2018 1936   APPEARANCEUR CLEAR 12/23/2018 1936   LABSPEC 1.006 12/23/2018 1936   PHURINE 5.0 12/23/2018 1936   GLUCOSEU NEGATIVE 12/23/2018 1936   HGBUR  NEGATIVE 12/23/2018 [place] NEGATIVE 12/23/2018 1936   KETONESUR NEGATIVE 12/23/2018 1936   PROTEINUR NEGATIVE 12/23/2018 1936   UROBILINOGEN 0.2 07/21/2014 2230   NITRITE NEGATIVE 12/23/2018 1936   LEUKOCYTESUR NEGATIVE 12/23/2018 1936   Sepsis Labs Invalid input(s): PROCALCITONIN,  WBC,  LACTICIDVEN Microbiology Recent Results (from the past 240 hour(s))  Blood Culture (routine x 2)     Status: Abnormal   Collection Time: 12/23/18  8:11 PM   Specimen: BLOOD  Result Value Ref Range Status   Specimen Description   Final    BLOOD RIGHT ANTECUBITAL Performed at [facility], [place] [address], [place], [place] [address]    Special Requests   Final    BOTTLES DRAWN AEROBIC AND ANAEROBIC Blood Culture adequate volume Performed at [place] [place], [place] [address]    Culture  Setup Time   Final    GRAM POSITIVE COCCI AEROBIC BOTTLE ONLY Gram Stain Report Called to,Read Back By and Verified With: MCMILLIAN B. AT 1020A ON [id number] BY THOMPSON S. ANAEROBIC BOTTLE [facility] PREVIOUSLY CALLED MATTHEWS, B 7.6.2020 CRITICAL VALUE NOTED.  VALUE IS CONSISTENT WITH PREVIOUSLY REPORTED AND CALLED VALUE.    Culture (A)  Final    STAPHYLOCOCCUS AUREUS SUSCEPTIBILITIES PERFORMED ON PREVIOUS CULTURE WITHIN THE LAST 5 DAYS. Performed at [facility], [place] [address], [place], [place] [address]    Report Status 12/26/2018 FINAL  Final  Blood Culture (routine x 2)     Status: Abnormal   Collection Time: 12/23/18  8:20 PM   Specimen: BLOOD RIGHT ARM  Result Value Ref Range Status   Specimen Description   Final    BLOOD RIGHT ARM Performed at [facility], [place] [address], [place], [place] [address]    Special Requests   Final    BOTTLES DRAWN AEROBIC ONLY Blood Culture adequate volume Performed at [facility], [address], [place], [place] [address]    Culture  Setup Time   Final    GRAM POSITIVE COCCI AEROBIC BOTTLE Gram Stain Report Called  to,Read Back By and Verified With: MCMILLIAN,B_0  BY MATTHEWS, B 7.6.2020 [place] [facility] Organism ID to follow CRITICAL RESULT CALLED TO, READ BACK BY AND VERIFIED WITHJacinto Reap [facility] RN 12/24/18 1856 JDW Performed at [facility], [place] [address], [place], [place] [address]    Culture STAPHYLOCOCCUS AUREUS (A)  Final   Report Status 12/26/2018 FINAL  Final   Organism ID, Bacteria STAPHYLOCOCCUS AUREUS  Final      Susceptibility   Staphylococcus aureus - MIC*    CIPROFLOXACIN 1 SENSITIVE Sensitive     ERYTHROMYCIN >=8 RESISTANT Resistant     GENTAMICIN <=0.5 SENSITIVE Sensitive     OXACILLIN 0.5 SENSITIVE Sensitive     TETRACYCLINE <=1 SENSITIVE Sensitive     VANCOMYCIN 1 SENSITIVE Sensitive     TRIMETH/SULFA <=10 SENSITIVE Sensitive     CLINDAMYCIN <=0.25 SENSITIVE Sensitive     RIFAMPIN <=0.5 SENSITIVE Sensitive     Inducible Clindamycin NEGATIVE Sensitive     * STAPHYLOCOCCUS AUREUS  Blood Culture ID Panel (Reflexed)     Status: Abnormal   Collection Time:  12/23/18  8:20 PM  Result Value Ref Range Status   Enterococcus species NOT DETECTED NOT DETECTED Final   Listeria monocytogenes NOT DETECTED NOT DETECTED Final   Staphylococcus species DETECTED (A) NOT DETECTED Final    Comment: CRITICAL RESULT CALLED TO, READ BACK BY AND VERIFIED WITH: B MCMILLIAN RN 12/24/18 1856 JDW    Staphylococcus aureus (BCID) DETECTED (A) NOT DETECTED Final    Comment: Methicillin (oxacillin) susceptible Staphylococcus aureus (MSSA). Preferred therapy is anti staphylococcal beta lactam antibiotic (Cefazolin or Nafcillin), unless clinically contraindicated. CRITICAL RESULT CALLED TO, READ BACK BY AND VERIFIED WITH: B MCMILLIAN 12/24/18 1856 JDW    Methicillin resistance NOT DETECTED NOT DETECTED Final   Streptococcus species NOT DETECTED NOT DETECTED Final   Streptococcus agalactiae NOT DETECTED NOT DETECTED Final   Streptococcus pneumoniae NOT DETECTED NOT DETECTED Final   Streptococcus pyogenes  NOT DETECTED NOT DETECTED Final   Acinetobacter baumannii NOT DETECTED NOT DETECTED Final   Enterobacteriaceae species NOT DETECTED NOT DETECTED Final   Enterobacter cloacae complex NOT DETECTED NOT DETECTED Final   Escherichia coli NOT DETECTED NOT DETECTED Final   Klebsiella oxytoca NOT DETECTED NOT DETECTED Final   Klebsiella pneumoniae NOT DETECTED NOT DETECTED Final   Proteus species NOT DETECTED NOT DETECTED Final   Serratia marcescens NOT DETECTED NOT DETECTED Final   Haemophilus influenzae NOT DETECTED NOT DETECTED Final   Neisseria meningitidis NOT DETECTED NOT DETECTED Final   Pseudomonas aeruginosa NOT DETECTED NOT DETECTED Final   Candida albicans NOT DETECTED NOT DETECTED Final   Candida glabrata NOT DETECTED NOT DETECTED Final   Candida krusei NOT DETECTED NOT DETECTED Final   Candida parapsilosis NOT DETECTED NOT DETECTED Final   Candida tropicalis NOT DETECTED NOT DETECTED Final    Comment: Performed at [facility], [place]. [address], [place], [place] [address]  SARS Coronavirus 2 (CEPHEID- Performed in [place] hospital lab), Hosp Order     Status: None   Collection Time: 12/23/18  8:26 PM   Specimen: Nasopharyngeal Swab  Result Value Ref Range Status   SARS Coronavirus 2 NEGATIVE NEGATIVE Final    Comment: (NOTE) If result is NEGATIVE SARS-CoV-2 target nucleic acids are NOT DETECTED. The SARS-CoV-2 RNA is generally detectable in upper and lower  respiratory specimens during the acute phase of infection. The lowest  concentration of SARS-CoV-2 viral copies this assay can detect is 250  copies / mL. A negative result does not preclude SARS-CoV-2 infection  and should not be used as the sole basis for treatment or other  patient management decisions.  A negative result may occur with  improper specimen collection / handling, submission of specimen other  than nasopharyngeal swab, presence of viral mutation(s) within the  areas targeted by this assay, and  inadequate number of viral copies  (<250 copies / mL). A negative result must be combined with clinical  observations, patient history, and epidemiological information. If result is POSITIVE SARS-CoV-2 target nucleic acids are DETECTED. The SARS-CoV-2 RNA is generally detectable in upper and lower  respiratory specimens dur ing the acute phase of infection.  Positive  results are indicative of active infection with SARS-CoV-2.  Clinical  correlation with patient history and other diagnostic information is  necessary to determine patient infection status.  Positive results do  not rule out bacterial infection or co-infection with other viruses. If result is PRESUMPTIVE POSTIVE SARS-CoV-2 nucleic acids MAY BE PRESENT.   A presumptive positive result was obtained on the submitted specimen  and confirmed on repeat testing.  While 2019 novel coronavirus  (SARS-CoV-2) nucleic acids may be present in the submitted sample  additional confirmatory testing may be necessary for epidemiological  and / or clinical management purposes  to differentiate between  SARS-CoV-2 and other Sarbecovirus currently known to infect humans.  If clinically indicated additional testing with an alternate test  methodology [phone]) is advised. The SARS-CoV-2 RNA is generally  detectable in upper and lower respiratory sp ecimens during the acute  phase of infection. The expected result is Negative. Fact Sheet for Patients:  StrictlyIdeas.no Fact Sheet for Healthcare Providers: BankingDealers.co.za This test is not yet approved or cleared by the [place] FDA and has been authorized for detection and/or diagnosis of SARS-CoV-2 by FDA under an Emergency Use Authorization (EUA).  This EUA will remain in effect (meaning this test can be used) for the duration of the COVID-19 declaration under Section 564(b)(1) of the Act, 21 U.S.C. section 360bbb-3(b)(1), unless the  authorization is terminated or revoked sooner. Performed at [facility], [address], [place], [place] [address]   Culture, blood (routine x 2)     Status: None (Preliminary result)   Collection Time: 12/25/18  8:38 AM   Specimen: BLOOD RIGHT ARM  Result Value Ref Range Status   Specimen Description BLOOD RIGHT ARM  Final   Special Requests   Final    BOTTLES DRAWN AEROBIC AND ANAEROBIC Blood Culture adequate volume   Culture   Final    NO GROWTH < 24 HOURS Performed at [facility], [address] [place], [place] [address]    Report Status PENDING  Incomplete  Culture, blood (routine x 2)     Status: None (Preliminary result)   Collection Time: 12/25/18  8:39 AM   Specimen: BLOOD RIGHT FOREARM  Result Value Ref Range Status   Specimen Description BLOOD RIGHT FOREARM  Final   Special Requests   Final    BOTTLES DRAWN AEROBIC AND ANAEROBIC Blood Culture adequate volume   Culture   Final    NO GROWTH < 24 HOURS Performed at [facility], [address] [place], [place] [address]    Report Status PENDING  Incomplete     Time coordinating discharge: 35 minutes  SIGNED:   Rodena Goldmann, DO [facility] 12/26/2018, 2:42 PM  If 7PM-7AM, please contact night-coverage [email or web address] Password [email or web address]

## 2018-12-26 NOTE — TOC Initial Note (Signed)
Transition of Care [facility]) - Initial/Assessment Note    Patient Details  Name: [name] [name] MRN: [id number] Date of Birth: 1955-10-22  Transition of Care [facility]) CM/SW Contact:    Ihor Gully, LCSW Phone Number: 12/26/2018, 5:22 PM  Clinical Narrative:                 Patient lives home alone. Her 64 year old granddaughter stays with her during the summer. Patient has BSC, walker, wc, three canes in the home. Patient states that she can do the IV abx but is nervous about the PICC line as she was told it was painful.  Infusion set up with Emmit Pomfret at [place]. Sharrie Rothman also arranged RN for teaching/education. Advised of potential discharge today.   Expected Discharge Plan: [place] Barriers to Discharge: No Barriers Identified   Patient Goals and CMS Choice Patient states their goals for this hospitalization and ongoing recovery are:: To go home. CMS Medicare.gov Compare Post Acute Care list provided to:: Patient Choice offered to / list presented to : Patient  Expected Discharge Plan and Services Expected Discharge Plan: [place] Acute Care Choice: [place] arrangements for the past 2 months: Single Family Home Expected Discharge Date: 12/26/18                                    Prior Living Arrangements/Services Living arrangements for the past 2 months: Single Family Home Lives with:: Self Patient language and need for interpreter reviewed:: No          Care giver support system in place?: Yes (comment) Current home services: DME Criminal Activity/Legal Involvement Pertinent to Current Situation/Hospitalization: Yes - Comment as needed  Activities of Daily Living Home Assistive Devices/Equipment: Cane (specify quad or straight), Walker (specify type), CBG Meter, Wheelchair ADL Screening (condition at time of admission) Patient's cognitive ability adequate to safely complete daily activities?: Yes Is  the patient deaf or have difficulty hearing?: No Does the patient have difficulty seeing, even when wearing glasses/contacts?: No Does the patient have difficulty concentrating, remembering, or making decisions?: No Patient able to express need for assistance with ADLs?: Yes Does the patient have difficulty dressing or bathing?: Yes Independently performs ADLs?: No Communication: Independent Dressing (OT): Independent Grooming: Independent Feeding: Independent Bathing: Independent Toileting: Independent In/Out Bed: Independent Walks in Home: Independent with device (comment) Does the patient have difficulty walking or climbing stairs?: No Weakness of Legs: None Weakness of Arms/Hands: None  Permission Sought/Granted                  Emotional Assessment Appearance:: Appears stated age   Affect (typically observed): Apprehensive, Calm Orientation: : Oriented to Self, Oriented to Place, Oriented to  Time, Oriented to Situation Alcohol / Substance Use: Not Applicable Psych Involvement: No (comment)  Admission diagnosis:  Febrile illness, acute [R50.9] Sepsis ([place]) [A41.9] Patient Active Problem List   Diagnosis Date Noted  . Fever 12/24/2018  . Hyponatremia 12/24/2018  . Abnormal liver function 12/24/2018  . Cellulitis left lower extremity.  11/11/2018  . Sepsis due to undetermined organism ([place]) 11/07/2018  . Atrial fibrillation ([place]) 11/07/2018  . COPD (chronic obstructive pulmonary disease) ([place]) 11/07/2018  . GERD (gastroesophageal reflux disease) 11/07/2018  . Hypothyroidism 11/07/2018  . Sleep apnea 11/07/2018  . Hepatic cirrhosis ([place]) 09/01/2014  . Cirrhosis, nonalcoholic ([place]) 38/93/7342  .  Incisional hernia 06/09/2014  . S/P complete repair of rotator cuff 07/24/2012  . Precordial pain 09/30/2010  . Essential hypertension, benign 09/30/2010  . Type 2 diabetes mellitus ([place]) 09/05/2008  . OBESITY-MORBID (>100') 09/05/2008  . ESOPHAGEAL REFLUX 09/05/2008   . EDEMA 09/05/2008   PCP:  Redmond School, MD Pharmacy:   [facility], [place] [address] Phone: [phone] Fax: [phone]     Social Determinants of Health (SDOH) Interventions    Readmission Risk Interventions No flowsheet data found.

## 2018-12-26 NOTE — Progress Notes (Signed)
Corrected Potassium med instructions on the first printed AVS in Ink (copy pt has in hand).The second AVS printed -corrected in the system (not given to pt).

## 2018-12-26 NOTE — Progress Notes (Signed)
PHARMACY CONSULT NOTE FOR:  OUTPATIENT  PARENTERAL ANTIBIOTIC THERAPY (OPAT)  Indication: MSSA Bacteremia Regimen: Cefazolin 2gm IV Q8H End date: 01/07/19  IV antibiotic discharge orders are pended. To discharging provider:  please sign these orders via discharge navigator,  Select New Orders & click on the button choice - Manage This Unsigned Work.     Thank you for allowing pharmacy to be a part of this patient's care.  Isac Sarna, BS [place], BCPS Clinical Pharmacist Pager [phone] 12/26/2018, 10:39 AM

## 2018-12-26 NOTE — Transfer of Care (Signed)
Immediate Anesthesia Transfer of Care Note  Patient: [name] [name]  Procedure(s) Performed: TRANSESOPHAGEAL ECHOCARDIOGRAM (TEE) WITH PROPOFOL (N/A )  Patient Location: PACU  Anesthesia Type:MAC  Level of Consciousness: awake, oriented, drowsy and patient cooperative  Airway & Oxygen Therapy: Patient Spontanous Breathing and Patient connected to nasal cannula oxygen  Post-op Assessment: Report given to RN and Post -op Vital signs reviewed and stable  Post vital signs: Reviewed and stable  Last Vitals:  Vitals Value Taken Time  BP 136/59 12/26/18 0811  Temp 36.9 C 12/26/18 0811  Pulse 83 12/26/18 0814  Resp 14 12/26/18 0814  SpO2 100 % 12/26/18 0814  Vitals shown include unvalidated device data.  Last Pain:  Vitals:   12/26/18 0647  TempSrc: Oral  PainSc: 10-Worst pain ever      Patients Stated Pain Goal: 3 (19/14/78 2956)  Complications: No apparent anesthesia complications

## 2018-12-26 NOTE — Anesthesia Preprocedure Evaluation (Signed)
Anesthesia Evaluation  Patient identified by MRN, date of birth, ID band Patient awake    Reviewed: Allergy & Precautions, NPO status , Patient's Chart, lab work & pertinent test results  Airway Mallampati: II  TM Distance: >3 FB Neck ROM: Full    Dental no notable dental hx. (+) Missing, Loose, Poor Dentition, Chipped   Pulmonary asthma , sleep apnea , COPD,  COPD inhaler,  Pt reports [place] with CPAP Denies inhaler use    Pulmonary exam normal breath sounds clear to auscultation       Cardiovascular Exercise Tolerance: Poor hypertension, Pt. on medications and Pt. on home beta blockers + CAD  Normal cardiovascular examII Rhythm:Regular Rate:Normal  Limited by weight -states cares for herself  Here for TEE   Neuro/Psych Anxiety Depression negative neurological ROS  negative psych ROS   GI/Hepatic Neg liver ROS, GERD  Medicated and Controlled,  Endo/Other  diabetes, Type 1, Insulin Dependent, Oral Hypoglycemic AgentsHypothyroidism   Renal/GU negative Renal ROS  negative genitourinary   Musculoskeletal  (+) Arthritis , Osteoarthritis,    Abdominal   Peds negative pediatric ROS (+)  Hematology negative hematology ROS (+) anemia ,   Anesthesia Other Findings   Reproductive/Obstetrics negative OB ROS                             Anesthesia Physical Anesthesia Plan  ASA: IV  Anesthesia Plan: MAC   Post-op Pain Management:    Induction: Intravenous  PONV Risk Score and Plan: 2 and Ondansetron, Treatment may vary due to age or medical condition, Propofol infusion and TIVA  Airway Management Planned: Nasal Cannula and Simple Face Mask  Additional Equipment:   Intra-op Plan:   Post-operative Plan:   Informed Consent: I have reviewed the patients History and Physical, chart, labs and discussed the procedure including the risks, benefits and alternatives for the proposed anesthesia with  the patient or authorized representative who has indicated his/her understanding and acceptance.     Dental advisory given  Plan Discussed with: CRNA  Anesthesia Plan Comments: (Plan Full PPE use  Plan MAC as tolerated GETA as needed )        Anesthesia Quick Evaluation

## 2018-12-26 NOTE — Progress Notes (Signed)
Peripherally Inserted Central Catheter/Midline Placement  The IV Nurse has discussed with the patient and/or persons authorized to consent for the patient, the purpose of this procedure and the potential benefits and risks involved with this procedure.  The benefits include less needle sticks, lab draws from the catheter, and the patient may be discharged home with the catheter. Risks include, but not limited to, infection, bleeding, blood clot (thrombus formation), and puncture of an artery; nerve damage and irregular heartbeat and possibility to perform a PICC exchange if needed/ordered by physician.  Alternatives to this procedure were also discussed.  Bard Power PICC patient education guide, fact sheet on infection prevention and patient information card has been provided to patient /or left at bedside.    PICC/Midline Placement Documentation  PICC Single Lumen 62/56/38 PICC Right Basilic 41 cm 0 cm (Active)  Indication for Insertion or Continuance of Line Home intravenous therapies (PICC only) 12/26/18 1700  Exposed Catheter (cm) 0 cm 12/26/18 1700  Site Assessment Clean;Dry;Intact 12/26/18 1700  Line Status Flushed;Saline locked;Blood return noted 12/26/18 1700  Dressing Type Transparent;Securing device 12/26/18 1700  Dressing Status Clean;Dry;Intact;Antimicrobial disc in place 12/26/18 [place] checked and tightened 12/26/18 1700  Dressing Intervention New dressing 12/26/18 1700  Dressing Change Due 01/02/19 12/26/18 1700       Virgilio Belling 12/26/2018, 5:48 PM

## 2018-12-27 ENCOUNTER — Ambulatory Visit (HOSPITAL_COMMUNITY): Payer: PPO

## 2018-12-27 ENCOUNTER — Encounter (HOSPITAL_COMMUNITY): Payer: Self-pay | Admitting: Cardiology

## 2018-12-27 DIAGNOSIS — R7881 Bacteremia: Secondary | ICD-10-CM | POA: Diagnosis not present

## 2018-12-30 LAB — CULTURE, BLOOD (ROUTINE X 2)
Culture: NO GROWTH
Culture: NO GROWTH
Special Requests: ADEQUATE
Special Requests: ADEQUATE

## 2019-01-01 DIAGNOSIS — L0390 Cellulitis, unspecified: Secondary | ICD-10-CM | POA: Diagnosis not present

## 2019-01-01 DIAGNOSIS — Z6841 Body Mass Index (BMI) 40.0 and over, adult: Secondary | ICD-10-CM | POA: Diagnosis not present

## 2019-01-01 DIAGNOSIS — E611 Iron deficiency: Secondary | ICD-10-CM | POA: Diagnosis not present

## 2019-01-01 DIAGNOSIS — L03119 Cellulitis of unspecified part of limb: Secondary | ICD-10-CM | POA: Diagnosis not present

## 2019-01-01 DIAGNOSIS — A412 Sepsis due to unspecified staphylococcus: Secondary | ICD-10-CM | POA: Diagnosis not present

## 2019-01-02 ENCOUNTER — Other Ambulatory Visit: Payer: Self-pay

## 2019-01-02 ENCOUNTER — Encounter (HOSPITAL_COMMUNITY)
Admission: RE | Admit: 2019-01-02 | Discharge: 2019-01-02 | Disposition: A | Discharge status: Home / Self Care | Payer: PPO | Source: Ambulatory Visit | Attending: Internal Medicine | Admitting: Internal Medicine

## 2019-01-02 DIAGNOSIS — A412 Sepsis due to unspecified staphylococcus: Secondary | ICD-10-CM | POA: Insufficient documentation

## 2019-01-02 DIAGNOSIS — Z452 Encounter for adjustment and management of vascular access device: Secondary | ICD-10-CM | POA: Diagnosis not present

## 2019-01-02 DIAGNOSIS — R7881 Bacteremia: Secondary | ICD-10-CM | POA: Diagnosis not present

## 2019-01-02 MED ORDER — HEPARIN SOD (PORK) LOCK FLUSH 100 UNIT/ML IV SOLN
250.0000 [IU] | INTRAVENOUS | Status: DC | PRN
  Filled 2019-01-02: qty 5

## 2019-01-02 MED ORDER — SODIUM CHLORIDE 0.9 % IV SOLN
1.0000 g | INTRAVENOUS | Status: DC
  Administered 2019-01-02: 2 g via INTRAVENOUS

## 2019-01-02 MED ORDER — SODIUM CHLORIDE 0.9 % IV SOLN
INTRAVENOUS | Status: AC
  Filled 2019-01-02: qty 20

## 2019-01-02 MED ORDER — SODIUM CHLORIDE 0.9 % IV SOLN
Freq: Once | INTRAVENOUS | Status: AC
  Administered 2019-01-02: 13:00:00 via INTRAVENOUS

## 2019-01-02 MED ORDER — SODIUM CHLORIDE 0.9 % IV SOLN: 1.0000 g | Freq: Once | INTRAVENOUS | Status: DC

## 2019-01-02 MED ORDER — SODIUM CHLORIDE 0.9 % IV SOLN: 2.0000 g | Freq: Once | INTRAVENOUS | Status: DC

## 2019-01-02 MED ORDER — HEPARIN SOD (PORK) LOCK FLUSH 100 UNIT/ML IV SOLN
250.0000 [IU] | INTRAVENOUS | Status: AC | PRN
  Administered 2019-01-02: 250 [IU]

## 2019-01-02 NOTE — Progress Notes (Signed)
01/02/2019 1335 Lorie R[facility] called and requested that we contact Dr Gerarda Fraction re: Rocephin 2 gm needed instead of 1 gm. I contacted Dr Nolon Rod office and an order for rocephin 2 gm was given. I infused an additional 1 gm of rocephin for total dose of 2 gm as ordered.

## 2019-01-03 ENCOUNTER — Encounter (HOSPITAL_COMMUNITY): Payer: Self-pay

## 2019-01-03 ENCOUNTER — Encounter (HOSPITAL_COMMUNITY)
Admission: RE | Admit: 2019-01-03 | Discharge: 2019-01-03 | Disposition: A | Discharge status: Home / Self Care | Payer: PPO | Source: Ambulatory Visit | Attending: Internal Medicine | Admitting: Internal Medicine

## 2019-01-03 DIAGNOSIS — A412 Sepsis due to unspecified staphylococcus: Secondary | ICD-10-CM | POA: Diagnosis not present

## 2019-01-03 MED ORDER — SODIUM CHLORIDE 0.9 % IV SOLN
2.0000 g | Freq: Once | INTRAVENOUS | Status: AC
  Administered 2019-01-03: 13:00:00 2 g via INTRAVENOUS
  Filled 2019-01-03: qty 20

## 2019-01-03 MED ORDER — SODIUM CHLORIDE 0.9 % IV SOLN
INTRAVENOUS | Status: DC
  Administered 2019-01-03: 13:00:00 via INTRAVENOUS

## 2019-01-04 ENCOUNTER — Other Ambulatory Visit: Payer: Self-pay

## 2019-01-04 ENCOUNTER — Encounter (HOSPITAL_COMMUNITY)
Admission: RE | Admit: 2019-01-04 | Discharge: 2019-01-04 | Disposition: A | Discharge status: Home / Self Care | Payer: PPO | Source: Ambulatory Visit | Attending: Internal Medicine | Admitting: Internal Medicine

## 2019-01-04 DIAGNOSIS — A412 Sepsis due to unspecified staphylococcus: Secondary | ICD-10-CM | POA: Diagnosis not present

## 2019-01-04 DIAGNOSIS — S82001A Unspecified fracture of right patella, initial encounter for closed fracture: Secondary | ICD-10-CM | POA: Diagnosis not present

## 2019-01-04 MED ORDER — SODIUM CHLORIDE 0.9 % IV SOLN
2.0000 g | Freq: Once | INTRAVENOUS | Status: AC
  Administered 2019-01-04: 13:00:00 2 g via INTRAVENOUS

## 2019-01-04 MED ORDER — SODIUM CHLORIDE 0.9 % IV SOLN: Freq: Once | INTRAVENOUS | Status: DC

## 2019-01-04 MED ORDER — SODIUM CHLORIDE 0.9 % IV SOLN
INTRAVENOUS | Status: AC
  Filled 2019-01-04: qty 20

## 2019-01-04 MED ORDER — SODIUM CHLORIDE 0.9 % IV SOLN: 2.0000 g | Freq: Once | INTRAVENOUS | Status: DC

## 2019-01-04 MED ORDER — HEPARIN SOD (PORK) LOCK FLUSH 100 UNIT/ML IV SOLN
INTRAVENOUS | Status: AC
  Filled 2019-01-04: qty 5

## 2019-01-05 ENCOUNTER — Emergency Department (HOSPITAL_COMMUNITY)
Admission: EM | Admit: 2019-01-05 | Discharge: 2019-01-05 | Discharge status: Absent without leave | Payer: PPO | Source: Home / Self Care

## 2019-01-05 ENCOUNTER — Encounter (HOSPITAL_COMMUNITY)
Admission: RE | Admit: 2019-01-05 | Discharge: 2019-01-05 | Disposition: A | Discharge status: Home / Self Care | Payer: PPO | Source: Ambulatory Visit | Attending: Internal Medicine | Admitting: Internal Medicine

## 2019-01-05 DIAGNOSIS — A412 Sepsis due to unspecified staphylococcus: Secondary | ICD-10-CM | POA: Diagnosis not present

## 2019-01-05 MED ORDER — SODIUM CHLORIDE 0.9 % IV SOLN: Freq: Once | INTRAVENOUS | Status: DC

## 2019-01-05 MED ORDER — SODIUM CHLORIDE 0.9 % IV SOLN
2.0000 g | Freq: Once | INTRAVENOUS | Status: AC
  Administered 2019-01-05: 2 g via INTRAVENOUS
  Filled 2019-01-05: qty 20

## 2019-01-06 ENCOUNTER — Other Ambulatory Visit: Payer: Self-pay

## 2019-01-06 ENCOUNTER — Encounter (HOSPITAL_COMMUNITY)
Admission: RE | Admit: 2019-01-06 | Discharge: 2019-01-06 | Disposition: A | Discharge status: Home / Self Care | Payer: PPO | Source: Ambulatory Visit | Attending: Internal Medicine | Admitting: Internal Medicine

## 2019-01-06 DIAGNOSIS — A412 Sepsis due to unspecified staphylococcus: Secondary | ICD-10-CM | POA: Diagnosis not present

## 2019-01-06 MED ORDER — SODIUM CHLORIDE 0.9 % IV SOLN
2.0000 g | INTRAVENOUS | Status: DC
  Administered 2019-01-06: 2 g via INTRAVENOUS
  Filled 2019-01-06 (×2): qty 20

## 2019-01-07 ENCOUNTER — Encounter (HOSPITAL_COMMUNITY)
Admission: RE | Admit: 2019-01-07 | Discharge: 2019-01-07 | Disposition: A | Discharge status: Home / Self Care | Payer: PPO | Source: Ambulatory Visit | Attending: Internal Medicine | Admitting: Internal Medicine

## 2019-01-07 ENCOUNTER — Ambulatory Visit
Admission: RE | Admit: 2019-01-07 | Discharge: 2019-01-07 | Disposition: A | Discharge status: Home / Self Care | Payer: Self-pay | Source: Ambulatory Visit | Attending: Internal Medicine | Admitting: Internal Medicine

## 2019-01-07 ENCOUNTER — Ambulatory Visit (HOSPITAL_COMMUNITY)
Admission: RE | Admit: 2019-01-07 | Discharge: 2019-01-07 | Disposition: A | Discharge status: Home / Self Care | Payer: PPO | Source: Ambulatory Visit | Attending: Internal Medicine | Admitting: Internal Medicine

## 2019-01-07 DIAGNOSIS — Z95828 Presence of other vascular implants and grafts: Secondary | ICD-10-CM

## 2019-01-07 DIAGNOSIS — Z452 Encounter for adjustment and management of vascular access device: Secondary | ICD-10-CM | POA: Diagnosis not present

## 2019-01-07 DIAGNOSIS — A412 Sepsis due to unspecified staphylococcus: Secondary | ICD-10-CM | POA: Diagnosis not present

## 2019-01-07 MED ORDER — SODIUM CHLORIDE 0.9 % IV SOLN
INTRAVENOUS | Status: DC
  Administered 2019-01-07: 16:00:00 via INTRAVENOUS

## 2019-01-07 MED ORDER — SODIUM CHLORIDE 0.9 % IV SOLN
INTRAVENOUS | Status: AC
  Filled 2019-01-07: qty 20

## 2019-01-07 MED ORDER — HEPARIN SOD (PORK) LOCK FLUSH 100 UNIT/ML IV SOLN
INTRAVENOUS | Status: AC
  Filled 2019-01-07: qty 5

## 2019-01-07 MED ORDER — SODIUM CHLORIDE 0.9 % IV SOLN
2.0000 g | Freq: Once | INTRAVENOUS | Status: AC
  Administered 2019-01-07: 16:00:00 2 g via INTRAVENOUS

## 2019-01-07 MED ORDER — HEPARIN SOD (PORK) LOCK FLUSH 100 UNIT/ML IV SOLN
250.0000 [IU] | INTRAVENOUS | Status: AC | PRN
  Administered 2019-01-07: 250 [IU]

## 2019-01-07 MED ORDER — SODIUM CHLORIDE 0.9% FLUSH
INTRAVENOUS | Status: AC
  Filled 2019-01-07: qty 10

## 2019-01-07 NOTE — Progress Notes (Signed)
01/07/2019 1245-CXR done.

## 2019-01-07 NOTE — Progress Notes (Signed)
Patient current PICC line found to have 12cm exposed. PICC unable to be used.  Patient RN obtained PICC exchange order.  PICC exchanged successfully using ECG placement.  Patient tolerated procedure well.

## 2019-01-07 NOTE — Progress Notes (Signed)
01/07/2019 0935 Samantha from Dr Fusco's office called. I informed of status of PICC line. Dr Gerarda Fraction ordered CXR when pt arrives.

## 2019-01-07 NOTE — Progress Notes (Signed)
01/07/2019 0815-pt called. Stated "The nurses this weekend said my PICC line was out 2 inches. They started an IV to give me my rocephin."  I contacted Dr Nolon Rod office and left a message to return call.

## 2019-01-07 NOTE — Progress Notes (Signed)
01/07/2019 1457-Resting quietly. Sleeping in recliner at intervals. Continues waiting on IV team consult.

## 2019-01-07 NOTE — Progress Notes (Signed)
01/07/19-1541-IV nurse here. Pt transferred to PACU and placed on stretcher. PICC line evaluated and needs to be exchanged. PICC line exchange done. Pt tolerated well.

## 2019-01-07 NOTE — Progress Notes (Signed)
01/07/2019 1310-Dr Bowes phoned. PICC line not in correct position. Needs to be repositioned. IV team at [facility] notified. Order for IV team consult given. Will wait for further instructions.

## 2019-01-08 ENCOUNTER — Other Ambulatory Visit: Payer: Self-pay

## 2019-01-08 ENCOUNTER — Encounter (HOSPITAL_COMMUNITY)
Admission: RE | Admit: 2019-01-08 | Discharge: 2019-01-08 | Disposition: A | Discharge status: Home / Self Care | Payer: PPO | Source: Ambulatory Visit | Attending: Internal Medicine | Admitting: Internal Medicine

## 2019-01-08 DIAGNOSIS — A412 Sepsis due to unspecified staphylococcus: Secondary | ICD-10-CM | POA: Diagnosis not present

## 2019-01-08 MED ORDER — SODIUM CHLORIDE 0.9 % IV SOLN
INTRAVENOUS | Status: DC
  Administered 2019-01-08: 12:00:00 via INTRAVENOUS

## 2019-01-08 MED ORDER — HEPARIN SOD (PORK) LOCK FLUSH 100 UNIT/ML IV SOLN
250.0000 [IU] | INTRAVENOUS | Status: AC | PRN
  Administered 2019-01-08: 250 [IU]

## 2019-01-08 MED ORDER — HEPARIN SOD (PORK) LOCK FLUSH 100 UNIT/ML IV SOLN
INTRAVENOUS | Status: AC
  Filled 2019-01-08: qty 15

## 2019-01-08 MED ORDER — SODIUM CHLORIDE 0.9 % IV SOLN
2.0000 g | Freq: Once | INTRAVENOUS | Status: AC
  Administered 2019-01-08: 2 g via INTRAVENOUS

## 2019-01-08 MED ORDER — SODIUM CHLORIDE 0.9% FLUSH
INTRAVENOUS | Status: AC
  Filled 2019-01-08: qty 70

## 2019-01-08 MED ORDER — SODIUM CHLORIDE 0.9 % IV SOLN
INTRAVENOUS | Status: AC
  Filled 2019-01-08: qty 20

## 2019-01-08 MED ORDER — SODIUM CHLORIDE 0.9% FLUSH: 10.0000 mL | INTRAVENOUS | Status: DC | PRN

## 2019-01-09 ENCOUNTER — Other Ambulatory Visit: Payer: Self-pay

## 2019-01-09 ENCOUNTER — Encounter (HOSPITAL_COMMUNITY)
Admission: RE | Admit: 2019-01-09 | Discharge: 2019-01-09 | Disposition: A | Discharge status: Home / Self Care | Payer: PPO | Source: Ambulatory Visit | Attending: Internal Medicine | Admitting: Internal Medicine

## 2019-01-09 DIAGNOSIS — A412 Sepsis due to unspecified staphylococcus: Secondary | ICD-10-CM | POA: Diagnosis not present

## 2019-01-09 MED ORDER — SODIUM CHLORIDE 0.9 % IV SOLN
INTRAVENOUS | Status: DC
  Administered 2019-01-09: 12:00:00 via INTRAVENOUS

## 2019-01-09 MED ORDER — SODIUM CHLORIDE 0.9 % IV SOLN
INTRAVENOUS | Status: AC
  Filled 2019-01-09: qty 20

## 2019-01-09 MED ORDER — SODIUM CHLORIDE 0.9 % IV SOLN
2.0000 g | Freq: Once | INTRAVENOUS | Status: AC
  Administered 2019-01-09: 2 g via INTRAVENOUS

## 2019-01-09 MED ORDER — HEPARIN SOD (PORK) LOCK FLUSH 100 UNIT/ML IV SOLN
INTRAVENOUS | Status: AC
  Filled 2019-01-09: qty 5

## 2019-01-09 MED ORDER — HEPARIN SOD (PORK) LOCK FLUSH 100 UNIT/ML IV SOLN
250.0000 [IU] | INTRAVENOUS | Status: AC | PRN
  Administered 2019-01-09: 250 [IU]

## 2019-01-10 ENCOUNTER — Other Ambulatory Visit: Payer: Self-pay

## 2019-01-10 ENCOUNTER — Encounter (HOSPITAL_COMMUNITY): Payer: Self-pay

## 2019-01-10 ENCOUNTER — Encounter (HOSPITAL_COMMUNITY)
Admission: RE | Admit: 2019-01-10 | Discharge: 2019-01-10 | Disposition: A | Discharge status: Home / Self Care | Payer: PPO | Source: Ambulatory Visit | Attending: Internal Medicine | Admitting: Internal Medicine

## 2019-01-10 DIAGNOSIS — A412 Sepsis due to unspecified staphylococcus: Secondary | ICD-10-CM | POA: Diagnosis not present

## 2019-01-10 MED ORDER — SODIUM CHLORIDE 0.9 % IV SOLN
INTRAVENOUS | Status: AC
  Filled 2019-01-10: qty 20

## 2019-01-10 MED ORDER — HEPARIN SOD (PORK) LOCK FLUSH 100 UNIT/ML IV SOLN
250.0000 [IU] | Freq: Once | INTRAVENOUS | Status: AC
  Administered 2019-01-10: 250 [IU]

## 2019-01-10 MED ORDER — SODIUM CHLORIDE 0.9 % IV SOLN
INTRAVENOUS | Status: DC
  Administered 2019-01-10: 12:00:00 via INTRAVENOUS

## 2019-01-10 MED ORDER — SODIUM CHLORIDE 0.9 % IV SOLN
2.0000 g | Freq: Once | INTRAVENOUS | Status: AC
  Administered 2019-01-10: 2 g via INTRAVENOUS

## 2019-01-10 MED ORDER — HEPARIN SOD (PORK) LOCK FLUSH 100 UNIT/ML IV SOLN
INTRAVENOUS | Status: AC
  Filled 2019-01-10: qty 5

## 2019-01-11 ENCOUNTER — Encounter (HOSPITAL_COMMUNITY)
Admission: RE | Admit: 2019-01-11 | Discharge: 2019-01-11 | Disposition: A | Discharge status: Home / Self Care | Payer: PPO | Source: Ambulatory Visit | Attending: Internal Medicine | Admitting: Internal Medicine

## 2019-01-11 DIAGNOSIS — A412 Sepsis due to unspecified staphylococcus: Secondary | ICD-10-CM | POA: Diagnosis not present

## 2019-01-11 MED ORDER — HEPARIN SOD (PORK) LOCK FLUSH 100 UNIT/ML IV SOLN
250.0000 [IU] | INTRAVENOUS | Status: AC | PRN
  Administered 2019-01-11: 250 [IU]

## 2019-01-11 MED ORDER — SODIUM CHLORIDE 0.9 % IV SOLN
Freq: Once | INTRAVENOUS | Status: AC
  Administered 2019-01-11: 13:00:00 via INTRAVENOUS

## 2019-01-11 MED ORDER — SODIUM CHLORIDE 0.9 % IV SOLN
2.0000 g | Freq: Once | INTRAVENOUS | Status: AC
  Administered 2019-01-11: 2 g via INTRAVENOUS

## 2019-01-12 ENCOUNTER — Encounter (HOSPITAL_COMMUNITY)
Admission: RE | Admit: 2019-01-12 | Discharge: 2019-01-12 | Disposition: A | Discharge status: Home / Self Care | Payer: PPO | Source: Ambulatory Visit | Attending: Internal Medicine | Admitting: Internal Medicine

## 2019-01-12 DIAGNOSIS — A412 Sepsis due to unspecified staphylococcus: Secondary | ICD-10-CM | POA: Diagnosis not present

## 2019-01-12 MED ORDER — SODIUM CHLORIDE 0.9 % IV SOLN
INTRAVENOUS | Status: DC
  Administered 2019-01-12: 12:00:00 via INTRAVENOUS

## 2019-01-12 MED ORDER — SODIUM CHLORIDE 0.9 % IV SOLN
2.0000 g | Freq: Once | INTRAVENOUS | Status: AC
  Administered 2019-01-12: 2 g via INTRAVENOUS
  Filled 2019-01-12: qty 20

## 2019-01-12 MED ORDER — HEPARIN SOD (PORK) LOCK FLUSH 100 UNIT/ML IV SOLN
250.0000 [IU] | Freq: Once | INTRAVENOUS | Status: AC
  Administered 2019-01-12: 250 [IU]

## 2019-01-12 NOTE — Progress Notes (Signed)
Pt vitals WNL upon arrival. Abx started and completed with no difficulty. Pt PICC line flushed before D/C.

## 2019-01-13 ENCOUNTER — Other Ambulatory Visit: Payer: Self-pay

## 2019-01-13 ENCOUNTER — Encounter (HOSPITAL_COMMUNITY)
Admission: RE | Admit: 2019-01-13 | Discharge: 2019-01-13 | Disposition: A | Discharge status: Home / Self Care | Payer: PPO | Source: Ambulatory Visit | Attending: Internal Medicine | Admitting: Internal Medicine

## 2019-01-13 DIAGNOSIS — A412 Sepsis due to unspecified staphylococcus: Secondary | ICD-10-CM | POA: Diagnosis not present

## 2019-01-13 MED ORDER — SODIUM CHLORIDE 0.9 % IV SOLN
2.0000 g | Freq: Once | INTRAVENOUS | Status: AC
  Administered 2019-01-13: 2 g via INTRAVENOUS
  Filled 2019-01-13: qty 2

## 2019-01-13 MED ORDER — HEPARIN SOD (PORK) LOCK FLUSH 100 UNIT/ML IV SOLN: 250.0000 [IU] | Freq: Once | INTRAVENOUS | Status: DC

## 2019-01-14 ENCOUNTER — Encounter (HOSPITAL_COMMUNITY)
Admission: RE | Admit: 2019-01-14 | Discharge: 2019-01-14 | Disposition: A | Discharge status: Home / Self Care | Payer: PPO | Source: Ambulatory Visit | Attending: Internal Medicine | Admitting: Internal Medicine

## 2019-01-14 ENCOUNTER — Other Ambulatory Visit: Payer: Self-pay

## 2019-01-14 DIAGNOSIS — A412 Sepsis due to unspecified staphylococcus: Secondary | ICD-10-CM | POA: Diagnosis not present

## 2019-01-14 MED ORDER — HEPARIN SOD (PORK) LOCK FLUSH 100 UNIT/ML IV SOLN
250.0000 [IU] | Freq: Once | INTRAVENOUS | Status: AC
  Administered 2019-01-14: 250 [IU] via INTRAVENOUS

## 2019-01-14 MED ORDER — SODIUM CHLORIDE 0.9 % IV SOLN
INTRAVENOUS | Status: DC
  Administered 2019-01-14 (×2): via INTRAVENOUS

## 2019-01-14 MED ORDER — SODIUM CHLORIDE 0.9 % IV SOLN
2.0000 g | Freq: Once | INTRAVENOUS | Status: AC
  Administered 2019-01-14: 2 g via INTRAVENOUS

## 2019-01-14 MED ORDER — SODIUM CHLORIDE 0.9 % IV SOLN
INTRAVENOUS | Status: AC
  Filled 2019-01-14: qty 20

## 2019-01-15 ENCOUNTER — Encounter (HOSPITAL_COMMUNITY)
Admission: RE | Admit: 2019-01-15 | Discharge: 2019-01-15 | Disposition: A | Discharge status: Home / Self Care | Payer: PPO | Source: Ambulatory Visit | Attending: Internal Medicine | Admitting: Internal Medicine

## 2019-01-15 ENCOUNTER — Encounter (HOSPITAL_COMMUNITY): Payer: Self-pay

## 2019-01-15 DIAGNOSIS — A412 Sepsis due to unspecified staphylococcus: Secondary | ICD-10-CM | POA: Diagnosis not present

## 2019-01-15 MED ORDER — HEPARIN SOD (PORK) LOCK FLUSH 100 UNIT/ML IV SOLN
INTRAVENOUS | Status: AC
  Filled 2019-01-15: qty 5

## 2019-01-15 MED ORDER — SODIUM CHLORIDE 0.9 % IV SOLN
2.0000 g | Freq: Once | INTRAVENOUS | Status: AC
  Administered 2019-01-15: 2 g via INTRAVENOUS

## 2019-01-15 MED ORDER — SODIUM CHLORIDE 0.9 % IV SOLN
INTRAVENOUS | Status: DC
  Administered 2019-01-15: 13:00:00 via INTRAVENOUS

## 2019-01-15 MED ORDER — SODIUM CHLORIDE 0.9 % IV SOLN
INTRAVENOUS | Status: AC
  Filled 2019-01-15: qty 20

## 2019-01-17 ENCOUNTER — Telehealth: Payer: Self-pay | Admitting: Student

## 2019-01-17 NOTE — Telephone Encounter (Signed)
Patient does not have scale or bp cuff      Virtual Visit Pre-Appointment Phone Call  "(Name), I am calling you today to discuss your upcoming appointment. We are currently trying to limit exposure to the virus that causes COVID-19 by seeing patients at home rather than in the office."  1. "What is the BEST phone number to call the day of the visit?" - include this in appointment notes  2. Do you have or have access to (through a family member/friend) a smartphone with video capability that we can use for your visit?" a. If yes - list this number in appt notes as cell (if different from BEST phone #) and list the appointment type as a VIDEO visit in appointment notes b. If no - list the appointment type as a PHONE visit in appointment notes  3. Confirm consent - "In the setting of the current Covid19 crisis, you are scheduled for a (phone or video) visit with your provider on (date) at (time).  Just as we do with many in-office visits, in order for you to participate in this visit, we must obtain consent.  If you'd like, I can send this to your mychart (if signed up) or email for you to review.  Otherwise, I can obtain your verbal consent now.  All virtual visits are billed to your insurance company just like a normal visit would be.  By agreeing to a virtual visit, we'd like you to understand that the technology does not allow for your provider to perform an examination, and thus may limit your provider's ability to fully assess your condition. If your provider identifies any concerns that need to be evaluated in person, we will make arrangements to do so.  Finally, though the technology is pretty good, we cannot assure that it will always work on either your or our end, and in the setting of a video visit, we may have to convert it to a phone-only visit.  In either situation, we cannot ensure that we have a secure connection.  Are you willing to proceed?" STAFF: Did the patient verbally  acknowledge consent to telehealth visit? Document YES/NO here: yes  4. Advise patient to be prepared - "Two hours prior to your appointment, go ahead and check your blood pressure, pulse, oxygen saturation, and your weight (if you have the equipment to check those) and write them all down. When your visit starts, your provider will ask you for this information. If you have an Apple Watch or Kardia device, please plan to have heart rate information ready on the day of your appointment. Please have a pen and paper handy nearby the day of the visit as well."  5. Give patient instructions for MyChart download to smartphone OR [email or web address] as below if video visit (depending on what platform provider is using)  6. Inform patient they will receive a phone call 15 minutes prior to their appointment time (may be from unknown caller ID) so they should be prepared to answer    TELEPHONE CALL NOTE  [name] [name] has been deemed a candidate for a follow-up tele-health visit to limit community exposure during the Covid-19 pandemic. I spoke with the patient via phone to ensure availability of phone/video source, confirm preferred email & phone number, and discuss instructions and expectations.  I reminded [name] [name] to be prepared with any vital sign and/or heart rhythm information that could potentially be obtained via home monitoring, at the time of her visit. I  reminded [name] [name] to expect a phone call prior to her visit.  Howie Ill 01/17/2019 10:54 AM   INSTRUCTIONS FOR DOWNLOADING THE MYCHART APP TO SMARTPHONE  - The patient must first make sure to have activated MyChart and know their login information - If Apple, go to [employer] and type in MyChart in the search bar and download the app. If Android, ask patient to go to [employer] and type in [place] in the search bar and download the app. The app is free but as with any other app downloads, their phone may require them to  verify saved payment information or Apple/Android password.  - The patient will need to then log into the app with their MyChart username and password, and select [facility] as their healthcare provider to link the account. When it is time for your visit, go to the MyChart app, find appointments, and click Begin Video Visit. Be sure to Select Allow for your device to access the Microphone and Camera for your visit. You will then be connected, and your provider will be with you shortly.  **If they have any issues connecting, or need assistance please contact MyChart service desk [phone])**  **If using a computer, in order to ensure the best quality for their visit they will need to use either of the following Internet Browsers: [employer], or Google Chrome**  IF USING DOXIMITY or DOXY.ME - The patient will receive a link just prior to their visit by text.     FULL LENGTH CONSENT FOR TELE-HEALTH VISIT   I hereby voluntarily request, consent and authorize [facility] and its employed or contracted physicians, physician assistants, nurse practitioners or other licensed health care professionals (the Practitioner), to provide me with telemedicine health care services (the Services") as deemed necessary by the treating Practitioner. I acknowledge and consent to receive the Services by the Practitioner via telemedicine. I understand that the telemedicine visit will involve communicating with the Practitioner through live audiovisual communication technology and the disclosure of certain medical information by electronic transmission. I acknowledge that I have been given the opportunity to request an in-person assessment or other available alternative prior to the telemedicine visit and am voluntarily participating in the telemedicine visit.  I understand that I have the right to withhold or withdraw my consent to the use of telemedicine in the course of my care at any time,  without affecting my right to future care or treatment, and that the Practitioner or I may terminate the telemedicine visit at any time. I understand that I have the right to inspect all information obtained and/or recorded in the course of the telemedicine visit and may receive copies of available information for a reasonable fee.  I understand that some of the potential risks of receiving the Services via telemedicine include:   Delay or interruption in medical evaluation due to technological equipment failure or disruption;  Information transmitted may not be sufficient (e.g. poor resolution of images) to allow for appropriate medical decision making by the Practitioner; and/or   In rare instances, security protocols could fail, causing a breach of personal health information.  Furthermore, I acknowledge that it is my responsibility to provide information about my medical history, conditions and care that is complete and accurate to the best of my ability. I acknowledge that Practitioner's advice, recommendations, and/or decision may be based on factors not within their control, such as incomplete or inaccurate data provided by me or distortions of  diagnostic images or specimens that may result from electronic transmissions. I understand that the practice of medicine is not an exact science and that Practitioner makes no warranties or guarantees regarding treatment outcomes. I acknowledge that I will receive a copy of this consent concurrently upon execution via email to the email address I last provided but may also request a printed copy by calling the office of [facility].    I understand that my insurance will be billed for this visit.   I have read or had this consent read to me.  I understand the contents of this consent, which adequately explains the benefits and risks of the Services being provided via telemedicine.   I have been provided ample opportunity to ask questions regarding  this consent and the Services and have had my questions answered to my satisfaction.  I give my informed consent for the services to be provided through the use of telemedicine in my medical care  By participating in this telemedicine visit I agree to the above.

## 2019-01-22 ENCOUNTER — Encounter: Payer: Self-pay | Admitting: Student

## 2019-01-22 ENCOUNTER — Other Ambulatory Visit: Payer: Self-pay

## 2019-01-22 ENCOUNTER — Telehealth (INDEPENDENT_AMBULATORY_CARE_PROVIDER_SITE_OTHER): Payer: PPO | Admitting: Student

## 2019-01-22 VITALS — Wt 273.0 lb

## 2019-01-22 DIAGNOSIS — R7881 Bacteremia: Secondary | ICD-10-CM | POA: Diagnosis not present

## 2019-01-22 DIAGNOSIS — E538 Deficiency of other specified B group vitamins: Secondary | ICD-10-CM | POA: Diagnosis not present

## 2019-01-22 DIAGNOSIS — Z6841 Body Mass Index (BMI) 40.0 and over, adult: Secondary | ICD-10-CM | POA: Diagnosis not present

## 2019-01-22 DIAGNOSIS — E611 Iron deficiency: Secondary | ICD-10-CM | POA: Diagnosis not present

## 2019-01-22 DIAGNOSIS — E782 Mixed hyperlipidemia: Secondary | ICD-10-CM

## 2019-01-22 DIAGNOSIS — I10 Essential (primary) hypertension: Secondary | ICD-10-CM

## 2019-01-22 DIAGNOSIS — B9561 Methicillin susceptible Staphylococcus aureus infection as the cause of diseases classified elsewhere: Secondary | ICD-10-CM

## 2019-01-22 DIAGNOSIS — I480 Paroxysmal atrial fibrillation: Secondary | ICD-10-CM

## 2019-01-22 DIAGNOSIS — L209 Atopic dermatitis, unspecified: Secondary | ICD-10-CM | POA: Diagnosis not present

## 2019-01-22 DIAGNOSIS — R630 Anorexia: Secondary | ICD-10-CM | POA: Diagnosis not present

## 2019-01-22 NOTE — Progress Notes (Signed)
Virtual Visit via Video Note   This visit type was conducted due to national recommendations for restrictions regarding the COVID-19 Pandemic (e.g. social distancing) in an effort to limit this patient's exposure and mitigate transmission in our community.  Due to her co-morbid illnesses, this patient is at least at moderate risk for complications without adequate follow up.  This format is felt to be most appropriate for this patient at this time.  All issues noted in this document were discussed and addressed.  A limited physical exam was performed with this format.  Please refer to the patient's chart for her consent to telehealth for [facility].   Date:  01/22/2019   ID:  [name] [name], DOB January 15, 1956, MRN [id number]  Patient Location: Home Provider Location: Office  PCP:  Redmond School, MD  Cardiologist:  Rozann Lesches, MD  Electrophysiologist:  None   Evaluation Performed:  Follow-Up Visit  Chief Complaint:  Hospital Follow-up  History of Present Illness:    [name] [name] is a 63 y.o. female with past medical history of paroxysmal atrial fibrillation, HTN, HLD, and IDDM who presents for a hospital follow-up telehealth visit.   She most recently had a phone visit with Dr. Domenic Polite in 10/2018 following a recent hospitalization for lower extremity cellulitis and staphylococcus viridans bacteremia. Denied any acute cardiac issues at that time but was weak from her hospitalization. No changes were made to her medication regimen at that time.   In the interim, she was again admitted to [facility] from 7/5 - 12/26/2018 for evaluation of headaches and fevers, found to have MSSA bacteremia. Transthoracic echocardiogram showed no acute findings and a TEE was performed by Dr. Harl Bowie on 12/26/2018 which showed a preserved EF of 55-60% with no evidence of vegetations. Repeat blood cultures were obtained and showed no growth, therefore a PICC was placed and she was discharged home and  continued on Ancef for an additional 12 days.    In talking with the patient today, she reports initially feeling fatigued following hospital discharge but says this has continued to improve. She was previously doing outpatient PT prior to her admission and plans to resume this next week. She has finished her IV antibiotic course and her PICC was removed earlier today.  She reports having baseline dyspnea on exertion which is typically worse in hot temperatures. She does not have central air in her home and uses box fans. She denies any specific orthopnea, PND, or lower extremity edema. No recent chest pain or palpitations.  She reports good compliance with Eliquis and denies missing any recent doses. No evidence of melena, hematochezia, or hematuria.  The patient does not have symptoms concerning for COVID-19 infection (fever, chills, cough, or new shortness of breath).    Past Medical History:  Diagnosis Date  . Anemia   . Anxiety   . Arthritis   . Asthma   . Atrial fibrillation ([place])   . Cirrhosis of liver ([place])   . COPD (chronic obstructive pulmonary disease) ([place])   . Coronary artery disease   . Depression   . Edema   . Essential hypertension, benign   . GERD (gastroesophageal reflux disease)   . Hypothyroidism   . Morbid obesity ([place])   . Overactive bladder   . Sleep apnea    CPAP - not consistent  . Thyroid cancer ([place])   . Type 2 diabetes mellitus ([place])    Past Surgical History:  Procedure Laterality Date  . "pump bumps"  bilateral heels--2000  . ABDOMINAL HYSTERECTOMY  1985  . CARDIAC CATHETERIZATION  2012   Dr. Lia Foyer told her nothing was wrong.  [name] [place]  . CHOLECYSTECTOMY  1996  . COLONOSCOPY WITH PROPOFOL N/A 04/17/2015   Procedure: COLONOSCOPY WITH PROPOFOL  at cecum at 0810; withdrawal time =15 minutes;  Surgeon: [name], MD;  Location: [facility];  Service: Endoscopy;  Laterality: N/A;  . Debridement of abdominal wound  2011  .  ESOPHAGEAL DILATION N/A 04/17/2015   Procedure: ESOPHAGEAL DILATION WITH 56FR MALONEY DILATOR;  Surgeon: [name], MD;  Location: [facility];  Service: Endoscopy;  Laterality: N/A;  . ESOPHAGOGASTRODUODENOSCOPY (EGD) WITH PROPOFOL N/A 04/17/2015   Procedure: ESOPHAGOGASTRODUODENOSCOPY (EGD) WITH PROPOFOL;  Surgeon: [name], MD;  Location: [facility];  Service: Endoscopy;  Laterality: N/A;  . EXPLORATORY LAPAROTOMY  2003  . FOOT SURGERY Bilateral 2000   Bone spur removed  . FOOT SURGERY    . INCISIONAL HERNIA REPAIR N/A 06/09/2014   Procedure: RECURRENT  INCISIONAL HERNIORRHAPHY WITH MESH;  Surgeon: [name], MD;  Location: [facility];  Service: General;  Laterality: N/A;  . INCISIONAL HERNIA REPAIR N/A 10/15/2014   Procedure: [place];  Surgeon: [name], MD;  Location: [place];  Service: General;  Laterality: N/A;  . INSERTION OF MESH N/A 06/09/2014   Procedure: INSERTION OF MESH;  Surgeon: [name], MD;  Location: [facility];  Service: General;  Laterality: N/A;  . INSERTION OF MESH N/A 10/15/2014   Procedure: INSERTION OF MESH;  Surgeon: [name], MD;  Location: [place];  Service: General;  Laterality: N/A;  . POLYPECTOMY  04/17/2015   Procedure: POLYPECTOMY;  Surgeon: [name], MD;  Location: [facility];  Service: Endoscopy;;  . RESECTION DISTAL CLAVICAL  05/07/2012   Procedure: RESECTION DISTAL CLAVICAL;  Surgeon: [name], MD;  Location: [facility];  Service: Orthopedics;  Laterality: Right;  . SHOULDER OPEN ROTATOR CUFF REPAIR  05/07/2012   Procedure: ROTATOR CUFF REPAIR SHOULDER OPEN;  Surgeon: [name], MD;  Location: [facility];  Service: Orthopedics;  Laterality: Right;  . TEE WITHOUT CARDIOVERSION N/A 12/26/2018   Procedure: TRANSESOPHAGEAL ECHOCARDIOGRAM (TEE) WITH PROPOFOL;  Surgeon: Arnoldo [place], MD;  [facility];  Service: Endoscopy;  Laterality: N/A;  . THYROIDECTOMY  2009  . TONSILLECTOMY  1958   . UMBILICAL HERNIA REPAIR  2010     Current Meds  Medication Sig  . albuterol (PROVENTIL HFA) 108 (90 BASE) MCG/ACT inhaler Inhale 2 puffs into the lungs every 6 (six) hours as needed for wheezing or shortness of breath. Shortness of breath  . apixaban (ELIQUIS) 5 MG TABS tablet Take 1 tablet (5 mg total) by mouth 2 (two) times daily.  . colesevelam (WELCHOL) 625 MG tablet Take 1,875 mg by mouth as directed. 3 tabs am 3 tabs pm  . diltiazem (CARDIZEM CD) 360 MG 24 hr capsule Take 1 capsule (360 mg total) by mouth daily.  . DULoxetine (CYMBALTA) 60 MG capsule Take 120 mg by mouth daily.   [name] econazole nitrate 1 % cream Apply topically daily. (Patient taking differently: Apply 1 application topically daily as needed (for rash irritation). )  . furosemide (LASIX) 20 MG tablet Take 20 mg by mouth daily as needed for fluid.   [name] gabapentin (NEURONTIN) 300 MG capsule Take 300 mg by mouth daily.  [name] HYDROcodone-acetaminophen (NORCO/VICODIN) 5-325 MG tablet Take 1 tablet by mouth every 4 (  four) hours as needed. (Patient taking differently: Take 1 tablet by mouth every 4 (four) hours as needed for moderate pain or severe pain. )  . hydrOXYzine (ATARAX/VISTARIL) 25 MG tablet Take 25 mg by mouth 4 (four) times daily as needed for itching.   [name] LANTUS SOLOSTAR 100 UNIT/ML Solostar Pen Inject 33 Units into the skin at bedtime.   [name] levothyroxine (SYNTHROID) 200 MCG tablet Take 200 mcg by mouth daily before breakfast. Take in addition to 75 mcg for a total of 275 mcg daily  . levothyroxine (SYNTHROID) 75 MCG tablet Take 50-75 mcg by mouth daily before breakfast. DOSE NEEDS TO BE VERIFIED!  [name] losartan (COZAAR) 100 MG tablet Take 100 mg by mouth daily.   . meloxicam (MOBIC) 15 MG tablet Take 15 mg by mouth daily.   . metFORMIN (GLUCOPHAGE) 1000 MG tablet Take 1,000 mg by mouth 2 (two) times daily with a meal.  . methocarbamol (ROBAXIN) 500 MG tablet Take 1 tablet (500 mg total) by mouth every 6 (six) hours as  needed for muscle spasms.  [name] nystatin (NYSTOP) 100000 UNIT/GM POWD Apply 100,000 g topically daily as needed (for rash-irritation). Rash  . nystatin cream (MYCOSTATIN) Apply 1 application topically 3 (three) times daily.   . potassium chloride SA (K-DUR,KLOR-CON) 20 MEQ tablet Take 20 mEq by mouth 2 (two) times daily.   . pravastatin (PRAVACHOL) 20 MG tablet Take 20 mg by mouth daily.  [name] tolterodine (DETROL LA) 4 MG 24 hr capsule Take 4 mg by mouth 2 (two) times a day.  . traZODone (DESYREL) 100 MG tablet Take 100-200 mg by mouth at bedtime.     Allergies:   Doxycycline, Adhesive [tape], Biaxin [clarithromycin], Percocet [oxycodone-acetaminophen], Xarelto [rivaroxaban], Clarithromycin, Clindamycin/lincomycin, Neosporin [neomycin-polymyxin b gu], and Penicillins   Social History   Tobacco Use  . Smoking status: Never Smoker  . Smokeless tobacco: Never Used  Substance Use Topics  . Alcohol use: No    Alcohol/week: 0.0 standard drinks  . Drug use: No     Family Hx: The patient's family history includes Arthritis in an other family member; Asthma in an other family member; Cancer in an other family member; Diabetes in an other family member; Heart disease in an other family member; Kidney disease in an other family member.  ROS:   Please see the history of present illness.     All other systems reviewed and are negative.   Prior CV studies:   The following studies were reviewed today:  Echocardiogram: 10/2018 IMPRESSIONS   1. The left ventricle has normal systolic function with an ejection fraction of 60-65%. The cavity size was normal. There is moderately increased left ventricular wall thickness. Left ventricular diastolic Doppler parameters are consistent with  pseudonormalization.  2. The right ventricle has normal systolic function. The cavity was normal. There is no increase in right ventricular wall thickness.  3. Pt appears to have paroxysmal atrial fib with episodes of  NSR.  4. The tricuspid valve is grossly normal.  5. The ascending aorta and aortic root are normal in size and structure.  6. The interatrial septum was not assessed.   TEE: 12/2018 IMPRESSIONS   1. The left ventricle has normal systolic function, with an ejection fraction of 55-60%. The cavity size was normal.  2. Left atrial size was LA is enlarged.  3. Normal LA appendage without thrombus, normal emptying velocities.  4. No evidence of mitral valve stenosis.  5. The aortic valve is tricuspid No stenosis  of the aortic valve.  6. The aortic root is normal in size and structure.  7. The interatrial septum appears to be lipomatous.  8. No evidence of vegetations  Labs/Other Tests and Data Reviewed:    EKG:  No ECG reviewed.  Recent Labs: 11/06/2018: B Natriuretic Peptide 106.0 12/24/2018: ALT 19; TSH 2.298 12/26/2018: BUN 12; Creatinine, Ser 0.66; Hemoglobin 10.0; Magnesium 1.7; Platelets 109; Potassium 4.4; Sodium 133   Recent Lipid Panel No results found for: CHOL, TRIG, HDL, CHOLHDL, LDLCALC, LDLDIRECT  Wt Readings from Last 3 Encounters:  01/22/19 273 lb (123.8 kg)  12/26/18 287 lb 0.6 oz (130.2 kg)  12/04/18 291 lb (132 kg)     Objective:    Vital Signs:  Wt 273 lb (123.8 kg)   BMI 45.43 kg/m    General: Pleasant female appearing in NAD Psych: Normal affect. Neuro: Alert and oriented X 3. Moves all extremities spontaneously. HEENT: Normal in appearance.   Lungs:  Resp regular and unlabored while talking on the phone.   ASSESSMENT & PLAN:    1. Paroxysmal Atrial Fibrillation - she denies any recent palpitations. Maintained NSR during her recent admission. Continue Cardizem CD 367m daily for rate-control. - she denies any evidence of active bleeding. Continue Eliquis 531mBID for anticoagulation. Remains on Mobic for her severe arthritis and reviewed the importance of monitoring for melena or hematochezia given the chronic use of NSAIDS in the setting of  anticoagulation.   2. HTN - she does not have a BP cuff at home but reports this was well-controlled at her recent PCP visit. Continue Cardizem CD 36019maily and Losartan 100m30mily.   3. HLD - followed by PCP. She remains on Pravastatin 20 mg daily.   4. MSSA Bacteremia - she has now completed her course of IV antibiotics. Her weakness and fatigue continue to improve. Has been referred to Outpatient PT by her PCP.    COVID-19 Education: The signs and symptoms of COVID-19 were discussed with the patient and how to seek care for testing (follow up with PCP or arrange E-visit).  The importance of social distancing was discussed today.  Time:   Today, I have spent 17 minutes with the patient with telehealth technology discussing the above problems.     Medication Adjustments/Labs and Tests Ordered: Current medicines are reviewed at length with the patient today.  Concerns regarding medicines are outlined above.   Tests Ordered: No orders of the defined types were placed in this encounter.   Medication Changes: No orders of the defined types were placed in this encounter.   Follow Up:  In Person in 6 month(s)  Signed, BritErma Heritage-C  01/22/2019 3:35 PM    [facility]

## 2019-01-22 NOTE — Patient Instructions (Signed)
Medication Instructions:  Your physician recommends that you continue on your current medications as directed. Please refer to the Current Medication list given to you today.   Labwork: NONE  Testing/Procedures: NONE  Follow-Up: Your physician wants you to follow-up in: 6 Months with Dr. Domenic Polite in [place]. You will receive a reminder letter in the mail two months in advance. If you don't receive a letter, please call our office to schedule the follow-up appointment.   Any Other Special Instructions Will Be Listed Below (If Applicable).     If you need a refill on your cardiac medications before your next appointment, please call your pharmacy.  Thank you for choosing [facility]!

## 2019-02-19 DIAGNOSIS — L309 Dermatitis, unspecified: Secondary | ICD-10-CM | POA: Diagnosis not present

## 2019-03-25 ENCOUNTER — Other Ambulatory Visit: Payer: Self-pay

## 2019-03-25 ENCOUNTER — Ambulatory Visit (HOSPITAL_COMMUNITY)
Admission: RE | Admit: 2019-03-25 | Discharge: 2019-03-25 | Disposition: A | Discharge status: Home / Self Care | Payer: PPO | Source: Ambulatory Visit | Attending: Internal Medicine | Admitting: Internal Medicine

## 2019-03-25 ENCOUNTER — Other Ambulatory Visit (HOSPITAL_COMMUNITY): Payer: Self-pay | Admitting: Internal Medicine

## 2019-03-25 DIAGNOSIS — M79604 Pain in right leg: Secondary | ICD-10-CM

## 2019-03-25 DIAGNOSIS — Z6841 Body Mass Index (BMI) 40.0 and over, adult: Secondary | ICD-10-CM | POA: Diagnosis not present

## 2019-03-25 DIAGNOSIS — I82401 Acute embolism and thrombosis of unspecified deep veins of right lower extremity: Secondary | ICD-10-CM | POA: Diagnosis not present

## 2019-03-25 DIAGNOSIS — R600 Localized edema: Secondary | ICD-10-CM | POA: Diagnosis not present

## 2019-03-25 DIAGNOSIS — R29898 Other symptoms and signs involving the musculoskeletal system: Secondary | ICD-10-CM | POA: Diagnosis not present

## 2019-03-25 DIAGNOSIS — E119 Type 2 diabetes mellitus without complications: Secondary | ICD-10-CM | POA: Diagnosis not present

## 2019-03-25 DIAGNOSIS — G4700 Insomnia, unspecified: Secondary | ICD-10-CM | POA: Diagnosis not present

## 2019-03-25 DIAGNOSIS — E538 Deficiency of other specified B group vitamins: Secondary | ICD-10-CM | POA: Diagnosis not present

## 2019-03-25 DIAGNOSIS — I10 Essential (primary) hypertension: Secondary | ICD-10-CM | POA: Diagnosis not present

## 2019-04-18 DIAGNOSIS — L209 Atopic dermatitis, unspecified: Secondary | ICD-10-CM | POA: Diagnosis not present

## 2019-04-18 DIAGNOSIS — Z681 Body mass index (BMI) 19 or less, adult: Secondary | ICD-10-CM | POA: Diagnosis not present

## 2019-04-18 DIAGNOSIS — E119 Type 2 diabetes mellitus without complications: Secondary | ICD-10-CM | POA: Diagnosis not present

## 2019-04-18 DIAGNOSIS — E669 Obesity, unspecified: Secondary | ICD-10-CM | POA: Diagnosis not present

## 2019-04-18 DIAGNOSIS — L739 Follicular disorder, unspecified: Secondary | ICD-10-CM | POA: Diagnosis not present

## 2019-04-18 DIAGNOSIS — Z23 Encounter for immunization: Secondary | ICD-10-CM | POA: Diagnosis not present

## 2019-04-18 DIAGNOSIS — E611 Iron deficiency: Secondary | ICD-10-CM | POA: Diagnosis not present

## 2019-05-05 ENCOUNTER — Emergency Department (HOSPITAL_COMMUNITY)
Admission: EM | Admit: 2019-05-05 | Discharge: 2019-05-05 | Disposition: A | Discharge status: Home / Self Care | Payer: PPO | Attending: Emergency Medicine | Admitting: Emergency Medicine

## 2019-05-05 ENCOUNTER — Other Ambulatory Visit: Payer: Self-pay

## 2019-05-05 ENCOUNTER — Encounter (HOSPITAL_COMMUNITY): Payer: Self-pay | Admitting: *Deleted

## 2019-05-05 DIAGNOSIS — R609 Edema, unspecified: Secondary | ICD-10-CM

## 2019-05-05 DIAGNOSIS — R1904 Left lower quadrant abdominal swelling, mass and lump: Secondary | ICD-10-CM | POA: Diagnosis not present

## 2019-05-05 DIAGNOSIS — J45909 Unspecified asthma, uncomplicated: Secondary | ICD-10-CM | POA: Insufficient documentation

## 2019-05-05 DIAGNOSIS — E039 Hypothyroidism, unspecified: Secondary | ICD-10-CM | POA: Diagnosis not present

## 2019-05-05 DIAGNOSIS — Z7984 Long term (current) use of oral hypoglycemic drugs: Secondary | ICD-10-CM | POA: Diagnosis not present

## 2019-05-05 DIAGNOSIS — E119 Type 2 diabetes mellitus without complications: Secondary | ICD-10-CM | POA: Insufficient documentation

## 2019-05-05 DIAGNOSIS — I2510 Atherosclerotic heart disease of native coronary artery without angina pectoris: Secondary | ICD-10-CM | POA: Insufficient documentation

## 2019-05-05 DIAGNOSIS — Z85850 Personal history of malignant neoplasm of thyroid: Secondary | ICD-10-CM | POA: Diagnosis not present

## 2019-05-05 DIAGNOSIS — J449 Chronic obstructive pulmonary disease, unspecified: Secondary | ICD-10-CM | POA: Insufficient documentation

## 2019-05-05 DIAGNOSIS — Z79899 Other long term (current) drug therapy: Secondary | ICD-10-CM | POA: Diagnosis not present

## 2019-05-05 DIAGNOSIS — Z7901 Long term (current) use of anticoagulants: Secondary | ICD-10-CM | POA: Insufficient documentation

## 2019-05-05 DIAGNOSIS — R600 Localized edema: Secondary | ICD-10-CM | POA: Diagnosis not present

## 2019-05-05 NOTE — Discharge Instructions (Signed)
You were seen in the emergency department today due to concern for a mass in your lower abdomen.  We reviewed your CT scan from July of this year which showed some edema meaning fluid/swelling in the abdomen and also showed that you have an abdominal wall hernia.  We suspect that each of these things are likely contributing to your symptoms.  We would like you to follow-up with your primary care provider as well as your general surgeon within the next 3 to 5 days for reevaluation of this.  Return to the ER for new or worsening symptoms including but limited to pain to the area, fever, inability to keep fluids down, inability to pass gas, not having a bowel movement over 24 hours, or any other concerns.

## 2019-05-05 NOTE — ED Triage Notes (Signed)
Pt with a mass to abdomen that shifts per pt.  Pt denies any pain, states that mass is aggravating.  Pt has seen Dr. Gerarda Fraction about it.

## 2019-05-05 NOTE — ED Provider Notes (Signed)
[facility] EMERGENCY DEPARTMENT Provider Note   CSN: [id number] Arrival date & time: 05/05/19  1602     History   Chief Complaint Chief Complaint  Patient presents with   Mass    HPI [name] [name] is a 63 y.o. female with a hx of COPD, CAD, anemia, liver cirrhosis, obesity, & T2DM who presents to the ED @ request of her granddaughter for evaluation of abdominal mass which has been present for the past 6 months. Patient states that she lost some weight and since then has noted a firm area to the lower abdomen that seems to shift from left to right and vice versa. She states the area is not painful. She states she has seen her PCP Dr. Gerarda Fraction for this and was recommended to see her general surgeon Dr. Ninfa Linden in [place]. No significant acute change today. Her granddaughter found out about it today and wanted her to come be evaluated as she has had a lot of loss of family/friends in the past few years and was concerned about her grand-daughter. Patient denies fever, color change, vomiting, nausea, diarrhea, constipation, or melena. Passing gas without difficulty.      HPI  Past Medical History:  Diagnosis Date   Anemia    Anxiety    Arthritis    Asthma    Atrial fibrillation (HCC)    Cirrhosis of liver (HCC)    COPD (chronic obstructive pulmonary disease) ([place])    Coronary artery disease    Depression    Edema    Essential hypertension, benign    GERD (gastroesophageal reflux disease)    Hypothyroidism    Morbid obesity (HCC)    Overactive bladder    Sleep apnea    CPAP - not consistent   Thyroid cancer ([place])    Type 2 diabetes mellitus ([place])     Patient Active Problem List   Diagnosis Date Noted   Fever 12/24/2018   Hyponatremia 12/24/2018   Abnormal liver function 12/24/2018   Cellulitis left lower extremity.  11/11/2018   Sepsis due to undetermined organism ([place]) 11/07/2018   Atrial fibrillation ([place]) 11/07/2018   COPD (chronic  obstructive pulmonary disease) ([place]) 11/07/2018   GERD (gastroesophageal reflux disease) 11/07/2018   Hypothyroidism 11/07/2018   Sleep apnea 11/07/2018   Hepatic cirrhosis ([place]) 09/01/2014   Cirrhosis, nonalcoholic ([place]) 09/62/8366   Incisional hernia 06/09/2014   S/P complete repair of rotator cuff 07/24/2012   Precordial pain 09/30/2010   Essential hypertension, benign 09/30/2010   Type 2 diabetes mellitus ([place]) 09/05/2008   OBESITY-MORBID (>100') 09/05/2008   ESOPHAGEAL REFLUX 09/05/2008   EDEMA 09/05/2008    Past Surgical History:  Procedure Laterality Date   "pump bumps"     bilateral heels--2000   ABDOMINAL HYSTERECTOMY  1985   CARDIAC CATHETERIZATION  2012   Dr. Lia Foyer told her nothing was wrong.   [place]   COLONOSCOPY WITH PROPOFOL N/A 04/17/2015   Procedure: COLONOSCOPY WITH PROPOFOL  at cecum at 0810; withdrawal time =15 minutes;  Surgeon: [name], MD;  Location: [facility];  Service: Endoscopy;  Laterality: N/A;   Debridement of abdominal wound  2011   ESOPHAGEAL DILATION N/A 04/17/2015   Procedure: ESOPHAGEAL DILATION WITH 56FR MALONEY DILATOR;  Surgeon: [name], MD;  Location: [facility];  Service: Endoscopy;  Laterality: N/A;   ESOPHAGOGASTRODUODENOSCOPY (EGD) WITH PROPOFOL N/A 04/17/2015   Procedure: ESOPHAGOGASTRODUODENOSCOPY (EGD) WITH PROPOFOL;  Surgeon: [name], MD;  Location: [facility];  Service: Endoscopy;  Laterality: N/A;   EXPLORATORY LAPAROTOMY  2003   FOOT SURGERY Bilateral 2000   Bone spur removed   FOOT SURGERY     INCISIONAL HERNIA REPAIR N/A 06/09/2014   Procedure: RECURRENT  INCISIONAL HERNIORRHAPHY WITH MESH;  Surgeon: [name], MD;  Location: [facility];  Service: General;  Laterality: N/A;   [place] N/A 10/15/2014   Procedure: [place];  Surgeon: [name], MD;  Location: [place];  Service: General;  Laterality:  N/A;   INSERTION OF MESH N/A 06/09/2014   Procedure: INSERTION OF MESH;  Surgeon: [name], MD;  Location: [facility];  Service: General;  Laterality: N/A;   INSERTION OF MESH N/A 10/15/2014   Procedure: INSERTION OF MESH;  Surgeon: [name], MD;  Location: [place];  Service: General;  Laterality: N/A;   POLYPECTOMY  04/17/2015   Procedure: POLYPECTOMY;  Surgeon: [name], MD;  Location: [facility];  Service: Endoscopy;;   RESECTION DISTAL CLAVICAL  05/07/2012   Procedure: RESECTION DISTAL CLAVICAL;  Surgeon: [name], MD;  Location: [facility];  Service: Orthopedics;  Laterality: Right;   SHOULDER OPEN ROTATOR CUFF REPAIR  05/07/2012   Procedure: ROTATOR CUFF REPAIR SHOULDER OPEN;  Surgeon: [name], MD;  Location: [facility];  Service: Orthopedics;  Laterality: Right;   TEE WITHOUT CARDIOVERSION N/A 12/26/2018   Procedure: TRANSESOPHAGEAL ECHOCARDIOGRAM (TEE) WITH PROPOFOL;  Surgeon: Arnoldo Lenis, MD;  [facility];  Service: Endoscopy;  Laterality: N/A;   THYROIDECTOMY  2009   TONSILLECTOMY  1324   UMBILICAL HERNIA REPAIR  2010     OB History   No obstetric history on file.      Home Medications    Prior to Admission medications   Medication Sig Start Date End Date Taking? Authorizing Provider  albuterol (PROVENTIL HFA) 108 (90 BASE) MCG/ACT inhaler Inhale 2 puffs into the lungs every 6 (six) hours as needed for wheezing or shortness of breath. Shortness of breath    [provider]  apixaban (ELIQUIS) 5 MG TABS tablet Take 1 tablet (5 mg total) by mouth 2 (two) times daily. 11/19/18   Satira Sark, MD  colesevelam [facility]) 625 MG tablet Take 1,875 mg by mouth as directed. 3 tabs am 3 tabs pm    [provider]  diltiazem (CARDIZEM CD) 360 MG 24 hr capsule Take 1 capsule (360 mg total) by mouth daily. 03/27/18   Satira Sark, MD  DULoxetine (CYMBALTA) 60 MG capsule Take 120 mg by mouth daily.  03/20/15    [provider]  econazole nitrate 1 % cream Apply topically daily. Patient taking differently: Apply 1 application topically daily as needed (for rash irritation).  12/09/15   Tuchman, Richard C, DPM  furosemide (LASIX) 20 MG tablet Take 20 mg by mouth daily as needed for fluid.     [provider]  gabapentin (NEURONTIN) 300 MG capsule Take 300 mg by mouth daily. 10/19/18   [provider]  HYDROcodone-acetaminophen (NORCO/VICODIN) 5-325 MG tablet Take 1 tablet by mouth every 4 (four) hours as needed. Patient taking differently: Take 1 tablet by mouth every 4 (four) hours as needed for moderate pain or severe pain.  12/04/18   Lily Kocher, PA-C  hydrOXYzine (ATARAX/VISTARIL) 25 MG tablet Take 25 mg by mouth 4 (four) times daily as needed for itching.  10/12/18   [provider]  LANTUS SOLOSTAR 100 UNIT/ML  Solostar Pen Inject 33 Units into the skin at bedtime.  09/21/18   [provider]  levothyroxine (SYNTHROID) 200 MCG tablet Take 200 mcg by mouth daily before breakfast. Take in addition to 75 mcg for a total of 275 mcg daily    [provider]  levothyroxine (SYNTHROID) 75 MCG tablet Take 50-75 mcg by mouth daily before breakfast. DOSE NEEDS TO BE VERIFIED!    [provider]  losartan (COZAAR) 100 MG tablet Take 100 mg by mouth daily.  04/27/18   [provider]  meloxicam (MOBIC) 15 MG tablet Take 15 mg by mouth daily.  10/22/15   [provider]  metFORMIN (GLUCOPHAGE) 1000 MG tablet Take 1,000 mg by mouth 2 (two) times daily with a meal.    [provider]  methocarbamol (ROBAXIN) 500 MG tablet Take 1 tablet (500 mg total) by mouth every 6 (six) hours as needed for muscle spasms. 12/26/18   Manuella Ghazi, Pratik D, DO  nystatin (NYSTOP) 100000 UNIT/GM POWD Apply 100,000 g topically daily as needed (for rash-irritation). Rash 08/18/10   [provider]  nystatin cream (MYCOSTATIN) Apply 1 application topically 3  (three) times daily.  03/23/17   [provider]  potassium chloride SA (K-DUR,KLOR-CON) 20 MEQ tablet Take 20 mEq by mouth 2 (two) times daily.  04/27/18   [provider]  pravastatin (PRAVACHOL) 20 MG tablet Take 20 mg by mouth daily. 10/30/18   [provider]  tolterodine (DETROL LA) 4 MG 24 hr capsule Take 4 mg by mouth 2 (two) times a day. 08/17/18   [provider]  traZODone (DESYREL) 100 MG tablet Take 100-200 mg by mouth at bedtime. 09/21/18   [provider]    Family History Family History  Problem Relation Age of Onset   Heart disease Other    Arthritis Other    Cancer Other    Asthma Other    Diabetes Other    Kidney disease Other     Social History Social History   Tobacco Use   Smoking status: Never Smoker   Smokeless tobacco: Never Used  Substance Use Topics   Alcohol use: No    Alcohol/week: 0.0 standard drinks   Drug use: No     Allergies   Doxycycline, Adhesive [tape], Biaxin [clarithromycin], Percocet [oxycodone-acetaminophen], Xarelto [rivaroxaban], Clarithromycin, Clindamycin/lincomycin, Neosporin [neomycin-polymyxin b gu], and Penicillins   Review of Systems Review of Systems  Constitutional: Negative for chills and fever.  Respiratory: Negative for shortness of breath.   Cardiovascular: Negative for chest pain and leg swelling.  Gastrointestinal: Negative for abdominal pain, blood in stool, constipation, diarrhea, nausea and vomiting.       + for abdominal mass  Genitourinary: Negative for dysuria.     Physical Exam Updated Vital Signs BP (!) 147/112 (BP Location: Right Arm)    Pulse 95    Temp 97.9 F (36.6 C) (Oral)    Ht 5' 5"  (1.651 m)    Wt 133.8 kg    SpO2 100%    BMI 49.09 kg/m   Physical Exam Vitals signs and nursing note reviewed.  Constitutional:      General: She is not in acute distress.    Appearance: She is well-developed. She is obese. She is not toxic-appearing.  HENT:       Head: Normocephalic and atraumatic.  Eyes:     General:        Right eye: No discharge.        Left  eye: No discharge.     Conjunctiva/sclera: Conjunctivae normal.  Neck:     Musculoskeletal: Neck supple.  Cardiovascular:     Rate and Rhythm: Normal rate and regular rhythm.  Pulmonary:     Effort: Pulmonary effort is normal. No respiratory distress.     Breath sounds: Normal breath sounds. No wheezing, rhonchi or rales.  Abdominal:     General: There is no distension.     Palpations: Abdomen is soft.     Tenderness: There is no abdominal tenderness. There is no right CVA tenderness, left CVA tenderness, guarding or rebound.     Comments: Patient has large abdominal pannus.  She has some chronic appearing skin changes with scabbing- no significant erythema/warmth or skin breakdown. There is some edema noted to the lower abdomen with firmness to palpation. Nontender to palpation.   Musculoskeletal:     Right lower leg: No edema.     Left lower leg: No edema.     Comments: Chronic appearing skin changes to the lower extremities bilaterally.   Skin:    General: Skin is warm and dry.     Findings: No rash.  Neurological:     Mental Status: She is alert.     Comments: Clear speech.   Psychiatric:        Behavior: Behavior normal.      ED Treatments / Results  Labs (all labs ordered are listed, but only abnormal results are displayed) Labs Reviewed - No data to display  EKG None  Radiology No results found.  Procedures Procedures (including critical care time)  Medications Ordered in ED Medications - No data to display   Initial Impression / Assessment and Plan / ED Course  I have reviewed the triage vital signs and the nursing notes.  Pertinent labs & imaging results that were available during my care of the patient were reviewed by me and considered in my medical decision making (see chart for details).   Patient presents to the ED with concern for abdominal  mass.  She is nontoxic appearing, vitals WNL with the exception of elevated BP- doubt HTN emergency. Exam w/ large abdominal pannus with lower abdominal edema and some firmness to palpation.  CT A/P from 12/24/18 reviewed:  IMPRESSION: 1. Lower anterior abdominal pannus edema with skin thickening inferiorly. This may be due to edema or cellulitis. No abscess or soft tissue air. 2. No other acute findings or explanation for fever in the chest, abdomen, or pelvis. 3. Mild hepatosplenomegaly. Nodular hepatic contours suggest cirrhosis. 4. Mild bilateral inguinal and external iliac adenopathy, likely reactive. 5. Lower abdominal wall hernia containing nonobstructed noninflamed small bowel. Large colonic stool burden with colonic redundancy, suggesting constipation.  CT performed within past 6 months. Suspect that her symptoms are likely due to lower abdominal wall hernia & lower abdominal pannus edema noted on CT imaging as edema is noted on exam. She is not having H&P components to indicate obstructive process currently. Discussed with supervising physician Dr. Roderic Palau who has evaluated patient & recommends discharge home with PCP follow up which I am in agreement with. I discussed plan, need for follow-up, and return precautions with the patient. Provided opportunity for questions, patient confirmed understanding and is in agreement with plan.   This is a shared visit with supervising physician Dr. Roderic Palau who has independently evaluated patient & provided guidance in evaluation/management/disposition, in agreement with care   Final Clinical Impressions(s) / ED Diagnoses   Final diagnoses:  Abdominal edema on  examination    ED Discharge Orders    None       Leafy Kindle 05/05/19 1740    Milton Ferguson, MD 05/05/19 [phone]

## 2019-05-05 NOTE — ED Notes (Signed)
Pt has been seen by her physician for the same complaint within the last month  She reports where she has lost a great deal of weight, her pannus has a mass that shifts from left to right to middle depending upon her position Her physician suggested she contact a plastic surgeon  She comes today because her granddaughter insisted that she come as she has redness to her pannus  Pt is currently on Doxycycline

## 2019-05-08 DIAGNOSIS — R2689 Other abnormalities of gait and mobility: Secondary | ICD-10-CM | POA: Diagnosis not present

## 2019-05-08 DIAGNOSIS — E119 Type 2 diabetes mellitus without complications: Secondary | ICD-10-CM | POA: Diagnosis not present

## 2019-05-08 DIAGNOSIS — Z6841 Body Mass Index (BMI) 40.0 and over, adult: Secondary | ICD-10-CM | POA: Diagnosis not present

## 2019-05-08 DIAGNOSIS — M793 Panniculitis, unspecified: Secondary | ICD-10-CM | POA: Diagnosis not present

## 2019-05-18 ENCOUNTER — Other Ambulatory Visit: Payer: Self-pay | Admitting: Cardiology

## 2019-05-21 ENCOUNTER — Emergency Department (HOSPITAL_COMMUNITY): Payer: PPO

## 2019-05-21 ENCOUNTER — Encounter (HOSPITAL_COMMUNITY): Payer: Self-pay | Admitting: Emergency Medicine

## 2019-05-21 ENCOUNTER — Emergency Department (HOSPITAL_COMMUNITY)
Admission: EM | Admit: 2019-05-21 | Discharge: 2019-05-21 | Disposition: A | Discharge status: Home / Self Care | Payer: PPO | Attending: Emergency Medicine | Admitting: Emergency Medicine

## 2019-05-21 ENCOUNTER — Other Ambulatory Visit: Payer: Self-pay

## 2019-05-21 DIAGNOSIS — S0990XA Unspecified injury of head, initial encounter: Secondary | ICD-10-CM | POA: Insufficient documentation

## 2019-05-21 DIAGNOSIS — S0031XA Abrasion of nose, initial encounter: Secondary | ICD-10-CM | POA: Diagnosis not present

## 2019-05-21 DIAGNOSIS — Y9389 Activity, other specified: Secondary | ICD-10-CM | POA: Insufficient documentation

## 2019-05-21 DIAGNOSIS — S0083XA Contusion of other part of head, initial encounter: Secondary | ICD-10-CM | POA: Diagnosis not present

## 2019-05-21 DIAGNOSIS — Z79899 Other long term (current) drug therapy: Secondary | ICD-10-CM | POA: Diagnosis not present

## 2019-05-21 DIAGNOSIS — S299XXA Unspecified injury of thorax, initial encounter: Secondary | ICD-10-CM | POA: Diagnosis not present

## 2019-05-21 DIAGNOSIS — Z794 Long term (current) use of insulin: Secondary | ICD-10-CM | POA: Diagnosis not present

## 2019-05-21 DIAGNOSIS — Y92410 Unspecified street and highway as the place of occurrence of the external cause: Secondary | ICD-10-CM | POA: Insufficient documentation

## 2019-05-21 DIAGNOSIS — I10 Essential (primary) hypertension: Secondary | ICD-10-CM | POA: Diagnosis not present

## 2019-05-21 DIAGNOSIS — E119 Type 2 diabetes mellitus without complications: Secondary | ICD-10-CM | POA: Insufficient documentation

## 2019-05-21 DIAGNOSIS — I2510 Atherosclerotic heart disease of native coronary artery without angina pectoris: Secondary | ICD-10-CM | POA: Diagnosis not present

## 2019-05-21 DIAGNOSIS — Z7901 Long term (current) use of anticoagulants: Secondary | ICD-10-CM | POA: Diagnosis not present

## 2019-05-21 DIAGNOSIS — E039 Hypothyroidism, unspecified: Secondary | ICD-10-CM | POA: Insufficient documentation

## 2019-05-21 DIAGNOSIS — R0902 Hypoxemia: Secondary | ICD-10-CM | POA: Diagnosis not present

## 2019-05-21 DIAGNOSIS — R0781 Pleurodynia: Secondary | ICD-10-CM

## 2019-05-21 DIAGNOSIS — Y999 Unspecified external cause status: Secondary | ICD-10-CM | POA: Diagnosis not present

## 2019-05-21 DIAGNOSIS — M7918 Myalgia, other site: Secondary | ICD-10-CM

## 2019-05-21 DIAGNOSIS — R52 Pain, unspecified: Secondary | ICD-10-CM | POA: Diagnosis not present

## 2019-05-21 DIAGNOSIS — B359 Dermatophytosis, unspecified: Secondary | ICD-10-CM | POA: Diagnosis not present

## 2019-05-21 DIAGNOSIS — Z85850 Personal history of malignant neoplasm of thyroid: Secondary | ICD-10-CM | POA: Insufficient documentation

## 2019-05-21 DIAGNOSIS — S0993XA Unspecified injury of face, initial encounter: Secondary | ICD-10-CM | POA: Diagnosis not present

## 2019-05-21 MED ORDER — ACETAMINOPHEN 325 MG PO TABS
650.0000 mg | ORAL_TABLET | Freq: Once | ORAL | Status: AC
  Administered 2019-05-21: 650 mg via ORAL
  Filled 2019-05-21: qty 2
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
## 2019-05-21 NOTE — ED Notes (Signed)
Patient transported to CT 

## 2019-05-21 NOTE — Discharge Instructions (Addendum)
You may take 679m of tylenol every 6 hours for your rib pain.   Please follow up with your primary care provider within 5-7 days for re-evaluation of your symptoms. If you do not have a primary care provider, information for a healthcare clinic has been provided for you to make arrangements for follow up care. Please return to the emergency department for any new or worsening symptoms.
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
## 2019-05-23 DIAGNOSIS — S0292XA Unspecified fracture of facial bones, initial encounter for closed fracture: Secondary | ICD-10-CM | POA: Diagnosis not present

## 2019-05-29 ENCOUNTER — Encounter (HOSPITAL_COMMUNITY): Payer: Self-pay | Admitting: Emergency Medicine

## 2019-05-29 ENCOUNTER — Emergency Department (HOSPITAL_COMMUNITY): Payer: PPO

## 2019-05-29 ENCOUNTER — Other Ambulatory Visit: Payer: Self-pay

## 2019-05-29 ENCOUNTER — Emergency Department (HOSPITAL_COMMUNITY)
Admission: EM | Admit: 2019-05-29 | Discharge: 2019-05-29 | Disposition: A | Discharge status: Home / Self Care | Payer: PPO | Source: Home / Self Care | Attending: Emergency Medicine | Admitting: Emergency Medicine

## 2019-05-29 DIAGNOSIS — E119 Type 2 diabetes mellitus without complications: Secondary | ICD-10-CM | POA: Insufficient documentation

## 2019-05-29 DIAGNOSIS — Z7901 Long term (current) use of anticoagulants: Secondary | ICD-10-CM | POA: Insufficient documentation

## 2019-05-29 DIAGNOSIS — Z743 Need for continuous supervision: Secondary | ICD-10-CM | POA: Diagnosis not present

## 2019-05-29 DIAGNOSIS — M545 Low back pain: Secondary | ICD-10-CM | POA: Diagnosis not present

## 2019-05-29 DIAGNOSIS — L89152 Pressure ulcer of sacral region, stage 2: Secondary | ICD-10-CM | POA: Diagnosis not present

## 2019-05-29 DIAGNOSIS — I10 Essential (primary) hypertension: Secondary | ICD-10-CM | POA: Insufficient documentation

## 2019-05-29 DIAGNOSIS — L89302 Pressure ulcer of unspecified buttock, stage 2: Secondary | ICD-10-CM | POA: Diagnosis not present

## 2019-05-29 DIAGNOSIS — R52 Pain, unspecified: Secondary | ICD-10-CM | POA: Diagnosis not present

## 2019-05-29 DIAGNOSIS — Z03818 Encounter for observation for suspected exposure to other biological agents ruled out: Secondary | ICD-10-CM | POA: Diagnosis not present

## 2019-05-29 DIAGNOSIS — Z9049 Acquired absence of other specified parts of digestive tract: Secondary | ICD-10-CM | POA: Diagnosis not present

## 2019-05-29 DIAGNOSIS — L279 Dermatitis due to unspecified substance taken internally: Secondary | ICD-10-CM | POA: Diagnosis not present

## 2019-05-29 DIAGNOSIS — Z794 Long term (current) use of insulin: Secondary | ICD-10-CM | POA: Diagnosis not present

## 2019-05-29 DIAGNOSIS — R1084 Generalized abdominal pain: Secondary | ICD-10-CM | POA: Diagnosis not present

## 2019-05-29 DIAGNOSIS — I728 Aneurysm of other specified arteries: Secondary | ICD-10-CM | POA: Diagnosis not present

## 2019-05-29 DIAGNOSIS — N3281 Overactive bladder: Secondary | ICD-10-CM | POA: Diagnosis not present

## 2019-05-29 DIAGNOSIS — J449 Chronic obstructive pulmonary disease, unspecified: Secondary | ICD-10-CM | POA: Insufficient documentation

## 2019-05-29 DIAGNOSIS — R410 Disorientation, unspecified: Secondary | ICD-10-CM | POA: Diagnosis not present

## 2019-05-29 DIAGNOSIS — E860 Dehydration: Secondary | ICD-10-CM | POA: Diagnosis not present

## 2019-05-29 DIAGNOSIS — Z20828 Contact with and (suspected) exposure to other viral communicable diseases: Secondary | ICD-10-CM | POA: Diagnosis not present

## 2019-05-29 DIAGNOSIS — R1031 Right lower quadrant pain: Secondary | ICD-10-CM

## 2019-05-29 DIAGNOSIS — Z85850 Personal history of malignant neoplasm of thyroid: Secondary | ICD-10-CM | POA: Insufficient documentation

## 2019-05-29 DIAGNOSIS — I2510 Atherosclerotic heart disease of native coronary artery without angina pectoris: Secondary | ICD-10-CM | POA: Insufficient documentation

## 2019-05-29 DIAGNOSIS — I482 Chronic atrial fibrillation, unspecified: Secondary | ICD-10-CM | POA: Diagnosis not present

## 2019-05-29 DIAGNOSIS — E039 Hypothyroidism, unspecified: Secondary | ICD-10-CM | POA: Insufficient documentation

## 2019-05-29 DIAGNOSIS — L304 Erythema intertrigo: Secondary | ICD-10-CM | POA: Insufficient documentation

## 2019-05-29 DIAGNOSIS — Z90710 Acquired absence of both cervix and uterus: Secondary | ICD-10-CM | POA: Diagnosis not present

## 2019-05-29 DIAGNOSIS — R0902 Hypoxemia: Secondary | ICD-10-CM | POA: Diagnosis not present

## 2019-05-29 DIAGNOSIS — K7581 Nonalcoholic steatohepatitis (NASH): Secondary | ICD-10-CM | POA: Diagnosis not present

## 2019-05-29 DIAGNOSIS — N179 Acute kidney failure, unspecified: Secondary | ICD-10-CM | POA: Diagnosis not present

## 2019-05-29 DIAGNOSIS — E871 Hypo-osmolality and hyponatremia: Secondary | ICD-10-CM | POA: Diagnosis not present

## 2019-05-29 DIAGNOSIS — L03311 Cellulitis of abdominal wall: Secondary | ICD-10-CM | POA: Diagnosis not present

## 2019-05-29 DIAGNOSIS — S0083XD Contusion of other part of head, subsequent encounter: Secondary | ICD-10-CM | POA: Diagnosis not present

## 2019-05-29 DIAGNOSIS — B379 Candidiasis, unspecified: Secondary | ICD-10-CM | POA: Diagnosis not present

## 2019-05-29 DIAGNOSIS — I4891 Unspecified atrial fibrillation: Secondary | ICD-10-CM | POA: Diagnosis not present

## 2019-05-29 DIAGNOSIS — Z6841 Body Mass Index (BMI) 40.0 and over, adult: Secondary | ICD-10-CM | POA: Diagnosis not present

## 2019-05-29 DIAGNOSIS — I509 Heart failure, unspecified: Secondary | ICD-10-CM | POA: Diagnosis not present

## 2019-05-29 DIAGNOSIS — R109 Unspecified abdominal pain: Secondary | ICD-10-CM | POA: Diagnosis not present

## 2019-05-29 DIAGNOSIS — B372 Candidiasis of skin and nail: Secondary | ICD-10-CM | POA: Diagnosis not present

## 2019-05-29 DIAGNOSIS — Z833 Family history of diabetes mellitus: Secondary | ICD-10-CM | POA: Diagnosis not present

## 2019-05-29 DIAGNOSIS — Z825 Family history of asthma and other chronic lower respiratory diseases: Secondary | ICD-10-CM | POA: Diagnosis not present

## 2019-05-29 DIAGNOSIS — R531 Weakness: Secondary | ICD-10-CM | POA: Diagnosis not present

## 2019-05-29 DIAGNOSIS — L03116 Cellulitis of left lower limb: Secondary | ICD-10-CM | POA: Diagnosis not present

## 2019-05-29 DIAGNOSIS — Z681 Body mass index (BMI) 19 or less, adult: Secondary | ICD-10-CM | POA: Diagnosis not present

## 2019-05-29 LAB — CBC WITH DIFFERENTIAL/PLATELET
Abs Immature Granulocytes: 0.03 10*3/uL (ref 0.00–0.07)
Basophils Absolute: 0 10*3/uL (ref 0.0–0.1)
Basophils Relative: 1 %
Eosinophils Absolute: 0.2 10*3/uL (ref 0.0–0.5)
Eosinophils Relative: 2 %
HCT: 31.3 % — ABNORMAL LOW (ref 36.0–46.0)
Hemoglobin: 9.5 g/dL — ABNORMAL LOW (ref 12.0–15.0)
Immature Granulocytes: 0 %
Lymphocytes Relative: 12 %
Lymphs Abs: 1 10*3/uL (ref 0.7–4.0)
MCH: 26.2 pg (ref 26.0–34.0)
MCHC: 30.4 g/dL (ref 30.0–36.0)
MCV: 86.5 fL (ref 80.0–100.0)
Monocytes Absolute: 0.7 10*3/uL (ref 0.1–1.0)
Monocytes Relative: 9 %
Neutro Abs: 6.4 10*3/uL (ref 1.7–7.7)
Neutrophils Relative %: 76 %
Platelets: 184 10*3/uL (ref 150–400)
RBC: 3.62 MIL/uL — ABNORMAL LOW (ref 3.87–5.11)
RDW: 14.9 % (ref 11.5–15.5)
WBC: 8.3 10*3/uL (ref 4.0–10.5)
nRBC: 0 % (ref 0.0–0.2)

## 2019-05-29 LAB — LACTIC ACID, PLASMA: Lactic Acid, Venous: 1.2 mmol/L (ref 0.5–1.9)

## 2019-05-29 LAB — CBG MONITORING, ED: Glucose-Capillary: 143 mg/dL — ABNORMAL HIGH (ref 70–99)

## 2019-05-29 LAB — COMPREHENSIVE METABOLIC PANEL
ALT: 15 U/L (ref 0–44)
AST: 22 U/L (ref 15–41)
Albumin: 3 g/dL — ABNORMAL LOW (ref 3.5–5.0)
Alkaline Phosphatase: 117 U/L (ref 38–126)
Anion gap: 10 (ref 5–15)
BUN: 14 mg/dL (ref 8–23)
CO2: 25 mmol/L (ref 22–32)
Calcium: 8.9 mg/dL (ref 8.9–10.3)
Chloride: 96 mmol/L — ABNORMAL LOW (ref 98–111)
Creatinine, Ser: 0.91 mg/dL (ref 0.44–1.00)
GFR calc Af Amer: >60 mL/min (ref >60)
GFR calc non Af Amer: >60 mL/min (ref >60)
Glucose, Bld: 176 mg/dL — ABNORMAL HIGH (ref 70–99)
Potassium: 4.1 mmol/L (ref 3.5–5.1)
Sodium: 131 mmol/L — ABNORMAL LOW (ref 135–145)
Total Bilirubin: 0.7 mg/dL (ref 0.3–1.2)
Total Protein: 7.1 g/dL (ref 6.5–8.1)

## 2019-05-29 LAB — PROTIME-INR
INR: 1.7 — ABNORMAL HIGH (ref 0.8–1.2)
Prothrombin Time: 19.6 seconds — ABNORMAL HIGH (ref 11.4–15.2)

## 2019-05-29 LAB — AMMONIA: Ammonia: <9 umol/L — ABNORMAL LOW (ref 9–35)

## 2019-05-29 LAB — LIPASE, BLOOD: Lipase: 15 U/L (ref 11–51)

## 2019-05-29 MED ORDER — NYSTATIN 100000 UNIT/GM EX CREA: TOPICAL_CREAM | CUTANEOUS | 0 refills | Status: DC

## 2019-05-29 MED ORDER — IOHEXOL 300 MG/ML  SOLN
100.0000 mL | Freq: Once | INTRAMUSCULAR | Status: AC | PRN
  Administered 2019-05-29: 100 mL via INTRAVENOUS

## 2019-05-29 NOTE — Discharge Instructions (Addendum)
Use topical antifungal either your nystatin powder or cream on moist skin folds Follow up with primary doctor later this week. Return for new or worsening symptoms such as fevers, persistent abdominal pain or vomiting, blood in your stools.

## 2019-05-29 NOTE — ED Notes (Signed)
ekg given to pollina

## 2019-05-29 NOTE — ED Notes (Signed)
Spoke with granddaughter regarding transportation. Granddaughter unable to transport pt but states trying to call other family to assist

## 2019-05-29 NOTE — ED Notes (Addendum)
Pt declines giving a urine sample. States "I only pee twice a day."
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
## 2019-05-30 ENCOUNTER — Emergency Department (HOSPITAL_COMMUNITY): Payer: PPO

## 2019-05-30 ENCOUNTER — Encounter (HOSPITAL_COMMUNITY): Payer: Self-pay | Admitting: *Deleted

## 2019-05-30 ENCOUNTER — Inpatient Hospital Stay (HOSPITAL_COMMUNITY)
Admission: EM | Admit: 2019-05-30 | Discharge: 2019-06-02 | DRG: 607 | Disposition: A | Discharge status: Home Health | Payer: PPO | Attending: Family Medicine | Admitting: Family Medicine

## 2019-05-30 ENCOUNTER — Other Ambulatory Visit: Payer: Self-pay

## 2019-05-30 DIAGNOSIS — L8990 Pressure ulcer of unspecified site, unspecified stage: Secondary | ICD-10-CM | POA: Diagnosis present

## 2019-05-30 DIAGNOSIS — J449 Chronic obstructive pulmonary disease, unspecified: Secondary | ICD-10-CM | POA: Diagnosis present

## 2019-05-30 DIAGNOSIS — L89152 Pressure ulcer of sacral region, stage 2: Secondary | ICD-10-CM | POA: Diagnosis present

## 2019-05-30 DIAGNOSIS — B372 Candidiasis of skin and nail: Principal | ICD-10-CM | POA: Diagnosis present

## 2019-05-30 DIAGNOSIS — N3281 Overactive bladder: Secondary | ICD-10-CM | POA: Diagnosis present

## 2019-05-30 DIAGNOSIS — Z9049 Acquired absence of other specified parts of digestive tract: Secondary | ICD-10-CM

## 2019-05-30 DIAGNOSIS — I10 Essential (primary) hypertension: Secondary | ICD-10-CM | POA: Diagnosis present

## 2019-05-30 DIAGNOSIS — E669 Obesity, unspecified: Secondary | ICD-10-CM | POA: Diagnosis present

## 2019-05-30 DIAGNOSIS — E871 Hypo-osmolality and hyponatremia: Secondary | ICD-10-CM | POA: Diagnosis present

## 2019-05-30 DIAGNOSIS — Z794 Long term (current) use of insulin: Secondary | ICD-10-CM

## 2019-05-30 DIAGNOSIS — B379 Candidiasis, unspecified: Secondary | ICD-10-CM | POA: Diagnosis present

## 2019-05-30 DIAGNOSIS — E039 Hypothyroidism, unspecified: Secondary | ICD-10-CM | POA: Diagnosis present

## 2019-05-30 DIAGNOSIS — E1165 Type 2 diabetes mellitus with hyperglycemia: Secondary | ICD-10-CM | POA: Diagnosis present

## 2019-05-30 DIAGNOSIS — L03311 Cellulitis of abdominal wall: Secondary | ICD-10-CM

## 2019-05-30 DIAGNOSIS — Z20828 Contact with and (suspected) exposure to other viral communicable diseases: Secondary | ICD-10-CM | POA: Diagnosis present

## 2019-05-30 DIAGNOSIS — Z90710 Acquired absence of both cervix and uterus: Secondary | ICD-10-CM

## 2019-05-30 DIAGNOSIS — Z85850 Personal history of malignant neoplasm of thyroid: Secondary | ICD-10-CM

## 2019-05-30 DIAGNOSIS — I2510 Atherosclerotic heart disease of native coronary artery without angina pectoris: Secondary | ICD-10-CM | POA: Diagnosis present

## 2019-05-30 DIAGNOSIS — Z6841 Body Mass Index (BMI) 40.0 and over, adult: Secondary | ICD-10-CM

## 2019-05-30 DIAGNOSIS — N179 Acute kidney failure, unspecified: Secondary | ICD-10-CM | POA: Diagnosis present

## 2019-05-30 DIAGNOSIS — E860 Dehydration: Secondary | ICD-10-CM | POA: Diagnosis present

## 2019-05-30 DIAGNOSIS — L304 Erythema intertrigo: Secondary | ICD-10-CM | POA: Diagnosis present

## 2019-05-30 DIAGNOSIS — Z833 Family history of diabetes mellitus: Secondary | ICD-10-CM

## 2019-05-30 DIAGNOSIS — D649 Anemia, unspecified: Secondary | ICD-10-CM | POA: Diagnosis present

## 2019-05-30 DIAGNOSIS — I482 Chronic atrial fibrillation, unspecified: Secondary | ICD-10-CM | POA: Diagnosis present

## 2019-05-30 DIAGNOSIS — R410 Disorientation, unspecified: Secondary | ICD-10-CM

## 2019-05-30 DIAGNOSIS — K7581 Nonalcoholic steatohepatitis (NASH): Secondary | ICD-10-CM | POA: Diagnosis present

## 2019-05-30 DIAGNOSIS — Z825 Family history of asthma and other chronic lower respiratory diseases: Secondary | ICD-10-CM

## 2019-05-30 DIAGNOSIS — L03116 Cellulitis of left lower limb: Secondary | ICD-10-CM | POA: Diagnosis present

## 2019-05-30 LAB — CBC WITH DIFFERENTIAL/PLATELET
Abs Immature Granulocytes: 0.03 10*3/uL (ref 0.00–0.07)
Basophils Absolute: 0 10*3/uL (ref 0.0–0.1)
Basophils Relative: 0 %
Eosinophils Absolute: 0.1 10*3/uL (ref 0.0–0.5)
Eosinophils Relative: 1 %
HCT: 29.4 % — ABNORMAL LOW (ref 36.0–46.0)
Hemoglobin: 8.8 g/dL — ABNORMAL LOW (ref 12.0–15.0)
Immature Granulocytes: 0 %
Lymphocytes Relative: 9 %
Lymphs Abs: 0.8 10*3/uL (ref 0.7–4.0)
MCH: 26 pg (ref 26.0–34.0)
MCHC: 29.9 g/dL — ABNORMAL LOW (ref 30.0–36.0)
MCV: 86.7 fL (ref 80.0–100.0)
Monocytes Absolute: 0.9 10*3/uL (ref 0.1–1.0)
Monocytes Relative: 10 %
Neutro Abs: 7.3 10*3/uL (ref 1.7–7.7)
Neutrophils Relative %: 80 %
Platelets: 192 10*3/uL (ref 150–400)
RBC: 3.39 MIL/uL — ABNORMAL LOW (ref 3.87–5.11)
RDW: 15.3 % (ref 11.5–15.5)
WBC: 9.2 10*3/uL (ref 4.0–10.5)
nRBC: 0 % (ref 0.0–0.2)

## 2019-05-30 LAB — COMPREHENSIVE METABOLIC PANEL
ALT: 17 U/L (ref 0–44)
AST: 27 U/L (ref 15–41)
Albumin: 2.9 g/dL — ABNORMAL LOW (ref 3.5–5.0)
Alkaline Phosphatase: 136 U/L — ABNORMAL HIGH (ref 38–126)
Anion gap: 7 (ref 5–15)
BUN: 24 mg/dL — ABNORMAL HIGH (ref 8–23)
CO2: 24 mmol/L (ref 22–32)
Calcium: 8.5 mg/dL — ABNORMAL LOW (ref 8.9–10.3)
Chloride: 98 mmol/L (ref 98–111)
Creatinine, Ser: 1.65 mg/dL — ABNORMAL HIGH (ref 0.44–1.00)
GFR calc Af Amer: 38 mL/min — ABNORMAL LOW (ref >60)
GFR calc non Af Amer: 32 mL/min — ABNORMAL LOW (ref >60)
Glucose, Bld: 169 mg/dL — ABNORMAL HIGH (ref 70–99)
Potassium: 3.8 mmol/L (ref 3.5–5.1)
Sodium: 129 mmol/L — ABNORMAL LOW (ref 135–145)
Total Bilirubin: 1 mg/dL (ref 0.3–1.2)
Total Protein: 7.1 g/dL (ref 6.5–8.1)

## 2019-05-30 LAB — TROPONIN I (HIGH SENSITIVITY)
Troponin I (High Sensitivity): 7 ng/L (ref <18)
Troponin I (High Sensitivity): 7 ng/L (ref <18)

## 2019-05-30 LAB — AMMONIA: Ammonia: 16 umol/L (ref 9–35)

## 2019-05-30 LAB — BRAIN NATRIURETIC PEPTIDE: B Natriuretic Peptide: 25 pg/mL (ref 0.0–100.0)

## 2019-05-30 LAB — CBG MONITORING, ED: Glucose-Capillary: 168 mg/dL — ABNORMAL HIGH (ref 70–99)

## 2019-05-30 MED ORDER — SODIUM CHLORIDE 0.9 % IV BOLUS
1000.0000 mL | Freq: Once | INTRAVENOUS | Status: AC
  Administered 2019-05-30: 21:00:00 1000 mL via INTRAVENOUS

## 2019-05-30 MED ORDER — CEFAZOLIN SODIUM-DEXTROSE 1-4 GM/50ML-% IV SOLN
1.0000 g | Freq: Once | INTRAVENOUS | Status: AC
  Administered 2019-05-31: 1 g via INTRAVENOUS
  Filled 2019-05-30 (×2): qty 50
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
## 2019-05-30 NOTE — ED Notes (Signed)
Patient transported to CT 

## 2019-05-30 NOTE — ED Notes (Addendum)
Pt returned from CT.xray

## 2019-05-30 NOTE — ED Notes (Signed)
Purwick placed

## 2019-05-30 NOTE — ED Triage Notes (Signed)
Patient arrived to ED via [facility] due to stated dehydration.Patient states she feels she is dehydrated and only drank one Dr. [name] today and has not ate anything.  Patient also feels she is confused.   Patient alert when spoken too, but falls asleep during triage process.

## 2019-05-31 ENCOUNTER — Encounter (HOSPITAL_COMMUNITY): Payer: Self-pay | Admitting: Family Medicine

## 2019-05-31 DIAGNOSIS — N179 Acute kidney failure, unspecified: Secondary | ICD-10-CM | POA: Diagnosis present

## 2019-05-31 DIAGNOSIS — Z794 Long term (current) use of insulin: Secondary | ICD-10-CM | POA: Diagnosis not present

## 2019-05-31 DIAGNOSIS — B379 Candidiasis, unspecified: Secondary | ICD-10-CM | POA: Diagnosis present

## 2019-05-31 DIAGNOSIS — B372 Candidiasis of skin and nail: Secondary | ICD-10-CM | POA: Diagnosis present

## 2019-05-31 DIAGNOSIS — J449 Chronic obstructive pulmonary disease, unspecified: Secondary | ICD-10-CM | POA: Diagnosis present

## 2019-05-31 DIAGNOSIS — I482 Chronic atrial fibrillation, unspecified: Secondary | ICD-10-CM | POA: Diagnosis present

## 2019-05-31 DIAGNOSIS — Z825 Family history of asthma and other chronic lower respiratory diseases: Secondary | ICD-10-CM | POA: Diagnosis not present

## 2019-05-31 DIAGNOSIS — I10 Essential (primary) hypertension: Secondary | ICD-10-CM | POA: Diagnosis present

## 2019-05-31 DIAGNOSIS — L03311 Cellulitis of abdominal wall: Secondary | ICD-10-CM

## 2019-05-31 DIAGNOSIS — L89152 Pressure ulcer of sacral region, stage 2: Secondary | ICD-10-CM | POA: Diagnosis present

## 2019-05-31 DIAGNOSIS — L89302 Pressure ulcer of unspecified buttock, stage 2: Secondary | ICD-10-CM | POA: Diagnosis not present

## 2019-05-31 DIAGNOSIS — I2510 Atherosclerotic heart disease of native coronary artery without angina pectoris: Secondary | ICD-10-CM | POA: Diagnosis present

## 2019-05-31 DIAGNOSIS — Z833 Family history of diabetes mellitus: Secondary | ICD-10-CM | POA: Diagnosis not present

## 2019-05-31 DIAGNOSIS — Z6841 Body Mass Index (BMI) 40.0 and over, adult: Secondary | ICD-10-CM | POA: Diagnosis not present

## 2019-05-31 DIAGNOSIS — E860 Dehydration: Secondary | ICD-10-CM | POA: Diagnosis present

## 2019-05-31 DIAGNOSIS — E871 Hypo-osmolality and hyponatremia: Secondary | ICD-10-CM | POA: Diagnosis present

## 2019-05-31 DIAGNOSIS — K7581 Nonalcoholic steatohepatitis (NASH): Secondary | ICD-10-CM | POA: Diagnosis present

## 2019-05-31 DIAGNOSIS — Z9049 Acquired absence of other specified parts of digestive tract: Secondary | ICD-10-CM | POA: Diagnosis not present

## 2019-05-31 DIAGNOSIS — L03116 Cellulitis of left lower limb: Secondary | ICD-10-CM | POA: Diagnosis present

## 2019-05-31 DIAGNOSIS — Z20828 Contact with and (suspected) exposure to other viral communicable diseases: Secondary | ICD-10-CM | POA: Diagnosis present

## 2019-05-31 DIAGNOSIS — L304 Erythema intertrigo: Secondary | ICD-10-CM | POA: Diagnosis present

## 2019-05-31 DIAGNOSIS — L8990 Pressure ulcer of unspecified site, unspecified stage: Secondary | ICD-10-CM | POA: Diagnosis present

## 2019-05-31 DIAGNOSIS — Z90710 Acquired absence of both cervix and uterus: Secondary | ICD-10-CM | POA: Diagnosis not present

## 2019-05-31 DIAGNOSIS — N3281 Overactive bladder: Secondary | ICD-10-CM | POA: Diagnosis present

## 2019-05-31 DIAGNOSIS — E039 Hypothyroidism, unspecified: Secondary | ICD-10-CM | POA: Diagnosis present

## 2019-05-31 DIAGNOSIS — Z85850 Personal history of malignant neoplasm of thyroid: Secondary | ICD-10-CM | POA: Diagnosis not present

## 2019-05-31 LAB — URINALYSIS, COMPLETE (UACMP) WITH MICROSCOPIC
Bilirubin Urine: NEGATIVE
Glucose, UA: NEGATIVE mg/dL
Hgb urine dipstick: NEGATIVE
Ketones, ur: NEGATIVE mg/dL
Nitrite: NEGATIVE
Protein, ur: NEGATIVE mg/dL
Specific Gravity, Urine: 1.025 (ref 1.005–1.030)
pH: 5 (ref 5.0–8.0)

## 2019-05-31 LAB — SARS CORONAVIRUS 2 (TAT 6-24 HRS): SARS Coronavirus 2: NEGATIVE

## 2019-05-31 LAB — COMPREHENSIVE METABOLIC PANEL
ALT: 14 U/L (ref 0–44)
AST: 24 U/L (ref 15–41)
Albumin: 2.4 g/dL — ABNORMAL LOW (ref 3.5–5.0)
Alkaline Phosphatase: 115 U/L (ref 38–126)
Anion gap: 10 (ref 5–15)
BUN: 21 mg/dL (ref 8–23)
CO2: 22 mmol/L (ref 22–32)
Calcium: 8.2 mg/dL — ABNORMAL LOW (ref 8.9–10.3)
Chloride: 101 mmol/L (ref 98–111)
Creatinine, Ser: 1.33 mg/dL — ABNORMAL HIGH (ref 0.44–1.00)
GFR calc Af Amer: 49 mL/min — ABNORMAL LOW (ref >60)
GFR calc non Af Amer: 42 mL/min — ABNORMAL LOW (ref >60)
Glucose, Bld: 150 mg/dL — ABNORMAL HIGH (ref 70–99)
Potassium: 3.9 mmol/L (ref 3.5–5.1)
Sodium: 133 mmol/L — ABNORMAL LOW (ref 135–145)
Total Bilirubin: 0.4 mg/dL (ref 0.3–1.2)
Total Protein: 5.8 g/dL — ABNORMAL LOW (ref 6.5–8.1)

## 2019-05-31 LAB — CBC
HCT: 28.3 % — ABNORMAL LOW (ref 36.0–46.0)
Hemoglobin: 8.6 g/dL — ABNORMAL LOW (ref 12.0–15.0)
MCH: 26.1 pg (ref 26.0–34.0)
MCHC: 30.4 g/dL (ref 30.0–36.0)
MCV: 85.8 fL (ref 80.0–100.0)
Platelets: 163 10*3/uL (ref 150–400)
RBC: 3.3 MIL/uL — ABNORMAL LOW (ref 3.87–5.11)
RDW: 14.8 % (ref 11.5–15.5)
WBC: 6 10*3/uL (ref 4.0–10.5)
nRBC: 0 % (ref 0.0–0.2)

## 2019-05-31 LAB — HEMOGLOBIN A1C
Hgb A1c MFr Bld: 7 % — ABNORMAL HIGH (ref 4.8–5.6)
Mean Plasma Glucose: 154.2 mg/dL

## 2019-05-31 LAB — GLUCOSE, CAPILLARY
Glucose-Capillary: 160 mg/dL — ABNORMAL HIGH (ref 70–99)
Glucose-Capillary: 157 mg/dL — ABNORMAL HIGH (ref 70–99)

## 2019-05-31 LAB — CBG MONITORING, ED
Glucose-Capillary: 138 mg/dL — ABNORMAL HIGH (ref 70–99)
Glucose-Capillary: 185 mg/dL — ABNORMAL HIGH (ref 70–99)

## 2019-05-31 MED ORDER — KETOCONAZOLE 2 % EX CREA
TOPICAL_CREAM | Freq: Two times a day (BID) | CUTANEOUS | Status: DC
  Administered 2019-05-31 – 2019-06-02 (×5): via TOPICAL
  Filled 2019-05-31 (×2): qty 15

## 2019-05-31 MED ORDER — NYSTATIN 100000 UNIT/GM EX POWD
Freq: Two times a day (BID) | CUTANEOUS | Status: DC
  Administered 2019-05-31 – 2019-06-02 (×5): via TOPICAL
  Filled 2019-05-31 (×3): qty 15

## 2019-05-31 MED ORDER — METHOCARBAMOL 500 MG PO TABS: 500.0000 mg | ORAL_TABLET | Freq: Four times a day (QID) | ORAL | Status: DC | PRN

## 2019-05-31 MED ORDER — SODIUM CHLORIDE 0.9 % IV SOLN
INTRAVENOUS | Status: DC
  Administered 2019-05-31: 06:00:00 via INTRAVENOUS

## 2019-05-31 MED ORDER — COLESEVELAM HCL 625 MG PO TABS
1875.0000 mg | ORAL_TABLET | Freq: Two times a day (BID) | ORAL | Status: DC
  Administered 2019-06-01 – 2019-06-02 (×3): 1875 mg via ORAL
  Filled 2019-05-31 (×6): qty 3

## 2019-05-31 MED ORDER — ALBUTEROL SULFATE HFA 108 (90 BASE) MCG/ACT IN AERS
2.0000 | INHALATION_SPRAY | Freq: Four times a day (QID) | RESPIRATORY_TRACT | Status: DC | PRN
  Filled 2019-05-31: qty 6.7

## 2019-05-31 MED ORDER — NYSTATIN 100000 UNIT/GM EX POWD: 100000.0000 g | Freq: Two times a day (BID) | CUTANEOUS | Status: DC

## 2019-05-31 MED ORDER — LEVOTHYROXINE SODIUM 100 MCG PO TABS
200.0000 ug | ORAL_TABLET | Freq: Every day | ORAL | Status: DC
  Administered 2019-05-31 – 2019-06-02 (×3): 200 ug via ORAL
  Filled 2019-05-31: qty 2
  Filled 2019-05-31: qty 4
  Filled 2019-05-31: qty 2

## 2019-05-31 MED ORDER — FESOTERODINE FUMARATE ER 4 MG PO TB24
4.0000 mg | ORAL_TABLET | Freq: Every day | ORAL | Status: DC
  Administered 2019-05-31 – 2019-06-02 (×3): 4 mg via ORAL
  Filled 2019-05-31 (×6): qty 1

## 2019-05-31 MED ORDER — SACCHAROMYCES BOULARDII 250 MG PO CAPS
250.0000 mg | ORAL_CAPSULE | Freq: Two times a day (BID) | ORAL | Status: DC
  Administered 2019-05-31 – 2019-06-02 (×4): 250 mg via ORAL
  Filled 2019-05-31 (×11): qty 1

## 2019-05-31 MED ORDER — DULOXETINE HCL 60 MG PO CPEP
120.0000 mg | ORAL_CAPSULE | Freq: Every day | ORAL | Status: DC
  Administered 2019-05-31 – 2019-06-02 (×3): 120 mg via ORAL
  Filled 2019-05-31 (×2): qty 2
  Filled 2019-05-31: qty 4

## 2019-05-31 MED ORDER — FLUCONAZOLE IN SODIUM CHLORIDE 200-0.9 MG/100ML-% IV SOLN
200.0000 mg | INTRAVENOUS | Status: DC
  Administered 2019-05-31: 200 mg via INTRAVENOUS
  Filled 2019-05-31 (×4): qty 100

## 2019-05-31 MED ORDER — APIXABAN 5 MG PO TABS
5.0000 mg | ORAL_TABLET | Freq: Two times a day (BID) | ORAL | Status: DC
  Administered 2019-05-31 – 2019-06-02 (×5): 5 mg via ORAL
  Filled 2019-05-31 (×5): qty 1

## 2019-05-31 MED ORDER — HYDROXYZINE HCL 25 MG PO TABS: 25.0000 mg | ORAL_TABLET | Freq: Four times a day (QID) | ORAL | Status: DC | PRN

## 2019-05-31 MED ORDER — INSULIN GLARGINE 100 UNIT/ML ~~LOC~~ SOLN
33.0000 [IU] | Freq: Every day | SUBCUTANEOUS | Status: DC
  Administered 2019-05-31 – 2019-06-01 (×3): 33 [IU] via SUBCUTANEOUS
  Filled 2019-05-31 (×4): qty 0.33

## 2019-05-31 MED ORDER — GABAPENTIN 300 MG PO CAPS
300.0000 mg | ORAL_CAPSULE | Freq: Every day | ORAL | Status: DC
  Administered 2019-05-31 – 2019-06-02 (×3): 300 mg via ORAL
  Filled 2019-05-31 (×2): qty 1
  Filled 2019-05-31: qty 3

## 2019-05-31 MED ORDER — CEFAZOLIN SODIUM-DEXTROSE 2-4 GM/100ML-% IV SOLN
2.0000 g | Freq: Three times a day (TID) | INTRAVENOUS | Status: DC
  Administered 2019-05-31: 06:00:00 2 g via INTRAVENOUS
  Filled 2019-05-31: qty 100

## 2019-05-31 MED ORDER — DILTIAZEM HCL ER BEADS 240 MG PO CP24
360.0000 mg | ORAL_CAPSULE | Freq: Every day | ORAL | Status: DC
  Administered 2019-05-31 – 2019-06-02 (×3): 360 mg via ORAL
  Filled 2019-05-31 (×6): qty 1

## 2019-05-31 MED ORDER — TRAZODONE HCL 50 MG PO TABS
100.0000 mg | ORAL_TABLET | Freq: Every day | ORAL | Status: DC
  Administered 2019-05-31 – 2019-06-01 (×2): 100 mg via ORAL
  Filled 2019-05-31 (×2): qty 2

## 2019-05-31 MED ORDER — INSULIN ASPART 100 UNIT/ML ~~LOC~~ SOLN
0.0000 [IU] | Freq: Three times a day (TID) | SUBCUTANEOUS | Status: DC
  Administered 2019-05-31: 09:00:00 1 [IU] via SUBCUTANEOUS
  Administered 2019-05-31 – 2019-06-01 (×4): 2 [IU] via SUBCUTANEOUS
  Administered 2019-06-01: 08:00:00 1 [IU] via SUBCUTANEOUS
  Administered 2019-06-02: 2 [IU] via SUBCUTANEOUS
  Filled 2019-05-31 (×2): qty 1

## 2019-05-31 MED ORDER — ONDANSETRON HCL 4 MG/2ML IJ SOLN: 4.0000 mg | Freq: Four times a day (QID) | INTRAMUSCULAR | Status: DC | PRN

## 2019-05-31 MED ORDER — PRAVASTATIN SODIUM 10 MG PO TABS
20.0000 mg | ORAL_TABLET | Freq: Every day | ORAL | Status: DC
  Administered 2019-05-31 – 2019-06-02 (×3): 20 mg via ORAL
  Filled 2019-05-31 (×2): qty 1
  Filled 2019-05-31: qty 2
  Filled 2019-05-31: qty 1
  Filled 2019-05-31: qty 2
  Filled 2019-05-31: qty 1

## 2019-05-31 MED ORDER — ONDANSETRON HCL 4 MG PO TABS: 4.0000 mg | ORAL_TABLET | Freq: Four times a day (QID) | ORAL | Status: DC | PRN

## 2019-05-31 MED ORDER — HYDROCODONE-ACETAMINOPHEN 5-325 MG PO TABS
1.0000 | ORAL_TABLET | ORAL | Status: DC | PRN
  Administered 2019-05-31 – 2019-06-02 (×2): 1 via ORAL
  Filled 2019-05-31 (×2): qty 1

## 2019-05-31 MED ORDER — TRAZODONE HCL 50 MG PO TABS: 100.0000 mg | ORAL_TABLET | Freq: Every day | ORAL | Status: DC

## 2019-05-31 MED ORDER — ALBUTEROL SULFATE (2.5 MG/3ML) 0.083% IN NEBU: 2.5000 mg | INHALATION_SOLUTION | Freq: Four times a day (QID) | RESPIRATORY_TRACT | Status: DC | PRN

## 2019-05-31 MED ORDER — LEVOTHYROXINE SODIUM 50 MCG PO TABS
50.0000 ug | ORAL_TABLET | Freq: Every day | ORAL | Status: DC
  Administered 2019-05-31 – 2019-06-02 (×3): 75 ug via ORAL
  Filled 2019-05-31 (×3): qty 2

## 2019-05-31 NOTE — Progress Notes (Signed)
ASSUMPTION OF CARE NOTE   05/31/2019 1:36 PM  [name] [name] was seen and examined.  The H&P by the admitting provider, orders, imaging was reviewed.  Please see new orders.  Her rash seems to be a yeast infection to me and we will treat her for that.  She says that she has had multiple similar yeast infections in past that look very similar.  IV fluconazole ordered and topical ketoconazole cream and nystatin powder ordered, gentle hydration ordered.  Will continue to follow.   Vitals:   05/31/19 0925 05/31/19 1231  BP:  94/82  Pulse:    Resp:  20  Temp:    SpO2: 100%     Results for orders placed or performed during the hospital encounter of 05/30/19  SARS CORONAVIRUS 2 (TAT 6-24 HRS) Nasopharyngeal Nasopharyngeal Swab   Specimen: Nasopharyngeal Swab  Result Value Ref Range   SARS Coronavirus 2 NEGATIVE NEGATIVE  Comprehensive metabolic panel  Result Value Ref Range   Sodium 129 (L) 135 - 145 mmol/L   Potassium 3.8 3.5 - 5.1 mmol/L   Chloride 98 98 - 111 mmol/L   CO2 24 22 - 32 mmol/L   Glucose, Bld 169 (H) 70 - 99 mg/dL   BUN 24 (H) 8 - 23 mg/dL   Creatinine, Ser 1.65 (H) 0.44 - 1.00 mg/dL   Calcium 8.5 (L) 8.9 - 10.3 mg/dL   Total Protein 7.1 6.5 - 8.1 g/dL   Albumin 2.9 (L) 3.5 - 5.0 g/dL   AST 27 15 - 41 U/L   ALT 17 0 - 44 U/L   Alkaline Phosphatase 136 (H) 38 - 126 U/L   Total Bilirubin 1.0 0.3 - 1.2 mg/dL   GFR calc non Af Amer 32 (L) >60 mL/min   GFR calc Af Amer 38 (L) >60 mL/min   Anion gap 7 5 - 15  CBC WITH DIFFERENTIAL  Result Value Ref Range   WBC 9.2 4.0 - 10.5 K/uL   RBC 3.39 (L) 3.87 - 5.11 MIL/uL   Hemoglobin 8.8 (L) 12.0 - 15.0 g/dL   HCT 29.4 (L) 36.0 - 46.0 %   MCV 86.7 80.0 - 100.0 fL   MCH 26.0 26.0 - 34.0 pg   MCHC 29.9 (L) 30.0 - 36.0 g/dL   RDW 15.3 11.5 - 15.5 %   Platelets 192 150 - 400 K/uL   nRBC 0.0 0.0 - 0.2 %   Neutrophils Relative % 80 %   Neutro Abs 7.3 1.7 - 7.7 K/uL   Lymphocytes Relative 9 %   Lymphs Abs 0.8 0.7 - 4.0 K/uL    Monocytes Relative 10 %   Monocytes Absolute 0.9 0.1 - 1.0 K/uL   Eosinophils Relative 1 %   Eosinophils Absolute 0.1 0.0 - 0.5 K/uL   Basophils Relative 0 %   Basophils Absolute 0.0 0.0 - 0.1 K/uL   Immature Granulocytes 0 %   Abs Immature Granulocytes 0.03 0.00 - 0.07 K/uL  Urinalysis, Complete w Microscopic  Result Value Ref Range   Color, Urine YELLOW YELLOW   APPearance CLEAR CLEAR   Specific Gravity, Urine 1.025 1.005 - 1.030   pH 5.0 5.0 - 8.0   Glucose, UA NEGATIVE NEGATIVE mg/dL   Hgb urine dipstick NEGATIVE NEGATIVE   Bilirubin Urine NEGATIVE NEGATIVE   Ketones, ur NEGATIVE NEGATIVE mg/dL   Protein, ur NEGATIVE NEGATIVE mg/dL   Nitrite NEGATIVE NEGATIVE   Leukocytes,Ua TRACE (A) NEGATIVE   RBC / HPF 0-5 0 - 5 RBC/hpf  WBC, UA 0-5 0 - 5 WBC/hpf   Bacteria, UA FEW (A) NONE SEEN   Squamous Epithelial / LPF 0-5 0 - 5   Mucus PRESENT   Ammonia  Result Value Ref Range   Ammonia 16 9 - 35 umol/L  Brain natriuretic peptide  Result Value Ref Range   B Natriuretic Peptide 25.0 0.0 - 100.0 pg/mL  Comprehensive metabolic panel  Result Value Ref Range   Sodium 133 (L) 135 - 145 mmol/L   Potassium 3.9 3.5 - 5.1 mmol/L   Chloride 101 98 - 111 mmol/L   CO2 22 22 - 32 mmol/L   Glucose, Bld 150 (H) 70 - 99 mg/dL   BUN 21 8 - 23 mg/dL   Creatinine, Ser 1.33 (H) 0.44 - 1.00 mg/dL   Calcium 8.2 (L) 8.9 - 10.3 mg/dL   Total Protein 5.8 (L) 6.5 - 8.1 g/dL   Albumin 2.4 (L) 3.5 - 5.0 g/dL   AST 24 15 - 41 U/L   ALT 14 0 - 44 U/L   Alkaline Phosphatase 115 38 - 126 U/L   Total Bilirubin 0.4 0.3 - 1.2 mg/dL   GFR calc non Af Amer 42 (L) >60 mL/min   GFR calc Af Amer 49 (L) >60 mL/min   Anion gap 10 5 - 15  Hemoglobin A1c  Result Value Ref Range   Hgb A1c MFr Bld 7.0 (H) 4.8 - 5.6 %   Mean Plasma Glucose 154.2 mg/dL  CBC  Result Value Ref Range   WBC 6.0 4.0 - 10.5 K/uL   RBC 3.30 (L) 3.87 - 5.11 MIL/uL   Hemoglobin 8.6 (L) 12.0 - 15.0 g/dL   HCT 28.3 (L) 36.0 - 46.0 %    MCV 85.8 80.0 - 100.0 fL   MCH 26.1 26.0 - 34.0 pg   MCHC 30.4 30.0 - 36.0 g/dL   RDW 14.8 11.5 - 15.5 %   Platelets 163 150 - 400 K/uL   nRBC 0.0 0.0 - 0.2 %  CBG monitoring, ED  Result Value Ref Range   Glucose-Capillary 168 (H) 70 - 99 mg/dL  POC CBG, ED  Result Value Ref Range   Glucose-Capillary 138 (H) 70 - 99 mg/dL  CBG monitoring, ED  Result Value Ref Range   Glucose-Capillary 185 (H) 70 - 99 mg/dL  Troponin I (High Sensitivity)  Result Value Ref Range   Troponin I (High Sensitivity) 7 <18 ng/L  Troponin I (High Sensitivity)  Result Value Ref Range   Troponin I (High Sensitivity) 7 <18 ng/L   Total time spent 40 minutes  Murvin Natal, MD [facility]   05/30/2019  5:40 PM How to contact the [facility] Attending or Consulting provider [place] or covering provider during after hours 7P -7A, for this patient?  1. Check the care team in [facility] and look for a) attending/consulting [facility] provider listed and b) the [facility] team listed 2. Log into [email or web address] and use [place]'s universal password to access. If you do not have the password, please contact the hospital operator. 3. Locate the [facility] provider you are looking for under [facility] and page to a number that you can be directly reached. 4. If you still have difficulty reaching the provider, please page the [facility] (Director on Call) for the Hospitalists listed on amion for assistance.

## 2019-05-31 NOTE — ED Notes (Signed)
Cherlyn Roberts, RN made aware of need for Ancef from upstairs pharmacy.
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
## 2019-05-31 NOTE — ED Notes (Signed)
Cherlyn Roberts, RN aware of need for Lantus from pharmacy upstairs.

## 2019-05-31 NOTE — ED Notes (Signed)
Pt given diet sprite per request.

## 2019-05-31 NOTE — ED Notes (Signed)
Per Dr Darrick Meigs- ok to allow pt to sleep and obtain IV access in the morning. Ok for fluids to be started at this time as well.

## 2019-06-01 DIAGNOSIS — B379 Candidiasis, unspecified: Secondary | ICD-10-CM

## 2019-06-01 DIAGNOSIS — N179 Acute kidney failure, unspecified: Secondary | ICD-10-CM | POA: Diagnosis present

## 2019-06-01 DIAGNOSIS — L89302 Pressure ulcer of unspecified buttock, stage 2: Secondary | ICD-10-CM

## 2019-06-01 DIAGNOSIS — K7581 Nonalcoholic steatohepatitis (NASH): Secondary | ICD-10-CM | POA: Diagnosis present

## 2019-06-01 DIAGNOSIS — B372 Candidiasis of skin and nail: Principal | ICD-10-CM

## 2019-06-01 LAB — GLUCOSE, CAPILLARY
Glucose-Capillary: 135 mg/dL — ABNORMAL HIGH (ref 70–99)
Glucose-Capillary: 176 mg/dL — ABNORMAL HIGH (ref 70–99)
Glucose-Capillary: 152 mg/dL — ABNORMAL HIGH (ref 70–99)
Glucose-Capillary: 171 mg/dL — ABNORMAL HIGH (ref 70–99)

## 2019-06-01 LAB — BASIC METABOLIC PANEL
Anion gap: 8 (ref 5–15)
BUN: 18 mg/dL (ref 8–23)
CO2: 23 mmol/L (ref 22–32)
Calcium: 8.2 mg/dL — ABNORMAL LOW (ref 8.9–10.3)
Chloride: 105 mmol/L (ref 98–111)
Creatinine, Ser: 0.8 mg/dL (ref 0.44–1.00)
GFR calc Af Amer: >60 mL/min (ref >60)
GFR calc non Af Amer: >60 mL/min (ref >60)
Glucose, Bld: 135 mg/dL — ABNORMAL HIGH (ref 70–99)
Potassium: 3.4 mmol/L — ABNORMAL LOW (ref 3.5–5.1)
Sodium: 136 mmol/L (ref 135–145)

## 2019-06-01 LAB — CBC
HCT: 27.3 % — ABNORMAL LOW (ref 36.0–46.0)
Hemoglobin: 8.3 g/dL — ABNORMAL LOW (ref 12.0–15.0)
MCH: 26.3 pg (ref 26.0–34.0)
MCHC: 30.4 g/dL (ref 30.0–36.0)
MCV: 86.7 fL (ref 80.0–100.0)
Platelets: 163 10*3/uL (ref 150–400)
RBC: 3.15 MIL/uL — ABNORMAL LOW (ref 3.87–5.11)
RDW: 15.1 % (ref 11.5–15.5)
WBC: 5.1 10*3/uL (ref 4.0–10.5)
nRBC: 0 % (ref 0.0–0.2)

## 2019-06-01 LAB — MAGNESIUM: Magnesium: 1.6 mg/dL — ABNORMAL LOW (ref 1.7–2.4)

## 2019-06-01 MED ORDER — FLUCONAZOLE 100 MG PO TABS
200.0000 mg | ORAL_TABLET | Freq: Every day | ORAL | Status: DC
  Administered 2019-06-01 – 2019-06-02 (×2): 200 mg via ORAL
  Filled 2019-06-01 (×2): qty 2

## 2019-06-01 MED ORDER — LISINOPRIL 10 MG PO TABS
20.0000 mg | ORAL_TABLET | Freq: Every day | ORAL | Status: DC
  Administered 2019-06-01 – 2019-06-02 (×2): 20 mg via ORAL
  Filled 2019-06-01 (×2): qty 2

## 2019-06-01 MED ORDER — POTASSIUM CHLORIDE CRYS ER 20 MEQ PO TBCR
40.0000 meq | EXTENDED_RELEASE_TABLET | Freq: Once | ORAL | Status: AC
  Administered 2019-06-01: 10:00:00 40 meq via ORAL
  Filled 2019-06-01: qty 2

## 2019-06-01 NOTE — Evaluation (Signed)
Physical Therapy Evaluation Patient Details Name: [name] [name] MRN: [id number] DOB: May 28, 1956 Today's Date: 06/01/2019   History of Present Illness  Lesly Pontarelli  is a 63 y.o. female, with history of morbid obesity, atrial fibrillation on Eliquis, CAD, COPD, diabetes mellitus type 2 on insulin therapy, [name], history of thyroid cancer, hypothyroidism, hypertension who came to hospital.  Patient said that she was confused at home.  She came to ED with complaints of abdominal pain, decreased p.o. intake.  CT of abdomen pelvis did not show acute finding.  Patient became confused yesterday, complains of hearing noises.    Clinical Impression  Patient required bed raised to assist with sit to stands due to BLE weakness, once standing able to support self safely using RW, limited to ambulation in room secondary to fatigue, no loss of balance and tolerated sitting up in chair after therapy.  Patient will benefit from continued physical therapy in hospital and recommended venue below to increase strength, balance, endurance for safe ADLs and gait.    Follow Up Recommendations Home health PT;Supervision for mobility/OOB;Supervision - Intermittent    Equipment Recommendations  3in1 (PT)(Heavy Duty wide BSC, shower chair)    Recommendations for Other Services       Precautions / Restrictions Precautions Precautions: Fall Restrictions Weight Bearing Restrictions: No      Mobility  Bed Mobility Overal bed mobility: Needs Assistance Bed Mobility: Supine to Sit     Supine to sit: Min assist;Min guard     General bed mobility comments: increased time, labored movement  Transfers Overall transfer level: Needs assistance Equipment used: Rolling walker (2 wheeled) Transfers: Sit to/from [facility] Sit to Stand: Min assist Stand pivot transfers: Min guard       General transfer comment: required bed raised slightly for completing sit to stands due to BLE  weakness  Ambulation/Gait Ambulation/Gait assistance: Supervision;Min guard Gait Distance (Feet): 22 Feet Assistive device: Rolling walker (2 wheeled) Gait Pattern/deviations: Decreased step length - right;Decreased step length - left;Decreased stride length Gait velocity: decreased   General Gait Details: slightly labored cadence without loss of balance, limited secondary to fatigue  Stairs            Wheelchair Mobility    Modified Rankin (Stroke Patients Only)       Balance Overall balance assessment: Needs assistance Sitting-balance support: Feet supported;No upper extremity supported Sitting balance-Leahy Scale: Good Sitting balance - Comments: seated at EOB   Standing balance support: During functional activity;Bilateral upper extremity supported Standing balance-Leahy Scale: Fair Standing balance comment: using RW                             Pertinent Vitals/Pain Pain Assessment: 0-10 Pain Score: 3  Pain Location: tail bone Pain Descriptors / Indicators: Aching;Sore Pain Intervention(s): Limited activity within patient's tolerance;Monitored during session    [place] expects to be discharged to:: Private residence Living Arrangements: Other relatives Available Help at Discharge: Family;Available PRN/intermittently Type of Home: House Home Access: Ramped entrance     Home Layout: One level Home Equipment: Walker - standard;Cane - quad;Wheelchair - manual;Walker - 2 wheels;Walker - 4 wheels;Bedside commode Additional Comments: Patient request wider BSC and shower chair    Prior Function Level of Independence: Independent with assistive device(s)         Comments: household ambulator without AD, uses quad cane for longer distances     Hand Dominance   Dominant Hand: Right  Extremity/Trunk Assessment   Upper Extremity Assessment Upper Extremity Assessment: Generalized weakness    Lower Extremity  Assessment Lower Extremity Assessment: Generalized weakness    Cervical / Trunk Assessment Cervical / Trunk Assessment: Normal  Communication   Communication: No difficulties  Cognition Arousal/Alertness: Awake/alert Behavior During Therapy: WFL for tasks assessed/performed Overall Cognitive Status: Within Functional Limits for tasks assessed                                        General Comments      Exercises     Assessment/Plan    PT Assessment Patient needs continued PT services  PT Problem List Decreased strength;Decreased activity tolerance;Decreased balance;Decreased mobility       PT Treatment Interventions Gait training;Balance training;Functional mobility training;Therapeutic activities;Therapeutic exercise;Patient/family education    PT Goals (Current goals can be found in the Care Plan section)  Acute Rehab PT Goals Patient Stated Goal: return home with family to assist PT Goal Formulation: With patient Time For Goal Achievement: 06/04/19 Potential to Achieve Goals: Good    Frequency Min 3X/week   Barriers to discharge        Co-evaluation               AM-PAC PT "6 Clicks" Mobility  Outcome Measure Help needed turning from your back to your side while in a flat bed without using bedrails?: None Help needed moving from lying on your back to sitting on the side of a flat bed without using bedrails?: A Little Help needed moving to and from a bed to a chair (including a wheelchair)?: A Little Help needed standing up from a chair using your arms (e.g., wheelchair or bedside chair)?: A Little Help needed to walk in hospital room?: A Little Help needed climbing 3-5 steps with a railing? : A Lot 6 Click Score: 18    End of Session   Activity Tolerance: Patient tolerated treatment well;Patient limited by fatigue Patient left: in chair;with call bell/phone within reach Nurse Communication: Mobility status PT Visit Diagnosis:  Unsteadiness on feet (R26.81);Other abnormalities of gait and mobility (R26.89);Muscle weakness (generalized) (M62.81)    Time: 0973-5329 PT Time Calculation (min) (ACUTE ONLY): 28 min   Charges:   PT Evaluation $PT Eval Moderate Complexity: 1 Mod PT Treatments $Therapeutic Activity: 23-37 mins        10:58 AM, 06/01/19 [name], MPT Physical Therapist with [facility] [phone] office [phone] mobile phone

## 2019-06-01 NOTE — Progress Notes (Signed)
PROGRESS NOTE [facility]   [name] [name]  EXB:[id number]  DOB: 1955/08/11  DOA: 05/30/2019 PCP: Redmond School, MD   Brief Admission Hx: 63 y/o female with afib on apixaban, COPD, CAD, type 2 DM, NASH, hypothyroidism, HTN who presented with AKI, confusion and severe cutaneous yeast infection.    MDM/Assessment & Plan:   1. Cutaneous candidiasis infection - Pt seems to be responding very well to IV fluconazole and will continue.  2. Stage 2 sacral skin breakdown - Wound care nurse and ambulatory referral to outpatient wound care.  Continue barrier protection.   3. AKI - resolved with gentle hydration, creatinine improved to 0.80.  4. Type 2 DM - well controlled, follow CBGs closely.  5. Essential hypertension - we temporarily held lisinopril due to AKI, she was resumed on home diltiazem. Plan to resume lisinopril today now that the AKI has resolved.  6. Hypothyroidism - h/o thyroid cancer, resume lifelong levothyroxine therapy.  7. Chronic atrial fibrillation - HR is well controlled with diltiazem and she is fully anticoagulated on apixaban.   DVT prophylaxis: apixaban  Code Status: Full  Family Communication: patient updated at bedside, verbalized understanding  Disposition Plan: home tomorrow if stable    Consultants:  Wound care   Procedures:    Antimicrobials:  Fluconazole 12/11>>   Subjective: Pt reports that her rash is less itchy which usually is a sign that it is getting better. She has a long history of getting these  Yeast rashes.   Objective: Vitals:   05/31/19 1730 05/31/19 2016 05/31/19 2112 06/01/19 0510  BP: (!) 144/67  (!) 151/57 (!) 151/63  Pulse: 61  69 81  Resp:   20 18  Temp: 97.8 F (36.6 C)  98.6 F (37 C) 98.4 F (36.9 C)  TempSrc: Oral  Oral   SpO2: 94% 98% 95% 98%  Weight:      Height:        Intake/Output Summary (Last 24 hours) at 06/01/2019 0940 Last data filed at 06/01/2019 0740 Gross per 24 hour  Intake 863.84 ml    Output 650 ml  Net 213.84 ml   Filed Weights   05/30/19 1732  Weight: 60.8 kg    REVIEW OF SYSTEMS  As per history otherwise all reviewed and reported negative  Exam:  General exam: awake, alert, NAD, cooperative.  Respiratory system: BBS Clear. No increased work of breathing. Cardiovascular system: irregularly irregular, normal S1 & S2 heard.  Gastrointestinal system: Abdomen is nondistended, soft and nontender. Normal bowel sounds heard. Central nervous system: Alert and oriented. No focal neurological deficits. SKIN: diffuse yeast rash in both legs folds, under folds of pannus and excess skin, it is showing clear signs of improvement from yesterday with less intense erythema, etc. No spreading of rash seen.  Extremities: chronic lymphedema venous stasis BLEs.  Data Reviewed: Basic Metabolic Panel: Recent Labs  Lab 05/29/19 0520 05/30/19 1827 05/31/19 0442 06/01/19 0554  NA 131* 129* 133* 136  K 4.1 3.8 3.9 3.4*  CL 96* 98 101 105  CO2 25 24 22 23   GLUCOSE 176* 169* 150* 135*  BUN 14 24* 21 18  CREATININE 0.91 1.65* 1.33* 0.80  CALCIUM 8.9 8.5* 8.2* 8.2*   Liver Function Tests: Recent Labs  Lab 05/29/19 0520 05/30/19 1827 05/31/19 0442  AST 22 27 24   ALT 15 17 14   ALKPHOS 117 136* 115  BILITOT 0.7 1.0 0.4  PROT 7.1 7.1 5.8*  ALBUMIN 3.0* 2.9* 2.4*   Recent  Labs  Lab 05/29/19 0520  LIPASE 15   Recent Labs  Lab 05/29/19 0624 05/30/19 1827  AMMONIA <9* 16   CBC: Recent Labs  Lab 05/29/19 0520 05/30/19 1827 05/31/19 0725 06/01/19 0554  WBC 8.3 9.2 6.0 5.1  NEUTROABS 6.4 7.3  --   --   HGB 9.5* 8.8* 8.6* 8.3*  HCT 31.3* 29.4* 28.3* 27.3*  MCV 86.5 86.7 85.8 86.7  PLT 184 192 163 163   Cardiac Enzymes: No results for input(s): CKTOTAL, CKMB, CKMBINDEX, TROPONINI in the last 168 hours. CBG (last 3)  Recent Labs    05/31/19 1641 05/31/19 2109 06/01/19 0736  GLUCAP 160* 157* 135*   Recent Results (from the past 240 hour(s))  SARS  CORONAVIRUS 2 (TAT 6-24 HRS) Nasopharyngeal Nasopharyngeal Swab     Status: None   Collection Time: 05/30/19  9:30 PM   Specimen: Nasopharyngeal Swab  Result Value Ref Range Status   SARS Coronavirus 2 NEGATIVE NEGATIVE Final    Comment: (NOTE) SARS-CoV-2 target nucleic acids are NOT DETECTED. The SARS-CoV-2 RNA is generally detectable in upper and lower respiratory specimens during the acute phase of infection. Negative results do not preclude SARS-CoV-2 infection, do not rule out co-infections with other pathogens, and should not be used as the sole basis for treatment or other patient management decisions. Negative results must be combined with clinical observations, patient history, and epidemiological information. The expected result is Negative. Fact Sheet for Patients: SugarRoll.be Fact Sheet for Healthcare Providers: https://www.woods-mathews.com/ This test is not yet approved or cleared by the [place] FDA and  has been authorized for detection and/or diagnosis of SARS-CoV-2 by FDA under an Emergency Use Authorization (EUA). This EUA will remain  in effect (meaning this test can be used) for the duration of the COVID-19 declaration under Section 56 4(b)(1) of the Act, 21 U.S.C. section 360bbb-3(b)(1), unless the authorization is terminated or revoked sooner. Performed at [facility], [place] [address], [place], [place] [address]      Studies: DG Chest 2 View  Result Date: 05/30/2019 CLINICAL DATA:  Confusion.  Dehydration. EXAM: CHEST - 2 VIEW COMPARISON:  05/21/2019 FINDINGS: The heart is significantly enlarged. Aortic calcifications are noted. The lung volumes are low. Bibasilar atelectasis is suspected. There is some elevation of the right hemidiaphragm. There are findings of vascular congestion/congestive heart failure. IMPRESSION: Cardiomegaly with findings of congestive heart failure. Electronically Signed   By:  Constance Holster M.D.   On: 05/30/2019 19:31   CT Head Wo Contrast  Result Date: 05/30/2019 CLINICAL DATA:  Increased confusion EXAM: CT HEAD WITHOUT CONTRAST TECHNIQUE: Contiguous axial images were obtained from the base of the skull through the vertex without intravenous contrast. COMPARISON:  05/21/2019 FINDINGS: Brain: No evidence of acute infarction, hemorrhage, hydrocephalus, extra-axial collection or mass lesion/mass effect. Vascular: No hyperdense vessel or unexpected calcification. Skull: Normal. Negative for fracture or focal lesion. Sinuses/Orbits: No acute finding. Other: Mild residual soft tissue swelling is noted in the forehead on the right. IMPRESSION: No acute intracranial abnormality noted. Electronically Signed   By: Inez Catalina M.D.   On: 05/30/2019 22:30   Scheduled Meds: . apixaban  5 mg Oral BID  . colesevelam  1,875 mg Oral BID WC  . diltiazem  360 mg Oral Daily  . DULoxetine  120 mg Oral Daily  . fesoterodine  4 mg Oral Daily  . gabapentin  300 mg Oral Daily  . insulin aspart  0-9 Units Subcutaneous TID WC  .  insulin glargine  33 Units Subcutaneous QHS  . ketoconazole   Topical BID  . levothyroxine  200 mcg Oral QAC breakfast  . levothyroxine  50-75 mcg Oral QAC breakfast  . nystatin   Topical BID  . potassium chloride  40 mEq Oral Once  . pravastatin  20 mg Oral Daily  . saccharomyces boulardii  250 mg Oral BID  . traZODone  100 mg Oral QHS   Continuous Infusions: . sodium chloride Stopped (05/31/19 1624)  . fluconazole (DIFLUCAN) IV Stopped (05/31/19 1242)    Principal Problem:   Candidiasis, intertrigo Active Problems:   Pressure injury of skin   Yeast infection  Time spent:   Irwin Brakeman, MD [facility] 06/01/2019, 9:40 AM    LOS: 1 day  How to contact the [facility] Attending or Consulting provider [place] or covering provider during after hours [place], for this patient?  1. Check the care team in [facility] and look for a)  attending/consulting [facility] provider listed and b) the [facility] team listed 2. Log into [email or web address] and use [place] universal password to access. If you do not have the password, please contact the hospital operator. 3. Locate the [facility] provider you are looking for under [facility] and page to a number that you can be directly reached. 4. If you still have difficulty reaching the provider, please page the [facility] (Director on Call) for the Hospitalists listed on amion for assistance.

## 2019-06-01 NOTE — Plan of Care (Signed)
  Problem: Acute Rehab PT Goals(only PT should resolve) Goal: Pt Will Go Supine/Side To Sit Outcome: Progressing Flowsheets (Taken 06/01/2019 1100) Pt will go Supine/Side to Sit:  with modified independence  with supervision Goal: Patient Will Transfer Sit To/From Stand Outcome: Progressing Flowsheets (Taken 06/01/2019 1100) Patient will transfer sit to/from stand: with supervision Goal: Pt Will Transfer Bed To Chair/Chair To Bed Outcome: Progressing Flowsheets (Taken 06/01/2019 1100) Pt will Transfer Bed to Chair/Chair to Bed: with supervision Goal: Pt Will Ambulate Outcome: Progressing Flowsheets (Taken 06/01/2019 1100) Pt will Ambulate:  75 feet  with supervision  with rolling walker   11:01 AM, 06/01/19 [name], MPT Physical Therapist with [facility] [phone] office [phone] mobile phone

## 2019-06-02 DIAGNOSIS — K7581 Nonalcoholic steatohepatitis (NASH): Secondary | ICD-10-CM

## 2019-06-02 LAB — GLUCOSE, CAPILLARY
Glucose-Capillary: 114 mg/dL — ABNORMAL HIGH (ref 70–99)
Glucose-Capillary: 170 mg/dL — ABNORMAL HIGH (ref 70–99)

## 2019-06-02 MED ORDER — FLUCONAZOLE 200 MG PO TABS: 200.0000 mg | ORAL_TABLET | Freq: Every day | ORAL | 0 refills | Status: AC

## 2019-06-02 MED ORDER — LEVOTHYROXINE SODIUM 75 MCG PO TABS: 75.0000 ug | ORAL_TABLET | Freq: Every day | ORAL | Status: DC

## 2019-06-02 MED ORDER — MELOXICAM 15 MG PO TABS: 15.0000 mg | ORAL_TABLET | Freq: Every day | ORAL | Status: DC | PRN

## 2019-06-02 NOTE — Discharge Summary (Signed)
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 access. If you do not have the password, please contact the hospital operator. 3. Locate the [facility] provider you are looking for under [facility] and page to a number that you can be directly reached. 4. If you still have difficulty reaching the provider, please page the [facility] (Director on Call) for the Hospitalists listed on amion for assistance.

## 2019-06-02 NOTE — Progress Notes (Signed)
Nsg Discharge Note  Admit Date:  05/30/2019 Discharge date: 06/02/2019   Rexene Alberts to be D/C'd Home per MD order.  AVS completed.  Patient able to verbalize understanding.  Discharge Medication: Allergies as of 06/02/2019      Reactions   Doxycycline Itching   Adhesive [tape] Other (See Comments)   Blisters skin   Biaxin [clarithromycin] Other (See Comments)   Really bad yeast infection   Percocet [oxycodone-acetaminophen] Itching   Xarelto [rivaroxaban] Itching   Clarithromycin Rash   Yeast Infection   Clindamycin/lincomycin Rash   Yeast Infection    Neosporin [neomycin-polymyxin B Gu] Rash   Eyes Drops only   Penicillins Rash   Has patient had a PCN reaction causing immediate rash, facial/tongue/throat swelling, SOB or lightheadedness with hypotension: Yes Has patient had a PCN reaction causing severe rash involving mucus membranes or skin necrosis: No Has patient had a PCN reaction that required hospitalization: No Has patient had a PCN reaction occurring within the last 10 years: No If all of the above answers are "NO", then may proceed with Cephalosporin use.      Medication List    STOP taking these medications   losartan 100 MG tablet Commonly known as: COZAAR     TAKE these medications   apixaban 5 MG Tabs tablet Commonly known as: Eliquis Take 1 tablet (5 mg total) by mouth 2 (two) times daily.   colesevelam 625 MG tablet Commonly known as: WELCHOL Take 1,875 mg by mouth as directed. 3 tabs am 3 tabs pm   diltiazem 360 MG 24 hr capsule Commonly known as: TIAZAC TAKE ONE CAPSULE (360MG TOTAL) BY MOUTH DAILY What changed: See the new instructions.   DULoxetine 60 MG capsule Commonly known as: CYMBALTA Take 120 mg by mouth daily.   econazole nitrate 1 % cream Apply topically daily. What changed:   how much to take  when to take this  reasons to take this   fluconazole 200 MG tablet Commonly known as: DIFLUCAN Take 1 tablet (200 mg total)  by mouth daily for 4 days. Start taking on: June 03, 2019   gabapentin 300 MG capsule Commonly known as: NEURONTIN Take 300 mg by mouth daily.   HYDROcodone-acetaminophen 5-325 MG tablet Commonly known as: NORCO/VICODIN Take 1 tablet by mouth every 4 (four) hours as needed. What changed: reasons to take this   hydrOXYzine 25 MG tablet Commonly known as: ATARAX/VISTARIL Take 25 mg by mouth 4 (four) times daily as needed for itching.   Lantus SoloStar 100 UNIT/ML Solostar Pen Generic drug: Insulin Glargine Inject 33 Units into the skin at bedtime.   levothyroxine 200 MCG tablet Commonly known as: SYNTHROID Take 200 mcg by mouth daily before breakfast. Take in addition to 75 mcg for a total of 275 mcg daily What changed: Another medication with the same name was changed. Make sure you understand how and when to take each.   levothyroxine 75 MCG tablet Commonly known as: SYNTHROID Take 1 tablet (75 mcg total) by mouth daily before breakfast. What changed: how much to take   lisinopril 20 MG tablet Commonly known as: ZESTRIL Take 20 mg by mouth daily.   meloxicam 15 MG tablet Commonly known as: MOBIC Take 1 tablet (15 mg total) by mouth daily as needed for pain. What changed:   when to take this  reasons to take this   metFORMIN 1000 MG tablet Commonly known as: GLUCOPHAGE Take 1,000 mg by mouth 2 (two) times daily with  a meal.   methocarbamol 500 MG tablet Commonly known as: ROBAXIN Take 1 tablet (500 mg total) by mouth every 6 (six) hours as needed for muscle spasms.   metolazone 5 MG tablet Commonly known as: ZAROXOLYN Take 5 mg by mouth daily.   nystatin powder Generic drug: nystatin Apply 100,000 g topically daily as needed (for rash-irritation). Rash   nystatin cream Commonly known as: MYCOSTATIN Apply to affected area 2 times daily   potassium chloride SA 20 MEQ tablet Commonly known as: KLOR-CON Take 20 mEq by mouth 2 (two) times daily.    pravastatin 20 MG tablet Commonly known as: PRAVACHOL Take 20 mg by mouth daily.   Proventil HFA 108 (90 Base) MCG/ACT inhaler Generic drug: albuterol Inhale 2 puffs into the lungs every 6 (six) hours as needed for wheezing or shortness of breath. Shortness of breath   tolterodine 4 MG 24 hr capsule Commonly known as: DETROL LA Take 4 mg by mouth 2 (two) times a day.   traZODone 100 MG tablet Commonly known as: DESYREL Take 100-200 mg by mouth at bedtime.            Durable Medical Equipment  (From admission, onward)         Start     Ordered   06/02/19 0740  For home use only DME 3 n 1  Once     06/02/19 0740          Discharge Assessment: Vitals:   06/02/19 0552 06/02/19 0915  BP: (!) 151/64 (!) 178/76  Pulse: 81   Resp: 17   Temp: 98.1 F (36.7 C)   SpO2: 99%    Skin clean, dry and intact without evidence of skin break down, no evidence of skin tears noted. IV catheter discontinued intact. Site without signs and symptoms of complications - no redness or edema noted at insertion site, patient denies c/o pain - only slight tenderness at site.  Dressing with slight pressure applied.  D/c Instructions-Education: Discharge instructions given to patient with verbalized understanding. D/c education completed with patient including follow up instructions, medication list, d/c activities limitations if indicated, with other d/c instructions as indicated by MD - patient able to verbalize understanding, all questions fully answered. Patient instructed to return to ED, call 911, or call MD for any changes in condition.  Patient escorted via [place], and D/C home via private auto.  Berton Bon, RN 06/02/2019 2:29 PM

## 2019-06-02 NOTE — Discharge Instructions (Signed)
Skin Yeast Infection  A skin yeast infection is a condition in which there is an overgrowth of yeast (candida) that normally lives on the skin. This condition usually occurs in areas of the skin that are constantly warm and moist, such as the armpits or the groin. What are the causes? This condition is caused by a change in the normal balance of the yeast and bacteria that live on the skin. What increases the risk? You are more likely to develop this condition if you:  Are obese.  Are pregnant.  Take birth control pills.  Have diabetes.  Take antibiotic medicines.  Take steroid medicines.  Are malnourished.  Have a weak body defense system (immune system).  Are 64 years of age or older.  Wear tight clothing. What are the signs or symptoms? The most common symptom of this condition is itchiness in the affected area. Other symptoms include:  Red, swollen area of the skin.  Bumps on the skin. How is this diagnosed?  This condition is diagnosed with a medical history and physical exam.  Your health care provider may check for yeast by taking light scrapings of the skin to be viewed under a microscope. How is this treated? This condition is treated with medicine. Medicines may be prescribed or be available over the counter. The medicines may be:  Taken by mouth (orally).  Applied as a cream or powder to your skin. Follow these instructions at home:   Take or apply over-the-counter and prescription medicines only as told by your health care provider.  Maintain a healthy weight. If you need help losing weight, talk with your health care provider.  Keep your skin clean and dry.  If you have diabetes, keep your blood sugar under control.  Keep all follow-up visits as told by your health care provider. This is important. Contact a health care provider if:  Your symptoms go away and then return.  Your symptoms do not get better with treatment.  Your symptoms get  worse.  Your rash spreads.  You have a fever or chills.  You have new symptoms.  You have new warmth or redness of your skin. Summary  A skin yeast infection is a condition in which there is an overgrowth of yeast (candida) that normally lives on the skin. This condition is caused by a change in the normal balance of the yeast and bacteria that live on the skin.  Take or apply over-the-counter and prescription medicines only as told by your health care provider.  Keep your skin clean and dry.  Contact a health care provider if your symptoms do not get better with treatment. This information is not intended to replace advice given to you by your health care provider. Make sure you discuss any questions you have with your health care provider. Document Released: 02/22/2011 Document Revised: 10/24/2017 Document Reviewed: 10/24/2017 Elsevier Patient Education  2020 [place] INFORMATION: PAY CLOSE ATTENTION   PHYSICIAN DISCHARGE INSTRUCTIONS  Follow with Primary care provider  Redmond School, MD  and other consultants as instructed by your Hospitalist Physician  [place] IF SYMPTOMS COME BACK, WORSEN OR NEW PROBLEM DEVELOPS   Please note: You were cared for by a hospitalist during your hospital stay. Every effort will be made to forward records to your primary care provider.  You can request that your primary care provider send for your hospital records if they have not received them.  Once you are  discharged, your primary care physician will handle any further medical issues. Please note that NO REFILLS for any discharge medications will be authorized once you are discharged, as it is imperative that you return to your primary care physician (or establish a relationship with a primary care physician if you do not have one) for your post hospital discharge needs so that they can reassess your need for medications and monitor your lab  values.  Please get a complete blood count and chemistry panel checked by your Primary MD at your next visit, and again as instructed by your Primary MD.  Get Medicines reviewed and adjusted: Please take all your medications with you for your next visit with your Primary MD  Laboratory/radiological data: Please request your Primary MD to go over all hospital tests and procedure/radiological results at the follow up, please ask your primary care provider to get all Hospital records sent to his/her office.  In some cases, they will be blood work, cultures and biopsy results pending at the time of your discharge. Please request that your primary care provider follow up on these results.  If you are diabetic, please bring your blood sugar readings with you to your follow up appointment with primary care.    Please call and make your follow up appointments as soon as possible.    Also Note the following: If you experience worsening of your admission symptoms, develop shortness of breath, life threatening emergency, suicidal or homicidal thoughts you must seek medical attention immediately by calling 911 or calling your MD immediately  if symptoms less severe.  You must read complete instructions/literature along with all the possible adverse reactions/side effects for all the Medicines you take and that have been prescribed to you. Take any new Medicines after you have completely understood and accpet all the possible adverse reactions/side effects.   Do not drive when taking Pain medications or sleeping medications (Benzodiazepines)  Do not take more than prescribed Pain, Sleep and Anxiety Medications. It is not advisable to combine anxiety,sleep and pain medications without talking with your primary care practitioner  Special Instructions: If you have smoked or chewed Tobacco  in the last 2 yrs please stop smoking, stop any regular Alcohol  and or any Recreational drug use.  Wear Seat belts  while driving.  Do not drive if taking any narcotic, mind altering or controlled substances or recreational drugs or alcohol.

## 2019-06-06 DIAGNOSIS — E669 Obesity, unspecified: Secondary | ICD-10-CM | POA: Diagnosis not present

## 2019-06-06 DIAGNOSIS — L03311 Cellulitis of abdominal wall: Secondary | ICD-10-CM | POA: Diagnosis not present

## 2019-06-06 DIAGNOSIS — R600 Localized edema: Secondary | ICD-10-CM | POA: Diagnosis not present

## 2019-06-06 DIAGNOSIS — E65 Localized adiposity: Secondary | ICD-10-CM | POA: Diagnosis not present

## 2019-06-07 ENCOUNTER — Emergency Department (HOSPITAL_COMMUNITY): Payer: PPO

## 2019-06-07 ENCOUNTER — Encounter (HOSPITAL_COMMUNITY): Payer: Self-pay | Admitting: Emergency Medicine

## 2019-06-07 ENCOUNTER — Emergency Department (HOSPITAL_COMMUNITY)
Admission: EM | Admit: 2019-06-07 | Discharge: 2019-06-07 | Disposition: A | Discharge status: Home / Self Care | Payer: PPO | Attending: Emergency Medicine | Admitting: Emergency Medicine

## 2019-06-07 ENCOUNTER — Other Ambulatory Visit: Payer: Self-pay

## 2019-06-07 DIAGNOSIS — Z7984 Long term (current) use of oral hypoglycemic drugs: Secondary | ICD-10-CM | POA: Insufficient documentation

## 2019-06-07 DIAGNOSIS — R5381 Other malaise: Secondary | ICD-10-CM | POA: Diagnosis not present

## 2019-06-07 DIAGNOSIS — I4891 Unspecified atrial fibrillation: Secondary | ICD-10-CM | POA: Diagnosis not present

## 2019-06-07 DIAGNOSIS — I10 Essential (primary) hypertension: Secondary | ICD-10-CM | POA: Insufficient documentation

## 2019-06-07 DIAGNOSIS — Z79899 Other long term (current) drug therapy: Secondary | ICD-10-CM | POA: Insufficient documentation

## 2019-06-07 DIAGNOSIS — E039 Hypothyroidism, unspecified: Secondary | ICD-10-CM | POA: Insufficient documentation

## 2019-06-07 DIAGNOSIS — R9431 Abnormal electrocardiogram [ECG] [EKG]: Secondary | ICD-10-CM | POA: Diagnosis not present

## 2019-06-07 DIAGNOSIS — Z743 Need for continuous supervision: Secondary | ICD-10-CM | POA: Diagnosis not present

## 2019-06-07 DIAGNOSIS — R42 Dizziness and giddiness: Secondary | ICD-10-CM | POA: Diagnosis not present

## 2019-06-07 DIAGNOSIS — R531 Weakness: Secondary | ICD-10-CM | POA: Diagnosis not present

## 2019-06-07 DIAGNOSIS — Z7901 Long term (current) use of anticoagulants: Secondary | ICD-10-CM | POA: Diagnosis not present

## 2019-06-07 DIAGNOSIS — E119 Type 2 diabetes mellitus without complications: Secondary | ICD-10-CM | POA: Diagnosis not present

## 2019-06-07 DIAGNOSIS — I2510 Atherosclerotic heart disease of native coronary artery without angina pectoris: Secondary | ICD-10-CM | POA: Diagnosis not present

## 2019-06-07 DIAGNOSIS — E860 Dehydration: Secondary | ICD-10-CM | POA: Diagnosis present

## 2019-06-07 DIAGNOSIS — J449 Chronic obstructive pulmonary disease, unspecified: Secondary | ICD-10-CM | POA: Insufficient documentation

## 2019-06-07 LAB — CBC WITH DIFFERENTIAL/PLATELET
Abs Immature Granulocytes: 0.03 10*3/uL (ref 0.00–0.07)
Basophils Absolute: 0 10*3/uL (ref 0.0–0.1)
Basophils Relative: 0 %
Eosinophils Absolute: 0.2 10*3/uL (ref 0.0–0.5)
Eosinophils Relative: 3 %
HCT: 30.1 % — ABNORMAL LOW (ref 36.0–46.0)
Hemoglobin: 9 g/dL — ABNORMAL LOW (ref 12.0–15.0)
Immature Granulocytes: 0 %
Lymphocytes Relative: 13 %
Lymphs Abs: 0.9 10*3/uL (ref 0.7–4.0)
MCH: 25.9 pg — ABNORMAL LOW (ref 26.0–34.0)
MCHC: 29.9 g/dL — ABNORMAL LOW (ref 30.0–36.0)
MCV: 86.5 fL (ref 80.0–100.0)
Monocytes Absolute: 0.4 10*3/uL (ref 0.1–1.0)
Monocytes Relative: 6 %
Neutro Abs: 5.3 10*3/uL (ref 1.7–7.7)
Neutrophils Relative %: 78 %
Platelets: 178 10*3/uL (ref 150–400)
RBC: 3.48 MIL/uL — ABNORMAL LOW (ref 3.87–5.11)
RDW: 15.1 % (ref 11.5–15.5)
WBC: 7 10*3/uL (ref 4.0–10.5)
nRBC: 0 % (ref 0.0–0.2)

## 2019-06-07 LAB — COMPREHENSIVE METABOLIC PANEL WITH GFR
ALT: 15 U/L (ref 0–44)
AST: 20 U/L (ref 15–41)
Albumin: 3.1 g/dL — ABNORMAL LOW (ref 3.5–5.0)
Alkaline Phosphatase: 219 U/L — ABNORMAL HIGH (ref 38–126)
Anion gap: 8 (ref 5–15)
BUN: 18 mg/dL (ref 8–23)
CO2: 24 mmol/L (ref 22–32)
Calcium: 8.8 mg/dL — ABNORMAL LOW (ref 8.9–10.3)
Chloride: 101 mmol/L (ref 98–111)
Creatinine, Ser: 0.95 mg/dL (ref 0.44–1.00)
GFR calc Af Amer: >60 mL/min
GFR calc non Af Amer: >60 mL/min
Glucose, Bld: 142 mg/dL — ABNORMAL HIGH (ref 70–99)
Potassium: 3.7 mmol/L (ref 3.5–5.1)
Sodium: 133 mmol/L — ABNORMAL LOW (ref 135–145)
Total Bilirubin: 0.6 mg/dL (ref 0.3–1.2)
Total Protein: 7.4 g/dL (ref 6.5–8.1)

## 2019-06-07 LAB — ETHANOL: Alcohol, Ethyl (B): <10 mg/dL

## 2019-06-07 LAB — RAPID URINE DRUG SCREEN, HOSP PERFORMED
Amphetamines: NOT DETECTED
Barbiturates: NOT DETECTED
Benzodiazepines: NOT DETECTED
Cocaine: NOT DETECTED
Opiates: NOT DETECTED
Tetrahydrocannabinol: NOT DETECTED

## 2019-06-07 LAB — URINALYSIS, ROUTINE W REFLEX MICROSCOPIC
Bilirubin Urine: NEGATIVE
Glucose, UA: NEGATIVE mg/dL
Ketones, ur: NEGATIVE mg/dL
Nitrite: POSITIVE — AB
Protein, ur: NEGATIVE mg/dL
Specific Gravity, Urine: 1.013 (ref 1.005–1.030)
pH: 5 (ref 5.0–8.0)

## 2019-06-07 LAB — BLOOD GAS, VENOUS
Acid-Base Excess: 0.9 mmol/L (ref 0.0–2.0)
Bicarbonate: 23.8 mmol/L (ref 20.0–28.0)
FIO2: 21
O2 Saturation: 28.6 %
Patient temperature: 37
pCO2, Ven: 47.4 mmHg (ref 44.0–60.0)
pH, Ven: 7.355 (ref 7.250–7.430)
pO2, Ven: <31 mmHg — CL (ref 32.0–45.0)

## 2019-06-07 LAB — AMMONIA: Ammonia: 15 umol/L (ref 9–35)

## 2019-06-07 NOTE — ED Notes (Signed)
Pt o2 did not fall below 96% while ambulating, steady gait w/stand by assist, PA made aware.

## 2019-06-07 NOTE — Discharge Instructions (Signed)
Please read and follow all provided instructions.  Your diagnoses today include:  1. Weakness     Tests performed today include:  Blood cell counts and electrolytes  Ammonia test  Chest x-ray -no pneumonia  Urine test -pending  Vital signs. See below for your results today.   Medications prescribed:   None  Take any prescribed medications only as directed.  Home care instructions:  Follow any educational materials contained in this packet.  BE VERY CAREFUL not to take multiple medicines containing Tylenol (also called acetaminophen). Doing so can lead to an overdose which can damage your liver and cause liver failure and possibly death.   Follow-up instructions: Please follow-up with your primary care provider in the next 3 days for further evaluation of your symptoms.   Return instructions:   Please return to the Emergency Department if you experience worsening symptoms.   Please return if you have any other emergent concerns.  Additional Information:  Your vital signs today were: BP (!) 139/59 (BP Location: Right Wrist)   Pulse 96   Temp 98.5 F (36.9 C) (Oral)   Resp 18   Ht 5' 5"  (1.651 m)   Wt 133.4 kg   SpO2 90%   BMI 48.92 kg/m  If your blood pressure (BP) was elevated above 135/85 this visit, please have this repeated by your doctor within one month. --------------

## 2019-06-07 NOTE — ED Provider Notes (Signed)
[facility] EMERGENCY DEPARTMENT Provider Note   CSN: [id number] Arrival date & time: 06/07/19  1640     History No chief complaint on file.   [name] [name] is a 63 y.o. female.  Patient with afib on apixaban, COPD, CAD, type 2 DM, NASH, hypothyroidism, HTN admitted 12/10-13 with AKI, confusion and severe cutaneous yeast infection. Per discharge summary she had a good response with fluconazole. Patient presents today with complaint of dehydration.  She states that her mouth and lips feel dry.  This is what prompted her to seek ED evaluation.  Patient is oriented on exam however she does seem very sleepy and when asked about this, states that she feels tired all the time.  She states improvement in her skin rash present at previous admission.  She denies any fevers, URI or flu symptoms, chest pain or cough.  She denies any nausea, vomiting, or diarrhea.  She states that she is eating and drinking well.  She denies irritative urinary tract infection symptoms or hematuria.  She denies lower extremity swelling but states that she has ongoing right knee pain similar to when she broke her tibial plateau last year.  She states that she has been ambulating "very carefully" and denies any recent falls.        Past Medical History:  Diagnosis Date  . Anemia   . Anxiety   . Arthritis   . Asthma   . Atrial fibrillation ([place])   . Cirrhosis of liver ([place])   . COPD (chronic obstructive pulmonary disease) ([place])   . Coronary artery disease   . Depression   . Edema   . Essential hypertension, benign   . GERD (gastroesophageal reflux disease)   . Hypothyroidism   . Morbid obesity ([place])   . Overactive bladder   . Sleep apnea    CPAP - not consistent  . Thyroid cancer ([place])   . Type 2 diabetes mellitus [facility])     Patient Active Problem List   Diagnosis Date Noted  . AKI (acute kidney injury) ([place]) 06/01/2019  . NASH (nonalcoholic steatohepatitis) 06/01/2019  . Pressure injury of skin  05/31/2019  . Candidiasis, intertrigo 05/31/2019  . Yeast infection 05/31/2019  . Fever 12/24/2018  . Hyponatremia 12/24/2018  . Abnormal liver function 12/24/2018  . Sepsis due to undetermined organism ([place]) 11/07/2018  . Atrial fibrillation ([place]) 11/07/2018  . COPD (chronic obstructive pulmonary disease) ([place]) 11/07/2018  . GERD (gastroesophageal reflux disease) 11/07/2018  . Hypothyroidism 11/07/2018  . Sleep apnea 11/07/2018  . Hepatic cirrhosis ([place]) 09/01/2014  . Cirrhosis, nonalcoholic ([place]) 04/13/8526  . Incisional hernia 06/09/2014  . S/P complete repair of rotator cuff 07/24/2012  . Precordial pain 09/30/2010  . Essential hypertension, benign 09/30/2010  . Type 2 diabetes mellitus ([place]) 09/05/2008  . OBESITY-MORBID (>100') 09/05/2008  . ESOPHAGEAL REFLUX 09/05/2008  . EDEMA 09/05/2008    Past Surgical History:  Procedure Laterality Date  . "pump bumps"     bilateral heels--2000  . ABDOMINAL HYSTERECTOMY  1985  . CARDIAC CATHETERIZATION  2012   Dr. Lia Foyer told her nothing was wrong.  [name] [place]  . CHOLECYSTECTOMY  1996  . COLONOSCOPY WITH PROPOFOL N/A 04/17/2015   Procedure: COLONOSCOPY WITH PROPOFOL  at cecum at 0810; withdrawal time =15 minutes;  Surgeon: [name], MD;  Location: [facility];  Service: Endoscopy;  Laterality: N/A;  . Debridement of abdominal wound  2011  . ESOPHAGEAL DILATION N/A 04/17/2015   Procedure: ESOPHAGEAL DILATION  WITH 56FR MALONEY DILATOR;  Surgeon: [name], MD;  Location: [facility];  Service: Endoscopy;  Laterality: N/A;  . ESOPHAGOGASTRODUODENOSCOPY (EGD) WITH PROPOFOL N/A 04/17/2015   Procedure: ESOPHAGOGASTRODUODENOSCOPY (EGD) WITH PROPOFOL;  Surgeon: [name], MD;  Location: [facility];  Service: Endoscopy;  Laterality: N/A;  . EXPLORATORY LAPAROTOMY  2003  . FOOT SURGERY Bilateral 2000   Bone spur removed  . FOOT SURGERY    . INCISIONAL HERNIA REPAIR N/A 06/09/2014   Procedure: RECURRENT  INCISIONAL  HERNIORRHAPHY WITH MESH;  Surgeon: [name], MD;  Location: [facility];  Service: General;  Laterality: N/A;  . INCISIONAL HERNIA REPAIR N/A 10/15/2014   Procedure: [place];  Surgeon: [name], MD;  Location: [place];  Service: General;  Laterality: N/A;  . INSERTION OF MESH N/A 06/09/2014   Procedure: INSERTION OF MESH;  Surgeon: [name], MD;  Location: [facility];  Service: General;  Laterality: N/A;  . INSERTION OF MESH N/A 10/15/2014   Procedure: INSERTION OF MESH;  Surgeon: [name], MD;  Location: [place];  Service: General;  Laterality: N/A;  . POLYPECTOMY  04/17/2015   Procedure: POLYPECTOMY;  Surgeon: [name], MD;  Location: [facility];  Service: Endoscopy;;  . RESECTION DISTAL CLAVICAL  05/07/2012   Procedure: RESECTION DISTAL CLAVICAL;  Surgeon: [name], MD;  Location: [facility];  Service: Orthopedics;  Laterality: Right;  . SHOULDER OPEN ROTATOR CUFF REPAIR  05/07/2012   Procedure: ROTATOR CUFF REPAIR SHOULDER OPEN;  Surgeon: [name], MD;  Location: [facility];  Service: Orthopedics;  Laterality: Right;  . TEE WITHOUT CARDIOVERSION N/A 12/26/2018   Procedure: TRANSESOPHAGEAL ECHOCARDIOGRAM (TEE) WITH PROPOFOL;  Surgeon: Arnoldo Lenis, MD;  [facility];  Service: Endoscopy;  Laterality: N/A;  . THYROIDECTOMY  2009  . TONSILLECTOMY  1958  . UMBILICAL HERNIA REPAIR  2010     OB History   No obstetric history on file.     Family History  Problem Relation Age of Onset  . Heart disease Other   . Arthritis Other   . Cancer Other   . Asthma Other   . Diabetes Other   . Kidney disease Other     Social History   Tobacco Use  . Smoking status: Never Smoker  . Smokeless tobacco: Never Used  Substance Use Topics  . Alcohol use: No    Alcohol/week: 0.0 standard drinks  . Drug use: No    Home Medications Prior to Admission medications   Medication Sig Start Date End Date Taking? Authorizing  Provider  albuterol (PROVENTIL HFA) 108 (90 BASE) MCG/ACT inhaler Inhale 2 puffs into the lungs every 6 (six) hours as needed for wheezing or shortness of breath. Shortness of breath    [provider]  apixaban (ELIQUIS) 5 MG TABS tablet Take 1 tablet (5 mg total) by mouth 2 (two) times daily. 11/19/18   Satira Sark, MD  colesevelam [facility]) 625 MG tablet Take 1,875 mg by mouth as directed. 3 tabs am 3 tabs pm    [provider]  diltiazem (TIAZAC) 360 MG 24 hr capsule TAKE ONE CAPSULE (360MG TOTAL) BY MOUTH DAILY Patient taking differently: Take 360 mg by mouth daily.  05/20/19   Strader, Fransisco Hertz, PA-C  DULoxetine (CYMBALTA) 60 MG capsule Take 120 mg by mouth daily.  03/20/15   [provider]  econazole nitrate 1 % cream Apply topically daily. Patient taking differently: Apply 1 application  topically daily as needed (for rash irritation).  12/09/15   Tuchman, Richard C, DPM  fluconazole (DIFLUCAN) 200 MG tablet Take 1 tablet (200 mg total) by mouth daily for 4 days. 06/03/19 06/07/19  Johnson, Clanford L, MD  gabapentin (NEURONTIN) 300 MG capsule Take 300 mg by mouth daily. 10/19/18   [provider]  HYDROcodone-acetaminophen (NORCO/VICODIN) 5-325 MG tablet Take 1 tablet by mouth every 4 (four) hours as needed. Patient taking differently: Take 1 tablet by mouth every 4 (four) hours as needed for moderate pain or severe pain.  12/04/18   Lily Kocher, PA-C  hydrOXYzine (ATARAX/VISTARIL) 25 MG tablet Take 25 mg by mouth 4 (four) times daily as needed for itching.  10/12/18   [provider]  LANTUS SOLOSTAR 100 UNIT/ML Solostar Pen Inject 33 Units into the skin at bedtime.  09/21/18   [provider]  levothyroxine (SYNTHROID) 200 MCG tablet Take 200 mcg by mouth daily before breakfast. Take in addition to 75 mcg for a total of 275 mcg daily    [provider]  levothyroxine (SYNTHROID) 75 MCG tablet Take 1 tablet (75 mcg total) by  mouth daily before breakfast. 06/02/19   Johnson, Clanford L, MD  lisinopril (ZESTRIL) 20 MG tablet Take 20 mg by mouth daily. 04/22/19   [provider]  meloxicam (MOBIC) 15 MG tablet Take 1 tablet (15 mg total) by mouth daily as needed for pain. 06/02/19   Johnson, Clanford L, MD  metFORMIN (GLUCOPHAGE) 1000 MG tablet Take 1,000 mg by mouth 2 (two) times daily with a meal.    [provider]  methocarbamol (ROBAXIN) 500 MG tablet Take 1 tablet (500 mg total) by mouth every 6 (six) hours as needed for muscle spasms. 12/26/18   Manuella Ghazi, Pratik D, DO  metolazone (ZAROXOLYN) 5 MG tablet Take 5 mg by mouth daily. 05/09/19   [provider]  nystatin (NYSTOP) 100000 UNIT/GM POWD Apply 100,000 g topically daily as needed (for rash-irritation). Rash 08/18/10   [provider]  nystatin cream (MYCOSTATIN) Apply to affected area 2 times daily 05/29/19   Elnora Morrison, MD  potassium chloride SA (K-DUR,KLOR-CON) 20 MEQ tablet Take 20 mEq by mouth 2 (two) times daily.  04/27/18   [provider]  pravastatin (PRAVACHOL) 20 MG tablet Take 20 mg by mouth daily. 10/30/18   [provider]  tolterodine (DETROL LA) 4 MG 24 hr capsule Take 4 mg by mouth 2 (two) times a day. 08/17/18   [provider]  traZODone (DESYREL) 100 MG tablet Take 100-200 mg by mouth at bedtime. 09/21/18   [provider]    Allergies    Doxycycline, Adhesive [tape], Biaxin [clarithromycin], Percocet [oxycodone-acetaminophen], Xarelto [rivaroxaban], Clarithromycin, Clindamycin/lincomycin, Neosporin [neomycin-polymyxin b gu], and Penicillins  Review of Systems   Review of Systems  Constitutional: Negative for fever.  HENT: Negative for rhinorrhea and sore throat.   Eyes: Negative for redness.  Respiratory: Negative for cough.   Cardiovascular: Negative for chest pain.  Gastrointestinal: Negative for abdominal pain, diarrhea, nausea and vomiting.  Genitourinary: Negative for  dysuria.  Musculoskeletal: Negative for myalgias.  Skin: Positive for rash.  Neurological: Negative for headaches.  Psychiatric/Behavioral: Positive for decreased concentration. Negative for confusion.    Physical Exam Updated Vital Signs BP (!) 139/59 (BP Location: Right Wrist)   Pulse 96   Temp 98.5 F (36.9 C) (Oral)   Resp 18   Ht 5' 5"  (1.651 m)   Wt 133.4 kg   SpO2 90%  BMI 48.92 kg/m   Physical Exam Vitals and nursing note reviewed.  Constitutional:      Appearance: She is well-developed.  HENT:     Head: Normocephalic and atraumatic.     Nose: Nose normal.     Mouth/Throat:     Mouth: Mucous membranes are moist.  Eyes:     General:        Right eye: No discharge.        Left eye: No discharge.     Conjunctiva/sclera: Conjunctivae normal.  Cardiovascular:     Rate and Rhythm: Normal rate. Rhythm irregular.     Heart sounds: Normal heart sounds.  Pulmonary:     Effort: Pulmonary effort is normal.     Breath sounds: Normal breath sounds.  Abdominal:     Palpations: Abdomen is soft.     Tenderness: There is no abdominal tenderness.     Comments: Resolving cutaneous candidiasis of the lower abdominal wall creases.  It does appear improved from images provided with previous ED visit.  Musculoskeletal:     Cervical back: Normal range of motion and neck supple.     Comments: Left lower extremity: Trace edema, there is some scaling and several small scabs along the anterior aspect of the lower leg.  No erythema or signs of cellulitis.  Right lower extremity: Trace edema symmetric with left.  There is some scaling of the skin without any wounds.  No erythema or signs of cellulitis.  Patient with mild tenderness over the right patella.  Patella is mobile.  Skin:    General: Skin is warm and dry.  Neurological:     Mental Status: She is lethargic.     Cranial Nerves: Cranial nerves are intact.     Comments: Patient arouses to voice and answers questions  appropriately.  She does seem to doze off between questions if you are not actively engaging with her.  Gross motor and sensation intact.     ED Results / Procedures / Treatments   Labs (all labs ordered are listed, but only abnormal results are displayed) Labs Reviewed  COMPREHENSIVE METABOLIC PANEL - Abnormal; Notable for the following components:      Result Value   Sodium 133 (*)    Glucose, Bld 142 (*)    Calcium 8.8 (*)    Albumin 3.1 (*)    Alkaline Phosphatase 219 (*)    All other components within normal limits  CBC WITH DIFFERENTIAL/PLATELET - Abnormal; Notable for the following components:   RBC 3.48 (*)    Hemoglobin 9.0 (*)    HCT 30.1 (*)    MCH 25.9 (*)    MCHC 29.9 (*)    All other components within normal limits  BLOOD GAS, VENOUS - Abnormal; Notable for the following components:   pO2, Ven <31.0 (*)    All other components within normal limits  URINALYSIS, ROUTINE W REFLEX MICROSCOPIC - Abnormal; Notable for the following components:   APPearance HAZY (*)    Hgb urine dipstick SMALL (*)    Nitrite POSITIVE (*)    Leukocytes,Ua SMALL (*)    Bacteria, UA RARE (*)    All other components within normal limits  URINE CULTURE  AMMONIA  RAPID URINE DRUG SCREEN, HOSP PERFORMED  ETHANOL  CBG MONITORING, ED    ED ECG REPORT   Date: 06/07/2019  Rate: 105  Rhythm: atrial fibrillation  QRS Axis: normal  Intervals: normal  ST/T Wave abnormalities: nonspecific T wave changes  Conduction  Disutrbances:none  Narrative Interpretation:   Old EKG Reviewed: changed, now afib and faster from 12/10  I have personally reviewed the EKG tracing and agree with the computerized printout as noted.  Radiology DG Chest 2 View  Result Date: 06/07/2019 CLINICAL DATA:  [place] EXAM: CHEST - 2 VIEW COMPARISON:  05/30/2019 FINDINGS: Heart size remains enlarged. There is vascular congestion without signs of interstitial edema. No dense consolidation or sign of pleural effusion.  No acute bone finding. IMPRESSION: No active cardiopulmonary disease. Electronically Signed   By: Zetta Bills M.D.   On: 06/07/2019 20:05    Procedures Procedures (including critical care time)  Medications Ordered in ED Medications - No data to display  ED Course  I have reviewed the triage vital signs and the nursing notes.  Pertinent labs & imaging results that were available during my care of the patient were reviewed by me and considered in my medical decision making (see chart for details).  Patient seen and examined.  Patient reports being dehydrated however she does not have any significant symptoms which should cause her to be dehydrated.  My main concern is her mental status at this time and in that she is very drowsy.  O2 sat on arrival is 90%.  Will obtain work-up with labs, urine, chest x-ray.  Patient has had 2 previous head CTs in the past week and since she does not have any focal neurologic deficits, will not reimage at this time.  Vital signs reviewed and are as follows: BP (!) 139/59 (BP Location: Right Wrist)   Pulse 96   Temp 98.5 F (36.9 C) (Oral)   Resp 18   Ht 5' 5"  (1.651 m)   Wt 133.4 kg   SpO2 90%   BMI 48.92 kg/m   Patient discussed with Dr. Sedonia Small who has seen.   Patient has ambulated in the department without desaturation.  Upon further exam, she is sitting up on the side of the bed in no distress.  She states that her ride will be here soon.  We discussed that her lab work does not have any significant abnormalities today.  Urine sent prior to discharge which has nitrites and some white blood cells in it.  Will await results of culture.  Patient without signs of urosepsis or fevers.  If culture does show an infection, I would treat to see if this helps her generalized symptoms.  Encouraged return to the emergency department with fever, worsening symptoms, other concerns.    MDM Rules/Calculators/A&P                      Patient presents to the  emergency department today with complaint of dehydration stating that her mouth feels dry.  She has vague other complaints of weakness and overall tiredness.  She was just recently admitted for a Candida abdominal wall and pelvis infection.  This seems to be improving.  Her lab work today is without elevated white blood cell count.  She is anemic however this is at baseline.  Chest x-ray is clear.  Urine with questionable findings of an infection, culture pending.  Patient is ambulatory in the department without oxygen desaturation.  She is in no distress.  At this point, I do not feel that she would benefit from rehospitalization.  I do not suspect a significant infection.  I do not suspect stroke or other neurological problem at this point.  Her ammonia level is normal.  I encouraged her to follow-up  closely with her primary care doctor to make sure that her symptoms improve.  Return instructions with symptoms concerning for infection, or if she develops any worsening.  Final Clinical Impression(s) / ED Diagnoses Final diagnoses:  Weakness    Rx / DC Orders ED Discharge Orders    None       Carlisle Cater, PA-C 06/07/19 2331    Maudie Flakes, MD 06/11/19 [phone]

## 2019-06-07 NOTE — ED Triage Notes (Signed)
Patient brought in via EMS. Patient called because she stated she was dehydrated. Patient recently hospitalized for same. Patient alert and answers appropriately but dozes off between questions.

## 2019-06-07 NOTE — ED Notes (Signed)
Date and time results received: 06/07/19 6:30 PM  (use smartphrase ".now" to insert current time)  Test: pO2 Critical Value: <31.0  Name of Provider Notified: [place]  Orders Received? Or Actions Taken?: Orders Received - See Orders for details

## 2019-06-10 LAB — URINE CULTURE: Culture: >=100000 — AB

## 2019-06-11 ENCOUNTER — Telehealth: Payer: Self-pay

## 2019-06-11 NOTE — Telephone Encounter (Signed)
Post ED Visit - Positive Culture Follow-up: Unsuccessful Patient Follow-up  Culture assessed and recommendations reviewed by:  []  Elenor Quinones, Pharm.D. []  Heide Guile, Pharm.D., BCPS AQ-ID []  Parks Neptune, Pharm.D., BCPS []  Alycia Rossetti, Pharm.D., BCPS []  Lynd, Pharm.D., BCPS, AAHIVP []  Legrand Como, Pharm.D., BCPS, AAHIVP []  Wynell Balloon, PharmD []  Vincenza Hews, PharmD, BCPS Vallarie Mare Pharm D Positive urine culture Symptom check May need abx Cipro 250 mg BID x 5 days per Rodell Perna PA []  Patient discharged without antimicrobial prescription and treatment is now indicated []  Organism is resistant to prescribed ED discharge antimicrobial []  Patient with positive blood cultures   Unable to contact patient after 3 attempts, letter will be sent to address on file  Genia Del 06/11/2019, 12:20 PM

## 2019-06-20 ENCOUNTER — Inpatient Hospital Stay (HOSPITAL_COMMUNITY)
Admission: EM | Admit: 2019-06-20 | Discharge: 2019-06-29 | DRG: 641 | Disposition: A | Discharge status: Home / Self Care | Payer: PPO | Attending: Family Medicine | Admitting: Family Medicine

## 2019-06-20 ENCOUNTER — Other Ambulatory Visit: Payer: Self-pay

## 2019-06-20 ENCOUNTER — Encounter (HOSPITAL_COMMUNITY): Payer: Self-pay

## 2019-06-20 ENCOUNTER — Emergency Department (HOSPITAL_COMMUNITY): Payer: PPO

## 2019-06-20 DIAGNOSIS — E039 Hypothyroidism, unspecified: Secondary | ICD-10-CM | POA: Diagnosis present

## 2019-06-20 DIAGNOSIS — Z6841 Body Mass Index (BMI) 40.0 and over, adult: Secondary | ICD-10-CM | POA: Diagnosis not present

## 2019-06-20 DIAGNOSIS — I10 Essential (primary) hypertension: Secondary | ICD-10-CM | POA: Diagnosis not present

## 2019-06-20 DIAGNOSIS — Z7901 Long term (current) use of anticoagulants: Secondary | ICD-10-CM | POA: Diagnosis not present

## 2019-06-20 DIAGNOSIS — Z881 Allergy status to other antibiotic agents status: Secondary | ICD-10-CM

## 2019-06-20 DIAGNOSIS — R000 Tachycardia, unspecified: Secondary | ICD-10-CM | POA: Diagnosis not present

## 2019-06-20 DIAGNOSIS — F419 Anxiety disorder, unspecified: Secondary | ICD-10-CM | POA: Diagnosis not present

## 2019-06-20 DIAGNOSIS — I482 Chronic atrial fibrillation, unspecified: Secondary | ICD-10-CM | POA: Diagnosis not present

## 2019-06-20 DIAGNOSIS — K7581 Nonalcoholic steatohepatitis (NASH): Secondary | ICD-10-CM | POA: Diagnosis present

## 2019-06-20 DIAGNOSIS — R0902 Hypoxemia: Secondary | ICD-10-CM | POA: Diagnosis not present

## 2019-06-20 DIAGNOSIS — M1990 Unspecified osteoarthritis, unspecified site: Secondary | ICD-10-CM | POA: Diagnosis not present

## 2019-06-20 DIAGNOSIS — E876 Hypokalemia: Principal | ICD-10-CM | POA: Diagnosis present

## 2019-06-20 DIAGNOSIS — Z20822 Contact with and (suspected) exposure to covid-19: Secondary | ICD-10-CM | POA: Diagnosis present

## 2019-06-20 DIAGNOSIS — Z79890 Hormone replacement therapy (postmenopausal): Secondary | ICD-10-CM | POA: Diagnosis not present

## 2019-06-20 DIAGNOSIS — Z79891 Long term (current) use of opiate analgesic: Secondary | ICD-10-CM

## 2019-06-20 DIAGNOSIS — E119 Type 2 diabetes mellitus without complications: Secondary | ICD-10-CM | POA: Diagnosis not present

## 2019-06-20 DIAGNOSIS — N390 Urinary tract infection, site not specified: Secondary | ICD-10-CM | POA: Diagnosis present

## 2019-06-20 DIAGNOSIS — J449 Chronic obstructive pulmonary disease, unspecified: Secondary | ICD-10-CM | POA: Diagnosis present

## 2019-06-20 DIAGNOSIS — R52 Pain, unspecified: Secondary | ICD-10-CM | POA: Diagnosis not present

## 2019-06-20 DIAGNOSIS — Z825 Family history of asthma and other chronic lower respiratory diseases: Secondary | ICD-10-CM

## 2019-06-20 DIAGNOSIS — Z85850 Personal history of malignant neoplasm of thyroid: Secondary | ICD-10-CM | POA: Diagnosis not present

## 2019-06-20 DIAGNOSIS — Z888 Allergy status to other drugs, medicaments and biological substances status: Secondary | ICD-10-CM

## 2019-06-20 DIAGNOSIS — Z743 Need for continuous supervision: Secondary | ICD-10-CM | POA: Diagnosis not present

## 2019-06-20 DIAGNOSIS — Z9049 Acquired absence of other specified parts of digestive tract: Secondary | ICD-10-CM | POA: Diagnosis not present

## 2019-06-20 DIAGNOSIS — K7460 Unspecified cirrhosis of liver: Secondary | ICD-10-CM | POA: Diagnosis present

## 2019-06-20 DIAGNOSIS — B965 Pseudomonas (aeruginosa) (mallei) (pseudomallei) as the cause of diseases classified elsewhere: Secondary | ICD-10-CM | POA: Diagnosis not present

## 2019-06-20 DIAGNOSIS — Z833 Family history of diabetes mellitus: Secondary | ICD-10-CM

## 2019-06-20 DIAGNOSIS — Z794 Long term (current) use of insulin: Secondary | ICD-10-CM | POA: Diagnosis not present

## 2019-06-20 DIAGNOSIS — D510 Vitamin B12 deficiency anemia due to intrinsic factor deficiency: Secondary | ICD-10-CM | POA: Diagnosis present

## 2019-06-20 DIAGNOSIS — Z79899 Other long term (current) drug therapy: Secondary | ICD-10-CM

## 2019-06-20 DIAGNOSIS — K219 Gastro-esophageal reflux disease without esophagitis: Secondary | ICD-10-CM | POA: Diagnosis not present

## 2019-06-20 DIAGNOSIS — F329 Major depressive disorder, single episode, unspecified: Secondary | ICD-10-CM | POA: Diagnosis present

## 2019-06-20 DIAGNOSIS — Z90710 Acquired absence of both cervix and uterus: Secondary | ICD-10-CM

## 2019-06-20 DIAGNOSIS — Z885 Allergy status to narcotic agent status: Secondary | ICD-10-CM

## 2019-06-20 DIAGNOSIS — R5381 Other malaise: Secondary | ICD-10-CM | POA: Diagnosis not present

## 2019-06-20 DIAGNOSIS — Z87440 Personal history of urinary (tract) infections: Secondary | ICD-10-CM | POA: Diagnosis not present

## 2019-06-20 DIAGNOSIS — L89322 Pressure ulcer of left buttock, stage 2: Secondary | ICD-10-CM | POA: Diagnosis present

## 2019-06-20 DIAGNOSIS — G4730 Sleep apnea, unspecified: Secondary | ICD-10-CM | POA: Diagnosis not present

## 2019-06-20 DIAGNOSIS — L89312 Pressure ulcer of right buttock, stage 2: Secondary | ICD-10-CM | POA: Diagnosis present

## 2019-06-20 DIAGNOSIS — E1129 Type 2 diabetes mellitus with other diabetic kidney complication: Secondary | ICD-10-CM

## 2019-06-20 DIAGNOSIS — N3000 Acute cystitis without hematuria: Secondary | ICD-10-CM | POA: Diagnosis not present

## 2019-06-20 DIAGNOSIS — J42 Unspecified chronic bronchitis: Secondary | ICD-10-CM

## 2019-06-20 DIAGNOSIS — I4891 Unspecified atrial fibrillation: Secondary | ICD-10-CM | POA: Diagnosis present

## 2019-06-20 DIAGNOSIS — I2510 Atherosclerotic heart disease of native coronary artery without angina pectoris: Secondary | ICD-10-CM | POA: Diagnosis present

## 2019-06-20 DIAGNOSIS — Z880 Allergy status to penicillin: Secondary | ICD-10-CM

## 2019-06-20 DIAGNOSIS — R531 Weakness: Secondary | ICD-10-CM | POA: Diagnosis not present

## 2019-06-20 DIAGNOSIS — Z751 Person awaiting admission to adequate facility elsewhere: Secondary | ICD-10-CM

## 2019-06-20 DIAGNOSIS — Z20828 Contact with and (suspected) exposure to other viral communicable diseases: Secondary | ICD-10-CM | POA: Diagnosis not present

## 2019-06-20 DIAGNOSIS — N3281 Overactive bladder: Secondary | ICD-10-CM | POA: Diagnosis present

## 2019-06-20 DIAGNOSIS — L89152 Pressure ulcer of sacral region, stage 2: Secondary | ICD-10-CM | POA: Diagnosis present

## 2019-06-20 DIAGNOSIS — Z791 Long term (current) use of non-steroidal anti-inflammatories (NSAID): Secondary | ICD-10-CM

## 2019-06-20 LAB — URINALYSIS, ROUTINE W REFLEX MICROSCOPIC
Bilirubin Urine: NEGATIVE
Glucose, UA: NEGATIVE mg/dL
Ketones, ur: NEGATIVE mg/dL
Nitrite: POSITIVE — AB
Protein, ur: NEGATIVE mg/dL
Specific Gravity, Urine: 1.006 (ref 1.005–1.030)
pH: 6 (ref 5.0–8.0)

## 2019-06-20 LAB — BASIC METABOLIC PANEL
Anion gap: 10 (ref 5–15)
BUN: 9 mg/dL (ref 8–23)
CO2: 23 mmol/L (ref 22–32)
Calcium: 8.2 mg/dL — ABNORMAL LOW (ref 8.9–10.3)
Chloride: 102 mmol/L (ref 98–111)
Creatinine, Ser: 0.95 mg/dL (ref 0.44–1.00)
GFR calc Af Amer: >60 mL/min (ref >60)
GFR calc non Af Amer: >60 mL/min (ref >60)
Glucose, Bld: 158 mg/dL — ABNORMAL HIGH (ref 70–99)
Potassium: 2.9 mmol/L — ABNORMAL LOW (ref 3.5–5.1)
Sodium: 135 mmol/L (ref 135–145)

## 2019-06-20 LAB — COMPREHENSIVE METABOLIC PANEL
ALT: 18 U/L (ref 0–44)
AST: 20 U/L (ref 15–41)
Albumin: 3.2 g/dL — ABNORMAL LOW (ref 3.5–5.0)
Alkaline Phosphatase: 189 U/L — ABNORMAL HIGH (ref 38–126)
Anion gap: 15 (ref 5–15)
BUN: 9 mg/dL (ref 8–23)
CO2: 22 mmol/L (ref 22–32)
Calcium: 9.1 mg/dL (ref 8.9–10.3)
Chloride: 96 mmol/L — ABNORMAL LOW (ref 98–111)
Creatinine, Ser: 0.99 mg/dL (ref 0.44–1.00)
GFR calc Af Amer: >60 mL/min (ref >60)
GFR calc non Af Amer: >60 mL/min (ref >60)
Glucose, Bld: 155 mg/dL — ABNORMAL HIGH (ref 70–99)
Potassium: 2.6 mmol/L — CL (ref 3.5–5.1)
Sodium: 133 mmol/L — ABNORMAL LOW (ref 135–145)
Total Bilirubin: 0.7 mg/dL (ref 0.3–1.2)
Total Protein: 7.8 g/dL (ref 6.5–8.1)

## 2019-06-20 LAB — CBC WITH DIFFERENTIAL/PLATELET
Abs Immature Granulocytes: 0.03 10*3/uL (ref 0.00–0.07)
Basophils Absolute: 0.1 10*3/uL (ref 0.0–0.1)
Basophils Relative: 1 %
Eosinophils Absolute: 0.2 10*3/uL (ref 0.0–0.5)
Eosinophils Relative: 3 %
HCT: 36.6 % (ref 36.0–46.0)
Hemoglobin: 11 g/dL — ABNORMAL LOW (ref 12.0–15.0)
Immature Granulocytes: 0 %
Lymphocytes Relative: 25 %
Lymphs Abs: 1.9 10*3/uL (ref 0.7–4.0)
MCH: 25.9 pg — ABNORMAL LOW (ref 26.0–34.0)
MCHC: 30.1 g/dL (ref 30.0–36.0)
MCV: 86.3 fL (ref 80.0–100.0)
Monocytes Absolute: 0.5 10*3/uL (ref 0.1–1.0)
Monocytes Relative: 6 %
Neutro Abs: 5 10*3/uL (ref 1.7–7.7)
Neutrophils Relative %: 65 %
Platelets: 208 10*3/uL (ref 150–400)
RBC: 4.24 MIL/uL (ref 3.87–5.11)
RDW: 15.9 % — ABNORMAL HIGH (ref 11.5–15.5)
WBC: 7.7 10*3/uL (ref 4.0–10.5)
nRBC: 0 % (ref 0.0–0.2)

## 2019-06-20 LAB — MRSA PCR SCREENING: MRSA by PCR: NEGATIVE

## 2019-06-20 LAB — MAGNESIUM: Magnesium: 1.4 mg/dL — ABNORMAL LOW (ref 1.7–2.4)

## 2019-06-20 MED ORDER — POTASSIUM CHLORIDE 10 MEQ/100ML IV SOLN
10.0000 meq | Freq: Once | INTRAVENOUS | Status: AC
  Administered 2019-06-20: 10 meq via INTRAVENOUS
  Filled 2019-06-20: qty 100

## 2019-06-20 MED ORDER — PRAVASTATIN SODIUM 20 MG PO TABS
20.0000 mg | ORAL_TABLET | Freq: Every day | ORAL | Status: DC
  Filled 2019-06-20: qty 1

## 2019-06-20 MED ORDER — ALBUTEROL SULFATE (2.5 MG/3ML) 0.083% IN NEBU: 2.5000 mg | INHALATION_SOLUTION | RESPIRATORY_TRACT | Status: DC | PRN

## 2019-06-20 MED ORDER — HYDROXYZINE HCL 25 MG PO TABS
25.0000 mg | ORAL_TABLET | Freq: Four times a day (QID) | ORAL | Status: DC | PRN
  Administered 2019-06-21 – 2019-06-28 (×7): 25 mg via ORAL
  Filled 2019-06-20 (×7): qty 1

## 2019-06-20 MED ORDER — GABAPENTIN 300 MG PO CAPS
300.0000 mg | ORAL_CAPSULE | Freq: Every day | ORAL | Status: DC
  Administered 2019-06-20 – 2019-06-29 (×10): 300 mg via ORAL
  Filled 2019-06-20 (×10): qty 1

## 2019-06-20 MED ORDER — LEVOTHYROXINE SODIUM 75 MCG PO TABS
75.0000 ug | ORAL_TABLET | Freq: Every day | ORAL | Status: DC
  Administered 2019-06-21 – 2019-06-29 (×9): 75 ug via ORAL
  Filled 2019-06-20 (×9): qty 1

## 2019-06-20 MED ORDER — LEVOTHYROXINE SODIUM 100 MCG PO TABS
200.0000 ug | ORAL_TABLET | Freq: Every day | ORAL | Status: DC
  Administered 2019-06-21 – 2019-06-29 (×9): 200 ug via ORAL
  Filled 2019-06-20 (×9): qty 2

## 2019-06-20 MED ORDER — TRAZODONE HCL 50 MG PO TABS
100.0000 mg | ORAL_TABLET | Freq: Every day | ORAL | Status: DC
  Administered 2019-06-20 – 2019-06-28 (×9): 100 mg via ORAL
  Filled 2019-06-20 (×9): qty 2

## 2019-06-20 MED ORDER — DILTIAZEM HCL ER BEADS 240 MG PO CP24
360.0000 mg | ORAL_CAPSULE | Freq: Every day | ORAL | Status: DC
  Administered 2019-06-21 – 2019-06-29 (×9): 360 mg via ORAL
  Filled 2019-06-20 (×21): qty 1

## 2019-06-20 MED ORDER — CIPROFLOXACIN IN D5W 400 MG/200ML IV SOLN
400.0000 mg | Freq: Once | INTRAVENOUS | Status: AC
  Administered 2019-06-20: 400 mg via INTRAVENOUS
  Filled 2019-06-20: qty 200

## 2019-06-20 MED ORDER — COLESEVELAM HCL 625 MG PO TABS
1875.0000 mg | ORAL_TABLET | Freq: Two times a day (BID) | ORAL | Status: DC
  Administered 2019-06-21 – 2019-06-29 (×17): 1875 mg via ORAL
  Filled 2019-06-20 (×22): qty 3

## 2019-06-20 MED ORDER — MAGNESIUM SULFATE 2 GM/50ML IV SOLN
2.0000 g | Freq: Once | INTRAVENOUS | Status: AC
  Administered 2019-06-20: 2 g via INTRAVENOUS
  Filled 2019-06-20: qty 50

## 2019-06-20 MED ORDER — INSULIN GLARGINE 100 UNIT/ML ~~LOC~~ SOLN
33.0000 [IU] | Freq: Every day | SUBCUTANEOUS | Status: DC
  Administered 2019-06-20 – 2019-06-28 (×9): 33 [IU] via SUBCUTANEOUS
  Filled 2019-06-20 (×12): qty 0.33

## 2019-06-20 MED ORDER — POTASSIUM CHLORIDE CRYS ER 20 MEQ PO TBCR
40.0000 meq | EXTENDED_RELEASE_TABLET | ORAL | Status: AC
  Administered 2019-06-20 (×2): 40 meq via ORAL

## 2019-06-20 MED ORDER — SODIUM CHLORIDE 0.9 % IV SOLN: 250.0000 mL | INTRAVENOUS | Status: DC | PRN

## 2019-06-20 MED ORDER — INSULIN ASPART 100 UNIT/ML ~~LOC~~ SOLN
0.0000 [IU] | Freq: Three times a day (TID) | SUBCUTANEOUS | Status: DC
  Administered 2019-06-21: 3 [IU] via SUBCUTANEOUS
  Administered 2019-06-21: 08:00:00 2 [IU] via SUBCUTANEOUS
  Administered 2019-06-21: 5 [IU] via SUBCUTANEOUS
  Administered 2019-06-22: 09:00:00 3 [IU] via SUBCUTANEOUS
  Administered 2019-06-22: 12:00:00 2 [IU] via SUBCUTANEOUS
  Administered 2019-06-22: 17:00:00 3 [IU] via SUBCUTANEOUS
  Administered 2019-06-23: 2 [IU] via SUBCUTANEOUS
  Administered 2019-06-23: 12:00:00 5 [IU] via SUBCUTANEOUS
  Administered 2019-06-23: 2 [IU] via SUBCUTANEOUS
  Administered 2019-06-24: 17:00:00 3 [IU] via SUBCUTANEOUS
  Administered 2019-06-24 – 2019-06-25 (×3): 2 [IU] via SUBCUTANEOUS
  Administered 2019-06-25 – 2019-06-27 (×6): 3 [IU] via SUBCUTANEOUS
  Administered 2019-06-27: 2 [IU] via SUBCUTANEOUS
  Administered 2019-06-27 – 2019-06-28 (×4): 3 [IU] via SUBCUTANEOUS
  Administered 2019-06-29: 12:00:00 2 [IU] via SUBCUTANEOUS
  Administered 2019-06-29: 08:00:00 5 [IU] via SUBCUTANEOUS

## 2019-06-20 MED ORDER — INSULIN ASPART 100 UNIT/ML ~~LOC~~ SOLN
0.0000 [IU] | Freq: Every day | SUBCUTANEOUS | Status: DC
  Administered 2019-06-20: 4 [IU] via SUBCUTANEOUS
  Administered 2019-06-27: 2 [IU] via SUBCUTANEOUS

## 2019-06-20 MED ORDER — TRAZODONE HCL 50 MG PO TABS: 50.0000 mg | ORAL_TABLET | Freq: Every evening | ORAL | Status: DC | PRN

## 2019-06-20 MED ORDER — PRAVASTATIN SODIUM 10 MG PO TABS
20.0000 mg | ORAL_TABLET | Freq: Every day | ORAL | Status: DC
  Administered 2019-06-20 – 2019-06-28 (×9): 20 mg via ORAL
  Filled 2019-06-20 (×10): qty 2

## 2019-06-20 MED ORDER — ACETAMINOPHEN 325 MG PO TABS: 650.0000 mg | ORAL_TABLET | Freq: Four times a day (QID) | ORAL | Status: DC | PRN

## 2019-06-20 MED ORDER — METOLAZONE 5 MG PO TABS
5.0000 mg | ORAL_TABLET | Freq: Every day | ORAL | Status: DC
  Administered 2019-06-20 – 2019-06-24 (×5): 5 mg via ORAL
  Filled 2019-06-20 (×7): qty 1

## 2019-06-20 MED ORDER — ONDANSETRON HCL 4 MG PO TABS: 4.0000 mg | ORAL_TABLET | Freq: Four times a day (QID) | ORAL | Status: DC | PRN

## 2019-06-20 MED ORDER — POTASSIUM CHLORIDE CRYS ER 20 MEQ PO TBCR
20.0000 meq | EXTENDED_RELEASE_TABLET | Freq: Two times a day (BID) | ORAL | Status: DC
  Administered 2019-06-20 – 2019-06-29 (×18): 20 meq via ORAL
  Filled 2019-06-20 (×19): qty 1

## 2019-06-20 MED ORDER — DULOXETINE HCL 60 MG PO CPEP
120.0000 mg | ORAL_CAPSULE | Freq: Every day | ORAL | Status: DC
  Administered 2019-06-20 – 2019-06-29 (×10): 120 mg via ORAL
  Filled 2019-06-20 (×10): qty 2

## 2019-06-20 MED ORDER — SODIUM CHLORIDE 0.9% FLUSH
3.0000 mL | Freq: Two times a day (BID) | INTRAVENOUS | Status: DC
  Administered 2019-06-20 – 2019-06-28 (×14): 3 mL via INTRAVENOUS

## 2019-06-20 MED ORDER — CHLORHEXIDINE GLUCONATE CLOTH 2 % EX PADS
6.0000 | MEDICATED_PAD | Freq: Every day | CUTANEOUS | Status: DC
  Administered 2019-06-20 – 2019-06-26 (×6): 6 via TOPICAL

## 2019-06-20 MED ORDER — CIPROFLOXACIN IN D5W 400 MG/200ML IV SOLN
400.0000 mg | Freq: Two times a day (BID) | INTRAVENOUS | Status: DC
  Administered 2019-06-20 – 2019-06-23 (×6): 400 mg via INTRAVENOUS
  Filled 2019-06-20 (×6): qty 200

## 2019-06-20 MED ORDER — APIXABAN 5 MG PO TABS
5.0000 mg | ORAL_TABLET | Freq: Two times a day (BID) | ORAL | Status: DC
  Administered 2019-06-20 – 2019-06-29 (×18): 5 mg via ORAL
  Filled 2019-06-20 (×18): qty 1

## 2019-06-20 MED ORDER — POTASSIUM CHLORIDE CRYS ER 20 MEQ PO TBCR
40.0000 meq | EXTENDED_RELEASE_TABLET | Freq: Once | ORAL | Status: AC
  Administered 2019-06-20: 40 meq via ORAL
  Filled 2019-06-20: qty 2

## 2019-06-20 MED ORDER — SODIUM CHLORIDE 0.9 % IV BOLUS
500.0000 mL | Freq: Once | INTRAVENOUS | Status: AC
  Administered 2019-06-20: 500 mL via INTRAVENOUS

## 2019-06-20 MED ORDER — ACETAMINOPHEN 650 MG RE SUPP: 650.0000 mg | Freq: Four times a day (QID) | RECTAL | Status: DC | PRN

## 2019-06-20 MED ORDER — HYDROCODONE-ACETAMINOPHEN 5-325 MG PO TABS: 1.0000 | ORAL_TABLET | Freq: Four times a day (QID) | ORAL | Status: DC | PRN

## 2019-06-20 MED ORDER — METHOCARBAMOL 500 MG PO TABS
500.0000 mg | ORAL_TABLET | Freq: Four times a day (QID) | ORAL | Status: DC | PRN
  Administered 2019-06-23 – 2019-06-24 (×2): 500 mg via ORAL
  Filled 2019-06-20 (×2): qty 1

## 2019-06-20 MED ORDER — ONDANSETRON HCL 4 MG/2ML IJ SOLN: 4.0000 mg | Freq: Four times a day (QID) | INTRAMUSCULAR | Status: DC | PRN

## 2019-06-20 MED ORDER — SODIUM CHLORIDE 0.9% FLUSH: 3.0000 mL | INTRAVENOUS | Status: DC | PRN

## 2019-06-20 MED ORDER — SODIUM CHLORIDE 0.9 % IV SOLN: INTRAVENOUS | Status: DC

## 2019-06-20 MED ORDER — INSULIN ASPART 100 UNIT/ML ~~LOC~~ SOLN
4.0000 [IU] | Freq: Three times a day (TID) | SUBCUTANEOUS | Status: DC
  Administered 2019-06-21 – 2019-06-29 (×26): 4 [IU] via SUBCUTANEOUS

## 2019-06-20 MED ORDER — POLYETHYLENE GLYCOL 3350 17 G PO PACK: 17.0000 g | PACK | Freq: Every day | ORAL | Status: DC | PRN

## 2019-06-20 MED ORDER — ALBUTEROL SULFATE HFA 108 (90 BASE) MCG/ACT IN AERS
2.0000 | INHALATION_SPRAY | Freq: Four times a day (QID) | RESPIRATORY_TRACT | Status: DC | PRN
  Filled 2019-06-20: qty 6.7

## 2019-06-20 NOTE — ED Triage Notes (Addendum)
Pt reports multiple pressure sores on buttocks since admitted 2 weeks ago.  Reports was here for dehydration and says still feels dehydrated.  Reports generalized weakness.   EMS says cbg was 168 and bp was 160/108, temp 97.2

## 2019-06-20 NOTE — Progress Notes (Signed)
Spoke with patient's neighbor who expresses concern over patient's living conditions. Neighbor states that the only way to describe her living situation is if you took the recliner that she "lives in" and placed it in the middle of the landfill. She mentioned that there are inches of trash all over her floor, bugs flying in the house and is unable to care for herself. Her husband passed several years ago, her son is deceased and to her knowledge her grandchildren are never involved. She has already contacted social services but has not heard anything back. Advised the neighbor that I would make documentation out of concern for her health.

## 2019-06-20 NOTE — ED Provider Notes (Signed)
[facility] EMERGENCY DEPARTMENT Provider Note   CSN: [id number] Arrival date & time: 06/20/19  0830     History Chief Complaint  Patient presents with  . Wound Infection    [name] [name] is a 62 y.o. female.  Patient brought in by EMS.  Patient with a complaint of weakness and fatigue feeling no better since her last admission.  Patient was admitted for cellulitis abdominal wall on December 10.  And seen back in the emergency department on December 18 for dehydration but discharged home.  Patient also had a component of acute kidney injury on admission on December 10.  Patient's primary care doctor is Dr. Riley Kill.  Patient also known to have decubitus ulcers.  She states that those wounds are getting worse that she cannot get any help at home currently does not have any set up for home health nurse actually arriving but apparently has been arranged.        Past Medical History:  Diagnosis Date  . Anemia   . Anxiety   . Arthritis   . Asthma   . Atrial fibrillation ([place])   . Cirrhosis of liver ([place])   . COPD (chronic obstructive pulmonary disease) ([place])   . Coronary artery disease   . Depression   . Edema   . Essential hypertension, benign   . GERD (gastroesophageal reflux disease)   . Hypothyroidism   . Morbid obesity ([place])   . Overactive bladder   . Sleep apnea    CPAP - not consistent  . Thyroid cancer ([place])   . Type 2 diabetes mellitus [facility])     Patient Active Problem List   Diagnosis Date Noted  . AKI (acute kidney injury) ([place]) 06/01/2019  . NASH (nonalcoholic steatohepatitis) 06/01/2019  . Pressure injury of skin 05/31/2019  . Candidiasis, intertrigo 05/31/2019  . Yeast infection 05/31/2019  . Fever 12/24/2018  . Hyponatremia 12/24/2018  . Abnormal liver function 12/24/2018  . Sepsis due to undetermined organism ([place]) 11/07/2018  . Atrial fibrillation ([place]) 11/07/2018  . COPD (chronic obstructive pulmonary disease) ([place]) 11/07/2018  . GERD  (gastroesophageal reflux disease) 11/07/2018  . Hypothyroidism 11/07/2018  . Sleep apnea 11/07/2018  . Hepatic cirrhosis ([place]) 09/01/2014  . Cirrhosis, nonalcoholic ([place]) 51/76/1607  . Incisional hernia 06/09/2014  . S/P complete repair of rotator cuff 07/24/2012  . Precordial pain 09/30/2010  . Essential hypertension, benign 09/30/2010  . Type 2 diabetes mellitus ([place]) 09/05/2008  . OBESITY-MORBID (>100') 09/05/2008  . ESOPHAGEAL REFLUX 09/05/2008  . EDEMA 09/05/2008    Past Surgical History:  Procedure Laterality Date  . "pump bumps"     bilateral heels--2000  . ABDOMINAL HYSTERECTOMY  1985  . CARDIAC CATHETERIZATION  2012   Dr. Lia Foyer told her nothing was wrong.  [name] [place]  . CHOLECYSTECTOMY  1996  . COLONOSCOPY WITH PROPOFOL N/A 04/17/2015   Procedure: COLONOSCOPY WITH PROPOFOL  at cecum at 0810; withdrawal time =15 minutes;  Surgeon: [name], MD;  Location: [facility];  Service: Endoscopy;  Laterality: N/A;  . Debridement of abdominal wound  2011  . ESOPHAGEAL DILATION N/A 04/17/2015   Procedure: ESOPHAGEAL DILATION WITH 56FR MALONEY DILATOR;  Surgeon: [name], MD;  Location: [facility];  Service: Endoscopy;  Laterality: N/A;  . ESOPHAGOGASTRODUODENOSCOPY (EGD) WITH PROPOFOL N/A 04/17/2015   Procedure: ESOPHAGOGASTRODUODENOSCOPY (EGD) WITH PROPOFOL;  Surgeon: [name], MD;  Location: [facility];  Service: Endoscopy;  Laterality: N/A;  . EXPLORATORY LAPAROTOMY  2003  .  FOOT SURGERY Bilateral 2000   Bone spur removed  . FOOT SURGERY    . INCISIONAL HERNIA REPAIR N/A 06/09/2014   Procedure: RECURRENT  INCISIONAL HERNIORRHAPHY WITH MESH;  Surgeon: [name], MD;  Location: [facility];  Service: General;  Laterality: N/A;  . INCISIONAL HERNIA REPAIR N/A 10/15/2014   Procedure: [place];  Surgeon: [name], MD;  Location: [place];  Service: General;  Laterality: N/A;  . INSERTION OF MESH N/A 06/09/2014   Procedure:  INSERTION OF MESH;  Surgeon: [name], MD;  Location: [facility];  Service: General;  Laterality: N/A;  . INSERTION OF MESH N/A 10/15/2014   Procedure: INSERTION OF MESH;  Surgeon: [name], MD;  Location: [place];  Service: General;  Laterality: N/A;  . POLYPECTOMY  04/17/2015   Procedure: POLYPECTOMY;  Surgeon: [name], MD;  Location: [facility];  Service: Endoscopy;;  . RESECTION DISTAL CLAVICAL  05/07/2012   Procedure: RESECTION DISTAL CLAVICAL;  Surgeon: [name], MD;  Location: [facility];  Service: Orthopedics;  Laterality: Right;  . SHOULDER OPEN ROTATOR CUFF REPAIR  05/07/2012   Procedure: ROTATOR CUFF REPAIR SHOULDER OPEN;  Surgeon: [name], MD;  Location: [facility];  Service: Orthopedics;  Laterality: Right;  . TEE WITHOUT CARDIOVERSION N/A 12/26/2018   Procedure: TRANSESOPHAGEAL ECHOCARDIOGRAM (TEE) WITH PROPOFOL;  Surgeon: Arnoldo Lenis, MD;  [facility];  Service: Endoscopy;  Laterality: N/A;  . THYROIDECTOMY  2009  . TONSILLECTOMY  1958  . UMBILICAL HERNIA REPAIR  2010     OB History   No obstetric history on file.     Family History  Problem Relation Age of Onset  . Heart disease Other   . Arthritis Other   . Cancer Other   . Asthma Other   . Diabetes Other   . Kidney disease Other     Social History   Tobacco Use  . Smoking status: Never Smoker  . Smokeless tobacco: Never Used  Substance Use Topics  . Alcohol use: No    Alcohol/week: 0.0 standard drinks  . Drug use: No    Home Medications Prior to Admission medications   Medication Sig Start Date End Date Taking? Authorizing Provider  albuterol (PROVENTIL HFA) 108 (90 BASE) MCG/ACT inhaler Inhale 2 puffs into the lungs every 6 (six) hours as needed for wheezing or shortness of breath. Shortness of breath    [provider]  apixaban (ELIQUIS) 5 MG TABS tablet Take 1 tablet (5 mg total) by mouth 2 (two) times daily. 11/19/18   Satira Sark, MD    colesevelam [facility]) 625 MG tablet Take 1,875 mg by mouth as directed. 3 tabs am 3 tabs pm    [provider]  diltiazem (TIAZAC) 360 MG 24 hr capsule TAKE ONE CAPSULE (360MG TOTAL) BY MOUTH DAILY Patient taking differently: Take 360 mg by mouth daily.  05/20/19   Strader, Fransisco Hertz, PA-C  DULoxetine (CYMBALTA) 60 MG capsule Take 120 mg by mouth daily.  03/20/15   [provider]  econazole nitrate 1 % cream Apply topically daily. Patient taking differently: Apply 1 application topically daily as needed (for rash irritation).  12/09/15   Tuchman, Richard C, DPM  gabapentin (NEURONTIN) 300 MG capsule Take 300 mg by mouth daily. 10/19/18   [provider]  HYDROcodone-acetaminophen (NORCO/VICODIN) 5-325 MG tablet Take 1 tablet by mouth every 4 (four) hours as needed. Patient taking differently: Take 1 tablet by mouth  every 4 (four) hours as needed for moderate pain or severe pain.  12/04/18   Lily Kocher, PA-C  hydrOXYzine (ATARAX/VISTARIL) 25 MG tablet Take 25 mg by mouth 4 (four) times daily as needed for itching.  10/12/18   [provider]  LANTUS SOLOSTAR 100 UNIT/ML Solostar Pen Inject 33 Units into the skin at bedtime.  09/21/18   [provider]  levothyroxine (SYNTHROID) 200 MCG tablet Take 200 mcg by mouth daily before breakfast. Take in addition to 75 mcg for a total of 275 mcg daily    [provider]  levothyroxine (SYNTHROID) 75 MCG tablet Take 1 tablet (75 mcg total) by mouth daily before breakfast. 06/02/19   Johnson, Clanford L, MD  lisinopril (ZESTRIL) 20 MG tablet Take 20 mg by mouth daily. 04/22/19   [provider]  meloxicam (MOBIC) 15 MG tablet Take 1 tablet (15 mg total) by mouth daily as needed for pain. 06/02/19   Johnson, Clanford L, MD  metFORMIN (GLUCOPHAGE) 1000 MG tablet Take 1,000 mg by mouth 2 (two) times daily with a meal.    [provider]  methocarbamol (ROBAXIN) 500 MG tablet Take 1 tablet (500  mg total) by mouth every 6 (six) hours as needed for muscle spasms. 12/26/18   Manuella Ghazi, Pratik D, DO  metolazone (ZAROXOLYN) 5 MG tablet Take 5 mg by mouth daily. 05/09/19   [provider]  nystatin (NYSTOP) 100000 UNIT/GM POWD Apply 100,000 g topically daily as needed (for rash-irritation). Rash 08/18/10   [provider]  nystatin cream (MYCOSTATIN) Apply to affected area 2 times daily 05/29/19   Elnora Morrison, MD  potassium chloride SA (K-DUR,KLOR-CON) 20 MEQ tablet Take 20 mEq by mouth 2 (two) times daily.  04/27/18   [provider]  pravastatin (PRAVACHOL) 20 MG tablet Take 20 mg by mouth daily. 10/30/18   [provider]  tolterodine (DETROL LA) 4 MG 24 hr capsule Take 4 mg by mouth 2 (two) times a day. 08/17/18   [provider]  traZODone (DESYREL) 100 MG tablet Take 100-200 mg by mouth at bedtime. 09/21/18   [provider]    Allergies    Doxycycline, Adhesive [tape], Biaxin [clarithromycin], Percocet [oxycodone-acetaminophen], Xarelto [rivaroxaban], Clarithromycin, Clindamycin/lincomycin, Neosporin [neomycin-polymyxin b gu], and Penicillins  Review of Systems   Review of Systems  Constitutional: Positive for fatigue. Negative for chills and fever.  HENT: Negative for congestion, rhinorrhea and sore throat.   Eyes: Negative for visual disturbance.  Respiratory: Negative for cough and shortness of breath.   Cardiovascular: Negative for chest pain and leg swelling.  Gastrointestinal: Negative for abdominal pain, diarrhea, nausea and vomiting.  Genitourinary: Negative for dysuria.  Musculoskeletal: Negative for back pain and neck pain.  Skin: Negative for rash.  Neurological: Negative for dizziness, light-headedness and headaches.  Hematological: Does not bruise/bleed easily.  Psychiatric/Behavioral: Negative for confusion.    Physical Exam Updated Vital Signs BP (!) 147/72 (BP Location: Left Arm)   Pulse 88   Temp 97.9 F (36.6 C)  (Oral)   Resp 18   Ht 1.651 m (5' 5" )   Wt 133.8 kg   SpO2 99%   BMI 49.09 kg/m   Physical Exam Vitals and nursing note reviewed.  Constitutional:      General: She is not in acute distress.    Appearance: She is well-developed.  HENT:     Head: Normocephalic and atraumatic.  Eyes:     Conjunctiva/sclera: Conjunctivae normal.     Pupils: Pupils  are equal, round, and reactive to light.  Cardiovascular:     Rate and Rhythm: Normal rate and regular rhythm.     Heart sounds: No murmur.  Pulmonary:     Effort: Pulmonary effort is normal. No respiratory distress.     Breath sounds: Normal breath sounds.  Abdominal:     Palpations: Abdomen is soft.     Tenderness: There is no abdominal tenderness.  Musculoskeletal:     Cervical back: Normal range of motion and neck supple.     Right lower leg: Edema present.     Left lower leg: Edema present.  Skin:    General: Skin is warm and dry.  Neurological:     General: No focal deficit present.     Mental Status: She is alert and oriented to person, place, and time.     Cranial Nerves: No cranial nerve deficit.     Sensory: No sensory deficit.     Motor: No weakness.     ED Results / Procedures / Treatments   Labs (all labs ordered are listed, but only abnormal results are displayed) Labs Reviewed  COMPREHENSIVE METABOLIC PANEL - Abnormal; Notable for the following components:      Result Value   Sodium 133 (*)    Potassium 2.6 (*)    Chloride 96 (*)    Glucose, Bld 155 (*)    Albumin 3.2 (*)    Alkaline Phosphatase 189 (*)    All other components within normal limits  CBC WITH DIFFERENTIAL/PLATELET - Abnormal; Notable for the following components:   Hemoglobin 11.0 (*)    MCH 25.9 (*)    RDW 15.9 (*)    All other components within normal limits  URINALYSIS, ROUTINE W REFLEX MICROSCOPIC - Abnormal; Notable for the following components:   Hgb urine dipstick SMALL (*)    Nitrite POSITIVE (*)    Leukocytes,Ua TRACE (*)     Bacteria, UA MANY (*)    All other components within normal limits  URINE CULTURE    EKG EKG Interpretation  Date/Time:  Thursday June 20 2019 09:57:35 EST Ventricular Rate:  118 PR Interval:    QRS Duration: 92 QT Interval:  306 QTC Calculation: 429 R Axis:   80 Text Interpretation: Sinus tachycardia Supraventricular bigeminy Borderline repolarization abnormality Confirmed by Fredia Sorrow [phone]) on 06/20/2019 10:13:19 AM   Radiology DG Chest Port 1 View  Result Date: 06/20/2019 CLINICAL DATA:  Fatigue, weakness. EXAM: PORTABLE CHEST 1 VIEW COMPARISON:  Chest radiograph 06/07/2019 FINDINGS: Unchanged cardiomegaly. Aortic atherosclerosis. No airspace consolidation or appreciable pulmonary edema. No evidence of pleural effusion or pneumothorax. No acute bony abnormality. IMPRESSION: Cardiomegaly. No airspace consolidation or appreciable pulmonary edema. Electronically Signed   By: Kellie Simmering DO   On: 06/20/2019 09:55    Procedures Procedures (including critical care time)  CRITICAL CARE Performed by: Fredia Sorrow Total critical care time: 30 minutes Critical care time was exclusive of separately billable procedures and treating other patients. Critical care was necessary to treat or prevent imminent or life-threatening deterioration. Critical care was time spent personally by me on the following activities: development of treatment plan with patient and/or surrogate as well as nursing, discussions with consultants, evaluation of patient's response to treatment, examination of patient, obtaining history from patient or surrogate, ordering and performing treatments and interventions, ordering and review of laboratory studies, ordering and review of radiographic studies, pulse oximetry and re-evaluation of patient's condition.   Medications Ordered in ED Medications  0.9 %  sodium chloride infusion ( Intravenous Hold 06/20/19 1004)  potassium chloride 10 mEq in 100 mL  IVPB (has no administration in time range)  potassium chloride 10 mEq in 100 mL IVPB (has no administration in time range)  potassium chloride SA (KLOR-CON) CR tablet 40 mEq (has no administration in time range)  ciprofloxacin (CIPRO) IVPB 400 mg (has no administration in time range)  sodium chloride 0.9 % bolus 500 mL (500 mLs Intravenous New Bag/Given 06/20/19 1004)    ED Course  I have reviewed the triage vital signs and the nursing notes.  Pertinent labs & imaging results that were available during my care of the patient were reviewed by me and considered in my medical decision making (see chart for details).    MDM Rules/Calculators/A&P                      Patient work-up here significant for hypokalemia.  2.6.  Patient given 10 mg of potassium x2 IV and 40 [place] potassium p.o.  Also has evidence of acute cystitis without hematuria.  Urine culture sent patient treated with Cipro due to her allergies cannot have cephalosporins.  Discussed with hospitalist they will admit main reason for admission is the hypokalemia.  Also patient may need case management for assistance at home or either placement.     Final Clinical Impression(s) / ED Diagnoses Final diagnoses:  Hypokalemia  Acute cystitis without hematuria    Rx / DC Orders ED Discharge Orders    None       Fredia Sorrow, MD 06/20/19 1640

## 2019-06-20 NOTE — H&P (Signed)
Patient Demographics:    [name] [name], is a 63 y.o. female  MRN: [id number]   DOB - 31-Aug-1955  Admit Date - 06/20/2019  Outpatient Primary MD for the patient is Redmond School, MD   Assessment & Plan:    Active Problems:   Hypokalemia    1) severe hypokalemia/hypomagnesemia --- potassium 2.6, replace IV and po , magnesium down to 1.4 will replace and repeat as well -PTA patient was on metolazone  2) presumed UTI--- send urine culture with Pseudomonas sensitive to Cipro treat empirically with Cipro at this time  3)DM2-continue insulin regimen, Lantus 33 units nightly along with sliding scale coverage  4) generalized weakness and deconditioning--- poor living situation at home--- social work consult, PT consult -Apparently patient living situation is of concern to her neighbor that they have  called social services  5) decubitus ulcers and excoriated areas--- no evidence of significant superimposed cellulitis at this time--get wound care consult  6) hypothyroidism--continue atorvastatin 20-75 mcg daily  7) morbid obesity--- this complicates overall care  8) Chronic atrial fibrillation--continue Eliquis for anticoagulation and Cardizem CD 360 mg for rate control  With History of - Reviewed by me  Past Medical History:  Diagnosis Date  . Anemia   . Anxiety   . Arthritis   . Asthma   . Atrial fibrillation ([place])   . Cirrhosis of liver ([place])   . COPD (chronic obstructive pulmonary disease) ([place])   . Coronary artery disease   . Depression   . Edema   . Essential hypertension, benign   . GERD (gastroesophageal reflux disease)   . Hypothyroidism   . Morbid obesity ([place])   . Overactive bladder   . Sleep apnea    CPAP - not consistent  . Thyroid cancer ([place])   . Type 2 diabetes mellitus ([place])       Past Surgical History:  Procedure Laterality Date  . "pump bumps"     bilateral heels--2000  . ABDOMINAL HYSTERECTOMY  1985  . CARDIAC CATHETERIZATION  2012   Dr. Lia Foyer told her nothing was wrong.  [name] Alsey  . CHOLECYSTECTOMY  1996  . COLONOSCOPY WITH PROPOFOL N/A 04/17/2015   Procedure: COLONOSCOPY WITH PROPOFOL  at cecum at 0810; withdrawal time =15 minutes;  Surgeon: [name], MD;  Location: [facility];  Service: Endoscopy;  Laterality: N/A;  . Debridement of abdominal wound  2011  . ESOPHAGEAL DILATION N/A 04/17/2015   Procedure: ESOPHAGEAL DILATION WITH 56FR MALONEY DILATOR;  Surgeon: [name], MD;  Location: [facility];  Service: Endoscopy;  Laterality: N/A;  . ESOPHAGOGASTRODUODENOSCOPY (EGD) WITH PROPOFOL N/A 04/17/2015   Procedure: ESOPHAGOGASTRODUODENOSCOPY (EGD) WITH PROPOFOL;  Surgeon: [name], MD;  Location: [facility];  Service: Endoscopy;  Laterality: N/A;  . EXPLORATORY LAPAROTOMY  2003  . FOOT SURGERY Bilateral 2000   Bone spur removed  . FOOT SURGERY    . INCISIONAL HERNIA REPAIR N/A 06/09/2014  Procedure: RECURRENT  INCISIONAL HERNIORRHAPHY WITH MESH;  Surgeon: [name], MD;  Location: [facility];  Service: General;  Laterality: N/A;  . INCISIONAL HERNIA REPAIR N/A 10/15/2014   Procedure: [place];  Surgeon: [name], MD;  Location: [place];  Service: General;  Laterality: N/A;  . INSERTION OF MESH N/A 06/09/2014   Procedure: INSERTION OF MESH;  Surgeon: [name], MD;  Location: [facility];  Service: General;  Laterality: N/A;  . INSERTION OF MESH N/A 10/15/2014   Procedure: INSERTION OF MESH;  Surgeon: [name], MD;  Location: [place];  Service: General;  Laterality: N/A;  . POLYPECTOMY  04/17/2015   Procedure: POLYPECTOMY;  Surgeon: [name], MD;  Location: [facility];  Service: Endoscopy;;  . RESECTION DISTAL CLAVICAL  05/07/2012   Procedure: RESECTION DISTAL CLAVICAL;  Surgeon:  [name], MD;  Location: [facility];  Service: Orthopedics;  Laterality: Right;  . SHOULDER OPEN ROTATOR CUFF REPAIR  05/07/2012   Procedure: ROTATOR CUFF REPAIR SHOULDER OPEN;  Surgeon: [name], MD;  Location: [facility];  Service: Orthopedics;  Laterality: Right;  . TEE WITHOUT CARDIOVERSION N/A 12/26/2018   Procedure: TRANSESOPHAGEAL ECHOCARDIOGRAM (TEE) WITH PROPOFOL;  Surgeon: Arnoldo Lenis, MD;  [facility];  Service: Endoscopy;  Laterality: N/A;  . THYROIDECTOMY  2009  . TONSILLECTOMY  1958  . UMBILICAL HERNIA REPAIR  2010      Chief Complaint  Patient presents with  . Wound Infection      HPI:    [name] [name]  is a 63 y.o. female  with history of morbid obesity, atrial fibrillation on Eliquis, CAD, COPD, diabetes mellitus type 2 on insulin therapy, [name], history of thyroid cancer, hypothyroidism, hypertension recently discharged after treatment for cellulitis and Pseudomonas UTI presents to the ED with fatigue -She is found to have potassium of 2.6 and magnesium of 1.4 -Patient apparently had episode of diarrhea last week but currently no diarrhea, denies emesis, PTA she was on metolazone -No chest pains no palpitations, no increased shortness of breath -Apparently patient living situation is of concern to her neighbor that they have  called social services   Review of systems:    In addition to the HPI above,   A full Review of  Systems was done, all other systems reviewed are negative except as noted above in HPI , .    Social History:  Reviewed by me    Social History   Tobacco Use  . Smoking status: Never Smoker  . Smokeless tobacco: Never Used  Substance Use Topics  . Alcohol use: No    Alcohol/week: 0.0 standard drinks       Family History :  Reviewed by me    Family History  Problem Relation Age of Onset  . Heart disease Other   . Arthritis Other   . Cancer Other   . Asthma Other   . Diabetes Other   . Kidney  disease Other      Home Medications:   Prior to Admission medications   Medication Sig Start Date End Date Taking? Authorizing Provider  albuterol (PROVENTIL HFA) 108 (90 BASE) MCG/ACT inhaler Inhale 2 puffs into the lungs every 6 (six) hours as needed for wheezing or shortness of breath. Shortness of breath   Yes [provider]  apixaban (ELIQUIS) 5 MG TABS tablet Take 1 tablet (5 mg total) by mouth 2 (two) times daily. 11/19/18  Yes Satira Sark, MD  colesevelam (WELCHOL) 625 MG tablet Take 1,875 mg by mouth as directed. 3 tabs am 3 tabs pm   Yes [provider]  diltiazem (TIAZAC) 360 MG 24 hr capsule TAKE ONE CAPSULE (360MG TOTAL) BY MOUTH DAILY Patient taking differently: Take 360 mg by mouth daily.  05/20/19  Yes Strader, [place], PA-C  DULoxetine (CYMBALTA) 60 MG capsule Take 120 mg by mouth daily.  03/20/15  Yes [provider]  gabapentin (NEURONTIN) 300 MG capsule Take 300 mg by mouth daily. 10/19/18  Yes [provider]  HYDROcodone-acetaminophen (NORCO/VICODIN) 5-325 MG tablet Take 1 tablet by mouth every 4 (four) hours as needed. Patient taking differently: Take 1 tablet by mouth every 4 (four) hours as needed for moderate pain or severe pain.  12/04/18  Yes Lily Kocher, PA-C  hydrOXYzine (ATARAX/VISTARIL) 25 MG tablet Take 25 mg by mouth 4 (four) times daily as needed for itching.  10/12/18  Yes [provider]  LANTUS SOLOSTAR 100 UNIT/ML Solostar Pen Inject 33 Units into the skin at bedtime.  09/21/18  Yes [provider]  levothyroxine (SYNTHROID) 200 MCG tablet Take 200 mcg by mouth daily before breakfast. Take in addition to 75 mcg for a total of 275 mcg daily   Yes [provider]  levothyroxine (SYNTHROID) 75 MCG tablet Take 1 tablet (75 mcg total) by mouth daily before breakfast. 06/02/19  Yes Johnson, Clanford L, MD  lisinopril (ZESTRIL) 20 MG tablet Take 20 mg by mouth daily. 04/22/19  Yes [provider]  meloxicam (MOBIC) 15 MG tablet Take 1 tablet (15 mg total) by mouth daily as needed for pain. 06/02/19  Yes Johnson, Clanford L, MD  metFORMIN (GLUCOPHAGE) 1000 MG tablet Take 1,000 mg by mouth 2 (two) times daily with a meal.   Yes [provider]  metolazone (ZAROXOLYN) 5 MG tablet Take 5 mg by mouth daily. 05/09/19  Yes [provider]  nystatin (NYSTOP) 100000 UNIT/GM POWD Apply 100,000 g topically daily as needed (for rash-irritation). Rash 08/18/10  Yes [provider]  nystatin cream (MYCOSTATIN) Apply to affected area 2 times daily 05/29/19  Yes Elnora Morrison, MD  potassium chloride SA (K-DUR,KLOR-CON) 20 MEQ tablet Take 20 mEq by mouth 2 (two) times daily.  04/27/18  Yes [provider]  pravastatin (PRAVACHOL) 20 MG tablet Take 20 mg by mouth daily. 10/30/18  Yes [provider]  tolterodine (DETROL LA) 4 MG 24 hr capsule Take 4 mg by mouth 2 (two) times a day. 08/17/18  Yes [provider]  traZODone (DESYREL) 100 MG tablet Take 100-200 mg by mouth at bedtime. 09/21/18  Yes [provider]  econazole nitrate 1 % cream Apply topically daily. Patient taking differently: Apply 1 application topically daily as needed (for rash irritation).  12/09/15   Tuchman, Leslye Peer, DPM  methocarbamol (ROBAXIN) 500 MG tablet Take 1 tablet (500 mg total) by mouth every 6 (six) hours as needed for muscle spasms. Patient not taking: Reported on 06/20/2019 12/26/18   Heath Lark D, DO     Allergies:     Allergies  Allergen Reactions  . Doxycycline Itching  . Adhesive [Tape] Other (See Comments)    Blisters skin  . Biaxin [Clarithromycin] Other (See Comments)    Really bad yeast infection  . Percocet [Oxycodone-Acetaminophen] Itching  . Xarelto [Rivaroxaban] Itching  . Clarithromycin Rash    Yeast Infection  . Clindamycin/Lincomycin Rash    Yeast Infection   . Neosporin [Neomycin-Polymyxin B Gu] Rash  Eyes Drops only  .  Penicillins Rash    Has patient had a PCN reaction causing immediate rash, facial/tongue/throat swelling, SOB or lightheadedness with hypotension: Yes Has patient had a PCN reaction causing severe rash involving mucus membranes or skin necrosis: No Has patient had a PCN reaction that required hospitalization: No Has patient had a PCN reaction occurring within the last 10 years: No If all of the above answers are "NO", then may proceed with Cephalosporin use.      Physical Exam:   Vitals  Blood pressure (!) 144/83, pulse 80, temperature 98 F (36.7 C), temperature source Oral, resp. rate 17, height 5' 5"  (1.651 m), weight 118 kg, SpO2 98 %.  Physical Examination: General appearance - alert, morbidly obese, unkempt and in no distress Mental status - alert, oriented to person, place, and time,  Eyes - sclera anicteric Neck - supple, no JVD elevation , Chest - clear  to auscultation bilaterally, symmetrical air movement,  Heart - S1 and S2 normal, regular Abdomen - soft, nontender, nondistended, increased truncal adiposity  neurological - screening mental status exam normal, neck supple without rigidity, cranial nerves II through XII intact, DTR's normal and symmetric Extremities - no pedal edema noted, intact peripheral pulses  Skin -multiple areas of excoriation scratches, no frank cellulitis     Data Review:    CBC Recent Labs  Lab 06/20/19 1015  WBC 7.7  HGB 11.0*  HCT 36.6  PLT 208  MCV 86.3  MCH 25.9*  MCHC 30.1  RDW 15.9*  LYMPHSABS 1.9  MONOABS 0.5  EOSABS 0.2  BASOSABS 0.1   ------------------------------------------------------------------------------------------------------------------  Chemistries  Recent Labs  Lab 06/20/19 1004 06/20/19 1015 06/20/19 1407  NA  --  133* 135  K  --  2.6* 2.9*  CL  --  96* 102  CO2  --  22 23  GLUCOSE  --  155* 158*  BUN  --  9 9  CREATININE  --  0.99 0.95  CALCIUM  --  9.1 8.2*  MG 1.4*  --   --   AST  --  20   --   ALT  --  18  --   ALKPHOS  --  189*  --   BILITOT  --  0.7  --    ------------------------------------------------------------------------------------------------------------------ estimated creatinine clearance is 76.9 mL/min (by C-G formula based on SCr of 0.95 mg/dL). ------------------------------------------------------------------------------------------------------------------ No results for input(s): TSH, T4TOTAL, T3FREE, THYROIDAB in the last 72 hours.  Invalid input(s): FREET3   Coagulation profile No results for input(s): INR, PROTIME in the last 168 hours. ------------------------------------------------------------------------------------------------------------------- No results for input(s): DDIMER in the last 72 hours. -------------------------------------------------------------------------------------------------------------------  Cardiac Enzymes No results for input(s): CKMB, TROPONINI, MYOGLOBIN in the last 168 hours.  Invalid input(s): CK ------------------------------------------------------------------------------------------------------------------    Component Value Date/Time   BNP 25.0 05/30/2019 1827     ---------------------------------------------------------------------------------------------------------------  Urinalysis    Component Value Date/Time   COLORURINE YELLOW 06/20/2019 1004   APPEARANCEUR CLEAR 06/20/2019 1004   LABSPEC 1.006 06/20/2019 1004   PHURINE 6.0 06/20/2019 1004   GLUCOSEU NEGATIVE 06/20/2019 1004   HGBUR SMALL (A) 06/20/2019 1004   BILIRUBINUR NEGATIVE 06/20/2019 1004   KETONESUR NEGATIVE 06/20/2019 1004   PROTEINUR NEGATIVE 06/20/2019 1004   UROBILINOGEN 0.2 07/21/2014 2230   NITRITE POSITIVE (A) 06/20/2019 1004   LEUKOCYTESUR TRACE (A) 06/20/2019 1004    ----------------------------------------------------------------------------------------------------------------   Imaging Results:    DG Chest Port 1  View  Result Date: 06/20/2019 CLINICAL DATA:  Fatigue, weakness. EXAM: PORTABLE CHEST 1 VIEW COMPARISON:  Chest radiograph 06/07/2019 FINDINGS: Unchanged cardiomegaly. Aortic atherosclerosis. No airspace consolidation or appreciable pulmonary edema. No evidence of pleural effusion or pneumothorax. No acute bony abnormality. IMPRESSION: Cardiomegaly. No airspace consolidation or appreciable pulmonary edema. Electronically Signed   By: Kellie Simmering DO   On: 06/20/2019 09:55    Radiological Exams on Admission: DG Chest Port 1 View  Result Date: 06/20/2019 CLINICAL DATA:  Fatigue, weakness. EXAM: PORTABLE CHEST 1 VIEW COMPARISON:  Chest radiograph 06/07/2019 FINDINGS: Unchanged cardiomegaly. Aortic atherosclerosis. No airspace consolidation or appreciable pulmonary edema. No evidence of pleural effusion or pneumothorax. No acute bony abnormality. IMPRESSION: Cardiomegaly. No airspace consolidation or appreciable pulmonary edema. Electronically Signed   By: Kellie Simmering DO   On: 06/20/2019 09:55    DVT Prophylaxis -SCD  /eliquis AM Labs Ordered, also please review Full Orders  Family Communication: Admission, patients condition and plan of care including tests being ordered have been discussed with the patient  who indicate understanding and agree with the plan   Code Status - Full Code  Likely DC to  TBD  Condition   stable  Roxan Hockey M.D on 06/20/2019 at 7:59 PM Go to [email or web address] -  for contact info  [facility] - Office  [phone]

## 2019-06-20 NOTE — Progress Notes (Signed)
Pharmacy Antibiotic Note  [name] [name] is a 63 y.o. female admitted on 06/20/2019 with UTI.  Pharmacy has been consulted for Cipro dosing.  Patient has listed allergy to penicillins.   Plan: Cipro 400 mg IV every 12 hours. Monitor labs, c/s, and patient improvement.  Height: 5' 5"  (165.1 cm) Weight: 295 lb (133.8 kg) IBW/kg (Calculated) : 57  Temp (24hrs), Avg:97.9 F (36.6 C), Min:97.9 F (36.6 C), Max:97.9 F (36.6 C)  Recent Labs  Lab 06/20/19 1015  WBC 7.7  CREATININE 0.99    Estimated Creatinine Clearance: 79.5 mL/min (by C-G formula based on SCr of 0.99 mg/dL).    Allergies  Allergen Reactions  . Doxycycline Itching  . Adhesive [Tape] Other (See Comments)    Blisters skin  . Biaxin [Clarithromycin] Other (See Comments)    Really bad yeast infection  . Percocet [Oxycodone-Acetaminophen] Itching  . Xarelto [Rivaroxaban] Itching  . Clarithromycin Rash    Yeast Infection  . Clindamycin/Lincomycin Rash    Yeast Infection   . Neosporin [Neomycin-Polymyxin B Gu] Rash    Eyes Drops only  . Penicillins Rash    Has patient had a PCN reaction causing immediate rash, facial/tongue/throat swelling, SOB or lightheadedness with hypotension: Yes Has patient had a PCN reaction causing severe rash involving mucus membranes or skin necrosis: No Has patient had a PCN reaction that required hospitalization: No Has patient had a PCN reaction occurring within the last 10 years: No If all of the above answers are "NO", then may proceed with Cephalosporin use.     Antimicrobials this admission: Cipro 12/31 >>     Dose adjustments this admission: N/A  Microbiology results: 12/31 UCx: pending 12/18 UCx: pseudomonas    Thank you for allowing pharmacy to be a part of this patient's care.  Ramond Craver 06/20/2019 1:46 PM

## 2019-06-20 NOTE — ED Notes (Signed)
CRITICAL VALUE ALERT  Critical Value:  Potassium 2.6  Date & Time Notied:  06/20/2019 1043  Provider Notified: Dr. [name]  Orders Received/Actions taken: None yet

## 2019-06-21 LAB — BASIC METABOLIC PANEL
Anion gap: 8 (ref 5–15)
BUN: 8 mg/dL (ref 8–23)
CO2: 25 mmol/L (ref 22–32)
Calcium: 8.3 mg/dL — ABNORMAL LOW (ref 8.9–10.3)
Chloride: 103 mmol/L (ref 98–111)
Creatinine, Ser: 0.77 mg/dL (ref 0.44–1.00)
GFR calc Af Amer: >60 mL/min (ref >60)
GFR calc non Af Amer: >60 mL/min (ref >60)
Glucose, Bld: 138 mg/dL — ABNORMAL HIGH (ref 70–99)
Potassium: 3.7 mmol/L (ref 3.5–5.1)
Sodium: 136 mmol/L (ref 135–145)

## 2019-06-21 LAB — GLUCOSE, CAPILLARY
Glucose-Capillary: 340 mg/dL — ABNORMAL HIGH (ref 70–99)
Glucose-Capillary: 134 mg/dL — ABNORMAL HIGH (ref 70–99)
Glucose-Capillary: 212 mg/dL — ABNORMAL HIGH (ref 70–99)

## 2019-06-21 LAB — CBC
HCT: 30.1 % — ABNORMAL LOW (ref 36.0–46.0)
Hemoglobin: 8.9 g/dL — ABNORMAL LOW (ref 12.0–15.0)
MCH: 25.6 pg — ABNORMAL LOW (ref 26.0–34.0)
MCHC: 29.6 g/dL — ABNORMAL LOW (ref 30.0–36.0)
MCV: 86.7 fL (ref 80.0–100.0)
Platelets: 158 10*3/uL (ref 150–400)
RBC: 3.47 MIL/uL — ABNORMAL LOW (ref 3.87–5.11)
RDW: 15.8 % — ABNORMAL HIGH (ref 11.5–15.5)
WBC: 5.5 10*3/uL (ref 4.0–10.5)
nRBC: 0 % (ref 0.0–0.2)

## 2019-06-21 LAB — MAGNESIUM: Magnesium: 1.7 mg/dL (ref 1.7–2.4)

## 2019-06-21 LAB — SARS CORONAVIRUS 2 (TAT 6-24 HRS): SARS Coronavirus 2: NEGATIVE

## 2019-06-21 MED ORDER — MAGNESIUM SULFATE 2 GM/50ML IV SOLN
2.0000 g | Freq: Once | INTRAVENOUS | Status: AC
  Administered 2019-06-21: 2 g via INTRAVENOUS
  Filled 2019-06-21: qty 50

## 2019-06-21 MED ORDER — POTASSIUM CHLORIDE CRYS ER 20 MEQ PO TBCR
40.0000 meq | EXTENDED_RELEASE_TABLET | Freq: Once | ORAL | Status: AC
  Administered 2019-06-21: 09:00:00 40 meq via ORAL
  Filled 2019-06-21: qty 2

## 2019-06-21 NOTE — NC FL2 (Signed)
[place] MEDICAID FL2 LEVEL OF CARE SCREENING TOOL     IDENTIFICATION  Patient Name: [name] [name] Birthdate: 07/10/1955 Sex: female Admission Date (Current Location): 06/20/2019  [facility] and [place] Number:  [employer] and Address:  [place] [facility] [address] [place]      Provider Number: [id number]  Attending Physician Name and Address:  Roxan Hockey, MD  Relative Name and Phone Number:  Evonnie Dawes    Current Level of Care: Hospital Recommended Level of Care: [place] Prior Approval Number:    Date Approved/Denied:   PASRR Number: [id number]  Discharge Plan: SNF    Current Diagnoses: Patient Active Problem List   Diagnosis Date Noted  . Hypokalemia 06/20/2019  . AKI (acute kidney injury) ([place]) 06/01/2019  . NASH (nonalcoholic steatohepatitis) 06/01/2019  . Pressure injury of skin 05/31/2019  . Candidiasis, intertrigo 05/31/2019  . Yeast infection 05/31/2019  . Fever 12/24/2018  . Hyponatremia 12/24/2018  . Abnormal liver function 12/24/2018  . Sepsis due to undetermined organism ([place]) 11/07/2018  . Atrial fibrillation ([place]) 11/07/2018  . COPD (chronic obstructive pulmonary disease) ([place]) 11/07/2018  . GERD (gastroesophageal reflux disease) 11/07/2018  . Hypothyroidism 11/07/2018  . Sleep apnea 11/07/2018  . Hepatic cirrhosis ([place]) 09/01/2014  . Cirrhosis, nonalcoholic ([place]) 70/62/3762  . Incisional hernia 06/09/2014  . S/P complete repair of rotator cuff 07/24/2012  . Precordial pain 09/30/2010  . Essential hypertension, benign 09/30/2010  . Type 2 diabetes mellitus ([place]) 09/05/2008  . OBESITY-MORBID (>100') 09/05/2008  . ESOPHAGEAL REFLUX 09/05/2008  . EDEMA 09/05/2008    Orientation RESPIRATION BLADDER Height & Weight     Self, Time, Situation, Place  Normal Continent Weight: 118 kg Height:  5' 5"  (165.1 cm)  BEHAVIORAL SYMPTOMS/MOOD NEUROLOGICAL BOWEL NUTRITION STATUS   Continent Diet(see discharge summary)  AMBULATORY STATUS COMMUNICATION OF NEEDS Skin   Extensive Assist Verbally PU Stage and Appropriate Care, Skin abrasions, Bruising, Other (Comment)(stage 2 Ischial tuberosity/ Left toe deep tissue/ cellulitis - abdomen / generalized bruising and abrasions)                       Personal Care Assistance Level of Assistance  Bathing, Feeding, Dressing Bathing Assistance: Limited assistance Feeding assistance: Independent Dressing Assistance: Limited assistance     Functional Limitations Info  Sight, Hearing, Speech Sight Info: Adequate Hearing Info: Adequate Speech Info: Adequate    SPECIAL CARE FACTORS FREQUENCY  PT (By licensed PT)                    Contractures Contractures Info: Present    Additional Factors Info  Code Status, Allergies Code Status Info: 5 times a week Allergies Info: Clarithromycin , doxy, penicillin, neosporin, xarelto, percocet, adhesive tape, biaxin, clindamycin           Current Medications (06/21/2019):  This is the current hospital active medication list Current Facility-Administered Medications  Medication Dose Route Frequency Provider Last Rate Last Admin  . 0.9 %  sodium chloride infusion   Intravenous Continuous Roxan Hockey, MD 50 mL/hr at 06/21/19 0919 New Bag at 06/21/19 0919  . 0.9 %  sodium chloride infusion  250 mL Intravenous PRN Emokpae, Courage, MD      . acetaminophen (TYLENOL) tablet 650 mg  650 mg Oral Q6H PRN Emokpae, Courage, MD       Or  . acetaminophen (TYLENOL) suppository 650 mg  650 mg Rectal Q6H PRN Roxan Hockey, MD      .  albuterol (VENTOLIN HFA) 108 (90 Base) MCG/ACT inhaler 2 puff  2 puff Inhalation Q6H PRN Emokpae, Courage, MD      . apixaban (ELIQUIS) tablet 5 mg  5 mg Oral BID Roxan Hockey, MD   5 mg at 06/21/19 0923  . Chlorhexidine Gluconate Cloth 2 % PADS 6 each  6 each Topical Daily Roxan Hockey, MD   6 each at 06/21/19 0925  . ciprofloxacin (CIPRO)  IVPB 400 mg  400 mg Intravenous Q12H Emokpae, Courage, MD 200 mL/hr at 06/21/19 1126 400 mg at 06/21/19 1126  . colesevelam [facility]) tablet 1,875 mg  1,875 mg Oral BID Roxan Hockey, MD   1,875 mg at 06/21/19 0921  . diltiazem (TIAZAC) 24 hr capsule 360 mg  360 mg Oral Daily Emokpae, Courage, MD   360 mg at 06/21/19 0922  . DULoxetine (CYMBALTA) DR capsule 120 mg  120 mg Oral Daily Emokpae, Courage, MD   120 mg at 06/21/19 0921  . gabapentin (NEURONTIN) capsule 300 mg  300 mg Oral Daily Emokpae, Courage, MD   300 mg at 06/21/19 0922  . HYDROcodone-acetaminophen (NORCO/VICODIN) 5-325 MG per tablet 1 tablet  1 tablet Oral Q6H PRN Emokpae, Courage, MD      . hydrOXYzine (ATARAX/VISTARIL) tablet 25 mg  25 mg Oral QID PRN Roxan Hockey, MD   25 mg at 06/21/19 0921  . insulin aspart (novoLOG) injection 0-15 Units  0-15 Units Subcutaneous TID WC Emokpae, Courage, MD   5 Units at 06/21/19 1124  . insulin aspart (novoLOG) injection 0-5 Units  0-5 Units Subcutaneous QHS Roxan Hockey, MD   4 Units at 06/20/19 2201  . insulin aspart (novoLOG) injection 4 Units  4 Units Subcutaneous TID WC Emokpae, Courage, MD   4 Units at 06/21/19 1124  . insulin glargine (LANTUS) injection 33 Units  33 Units Subcutaneous QHS Roxan Hockey, MD   33 Units at 06/20/19 2212  . levothyroxine (SYNTHROID) tablet 200 mcg  200 mcg Oral QAC breakfast Denton Brick, Courage, MD   200 mcg at 06/21/19 0612  . levothyroxine (SYNTHROID) tablet 75 mcg  75 mcg Oral QAC breakfast Roxan Hockey, MD   75 mcg at 06/21/19 0612  . methocarbamol (ROBAXIN) tablet 500 mg  500 mg Oral Q6H PRN Emokpae, Courage, MD      . metolazone (ZAROXOLYN) tablet 5 mg  5 mg Oral Daily Emokpae, Courage, MD   5 mg at 06/21/19 0922  . ondansetron (ZOFRAN) tablet 4 mg  4 mg Oral Q6H PRN Emokpae, Courage, MD       Or  . ondansetron (ZOFRAN) injection 4 mg  4 mg Intravenous Q6H PRN Emokpae, Courage, MD      . polyethylene glycol (MIRALAX / GLYCOLAX) packet 17 g   17 g Oral Daily PRN Emokpae, Courage, MD      . potassium chloride SA (KLOR-CON) CR tablet 20 mEq  20 mEq Oral BID Denton Brick, Courage, MD   20 mEq at 06/21/19 0923  . pravastatin (PRAVACHOL) tablet 20 mg  20 mg Oral q1800 Emokpae, Courage, MD   20 mg at 06/20/19 1700  . sodium chloride flush (NS) 0.9 % injection 3 mL  3 mL Intravenous Q12H Emokpae, Courage, MD   3 mL at 06/21/19 0925  . sodium chloride flush (NS) 0.9 % injection 3 mL  3 mL Intravenous PRN Emokpae, Courage, MD      . traZODone (DESYREL) tablet 100 mg  100 mg Oral QHS Emokpae, Courage, MD   100 mg at 06/20/19 2201  Discharge Medications: Please see discharge summary for a list of discharge medications.  Relevant Imaging Results:  Relevant Lab Results:   Additional Information SS# [id number]  Boneta Lucks, RN

## 2019-06-21 NOTE — Care Management Important Message (Signed)
Important Message  Patient Details  Name: [name] [name] MRN: [id number] Date of Birth: Nov 17, 1955   Medicare Important Message Given:  Yes     Tommy Medal 06/21/2019, 12:07 PM
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
## 2019-06-21 NOTE — TOC Initial Note (Signed)
Transition of Care [facility]) - Initial/Assessment Note    Patient Details  Name: [name] [name] MRN: [id number] Date of Birth: 07-31-1955  Transition of Care [facility]) CM/SW Contact:    Boneta Lucks, RN Phone Number: 06/21/2019, 5:03 PM  Clinical Narrative:       Patient admitted for hypokalemia, Patient has a high risk of readmission. Patient lives home alone. Per RN a friend has concerns with patients living conditions.  DDS closed today.  TOC will follow up on Monday.  PT is recommending SNF. Patient is in agreement.  CM discuss SNF and rating. Patient was [facility], [facility], [facility] or [facility]. FL2 completed and sent out for review. TOC to follow.              Expected Discharge Plan: Skilled Nursing Facility Barriers to Discharge: Continued Medical Work up   Patient Goals and CMS Choice Patient states their goals for this hospitalization and ongoing recovery are:: to return home after SNF. CMS Medicare.gov Compare Post Acute Care list provided to:: Patient Choice offered to / list presented to : Patient  Expected Discharge Plan and Services Expected Discharge Plan: [place]    Prior Living Arrangements/Services   Lives with:: Self          Activities of Daily Living Home Assistive Devices/Equipment: Bedside commode/3-in-1, Environmental consultant (specify type), Cane (specify quad or straight), Eyeglasses ADL Screening (condition at time of admission) Patient's cognitive ability adequate to safely complete daily activities?: Yes Is the patient deaf or have difficulty hearing?: No Does the patient have difficulty seeing, even when wearing glasses/contacts?: No Does the patient have difficulty concentrating, remembering, or making decisions?: No Patient able to express need for assistance with ADLs?: Yes Does the patient have difficulty dressing or bathing?: Yes Independently performs ADLs?: No Communication: Independent with device (comment) Dressing (OT): Independent with device  (comment) Grooming: Independent with device (comment) Feeding: Independent Bathing: Independent with device (comment) Toileting: Independent with device (comment) In/Out Bed: Independent Walks in Home: Independent with device (comment) Does the patient have difficulty walking or climbing stairs?: Yes Weakness of Legs: Both Weakness of Arms/Hands: None  Permission Sought/Granted        Orientation: : Oriented to Self, Oriented to Place, Oriented to  Time, Oriented to Situation Alcohol / Substance Use: Not Applicable Psych Involvement: No (comment)  Admission diagnosis:  Hypokalemia [E87.6] Acute cystitis without hematuria [N30.00] Patient Active Problem List   Diagnosis Date Noted  . Hypokalemia 06/20/2019  . AKI (acute kidney injury) ([place]) 06/01/2019  . NASH (nonalcoholic steatohepatitis) 06/01/2019  . Pressure injury of skin 05/31/2019  . Candidiasis, intertrigo 05/31/2019  . Yeast infection 05/31/2019  . Fever 12/24/2018  . Hyponatremia 12/24/2018  . Abnormal liver function 12/24/2018  . Sepsis due to undetermined organism ([place]) 11/07/2018  . Atrial fibrillation ([place]) 11/07/2018  . COPD (chronic obstructive pulmonary disease) ([place]) 11/07/2018  . GERD (gastroesophageal reflux disease) 11/07/2018  . Hypothyroidism 11/07/2018  . Sleep apnea 11/07/2018  . Hepatic cirrhosis ([place]) 09/01/2014  . Cirrhosis, nonalcoholic ([place]) 55/73/2202  . Incisional hernia 06/09/2014  . S/P complete repair of rotator cuff 07/24/2012  . Precordial pain 09/30/2010  . Essential hypertension, benign 09/30/2010  . Type 2 diabetes mellitus ([place]) 09/05/2008  . OBESITY-MORBID (>100') 09/05/2008  . ESOPHAGEAL REFLUX 09/05/2008  . EDEMA 09/05/2008   PCP:  Redmond School, MD Pharmacy:   [facility] [place] [address] Phone: [phone] Fax: [phone]  Readmission Risk Interventions Readmission Risk Prevention Plan 06/21/2019   Transportation Screening Not Complete  Medication Review [employer]) Complete  PCP or Specialist appointment within 3-5 days of discharge Not Complete  HRI or Home Care Consult Complete  SW Recovery Care/Counseling Consult Complete  Palliative Care Screening Not Complete  Skilled Nursing Facility Complete  Some recent data might be hidden

## 2019-06-21 NOTE — Plan of Care (Signed)
  Problem: Acute Rehab PT Goals(only PT should resolve) Goal: Patient Will Transfer Sit To/From Stand Outcome: Progressing Flowsheets (Taken 06/21/2019 1006) Patient will transfer sit to/from stand: with supervision Goal: Pt Will Transfer Bed To Chair/Chair To Bed Outcome: Progressing Flowsheets (Taken 06/21/2019 1006) Pt will Transfer Bed to Chair/Chair to Bed: with supervision Goal: Pt Will Ambulate Outcome: Progressing Flowsheets (Taken 06/21/2019 1006) Pt will Ambulate:  75 feet  with rolling walker  10:07 AM, 06/21/19 Mearl Latin PT, DPT Physical Therapist at [facility]

## 2019-06-21 NOTE — Evaluation (Signed)
Physical Therapy Evaluation Patient Details Name: [name] [name] MRN: [id number] DOB: 02/07/1956 Today's Date: 06/21/2019   History of Present Illness  [name] [name]  is a 64 y.o. female  with history of morbid obesity, atrial fibrillation on Eliquis, CAD, COPD, diabetes mellitus type 2 on insulin therapy, [name], history of thyroid cancer, hypothyroidism, hypertension recently discharged after treatment for cellulitis and Pseudomonas UTI presents to the ED with fatigue    Clinical Impression  Patient limited for functional mobility as stated below secondary to BLE weakness, fatigue and poor standing balance. She requires assist with transfers and ambulation and is most limited by fatigue, weakness, and impaired activity tolerance. Patient requires min assist to transfer to standing and min assist with RW to ambulate into hallway and return to bed. Patient has good sitting balance seated EOB but fair with use of RW secondary to c/o dizziness and leg weakness. Patient will benefit from continued physical therapy in hospital and recommended venue below to increase strength, balance, endurance for safe ADLs and gait.     Follow Up Recommendations SNF    Equipment Recommendations  None recommended by PT    Recommendations for Other Services       Precautions / Restrictions Precautions Precautions: Fall Restrictions Weight Bearing Restrictions: No      Mobility  Bed Mobility Overal bed mobility: Needs Assistance Bed Mobility: Supine to Sit     Supine to sit: Supervision     General bed mobility comments: increased time, labored movement  Transfers Overall transfer level: Needs assistance Equipment used: Rolling walker (2 wheeled) Transfers: Sit to/from [facility] Sit to Stand: Min guard;Min assist Stand pivot transfers: Min guard;Min assist       General transfer comment: using RW and assist  Ambulation/Gait Ambulation/Gait assistance: Min guard;Min  assist Gait Distance (Feet): 20 Feet Assistive device: Rolling walker (2 wheeled) Gait Pattern/deviations: Decreased step length - right;Decreased step length - left;Decreased stride length;Step-through pattern Gait velocity: decreased   General Gait Details: slightly labored cadence without loss of balance, limited secondary to fatigue and dizziness  Stairs            Wheelchair Mobility    Modified Rankin (Stroke Patients Only)       Balance Overall balance assessment: Needs assistance Sitting-balance support: Feet supported;No upper extremity supported Sitting balance-Leahy Scale: Good Sitting balance - Comments: seated at EOB   Standing balance support: During functional activity;Bilateral upper extremity supported Standing balance-Leahy Scale: Fair Standing balance comment: using RW                             Pertinent Vitals/Pain Pain Assessment: No/denies pain    Home Living Family/patient expects to be discharged to:: Private residence Living Arrangements: Alone Available Help at Discharge: Family;Available PRN/intermittently Type of Home: House Home Access: Ramped entrance     Home Layout: One level Home Equipment: Walker - standard;Cane - quad;Wheelchair - manual;Walker - 2 wheels;Walker - 4 wheels;Bedside commode      Prior Function Level of Independence: Independent with assistive device(s);Needs assistance   Gait / Transfers Assistance Needed: uses assistive device  ADL's / Homemaking Assistance Needed: requires assistance  Comments: limited household ambulator with AD/ furniture walks     Hand Dominance   Dominant Hand: Right    Extremity/Trunk Assessment   Upper Extremity Assessment Upper Extremity Assessment: Generalized weakness    Lower Extremity Assessment Lower Extremity Assessment: Generalized weakness    Cervical /  Trunk Assessment Cervical / Trunk Assessment: Normal  Communication   Communication: No  difficulties  Cognition Arousal/Alertness: Awake/alert Behavior During Therapy: WFL for tasks assessed/performed Overall Cognitive Status: Within Functional Limits for tasks assessed                                        General Comments      Exercises     Assessment/Plan    PT Assessment Patient needs continued PT services  PT Problem List Decreased strength;Decreased activity tolerance;Decreased balance;Decreased mobility;Obesity       PT Treatment Interventions Gait training;Balance training;Functional mobility training;Therapeutic activities;Therapeutic exercise;Patient/family education;DME instruction    PT Goals (Current goals can be found in the Care Plan section)  Acute Rehab PT Goals Patient Stated Goal: go to rehab and return home PT Goal Formulation: With patient Time For Goal Achievement: 07/05/19 Potential to Achieve Goals: Fair    Frequency Min 3X/week   Barriers to discharge        Co-evaluation               AM-PAC PT "6 Clicks" Mobility  Outcome Measure Help needed turning from your back to your side while in a flat bed without using bedrails?: None Help needed moving from lying on your back to sitting on the side of a flat bed without using bedrails?: A Little Help needed moving to and from a bed to a chair (including a wheelchair)?: A Little Help needed standing up from a chair using your arms (e.g., wheelchair or bedside chair)?: A Little Help needed to walk in hospital room?: A Little Help needed climbing 3-5 steps with a railing? : A Lot 6 Click Score: 18    End of Session Equipment Utilized During Treatment: Gait belt Activity Tolerance: Patient tolerated treatment well;Patient limited by fatigue Patient left: in bed;with call bell/phone within reach Nurse Communication: Mobility status PT Visit Diagnosis: Unsteadiness on feet (R26.81);Other abnormalities of gait and mobility (R26.89);Muscle weakness (generalized)  (M62.81);History of falling (Z91.81)    Time: 8110-3159 PT Time Calculation (min) (ACUTE ONLY): 28 min   Charges:   PT Evaluation $PT Eval Moderate Complexity: 1 Mod PT Treatments $Therapeutic Activity: 23-37 mins        10:04 AM, 06/21/19 Mearl Latin PT, DPT Physical Therapist at [facility]

## 2019-06-22 DIAGNOSIS — N390 Urinary tract infection, site not specified: Secondary | ICD-10-CM | POA: Diagnosis present

## 2019-06-22 LAB — BASIC METABOLIC PANEL
Anion gap: 10 (ref 5–15)
BUN: 7 mg/dL — ABNORMAL LOW (ref 8–23)
CO2: 26 mmol/L (ref 22–32)
Calcium: 8.4 mg/dL — ABNORMAL LOW (ref 8.9–10.3)
Chloride: 101 mmol/L (ref 98–111)
Creatinine, Ser: 0.77 mg/dL (ref 0.44–1.00)
GFR calc Af Amer: >60 mL/min (ref >60)
GFR calc non Af Amer: >60 mL/min (ref >60)
Glucose, Bld: 152 mg/dL — ABNORMAL HIGH (ref 70–99)
Potassium: 3.9 mmol/L (ref 3.5–5.1)
Sodium: 137 mmol/L (ref 135–145)

## 2019-06-22 LAB — GLUCOSE, CAPILLARY
Glucose-Capillary: 164 mg/dL — ABNORMAL HIGH (ref 70–99)
Glucose-Capillary: 117 mg/dL — ABNORMAL HIGH (ref 70–99)
Glucose-Capillary: 190 mg/dL — ABNORMAL HIGH (ref 70–99)
Glucose-Capillary: 130 mg/dL — ABNORMAL HIGH (ref 70–99)
Glucose-Capillary: 169 mg/dL — ABNORMAL HIGH (ref 70–99)
Glucose-Capillary: 168 mg/dL — ABNORMAL HIGH (ref 70–99)

## 2019-06-22 LAB — CBC
HCT: 32 % — ABNORMAL LOW (ref 36.0–46.0)
Hemoglobin: 9.3 g/dL — ABNORMAL LOW (ref 12.0–15.0)
MCH: 25.8 pg — ABNORMAL LOW (ref 26.0–34.0)
MCHC: 29.1 g/dL — ABNORMAL LOW (ref 30.0–36.0)
MCV: 88.6 fL (ref 80.0–100.0)
Platelets: 155 10*3/uL (ref 150–400)
RBC: 3.61 MIL/uL — ABNORMAL LOW (ref 3.87–5.11)
RDW: 15.9 % — ABNORMAL HIGH (ref 11.5–15.5)
WBC: 5.9 10*3/uL (ref 4.0–10.5)
nRBC: 0 % (ref 0.0–0.2)

## 2019-06-22 LAB — URINE CULTURE: Culture: >=100000 — AB

## 2019-06-22 LAB — MAGNESIUM: Magnesium: 1.6 mg/dL — ABNORMAL LOW (ref 1.7–2.4)

## 2019-06-22 MED ORDER — CLOTRIMAZOLE 1 % EX CREA
TOPICAL_CREAM | Freq: Two times a day (BID) | CUTANEOUS | Status: DC
  Administered 2019-06-23 – 2019-06-24 (×2): 1 via TOPICAL
  Filled 2019-06-22: qty 15

## 2019-06-22 NOTE — Consult Note (Signed)
  Consultation completed by use of remote telehealth camera cart/Elink technology and with assistance from bedside nurse/clinical staff  Reason for Consult: wound Wound type: pressure injury  Pressure Injury POA: Yes Measurement: see nursing flow sheets Wound bed:  1. Left ischial tuberosity: Stage 2 Pressure Injury 2. Right ischial tuberosity: Stage 2 Pressure Injury Drainage (amount, consistency, odor) none Periwound: intact Dressing procedure/placement/frequency: Continue silicone foam per skin care order set. Turn and reposition; patient mobile in the bed, able to turn self Add antifungal cream; patent self reporting itching and burning associated with affected areas.   Discussed POC with patient and bedside nurse.  Re consult if needed, will not follow at this time. Thanks  Sedra Morfin [employer], RN,CWOCN, CNS, [place] [phone])
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
## 2019-06-23 LAB — GLUCOSE, CAPILLARY
Glucose-Capillary: 133 mg/dL — ABNORMAL HIGH (ref 70–99)
Glucose-Capillary: 223 mg/dL — ABNORMAL HIGH (ref 70–99)
Glucose-Capillary: 132 mg/dL — ABNORMAL HIGH (ref 70–99)
Glucose-Capillary: 143 mg/dL — ABNORMAL HIGH (ref 70–99)

## 2019-06-23 MED ORDER — CIPROFLOXACIN HCL 250 MG PO TABS
500.0000 mg | ORAL_TABLET | Freq: Two times a day (BID) | ORAL | Status: AC
  Administered 2019-06-23 – 2019-06-25 (×5): 500 mg via ORAL
  Filled 2019-06-23 (×5): qty 2

## 2019-06-23 MED ORDER — MAGNESIUM SULFATE 2 GM/50ML IV SOLN
2.0000 g | Freq: Once | INTRAVENOUS | Status: AC
  Administered 2019-06-23: 2 g via INTRAVENOUS
  Filled 2019-06-23: qty 50
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
## 2019-06-24 LAB — GLUCOSE, CAPILLARY
Glucose-Capillary: 147 mg/dL — ABNORMAL HIGH (ref 70–99)
Glucose-Capillary: 133 mg/dL — ABNORMAL HIGH (ref 70–99)
Glucose-Capillary: 151 mg/dL — ABNORMAL HIGH (ref 70–99)
Glucose-Capillary: 154 mg/dL — ABNORMAL HIGH (ref 70–99)

## 2019-06-24 LAB — MAGNESIUM: Magnesium: 1.5 mg/dL — ABNORMAL LOW (ref 1.7–2.4)

## 2019-06-24 MED ORDER — MAGNESIUM SULFATE 4 GM/100ML IV SOLN
4.0000 g | Freq: Once | INTRAVENOUS | Status: AC
  Administered 2019-06-24: 4 g via INTRAVENOUS
  Filled 2019-06-24: qty 100

## 2019-06-24 MED ORDER — METOLAZONE 5 MG PO TABS: 2.5000 mg | ORAL_TABLET | Freq: Every day | ORAL | Status: DC

## 2019-06-24 NOTE — Progress Notes (Signed)
Physical Therapy Treatment Patient Details Name: [name] MRN: [id number] DOB: 1956-04-01 Today's Date: 06/24/2019    History of Present Illness [name] [name]  is a 64 y.o. female  with history of morbid obesity, atrial fibrillation on Eliquis, CAD, COPD, diabetes mellitus type 2 on insulin therapy, [name], history of thyroid cancer, hypothyroidism, hypertension recently discharged after treatment for cellulitis and Pseudomonas UTI presents to the ED with fatigue    PT Comments    Patient tolerates seated exercises EOB well and is educated on completing them throughout the day while in hospital. She is able to transfer to standing with min guard assist for safety and requires min verbal cueing for using RW. She is able to ambulate increased distance today before she fatigues. She ambulates well with RW without loss of balance with slow, labored cadence. Patient shows improved activity tolerance today but continues to remain limited. Patient returns to room - RN notified. Patient will benefit from continued physical therapy in hospital and recommended venue below to increase strength, balance, endurance for safe ADLs and gait.    Follow Up Recommendations  SNF     Equipment Recommendations  None recommended by PT    Recommendations for Other Services       Precautions / Restrictions Precautions Precautions: Fall Restrictions Weight Bearing Restrictions: No    Mobility  Bed Mobility Overal bed mobility: Needs Assistance Bed Mobility: Supine to Sit     Supine to sit: Supervision;HOB elevated     General bed mobility comments: increased time, labored movement  Transfers Overall transfer level: Needs assistance Equipment used: Rolling walker (2 wheeled) Transfers: Sit to/from [facility] Sit to Stand: Min guard Stand pivot transfers: Min guard       General transfer comment: using RW and assist  Ambulation/Gait Ambulation/Gait assistance: Min  guard Gait Distance (Feet): 100 Feet Assistive device: Rolling walker (2 wheeled) Gait Pattern/deviations: Decreased step length - right;Decreased step length - left;Decreased stride length;Step-through pattern Gait velocity: decreased   General Gait Details: slightly labored cadence without loss of balance, limited secondary to fatigue   Stairs             Wheelchair Mobility    Modified Rankin (Stroke Patients Only)       Balance Overall balance assessment: Needs assistance Sitting-balance support: Feet supported;No upper extremity supported Sitting balance-Leahy Scale: Good Sitting balance - Comments: seated at EOB   Standing balance support: During functional activity;Bilateral upper extremity supported Standing balance-Leahy Scale: Fair Standing balance comment: using RW                            Cognition Arousal/Alertness: Awake/alert Behavior During Therapy: WFL for tasks assessed/performed Overall Cognitive Status: Within Functional Limits for tasks assessed                                        Exercises General Exercises - Lower Extremity Ankle Circles/Pumps: AROM;Both;20 reps;Seated Long Arc Quad: AROM;Both;Seated;20 reps Hip Flexion/Marching: AROM;Both;20 reps;Seated    General Comments        Pertinent Vitals/Pain Pain Assessment: No/denies pain    Home Living                      Prior Function            PT Goals (current goals can now  be found in the care plan section) Acute Rehab PT Goals Patient Stated Goal: go to rehab and return home PT Goal Formulation: With patient Time For Goal Achievement: 07/05/19 Potential to Achieve Goals: Fair Progress towards PT goals: Progressing toward goals    Frequency    Min 3X/week      PT Plan Current plan remains appropriate    Co-evaluation              AM-PAC PT "6 Clicks" Mobility   Outcome Measure  Help needed turning from your back  to your side while in a flat bed without using bedrails?: None Help needed moving from lying on your back to sitting on the side of a flat bed without using bedrails?: A Little Help needed moving to and from a bed to a chair (including a wheelchair)?: A Little Help needed standing up from a chair using your arms (e.g., wheelchair or bedside chair)?: A Little Help needed to walk in hospital room?: A Little Help needed climbing 3-5 steps with a railing? : A Lot 6 Click Score: 18    End of Session Equipment Utilized During Treatment: Gait belt Activity Tolerance: Patient tolerated treatment well Patient left: in bed;with call bell/phone within reach Nurse Communication: Mobility status PT Visit Diagnosis: Unsteadiness on feet (R26.81);Other abnormalities of gait and mobility (R26.89);Muscle weakness (generalized) (M62.81);History of falling (Z91.81)     Time: 0174-9449 PT Time Calculation (min) (ACUTE ONLY): 24 min  Charges:  $Therapeutic Activity: 23-37 mins                     2:27 PM, 06/24/19 Mearl Latin PT, DPT Physical Therapist at [facility]
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
## 2019-06-25 LAB — RENAL FUNCTION PANEL
Albumin: 2.6 g/dL — ABNORMAL LOW (ref 3.5–5.0)
Anion gap: 8 (ref 5–15)
BUN: 16 mg/dL (ref 8–23)
CO2: 27 mmol/L (ref 22–32)
Calcium: 8.7 mg/dL — ABNORMAL LOW (ref 8.9–10.3)
Chloride: 98 mmol/L (ref 98–111)
Creatinine, Ser: 0.96 mg/dL (ref 0.44–1.00)
GFR calc Af Amer: >60 mL/min (ref >60)
GFR calc non Af Amer: >60 mL/min (ref >60)
Glucose, Bld: 182 mg/dL — ABNORMAL HIGH (ref 70–99)
Phosphorus: 3.6 mg/dL (ref 2.5–4.6)
Potassium: 4.2 mmol/L (ref 3.5–5.1)
Sodium: 133 mmol/L — ABNORMAL LOW (ref 135–145)

## 2019-06-25 LAB — MAGNESIUM: Magnesium: 1.9 mg/dL (ref 1.7–2.4)
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
## 2019-06-25 NOTE — Progress Notes (Signed)
Danaya, Geddis #[id number] (CSN: [id number]) (64 y.o. F) (Adm: 06/20/19) [id number] PCP  [employer] Demographics Comment   Last edited by  on at   Address: Home Phone: Work Futures trader:  [place]  [place] [address]   [phone] -- --  SSN: Insurance: Marital Status: Religion:  [id number] [employer]  Patient Ethnicity & Race  Ethnic Group Patient Race  Not Hispanic or Latino White or Caucasian  Documents Filed to Patient  Power of Attorney Living Will Clinical Unknown Study Attachment Consent Form ABN Waiver After Visit Summary Lab Result Scan Code Status MyChart Status Advance Care Planning  Not on File Not on File Not on File Not on File Filed Not on File Jani Files FULL [Updated on 06/20/19 1532] Pending Jump to the Activity  Admission Information  Current Information  Attending Provider Admitting Provider Admission Type Admission Status  Roxan Hockey, MD Roxan Hockey, MD Emergency Admission (Confirmed)       Admission Date/Time Discharge Date Hospital Service Auth/Cert Status  82/42/35 08:30 AM  Internal Medicine [facility] Room/Bed   [facility] AP-DEPT [facility] Account  Name Acct ID Class Status Primary Coverage  Marcell, Pfeifer [id number] Inpatient Open [place] PPO      Guarantor Account (for Hospital Account [id number])  Name Relation to Trona? Acct Type  Rexene Alberts Self CHSA Yes Personal/Family  Address Phone    [address]  [place], [place] [address] [phone])        Coverage Information (for Hospital Account [id number])  F/O Payor/Plan Precert #  [facility] ADVANTAGE/[employer] ADVANTAGE PPO   Subscriber Subscriber #  [place], [place] [id number]  Address Phone  [address] [place] [address] [phone]       Care Everywhere ID:   [id number]

## 2019-06-25 NOTE — Progress Notes (Signed)
Patient transferred to 325 via wheelchair. No s/s of distress. Patient belongings and medications sent to transfer unit. Patient stable and A&Ox4. Report given to K. Nancee Liter.

## 2019-06-25 NOTE — Care Management Important Message (Signed)
Important Message  Patient Details  Name: [name] [name] MRN: [id number] Date of Birth: [birth year]   Medicare Important Message Given:  Yes(Cara,RN agreed to deliver letter to patient room)     Tommy Medal 06/25/2019, 2:58 PM

## 2019-06-25 NOTE — Progress Notes (Signed)
Physical Therapy Treatment Patient Details Name: [name] [name] MRN: [id number] DOB: [birth year] Today's Date: 06/25/2019    History of Present Illness [name] [name]  is a 64 y.o. female  with history of morbid obesity, atrial fibrillation on Eliquis, CAD, COPD, diabetes mellitus type 2 on insulin therapy, [name] [name], history of thyroid cancer, hypothyroidism, hypertension recently discharged after treatment for cellulitis and Pseudomonas UTI presents to the ED with fatigue    PT Comments    Patient able to complete bed mobility without PT assist and transfers with contact guard assist for safety. Patient continues to show generalized weakness when attempting to tranfer to standing. She is able to ambulate increased distance today and becomes minimally SOB toward the end which decreases upon resting. Patient left in chair at end of session. Patient will benefit from continued physical therapy in hospital and recommended venue below to increase strength, balance, endurance for safe ADLs and gait.    Follow Up Recommendations  SNF     Equipment Recommendations  None recommended by PT    Recommendations for Other Services       Precautions / Restrictions Precautions Precautions: Fall Restrictions Weight Bearing Restrictions: No    Mobility  Bed Mobility Overal bed mobility: Needs Assistance Bed Mobility: Supine to Sit     Supine to sit: Supervision;HOB elevated     General bed mobility comments: increased time, labored movement  Transfers Overall transfer level: Needs assistance Equipment used: Rolling walker (2 wheeled) Transfers: Sit to/from [facility] Sit to Stand: Min guard Stand pivot transfers: Min guard       General transfer comment: using RW; x3 from bed  Ambulation/Gait Ambulation/Gait assistance: Min guard Gait Distance (Feet): 120 Feet Assistive device: Rolling walker (2 wheeled) Gait Pattern/deviations: Decreased step length - right;Decreased  step length - left;Decreased stride length;Step-through pattern Gait velocity: decreased   General Gait Details: slightly labored cadence without loss of balance, limited secondary to fatigue, becomes minimally SOB   Stairs             Wheelchair Mobility    Modified Rankin (Stroke Patients Only)       Balance Overall balance assessment: Needs assistance Sitting-balance support: Feet supported;No upper extremity supported Sitting balance-Leahy Scale: Good Sitting balance - Comments: seated at EOB   Standing balance support: During functional activity;Bilateral upper extremity supported Standing balance-Leahy Scale: Fair Standing balance comment: using RW                            Cognition Arousal/Alertness: Awake/alert Behavior During Therapy: WFL for tasks assessed/performed Overall Cognitive Status: Within Functional Limits for tasks assessed                                        Exercises General Exercises - Lower Extremity Ankle Circles/Pumps: AROM;Both;20 reps;Seated Long Arc Quad: AROM;Both;Seated;20 reps Hip Flexion/Marching: AROM;Both;20 reps;Seated Toe Raises: AROM;Both;20 reps;Seated Heel Raises: AROM;20 reps;Both;Seated    General Comments        Pertinent Vitals/Pain Pain Assessment: No/denies pain    Home Living                      Prior Function            PT Goals (current goals can now be found in the care plan section) Acute Rehab PT Goals Patient Stated  Goal: go to rehab and return home PT Goal Formulation: With patient Time For Goal Achievement: 07/05/19 Potential to Achieve Goals: Fair Progress towards PT goals: Progressing toward goals    Frequency    Min 3X/week      PT Plan Current plan remains appropriate    Co-evaluation              AM-PAC PT "6 Clicks" Mobility   Outcome Measure  Help needed turning from your back to your side while in a flat bed without using  bedrails?: None Help needed moving from lying on your back to sitting on the side of a flat bed without using bedrails?: A Little Help needed moving to and from a bed to a chair (including a wheelchair)?: A Little Help needed standing up from a chair using your arms (e.g., wheelchair or bedside chair)?: A Little Help needed to walk in hospital room?: A Little Help needed climbing 3-5 steps with a railing? : A Lot 6 Click Score: 18    End of Session Equipment Utilized During Treatment: Gait belt Activity Tolerance: Patient tolerated treatment well Patient left: in bed;with call bell/phone within reach Nurse Communication: Mobility status PT Visit Diagnosis: Unsteadiness on feet (R26.81);Other abnormalities of gait and mobility (R26.89);Muscle weakness (generalized) (M62.81);History of falling (Z91.81)     Time: 1105-1130 PT Time Calculation (min) (ACUTE ONLY): 25 min  Charges:  $Therapeutic Exercise: 8-22 mins $Therapeutic Activity: 8-22 mins                     11:48 AM, 06/25/19 [name] [name] PT, DPT Physical Therapist at [facility]
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
## 2019-06-26 LAB — GLUCOSE, CAPILLARY
Glucose-Capillary: 153 mg/dL — ABNORMAL HIGH (ref 70–99)
Glucose-Capillary: 148 mg/dL — ABNORMAL HIGH (ref 70–99)
Glucose-Capillary: 151 mg/dL — ABNORMAL HIGH (ref 70–99)
Glucose-Capillary: 116 mg/dL — ABNORMAL HIGH (ref 70–99)
Glucose-Capillary: 168 mg/dL — ABNORMAL HIGH (ref 70–99)
Glucose-Capillary: 177 mg/dL — ABNORMAL HIGH (ref 70–99)
Glucose-Capillary: 171 mg/dL — ABNORMAL HIGH (ref 70–99)
Glucose-Capillary: 121 mg/dL — ABNORMAL HIGH (ref 70–99)

## 2019-06-26 MED ORDER — ACETAMINOPHEN 325 MG PO TABS: 650.0000 mg | ORAL_TABLET | Freq: Four times a day (QID) | ORAL | 0 refills | Status: AC | PRN

## 2019-06-26 MED ORDER — TRAZODONE HCL 150 MG PO TABS: 150.0000 mg | ORAL_TABLET | Freq: Every day | ORAL | 3 refills | Status: AC

## 2019-06-26 MED ORDER — SENNOSIDES-DOCUSATE SODIUM 8.6-50 MG PO TABS: 2.0000 | ORAL_TABLET | Freq: Every day | ORAL | 1 refills | Status: AC

## 2019-06-26 MED ORDER — HYDROCODONE-ACETAMINOPHEN 5-325 MG PO TABS: 1.0000 | ORAL_TABLET | Freq: Four times a day (QID) | ORAL | 0 refills | Status: AC | PRN

## 2019-06-26 MED ORDER — INSULIN ASPART 100 UNIT/ML ~~LOC~~ SOLN: SUBCUTANEOUS | 3 refills | Status: AC

## 2019-06-26 NOTE — Discharge Summary (Addendum)
[name] [name], is a 64 y.o. female  DOB 1955/12/24  MRN [id number].  Admission date:  06/20/2019  Admitting Physician  Roxan Hockey, MD  Discharge Date:  06/29/2019   Primary MD  Redmond School, MD  Recommendations for primary care physician for things to follow:   1)insulin aspart (novoLOG) injection 0-9 Units 0-9 Units  Subcutaneous, 3 times daily with meals CBG < 70: Implement Hypoglycemia Standing Orders and refer to Hypoglycemia Standing Orders sidebar report   CBG 70 - 120: 0 units  CBG 121 - 150: 0 unit  CBG 151 - 200: 2 units  CBG 201 - 250: 3 units  CBG 251 - 300: 5 units  CBG 301 - 350: 7 units  CBG 351 - 400: 9 units  CBG > 400: 10 units  2)Avoid ibuprofen/Advil/Aleve/Motrin/Goody Powders/Naproxen/BC powders/Meloxicam/Diclofenac/Indomethacin and other Nonsteroidal anti-inflammatory medications as these will make you more likely to bleed and can cause stomach ulcers, can also cause Kidney problems.  Admission Diagnosis  Hypokalemia [E87.6] Acute cystitis without hematuria [N30.00]   Discharge Diagnosis  Hypokalemia [E87.6] Acute cystitis without hematuria [N30.00]    Principal Problem:   Hypokalemia Active Problems:   Type 2 diabetes mellitus (HCC)   Essential hypertension, benign   Atrial fibrillation (HCC)   NASH (nonalcoholic steatohepatitis)   Acute lower UTI      Past Medical History:  Diagnosis Date  . Anemia   . Anxiety   . Arthritis   . Asthma   . Atrial fibrillation ([place])   . Cirrhosis of liver ([place])   . COPD (chronic obstructive pulmonary disease) ([place])   . Coronary artery disease   . Depression   . Edema   . Essential hypertension, benign   . GERD (gastroesophageal reflux disease)   . Hypothyroidism   . Morbid obesity ([place])   . Overactive bladder   . Sleep apnea    CPAP - not consistent  . Thyroid cancer ([place])   . Type 2 diabetes mellitus ([place])      Past Surgical History:  Procedure Laterality Date  . "pump bumps"     bilateral heels--2000  . ABDOMINAL HYSTERECTOMY  1985  . CARDIAC CATHETERIZATION  2012   Dr. Lia Foyer told her nothing was wrong.  [name] [place]  . CHOLECYSTECTOMY  1996  . COLONOSCOPY WITH PROPOFOL N/A 04/17/2015   Procedure: COLONOSCOPY WITH PROPOFOL  at cecum at 0810; withdrawal time =15 minutes;  Surgeon: [name], MD;  Location: [facility];  Service: Endoscopy;  Laterality: N/A;  . Debridement of abdominal wound  2011  . ESOPHAGEAL DILATION N/A 04/17/2015   Procedure: ESOPHAGEAL DILATION WITH 56FR MALONEY DILATOR;  Surgeon: [name], MD;  Location: [facility];  Service: Endoscopy;  Laterality: N/A;  . ESOPHAGOGASTRODUODENOSCOPY (EGD) WITH PROPOFOL N/A 04/17/2015   Procedure: ESOPHAGOGASTRODUODENOSCOPY (EGD) WITH PROPOFOL;  Surgeon: [name], MD;  Location: [facility];  Service: Endoscopy;  Laterality: N/A;  . EXPLORATORY LAPAROTOMY  2003  . FOOT SURGERY Bilateral 2000   Bone  spur removed  . FOOT SURGERY    . INCISIONAL HERNIA REPAIR N/A 06/09/2014   Procedure: RECURRENT  INCISIONAL HERNIORRHAPHY WITH MESH;  Surgeon: [name], MD;  Location: [facility];  Service: General;  Laterality: N/A;  . INCISIONAL HERNIA REPAIR N/A 10/15/2014   Procedure: [place];  Surgeon: [name], MD;  Location: [place];  Service: General;  Laterality: N/A;  . INSERTION OF MESH N/A 06/09/2014   Procedure: INSERTION OF MESH;  Surgeon: [name], MD;  Location: [facility];  Service: General;  Laterality: N/A;  . INSERTION OF MESH N/A 10/15/2014   Procedure: INSERTION OF MESH;  Surgeon: [name], MD;  Location: [place];  Service: General;  Laterality: N/A;  . POLYPECTOMY  04/17/2015   Procedure: POLYPECTOMY;  Surgeon: [name], MD;  Location: [facility];  Service: Endoscopy;;  . RESECTION DISTAL CLAVICAL  05/07/2012   Procedure: RESECTION DISTAL CLAVICAL;  Surgeon:  [name], MD;  Location: [facility];  Service: Orthopedics;  Laterality: Right;  . SHOULDER OPEN ROTATOR CUFF REPAIR  05/07/2012   Procedure: ROTATOR CUFF REPAIR SHOULDER OPEN;  Surgeon: [name], MD;  [facility]: [facility];  Service: Orthopedics;  Laterality: Right;  . TEE WITHOUT CARDIOVERSION N/A 12/26/2018   Procedure: TRANSESOPHAGEAL ECHOCARDIOGRAM (TEE) WITH PROPOFOL;  Surgeon: Arnoldo Lenis, MD;  [facility];  Service: Endoscopy;  Laterality: N/A;  . THYROIDECTOMY  2009  . TONSILLECTOMY  1958  . UMBILICAL HERNIA REPAIR  2010     HPI  from the history and physical done on the day of admission:   - [name] [name]  is a 64 y.o. female with history of morbid obesity, atrial fibrillation on Eliquis, CAD, COPD, diabetes mellitus type 2 on insulin therapy, [name], history of thyroid cancer, hypothyroidism, hypertension recently discharged after treatment for cellulitis and Pseudomonas UTI presents to the ED with fatigue -She is found to have potassium of 2.6 and magnesium of 1.4 -Patient apparently had episode of diarrhea last week but currently no diarrhea, denies emesis, PTA she was on metolazone -No chest pains no palpitations, no increased shortness of breath -Apparently patient living situation is of concern to her neighbor that they have  called social services    Hospital Course:   Brief Summary:- 64 y.o.femalewith history of morbid obesity, atrial fibrillation on Eliquis, CAD, COPD, diabetes mellitus type 2 on insulin therapy, [place] liver cirrhosis,, history of thyroid cancer, hypothyroidism, hypertensionrecently discharged after treatment for cellulitis and Pseudomonas UTI readmitted with hypokalemia and hypomagnesemia and fatigue with weakness  A/p 1)severe hypokalemia/hypomagnesemia----Replaced and normalized post replacement-PTA patient was on metolazone 5 mg daily, okay to stop it  2) recurrent Pseudomonas UTI--- treated with Cipro for  pansensitive Pseudomonas UTI from 06/20/2019 thru 06/25/2019  3)DM2-continue insulin regimen,Lantus 33 units nightly along with sliding scale coverage  4)generalized weakness and deconditioning---poor living situation at home---social work consult appreciated,  -PT consult appreciated, physical therapist recommends SNF rehab -Apparently patient living situation is of concern to her neighbor that they have called social services -Patient's insurance company declined SNF rehab, patient will be discharged home with home health rehab  5)Decubitus ulcers and Excoriated areas-POA -POA Wounds bed: 1. Left ischial tuberosity: Stage 2 Pressure Injury 2. Right ischial tuberosity: Stage 2 Pressure Injury--- sacral decub--POA,  -multiple excoriated areas on trunk and extremities --no evidence of significant superimposed cellulitis at this time----wound care consult appreciated, local wound care as advised  6)hypothyroidism--continue levothyroxine 275 mcg daily  7)Morbid  Obesity---this complicates overall care  8) Chronicatrial fibrillation--continue Eliquis for stroke prophylaxis and Cardizem CD 360 mg for rate control   Disposition- Patient's insurance company declined SNF rehab, patient will be discharged home with home health rehab  Code Status : Full  Family Communication:   NA (patient is alert, awake and coherent)  Consults  : Wound care  DVT Prophylaxis  :  Eliquis -   Discharge Condition: stable  Follow UP--- PCP post discharge  Diet and Activity recommendation:  As advised  Discharge Instructions    Discharge Instructions    Call MD for:  difficulty breathing, headache or visual disturbances   Complete by: As directed    Call MD for:  persistant dizziness or light-headedness   Complete by: As directed    Call MD for:  persistant nausea and vomiting   Complete by: As directed    Call MD for:  severe uncontrolled pain   Complete by: As directed    Call  MD for:  temperature >100.4   Complete by: As directed    Diet - low sodium heart healthy   Complete by: As directed    Diet Carb Modified   Complete by: As directed    Discharge instructions   Complete by: As directed    1)insulin aspart (novoLOG) injection 0-9 Units 0-9 Units  Subcutaneous, 3 times daily with meals CBG < 70: Implement Hypoglycemia Standing Orders and refer to Hypoglycemia Standing Orders sidebar report   CBG 70 - 120: 0 units  CBG 121 - 150: 0 unit  CBG 151 - 200: 2 units  CBG 201 - 250: 3 units  CBG 251 - 300: 5 units  CBG 301 - 350: 7 units  CBG 351 - 400: 9 units  CBG > 400: 10 units  2)Avoid ibuprofen/Advil/Aleve/Motrin/Goody Powders/Naproxen/BC powders/Meloxicam/Diclofenac/Indomethacin and other Nonsteroidal anti-inflammatory medications as these will make you more likely to bleed and can cause stomach ulcers, can also cause Kidney problems.   Increase activity slowly   Complete by: As directed         Discharge Medications     Allergies as of 06/29/2019      Reactions   Doxycycline Itching   Adhesive [tape] Other (See Comments)   Blisters skin   Biaxin [clarithromycin] Other (See Comments)   Really bad yeast infection   Percocet [oxycodone-acetaminophen] Itching   Xarelto [rivaroxaban] Itching   Clarithromycin Rash   Yeast Infection   Clindamycin/lincomycin Rash   Yeast Infection    Neosporin [neomycin-polymyxin B Gu] Rash   Eyes Drops only   Penicillins Rash   Has patient had a PCN reaction causing immediate rash, facial/tongue/throat swelling, SOB or lightheadedness with hypotension: Yes Has patient had a PCN reaction causing severe rash involving mucus membranes or skin necrosis: No Has patient had a PCN reaction that required hospitalization: No Has patient had a PCN reaction occurring within the last 10 years: No If all of the above answers are "NO", then may proceed with Cephalosporin use.      Medication List    STOP taking these  medications   lisinopril 20 MG tablet Commonly known as: ZESTRIL   meloxicam 15 MG tablet Commonly known as: MOBIC   metolazone 5 MG tablet Commonly known as: ZAROXOLYN     TAKE these medications   acetaminophen 325 MG tablet Commonly known as: TYLENOL Take 2 tablets (650 mg total) by mouth every 6 (six) hours as needed for mild pain (or Fever >/= 101).  apixaban 5 MG Tabs tablet Commonly known as: Eliquis Take 1 tablet (5 mg total) by mouth 2 (two) times daily.   colesevelam 625 MG tablet Commonly known as: WELCHOL Take 1,875 mg by mouth as directed. 3 tabs am 3 tabs pm   diltiazem 360 MG 24 hr capsule Commonly known as: TIAZAC TAKE ONE CAPSULE (360MG TOTAL) BY MOUTH DAILY What changed: See the new instructions.   DULoxetine 60 MG capsule Commonly known as: CYMBALTA Take 120 mg by mouth daily.   econazole nitrate 1 % cream Apply topically daily. What changed:   how much to take  when to take this  reasons to take this   gabapentin 300 MG capsule Commonly known as: NEURONTIN Take 300 mg by mouth daily.   HYDROcodone-acetaminophen 5-325 MG tablet Commonly known as: NORCO/VICODIN Take 1 tablet by mouth every 6 (six) hours as needed for moderate pain or severe pain. What changed:   when to take this  reasons to take this   hydrOXYzine 25 MG tablet Commonly known as: ATARAX/VISTARIL Take 25 mg by mouth 4 (four) times daily as needed for itching.   insulin aspart 100 UNIT/ML injection Commonly known as: NovoLOG insulin aspart (novoLOG) injection 0-9 Units 0-9 Units Subcutaneous, 3 times daily with meals CBG < 70: Implement Hypoglycemia Standing Orders and refer to Hypoglycemia Standing Orders sidebar report  CBG 70 - 120: 0 units  CBG 121 - 150: 0 unit CBG 151 - 200: 2 units CBG 201 - 250: 3 units CBG 251 - 300: 5 units CBG 301 - 350: 7 units  CBG 351 - 400: 9 units CBG > 400: 10 units   Lantus SoloStar 100 UNIT/ML Solostar Pen Generic drug: Insulin  Glargine Inject 33 Units into the skin at bedtime.   levothyroxine 200 MCG tablet Commonly known as: SYNTHROID Take 200 mcg by mouth daily before breakfast. Take in addition to 75 mcg for a total of 275 mcg daily   levothyroxine 75 MCG tablet Commonly known as: SYNTHROID Take 1 tablet (75 mcg total) by mouth daily before breakfast.   magnesium oxide 400 (241.3 Mg) MG tablet Commonly known as: MAG-OX Take 1 tablet (400 mg total) by mouth daily.   metFORMIN 1000 MG tablet Commonly known as: GLUCOPHAGE Take 1,000 mg by mouth 2 (two) times daily with a meal.   methocarbamol 500 MG tablet Commonly known as: ROBAXIN Take 1 tablet (500 mg total) by mouth every 6 (six) hours as needed for muscle spasms.   nystatin cream Commonly known as: MYCOSTATIN Apply to affected area 2 times daily What changed: Another medication with the same name was removed. Continue taking this medication, and follow the directions you see here.   potassium chloride SA 20 MEQ tablet Commonly known as: KLOR-CON Take 20 mEq by mouth 2 (two) times daily.   pravastatin 20 MG tablet Commonly known as: PRAVACHOL Take 20 mg by mouth daily.   Proventil HFA 108 (90 Base) MCG/ACT inhaler Generic drug: albuterol Inhale 2 puffs into the lungs every 6 (six) hours as needed for wheezing or shortness of breath. Shortness of breath   senna-docusate 8.6-50 MG tablet Commonly known as: Senokot-S Take 2 tablets by mouth at bedtime.   tolterodine 4 MG 24 hr capsule Commonly known as: DETROL LA Take 4 mg by mouth 2 (two) times a day.   traZODone 150 MG tablet Commonly known as: DESYREL Take 1 tablet (150 mg total) by mouth at bedtime. What changed:   medication strength  how much to take       Major procedures and Radiology Reports - PLEASE review detailed and final reports for all details, in brief -   DG Chest 2 View  Result Date: 06/07/2019 CLINICAL DATA:  [place] EXAM: CHEST - 2 VIEW COMPARISON:   05/30/2019 FINDINGS: Heart size remains enlarged. There is vascular congestion without signs of interstitial edema. No dense consolidation or sign of pleural effusion. No acute bone finding. IMPRESSION: No active cardiopulmonary disease. Electronically Signed   By: Zetta Bills M.D.   On: 06/07/2019 20:05   DG Chest 2 View  Result Date: 05/30/2019 CLINICAL DATA:  Confusion.  Dehydration. EXAM: CHEST - 2 VIEW COMPARISON:  05/21/2019 FINDINGS: The heart is significantly enlarged. Aortic calcifications are noted. The lung volumes are low. Bibasilar atelectasis is suspected. There is some elevation of the right hemidiaphragm. There are findings of vascular congestion/congestive heart failure. IMPRESSION: Cardiomegaly with findings of congestive heart failure. Electronically Signed   By: Constance Holster M.D.   On: 05/30/2019 19:31   CT Head Wo Contrast  Result Date: 05/30/2019 CLINICAL DATA:  Increased confusion EXAM: CT HEAD WITHOUT CONTRAST TECHNIQUE: Contiguous axial images were obtained from the base of the skull through the vertex without intravenous contrast. COMPARISON:  05/21/2019 FINDINGS: Brain: No evidence of acute infarction, hemorrhage, hydrocephalus, extra-axial collection or mass lesion/mass effect. Vascular: No hyperdense vessel or unexpected calcification. Skull: Normal. Negative for fracture or focal lesion. Sinuses/Orbits: No acute finding. Other: Mild residual soft tissue swelling is noted in the forehead on the right. IMPRESSION: No acute intracranial abnormality noted. Electronically Signed   By: Inez Catalina M.D.   On: 05/30/2019 22:30   DG Chest Port 1 View  Result Date: 06/20/2019 CLINICAL DATA:  Fatigue, weakness. EXAM: PORTABLE CHEST 1 VIEW COMPARISON:  Chest radiograph 06/07/2019 FINDINGS: Unchanged cardiomegaly. Aortic atherosclerosis. No airspace consolidation or appreciable pulmonary edema. No evidence of pleural effusion or pneumothorax. No acute bony abnormality.  IMPRESSION: Cardiomegaly. No airspace consolidation or appreciable pulmonary edema. Electronically Signed   By: Kellie Simmering DO   On: 06/20/2019 09:55    Micro Results    Recent Results (from the past 240 hour(s))  Urine culture     Status: Abnormal   Collection Time: 06/20/19 10:04 AM   Specimen: Urine, Clean Catch  Result Value Ref Range Status   Specimen Description URINE, CLEAN CATCH  Final   Special Requests NONE  Final   Culture >=100,000 COLONIES/mL PSEUDOMONAS AERUGINOSA (A)  Final   Report Status 06/22/2019 FINAL  Final   Organism ID, Bacteria PSEUDOMONAS AERUGINOSA (A)  Final      Susceptibility   Pseudomonas aeruginosa - MIC*    CEFTAZIDIME 4 SENSITIVE Sensitive     CIPROFLOXACIN <=0.25 SENSITIVE Sensitive     GENTAMICIN <=1 SENSITIVE Sensitive     IMIPENEM 2 SENSITIVE Sensitive     PIP/TAZO <=4 SENSITIVE Sensitive     CEFEPIME 2 SENSITIVE Sensitive     * >=100,000 COLONIES/mL PSEUDOMONAS AERUGINOSA  SARS CORONAVIRUS 2 (TAT 6-24 HRS) Nasopharyngeal Nasopharyngeal Swab     Status: None   Collection Time: 06/20/19  2:16 PM   Specimen: Nasopharyngeal Swab  Result Value Ref Range Status   SARS Coronavirus 2 NEGATIVE NEGATIVE Final    Comment: (NOTE) SARS-CoV-2 target nucleic acids are NOT DETECTED. The SARS-CoV-2 RNA is generally detectable in upper and lower respiratory specimens during the acute phase of infection. Negative results do not preclude SARS-CoV-2 infection, do not rule out co-infections with  other pathogens, and should not be used as the sole basis for treatment or other patient management decisions. Negative results must be combined with clinical observations, patient history, and epidemiological information. The expected result is Negative. Fact Sheet for Patients: SugarRoll.be Fact Sheet for Healthcare Providers: https://www.woods-mathews.com/ This test is not yet approved or cleared by the [place] FDA  and  has been authorized for detection and/or diagnosis of SARS-CoV-2 by FDA under an Emergency Use Authorization (EUA). This EUA will remain  in effect (meaning this test can be used) for the duration of the COVID-19 declaration under Section 56 4(b)(1) of the Act, 21 U.S.C. section 360bbb-3(b)(1), unless the authorization is terminated or revoked sooner. Performed at [facility], [place] [address] [place], [place] [address]   MRSA PCR Screening     Status: None   Collection Time: 06/20/19  4:06 PM   Specimen: Nasal Mucosa; Nasopharyngeal  Result Value Ref Range Status   MRSA by PCR NEGATIVE NEGATIVE Final    Comment:        The GeneXpert MRSA Assay (FDA approved for NASAL specimens only), is one component of a comprehensive MRSA colonization surveillance program. It is not intended to diagnose MRSA infection nor to guide or monitor treatment for MRSA infections. Performed at [facility], [address] [place], [place] [address]        Today   Subjective    [name] [name] today has no New complaints except for fatigue No fever  Or chills   No Nausea, Vomiting or Diarrhea -Overall feels much better       Patient has been seen and examined prior to discharge   Objective   Blood pressure 126/77, pulse 79, temperature 97.9 F (36.6 C), temperature source Oral, resp. rate 17, height 5' 5"  (1.651 m), weight 118 kg, SpO2 100 %.   Intake/Output Summary (Last 24 hours) at 06/29/2019 1228 Last data filed at 06/28/2019 2300 Gross per 24 hour  Intake 600 ml  Output 1100 ml  Net -500 ml    Exam Gen:- Awake Alert, morbidly obese, in NAD HEENT:- [place].AT, No sclera icterus Neck-Supple Neck,No JVD,.  Lungs-  CTAB , fair symmetrical air movement CV- S1, S2 normal, regular  Abd-  +ve B.Sounds, Abd Soft, No tenderness, increased truncal adiposity Extremity - pedal pulses present  Psych-affect is appropriate, oriented x3, cooperative Neuro-generalized weakness no new  focal deficits, no tremors Skin- multiple areas of excoriation  and scratches and Left ischial tuberosity:  1)Stage 2 Pressure Injury 2. Right ischial tuberosity: Stage 2 Pressure Injury--- 3) sacral decub--POA-----  --No frank cellulitis   Data Review   CBC w Diff:  Lab Results  Component Value Date   WBC 4.9 06/27/2019   HGB 8.5 (L) 06/27/2019   HCT 29.3 (L) 06/27/2019   PLT 120 (L) 06/27/2019   LYMPHOPCT 25 06/20/2019   MONOPCT 6 06/20/2019   EOSPCT 3 06/20/2019   BASOPCT 1 06/20/2019    CMP:  Lab Results  Component Value Date   NA 136 06/27/2019   K 3.5 06/27/2019   CL 100 06/27/2019   CO2 28 06/27/2019   BUN 20 06/27/2019   CREATININE 0.86 06/27/2019   CREATININE 0.66 05/31/2012   PROT 7.8 06/20/2019   ALBUMIN 2.6 (L) 06/25/2019   BILITOT 0.7 06/20/2019   ALKPHOS 189 (H) 06/20/2019   AST 20 06/20/2019   ALT 18 06/20/2019  .   Total Discharge time is about 33 minutes  Roxan Hockey M.D on  06/29/2019 at 12:28 PM  Go to [email or web address] -  for contact info  [facility] - Office  [phone]

## 2019-06-26 NOTE — Discharge Instructions (Signed)
1)insulin aspart (novoLOG) injection 0-9 Units 0-9 Units  Subcutaneous, 3 times daily with meals CBG < 70: Implement Hypoglycemia Standing Orders and refer to Hypoglycemia Standing Orders sidebar report   CBG 70 - 120: 0 units  CBG 121 - 150: 0 unit  CBG 151 - 200: 2 units  CBG 201 - 250: 3 units  CBG 251 - 300: 5 units  CBG 301 - 350: 7 units  CBG 351 - 400: 9 units  CBG > 400: 10 units  2)Avoid ibuprofen/Advil/Aleve/Motrin/Goody Powders/Naproxen/BC powders/Meloxicam/Diclofenac/Indomethacin and other Nonsteroidal anti-inflammatory medications as these will make you more likely to bleed and can cause stomach ulcers, can also cause Kidney problems.

## 2019-06-27 LAB — CBC
HCT: 29.3 % — ABNORMAL LOW (ref 36.0–46.0)
Hemoglobin: 8.5 g/dL — ABNORMAL LOW (ref 12.0–15.0)
MCH: 25 pg — ABNORMAL LOW (ref 26.0–34.0)
MCHC: 29 g/dL — ABNORMAL LOW (ref 30.0–36.0)
MCV: 86.2 fL (ref 80.0–100.0)
Platelets: 120 10*3/uL — ABNORMAL LOW (ref 150–400)
RBC: 3.4 MIL/uL — ABNORMAL LOW (ref 3.87–5.11)
RDW: 15.3 % (ref 11.5–15.5)
WBC: 4.9 10*3/uL (ref 4.0–10.5)
nRBC: 0 % (ref 0.0–0.2)

## 2019-06-27 LAB — BASIC METABOLIC PANEL
Anion gap: 8 (ref 5–15)
BUN: 20 mg/dL (ref 8–23)
CO2: 28 mmol/L (ref 22–32)
Calcium: 8.7 mg/dL — ABNORMAL LOW (ref 8.9–10.3)
Chloride: 100 mmol/L (ref 98–111)
Creatinine, Ser: 0.86 mg/dL (ref 0.44–1.00)
GFR calc Af Amer: >60 mL/min (ref >60)
GFR calc non Af Amer: >60 mL/min (ref >60)
Glucose, Bld: 166 mg/dL — ABNORMAL HIGH (ref 70–99)
Potassium: 3.5 mmol/L (ref 3.5–5.1)
Sodium: 136 mmol/L (ref 135–145)

## 2019-06-27 LAB — GLUCOSE, CAPILLARY
Glucose-Capillary: 143 mg/dL — ABNORMAL HIGH (ref 70–99)
Glucose-Capillary: 177 mg/dL — ABNORMAL HIGH (ref 70–99)
Glucose-Capillary: 164 mg/dL — ABNORMAL HIGH (ref 70–99)
Glucose-Capillary: 226 mg/dL — ABNORMAL HIGH (ref 70–99)

## 2019-06-27 MED ORDER — MAGNESIUM SULFATE 2 GM/50ML IV SOLN
2.0000 g | Freq: Once | INTRAVENOUS | Status: AC
  Administered 2019-06-27: 2 g via INTRAVENOUS
  Filled 2019-06-27: qty 50

## 2019-06-27 MED ORDER — POTASSIUM CHLORIDE CRYS ER 20 MEQ PO TBCR
40.0000 meq | EXTENDED_RELEASE_TABLET | ORAL | Status: AC
  Administered 2019-06-27 (×2): 40 meq via ORAL
  Filled 2019-06-27: qty 4
  Filled 2019-06-27: qty 2

## 2019-06-27 NOTE — Progress Notes (Signed)
CSW received call back from [facility] who states that they are unable to offer patient a bed at this time as it looks as if patient will turn into a long term care resident. Admissions Coordinator goes into detail and states that because there was documentation stating that there is an open DSS APS case open and that patient's home was unsuitable for living.   CSW attempted to contact patients case worker to inquire about the status of this case. CSW was told that once her case worker was identified, then represenative would give CSW a call back.   CSW will continue to seek placement for patient and will also seek advisement from [facility]. TOC team will continue to follow patient for any discharge related needs  [place] [facility]  Clinical Social Worker  Ph: [phone]

## 2019-06-27 NOTE — Care Management (Signed)
Patient Information  Patient Name  Nikayla, Madaris ([id number]) Legal Sex  Female DOB  [birth year]  Room Bed  A325 A325-01  Patient Demographics  Address  [place]  [place] [address] Phone  [phone] (Home) *Preferred* E-mail Address  [email or web address]  Patient Ethnicity & Race  Ethnic Group Patient Race  Not Hispanic or Latino White or Caucasian  Emergency Contact(s)  Name Relation Home Work Mobile  Lindsey,Ray Friend [place], [place] Granddaughter   [phone]  Documents on File   Status Date Received Description  Documents for the Patient  EMR Medication Summary Not Received    EMR Problem Summary Not Received    EMR Patient Summary Not Received    Driver's License Not Received    Driver's License Not Received    [place] Received 02/08/12   [place] E-Signature HIPAA Notice of Privacy Received 02/04/11   [place] E-Signature HIPAA Notice of Privacy [place] Signed 05/05/19   Advance Directives/Living Will/HCPOA/POA Not Received    [place] HIPAA NOTICE OF PRIVACY - Scanned Not Received    Insurance Card Received 09/01/14 BCBSMCR/DMM/RCGD  Historic Radiology Documentation Not Received    Insurance Card Not Received    Insurance Card Not Received    AMB Correspondence Not Received  Office Note 03/12 [facility]   AMB Correspondence Not Received  Card 04/12 [facility]  AMB Intake Forms/Questionnaires Not Received  [facility]  AMB Correspondence Not Received  Consent 04/12 [facility]  AMB Correspondence Not Received  Consent 04/12 [facility]  Financial Application Not Received    Insurance Card Not Received    Insurance Card Not Received    [place] Not Received    Insurance Card Not Received    AMB Intake Forms/Questionnaires Not Received    AMB Correspondence Not Received  07/13 P/N [facility]  AMB Correspondence Not Received  10/13  Referral [place] Not Received  10/13 [place] Card Received 06/26/12   Insurance Card Received 07/24/12 [place] Card Not Received    Insurance Card Received 10/25/12 [employer] Advantage 2017  Insurance Card Not Received    HIM ROI Authorization  01/24/13   HIM ROI Authorization  01/24/13   HIM ROI Authorization  03/15/13   Insurance Card  09/05/13   Release of Information  10/11/13   Release of Information  10/11/13   HIM ROI Authorization  09/02/14 RCGD--09/01/14 prog note to PCP ([place])  HIM ROI Authorization  09/04/14 RCGD--09/01/14 labs to PCP ([place]co)  Other Photo ID Not Received 07/04/15 [employer] Advantage 2017  Insurance Card Received 10/07/14   Insurance Card   [place] Medicare  HIM ROI Authorization  03/27/15 RCGD--03/26/15 prog note to PCP ([place])  HIM ROI Authorization (Expired) 04/13/15 Authorization for batch CIOX/[employer] of [employer]  HIM ROI Authorization  04/27/15 RCGD--04/17/15 TCS/EGD op note, pathology report and H pylori report to PCP (f[place]co)  Insurance Card Received 10/27/15 HEALTHTEAM ADVANTAGE/RCGD/HHS  HIM ROI Authorization  11/13/15 RCGD--10/27/15 labs to PCP ([place])  HIM ROI Authorization  11/19/15 RCGD--11/03/15 [place] report to PCP ([place]co)  Advanced Beneficiary Notice (ABN) Not Received    E-Signature AOB [place] Not Received    AMB Intake Forms/Questionnaires  01/27/16   AMB Correspondence  11/13/15 PATIENT LETTER [place] [facility]  Insurance Card   Medicare Advantage [place] 2017  HIM ROI Authorization  05/04/16 RCGD--05/03/16 [place] report to PCP ([place])  HIM ROI Authorization  05/30/16 Patient Request  Insurance Card   Medicare and Healthteam Adv cards 2018  Insurance Card Received 03/28/17 HEALTHTEAM ADVANTAGE/RCGD/HHS  HIM ROI Authorization  04/06/17 RCGD--04/03/17 labs and 04/05/17 [place] to PCP ([place])  Insurance Card Received 11/21/18 [facility]  card/healthteam adv  AMB Intake Forms/Questionnaires Received 10/05/17 04/19 [facility] REFERRAL [facility]  AMB Correspondence Received 07/16/18 1/20-12/19 [place] HIPAA NOTICE OF PRIVACY - Scanned Received 11/06/18 HIPAA 11/06/2018 - verbal consent obtained due to current national emergency  [place] Received 12/23/18 2020  HIM ROI Authorization  04/01/19 HEALTH TEAM ADVANTAGE  Other Photo ID Received 05/21/19 Exchange form  HIM ROI Authorization Received 06/11/19 [place] [facility]  [place] HIPAA NOTICE OF PRIVACY - Scanned (Deleted)    HIM Release of Information Output  04/01/19 Requested records  Patient Photo   Photo of Patient  HIM Release of Information Output  06/12/19 Requested records  HIM Release of Information Output  06/12/19 Requested records  HIM Release of Information Output  06/12/19 Requested records  HIM Release of Information Output  06/12/19 Requested records  Documents for the Encounter  AOB (Assignment of Insurance Benefits) Received 06/20/19 AOB verbal consent due to [facility] Emergency  E-signature AOB     MEDICARE RIGHTS Not Received    E-signature Medicare Rights Received 06/20/19   ED Patient Billing Extract   ED PB Billing Extract  Cardiac Monitoring Strip Shift Summary Received 06/22/19   EKG Received 06/24/19   Admission Information  Current Information  Attending Provider Admitting Provider Admission Type Admission Status  Roxan Hockey, MD Roxan Hockey, MD Emergency Admission (Confirmed)       Admission Date/Time Discharge Date Hospital Service Auth/Cert Status  16/10/96 08:30 AM  Internal Medicine [place] [facility] Room/Bed   [facility] AP-DEPT 300 A325/A325-01        Admission  Complaint  EMS  Hospital Account  Name Acct ID Class Status Primary Coverage  Verlinda, [place] [id number] Inpatient Open  [place] PPO      Guarantor Account (for Hospital Account [id number])  Name Relation to Pt Service Area Active? Acct Type  Rexene Alberts Self CHSA Yes Personal/Family  Address Phone    [address]  [place], [place] [address] [phone])        Coverage Information (for Hospital Account [id number])  F/O Payor/Plan Precert #  [facility] ADVANTAGE/HEALTHTEAM ADVANTAGE PPO   Subscriber Subscriber #  [place], [place] [id number]  Address Phone  [place] [address] [phone]       Care Everywhere ID:  [id number]

## 2019-06-27 NOTE — TOC Progression Note (Signed)
Transition of Care [facility] : [facility]) - Progression Note    Patient Details  Name: [name] [name] MRN: [id number] Date of Birth: 04-19-1956  Transition of Care [facility]) CM/SW [place], LCSW Phone Number: 06/27/2019, 2:11 PM  Clinical Narrative:  CSW received verbal permission from patient to send referral to [facility]. CSW sent intial referral and is awaiting bed placement offer.  [place] Transitions of Care  Clinical Social Worker  Ph: [phone]     Expected Discharge Plan: Skilled Nursing Facility Barriers to Discharge: Continued Medical Work up  Expected Discharge Plan and Services Expected Discharge Plan: [place]         Expected Discharge Date: 06/26/19                                     Social Determinants of Health (SDOH) Interventions    Readmission Risk Interventions Readmission Risk Prevention Plan 06/21/2019  Transportation Screening Not Complete  Medication Review Press photographer) Complete  PCP or Specialist appointment within 3-5 days of discharge Not Complete  HRI or Home Care Consult Complete  SW Recovery Care/Counseling Consult Complete  Palliative Care Screening Not Complete  Skilled Nursing Facility Complete  Some recent data might be hidden
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
## 2019-06-28 DIAGNOSIS — E876 Hypokalemia: Principal | ICD-10-CM

## 2019-06-28 LAB — GLUCOSE, CAPILLARY
Glucose-Capillary: 156 mg/dL — ABNORMAL HIGH (ref 70–99)
Glucose-Capillary: 160 mg/dL — ABNORMAL HIGH (ref 70–99)
Glucose-Capillary: 163 mg/dL — ABNORMAL HIGH (ref 70–99)
Glucose-Capillary: 138 mg/dL — ABNORMAL HIGH (ref 70–99)

## 2019-06-28 MED ORDER — MAGNESIUM OXIDE 400 (241.3 MG) MG PO TABS: 400.0000 mg | ORAL_TABLET | Freq: Every day | ORAL | 0 refills | Status: AC

## 2019-06-28 MED ORDER — MAGNESIUM OXIDE 400 (241.3 MG) MG PO TABS
400.0000 mg | ORAL_TABLET | Freq: Every day | ORAL | Status: DC
  Administered 2019-06-28: 400 mg via ORAL
  Filled 2019-06-28: qty 1

## 2019-06-28 MED ORDER — CYANOCOBALAMIN 1000 MCG/ML IJ SOLN
1000.0000 ug | Freq: Once | INTRAMUSCULAR | Status: AC
  Administered 2019-06-28: 19:00:00 1000 ug via INTRAMUSCULAR
  Filled 2019-06-28: qty 1
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
## 2019-06-28 NOTE — Progress Notes (Signed)
CSW received phone call from [facility] regarding patient authorization. [employer] rep explains that patient did not currently meet criteria for authorization. Patient is eligible for a peer to peer interview.  CSW made MD aware of peer to peer interview that is requested. TOC team will continue to follow patient for discharge related needs  [place] [facility]  Clinical Social Worker  Ph: [phone]

## 2019-06-28 NOTE — Progress Notes (Signed)
Physical Therapy Treatment Patient Details Name: [name] [name] MRN: [id number] DOB: 05-Feb-1956 Today's Date: 06/28/2019    History of Present Illness [name] [name]  is a 64 y.o. female  with history of morbid obesity, atrial fibrillation on Eliquis, CAD, COPD, diabetes mellitus type 2 on insulin therapy, [name] [name], history of thyroid cancer, hypothyroidism, hypertension recently discharged after treatment for cellulitis and Pseudomonas UTI presents to the ED with fatigue    PT Comments    Patient able to complete bed mobility and transfers with supervision and use of RW. Patient able to ambulate today with use of RW and becomes SOB while ambulating secondary to impaired activity tolerance. She continues to remain a fall risk secondary to impaired LE strength. She is able to complete exercises in chair with verbal cueing for positioning and mechanics. Patient will benefit from continued physical therapy in hospital and recommended venue below to increase strength, balance, endurance for safe ADLs and gait.     Follow Up Recommendations  SNF     Equipment Recommendations  None recommended by PT    Recommendations for Other Services       Precautions / Restrictions Precautions Precautions: Fall Restrictions Weight Bearing Restrictions: No    Mobility  Bed Mobility Overal bed mobility: Needs Assistance Bed Mobility: Supine to Sit     Supine to sit: Supervision;HOB elevated     General bed mobility comments: increased time, labored movement  Transfers Overall transfer level: Needs assistance Equipment used: Rolling walker (2 wheeled) Transfers: Sit to/from [facility] Sit to Stand: Supervision Stand pivot transfers: Supervision       General transfer comment: using RW; x2 from bed  Ambulation/Gait Ambulation/Gait assistance: Supervision Gait Distance (Feet): 120 Feet Assistive device: Rolling walker (2 wheeled) Gait Pattern/deviations: Decreased step  length - right;Decreased step length - left;Decreased stride length;Step-through pattern Gait velocity: decreased   General Gait Details: slightly labored cadence without loss of balance, limited secondary to fatigue, becomes minimally SOB   Stairs             Wheelchair Mobility    Modified Rankin (Stroke Patients Only)       Balance Overall balance assessment: Needs assistance Sitting-balance support: Feet supported;No upper extremity supported Sitting balance-Leahy Scale: Good Sitting balance - Comments: seated at EOB   Standing balance support: During functional activity;Bilateral upper extremity supported Standing balance-Leahy Scale: Fair Standing balance comment: using RW                            Cognition Arousal/Alertness: Awake/alert Behavior During Therapy: WFL for tasks assessed/performed Overall Cognitive Status: Within Functional Limits for tasks assessed                                        Exercises General Exercises - Lower Extremity Ankle Circles/Pumps: AROM;Both;20 reps;Seated Long Arc Quad: AROM;Both;Seated;20 reps Hip Flexion/Marching: AROM;Both;20 reps;Seated Toe Raises: AROM;Both;20 reps;Seated Heel Raises: AROM;20 reps;Both;Seated    General Comments        Pertinent Vitals/Pain      Home Living                      Prior Function            PT Goals (current goals can now be found in the care plan section) Acute Rehab PT Goals Patient Stated  Goal: go to rehab and return home PT Goal Formulation: With patient Time For Goal Achievement: 07/05/19 Potential to Achieve Goals: Fair Progress towards PT goals: Progressing toward goals    Frequency    Min 3X/week      PT Plan Current plan remains appropriate    Co-evaluation              AM-PAC PT "6 Clicks" Mobility   Outcome Measure  Help needed turning from your back to your side while in a flat bed without using  bedrails?: None Help needed moving from lying on your back to sitting on the side of a flat bed without using bedrails?: A Little Help needed moving to and from a bed to a chair (including a wheelchair)?: A Little Help needed standing up from a chair using your arms (e.g., wheelchair or bedside chair)?: A Little Help needed to walk in hospital room?: A Little Help needed climbing 3-5 steps with a railing? : A Lot 6 Click Score: 18    End of Session Equipment Utilized During Treatment: Gait belt Activity Tolerance: Patient tolerated treatment well Patient left: in chair Nurse Communication: Mobility status PT Visit Diagnosis: Unsteadiness on feet (R26.81);Other abnormalities of gait and mobility (R26.89);Muscle weakness (generalized) (M62.81);History of falling (Z91.81)     Time: 5146-0479 PT Time Calculation (min) (ACUTE ONLY): 15 min  Charges:  $Therapeutic Exercise: 8-22 mins                     12:31 PM, 06/28/19 Mearl Latin PT, DPT Physical Therapist at [facility]
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
## 2019-06-28 NOTE — Progress Notes (Addendum)
*  Documented on wrong chart*   [place] [facility]  Clinical Social Worker  Ph: [phone]

## 2019-06-28 NOTE — Care Management Important Message (Signed)
Important Message  Patient Details  Name: [name] [name] MRN: [id number] Date of Birth: 1956-05-18   Medicare Important Message Given:  Rogue Jury, RN will deliver letter to patient)     Tommy Medal 06/28/2019, 3:10 PM

## 2019-06-29 LAB — GLUCOSE, CAPILLARY
Glucose-Capillary: 207 mg/dL — ABNORMAL HIGH (ref 70–99)
Glucose-Capillary: 142 mg/dL — ABNORMAL HIGH (ref 70–99)

## 2019-06-29 NOTE — TOC Transition Note (Addendum)
Transition of Care [facility]) - CM/SW Discharge Note   Patient Details  Name: [name] [name] MRN: [id number] Date of Birth: [birth year]  Transition of Care [facility]) CM/SW Contact:  Ihor Gully, LCSW Phone Number: 06/29/2019, 1:15 PM   Clinical Narrative:    Per patient, her friend, Vernia Buff, is coming to pick her up from discharge. Discussed [facility] declining patient for PT.  Offered OPPT. Patient declined OPPT and advised if she wanted it she would call back. She stated that the physical therapist had taught her the exercises and that she was capable of doing them independently. OPPT orders placed by attending in the event that patient reconsiders OPPT.  Message sent to [name] at [place] advising of d/c TOC signing off.       Barriers to Discharge: SNF Authorization Denied   Patient Goals and CMS Choice Patient states their goals for this hospitalization and ongoing recovery are:: to return home after SNF. CMS Medicare.gov Compare Post Acute Care list provided to:: Patient Choice offered to / list presented to : Patient  Discharge Placement                       Discharge Plan and Services                                     Social Determinants of Health (SDOH) Interventions     Readmission Risk Interventions Readmission Risk Prevention Plan 06/21/2019  Transportation Screening Not Complete  Medication Review Press photographer) Complete  PCP or Specialist appointment within 3-5 days of discharge Not Complete  HRI or Home Care Consult Complete  SW Recovery Care/Counseling Consult Complete  Palliative Care Screening Not Complete  Skilled Nursing Facility Complete  Some recent data might be hidden
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
## 2019-06-29 NOTE — Discharge Summary (Addendum)
-  Please see full discharge summary dictated 06/26/2019 and addendum to discharge summary on 06/29/2019   ---Please see full discharge summary initially dictated on 06/26/2019 and updated/addended on 06/29/2019  Roxan Hockey, MD

## 2019-06-29 NOTE — Progress Notes (Signed)
Discharge instructions reviewed , scripts sent to pharmacy and paper script for norco in packet.  Teaching complete concerning sliding scale insulin.  No IV to remove. Home health being arranged.  Friend to drive home
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
## 2019-07-01 ENCOUNTER — Telehealth: Payer: Self-pay | Admitting: Emergency Medicine

## 2019-07-05 ENCOUNTER — Telehealth: Payer: Self-pay | Admitting: *Deleted

## 2019-07-08 DIAGNOSIS — E876 Hypokalemia: Secondary | ICD-10-CM | POA: Diagnosis not present

## 2019-07-08 DIAGNOSIS — Z681 Body mass index (BMI) 19 or less, adult: Secondary | ICD-10-CM | POA: Diagnosis not present

## 2019-07-08 DIAGNOSIS — E860 Dehydration: Secondary | ICD-10-CM | POA: Diagnosis not present

## 2019-07-10 ENCOUNTER — Ambulatory Visit (HOSPITAL_COMMUNITY): Payer: PPO | Attending: Family Medicine | Admitting: Physical Therapy

## 2019-07-10 ENCOUNTER — Other Ambulatory Visit: Payer: Self-pay

## 2019-07-10 ENCOUNTER — Encounter (HOSPITAL_COMMUNITY): Payer: Self-pay | Admitting: Physical Therapy

## 2019-07-10 DIAGNOSIS — R2689 Other abnormalities of gait and mobility: Secondary | ICD-10-CM | POA: Insufficient documentation

## 2019-07-10 DIAGNOSIS — M6281 Muscle weakness (generalized): Secondary | ICD-10-CM | POA: Insufficient documentation

## 2019-07-10 NOTE — Therapy (Signed)
[place], [place], [address] Phone: [phone]   Fax:  [phone]  Physical Therapy Evaluation  Patient Details  Name: [name] [name] MRN: [id number] Date of Birth: [birth year] Referring Provider (PT): Roxan Hockey MD   Encounter Date: 07/10/2019  PT End of Session - 07/10/19 1106    Visit Number  1    Number of Visits  12    Date for PT Re-Evaluation  08/23/19   mini reasses 08/02/19   Authorization Type  [employer] (no auth, no visit limit)    Authorization Time Period  07/10/19-08/23/19    PT Start Time  1030    PT Stop Time  1110    PT Time Calculation (min)  40 min    Equipment Utilized During Treatment  Gait belt   RW   Activity Tolerance  Patient tolerated treatment well;Patient limited by fatigue    Behavior During Therapy  [facility] for tasks assessed/performed       Past Medical History:  Diagnosis Date  . Anemia   . Anxiety   . Arthritis   . Asthma   . Atrial fibrillation ([place])   . Cirrhosis of liver ([place])   . COPD (chronic obstructive pulmonary disease) ([place])   . Coronary artery disease   . Depression   . Edema   . Essential hypertension, benign   . GERD (gastroesophageal reflux disease)   . Hypothyroidism   . Morbid obesity ([place])   . Overactive bladder   . Sleep apnea    CPAP - not consistent  . Thyroid cancer ([place])   . Type 2 diabetes mellitus ([place])     Past Surgical History:  Procedure Laterality Date  . "pump bumps"     bilateral heels--2000  . ABDOMINAL HYSTERECTOMY  1985  . CARDIAC CATHETERIZATION  2012   Dr. Lia Foyer told her nothing was wrong.  [name] [place]  . CHOLECYSTECTOMY  1996  . COLONOSCOPY WITH PROPOFOL N/A 04/17/2015   Procedure: COLONOSCOPY WITH PROPOFOL  at cecum at 0810; withdrawal time =15 minutes;  Surgeon: [name], MD;  Location: [facility];  Service: Endoscopy;  Laterality: N/A;  . Debridement of abdominal wound  2011  . ESOPHAGEAL  DILATION N/A 04/17/2015   Procedure: ESOPHAGEAL DILATION WITH 56FR MALONEY DILATOR;  Surgeon: [name], MD;  Location: [facility];  Service: Endoscopy;  Laterality: N/A;  . ESOPHAGOGASTRODUODENOSCOPY (EGD) WITH PROPOFOL N/A 04/17/2015   Procedure: ESOPHAGOGASTRODUODENOSCOPY (EGD) WITH PROPOFOL;  Surgeon: [name], MD;  Location: [facility];  Service: Endoscopy;  Laterality: N/A;  . EXPLORATORY LAPAROTOMY  2003  . FOOT SURGERY Bilateral 2000   Bone spur removed  . FOOT SURGERY    . INCISIONAL HERNIA REPAIR N/A 06/09/2014   Procedure: RECURRENT  INCISIONAL HERNIORRHAPHY WITH MESH;  Surgeon: [name], MD;  Location: [facility];  Service: General;  Laterality: N/A;  . INCISIONAL HERNIA REPAIR N/A 10/15/2014   Procedure: [place];  Surgeon: [name], MD;  Location: [place];  Service: General;  Laterality: N/A;  . INSERTION OF MESH N/A 06/09/2014   Procedure: INSERTION OF MESH;  Surgeon: [name], MD;  Location: [facility];  Service: General;  Laterality: N/A;  . INSERTION OF MESH N/A 10/15/2014   Procedure: INSERTION OF MESH;  Surgeon: [name], MD;  Location: [place];  Service: General;  Laterality: N/A;  . POLYPECTOMY  04/17/2015   Procedure: POLYPECTOMY;  Surgeon: Bernadene Person  Gloriann Loan, MD;  Location: [facility];  Service: Endoscopy;;  . RESECTION DISTAL CLAVICAL  05/07/2012   Procedure: RESECTION DISTAL CLAVICAL;  Surgeon: [name], MD;  Location: [facility];  Service: Orthopedics;  Laterality: Right;  . SHOULDER OPEN ROTATOR CUFF REPAIR  05/07/2012   Procedure: ROTATOR CUFF REPAIR SHOULDER OPEN;  Surgeon: [name], MD;  Location[facility];  Service: Orthopedics;  Laterality: Right;  . TEE WITHOUT CARDIOVERSION N/A 12/26/2018   Procedure: TRANSESOPHAGEAL ECHOCARDIOGRAM (TEE) WITH PROPOFOL;  Surgeon: Arnoldo Lenis, MD;  [facility];  Service: Endoscopy;  Laterality: N/A;  . THYROIDECTOMY  2009  . TONSILLECTOMY  1958  .  UMBILICAL HERNIA REPAIR  2010    There were no vitals filed for this visit.   Subjective Assessment - 07/10/19 1039    Subjective  Patient presents to physical therapy with complaint of generalized weakness. Patient says she was in the hospital from 12/31 to 1/9 due to low potassium. Patient says she was very weak and never fully recovered her strength. Patient also reports history of frequent falls due to weakness.    Limitations  Lifting;Standing;Walking;House hold activities    How long can you stand comfortably?  <5 minutes    How long can you walk comfortably?  <5 minutes    Patient Stated Goals  get back where I can feel better and do some stuff    Currently in Pain?  Yes    Pain Score  6     Pain Location  Knee    Pain Orientation  Right;Left    Pain Descriptors / Indicators  Sharp    Pain Type  Chronic pain    Pain Onset  More than a month ago    Pain Frequency  Constant    Aggravating Factors   standing, walking, bending, lifting    Pain Relieving Factors  meds    Effect of Pain on Daily Activities  Limits         [facility] PT Assessment - 07/10/19 0001      Assessment   Medical Diagnosis  Generalized Weakness    Referring Provider (PT)  Roxan Hockey MD    Onset Date/Surgical Date  06/20/19    Prior Therapy  Yes      Precautions   Precautions  Fall      Restrictions   Weight Bearing Restrictions  No      Balance Screen   Has the patient fallen in the past 6 months  Yes    How many times?  3    Has the patient had a decrease in activity level because of a fear of falling?   Yes    Is the patient reluctant to leave their home because of a fear of falling?   Yes   unless she has someone to assist her      New [place]  One level    Additional Comments  Has several people to assist with cleaning home, and community  ADLs, cousing, Tour manager, friend       Prior Function   Level of Independence  Independent with household mobility with device   uses quad cane    Vocation  Retired      Associate Professor   Overall Cognitive Status  Within Functional Limits for tasks assessed      ROM / Strength   AROM / PROM / Strength  AROM      AROM   Overall AROM Comments  Bilateral hip and knee AROM WFL       Strength   Overall Strength Comments  --   BLE strength tested in seated position   Right Hip Flexion  4-/5    Right Hip Extension  4-/5    Right Hip ABduction  4/5    Left Hip Flexion  4/5    Left Hip Extension  4+/5    Left Hip ABduction  4/5    Right Knee Flexion  4/5   Discomfort    Right Knee Extension  4/5   Discomfort   Left Knee Flexion  4+/5    Left Knee Extension  5/5    Right/Left Ankle  Right;Left    Right Ankle Dorsiflexion  4/5    Left Ankle Dorsiflexion  4+/5      Transfers   Five time sit to stand comments   19.3 sec with use of arms, LT UE push from arm rest       Ambulation/Gait   Ambulation/Gait  Yes    Ambulation/Gait Assistance  5: Supervision    Ambulation Distance (Feet)  180 Feet    Assistive device  Rolling walker    Gait Pattern  Decreased stride length;Decreased weight shift to right;Antalgic;Trunk flexed    Ambulation Surface  Level;Indoor    Gait velocity  decreased    Gait Comments  2MWT      Balance   Balance Assessed  Yes      Static Standing Balance   Static Standing Balance -  Activities   Tandam Stance - Right Leg;Tandam Stance - Left Leg    Static Standing - Comment/# of Minutes  3 sec; 4 sec                Objective measurements completed on examination: See above findings.              PT Education - 07/10/19 1042    Education Details  Patient educated on evaluation findings, POC and HEP    Person(s) Educated  Patient    Methods  Explanation    Comprehension  Verbalized understanding       PT Short Term Goals - 07/10/19  1109      PT SHORT TERM GOAL #1   Title  Patient will be independent with initial HEP to improve functional outcomes    Time  3    Period  Weeks    Status  New    Target Date  08/02/19        PT Long Term Goals - 07/10/19 1109      PT LONG TERM GOAL #1   Title  Patient will be able to perform stand x 5 in < 15 seconds to demonstrate improvement in functional mobility and reduced risk for falls.    Time  6    Period  Weeks    Status  New    Target Date  08/23/19      PT LONG TERM GOAL #2   Title  Patient will be able to maintain tandem stance >30 seconds on BLEs to improve stability and reduce risk for falls    Time  6    Period  Weeks    Status  New    Target Date  08/23/19  PT LONG TERM GOAL #3   Title  Patient will be able to ambulate at least 275 feet during 2MWT with LRAD to demonstrate improved ability to perform functional mobility and associated tasks.    Time  6    Period  Weeks    Status  New    Target Date  08/23/19      PT LONG TERM GOAL #4   Title  Patient will report at least 60% overall improvement in subjective complaint to indicate improvement in ability to perform ADLs.    Time  6    Period  Weeks    Status  New    Target Date  08/23/19             Plan - 07/10/19 1107    Clinical Impression Statement  Patient is a 64 y.o. female who presents to physical therapy with complaint of generalized weakness and history of falls. Patient demonstrates decreased strength, balance deficits and gait abnormalities which are negatively impacting patient ability to perform ADLs and functional mobility tasks. Patient will benefit from skilled physical therapy services to address these deficits to improve level of function with ADLs, functional mobility tasks, and reduce risk for falls.    Personal Factors and Comorbidities  Age;Comorbidity 3+;Fitness    Comorbidities  DM, COPD, HTN    Examination-Activity Limitations  Continence;Stairs;Locomotion  Level;Squat;Lift    Examination-Participation Restrictions  Cleaning;Community Activity    Stability/Clinical Decision Making  Stable/Uncomplicated    Rehab Potential  Good    PT Frequency  2x / week    PT Duration  6 weeks    PT Treatment/Interventions  ADLs/Self Care Home Management;Aquatic Therapy;Cryotherapy;Electrical Stimulation;Moist Heat;Gait training;Stair training;Functional mobility training;DME Instruction;Therapeutic activities;Therapeutic exercise;Balance training;Neuromuscular re-education;Patient/family education;Manual techniques;Joint Manipulations;Passive range of motion;Energy conservation;Splinting;Taping    PT Next Visit Plan  Review goals, progress endurance, gait, balance, and BLE strength as tolerated. Add standing balance, sit to stands, step ups.    PT Home Exercise Plan  07/10/19: seated marching, LAQ, heel/ toe raises seated    Consulted and Agree with Plan of Care  Patient       Patient will benefit from skilled therapeutic intervention in order to improve the following deficits and impairments:  Abnormal gait, Decreased balance, Decreased mobility, Decreased skin integrity, Difficulty walking, Obesity, Decreased strength, Decreased activity tolerance, Decreased safety awareness, Decreased knowledge of use of DME, Increased edema, Improper body mechanics  Visit Diagnosis: Muscle weakness (generalized)  Other abnormalities of gait and mobility     Problem List Patient Active Problem List   Diagnosis Date Noted  . Acute lower UTI 06/22/2019  . Hypokalemia 06/20/2019  . AKI (acute kidney injury) ([place]) 06/01/2019  . NASH (nonalcoholic steatohepatitis) 06/01/2019  . Pressure injury of skin 05/31/2019  . Candidiasis, intertrigo 05/31/2019  . Yeast infection 05/31/2019  . Fever 12/24/2018  . Hyponatremia 12/24/2018  . Abnormal liver function 12/24/2018  . Sepsis due to undetermined organism ([place]) 11/07/2018  . Atrial fibrillation ([place]) 11/07/2018  . COPD  (chronic obstructive pulmonary disease) ([place]) 11/07/2018  . GERD (gastroesophageal reflux disease) 11/07/2018  . Hypothyroidism 11/07/2018  . Sleep apnea 11/07/2018  . Hepatic cirrhosis ([place]) 09/01/2014  . Cirrhosis, nonalcoholic ([place]) 16/03/9603  . Incisional hernia 06/09/2014  . S/P complete repair of rotator cuff 07/24/2012  . Precordial pain 09/30/2010  . Essential hypertension, benign 09/30/2010  . Type 2 diabetes mellitus ([place]) 09/05/2008  . OBESITY-MORBID (>100') 09/05/2008  . ESOPHAGEAL REFLUX 09/05/2008  . EDEMA 09/05/2008   11:14  AM, 07/10/19 Josue Hector PT DPT  Physical Therapist with [facility]  [phone]   [facility] [address] [place], [place], [address] Phone: [phone]   Fax:  [phone]  Name: MERRI DIMAANO MRN: [id number] Date of Birth: 05/02/1956

## 2019-07-11 ENCOUNTER — Telehealth (HOSPITAL_COMMUNITY): Payer: Self-pay | Admitting: Physical Therapy

## 2019-07-11 NOTE — Telephone Encounter (Signed)
pt canncelled appt for 07/15/19 because the transportation was full

## 2019-07-15 ENCOUNTER — Ambulatory Visit (HOSPITAL_COMMUNITY): Payer: PPO | Admitting: Physical Therapy

## 2019-07-17 ENCOUNTER — Other Ambulatory Visit: Payer: Self-pay

## 2019-07-17 ENCOUNTER — Encounter (HOSPITAL_COMMUNITY): Payer: Self-pay | Admitting: Physical Therapy

## 2019-07-17 ENCOUNTER — Ambulatory Visit (HOSPITAL_COMMUNITY): Payer: PPO | Admitting: Physical Therapy

## 2019-07-17 DIAGNOSIS — M6281 Muscle weakness (generalized): Secondary | ICD-10-CM

## 2019-07-17 DIAGNOSIS — R2689 Other abnormalities of gait and mobility: Secondary | ICD-10-CM

## 2019-07-17 NOTE — Therapy (Signed)
[place], [place], [address] Phone: [phone]   Fax:  [phone]  Physical Therapy Treatment  Patient Details  Name: [name] [name] MRN: [id number] Date of Birth: 1955/09/11 Referring Provider ([name]): Roxan Hockey MD   Encounter Date: 07/17/2019  [name] End of Session - 07/17/19 1402    Visit Number  2    Number of Visits  12    Date for [name] Re-Evaluation  08/23/19   mini reasses 08/02/19   Authorization Type  [employer] (no auth, no visit limit)    Authorization Time Period  07/10/19-08/23/19    Authorization - Visit Number  2    Authorization - Number of Visits  10    [name] Start Time  1005    [name] Stop Time  1045    [name] Time Calculation (min)  40 min    Equipment Utilized During Treatment  Gait belt   RW   Activity Tolerance  Patient tolerated treatment well;Patient limited by fatigue    Behavior During Therapy  [facility] for tasks assessed/performed       Past Medical History:  Diagnosis Date  . Anemia   . Anxiety   . Arthritis   . Asthma   . Atrial fibrillation ([place])   . Cirrhosis of liver ([place])   . COPD (chronic obstructive pulmonary disease) ([place])   . Coronary artery disease   . Depression   . Edema   . Essential hypertension, benign   . GERD (gastroesophageal reflux disease)   . Hypothyroidism   . Morbid obesity ([place])   . Overactive bladder   . Sleep apnea    CPAP - not consistent  . Thyroid cancer ([place])   . Type 2 diabetes mellitus ([place])     Past Surgical History:  Procedure Laterality Date  . "pump bumps"     bilateral heels--2000  . ABDOMINAL HYSTERECTOMY  1985  . CARDIAC CATHETERIZATION  2012   Dr. Lia Foyer told her nothing was wrong.  [name] [place]  . CHOLECYSTECTOMY  1996  . COLONOSCOPY WITH PROPOFOL N/A 04/17/2015   Procedure: COLONOSCOPY WITH PROPOFOL  at cecum at 0810; withdrawal time =15 minutes;  Surgeon: [name], MD;  [facility];  Service:  Endoscopy;  Laterality: N/A;  . Debridement of abdominal wound  2011  . ESOPHAGEAL DILATION N/A 04/17/2015   Procedure: ESOPHAGEAL DILATION WITH 56FR MALONEY DILATOR;  Surgeon: [name], MD;  [facility]: [facility];  Service: Endoscopy;  Laterality: N/A;  . ESOPHAGOGASTRODUODENOSCOPY (EGD) WITH PROPOFOL N/A 04/17/2015   Procedure: ESOPHAGOGASTRODUODENOSCOPY (EGD) WITH PROPOFOL;  Surgeon: [name], MD;  [facility]: [facility];  Service: Endoscopy;  Laterality: N/A;  . EXPLORATORY LAPAROTOMY  2003  . FOOT SURGERY Bilateral 2000   Bone spur removed  . FOOT SURGERY    . INCISIONAL HERNIA REPAIR N/A 06/09/2014   Procedure: RECURRENT  INCISIONAL HERNIORRHAPHY WITH MESH;  Surgeon: [name], MD;  Location: [facility];  Service: General;  Laterality: N/A;  . INCISIONAL HERNIA REPAIR N/A 10/15/2014   Procedure: [place];  Surgeon: [name], MD;  Location: [place];  Service: General;  Laterality: N/A;  . INSERTION OF MESH N/A 06/09/2014   Procedure: INSERTION OF MESH;  Surgeon: [name], MD;  [facility];  Service: General;  Laterality: N/A;  . INSERTION OF MESH N/A 10/15/2014   Procedure: INSERTION OF MESH;  Surgeon: [name], MD;  [facility]  OR;  Service: General;  Laterality: N/A;  . POLYPECTOMY  04/17/2015   Procedure: POLYPECTOMY;  Surgeon: [name], MD;  [facility];  Service: Endoscopy;;  . RESECTION DISTAL CLAVICAL  05/07/2012   Procedure: RESECTION DISTAL CLAVICAL;  Surgeon: [name], MD;  [facility];  Service: Orthopedics;  Laterality: Right;  . SHOULDER OPEN ROTATOR CUFF REPAIR  05/07/2012   Procedure: ROTATOR CUFF REPAIR SHOULDER OPEN;  Surgeon: [name], MD;  [facility];  Service: Orthopedics;  Laterality: Right;  . TEE WITHOUT CARDIOVERSION N/A 12/26/2018   Procedure: TRANSESOPHAGEAL ECHOCARDIOGRAM (TEE) WITH PROPOFOL;  Surgeon: Arnoldo Lenis, MD;  [facility];  Service:  Endoscopy;  Laterality: N/A;  . THYROIDECTOMY  2009  . TONSILLECTOMY  1958  . UMBILICAL HERNIA REPAIR  2010    There were no vitals filed for this visit.  Subjective Assessment - 07/17/19 1401    Subjective  [name] states that she has no questions on her HEP.  She would really like to get back to walking without a cane.    Limitations  Lifting;Standing;Walking;House hold activities    How long can you stand comfortably?  <5 minutes    How long can you walk comfortably?  <5 minutes    Patient Stated Goals  get back where I can feel better and do some stuff    Currently in Pain?  No/denies    Pain Onset  More than a month ago          [facility] Adult [name] Treatment/Exercise - 07/17/19 0001      Exercises   Exercises  Lumbar      Lumbar Exercises: Standing   Other Standing Lumbar Exercises  stand completing 5 glut sets without UE asssist   [name] reaching due to lack of balance, fatiqued at end    Other Standing Lumbar Exercises  wall arch x 5      Lumbar Exercises: Seated   Sit to Stand  5 reps    Other Seated Lumbar Exercises  scapular retraction.       Lumbar Exercises: Supine   Ab Set  5 reps    AB Set Limitations  12-6; 2-7; 3-9; 5-11     Other Supine Lumbar Exercises  diaphragmic breathing, self massage to abdominal area to improve lymphatic flow.              [name] Education - 07/17/19 1050    Education Details  to wear better shoes to therapy, to be wearing an abdominal binder.    Person(s) Educated  Patient    Methods  Explanation    Comprehension  Verbalized understanding       [name] Short Term Goals - 07/17/19 1035      [name] SHORT TERM GOAL #1   Title  Patient will be independent with initial HEP to improve functional outcomes    Time  3    Period  Weeks    Status  On-going    Target Date  08/02/19        [name] Long Term Goals - 07/17/19 1035      [name] LONG TERM GOAL #1   Title  Patient will be able to perform stand x 5 in < 15 seconds to demonstrate improvement in  functional mobility and reduced risk for falls.    Time  6    Period  Weeks    Status  On-going      [name] LONG TERM  GOAL #2   Title  Patient will be able to maintain tandem stance >30 seconds on BLEs to improve stability and reduce risk for falls    Time  6    Period  Weeks    Status  On-going      [name] LONG TERM GOAL #3   Title  Patient will be able to ambulate at least 275 feet during 2MWT with LRAD to demonstrate improved ability to perform functional mobility and associated tasks.    Time  6    Period  Weeks    Status  On-going      [name] LONG TERM GOAL #4   Title  Patient will report at least 60% overall improvement in subjective complaint to indicate improvement in ability to perform ADLs.    Time  6    Period  Weeks    Status  On-going            Plan - 07/17/19 1402    Clinical Impression Statement  [name] with noted postural dysfunction which will cause decreased balance as well as difficulty breathing.  [name] needing to take a break with wall arches as well as with sit to stand due to mm fatigue and SOB>  Therapist insturcted [name] in the importance of diapharagmic breathing and that is is never to late to improve your posture.  HEP given to assist with both.  [name] has noted lymphedema in abdominal area counsled [name] on self manual techniques as well as the benefits of obtaining an abdominal brace.    Personal Factors and Comorbidities  Age;Comorbidity 3+;Fitness    Comorbidities  DM, COPD, HTN    Examination-Activity Limitations  Continence;Stairs;Locomotion Level;Squat;Lift    Examination-Participation Restrictions  Cleaning;Community Activity    Stability/Clinical Decision Making  Stable/Uncomplicated    Rehab Potential  Good    [name] Frequency  2x / week    [name] Duration  6 weeks    [name] Treatment/Interventions  ADLs/Self Care Home Management;Aquatic Therapy;Cryotherapy;Electrical Stimulation;Moist Heat;Gait training;Stair training;Functional mobility training;DME Instruction;Therapeutic  activities;Therapeutic exercise;Balance training;Neuromuscular re-education;Patient/family education;Manual techniques;Joint Manipulations;Passive range of motion;Energy conservation;Splinting;Taping    [name] Next Visit Plan  Continue postural exercises as able add standing balance, , step ups.    [name] Home Exercise Plan  07/10/19: seated marching, LAQ, heel/ toe raises seated; 1/27:  diapharagmic breathing, scapular retraction, abdominal isometric( round the clock), self manual to decrease abdominal lymphatic volume, sit to stand.    Consulted and Agree with Plan of Care  Patient       Patient will benefit from skilled therapeutic intervention in order to improve the following deficits and impairments:  Abnormal gait, Decreased balance, Decreased mobility, Decreased skin integrity, Difficulty walking, Obesity, Decreased strength, Decreased activity tolerance, Decreased safety awareness, Decreased knowledge of use of DME, Increased edema, Improper body mechanics  Visit Diagnosis: Other abnormalities of gait and mobility  Muscle weakness (generalized)     Problem List Patient Active Problem List   Diagnosis Date Noted  . Acute lower UTI 06/22/2019  . Hypokalemia 06/20/2019  . AKI (acute kidney injury) ([place]) 06/01/2019  . NASH (nonalcoholic steatohepatitis) 06/01/2019  . Pressure injury of skin 05/31/2019  . Candidiasis, intertrigo 05/31/2019  . Yeast infection 05/31/2019  . Fever 12/24/2018  . Hyponatremia 12/24/2018  . Abnormal liver function 12/24/2018  . Sepsis due to undetermined organism ([place]) 11/07/2018  . Atrial fibrillation ([place]) 11/07/2018  . COPD (chronic obstructive pulmonary disease) ([place]) 11/07/2018  . GERD (gastroesophageal reflux disease) 11/07/2018  . Hypothyroidism 11/07/2018  .  Sleep apnea 11/07/2018  . Hepatic cirrhosis ([place]) 09/01/2014  . Cirrhosis, nonalcoholic ([place]) 15/40/0867  . Incisional hernia 06/09/2014  . S/P complete repair of rotator cuff 07/24/2012  .  Precordial pain 09/30/2010  . Essential hypertension, benign 09/30/2010  . Type 2 diabetes mellitus ([place]) 09/05/2008  . OBESITY-MORBID (>100') 09/05/2008  . ESOPHAGEAL REFLUX 09/05/2008  . EDEMA 09/05/2008   [name] [name], [name] [name] [phone] 07/17/2019, 2:10 PM  [place] [address]  [place], [place], [address] Phone: [phone]   Fax:  [phone]  Name: [name] [name] MRN: [id number] Date of Birth: December 29, 1955

## 2019-07-21 DIAGNOSIS — F321 Major depressive disorder, single episode, moderate: Secondary | ICD-10-CM | POA: Diagnosis not present

## 2019-07-21 DIAGNOSIS — E1140 Type 2 diabetes mellitus with diabetic neuropathy, unspecified: Secondary | ICD-10-CM | POA: Diagnosis not present

## 2019-07-21 DIAGNOSIS — E063 Autoimmune thyroiditis: Secondary | ICD-10-CM | POA: Diagnosis not present

## 2019-07-21 DIAGNOSIS — M170 Bilateral primary osteoarthritis of knee: Secondary | ICD-10-CM | POA: Diagnosis not present

## 2019-07-23 ENCOUNTER — Telehealth (HOSPITAL_COMMUNITY): Payer: Self-pay

## 2019-07-23 ENCOUNTER — Ambulatory Visit (HOSPITAL_COMMUNITY): Payer: PPO

## 2019-07-23 NOTE — Telephone Encounter (Signed)
the pt called to cancel today's appt due to she has no heat at home and is waiting for the repair man to come over

## 2019-07-26 ENCOUNTER — Other Ambulatory Visit: Payer: Self-pay

## 2019-07-26 ENCOUNTER — Ambulatory Visit (HOSPITAL_COMMUNITY): Payer: PPO | Attending: Family Medicine

## 2019-07-26 ENCOUNTER — Encounter (HOSPITAL_COMMUNITY): Payer: Self-pay

## 2019-07-26 DIAGNOSIS — M6281 Muscle weakness (generalized): Secondary | ICD-10-CM | POA: Diagnosis not present

## 2019-07-26 DIAGNOSIS — R2689 Other abnormalities of gait and mobility: Secondary | ICD-10-CM | POA: Insufficient documentation

## 2019-07-26 NOTE — Therapy (Signed)
[place], [place], [address] Phone: [phone]   Fax:  [phone]  Physical Therapy Treatment  Patient Details  Name: [name] [name] MRN: [id number] Date of Birth: 06/03/1956 Referring Provider (PT): Roxan Hockey MD   Encounter Date: 07/26/2019  PT End of Session - 07/26/19 0917    Visit Number  3    Number of Visits  12    Date for PT Re-Evaluation  08/23/19   mini reasses 08/02/19   Authorization Type  [employer] (no auth, no visit limit)    Authorization Time Period  07/10/19-08/23/19    Authorization - Visit Number  3    Authorization - Number of Visits  10    PT Start Time  0917    PT Stop Time  1000    PT Time Calculation (min)  43 min    Equipment Utilized During Treatment  --   [facility]   Activity Tolerance  Patient tolerated treatment well;Patient limited by fatigue;Patient limited by pain    Behavior During Therapy  [facility] for tasks assessed/performed       Past Medical History:  Diagnosis Date  . Anemia   . Anxiety   . Arthritis   . Asthma   . Atrial fibrillation ([place])   . Cirrhosis of liver ([place])   . COPD (chronic obstructive pulmonary disease) ([place])   . Coronary artery disease   . Depression   . Edema   . Essential hypertension, benign   . GERD (gastroesophageal reflux disease)   . Hypothyroidism   . Morbid obesity ([place])   . Overactive bladder   . Sleep apnea    CPAP - not consistent  . Thyroid cancer ([place])   . Type 2 diabetes mellitus ([place])     Past Surgical History:  Procedure Laterality Date  . "pump bumps"     bilateral heels--2000  . ABDOMINAL HYSTERECTOMY  1985  . CARDIAC CATHETERIZATION  2012   Dr. Lia Foyer told her nothing was wrong.  [name] [place]  . CHOLECYSTECTOMY  1996  . COLONOSCOPY WITH PROPOFOL N/A 04/17/2015   Procedure: COLONOSCOPY WITH PROPOFOL  at cecum at 0810; withdrawal time =15 minutes;  Surgeon: [name], MD;  [facility];   Service: Endoscopy;  Laterality: N/A;  . Debridement of abdominal wound  2011  . ESOPHAGEAL DILATION N/A 04/17/2015   Procedure: ESOPHAGEAL DILATION WITH 56FR MALONEY DILATOR;  Surgeon: [name], MD;  [facility];  Service: Endoscopy;  Laterality: N/A;  . ESOPHAGOGASTRODUODENOSCOPY (EGD) WITH PROPOFOL N/A 04/17/2015   Procedure: ESOPHAGOGASTRODUODENOSCOPY (EGD) WITH PROPOFOL;  Surgeon: [name], MD;  [facility];  Service: Endoscopy;  Laterality: N/A;  . EXPLORATORY LAPAROTOMY  2003  . FOOT SURGERY Bilateral 2000   Bone spur removed  . FOOT SURGERY    . INCISIONAL HERNIA REPAIR N/A 06/09/2014   Procedure: RECURRENT  INCISIONAL HERNIORRHAPHY WITH MESH;  Surgeon: [name], MD;  Location: [facility];  Service: General;  Laterality: N/A;  . INCISIONAL HERNIA REPAIR N/A 10/15/2014   Procedure: [place];  Surgeon: [name], MD;  Location: [place];  Service: General;  Laterality: N/A;  . INSERTION OF MESH N/A 06/09/2014   Procedure: INSERTION OF MESH;  Surgeon: [name], MD;  [facility];  Service: General;  Laterality: N/A;  . INSERTION OF MESH N/A 10/15/2014   Procedure: INSERTION OF MESH;  Surgeon: [name], MD;  Location: [place] [facility];  Service: General;  Laterality: N/A;  . POLYPECTOMY  04/17/2015   Procedure: POLYPECTOMY;  Surgeon: [name], MD;  [facility];  Service: Endoscopy;;  . RESECTION DISTAL CLAVICAL  05/07/2012   Procedure: RESECTION DISTAL CLAVICAL;  Surgeon: [name], MD;  [facility]: [facility];  Service: Orthopedics;  Laterality: Right;  . SHOULDER OPEN ROTATOR CUFF REPAIR  05/07/2012   Procedure: ROTATOR CUFF REPAIR SHOULDER OPEN;  Surgeon: [name], MD;  [facility];  Service: Orthopedics;  Laterality: Right;  . TEE WITHOUT CARDIOVERSION N/A 12/26/2018   Procedure: TRANSESOPHAGEAL ECHOCARDIOGRAM (TEE) WITH PROPOFOL;  Surgeon: Arnoldo Lenis, MD;  [facility];   Service: Endoscopy;  Laterality: N/A;  . THYROIDECTOMY  2009  . TONSILLECTOMY  1958  . UMBILICAL HERNIA REPAIR  2010    There were no vitals filed for this visit.  Subjective Assessment - 07/26/19 0920    Subjective  Pt reports HEP is "going good". Pt reports she is starting to get a headache and her R knee is sore from standing long periods. Pt reports no new news.    Limitations  Lifting;Standing;Walking;House hold activities    How long can you stand comfortably?  <5 minutes    How long can you walk comfortably?  <5 minutes    Patient Stated Goals  get back where I can feel better and do some stuff    Currently in Pain?  No/denies    Pain Onset  More than a month ago            [facility] Adult PT Treatment/Exercise - 07/26/19 0001      Lumbar Exercises: Standing   Heel Raises  20 reps    Other Standing Lumbar Exercises  forward step ups, 4" step, BUE support, x10 reps BLE; standing hip abduction, // bars for UE support, x10 reps BLE    Other Standing Lumbar Exercises  flat foot taps to 4" step, intermittent BUE assist on // bars      Lumbar Exercises: Seated   Sit to Stand  10 reps    Sit to Stand Limitations  cues to push through bil flat feet      Lumbar Exercises: Supine   Ab Set  15 reps    AB Set Limitations  with exhale    Bridge  15 reps    Bridge with clamshell  15 reps    Bridge with [employer] Limitations  GTB      Lumbar Exercises: Sidelying   Clam  Both;10 reps             PT Education - 07/26/19 6568    Education Details  Exercise technique, continue HEP    Person(s) Educated  Patient    Methods  Explanation    Comprehension  Verbalized understanding       PT Short Term Goals - 07/17/19 1035      PT SHORT TERM GOAL #1   Title  Patient will be independent with initial HEP to improve functional outcomes    Time  3    Period  Weeks    Status  On-going    Target Date  08/02/19        PT Long Term Goals - 07/17/19 1035      PT LONG  TERM GOAL #1   Title  Patient will be able to perform stand x 5 in < 15 seconds to demonstrate improvement in functional mobility and  reduced risk for falls.    Time  6    Period  Weeks    Status  On-going      PT LONG TERM GOAL #2   Title  Patient will be able to maintain tandem stance >30 seconds on BLEs to improve stability and reduce risk for falls    Time  6    Period  Weeks    Status  On-going      PT LONG TERM GOAL #3   Title  Patient will be able to ambulate at least 275 feet during 2MWT with LRAD to demonstrate improved ability to perform functional mobility and associated tasks.    Time  6    Period  Weeks    Status  On-going      PT LONG TERM GOAL #4   Title  Patient will report at least 60% overall improvement in subjective complaint to indicate improvement in ability to perform ADLs.    Time  6    Period  Weeks    Status  On-going            Plan - 07/26/19 6503    Clinical Impression Statement  Patient presents to therapy in flip flops, but brings tennis shoes; therapists assists pt in donning prior to beginning therapy. Pt demonstrates moderate SOB with activity requiring therapeutic rest breaks. Began with standing BLE strengthening with balance challenges, but due to R knee pain in standing, began strengthening in supine position. Pt with cramp in R knee at EOS, requiring seated rest break and stretching to ease to allow pt to ambulate out of therapy gym; pt reports "normal" sensation upon standing and ambulating with [facility]. Continue to progress as able.    Personal Factors and Comorbidities  Age;Comorbidity 3+;Fitness    Comorbidities  DM, COPD, HTN    Examination-Activity Limitations  Continence;Stairs;Locomotion Level;Squat;Lift    Examination-Participation Restrictions  Cleaning;Community Activity    Stability/Clinical Decision Making  Stable/Uncomplicated    Rehab Potential  Good    PT Frequency  2x / week    PT Duration  6 weeks    PT  Treatment/Interventions  ADLs/Self Care Home Management;Aquatic Therapy;Cryotherapy;Electrical Stimulation;Moist Heat;Gait training;Stair training;Functional mobility training;DME Instruction;Therapeutic activities;Therapeutic exercise;Balance training;Neuromuscular re-education;Patient/family education;Manual techniques;Joint Manipulations;Passive range of motion;Energy conservation;Splinting;Taping    PT Next Visit Plan  Continue strengthening BLE, add standing exercises and balancea s able. Continue postural exercises. Gait training with appropriate AD.    PT Home Exercise Plan  07/10/19: seated marching, LAQ, heel/ toe raises seated; 1/27:  diapharagmic breathing, scapular retraction, abdominal isometric( round the clock), self manual to decrease abdominal lymphatic volume, sit to stand.    Consulted and Agree with Plan of Care  Patient       Patient will benefit from skilled therapeutic intervention in order to improve the following deficits and impairments:  Abnormal gait, Decreased balance, Decreased mobility, Decreased skin integrity, Difficulty walking, Obesity, Decreased strength, Decreased activity tolerance, Decreased safety awareness, Decreased knowledge of use of DME, Increased edema, Improper body mechanics  Visit Diagnosis: Muscle weakness (generalized)  Other abnormalities of gait and mobility     Problem List Patient Active Problem List   Diagnosis Date Noted  . Acute lower UTI 06/22/2019  . Hypokalemia 06/20/2019  . AKI (acute kidney injury) ([place]) 06/01/2019  . NASH (nonalcoholic steatohepatitis) 06/01/2019  . Pressure injury of skin 05/31/2019  . Candidiasis, intertrigo 05/31/2019  . Yeast infection 05/31/2019  . Fever 12/24/2018  . Hyponatremia 12/24/2018  . Abnormal  liver function 12/24/2018  . Sepsis due to undetermined organism ([place]) 11/07/2018  . Atrial fibrillation ([place]) 11/07/2018  . COPD (chronic obstructive pulmonary disease) ([place]) 11/07/2018  . GERD  (gastroesophageal reflux disease) 11/07/2018  . Hypothyroidism 11/07/2018  . Sleep apnea 11/07/2018  . Hepatic cirrhosis ([place]) 09/01/2014  . Cirrhosis, nonalcoholic ([place]) 98/61/4830  . Incisional hernia 06/09/2014  . S/P complete repair of rotator cuff 07/24/2012  . Precordial pain 09/30/2010  . Essential hypertension, benign 09/30/2010  . Type 2 diabetes mellitus ([place]) 09/05/2008  . OBESITY-MORBID (>100') 09/05/2008  . ESOPHAGEAL REFLUX 09/05/2008  . EDEMA 09/05/2008     Talbot Grumbling PT, DPT 07/26/19, 10:04 AM [place] [address] [place], [place], [address] Phone: [phone]   Fax:  [phone]  Name: [name] [name] MRN: [id number] Date of Birth: May 10, 1956

## 2019-07-29 ENCOUNTER — Ambulatory Visit (HOSPITAL_COMMUNITY): Payer: PPO | Admitting: Physical Therapy

## 2019-07-29 ENCOUNTER — Telehealth (HOSPITAL_COMMUNITY): Payer: Self-pay | Admitting: Physical Therapy

## 2019-07-29 NOTE — Telephone Encounter (Signed)
[facility] did not come to pick her up today so she cancelled her appt

## 2019-07-31 ENCOUNTER — Other Ambulatory Visit: Payer: Self-pay

## 2019-07-31 ENCOUNTER — Ambulatory Visit (HOSPITAL_COMMUNITY): Payer: PPO | Admitting: Physical Therapy

## 2019-07-31 ENCOUNTER — Encounter (HOSPITAL_COMMUNITY): Payer: Self-pay | Admitting: Physical Therapy

## 2019-07-31 DIAGNOSIS — R2689 Other abnormalities of gait and mobility: Secondary | ICD-10-CM

## 2019-07-31 DIAGNOSIS — M6281 Muscle weakness (generalized): Secondary | ICD-10-CM | POA: Diagnosis not present

## 2019-07-31 NOTE — Therapy (Signed)
[place], [place], [address] Phone: [phone]   Fax:  [phone]  Physical Therapy Treatment/ Progress Note   Patient Details  Name: [name] [name] MRN: [id number] Date of Birth: 14-Jun-1956 Referring Provider (PT): Roxan Hockey MD   Encounter Date: 07/31/2019   Progress Note Reporting Period 07/10/19 to 07/31/19 See note below for Objective Data and Assessment of Progress/Goals.       PT End of Session - 07/31/19 1043    Visit Number  4    Number of Visits  12    Date for PT Re-Evaluation  08/23/19   Progress note complete 07/31/19   Authorization Type  [employer] (no auth, no visit limit)    Authorization Time Period  07/10/19-08/23/19    Authorization - Visit Number  4    Authorization - Number of Visits  10    PT Start Time  1037    PT Stop Time  1115    PT Time Calculation (min)  38 min    Equipment Utilized During Treatment  --   [facility]   Activity Tolerance  Patient tolerated treatment well;Patient limited by fatigue;Patient limited by pain    Behavior During Therapy  WFL for tasks assessed/performed       Past Medical History:  Diagnosis Date  . Anemia   . Anxiety   . Arthritis   . Asthma   . Atrial fibrillation ([place])   . Cirrhosis of liver ([place])   . COPD (chronic obstructive pulmonary disease) ([place])   . Coronary artery disease   . Depression   . Edema   . Essential hypertension, benign   . GERD (gastroesophageal reflux disease)   . Hypothyroidism   . Morbid obesity ([place])   . Overactive bladder   . Sleep apnea    CPAP - not consistent  . Thyroid cancer ([place])   . Type 2 diabetes mellitus ([place])     Past Surgical History:  Procedure Laterality Date  . "pump bumps"     bilateral heels--2000  . ABDOMINAL HYSTERECTOMY  1985  . CARDIAC CATHETERIZATION  2012   Dr. Lia Foyer told her nothing was wrong.  [name] [place]  . CHOLECYSTECTOMY  1996  . COLONOSCOPY WITH  PROPOFOL N/A 04/17/2015   Procedure: COLONOSCOPY WITH PROPOFOL  at cecum at 0810; withdrawal time =15 minutes;  Surgeon: [name], MD;  [facility];  Service: Endoscopy;  Laterality: N/A;  . Debridement of abdominal wound  2011  . ESOPHAGEAL DILATION N/A 04/17/2015   Procedure: ESOPHAGEAL DILATION WITH 56FR MALONEY DILATOR;  Surgeon: [name], MD;  [facility];  Service: Endoscopy;  Laterality: N/A;  . ESOPHAGOGASTRODUODENOSCOPY (EGD) WITH PROPOFOL N/A 04/17/2015   Procedure: ESOPHAGOGASTRODUODENOSCOPY (EGD) WITH PROPOFOL;  Surgeon: [name], MD;  [facility]: [facility];  Service: Endoscopy;  Laterality: N/A;  . EXPLORATORY LAPAROTOMY  2003  . FOOT SURGERY Bilateral 2000   Bone spur removed  . FOOT SURGERY    . INCISIONAL HERNIA REPAIR N/A 06/09/2014   Procedure: RECURRENT  INCISIONAL HERNIORRHAPHY WITH MESH;  Surgeon: [name], MD;  Location: [facility];  Service: General;  Laterality: N/A;  . INCISIONAL HERNIA REPAIR N/A 10/15/2014   Procedure: [place];  Surgeon: [name], MD;  Location: [place];  Service: General;  Laterality: N/A;  . INSERTION OF MESH N/A 06/09/2014   Procedure: INSERTION OF MESH;  Surgeon: [name], MD;  [facility];  Service: General;  Laterality: N/A;  . INSERTION OF MESH N/A 10/15/2014   Procedure: INSERTION OF MESH;  Surgeon: [name], MD;  Location: [place];  Service: General;  Laterality: N/A;  . POLYPECTOMY  04/17/2015   Procedure: POLYPECTOMY;  Surgeon: [name], MD;  [facility];  Service: Endoscopy;;  . RESECTION DISTAL CLAVICAL  05/07/2012   Procedure: RESECTION DISTAL CLAVICAL;  Surgeon: [name], MD;  [facility];  Service: Orthopedics;  Laterality: Right;  . SHOULDER OPEN ROTATOR CUFF REPAIR  05/07/2012   Procedure: ROTATOR CUFF REPAIR SHOULDER OPEN;  Surgeon: [name], MD;  [facility];  Service: Orthopedics;  Laterality: Right;  . TEE  WITHOUT CARDIOVERSION N/A 12/26/2018   Procedure: TRANSESOPHAGEAL ECHOCARDIOGRAM (TEE) WITH PROPOFOL;  Surgeon: Arnoldo Lenis, MD;  [facility];  Service: Endoscopy;  Laterality: N/A;  . THYROIDECTOMY  2009  . TONSILLECTOMY  1958  . UMBILICAL HERNIA REPAIR  2010    There were no vitals filed for this visit.  Subjective Assessment - 07/31/19 1043    Subjective  Patient says she did not sleep much last night and was busy doing things on the computer. Patient says her RT knee has been bothering her a lot lately, "about a 6 or 7".    Limitations  Lifting;Standing;Walking;House hold activities    How long can you stand comfortably?  <5 minutes    How long can you walk comfortably?  <5 minutes    Patient Stated Goals  get back where I can feel better and do some stuff    Currently in Pain?  Yes    Pain Score  7     Pain Location  Knee    Pain Orientation  Right    Pain Descriptors / Indicators  Nagging    Pain Type  Chronic pain    Pain Onset  More than a month ago    Pain Frequency  Constant         [facility] PT Assessment - 07/31/19 0001      Assessment   Medical Diagnosis  Generalized Weakness    Referring Provider (PT)  Roxan Hockey MD    Onset Date/Surgical Date  06/20/19    Prior Therapy  Yes      Precautions   Precautions  Fall      Restrictions   Weight Bearing Restrictions  No      Balance Screen   Has the patient fallen in the past 6 months  Yes   no new falls since starting therapy    Has the patient had a decrease in activity level because of a fear of falling?   Yes    Is the patient reluctant to leave their home because of a fear of falling?   Yes      [place] residence    Living Arrangements  Alone    Type of [place]  One level    Additional Comments  Has several people to assist with cleaning home, and community ADLs, cousing, Tour manager, friend        Prior Function   Level of Independence  Independent with household mobility with device      Transfers   Five time sit to stand comments   14.12 sec with no UEs      Ambulation/Gait  Ambulation/Gait  Yes    Ambulation/Gait Assistance  6: Modified independent (Device/Increase time)    Ambulation Distance (Feet)  230 Feet    Assistive device  Rolling walker    Gait Pattern  Decreased stride length;Trunk flexed;Decreased step length - right;Decreased step length - left    Ambulation Surface  Level;Indoor    Gait Comments  2MWT      Static Standing Balance   Static Standing Balance -  Activities   Tandam Stance - Right Leg;Tandam Stance - Left Leg    Static Standing - Comment/# of Minutes  10 sec; 4 sec                    [facility] Adult PT Treatment/Exercise - 07/31/19 0001      Exercises   Exercises  Knee/Hip      Knee/Hip Exercises: Standing   Heel Raises  2 sets;10 reps    Hip Abduction  Both;2 sets;10 reps    Hip Extension  Both;2 sets;10 reps    Other Standing Knee Exercises  sidestepping at table, 5RT    Other Standing Knee Exercises  tandem stance, solid floor, 3 x 10 sec                PT Short Term Goals - 07/31/19 1100      PT SHORT TERM GOAL #1   Title  Patient will be independent with initial HEP to improve functional outcomes    Time  3    Period  Weeks    Status  Achieved    Target Date  08/02/19        PT Long Term Goals - 07/31/19 1101      PT LONG TERM GOAL #1   Title  Patient will be able to perform stand x 5 in < 15 seconds to demonstrate improvement in functional mobility and reduced risk for falls.    Baseline  Current: 14.1 sec with no UE    Time  6    Period  Weeks    Status  Achieved      PT LONG TERM GOAL #2   Title  Patient will be able to maintain tandem stance >30 seconds on BLEs to improve stability and reduce risk for falls    Time  6    Period  Weeks    Status  On-going      PT LONG TERM GOAL #3   Title   Patient will be able to ambulate at least 275 feet during 2MWT with LRAD to demonstrate improved ability to perform functional mobility and associated tasks.    Baseline  Current: 230 feet with RW    Time  6    Period  Weeks    Status  On-going      PT LONG TERM GOAL #4   Title  Patient will report at least 60% overall improvement in subjective complaint to indicate improvement in ability to perform ADLs.    Baseline  Current: 50%    Time  6    Period  Weeks    Status  On-going            Plan - 07/31/19 1112    Clinical Impression Statement  Patient is making good progress to LTGs. Patient with 1/1 STG and  LTGs currently met. Patient shows improvement in gait and functional mobility. Patient is still limited by decreased tolerance to functional activity, generalized weakness, and balance deficits, as well as knee pain,  which continue to negatively impact functional ability. Patient will continue to benefit form skilled therapy services to address remaining deficits to reduce pain and improve level of function with ADLs and functional mobility tasks and reduce risk of future falls.    Personal Factors and Comorbidities  Age;Comorbidity 3+;Fitness    Comorbidities  DM, COPD, HTN    Examination-Activity Limitations  Continence;Stairs;Locomotion Level;Squat;Lift    Examination-Participation Restrictions  Cleaning;Community Activity    Stability/Clinical Decision Making  Stable/Uncomplicated    Rehab Potential  Good    PT Frequency  2x / week    PT Duration  6 weeks    PT Treatment/Interventions  ADLs/Self Care Home Management;Aquatic Therapy;Cryotherapy;Electrical Stimulation;Moist Heat;Gait training;Stair training;Functional mobility training;DME Instruction;Therapeutic activities;Therapeutic exercise;Balance training;Neuromuscular re-education;Patient/family education;Manual techniques;Joint Manipulations;Passive range of motion;Energy conservation;Splinting;Taping    PT Next Visit  Plan  Continue strengthening BLE, add standing exercises and balance as able. Continue postural exercises. Gait training with appropriate AD.    PT Home Exercise Plan  07/10/19: seated marching, LAQ, heel/ toe raises seated; 1/27:  diapharagmic breathing, scapular retraction, abdominal isometric( round the clock), self manual to decrease abdominal lymphatic volume, sit to stand.    Consulted and Agree with Plan of Care  Patient       Patient will benefit from skilled therapeutic intervention in order to improve the following deficits and impairments:  Abnormal gait, Decreased balance, Decreased mobility, Decreased skin integrity, Difficulty walking, Obesity, Decreased strength, Decreased activity tolerance, Decreased safety awareness, Decreased knowledge of use of DME, Increased edema, Improper body mechanics  Visit Diagnosis: Muscle weakness (generalized)  Other abnormalities of gait and mobility     Problem List Patient Active Problem List   Diagnosis Date Noted  . Acute lower UTI 06/22/2019  . Hypokalemia 06/20/2019  . AKI (acute kidney injury) ([place]) 06/01/2019  . NASH (nonalcoholic steatohepatitis) 06/01/2019  . Pressure injury of skin 05/31/2019  . Candidiasis, intertrigo 05/31/2019  . Yeast infection 05/31/2019  . Fever 12/24/2018  . Hyponatremia 12/24/2018  . Abnormal liver function 12/24/2018  . Sepsis due to undetermined organism ([place]) 11/07/2018  . Atrial fibrillation ([place]) 11/07/2018  . COPD (chronic obstructive pulmonary disease) ([place]) 11/07/2018  . GERD (gastroesophageal reflux disease) 11/07/2018  . Hypothyroidism 11/07/2018  . Sleep apnea 11/07/2018  . Hepatic cirrhosis ([place]) 09/01/2014  . Cirrhosis, nonalcoholic ([place]) 29/57/4734  . Incisional hernia 06/09/2014  . S/P complete repair of rotator cuff 07/24/2012  . Precordial pain 09/30/2010  . Essential hypertension, benign 09/30/2010  . Type 2 diabetes mellitus ([place]) 09/05/2008  . OBESITY-MORBID (>100')  09/05/2008  . ESOPHAGEAL REFLUX 09/05/2008  . EDEMA 09/05/2008   11:17 AM, 07/31/19 Josue Hector PT DPT  Physical Therapist with [facility]  [phone] [place] [address] [place], [place], [address] Phone: [phone]   Fax:  [phone]  Name: [name] [name] MRN: [id number] Date of Birth: 06-09-1956

## 2019-08-05 ENCOUNTER — Ambulatory Visit (HOSPITAL_COMMUNITY): Payer: PPO | Admitting: Physical Therapy

## 2019-08-06 ENCOUNTER — Telehealth (HOSPITAL_COMMUNITY): Payer: Self-pay | Admitting: Physical Therapy

## 2019-08-06 DIAGNOSIS — Z743 Need for continuous supervision: Secondary | ICD-10-CM | POA: Diagnosis not present

## 2019-08-06 DIAGNOSIS — S79911A Unspecified injury of right hip, initial encounter: Secondary | ICD-10-CM | POA: Diagnosis not present

## 2019-08-06 DIAGNOSIS — S7001XA Contusion of right hip, initial encounter: Secondary | ICD-10-CM | POA: Diagnosis not present

## 2019-08-06 DIAGNOSIS — M25561 Pain in right knee: Secondary | ICD-10-CM | POA: Diagnosis not present

## 2019-08-06 DIAGNOSIS — M25551 Pain in right hip: Secondary | ICD-10-CM | POA: Diagnosis not present

## 2019-08-06 DIAGNOSIS — Z881 Allergy status to other antibiotic agents status: Secondary | ICD-10-CM | POA: Diagnosis not present

## 2019-08-06 DIAGNOSIS — S0990XA Unspecified injury of head, initial encounter: Secondary | ICD-10-CM | POA: Diagnosis not present

## 2019-08-06 DIAGNOSIS — W01198A Fall on same level from slipping, tripping and stumbling with subsequent striking against other object, initial encounter: Secondary | ICD-10-CM | POA: Diagnosis not present

## 2019-08-06 DIAGNOSIS — Z880 Allergy status to penicillin: Secondary | ICD-10-CM | POA: Diagnosis not present

## 2019-08-06 DIAGNOSIS — W19XXXA Unspecified fall, initial encounter: Secondary | ICD-10-CM | POA: Diagnosis not present

## 2019-08-06 NOTE — Telephone Encounter (Signed)
pt called to cancel tomorrow's appt due to she had a fall at home.

## 2019-08-07 ENCOUNTER — Ambulatory Visit (HOSPITAL_COMMUNITY): Payer: PPO | Admitting: Physical Therapy

## 2019-08-12 ENCOUNTER — Telehealth (HOSPITAL_COMMUNITY): Payer: Self-pay | Admitting: Physical Therapy

## 2019-08-12 ENCOUNTER — Ambulatory Visit (HOSPITAL_COMMUNITY): Payer: PPO | Admitting: Physical Therapy

## 2019-08-12 NOTE — Telephone Encounter (Signed)
pt just called and said that the transportation did not pick her up for her appt

## 2019-08-14 ENCOUNTER — Ambulatory Visit (HOSPITAL_COMMUNITY): Payer: PPO | Admitting: Physical Therapy

## 2019-08-14 ENCOUNTER — Other Ambulatory Visit: Payer: Self-pay

## 2019-08-14 DIAGNOSIS — M6281 Muscle weakness (generalized): Secondary | ICD-10-CM

## 2019-08-14 DIAGNOSIS — R2689 Other abnormalities of gait and mobility: Secondary | ICD-10-CM
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
## 2019-08-18 DIAGNOSIS — E063 Autoimmune thyroiditis: Secondary | ICD-10-CM | POA: Diagnosis not present

## 2019-08-18 DIAGNOSIS — E1140 Type 2 diabetes mellitus with diabetic neuropathy, unspecified: Secondary | ICD-10-CM | POA: Diagnosis not present

## 2019-08-18 DIAGNOSIS — M170 Bilateral primary osteoarthritis of knee: Secondary | ICD-10-CM | POA: Diagnosis not present

## 2019-08-18 DIAGNOSIS — I872 Venous insufficiency (chronic) (peripheral): Secondary | ICD-10-CM | POA: Diagnosis not present

## 2019-08-19 ENCOUNTER — Ambulatory Visit (HOSPITAL_COMMUNITY): Payer: PPO | Attending: Family Medicine | Admitting: Physical Therapy

## 2019-08-19 ENCOUNTER — Other Ambulatory Visit: Payer: Self-pay

## 2019-08-19 ENCOUNTER — Encounter (HOSPITAL_COMMUNITY): Payer: Self-pay | Admitting: Physical Therapy

## 2019-08-19 DIAGNOSIS — R2689 Other abnormalities of gait and mobility: Secondary | ICD-10-CM

## 2019-08-19 DIAGNOSIS — M6281 Muscle weakness (generalized): Secondary | ICD-10-CM | POA: Insufficient documentation

## 2019-08-19 NOTE — Therapy (Signed)
[place], [place], [address] Phone: [phone]   Fax:  [phone]  Physical Therapy Treatment  Patient Details  Name: [name] [name] MRN: [id number] Date of Birth: 07/12/1955 Referring Provider (PT): Roxan Hockey MD   Encounter Date: 08/19/2019  PT End of Session - 08/19/19 0954    Visit Number  6    Number of Visits  12    Date for PT Re-Evaluation  08/23/19   Progress note complete 07/31/19   Authorization Type  [employer] (no auth, no visit limit)    Authorization Time Period  07/10/19-08/23/19    Authorization - Visit Number  6    Authorization - Number of Visits  10    PT Start Time  [id number]    PT Stop Time  1035    PT Time Calculation (min)  48 min    Equipment Utilized During Treatment  Gait belt   RW   Activity Tolerance  Patient tolerated treatment well;Patient limited by fatigue    Behavior During Therapy  [facility] for tasks assessed/performed       Past Medical History:  Diagnosis Date  . Anemia   . Anxiety   . Arthritis   . Asthma   . Atrial fibrillation ([place])   . Cirrhosis of liver ([place])   . COPD (chronic obstructive pulmonary disease) ([place])   . Coronary artery disease   . Depression   . Edema   . Essential hypertension, benign   . GERD (gastroesophageal reflux disease)   . Hypothyroidism   . Morbid obesity ([place])   . Overactive bladder   . Sleep apnea    CPAP - not consistent  . Thyroid cancer ([place])   . Type 2 diabetes mellitus ([place])     Past Surgical History:  Procedure Laterality Date  . "pump bumps"     bilateral heels--2000  . ABDOMINAL HYSTERECTOMY  1985  . CARDIAC CATHETERIZATION  2012   Dr. Lia Foyer told her nothing was wrong.  [name] [place]  . CHOLECYSTECTOMY  1996  . COLONOSCOPY WITH PROPOFOL N/A 04/17/2015   Procedure: COLONOSCOPY WITH PROPOFOL  at cecum at 0810; withdrawal time =15 minutes;  Surgeon: [name], MD;  [facility];  Service:  Endoscopy;  Laterality: N/A;  . Debridement of abdominal wound  2011  . ESOPHAGEAL DILATION N/A 04/17/2015   Procedure: ESOPHAGEAL DILATION WITH 56FR MALONEY DILATOR;  Surgeon: [name], MD;  [facility]: [facility];  Service: Endoscopy;  Laterality: N/A;  . ESOPHAGOGASTRODUODENOSCOPY (EGD) WITH PROPOFOL N/A 04/17/2015   Procedure: ESOPHAGOGASTRODUODENOSCOPY (EGD) WITH PROPOFOL;  Surgeon: [name], MD;  [facility]: [facility];  Service: Endoscopy;  Laterality: N/A;  . EXPLORATORY LAPAROTOMY  2003  . FOOT SURGERY Bilateral 2000   Bone spur removed  . FOOT SURGERY    . INCISIONAL HERNIA REPAIR N/A 06/09/2014   Procedure: RECURRENT  INCISIONAL HERNIORRHAPHY WITH MESH;  Surgeon: [name], MD;  Location: [facility];  Service: General;  Laterality: N/A;  . INCISIONAL HERNIA REPAIR N/A 10/15/2014   Procedure: [place];  Surgeon: [name], MD;  Location: [place];  Service: General;  Laterality: N/A;  . INSERTION OF MESH N/A 06/09/2014   Procedure: INSERTION OF MESH;  Surgeon: [name], MD;  [facility];  Service: General;  Laterality: N/A;  . INSERTION OF MESH N/A 10/15/2014   Procedure: INSERTION OF MESH;  Surgeon: [name], MD;  Location:  [place] [facility];  Service: General;  Laterality: N/A;  . POLYPECTOMY  04/17/2015   Procedure: POLYPECTOMY;  Surgeon: [name], MD;  [facility];  Service: Endoscopy;;  . RESECTION DISTAL CLAVICAL  05/07/2012   Procedure: RESECTION DISTAL CLAVICAL;  Surgeon: [name], MD;  [facility];  Service: Orthopedics;  Laterality: Right;  . SHOULDER OPEN ROTATOR CUFF REPAIR  05/07/2012   Procedure: ROTATOR CUFF REPAIR SHOULDER OPEN;  Surgeon: [name], MD;  [facility];  Service: Orthopedics;  Laterality: Right;  . TEE WITHOUT CARDIOVERSION N/A 12/26/2018   Procedure: TRANSESOPHAGEAL ECHOCARDIOGRAM (TEE) WITH PROPOFOL;  Surgeon: Arnoldo Lenis, MD;  [facility];  Service:  Endoscopy;  Laterality: N/A;  . THYROIDECTOMY  2009  . TONSILLECTOMY  1958  . UMBILICAL HERNIA REPAIR  2010    There were no vitals filed for this visit.  Subjective Assessment - 08/19/19 0956    Subjective  Patient says RT knee is still bothering her from her fall 2 weeks ago. Says she has not had any falls since then, but "come close".    Currently in Pain?  Yes    Pain Score  7     Pain Location  Knee    Pain Orientation  Right    Pain Descriptors / Indicators  Aching    Pain Type  Chronic pain    Pain Onset  More than a month ago    Pain Frequency  Constant                       [facility] Adult PT Treatment/Exercise - 08/19/19 0001      Lumbar Exercises: Aerobic   Nustep  5 min warmup       Lumbar Exercises: Standing   Heel Raises  --      Knee/Hip Exercises: Stretches   Knee: Self-Stretch to increase Flexion  Both;5 reps;10 seconds    Knee: Self-Stretch Limitations  on 6 inch box     Gastroc Stretch  Both;3 reps;30 seconds    Gastroc Stretch Limitations  slant board       Knee/Hip Exercises: Standing   Heel Raises  2 sets;10 reps    Forward Step Up  Both;10 reps;Step Height: 4";Hand Hold: 2    Gait Training  226 feet with RW, cues for upright posturing     Other Standing Knee Exercises  sidestepping at // bars 5 RT    Other Standing Knee Exercises  tandem stance, solid floor, 3 x 20 sec with intermittent HHA; step taps 4 inch box, alternating with HHA x 1      Knee/Hip Exercises: Seated   Sit to Sand  10 reps;without UE support               PT Short Term Goals - 07/31/19 1100      PT SHORT TERM GOAL #1   Title  Patient will be independent with initial HEP to improve functional outcomes    Time  3    Period  Weeks    Status  Achieved    Target Date  08/02/19        PT Long Term Goals - 07/31/19 1101      PT LONG TERM GOAL #1   Title  Patient will be able to perform stand x 5 in < 15 seconds to demonstrate improvement in functional  mobility and reduced risk for falls.    Baseline  Current: 14.1  sec with no UE    Time  6    Period  Weeks    Status  Achieved      PT LONG TERM GOAL #2   Title  Patient will be able to maintain tandem stance >30 seconds on BLEs to improve stability and reduce risk for falls    Time  6    Period  Weeks    Status  On-going      PT LONG TERM GOAL #3   Title  Patient will be able to ambulate at least 275 feet during 2MWT with LRAD to demonstrate improved ability to perform functional mobility and associated tasks.    Baseline  Current: 230 feet with RW    Time  6    Period  Weeks    Status  On-going      PT LONG TERM GOAL #4   Title  Patient will report at least 60% overall improvement in subjective complaint to indicate improvement in ability to perform ADLs.    Baseline  Current: 50%    Time  6    Period  Weeks    Status  On-going            Plan - 08/19/19 1117    Clinical Impression Statement  Patient tolerated session well today despite reports of elevated RT knee pain. Patient is making steady progress toward therapy goals. Patient was able to perform full lap gait training around gym using RW, and was able to maintain 30 sec tandem hold with LT foot posterior. Patient continues to be limited by fatigue with activity, and decreased balance with weight on RLE. Patient required verbal cues for sequencing during step ups and 4 inch box, and cues for maintaining upright posturing with gait training and standing tandem balance. Patient reported no increased knee pain post session but did note increased fatigue, also required several breaks for rest during treatment due to increased fatigue with standing activity.    Personal Factors and Comorbidities  Age;Comorbidity 3+;Fitness    Comorbidities  DM, COPD, HTN    Examination-Activity Limitations  Continence;Stairs;Locomotion Level;Squat;Lift    Examination-Participation Restrictions  Cleaning;Community Activity     Stability/Clinical Decision Making  Stable/Uncomplicated    Rehab Potential  Good    PT Frequency  2x / week    PT Duration  6 weeks    PT Treatment/Interventions  ADLs/Self Care Home Management;Aquatic Therapy;Cryotherapy;Electrical Stimulation;Moist Heat;Gait training;Stair training;Functional mobility training;DME Instruction;Therapeutic activities;Therapeutic exercise;Balance training;Neuromuscular re-education;Patient/family education;Manual techniques;Joint Manipulations;Passive range of motion;Energy conservation;Splinting;Taping    PT Next Visit Plan  Reassessment next visit, update POC as indiacted    PT Home Exercise Plan  07/10/19: seated marching, LAQ, heel/ toe raises seated; 1/27:  diapharagmic breathing, scapular retraction, abdominal isometric( round the clock), self manual to decrease abdominal lymphatic volume, sit to stand.    Consulted and Agree with Plan of Care  Patient       Patient will benefit from skilled therapeutic intervention in order to improve the following deficits and impairments:  Abnormal gait, Decreased balance, Decreased mobility, Decreased skin integrity, Difficulty walking, Obesity, Decreased strength, Decreased activity tolerance, Decreased safety awareness, Decreased knowledge of use of DME, Increased edema, Improper body mechanics  Visit Diagnosis: Muscle weakness (generalized)  Other abnormalities of gait and mobility     Problem List Patient Active Problem List   Diagnosis Date Noted  . Acute lower UTI 06/22/2019  . Hypokalemia 06/20/2019  . AKI (acute kidney injury) ([place]) 06/01/2019  . NASH (  nonalcoholic steatohepatitis) 06/01/2019  . Pressure injury of skin 05/31/2019  . Candidiasis, intertrigo 05/31/2019  . Yeast infection 05/31/2019  . Fever 12/24/2018  . Hyponatremia 12/24/2018  . Abnormal liver function 12/24/2018  . Sepsis due to undetermined organism ([place]) 11/07/2018  . Atrial fibrillation ([place]) 11/07/2018  . COPD (chronic  obstructive pulmonary disease) ([place]) 11/07/2018  . GERD (gastroesophageal reflux disease) 11/07/2018  . Hypothyroidism 11/07/2018  . Sleep apnea 11/07/2018  . Hepatic cirrhosis ([place]) 09/01/2014  . Cirrhosis, nonalcoholic ([place]) 93/57/0177  . Incisional hernia 06/09/2014  . S/P complete repair of rotator cuff 07/24/2012  . Precordial pain 09/30/2010  . Essential hypertension, benign 09/30/2010  . Type 2 diabetes mellitus ([place]) 09/05/2008  . OBESITY-MORBID (>100') 09/05/2008  . ESOPHAGEAL REFLUX 09/05/2008  . EDEMA 09/05/2008    11:25 AM, 08/19/19 Josue Hector PT DPT  Physical Therapist with [facility]  [phone] [place], [place], [address] Phone: [phone]   Fax:  [phone]  Name: [name] [name] MRN: [id number] Date of Birth: 09-12-1955

## 2019-08-21 ENCOUNTER — Encounter (HOSPITAL_COMMUNITY): Payer: Self-pay | Admitting: Physical Therapy

## 2019-08-21 ENCOUNTER — Ambulatory Visit (HOSPITAL_COMMUNITY): Payer: PPO | Admitting: Physical Therapy

## 2019-08-21 ENCOUNTER — Other Ambulatory Visit: Payer: Self-pay

## 2019-08-21 DIAGNOSIS — R2689 Other abnormalities of gait and mobility: Secondary | ICD-10-CM

## 2019-08-21 DIAGNOSIS — M6281 Muscle weakness (generalized): Secondary | ICD-10-CM

## 2019-08-21 NOTE — Therapy (Signed)
[place], [place], [address] Phone: [phone]   Fax:  [phone]  Physical Therapy Treatment  Patient Details  Name: [name] [name] MRN: [id number] Date of Birth: Aug 20, 1955 Referring Provider (PT): Roxan Hockey MD   Encounter Date: 08/21/2019   Progress Note Reporting Period 07/31/19 to 08/21/19  See note below for Objective Data and Assessment of Progress/Goals.       PT End of Session - 08/21/19 1006    Visit Number  7    Number of Visits  15    Date for PT Re-Evaluation  09/20/19   Progress note complete 08/21/19   Authorization Type  [employer] (no auth, no visit limit)    Authorization Time Period  07/10/19-08/23/19; 08/21/19-09/20/19    Authorization - Visit Number  7    Authorization - Number of Visits  10    PT Start Time  0950    PT Stop Time  1028    PT Time Calculation (min)  38 min    Equipment Utilized During Treatment  Gait belt   RW   Activity Tolerance  Patient tolerated treatment well;Patient limited by fatigue    Behavior During Therapy  WFL for tasks assessed/performed       Past Medical History:  Diagnosis Date  . Anemia   . Anxiety   . Arthritis   . Asthma   . Atrial fibrillation ([place])   . Cirrhosis of liver ([place])   . COPD (chronic obstructive pulmonary disease) ([place])   . Coronary artery disease   . Depression   . Edema   . Essential hypertension, benign   . GERD (gastroesophageal reflux disease)   . Hypothyroidism   . Morbid obesity ([place])   . Overactive bladder   . Sleep apnea    CPAP - not consistent  . Thyroid cancer ([place])   . Type 2 diabetes mellitus ([place])     Past Surgical History:  Procedure Laterality Date  . "pump bumps"     bilateral heels--2000  . ABDOMINAL HYSTERECTOMY  1985  . CARDIAC CATHETERIZATION  2012   Dr. Lia Foyer told her nothing was wrong.  [name] [place]  . CHOLECYSTECTOMY  1996  . COLONOSCOPY WITH PROPOFOL N/A 04/17/2015    Procedure: COLONOSCOPY WITH PROPOFOL  at cecum at 0810; withdrawal time =15 minutes;  Surgeon: [name], MD;  [facility];  Service: Endoscopy;  Laterality: N/A;  . Debridement of abdominal wound  2011  . ESOPHAGEAL DILATION N/A 04/17/2015   Procedure: ESOPHAGEAL DILATION WITH 56FR MALONEY DILATOR;  Surgeon: [name], MD;  [facility];  Service: Endoscopy;  Laterality: N/A;  . ESOPHAGOGASTRODUODENOSCOPY (EGD) WITH PROPOFOL N/A 04/17/2015   Procedure: ESOPHAGOGASTRODUODENOSCOPY (EGD) WITH PROPOFOL;  Surgeon: [name], MD;  [facility];  Service: Endoscopy;  Laterality: N/A;  . EXPLORATORY LAPAROTOMY  2003  . FOOT SURGERY Bilateral 2000   Bone spur removed  . FOOT SURGERY    . INCISIONAL HERNIA REPAIR N/A 06/09/2014   Procedure: RECURRENT  INCISIONAL HERNIORRHAPHY WITH MESH;  Surgeon: [name], MD;  Location: [facility];  Service: General;  Laterality: N/A;  . INCISIONAL HERNIA REPAIR N/A 10/15/2014   Procedure: [place];  Surgeon: [name], MD;  Location: [place];  Service: General;  Laterality: N/A;  . INSERTION OF MESH N/A 06/09/2014   Procedure: INSERTION OF MESH;  Surgeon: [name], MD;  [facility];  Service: General;  Laterality: N/A;  . INSERTION OF MESH N/A 10/15/2014   Procedure: INSERTION OF MESH;  Surgeon: [name], MD;  Location: [place];  Service: General;  Laterality: N/A;  . POLYPECTOMY  04/17/2015   Procedure: POLYPECTOMY;  Surgeon: [name], MD;  [facility];  Service: Endoscopy;;  . RESECTION DISTAL CLAVICAL  05/07/2012   Procedure: RESECTION DISTAL CLAVICAL;  Surgeon: [name], MD;  Location[facility];  Service: Orthopedics;  Laterality: Right;  . SHOULDER OPEN ROTATOR CUFF REPAIR  05/07/2012   Procedure: ROTATOR CUFF REPAIR SHOULDER OPEN;  Surgeon: [name], MD;  [facility];  Service: Orthopedics;  Laterality: Right;  . TEE WITHOUT CARDIOVERSION N/A  12/26/2018   Procedure: TRANSESOPHAGEAL ECHOCARDIOGRAM (TEE) WITH PROPOFOL;  Surgeon: Arnoldo Lenis, MD;  [facility];  Service: Endoscopy;  Laterality: N/A;  . THYROIDECTOMY  2009  . TONSILLECTOMY  1958  . UMBILICAL HERNIA REPAIR  2010    There were no vitals filed for this visit.  Subjective Assessment - 08/21/19 0955    Subjective  Patient RT knee is sore today. Says she has been doing some work around her house which has caused more soreness. Patient says she was very stiff on Monday after session. Patient says she feels overall 65-75% improvement since starting therapy, but notes ongoing issues with balance and says she almost fell 2 times this week. Patient says he pain also seems related to weather.    Limitations  Lifting;Standing;Walking;House hold activities    How long can you stand comfortably?  8 minutes    How long can you walk comfortably?  8 minutes with RW    Patient Stated Goals  get back where I can feel better and do some stuff    Currently in Pain?  Yes    Pain Score  6     Pain Location  Knee    Pain Orientation  Right    Pain Descriptors / Indicators  Aching    Pain Type  Chronic pain    Pain Onset  More than a month ago    Pain Frequency  Constant    Aggravating Factors   WB    Pain Relieving Factors  rest, meds    Effect of Pain on Daily Activities  Limits         [facility] PT Assessment - 08/21/19 0001      Assessment   Medical Diagnosis  Generalized Weakness    Referring Provider (PT)  Roxan Hockey MD    Onset Date/Surgical Date  06/20/19    Prior Therapy  Yes      Precautions   Precautions  Fall      Restrictions   Weight Bearing Restrictions  No      Balance Screen   Has the patient fallen in the past 6 months  Yes    Has the patient had a decrease in activity level because of a fear of falling?   Yes    Is the patient reluctant to leave their home because of a fear of falling?   Yes      [place] residence    Living Arrangements  Alone    Type of [place]  One level    Additional Comments  Has several people to assist with cleaning home, and community ADLs, cousing,  granddaiughter, friend       Prior Function   Level of Independence  Independent with household mobility with device    Vocation  Retired      Associate Professor   Overall Cognitive Status  Within Functional Limits for tasks assessed      Strength   Overall Strength Comments  tested in seated     Right Hip Flexion  4/5   was 4-   Right Hip Extension  4-/5    Right Hip ABduction  4+/5   was 4   Left Hip Flexion  4/5    Left Hip Extension  4+/5    Left Hip ABduction  4+/5   was 4   Right Knee Flexion  4+/5   was 4   Right Knee Extension  4+/5   was 4   Left Knee Flexion  4+/5    Left Knee Extension  5/5    Right Ankle Dorsiflexion  4+/5   was 4   Left Ankle Dorsiflexion  4+/5      Ambulation/Gait   Ambulation/Gait  Yes      Static Standing Balance   Static Standing Balance -  Activities   Tandam Stance - Right Leg;Tandam Stance - Left Leg                   [facility] Adult PT Treatment/Exercise - 08/21/19 0001      Ambulation/Gait   Ambulation/Gait Assistance  6: Modified independent (Device/Increase time)    Ambulation Distance (Feet)  235 Feet    Assistive device  Rolling walker    Gait Pattern  Decreased stride length;Trunk flexed;Decreased step length - right;Decreased step length - left    Ambulation Surface  Level;Indoor    Gait Comments  2MWT      Lumbar Exercises: Aerobic   Nustep  5 min EOS for mobility and endurance       Knee/Hip Exercises: Stretches   Knee: Self-Stretch to increase Flexion  Both;5 reps;10 seconds    Knee: Self-Stretch Limitations  on 6 inch box     Gastroc Stretch  Both;3 reps;30 seconds    Gastroc Stretch Limitations  at slope       Knee/Hip Exercises: Standing   Other Standing Knee  Exercises  tandem stance, solid floor, 3 x 20 sec, intermittent HHA             PT Education - 08/21/19 1032    Education Details  On reassessment findings, progress to goals and POC    Person(s) Educated  Patient    Methods  Explanation    Comprehension  Verbalized understanding       PT Short Term Goals - 07/31/19 1100      PT SHORT TERM GOAL #1   Title  Patient will be independent with initial HEP to improve functional outcomes    Time  3    Period  Weeks    Status  Achieved    Target Date  08/02/19        PT Long Term Goals - 08/21/19 1027      PT LONG TERM GOAL #1   Title  Patient will be able to perform stand x 5 in < 15 seconds to demonstrate improvement in functional mobility and reduced risk for falls.    Baseline  Current: 14.1 sec with no UE    Time  6    Period  Weeks    Status  Achieved  PT LONG TERM GOAL #2   Title  Patient will be able to maintain tandem stance >30 seconds on BLEs to improve stability and reduce risk for falls    Baseline  Current: 30 sec RT; 20 sec LT    Time  6    Period  Weeks    Status  On-going      PT LONG TERM GOAL #3   Title  Patient will be able to ambulate at least 275 feet during 2MWT with LRAD to demonstrate improved ability to perform functional mobility and associated tasks.    Baseline  Current: 235 feet with RW    Time  6    Period  Weeks    Status  On-going      PT LONG TERM GOAL #4   Title  Patient will report at least 60% overall improvement in subjective complaint to indicate improvement in ability to perform ADLs.    Baseline  Current: 65-75%    Time  6    Period  Weeks    Status  Achieved            Plan - 08/21/19 1024    Clinical Impression Statement  Patient is making steady progress to LTGs. Patient shows improvement in BLE strength and balance, as well increased gait distance. Patient continues to be limited by RT knee pain, balance, fatigue and gait deficits which continue to negatively  impact function and contribute to pain. Patient will continue to benefit from skilled therapy services to address remaining deficits to reduce pain and improve LOF with ADLs and functional mobility and reduce risk for future falls.    Personal Factors and Comorbidities  Age;Comorbidity 3+;Fitness    Comorbidities  DM, COPD, HTN    Examination-Activity Limitations  Continence;Stairs;Locomotion Level;Squat;Lift    Examination-Participation Restrictions  Cleaning;Community Activity    Stability/Clinical Decision Making  Stable/Uncomplicated    Rehab Potential  Good    PT Frequency  2x / week    PT Duration  4 weeks    PT Treatment/Interventions  ADLs/Self Care Home Management;Aquatic Therapy;Cryotherapy;Electrical Stimulation;Moist Heat;Gait training;Stair training;Functional mobility training;DME Instruction;Therapeutic activities;Therapeutic exercise;Balance training;Neuromuscular re-education;Patient/family education;Manual techniques;Joint Manipulations;Passive range of motion;Energy conservation;Splinting;Taping    PT Next Visit Plan  Continue to progress as tolerated with focus on giat, balance, posture, edurance and functional strength.    PT Home Exercise Plan  07/10/19: seated marching, LAQ, heel/ toe raises seated; 1/27:  diapharagmic breathing, scapular retraction, abdominal isometric( round the clock), self manual to decrease abdominal lymphatic volume, sit to stand.    Consulted and Agree with Plan of Care  Patient       Patient will benefit from skilled therapeutic intervention in order to improve the following deficits and impairments:  Abnormal gait, Decreased balance, Decreased mobility, Decreased skin integrity, Difficulty walking, Obesity, Decreased strength, Decreased activity tolerance, Decreased safety awareness, Decreased knowledge of use of DME, Increased edema, Improper body mechanics  Visit Diagnosis: Muscle weakness (generalized)  Other abnormalities of gait and  mobility     Problem List Patient Active Problem List   Diagnosis Date Noted  . Acute lower UTI 06/22/2019  . Hypokalemia 06/20/2019  . AKI (acute kidney injury) ([place]) 06/01/2019  . NASH (nonalcoholic steatohepatitis) 06/01/2019  . Pressure injury of skin 05/31/2019  . Candidiasis, intertrigo 05/31/2019  . Yeast infection 05/31/2019  . Fever 12/24/2018  . Hyponatremia 12/24/2018  . Abnormal liver function 12/24/2018  . Sepsis due to undetermined organism ([place]) 11/07/2018  . Atrial fibrillation ([place])  11/07/2018  . COPD (chronic obstructive pulmonary disease) ([place]) 11/07/2018  . GERD (gastroesophageal reflux disease) 11/07/2018  . Hypothyroidism 11/07/2018  . Sleep apnea 11/07/2018  . Hepatic cirrhosis ([place]) 09/01/2014  . Cirrhosis, nonalcoholic ([place]) 21/97/5883  . Incisional hernia 06/09/2014  . S/P complete repair of rotator cuff 07/24/2012  . Precordial pain 09/30/2010  . Essential hypertension, benign 09/30/2010  . Type 2 diabetes mellitus ([place]) 09/05/2008  . OBESITY-MORBID (>100') 09/05/2008  . ESOPHAGEAL REFLUX 09/05/2008  . EDEMA 09/05/2008   10:35 AM, 08/21/19 Josue Hector PT DPT  Physical Therapist with [facility]  [phone] [place] [address] [place], [place], [address] Phone: [phone]   Fax:  [phone]  Name: [name] [name] MRN: [id number] Date of Birth: 1956/06/10

## 2019-08-26 ENCOUNTER — Ambulatory Visit (HOSPITAL_COMMUNITY): Payer: PPO | Admitting: Physical Therapy

## 2019-08-26 ENCOUNTER — Telehealth (HOSPITAL_COMMUNITY): Payer: Self-pay | Admitting: Physical Therapy

## 2019-08-26 NOTE — Telephone Encounter (Signed)
pt cancelled appt because [facility] did not pick her up

## 2019-08-28 ENCOUNTER — Other Ambulatory Visit: Payer: Self-pay

## 2019-08-28 ENCOUNTER — Ambulatory Visit (HOSPITAL_COMMUNITY): Payer: PPO | Admitting: Physical Therapy

## 2019-08-28 ENCOUNTER — Encounter (HOSPITAL_COMMUNITY): Payer: Self-pay | Admitting: Physical Therapy

## 2019-08-28 ENCOUNTER — Telehealth: Payer: Self-pay | Admitting: Cardiology

## 2019-08-28 DIAGNOSIS — R2689 Other abnormalities of gait and mobility: Secondary | ICD-10-CM

## 2019-08-28 DIAGNOSIS — M6281 Muscle weakness (generalized): Secondary | ICD-10-CM

## 2019-08-28 NOTE — Telephone Encounter (Signed)

## 2019-08-28 NOTE — Therapy (Signed)
[place], [place], [address] Phone: [phone]   Fax:  [phone]  Physical Therapy Treatment  Patient Details  Name: [name] [name] MRN: [id number] Date of Birth: August 13, 1955 Referring Provider (PT): Roxan Hockey MD   Encounter Date: 08/28/2019  PT End of Session - 08/28/19 1122    Visit Number  8    Number of Visits  15    Date for PT Re-Evaluation  09/20/19   Progress note complete 08/21/19   Authorization Type  [employer] (no auth, no visit limit)    Authorization Time Period  07/10/19-08/23/19; 08/21/19-09/20/19    Authorization - Visit Number  1    Authorization - Number of Visits  10    PT Start Time  1116    PT Stop Time  1200    PT Time Calculation (min)  44 min    Equipment Utilized During Treatment  Gait belt   RW   Activity Tolerance  Patient tolerated treatment well;Patient limited by fatigue    Behavior During Therapy  [facility] for tasks assessed/performed       Past Medical History:  Diagnosis Date  . Anemia   . Anxiety   . Arthritis   . Asthma   . Atrial fibrillation ([place])   . Cirrhosis of liver ([place])   . COPD (chronic obstructive pulmonary disease) ([place])   . Coronary artery disease   . Depression   . Edema   . Essential hypertension, benign   . GERD (gastroesophageal reflux disease)   . Hypothyroidism   . Morbid obesity ([place])   . Overactive bladder   . Sleep apnea    CPAP - not consistent  . Thyroid cancer ([place])   . Type 2 diabetes mellitus ([place])     Past Surgical History:  Procedure Laterality Date  . "pump bumps"     bilateral heels--2000  . ABDOMINAL HYSTERECTOMY  1985  . CARDIAC CATHETERIZATION  2012   Dr. Lia Foyer told her nothing was wrong.  [name] [place]  . CHOLECYSTECTOMY  1996  . COLONOSCOPY WITH PROPOFOL N/A 04/17/2015   Procedure: COLONOSCOPY WITH PROPOFOL  at cecum at 0810; withdrawal time =15 minutes;  Surgeon: [name], MD;  [facility]: [facility];  Service: Endoscopy;  Laterality: N/A;  . Debridement of abdominal wound  2011  . ESOPHAGEAL DILATION N/A 04/17/2015   Procedure: ESOPHAGEAL DILATION WITH 56FR MALONEY DILATOR;  Surgeon: [name], MD;  Location: [facility] ORS;  Service: Endoscopy;  Laterality: N/A;  . ESOPHAGOGASTRODUODENOSCOPY (EGD) WITH PROPOFOL N/A 04/17/2015   Procedure: ESOPHAGOGASTRODUODENOSCOPY (EGD) WITH PROPOFOL;  Surgeon: [name], MD;  Location: [facility] ORS;  Service: Endoscopy;  Laterality: N/A;  . EXPLORATORY LAPAROTOMY  2003  . FOOT SURGERY Bilateral 2000   Bone spur removed  . FOOT SURGERY    . INCISIONAL HERNIA REPAIR N/A 06/09/2014   Procedure: RECURRENT  INCISIONAL HERNIORRHAPHY WITH MESH;  Surgeon: [name], MD;  Location: [facility] ORS;  Service: General;  Laterality: N/A;  . INCISIONAL HERNIA REPAIR N/A 10/15/2014   Procedure: [place];  Surgeon: [name], MD;  Location: [place];  Service: General;  Laterality: N/A;  . INSERTION OF MESH N/A 06/09/2014   Procedure: INSERTION OF MESH;  Surgeon: [name], MD;  Location: [facility];  Service: General;  Laterality: N/A;  . INSERTION OF MESH N/A 10/15/2014   Procedure: INSERTION OF MESH;  Surgeon: [name], MD;  Location: [place] OR;  Service: General;  Laterality: N/A;  . POLYPECTOMY  04/17/2015   Procedure: POLYPECTOMY;  Surgeon: [name], MD;  Location: [facility]S;  Service: Endoscopy;;  . RESECTION DISTAL CLAVICAL  05/07/2012   Procedure: RESECTION DISTAL CLAVICAL;  Surgeon: [name], MD;  Location: [facility] ORS;  Service: Orthopedics;  Laterality: Right;  . SHOULDER OPEN ROTATOR CUFF REPAIR  05/07/2012   Procedure: ROTATOR CUFF REPAIR SHOULDER OPEN;  Surgeon: [name], MD;  Location: [facility];  Service: Orthopedics;  Laterality: Right;  . TEE WITHOUT CARDIOVERSION N/A 12/26/2018   Procedure: TRANSESOPHAGEAL ECHOCARDIOGRAM (TEE) WITH PROPOFOL;  Surgeon: Arnoldo Lenis, MD;  Location[facility];  Service: Endoscopy;  Laterality: N/A;  . THYROIDECTOMY  2009  . TONSILLECTOMY  1958  . UMBILICAL HERNIA REPAIR  2010    There were no vitals filed for this visit.  Subjective Assessment - 08/28/19 1122    Subjective  Patient says her knees are doing better today, RT knee not bothering her too much today. Says she had some pain over the weekend. Says balance continues to be an issue, especially when standing from chair. Has not had any fall recently but has come close.    Limitations  Lifting;Standing;Walking;House hold activities    How long can you stand comfortably?  8 minutes    How long can you walk comfortably?  8 minutes with RW    Patient Stated Goals  get back where I can feel better and do some stuff    Currently in Pain?  Yes    Pain Score  4     Pain Location  Knee    Pain Orientation  Right    Pain Descriptors / Indicators  Aching    Pain Type  Chronic pain    Pain Onset  More than a month ago                       [facility] Adult PT Treatment/Exercise - 08/28/19 0001      Lumbar Exercises: Aerobic   Nustep  5 min lv 2 EOS for mobility and endurance       Knee/Hip Exercises: Stretches   Knee: Self-Stretch to increase Flexion  Both;5 reps;10 seconds    Knee: Self-Stretch Limitations  on 6 inch box     Gastroc Stretch  Both;3 reps;30 seconds    Gastroc Stretch Limitations  slant board       Knee/Hip Exercises: Standing   Heel Raises  Both;15 reps    Forward Step Up  Both;10 reps;Hand Hold: 2;Step Height: 6"    Gait Training  200 feet with RW, cues for upright posturing     Other Standing Knee Exercises  NBOS on foam with head turns x10     Other Standing Knee Exercises  tandem stance, solid floor, 3 x 30 sec, intermittent HHA; 6 inch alternating box taps x 20 with HHA x 1      Knee/Hip Exercises: Seated   Sit to Sand  10 reps;without UE support               PT Short Term Goals - 07/31/19 1100      PT SHORT TERM GOAL #1   Title   Patient will be independent with initial HEP to improve functional outcomes    Time  3    Period  Weeks    Status  Achieved    Target Date  08/02/19  PT Long Term Goals - 08/21/19 1027      PT LONG TERM GOAL #1   Title  Patient will be able to perform stand x 5 in < 15 seconds to demonstrate improvement in functional mobility and reduced risk for falls.    Baseline  Current: 14.1 sec with no UE    Time  6    Period  Weeks    Status  Achieved      PT LONG TERM GOAL #2   Title  Patient will be able to maintain tandem stance >30 seconds on BLEs to improve stability and reduce risk for falls    Baseline  Current: 30 sec RT; 20 sec LT    Time  6    Period  Weeks    Status  On-going      PT LONG TERM GOAL #3   Title  Patient will be able to ambulate at least 275 feet during 2MWT with LRAD to demonstrate improved ability to perform functional mobility and associated tasks.    Baseline  Current: 235 feet with RW    Time  6    Period  Weeks    Status  On-going      PT LONG TERM GOAL #4   Title  Patient will report at least 60% overall improvement in subjective complaint to indicate improvement in ability to perform ADLs.    Baseline  Current: 65-75%    Time  6    Period  Weeks    Status  Achieved            Plan - 08/28/19 1410    Clinical Impression Statement  Patient tolerated session well today, no reports of increased knee pain. Patient was able to progress step ups to 6 inch box height, but requires HHA and demos difficulty with controlled ascent on RT. Patient required cueing for upright posturing and using leg muscles over UEs to assist with ascending step. Patient shows improved static balance with tandem stance and was progressed to step taps and NBOS on foam with head turns. Patient did well but required HHA x 1 for step taps, was able to maintain NBOS on foam with no HHA. Patient continues to be limited by fatigue and requires several breaks for rest during  treatment. Patient was able to progress resistance level to 2 on nustep at EOS for improved conditioning.    Personal Factors and Comorbidities  Age;Comorbidity 3+;Fitness    Comorbidities  DM, COPD, HTN    Examination-Activity Limitations  Continence;Stairs;Locomotion Level;Squat;Lift    Examination-Participation Restrictions  Cleaning;Community Activity    Stability/Clinical Decision Making  Stable/Uncomplicated    Rehab Potential  Good    PT Frequency  2x / week    PT Duration  4 weeks    PT Treatment/Interventions  ADLs/Self Care Home Management;Aquatic Therapy;Cryotherapy;Electrical Stimulation;Moist Heat;Gait training;Stair training;Functional mobility training;DME Instruction;Therapeutic activities;Therapeutic exercise;Balance training;Neuromuscular re-education;Patient/family education;Manual techniques;Joint Manipulations;Passive range of motion;Energy conservation;Splinting;Taping    PT Next Visit Plan  Continue to progress as tolerated with focus on gait, balance, posture, endurance and functional strength.    PT Home Exercise Plan  07/10/19: seated marching, LAQ, heel/ toe raises seated; 1/27:  diapharagmic breathing, scapular retraction, abdominal isometric( round the clock), self manual to decrease abdominal lymphatic volume, sit to stand.    Consulted and Agree with Plan of Care  Patient       Patient will benefit from skilled therapeutic intervention in order to improve the following deficits and impairments:  Abnormal gait, Decreased balance, Decreased mobility, Decreased skin integrity, Difficulty walking, Obesity, Decreased strength, Decreased activity tolerance, Decreased safety awareness, Decreased knowledge of use of DME, Increased edema, Improper body mechanics  Visit Diagnosis: Muscle weakness (generalized)  Other abnormalities of gait and mobility     Problem List Patient Active Problem List   Diagnosis Date Noted  . Acute lower UTI 06/22/2019  . Hypokalemia  06/20/2019  . AKI (acute kidney injury) ([place]) 06/01/2019  . NASH (nonalcoholic steatohepatitis) 06/01/2019  . Pressure injury of skin 05/31/2019  . Candidiasis, intertrigo 05/31/2019  . Yeast infection 05/31/2019  . Fever 12/24/2018  . Hyponatremia 12/24/2018  . Abnormal liver function 12/24/2018  . Sepsis due to undetermined organism ([place]) 11/07/2018  . Atrial fibrillation ([place]) 11/07/2018  . COPD (chronic obstructive pulmonary disease) ([place]) 11/07/2018  . GERD (gastroesophageal reflux disease) 11/07/2018  . Hypothyroidism 11/07/2018  . Sleep apnea 11/07/2018  . Hepatic cirrhosis ([place]) 09/01/2014  . Cirrhosis, nonalcoholic ([place]) 34/75/8307  . Incisional hernia 06/09/2014  . S/P complete repair of rotator cuff 07/24/2012  . Precordial pain 09/30/2010  . Essential hypertension, benign 09/30/2010  . Type 2 diabetes mellitus ([place]) 09/05/2008  . OBESITY-MORBID (>100') 09/05/2008  . ESOPHAGEAL REFLUX 09/05/2008  . EDEMA 09/05/2008   2:16 PM, 08/28/19 Josue Hector PT DPT  Physical Therapist with [facility]  [phone] [place] [address] [place], [place], [address] Phone: [phone]   Fax:  [phone]  Name: [name] [name] MRN: [id number] Date of Birth: 10-23-1955

## 2019-09-02 ENCOUNTER — Ambulatory Visit (HOSPITAL_COMMUNITY): Payer: PPO | Admitting: Physical Therapy

## 2019-09-02 ENCOUNTER — Other Ambulatory Visit: Payer: Self-pay

## 2019-09-02 ENCOUNTER — Encounter (HOSPITAL_COMMUNITY): Payer: Self-pay | Admitting: Physical Therapy

## 2019-09-02 DIAGNOSIS — M6281 Muscle weakness (generalized): Secondary | ICD-10-CM

## 2019-09-02 DIAGNOSIS — R2689 Other abnormalities of gait and mobility: Secondary | ICD-10-CM

## 2019-09-02 NOTE — Therapy (Signed)
[place], [place], [address] Phone: [phone]   Fax:  [phone]  Physical Therapy Treatment  Patient Details  Name: [name] [name] MRN: [id number] Date of Birth: Oct 26, 1955 Referring Provider (PT): Roxan Hockey MD   Encounter Date: 09/02/2019  PT End of Session - 09/02/19 1045    Visit Number  9    Number of Visits  15    Date for PT Re-Evaluation  09/20/19   Progress note complete 08/21/19   Authorization Type  [employer] (no auth, no visit limit)    Authorization Time Period  07/10/19-08/23/19; 08/21/19-09/20/19    Authorization - Visit Number  2    Authorization - Number of Visits  10    PT Start Time  1034    PT Stop Time  1115    PT Time Calculation (min)  41 min    Equipment Utilized During Treatment  Gait belt   RW, LBQC   Activity Tolerance  Patient limited by fatigue;Patient limited by pain    Behavior During Therapy  [facility] for tasks assessed/performed       Past Medical History:  Diagnosis Date  . Anemia   . Anxiety   . Arthritis   . Asthma   . Atrial fibrillation ([place])   . Cirrhosis of liver ([place])   . COPD (chronic obstructive pulmonary disease) ([place])   . Coronary artery disease   . Depression   . Edema   . Essential hypertension, benign   . GERD (gastroesophageal reflux disease)   . Hypothyroidism   . Morbid obesity ([place])   . Overactive bladder   . Sleep apnea    CPAP - not consistent  . Thyroid cancer ([place])   . Type 2 diabetes mellitus ([place])     Past Surgical History:  Procedure Laterality Date  . "pump bumps"     bilateral heels--2000  . ABDOMINAL HYSTERECTOMY  1985  . CARDIAC CATHETERIZATION  2012   Dr. Lia Foyer told her nothing was wrong.  [name] [place]  . CHOLECYSTECTOMY  1996  . COLONOSCOPY WITH PROPOFOL N/A 04/17/2015   Procedure: COLONOSCOPY WITH PROPOFOL  at cecum at 0810; withdrawal time =15 minutes;  Surgeon: [name], MD;  [facility]: [facility];  Service: Endoscopy;  Laterality: N/A;  . Debridement of abdominal wound  2011  . ESOPHAGEAL DILATION N/A 04/17/2015   Procedure: ESOPHAGEAL DILATION WITH 56FR MALONEY DILATOR;  Surgeon: [name], MD;  Location: [facility];  Service: Endoscopy;  Laterality: N/A;  . ESOPHAGOGASTRODUODENOSCOPY (EGD) WITH PROPOFOL N/A 04/17/2015   Procedure: ESOPHAGOGASTRODUODENOSCOPY (EGD) WITH PROPOFOL;  Surgeon: [name], MD;  Location: [facility];  Service: Endoscopy;  Laterality: N/A;  . EXPLORATORY LAPAROTOMY  2003  . FOOT SURGERY Bilateral 2000   Bone spur removed  . FOOT SURGERY    . INCISIONAL HERNIA REPAIR N/A 06/09/2014   Procedure: RECURRENT  INCISIONAL HERNIORRHAPHY WITH MESH;  Surgeon: [name], MD;  Location: [facility];  Service: General;  Laterality: N/A;  . INCISIONAL HERNIA REPAIR N/A 10/15/2014   Procedure: [place];  Surgeon: [name], MD;  Location: [facility];  Service: General;  Laterality: N/A;  . INSERTION OF MESH N/A 06/09/2014   Procedure: INSERTION OF MESH;  Surgeon: [name], MD;  Location: [facility];  Service: General;  Laterality: N/A;  . INSERTION OF MESH N/A 10/15/2014   Procedure: INSERTION OF MESH;  Surgeon: [name], MD;  Location: [facility] OR;  Service: General;  Laterality: N/A;  . POLYPECTOMY  04/17/2015   Procedure: POLYPECTOMY;  Surgeon: [name], MD;  Location: [facility];  Service: Endoscopy;;  . RESECTION DISTAL CLAVICAL  05/07/2012   Procedure: RESECTION DISTAL CLAVICAL;  Surgeon: [name], MD;  Location: [facility]S;  Service: Orthopedics;  Laterality: Right;  . SHOULDER OPEN ROTATOR CUFF REPAIR  05/07/2012   Procedure: ROTATOR CUFF REPAIR SHOULDER OPEN;  Surgeon: [name], MD;  Location: [facility];  Service: Orthopedics;  Laterality: Right;  . TEE WITHOUT CARDIOVERSION N/A 12/26/2018   Procedure: TRANSESOPHAGEAL ECHOCARDIOGRAM (TEE) WITH PROPOFOL;  Surgeon: Arnoldo Lenis, MD;  [facility];  Service: Endoscopy;  Laterality: N/A;  . THYROIDECTOMY  2009  . TONSILLECTOMY  1958  . UMBILICAL HERNIA REPAIR  2010    There were no vitals filed for this visit.  Subjective Assessment - 09/02/19 1042    Subjective  Patient says knees are hurting today. RT knee is a 7/10, LT is a 4/10. Says she did not do much this weekend, just stayed around the house.    Limitations  Lifting;Standing;Walking;House hold activities    How long can you stand comfortably?  8 minutes    How long can you walk comfortably?  8 minutes with RW    Patient Stated Goals  get back where I can feel better and do some stuff    Currently in Pain?  Yes    Pain Score  7     Pain Location  Knee    Pain Orientation  Right    Pain Descriptors / Indicators  Aching    Pain Type  Chronic pain    Pain Onset  More than a month ago    Pain Frequency  Constant    Multiple Pain Sites  Yes    Pain Score  4    Pain Location  Knee    Pain Orientation  Left    Pain Descriptors / Indicators  Aching    Pain Type  Chronic pain    Pain Onset  More than a month ago    Pain Frequency  Constant                       [facility] Adult PT Treatment/Exercise - 09/02/19 0001      Lumbar Exercises: Aerobic   Nustep  5 min lv 2 warmup for mobility and endurance       Knee/Hip Exercises: Stretches   Knee: Self-Stretch to increase Flexion  Both;5 reps;10 seconds    Knee: Self-Stretch Limitations  on 6 inch box     Gastroc Stretch  Both;3 reps;30 seconds    Gastroc Stretch Limitations  slant board       Knee/Hip Exercises: Standing   Heel Raises  Both;20 reps    Forward Step Up  10 reps;Hand Hold: 2;Step Height: 6";Left    Forward Step Up Limitations  unable to do on RT     Gait Training  200 feet with LBCQ on LT, cues for upright posturing     Other Standing Knee Exercises  NBOS on foam with head turns x20     Other Standing Knee Exercises  tandem stance, foam, 3 x 30 sec, intermittent HHA; 6 inch alternating  box taps x 20 with HHA x 1      Knee/Hip Exercises: Seated   Sit to Sand  10 reps;without UE support  PT Short Term Goals - 07/31/19 1100      PT SHORT TERM GOAL #1   Title  Patient will be independent with initial HEP to improve functional outcomes    Time  3    Period  Weeks    Status  Achieved    Target Date  08/02/19        PT Long Term Goals - 08/21/19 1027      PT LONG TERM GOAL #1   Title  Patient will be able to perform stand x 5 in < 15 seconds to demonstrate improvement in functional mobility and reduced risk for falls.    Baseline  Current: 14.1 sec with no UE    Time  6    Period  Weeks    Status  Achieved      PT LONG TERM GOAL #2   Title  Patient will be able to maintain tandem stance >30 seconds on BLEs to improve stability and reduce risk for falls    Baseline  Current: 30 sec RT; 20 sec LT    Time  6    Period  Weeks    Status  On-going      PT LONG TERM GOAL #3   Title  Patient will be able to ambulate at least 275 feet during 2MWT with LRAD to demonstrate improved ability to perform functional mobility and associated tasks.    Baseline  Current: 235 feet with RW    Time  6    Period  Weeks    Status  On-going      PT LONG TERM GOAL #4   Title  Patient will report at least 60% overall improvement in subjective complaint to indicate improvement in ability to perform ADLs.    Baseline  Current: 65-75%    Time  6    Period  Weeks    Status  Achieved            Plan - 09/02/19 1133    Clinical Impression Statement  Patient limited by RT knee pain today, was unable to complete 10x step ups on RT side. Patient was able to progress static balance activity to tandem stance on foam, and increase reps with NBOS on foam with head turns with intermittent HHA. Patient also progressed to gait training with [facility] per improved static balance, and did fairly well with this. Patient did require verbal cues for upright posture and proper  2-point gait pattern. Patient also continues to be limited by fatigue and requires several breaks for rest throughout treatment.    Personal Factors and Comorbidities  Age;Comorbidity 3+;Fitness    Comorbidities  DM, COPD, HTN    Examination-Activity Limitations  Continence;Stairs;Locomotion Level;Squat;Lift    Examination-Participation Restrictions  Cleaning;Community Activity    Stability/Clinical Decision Making  Stable/Uncomplicated    Rehab Potential  Good    PT Frequency  2x / week    PT Duration  4 weeks    PT Treatment/Interventions  ADLs/Self Care Home Management;Aquatic Therapy;Cryotherapy;Electrical Stimulation;Moist Heat;Gait training;Stair training;Functional mobility training;DME Instruction;Therapeutic activities;Therapeutic exercise;Balance training;Neuromuscular re-education;Patient/family education;Manual techniques;Joint Manipulations;Passive range of motion;Energy conservation;Splinting;Taping    PT Next Visit Plan  Continue to progress as tolerated with focus on gait, balance, posture, endurance and functional strength.    PT Home Exercise Plan  07/10/19: seated marching, LAQ, heel/ toe raises seated; 1/27:  diapharagmic breathing, scapular retraction, abdominal isometric( round the clock), self manual to decrease abdominal lymphatic volume, sit to stand.    Consulted and  Agree with Plan of Care  Patient       Patient will benefit from skilled therapeutic intervention in order to improve the following deficits and impairments:  Abnormal gait, Decreased balance, Decreased mobility, Decreased skin integrity, Difficulty walking, Obesity, Decreased strength, Decreased activity tolerance, Decreased safety awareness, Decreased knowledge of use of DME, Increased edema, Improper body mechanics  Visit Diagnosis: Muscle weakness (generalized)  Other abnormalities of gait and mobility     Problem List Patient Active Problem List   Diagnosis Date Noted  . Acute lower UTI  06/22/2019  . Hypokalemia 06/20/2019  . AKI (acute kidney injury) ([place]) 06/01/2019  . NASH (nonalcoholic steatohepatitis) 06/01/2019  . Pressure injury of skin 05/31/2019  . Candidiasis, intertrigo 05/31/2019  . Yeast infection 05/31/2019  . Fever 12/24/2018  . Hyponatremia 12/24/2018  . Abnormal liver function 12/24/2018  . Sepsis due to undetermined organism ([place]) 11/07/2018  . Atrial fibrillation ([place]) 11/07/2018  . COPD (chronic obstructive pulmonary disease) ([place]) 11/07/2018  . GERD (gastroesophageal reflux disease) 11/07/2018  . Hypothyroidism 11/07/2018  . Sleep apnea 11/07/2018  . Hepatic cirrhosis ([place]) 09/01/2014  . Cirrhosis, nonalcoholic ([place]) 05/11/6243  . Incisional hernia 06/09/2014  . S/P complete repair of rotator cuff 07/24/2012  . Precordial pain 09/30/2010  . Essential hypertension, benign 09/30/2010  . Type 2 diabetes mellitus ([place]) 09/05/2008  . OBESITY-MORBID (>100') 09/05/2008  . ESOPHAGEAL REFLUX 09/05/2008  . EDEMA 09/05/2008   11:40 AM, 09/02/19 Josue Hector PT DPT  Physical Therapist with [facility]  [phone] [place] [address] [place], [place], [address] Phone: [phone]   Fax:  [phone]  Name: JESSY CALIXTE MRN: [id number] Date of Birth: 03-19-1956

## 2019-09-04 ENCOUNTER — Other Ambulatory Visit: Payer: Self-pay

## 2019-09-04 ENCOUNTER — Ambulatory Visit (HOSPITAL_COMMUNITY): Payer: PPO

## 2019-09-04 ENCOUNTER — Encounter (HOSPITAL_COMMUNITY): Payer: Self-pay

## 2019-09-04 DIAGNOSIS — M6281 Muscle weakness (generalized): Secondary | ICD-10-CM | POA: Diagnosis not present

## 2019-09-04 DIAGNOSIS — R2689 Other abnormalities of gait and mobility: Secondary | ICD-10-CM

## 2019-09-04 NOTE — Therapy (Signed)
[place], [place], [address] Phone: [phone]   Fax:  [phone]  Physical Therapy Treatment  Patient Details  Name: CALEY CIARAMITARO MRN: [id number] Date of Birth: 1956/05/01 Referring Provider (PT): Roxan Hockey MD   Encounter Date: 09/04/2019  PT End of Session - 09/04/19 1029    Visit Number  10    Number of Visits  15    Date for PT Re-Evaluation  09/20/19    Authorization Type  [employer] (no auth, no visit limit)    Authorization Time Period  07/10/19-08/23/19; 08/21/19-09/20/19; progress note complete 08/21/19 visit #7    Authorization - Visit Number  10    Authorization - Number of Visits  17    Progress Note Due on Visit  15    PT Start Time  1015    PT Stop Time  1100    PT Time Calculation (min)  45 min    Equipment Utilized During Treatment  Gait belt   LBQC   Activity Tolerance  Patient limited by fatigue;Patient limited by pain;No increased pain;Patient tolerated treatment well    Behavior During Therapy  [facility] for tasks assessed/performed       Past Medical History:  Diagnosis Date  . Anemia   . Anxiety   . Arthritis   . Asthma   . Atrial fibrillation ([place])   . Cirrhosis of liver ([place])   . COPD (chronic obstructive pulmonary disease) ([place])   . Coronary artery disease   . Depression   . Edema   . Essential hypertension, benign   . GERD (gastroesophageal reflux disease)   . Hypothyroidism   . Morbid obesity ([place])   . Overactive bladder   . Sleep apnea    CPAP - not consistent  . Thyroid cancer ([place])   . Type 2 diabetes mellitus ([place])     Past Surgical History:  Procedure Laterality Date  . "pump bumps"     bilateral heels--2000  . ABDOMINAL HYSTERECTOMY  1985  . CARDIAC CATHETERIZATION  2012   Dr. Lia Foyer told her nothing was wrong.  [name] [place]  . CHOLECYSTECTOMY  1996  . COLONOSCOPY WITH PROPOFOL N/A 04/17/2015   Procedure: COLONOSCOPY WITH PROPOFOL  at  cecum at 0810; withdrawal time =15 minutes;  Surgeon: [name], MD;  [facility];  Service: Endoscopy;  Laterality: N/A;  . Debridement of abdominal wound  2011  . ESOPHAGEAL DILATION N/A 04/17/2015   Procedure: ESOPHAGEAL DILATION WITH 56FR MALONEY DILATOR;  Surgeon: [name], MD;  [facility];  Service: Endoscopy;  Laterality: N/A;  . ESOPHAGOGASTRODUODENOSCOPY (EGD) WITH PROPOFOL N/A 04/17/2015   Procedure: ESOPHAGOGASTRODUODENOSCOPY (EGD) WITH PROPOFOL;  Surgeon: [name], MD;  [facility]: [facility];  Service: Endoscopy;  Laterality: N/A;  . EXPLORATORY LAPAROTOMY  2003  . FOOT SURGERY Bilateral 2000   Bone spur removed  . FOOT SURGERY    . INCISIONAL HERNIA REPAIR N/A 06/09/2014   Procedure: RECURRENT  INCISIONAL HERNIORRHAPHY WITH MESH;  Surgeon: [name], MD;  [facility]: [facility];  Service: General;  Laterality: N/A;  . INCISIONAL HERNIA REPAIR N/A 10/15/2014   Procedure: [place];  Surgeon: [name], MD;  Location: [place];  Service: General;  Laterality: N/A;  . INSERTION OF MESH N/A 06/09/2014   Procedure: INSERTION OF MESH;  Surgeon: [name], MD;  [facility];  Service: General;  Laterality: N/A;  . INSERTION  OF MESH N/A 10/15/2014   Procedure: INSERTION OF MESH;  Surgeon: [name], MD;  Location: [place];  Service: General;  Laterality: N/A;  . POLYPECTOMY  04/17/2015   Procedure: POLYPECTOMY;  Surgeon: [name], MD;  [facility];  Service: Endoscopy;;  . RESECTION DISTAL CLAVICAL  05/07/2012   Procedure: RESECTION DISTAL CLAVICAL;  Surgeon: [name], MD;  [facility];  Service: Orthopedics;  Laterality: Right;  . SHOULDER OPEN ROTATOR CUFF REPAIR  05/07/2012   Procedure: ROTATOR CUFF REPAIR SHOULDER OPEN;  Surgeon: [name], MD;  [facility];  Service: Orthopedics;  Laterality: Right;  . TEE WITHOUT CARDIOVERSION N/A 12/26/2018   Procedure: TRANSESOPHAGEAL  ECHOCARDIOGRAM (TEE) WITH PROPOFOL;  Surgeon: Arnoldo Lenis, MD;  [facility];  Service: Endoscopy;  Laterality: N/A;  . THYROIDECTOMY  2009  . TONSILLECTOMY  1958  . UMBILICAL HERNIA REPAIR  2010    There were no vitals filed for this visit.  Subjective Assessment - 09/04/19 1027    Subjective  Pt stated her Rt knee is bothering her today, pain scale 8/10.    Patient Stated Goals  get back where I can feel better and do some stuff    Currently in Pain?  Yes    Pain Score  8     Pain Location  Knee    Pain Orientation  Right    Pain Descriptors / Indicators  Aching;Sore    Pain Type  Chronic pain    Pain Onset  More than a month ago    Pain Frequency  Constant    Aggravating Factors   WB    Pain Relieving Factors  rest,meds    Effect of Pain on Daily Activities  limits                       [facility] Adult PT Treatment/Exercise - 09/04/19 0001      Exercises   Exercises  Knee/Hip      Lumbar Exercises: Aerobic   Nustep  5 min lv 3 warmup for mobility and endurance       Lumbar Exercises: Machines for Strengthening   Leg Press  attempted      Lumbar Exercises: Seated   Other Seated Lumbar Exercises  scapular retraction 20x GTB      Knee/Hip Exercises: Standing   Heel Raises  Both;20 reps    Heel Raises Limitations  toe raises 10x    Forward Lunges  Both;10 reps    Forward Lunges Limitations  4in step    Gait Training  200 feet with LBCQ on LT, cues for upright posturing     Other Standing Knee Exercises  NBOS on foam with head turns x20; standing tall against wall    Other Standing Knee Exercises  tandem stance, foam, 3 x 30 sec, intermittent HHA; 6 inch alternating box taps x 20 with HHA x 1      Knee/Hip Exercises: Seated   Sit to Sand  10 reps;without UE support               PT Short Term Goals - 07/31/19 1100      PT SHORT TERM GOAL #1   Title  Patient will be independent with initial HEP to improve functional outcomes     Time  3    Period  Weeks    Status  Achieved    Target Date  08/02/19  PT Long Term Goals - 08/21/19 1027      PT LONG TERM GOAL #1   Title  Patient will be able to perform stand x 5 in < 15 seconds to demonstrate improvement in functional mobility and reduced risk for falls.    Baseline  Current: 14.1 sec with no UE    Time  6    Period  Weeks    Status  Achieved      PT LONG TERM GOAL #2   Title  Patient will be able to maintain tandem stance >30 seconds on BLEs to improve stability and reduce risk for falls    Baseline  Current: 30 sec RT; 20 sec LT    Time  6    Period  Weeks    Status  On-going      PT LONG TERM GOAL #3   Title  Patient will be able to ambulate at least 275 feet during 2MWT with LRAD to demonstrate improved ability to perform functional mobility and associated tasks.    Baseline  Current: 235 feet with RW    Time  6    Period  Weeks    Status  On-going      PT LONG TERM GOAL #4   Title  Patient will report at least 60% overall improvement in subjective complaint to indicate improvement in ability to perform ADLs.    Baseline  Current: 65-75%    Time  6    Period  Weeks    Status  Achieved            Plan - 09/04/19 1113    Clinical Impression Statement  Pt educated on importance of posture to assist with Rt knee and back pain as well as balance.  Required cueing for posture through session.  Added lunges for functional strengthening and attempted leg press, pt unable to due abdominal size and knee mobility.  NO reorts of increased pain through session.  Did require standing and seated rest breaks for fatigue through session.  EOS with nustep for activity tolerance, not included wiht charges.    Personal Factors and Comorbidities  Age;Comorbidity 3+;Fitness    Comorbidities  DM, COPD, HTN    Examination-Activity Limitations  Continence;Stairs;Locomotion Level;Squat;Lift    Examination-Participation Restrictions  Cleaning;Community  Activity    Stability/Clinical Decision Making  Stable/Uncomplicated    Rehab Potential  Good    PT Frequency  2x / week    PT Duration  4 weeks    PT Treatment/Interventions  ADLs/Self Care Home Management;Aquatic Therapy;Cryotherapy;Electrical Stimulation;Moist Heat;Gait training;Stair training;Functional mobility training;DME Instruction;Therapeutic activities;Therapeutic exercise;Balance training;Neuromuscular re-education;Patient/family education;Manual techniques;Joint Manipulations;Passive range of motion;Energy conservation;Splinting;Taping    PT Next Visit Plan  Continue to progress as tolerated with focus on gait, balance, posture, endurance and functional strength.    PT Home Exercise Plan  07/10/19: seated marching, LAQ, heel/ toe raises seated; 1/27:  diapharagmic breathing, scapular retraction, abdominal isometric( round the clock), self manual to decrease abdominal lymphatic volume, sit to stand.       Patient will benefit from skilled therapeutic intervention in order to improve the following deficits and impairments:  Abnormal gait, Decreased balance, Decreased mobility, Decreased skin integrity, Difficulty walking, Obesity, Decreased strength, Decreased activity tolerance, Decreased safety awareness, Decreased knowledge of use of DME, Increased edema, Improper body mechanics  Visit Diagnosis: Other abnormalities of gait and mobility  Muscle weakness (generalized)     Problem List Patient Active Problem List   Diagnosis Date Noted  .  Acute lower UTI 06/22/2019  . Hypokalemia 06/20/2019  . AKI (acute kidney injury) ([place]) 06/01/2019  . NASH (nonalcoholic steatohepatitis) 06/01/2019  . Pressure injury of skin 05/31/2019  . Candidiasis, intertrigo 05/31/2019  . Yeast infection 05/31/2019  . Fever 12/24/2018  . Hyponatremia 12/24/2018  . Abnormal liver function 12/24/2018  . Sepsis due to undetermined organism ([place]) 11/07/2018  . Atrial fibrillation ([place]) 11/07/2018  .  COPD (chronic obstructive pulmonary disease) ([place]) 11/07/2018  . GERD (gastroesophageal reflux disease) 11/07/2018  . Hypothyroidism 11/07/2018  . Sleep apnea 11/07/2018  . Hepatic cirrhosis ([place]) 09/01/2014  . Cirrhosis, nonalcoholic ([place]) 16/12/3708  . Incisional hernia 06/09/2014  . S/P complete repair of rotator cuff 07/24/2012  . Precordial pain 09/30/2010  . Essential hypertension, benign 09/30/2010  . Type 2 diabetes mellitus ([place]) 09/05/2008  . OBESITY-MORBID (>100') 09/05/2008  . ESOPHAGEAL REFLUX 09/05/2008  . EDEMA 09/05/2008   Ihor Austin, LPTA/CLT; CBIS [phone]  Aldona Lento 09/04/2019, 11:19 AM  [place] [address] [place], [place], [address] Phone: [phone]   Fax:  [phone]  Name: AMERE IOTT MRN: [id number] Date of Birth: [birth year]

## 2019-09-09 ENCOUNTER — Other Ambulatory Visit: Payer: Self-pay

## 2019-09-09 ENCOUNTER — Ambulatory Visit (HOSPITAL_COMMUNITY): Payer: PPO

## 2019-09-09 ENCOUNTER — Encounter (HOSPITAL_COMMUNITY): Payer: Self-pay

## 2019-09-09 DIAGNOSIS — M6281 Muscle weakness (generalized): Secondary | ICD-10-CM

## 2019-09-09 DIAGNOSIS — R2689 Other abnormalities of gait and mobility: Secondary | ICD-10-CM

## 2019-09-09 NOTE — Therapy (Signed)
[place], [place], [address] Phone: [phone]   Fax:  [phone]  Physical Therapy Treatment  Patient Details  Name: [name] [name] MRN: [id number] Date of Birth: 12-08-1955 Referring Provider (PT): Roxan Hockey MD   Encounter Date: 09/09/2019  PT End of Session - 09/09/19 1109    Visit Number  11    Number of Visits  15    Date for PT Re-Evaluation  09/20/19    Authorization Type  [employer] (no auth, no visit limit)    Authorization Time Period  07/10/19-08/23/19; 08/21/19-09/20/19; progress note complete 08/21/19 visit #7    Authorization - Visit Number  11    Authorization - Number of Visits  17    Progress Note Due on Visit  15    PT Start Time  1110    PT Stop Time  1151    PT Time Calculation (min)  41 min    Equipment Utilized During Treatment  Gait belt   RW   Activity Tolerance  Patient tolerated treatment well;Patient limited by pain    Behavior During Therapy  [facility] for tasks assessed/performed       Past Medical History:  Diagnosis Date  . Anemia   . Anxiety   . Arthritis   . Asthma   . Atrial fibrillation ([place])   . Cirrhosis of liver ([place])   . COPD (chronic obstructive pulmonary disease) ([place])   . Coronary artery disease   . Depression   . Edema   . Essential hypertension, benign   . GERD (gastroesophageal reflux disease)   . Hypothyroidism   . Morbid obesity ([place])   . Overactive bladder   . Sleep apnea    CPAP - not consistent  . Thyroid cancer ([place])   . Type 2 diabetes mellitus ([place])     Past Surgical History:  Procedure Laterality Date  . "pump bumps"     bilateral heels--2000  . ABDOMINAL HYSTERECTOMY  1985  . CARDIAC CATHETERIZATION  2012   Dr. Lia Foyer told her nothing was wrong.  [name] [place]  . CHOLECYSTECTOMY  1996  . COLONOSCOPY WITH PROPOFOL N/A 04/17/2015   Procedure: COLONOSCOPY WITH PROPOFOL  at cecum at 0810; withdrawal time =15 minutes;   Surgeon: [name], MD;  [facility];  Service: Endoscopy;  Laterality: N/A;  . Debridement of abdominal wound  2011  . ESOPHAGEAL DILATION N/A 04/17/2015   Procedure: ESOPHAGEAL DILATION WITH 56FR MALONEY DILATOR;  Surgeon: [name], MD;  [facility];  Service: Endoscopy;  Laterality: N/A;  . ESOPHAGOGASTRODUODENOSCOPY (EGD) WITH PROPOFOL N/A 04/17/2015   Procedure: ESOPHAGOGASTRODUODENOSCOPY (EGD) WITH PROPOFOL;  Surgeon: [name], MD;  [facility];  Service: Endoscopy;  Laterality: N/A;  . EXPLORATORY LAPAROTOMY  2003  . FOOT SURGERY Bilateral 2000   Bone spur removed  . FOOT SURGERY    . INCISIONAL HERNIA REPAIR N/A 06/09/2014   Procedure: RECURRENT  INCISIONAL HERNIORRHAPHY WITH MESH;  Surgeon: [name], MD;  [facility]: [facility];  Service: General;  Laterality: N/A;  . INCISIONAL HERNIA REPAIR N/A 10/15/2014   Procedure: [place];  Surgeon: [name], MD;  Location: [place];  Service: General;  Laterality: N/A;  . INSERTION OF MESH N/A 06/09/2014   Procedure: INSERTION OF MESH;  Surgeon: [name], MD;  [facility];  Service: General;  Laterality: N/A;  . INSERTION OF MESH N/A 10/15/2014  Procedure: INSERTION OF MESH;  Surgeon: [name], MD;  Location: [name];  Service: General;  Laterality: N/A;  . POLYPECTOMY  04/17/2015   Procedure: POLYPECTOMY;  Surgeon: [name], MD;  [facility]: [facility];  Service: Endoscopy;;  . RESECTION DISTAL CLAVICAL  05/07/2012   Procedure: RESECTION DISTAL CLAVICAL;  Surgeon: [name], MD;  [facility]: [facility];  Service: Orthopedics;  Laterality: Right;  . SHOULDER OPEN ROTATOR CUFF REPAIR  05/07/2012   Procedure: ROTATOR CUFF REPAIR SHOULDER OPEN;  Surgeon: [name], MD;  [facility];  Service: Orthopedics;  Laterality: Right;  . TEE WITHOUT CARDIOVERSION N/A 12/26/2018   Procedure: TRANSESOPHAGEAL ECHOCARDIOGRAM (TEE) WITH PROPOFOL;  Surgeon:  Arnoldo Lenis, MD;  [facility];  Service: Endoscopy;  Laterality: N/A;  . THYROIDECTOMY  2009  . TONSILLECTOMY  1958  . UMBILICAL HERNIA REPAIR  2010    There were no vitals filed for this visit.  Subjective Assessment - 09/09/19 1113    Subjective  Pt reports she was able to walk to the clinic from [place] next door. Pt reports R knee pain today.    Limitations  Lifting;Standing;Walking;House hold activities    How long can you stand comfortably?  8 minutes    How long can you walk comfortably?  8 minutes with RW    Patient Stated Goals  get back where I can feel better and do some stuff    Currently in Pain?  Yes    Pain Score  6     Pain Location  Knee    Pain Orientation  Right    Pain Descriptors / Indicators  Aching;Sore    Pain Type  Chronic pain    Pain Onset  More than a month ago    Pain Frequency  Constant    Aggravating Factors   WB    Pain Relieving Factors  rest, meds    Effect of Pain on Daily Activities  limits            [facility] Adult PT Treatment/Exercise - 09/09/19 0001      Knee/Hip Exercises: Standing   Heel Raises  20 reps    Heel Raises Limitations  toe raises x20 reps    Functional Squat  5 reps    Functional Squat Limitations  stopped due to R knee pain    Gait Training  tandem stance, BLE extension x10 reps each leg back; tandem stance, eye gaze up/down x10 reps each leg back    Other Standing Knee Exercises  stepping over 6" and 12" hurdles in // bars, x2RT    Other Standing Knee Exercises  alternating foot taps, 6" box, single HHA, x20 reps      Knee/Hip Exercises: Seated   Long Arc Quad  Both;2 sets;15 reps    Long Arc Quad Weight  3 lbs.    Marching  Both;2 sets;20 reps    Marching Limitations  3    Sit to Sand  2 sets;10 reps;without UE support             PT Education - 09/09/19 1109    Education Details  Continue HEP, exercise technique    Person(s) Educated  Patient    Methods   Explanation;Demonstration    Comprehension  Verbalized understanding;Returned demonstration       PT Short Term Goals - 07/31/19 1100      PT SHORT TERM GOAL #1   Title  Patient will be independent  with initial HEP to improve functional outcomes    Time  3    Period  Weeks    Status  Achieved    Target Date  08/02/19        PT Long Term Goals - 08/21/19 1027      PT LONG TERM GOAL #1   Title  Patient will be able to perform stand x 5 in < 15 seconds to demonstrate improvement in functional mobility and reduced risk for falls.    Baseline  Current: 14.1 sec with no UE    Time  6    Period  Weeks    Status  Achieved      PT LONG TERM GOAL #2   Title  Patient will be able to maintain tandem stance >30 seconds on BLEs to improve stability and reduce risk for falls    Baseline  Current: 30 sec RT; 20 sec LT    Time  6    Period  Weeks    Status  On-going      PT LONG TERM GOAL #3   Title  Patient will be able to ambulate at least 275 feet during 2MWT with LRAD to demonstrate improved ability to perform functional mobility and associated tasks.    Baseline  Current: 235 feet with RW    Time  6    Period  Weeks    Status  On-going      PT LONG TERM GOAL #4   Title  Patient will report at least 60% overall improvement in subjective complaint to indicate improvement in ability to perform ADLs.    Baseline  Current: 65-75%    Time  6    Period  Weeks    Status  Achieved            Plan - 09/09/19 1110    Clinical Impression Statement  Added seated weighted exercises this date due to R knee pain with mobility. Added 6" and 12" hurdles in parallel bars for balance challenge, but pt continues to require bil hand assist on bars for balancing. Pt also limited by decreased bil hip/knee flexion due to abdominal mass and demonstrates a circumduction compensation method to clear hurdles despite cues. Added tandem stance with BUE flexion and head turns to challenge balance, pt  requiring intermittent UE assist on parallel bars to upright self and unable to demonstrate protective stepping to regain balance despite cues and demonstration. Pt requires frequent seated rest breaks due to fatiguing with exercises. Continue to progress as able.    Personal Factors and Comorbidities  Age;Comorbidity 3+;Fitness    Comorbidities  DM, COPD, HTN    Examination-Activity Limitations  Continence;Stairs;Locomotion Level;Squat;Lift    Examination-Participation Restrictions  Cleaning;Community Activity    Stability/Clinical Decision Making  Stable/Uncomplicated    Rehab Potential  Good    PT Frequency  2x / week    PT Duration  4 weeks    PT Treatment/Interventions  ADLs/Self Care Home Management;Aquatic Therapy;Cryotherapy;Electrical Stimulation;Moist Heat;Gait training;Stair training;Functional mobility training;DME Instruction;Therapeutic activities;Therapeutic exercise;Balance training;Neuromuscular re-education;Patient/family education;Manual techniques;Joint Manipulations;Passive range of motion;Energy conservation;Splinting;Taping    PT Next Visit Plan  Progress BLE strength, gait, balance, posture, and endurance.    PT Home Exercise Plan  07/10/19: seated marching, LAQ, heel/ toe raises seated; 1/27:  diapharagmic breathing, scapular retraction, abdominal isometric( round the clock), self manual to decrease abdominal lymphatic volume, sit to stand.    Consulted and Agree with Plan of Care  Patient  Patient will benefit from skilled therapeutic intervention in order to improve the following deficits and impairments:  Abnormal gait, Decreased balance, Decreased mobility, Decreased skin integrity, Difficulty walking, Obesity, Decreased strength, Decreased activity tolerance, Decreased safety awareness, Decreased knowledge of use of DME, Increased edema, Improper body mechanics  Visit Diagnosis: Muscle weakness (generalized)  Other abnormalities of gait and  mobility     Problem List Patient Active Problem List   Diagnosis Date Noted  . Acute lower UTI 06/22/2019  . Hypokalemia 06/20/2019  . AKI (acute kidney injury) ([place]) 06/01/2019  . NASH (nonalcoholic steatohepatitis) 06/01/2019  . Pressure injury of skin 05/31/2019  . Candidiasis, intertrigo 05/31/2019  . Yeast infection 05/31/2019  . Fever 12/24/2018  . Hyponatremia 12/24/2018  . Abnormal liver function 12/24/2018  . Sepsis due to undetermined organism ([place]) 11/07/2018  . Atrial fibrillation ([place]) 11/07/2018  . COPD (chronic obstructive pulmonary disease) ([place]) 11/07/2018  . GERD (gastroesophageal reflux disease) 11/07/2018  . Hypothyroidism 11/07/2018  . Sleep apnea 11/07/2018  . Hepatic cirrhosis ([place]) 09/01/2014  . Cirrhosis, nonalcoholic ([place]) 16/24/4695  . Incisional hernia 06/09/2014  . S/P complete repair of rotator cuff 07/24/2012  . Precordial pain 09/30/2010  . Essential hypertension, benign 09/30/2010  . Type 2 diabetes mellitus ([place]) 09/05/2008  . OBESITY-MORBID (>100') 09/05/2008  . ESOPHAGEAL REFLUX 09/05/2008  . EDEMA 09/05/2008     Talbot Grumbling PT, DPT 09/09/19, 11:57 AM [place] [address] [place], [place], [address] Phone: [phone]   Fax:  [phone]  Name: [name] [name] MRN: [id number] Date of Birth: [birth year]

## 2019-09-11 ENCOUNTER — Telehealth (HOSPITAL_COMMUNITY): Payer: Self-pay | Admitting: Physical Therapy

## 2019-09-11 ENCOUNTER — Encounter: Payer: Self-pay | Admitting: Cardiology

## 2019-09-11 ENCOUNTER — Ambulatory Visit (HOSPITAL_COMMUNITY): Payer: PPO | Admitting: Physical Therapy

## 2019-09-11 NOTE — Telephone Encounter (Signed)
Pt called to cx -She is not coming today her knee is giving her a fit.

## 2019-09-11 NOTE — Progress Notes (Signed)
Virtual Visit via Telephone Note   This visit type was conducted due to national recommendations for restrictions regarding the COVID-19 Pandemic (e.g. social distancing) in an effort to limit this patient's exposure and mitigate transmission in our community.  Due to her co-morbid illnesses, this patient is at least at moderate risk for complications without adequate follow up.  This format is felt to be most appropriate for this patient at this time.  The patient did not have access to video technology/had technical difficulties with video requiring transitioning to audio format only (telephone).  All issues noted in this document were discussed and addressed.  No physical exam could be performed with this format.  Please refer to the patient's chart for her  consent to telehealth for [facility].   The patient was identified using 2 identifiers.  Date:  09/12/2019   ID:  [name] [name], DOB 1956/04/11, MRN [id number]  Patient Location: Home Provider Location: Office  PCP:  Redmond School, MD  Cardiologist:  Rozann Lesches, MD Electrophysiologist:  None   Evaluation Performed:  Follow-Up Visit  Chief Complaint:  Cardiac follow-up  History of Present Illness:    [name] [name] is a 64 y.o. female last assessed via telehealth encounter in August 2020 by Ms. Strader PA-C.  We spoke by phone today.  She does not report any sense of prolonged palpitations, typically brief episodes.  She has had some mechanical falls but no syncope.  I reviewed her medications which are outlined below.  No spontaneous bleeding problems reported on Eliquis.  She does have a history of anemia.  Hemoglobin was 8.5 in January, previously 9.3.  I asked her to make sure that she gets a follow-up check when she sees Dr. Gerarda Fraction coming up.  She otherwise continues on diltiazem CD 360 mg daily.   Past Medical History:  Diagnosis Date  . Anemia   . Anxiety   . Arthritis   . Asthma   . Atrial fibrillation  ([place])   . Cirrhosis of liver ([place])   . COPD (chronic obstructive pulmonary disease) ([place])   . Coronary artery disease   . Depression   . Essential hypertension   . GERD (gastroesophageal reflux disease)   . Hypothyroidism   . Morbid obesity ([place])   . Overactive bladder   . Sleep apnea    CPAP - not consistent  . Thyroid cancer ([place])   . Type 2 diabetes mellitus ([place])    Past Surgical History:  Procedure Laterality Date  . "pump bumps"     bilateral heels--2000  . ABDOMINAL HYSTERECTOMY  1985  . CARDIAC CATHETERIZATION  2012   Dr. Lia Foyer told her nothing was wrong.  [name] [place]  . CHOLECYSTECTOMY  1996  . COLONOSCOPY WITH PROPOFOL N/A 04/17/2015   Procedure: COLONOSCOPY WITH PROPOFOL  at cecum at 0810; withdrawal time =15 minutes;  Surgeon: [name], MD;  Location: [facility];  Service: Endoscopy;  Laterality: N/A;  . Debridement of abdominal wound  2011  . ESOPHAGEAL DILATION N/A 04/17/2015   Procedure: ESOPHAGEAL DILATION WITH 56FR MALONEY DILATOR;  Surgeon: [name], MD;  Location: [facility];  Service: Endoscopy;  Laterality: N/A;  . ESOPHAGOGASTRODUODENOSCOPY (EGD) WITH PROPOFOL N/A 04/17/2015   Procedure: ESOPHAGOGASTRODUODENOSCOPY (EGD) WITH PROPOFOL;  Surgeon: [name], MD;  Location: [facility];  Service: Endoscopy;  Laterality: N/A;  . EXPLORATORY LAPAROTOMY  2003  . FOOT SURGERY Bilateral 2000   Bone spur removed  . FOOT  SURGERY    . INCISIONAL HERNIA REPAIR N/A 06/09/2014   Procedure: RECURRENT  INCISIONAL HERNIORRHAPHY WITH MESH;  Surgeon: [name], MD;  Location: [facility];  Service: General;  Laterality: N/A;  . INCISIONAL HERNIA REPAIR N/A 10/15/2014   Procedure: [place];  Surgeon: [name], MD;  Location: [place];  Service: General;  Laterality: N/A;  . INSERTION OF MESH N/A 06/09/2014   Procedure: INSERTION OF MESH;  Surgeon: [name], MD;  Location: [facility];  Service: General;  Laterality: N/A;   . INSERTION OF MESH N/A 10/15/2014   Procedure: INSERTION OF MESH;  Surgeon: [name], MD;  Location: [place];  Service: General;  Laterality: N/A;  . POLYPECTOMY  04/17/2015   Procedure: POLYPECTOMY;  Surgeon: [name], MD;  Location: [facility];  Service: Endoscopy;;  . RESECTION DISTAL CLAVICAL  05/07/2012   Procedure: RESECTION DISTAL CLAVICAL;  Surgeon: [name], MD;  Location: [facility];  Service: Orthopedics;  Laterality: Right;  . SHOULDER OPEN ROTATOR CUFF REPAIR  05/07/2012   Procedure: ROTATOR CUFF REPAIR SHOULDER OPEN;  Surgeon: [name], MD;  Location: [facility];  Service: Orthopedics;  Laterality: Right;  . TEE WITHOUT CARDIOVERSION N/A 12/26/2018   Procedure: TRANSESOPHAGEAL ECHOCARDIOGRAM (TEE) WITH PROPOFOL;  Surgeon: Arnoldo Lenis, MD;  [facility];  Service: Endoscopy;  Laterality: N/A;  . THYROIDECTOMY  2009  . TONSILLECTOMY  1958  . UMBILICAL HERNIA REPAIR  2010     Current Meds  Medication Sig  . acetaminophen (TYLENOL) 325 MG tablet Take 2 tablets (650 mg total) by mouth every 6 (six) hours as needed for mild pain (or Fever >/= 101).  [name] albuterol (PROVENTIL HFA) 108 (90 BASE) MCG/ACT inhaler Inhale 2 puffs into the lungs every 6 (six) hours as needed for wheezing or shortness of breath. Shortness of breath  . apixaban (ELIQUIS) 5 MG TABS tablet Take 1 tablet (5 mg total) by mouth 2 (two) times daily.  . colesevelam (WELCHOL) 625 MG tablet Take 1,875 mg by mouth as directed. 3 tabs am 3 tabs pm  . diltiazem (TIAZAC) 360 MG 24 hr capsule TAKE ONE CAPSULE (360MG TOTAL) BY MOUTH DAILY (Patient taking differently: Take 360 mg by mouth daily. )  . DULoxetine (CYMBALTA) 60 MG capsule Take 120 mg by mouth daily.   [name] econazole nitrate 1 % cream Apply topically daily. (Patient taking differently: Apply 1 application topically daily as needed (for rash irritation). )  . gabapentin (NEURONTIN) 300 MG capsule Take 300 mg by mouth daily.  [name]  HYDROcodone-acetaminophen (NORCO/VICODIN) 5-325 MG tablet Take 1 tablet by mouth every 6 (six) hours as needed for moderate pain or severe pain.  . hydrOXYzine (ATARAX/VISTARIL) 25 MG tablet Take 25 mg by mouth 4 (four) times daily as needed for itching.   . insulin aspart (NOVOLOG) 100 UNIT/ML injection insulin aspart (novoLOG) injection 0-9 Units 0-9 Units Subcutaneous, 3 times daily with meals CBG < 70: Implement Hypoglycemia Standing Orders and refer to Hypoglycemia Standing Orders sidebar report  CBG 70 - 120: 0 units  CBG 121 - 150: 0 unit CBG 151 - 200: 2 units CBG 201 - 250: 3 units CBG 251 - 300: 5 units CBG 301 - 350: 7 units  CBG 351 - 400: 9 units CBG > 400: 10 units  . LANTUS SOLOSTAR 100 UNIT/ML Solostar Pen Inject 33 Units into the skin at bedtime.   [name] levothyroxine (SYNTHROID) 200 MCG tablet Take 200  mcg by mouth daily before breakfast. Take in addition to 75 mcg for a total of 275 mcg daily  . levothyroxine (SYNTHROID) 75 MCG tablet Take 1 tablet (75 mcg total) by mouth daily before breakfast.  . magnesium oxide (MAG-OX) 400 (241.3 Mg) MG tablet Take 1 tablet (400 mg total) by mouth daily.  . methocarbamol (ROBAXIN) 500 MG tablet Take 1 tablet (500 mg total) by mouth every 6 (six) hours as needed for muscle spasms.  [name] nystatin cream (MYCOSTATIN) Apply to affected area 2 times daily  . potassium chloride SA (K-DUR,KLOR-CON) 20 MEQ tablet Take 20 mEq by mouth 2 (two) times daily.   . pravastatin (PRAVACHOL) 20 MG tablet Take 20 mg by mouth daily.  [name] senna-docusate (SENOKOT-S) 8.6-50 MG tablet Take 2 tablets by mouth at bedtime.  . tolterodine (DETROL LA) 4 MG 24 hr capsule Take 4 mg by mouth 2 (two) times a day.  . traZODone (DESYREL) 150 MG tablet Take 1 tablet (150 mg total) by mouth at bedtime.     Allergies:   Doxycycline, Adhesive [tape], Biaxin [clarithromycin], Percocet [oxycodone-acetaminophen], Xarelto [rivaroxaban], Clarithromycin, Clindamycin/lincomycin, Neosporin  [neomycin-polymyxin b gu], and Penicillins   ROS:   Chronic knee pain.  Prior CV studies:   The following studies were reviewed today:  TEE 12/26/2018: 1. The left ventricle has normal systolic function, with an ejection  fraction of 55-60%. The cavity size was normal.  2. Left atrial size was LA is enlarged.  3. Normal LA appendage without thrombus, normal emptying velocities.  4. No evidence of mitral valve stenosis.  5. The aortic valve is tricuspid No stenosis of the aortic valve.  6. The aortic root is normal in size and structure.  7. The interatrial septum appears to be lipomatous.  8. No evidence of vegetations   Labs/Other Tests and Data Reviewed:    EKG:  An ECG dated 06/20/2019 was personally reviewed today and demonstrated:  Sinus tachycardia with frequent PACs and nonspecific ST changes.  Recent Labs: 12/24/2018: TSH 2.298 05/30/2019: B Natriuretic Peptide 25.0 06/20/2019: ALT 18 06/25/2019: Magnesium 1.9 06/27/2019: BUN 20; Creatinine, Ser 0.86; Hemoglobin 8.5; Platelets 120; Potassium 3.5; Sodium 136    Wt Readings from Last 3 Encounters:  06/07/19 294 lb (133.4 kg)  05/30/19 134 lb (60.8 kg)  05/29/19 295 lb 6.7 oz (134 kg)     Objective:    Vital Signs:  There were no vitals taken for this visit.   Not able to obtain vital signs today. Patient spoke in full sentences, not short of breath. No audible wheezing or coughing.  ASSESSMENT & PLAN:    1.  Persistent atrial fibrillation, CHA2DS2-VASc score is 5.  She continues on Eliquis for stroke prophylaxis and Cardizem CD 360 mg daily.  No progressive sense of palpitations, continue with current plan and observation.  She does have a history of chronic anemia, recommended follow-up lab work with PCP.  2.  Mixed hyperlipidemia, continue Pravachol.   Time:   Today, I have spent 6 minutes with the patient with telehealth technology discussing the above problems.     Medication Adjustments/Labs and Tests  Ordered: Current medicines are reviewed at length with the patient today.  Concerns regarding medicines are outlined above.   Tests Ordered: No orders of the defined types were placed in this encounter.   Medication Changes: No orders of the defined types were placed in this encounter.   Follow Up:  In Person 6 months in the [place] office.  Signed,  Rozann Lesches, MD  09/12/2019 11:07 AM    [place]

## 2019-09-12 ENCOUNTER — Encounter: Payer: Self-pay | Admitting: Cardiology

## 2019-09-12 ENCOUNTER — Telehealth (INDEPENDENT_AMBULATORY_CARE_PROVIDER_SITE_OTHER): Payer: PPO | Admitting: Cardiology

## 2019-09-12 DIAGNOSIS — I10 Essential (primary) hypertension: Secondary | ICD-10-CM

## 2019-09-12 DIAGNOSIS — E782 Mixed hyperlipidemia: Secondary | ICD-10-CM

## 2019-09-12 DIAGNOSIS — I4819 Other persistent atrial fibrillation: Secondary | ICD-10-CM

## 2019-09-12 NOTE — Patient Instructions (Signed)

## 2019-09-16 ENCOUNTER — Ambulatory Visit (HOSPITAL_COMMUNITY): Payer: PPO | Admitting: Physical Therapy

## 2019-09-16 ENCOUNTER — Telehealth (HOSPITAL_COMMUNITY): Payer: Self-pay | Admitting: Physical Therapy

## 2019-09-16 NOTE — Telephone Encounter (Signed)
Called patient about today's missed appt. No answer, left voice message reminder of upcoming appointment on 09/18/19.  2:08 PM, 09/16/19 Josue Hector PT DPT  Physical Therapist with [facility]  [phone]

## 2019-09-18 ENCOUNTER — Telehealth (HOSPITAL_COMMUNITY): Payer: Self-pay | Admitting: Physical Therapy

## 2019-09-18 ENCOUNTER — Ambulatory Visit (HOSPITAL_COMMUNITY): Payer: PPO | Admitting: Physical Therapy

## 2019-09-18 DIAGNOSIS — E1140 Type 2 diabetes mellitus with diabetic neuropathy, unspecified: Secondary | ICD-10-CM | POA: Diagnosis not present

## 2019-09-18 DIAGNOSIS — Z6841 Body Mass Index (BMI) 40.0 and over, adult: Secondary | ICD-10-CM | POA: Diagnosis not present

## 2019-09-18 DIAGNOSIS — I471 Supraventricular tachycardia: Secondary | ICD-10-CM | POA: Diagnosis not present

## 2019-09-18 DIAGNOSIS — K7460 Unspecified cirrhosis of liver: Secondary | ICD-10-CM | POA: Diagnosis not present

## 2019-09-18 DIAGNOSIS — I10 Essential (primary) hypertension: Secondary | ICD-10-CM | POA: Diagnosis not present

## 2019-09-18 DIAGNOSIS — M170 Bilateral primary osteoarthritis of knee: Secondary | ICD-10-CM | POA: Diagnosis not present

## 2019-09-18 DIAGNOSIS — Z1389 Encounter for screening for other disorder: Secondary | ICD-10-CM | POA: Diagnosis not present

## 2019-09-18 DIAGNOSIS — Z0001 Encounter for general adult medical examination with abnormal findings: Secondary | ICD-10-CM | POA: Diagnosis not present

## 2019-09-18 DIAGNOSIS — E1165 Type 2 diabetes mellitus with hyperglycemia: Secondary | ICD-10-CM | POA: Diagnosis not present

## 2019-09-18 DIAGNOSIS — E063 Autoimmune thyroiditis: Secondary | ICD-10-CM | POA: Diagnosis not present

## 2019-09-18 DIAGNOSIS — E7849 Other hyperlipidemia: Secondary | ICD-10-CM | POA: Diagnosis not present

## 2019-09-18 DIAGNOSIS — F321 Major depressive disorder, single episode, moderate: Secondary | ICD-10-CM | POA: Diagnosis not present

## 2019-09-18 DIAGNOSIS — E538 Deficiency of other specified B group vitamins: Secondary | ICD-10-CM | POA: Diagnosis not present

## 2019-09-18 DIAGNOSIS — M159 Polyosteoarthritis, unspecified: Secondary | ICD-10-CM | POA: Diagnosis not present

## 2019-09-18 DIAGNOSIS — M1991 Primary osteoarthritis, unspecified site: Secondary | ICD-10-CM | POA: Diagnosis not present

## 2019-09-18 NOTE — Telephone Encounter (Signed)
Called patient about missed appointment today. Left voice message letting her know today was last scheduled appointment and to call and schedule, or will be DC from therapy.   11:54 AM, 09/18/19 Josue Hector PT DPT  Physical Therapist with [facility]  [phone]

## 2019-09-25 ENCOUNTER — Ambulatory Visit (HOSPITAL_COMMUNITY): Payer: PPO | Admitting: Physical Therapy

## 2019-09-26 ENCOUNTER — Other Ambulatory Visit: Payer: Self-pay

## 2019-09-26 ENCOUNTER — Ambulatory Visit (HOSPITAL_COMMUNITY): Payer: PPO | Attending: Family Medicine | Admitting: Physical Therapy

## 2019-09-26 ENCOUNTER — Encounter (HOSPITAL_COMMUNITY): Payer: Self-pay | Admitting: Physical Therapy

## 2019-09-26 DIAGNOSIS — M6281 Muscle weakness (generalized): Secondary | ICD-10-CM | POA: Insufficient documentation

## 2019-09-26 DIAGNOSIS — R2689 Other abnormalities of gait and mobility: Secondary | ICD-10-CM | POA: Diagnosis not present

## 2019-09-26 NOTE — Therapy (Signed)
[place], [place], [address] Phone: [phone]   Fax:  [phone]  Physical Therapy Treatment/ Progress Note  Patient Details  Name: [name] [name] MRN: [id number] Date of Birth: 23-Jun-1955 Referring Provider (PT): Roxan Hockey MD   Encounter Date: 09/26/2019   Progress Note Reporting Period 08/21/19 to 09/26/19  See note below for Objective Data and Assessment of Progress/Goals.       PT End of Session - 09/26/19 1138    Visit Number  12    Number of Visits  20    Date for PT Re-Evaluation  10/25/19    Authorization Type  [employer] (no auth, no visit limit)    Authorization Time Period  07/10/19-08/23/19; 08/21/19-09/20/19; 09/26/19-10/25/19    Authorization - Visit Number  12    Authorization - Number of Visits  20    Progress Note Due on Visit  20    PT Start Time  1120    PT Stop Time  1200    PT Time Calculation (min)  40 min    Equipment Utilized During Treatment  Gait belt   RW   Activity Tolerance  Patient tolerated treatment well    Behavior During Therapy  WFL for tasks assessed/performed       Past Medical History:  Diagnosis Date  . Anemia   . Anxiety   . Arthritis   . Asthma   . Atrial fibrillation ([place])   . Cirrhosis of liver ([place])   . COPD (chronic obstructive pulmonary disease) ([place])   . Coronary artery disease   . Depression   . Essential hypertension   . GERD (gastroesophageal reflux disease)   . Hypothyroidism   . Morbid obesity ([place])   . Overactive bladder   . Sleep apnea    CPAP - not consistent  . Thyroid cancer ([place])   . Type 2 diabetes mellitus ([place])     Past Surgical History:  Procedure Laterality Date  . "pump bumps"     bilateral heels--2000  . ABDOMINAL HYSTERECTOMY  1985  . CARDIAC CATHETERIZATION  2012   Dr. Lia Foyer told her nothing was wrong.  [name] [place]  . CHOLECYSTECTOMY  1996  . COLONOSCOPY WITH PROPOFOL N/A 04/17/2015   Procedure: COLONOSCOPY WITH PROPOFOL  at cecum at 0810; withdrawal time =15 minutes;  Surgeon: [name], MD;  [facility];  Service: Endoscopy;  Laterality: N/A;  . Debridement of abdominal wound  2011  . ESOPHAGEAL DILATION N/A 04/17/2015   Procedure: ESOPHAGEAL DILATION WITH 56FR MALONEY DILATOR;  Surgeon: [name], MD;  [facility];  Service: Endoscopy;  Laterality: N/A;  . ESOPHAGOGASTRODUODENOSCOPY (EGD) WITH PROPOFOL N/A 04/17/2015   Procedure: ESOPHAGOGASTRODUODENOSCOPY (EGD) WITH PROPOFOL;  Surgeon: [name], MD;  [facility]: [facility];  Service: Endoscopy;  Laterality: N/A;  . EXPLORATORY LAPAROTOMY  2003  . FOOT SURGERY Bilateral 2000   Bone spur removed  . FOOT SURGERY    . INCISIONAL HERNIA REPAIR N/A 06/09/2014   Procedure: RECURRENT  INCISIONAL HERNIORRHAPHY WITH MESH;  Surgeon: [name], MD;  Location: [facility];  Service: General;  Laterality: N/A;  . INCISIONAL HERNIA REPAIR N/A 10/15/2014   Procedure: [place];  Surgeon: [name], MD;  Location: [facility];  Service: General;  Laterality: N/A;  . INSERTION OF MESH N/A 06/09/2014   Procedure: INSERTION OF MESH;  Surgeon: [name], MD;  [facility]: [facility];  Service: General;  Laterality: N/A;  . INSERTION OF MESH N/A 10/15/2014   Procedure: INSERTION OF MESH;  Surgeon: [name], MD;  Location: [facility];  Service: General;  Laterality: N/A;  . POLYPECTOMY  04/17/2015   Procedure: POLYPECTOMY;  Surgeon: [name], MD;  [facility];  Service: Endoscopy;;  . RESECTION DISTAL CLAVICAL  05/07/2012   Procedure: RESECTION DISTAL CLAVICAL;  Surgeon: [name], MD;  [facility];  Service: Orthopedics;  Laterality: Right;  . SHOULDER OPEN ROTATOR CUFF REPAIR  05/07/2012   Procedure: ROTATOR CUFF REPAIR SHOULDER OPEN;  Surgeon: [name], MD;  [facility];  Service: Orthopedics;  Laterality: Right;  . TEE WITHOUT CARDIOVERSION N/A  12/26/2018   Procedure: TRANSESOPHAGEAL ECHOCARDIOGRAM (TEE) WITH PROPOFOL;  Surgeon: Arnoldo Lenis, MD;  [facility];  Service: Endoscopy;  Laterality: N/A;  . THYROIDECTOMY  2009  . TONSILLECTOMY  1958  . UMBILICAL HERNIA REPAIR  2010    There were no vitals filed for this visit.  Subjective Assessment - 09/26/19 1136    Subjective  Patient presents to physical therapy following 2 week absence. Patient say she stayed in her house because her knees were hurting so bad. Patient says she did not answer her phone because it was broken and she had to get a new one. Patient says she feels that therapy is helping, but she still has knee pain and issues with her balance which continue to limit her. Patient says she had follow up with MD recently and was recommended to continue therapy. Patient says she was told she will eventually need a knee replacement, but says she wants to continue therapy to avoid this as long as she can.    Limitations  Lifting;Standing;Walking;House hold activities    How long can you stand comfortably?  8 minutes    How long can you walk comfortably?  8 minutes with RW    Patient Stated Goals  get back where I can feel better and do some stuff    Currently in Pain?  Yes    Pain Score  7     Pain Location  Knee    Pain Orientation  Right    Pain Descriptors / Indicators  Aching    Pain Type  Chronic pain    Pain Onset  More than a month ago    Pain Frequency  Constant    Aggravating Factors   weight bearing, walking    Pain Relieving Factors  rest, meds    Effect of Pain on Daily Activities  limiting    Multiple Pain Sites  Yes    Pain Score  4    Pain Location  Knee    Pain Orientation  Left    Pain Descriptors / Indicators  Aching    Pain Type  Chronic pain    Pain Onset  More than a month ago    Pain Frequency  Constant         [facility] PT Assessment - 09/26/19 0001      Assessment   Medical Diagnosis  Generalized Weakness    Referring  Provider (PT)  Roxan Hockey MD    Onset Date/Surgical Date  06/20/19    Prior Therapy  Yes      Precautions   Precautions  Fall      Restrictions   Weight Bearing Restrictions  No      Balance Screen   Has the patient fallen in  the past 6 months  Yes   No falls since last progress report   Has the patient had a decrease in activity level because of a fear of falling?   Yes    Is the patient reluctant to leave their home because of a fear of falling?   Yes      [place]  Private residence    Living Arrangements  Alone    Available Help at Discharge  Family;Friend(s);Other (Comment)    Type of [place] entrance    Home Layout  One level    Additional Comments  Has several people to assist with cleaning home, and community ADLs, cousing, Tour manager, friend       Prior Function   Level of Independence  Independent with household mobility with device    Vocation  Retired      Associate Professor   Overall Cognitive Status  Within Functional Limits for tasks assessed      Posture/Postural Control   Posture/Postural Control  Postural limitations    Postural Limitations  Flexed trunk    Posture Comments  requires frequent cueing for uprigth posture and not looking at feet with standing and balance activity       Strength   Overall Strength Comments  tested in seated     Right Hip Flexion  4/5    Right Hip Extension  4-/5    Right Hip ABduction  4+/5    Left Hip Flexion  4/5    Left Hip Extension  4+/5    Left Hip ABduction  4+/5    Right Knee Flexion  4+/5    Right Knee Extension  4+/5    Left Knee Flexion  4+/5    Left Knee Extension  5/5    Right Ankle Dorsiflexion  4+/5    Left Ankle Dorsiflexion  4+/5      Ambulation/Gait   Ambulation/Gait  Yes    Ambulation/Gait Assistance  6: Modified independent (Device/Increase time)    Ambulation Distance (Feet)  155 Feet    Assistive device  Small based quad cane    Gait  Pattern  Decreased stride length;Trunk flexed;Decreased step length - right;Decreased step length - left    Ambulation Surface  Level;Indoor    Gait Comments  2MWT      Static Standing Balance   Static Standing Balance -  Activities   Tandam Stance - Right Leg;Tandam Stance - Left Leg    Static Standing - Comment/# of Minutes  30 sec; 25 sec (staggered stance)                   [facility] Adult PT Treatment/Exercise - 09/26/19 0001      Lumbar Exercises: Aerobic   Nustep  5 min lv 3 warmup for mobility and endurance              PT Education - 09/26/19 1137    Education Details  On reassessent findings, POC and complaince with therapy visits    Person(s) Educated  Patient    Methods  Explanation    Comprehension  Verbalized understanding       PT Short Term Goals - 07/31/19 1100      PT SHORT TERM GOAL #1   Title  Patient will be independent with initial HEP to improve functional outcomes    Time  3    Period  Weeks    Status  Achieved    Target Date  08/02/19        PT Long Term Goals - 09/26/19 1226      PT LONG TERM GOAL #1   Title  Patient will be able to perform stand x 5 in < 15 seconds to demonstrate improvement in functional mobility and reduced risk for falls.    Baseline  Current: 14.1 sec with no UE    Time  6    Period  Weeks    Status  Achieved      PT LONG TERM GOAL #2   Title  Patient will be able to maintain tandem stance >30 seconds on BLEs to improve stability and reduce risk for falls    Baseline  Current: 30 sec RT; 25 sec LT in staggered stance    Time  6    Period  Weeks    Status  On-going      PT LONG TERM GOAL #3   Title  Patient will be able to ambulate at least 275 feet during 2MWT with LRAD to demonstrate improved ability to perform functional mobility and associated tasks.    Baseline  Current: 155 feet with small base quad cane    Time  6    Period  Weeks    Status  On-going      PT LONG TERM GOAL #4   Title   Patient will report at least 60% overall improvement in subjective complaint to indicate improvement in ability to perform ADLs.    Baseline  Current: 65-75%    Time  6    Period  Weeks    Status  Achieved            Plan - 09/26/19 1223    Clinical Impression Statement  Patient has maintained progress fairly well despite absence from therapy for the past 2-3 weeks. Patient shows somewhat improved static balance which has allowed her to progress to gait training with quad cane and less use of her walker. Patient still with decreased gait speed, and remaining balance and mild strength deficits which continue to negatively impact functional ability and place her at higher risk for future falls. At this point, patient will continue to benefit from skilled therapy services to address remaining deficits to reduce pain, improve level of function with ADLs, functional mobility tasks, and reduce risk for future falls. Spoke with patient on importance of compliance with therapy visits, as she has only attended 12 visits in approx. last 3 months. Educated patient that 2 x week compliance is needed to meet remaining goals. Patient verbalized understanding and agrees with this plan.    Personal Factors and Comorbidities  Age;Comorbidity 3+;Fitness    Comorbidities  DM, COPD, HTN    Examination-Activity Limitations  Continence;Stairs;Locomotion Level;Squat;Lift    Examination-Participation Restrictions  Cleaning;Community Activity    Stability/Clinical Decision Making  Stable/Uncomplicated    Rehab Potential  Good    PT Frequency  2x / week    PT Duration  4 weeks    PT Treatment/Interventions  ADLs/Self Care Home Management;Aquatic Therapy;Cryotherapy;Electrical Stimulation;Moist Heat;Gait training;Stair training;Functional mobility training;DME Instruction;Therapeutic activities;Therapeutic exercise;Balance training;Neuromuscular re-education;Patient/family education;Manual techniques;Joint  Manipulations;Passive range of motion;Energy conservation;Splinting;Taping    PT Next Visit Plan  Progress BLE strength, gait, balance, posture, and endurance. Add challenge to dynamic balance as able. Continue gait training with quad cane    PT Home Exercise Plan  07/10/19: seated marching, LAQ, heel/ toe raises seated; 1/27:  diapharagmic breathing, scapular retraction, abdominal  isometric( round the clock), self manual to decrease abdominal lymphatic volume, sit to stand.    Consulted and Agree with Plan of Care  Patient       Patient will benefit from skilled therapeutic intervention in order to improve the following deficits and impairments:  Abnormal gait, Decreased balance, Decreased mobility, Decreased skin integrity, Difficulty walking, Obesity, Decreased strength, Decreased activity tolerance, Decreased safety awareness, Decreased knowledge of use of DME, Increased edema, Improper body mechanics  Visit Diagnosis: Muscle weakness (generalized)  Other abnormalities of gait and mobility     Problem List Patient Active Problem List   Diagnosis Date Noted  . Acute lower UTI 06/22/2019  . Hypokalemia 06/20/2019  . AKI (acute kidney injury) ([place]) 06/01/2019  . NASH (nonalcoholic steatohepatitis) 06/01/2019  . Pressure injury of skin 05/31/2019  . Candidiasis, intertrigo 05/31/2019  . Yeast infection 05/31/2019  . Fever 12/24/2018  . Hyponatremia 12/24/2018  . Abnormal liver function 12/24/2018  . Sepsis due to undetermined organism ([place]) 11/07/2018  . Atrial fibrillation ([place]) 11/07/2018  . COPD (chronic obstructive pulmonary disease) ([place]) 11/07/2018  . GERD (gastroesophageal reflux disease) 11/07/2018  . Hypothyroidism 11/07/2018  . Sleep apnea 11/07/2018  . Hepatic cirrhosis ([place]) 09/01/2014  . Cirrhosis, nonalcoholic ([place]) 15/94/5859  . Incisional hernia 06/09/2014  . S/P complete repair of rotator cuff 07/24/2012  . Precordial pain 09/30/2010  . Essential hypertension,  benign 09/30/2010  . Type 2 diabetes mellitus ([place]) 09/05/2008  . OBESITY-MORBID (>100') 09/05/2008  . ESOPHAGEAL REFLUX 09/05/2008  . EDEMA 09/05/2008   12:28 PM, 09/26/19 Josue Hector PT DPT  Physical Therapist with [facility]  [phone] [place] [address] [place], [place], [address] Phone: [phone]   Fax:  [phone]  Name: [name] [name] MRN: [id number] Date of Birth: Feb 06, 1956

## 2019-10-01 ENCOUNTER — Encounter (HOSPITAL_COMMUNITY): Payer: PPO | Admitting: Physical Therapy

## 2019-10-02 ENCOUNTER — Other Ambulatory Visit: Payer: Self-pay

## 2019-10-02 ENCOUNTER — Ambulatory Visit (HOSPITAL_COMMUNITY): Payer: PPO | Admitting: Physical Therapy

## 2019-10-02 DIAGNOSIS — M6281 Muscle weakness (generalized): Secondary | ICD-10-CM | POA: Diagnosis not present

## 2019-10-02 DIAGNOSIS — R2689 Other abnormalities of gait and mobility: Secondary | ICD-10-CM

## 2019-10-02 NOTE — Therapy (Signed)
[facility] [place], [place], [address] Phone: [phone]   Fax:  [phone]  Physical Therapy Treatment  Patient Details  Name: [name] [name] MRN: [id number] Date of Birth: 1956/01/24 Referring Provider (PT): Roxan Hockey MD   Encounter Date: 10/02/2019  PT End of Session - 10/02/19 1224    Visit Number  13    Number of Visits  20    Date for PT Re-Evaluation  10/25/19    Authorization Type  [employer] (no auth, no visit limit)    Authorization Time Period  07/10/19-08/23/19; 08/21/19-09/20/19; 09/26/19-10/25/19    Authorization - Visit Number  13    Authorization - Number of Visits  20    Progress Note Due on Visit  20    PT Start Time  1130    PT Stop Time  1212    PT Time Calculation (min)  42 min    Equipment Utilized During Treatment  Gait belt   RW   Activity Tolerance  Patient tolerated treatment well    Behavior During Therapy  WFL for tasks assessed/performed       Past Medical History:  Diagnosis Date  . Anemia   . Anxiety   . Arthritis   . Asthma   . Atrial fibrillation ([place])   . Cirrhosis of liver ([place])   . COPD (chronic obstructive pulmonary disease) ([place])   . Coronary artery disease   . Depression   . Essential hypertension   . GERD (gastroesophageal reflux disease)   . Hypothyroidism   . Morbid obesity ([place])   . Overactive bladder   . Sleep apnea    CPAP - not consistent  . Thyroid cancer ([place])   . Type 2 diabetes mellitus ([place])     Past Surgical History:  Procedure Laterality Date  . "pump bumps"     bilateral heels--2000  . ABDOMINAL HYSTERECTOMY  1985  . CARDIAC CATHETERIZATION  2012   Dr. Lia Foyer told her nothing was wrong.  [name] [place]  . CHOLECYSTECTOMY  1996  . COLONOSCOPY WITH PROPOFOL N/A 04/17/2015   Procedure: COLONOSCOPY WITH PROPOFOL  at cecum at 0810; withdrawal time =15 minutes;  Surgeon: [name], MD;  [facility];  Service: Endoscopy;   Laterality: N/A;  . Debridement of abdominal wound  2011  . ESOPHAGEAL DILATION N/A 04/17/2015   Procedure: ESOPHAGEAL DILATION WITH 56FR MALONEY DILATOR;  Surgeon: [name], MD;  [facility];  Service: Endoscopy;  Laterality: N/A;  . ESOPHAGOGASTRODUODENOSCOPY (EGD) WITH PROPOFOL N/A 04/17/2015   Procedure: ESOPHAGOGASTRODUODENOSCOPY (EGD) WITH PROPOFOL;  Surgeon: [name], MD;  [facility];  Service: Endoscopy;  Laterality: N/A;  . EXPLORATORY LAPAROTOMY  2003  . FOOT SURGERY Bilateral 2000   Bone spur removed  . FOOT SURGERY    . INCISIONAL HERNIA REPAIR N/A 06/09/2014   Procedure: RECURRENT  INCISIONAL HERNIORRHAPHY WITH MESH;  Surgeon: [name], MD;  Location: [facility];  Service: General;  Laterality: N/A;  . INCISIONAL HERNIA REPAIR N/A 10/15/2014   Procedure: [place];  Surgeon: [name], MD;  Location: [facility];  Service: General;  Laterality: N/A;  . INSERTION OF MESH N/A 06/09/2014   Procedure: INSERTION OF MESH;  Surgeon: [name], MD;  [facility];  Service: General;  Laterality: N/A;  . INSERTION OF MESH N/A 10/15/2014   Procedure: INSERTION OF MESH;  Surgeon: [name], MD;  Location: [facility];  Service: General;  Laterality: N/A;  . POLYPECTOMY  04/17/2015   Procedure: POLYPECTOMY;  Surgeon: [name], MD;  [facility];  Service: Endoscopy;;  . RESECTION DISTAL CLAVICAL  05/07/2012   Procedure: RESECTION DISTAL CLAVICAL;  Surgeon: [name], MD;  [facility];  Service: Orthopedics;  Laterality: Right;  . SHOULDER OPEN ROTATOR CUFF REPAIR  05/07/2012   Procedure: ROTATOR CUFF REPAIR SHOULDER OPEN;  Surgeon: [name], MD;  [facility];  Service: Orthopedics;  Laterality: Right;  . TEE WITHOUT CARDIOVERSION N/A 12/26/2018   Procedure: TRANSESOPHAGEAL ECHOCARDIOGRAM (TEE) WITH PROPOFOL;  Surgeon: Arnoldo Lenis, MD;  [facility];  Service: Endoscopy;   Laterality: N/A;  . THYROIDECTOMY  2009  . TONSILLECTOMY  1958  . UMBILICAL HERNIA REPAIR  2010    There were no vitals filed for this visit.  Subjective Assessment - 10/02/19 1136    Subjective  pt comes today using QC.  States both her knees hurt but her RT is worse (Rt 8/10, Lt 3/10).    Currently in Pain?  Yes    Pain Score  8     Pain Location  Knee    Pain Orientation  Right    Pain Descriptors / Indicators  Aching                       [facility] Adult PT Treatment/Exercise - 10/02/19 0001      Ambulation/Gait   Ambulation/Gait  Yes    Ambulation/Gait Assistance  6: Modified independent (Device/Increase time)    Ambulation Distance (Feet)  100 Feet    Assistive device  Large base quad cane    Gait Pattern  Decreased stride length;Trunk flexed;Decreased step length - right;Decreased step length - left   furniture grabbing   Ambulation Surface  Level      Lumbar Exercises: Aerobic   Nustep  5 min lv 3 warmup for mobility and endurance       Knee/Hip Exercises: Standing   Heel Raises  20 reps    Heel Raises Limitations  toe raises x20 reps    Knee Flexion  15 reps    Knee Flexion Limitations  marching    Forward Lunges  Both;10 reps    Forward Lunges Limitations  4in step    Hip Abduction  Both;10 reps    Hip Extension  Both;10 reps    Lateral Step Up  Both;10 reps;Hand Hold: 1;Step Height: 4"    Forward Step Up  10 reps;Left;Hand Hold: 1;Step Height: 4"      Knee/Hip Exercises: Seated   Long Arc Quad  Both;2 sets;15 reps               PT Short Term Goals - 07/31/19 1100      PT SHORT TERM GOAL #1   Title  Patient will be independent with initial HEP to improve functional outcomes    Time  3    Period  Weeks    Status  Achieved    Target Date  08/02/19        PT Long Term Goals - 09/26/19 1226      PT LONG TERM GOAL #1   Title  Patient will be able to perform stand x 5 in < 15 seconds to demonstrate improvement in functional  mobility and reduced risk for falls.    Baseline  Current: 14.1 sec with no UE    Time  6    Period  Weeks    Status  Achieved      PT LONG TERM GOAL #2   Title  Patient will be able to maintain tandem stance >30 seconds on BLEs to improve stability and reduce risk for falls    Baseline  Current: 30 sec RT; 25 sec LT in staggered stance    Time  6    Period  Weeks    Status  On-going      PT LONG TERM GOAL #3   Title  Patient will be able to ambulate at least 275 feet during 2MWT with LRAD to demonstrate improved ability to perform functional mobility and associated tasks.    Baseline  Current: 155 feet with small base quad cane    Time  6    Period  Weeks    Status  On-going      PT LONG TERM GOAL #4   Title  Patient will report at least 60% overall improvement in subjective complaint to indicate improvement in ability to perform ADLs.    Baseline  Current: 65-75%    Time  6    Period  Weeks    Status  Achieved            Plan - 10/02/19 1220    Clinical Impression Statement  Continued with LE strengthening and improving funtional tolerance and enduraance.  Pt required multiple rest breaks (between every 2 activites) due to fatigue.  Pt was able to complete all exercises this session without any increase or c/o pain in knees. Adjusted QC appropriatetly that she brought from home and worked on using it safely.  Pt tends to reach for furniture when using QC, as she does this in her home and it is "habbit".  May be safer in the community at this point just using her walker until her strrength and stabiliy improve.    Personal Factors and Comorbidities  Age;Comorbidity 3+;Fitness    Comorbidities  DM, COPD, HTN    Examination-Activity Limitations  Continence;Stairs;Locomotion Level;Squat;Lift    Examination-Participation Restrictions  Cleaning;Community Activity    Stability/Clinical Decision Making  Stable/Uncomplicated    Rehab Potential  Good    PT Frequency  2x / week     PT Duration  4 weeks    PT Treatment/Interventions  ADLs/Self Care Home Management;Aquatic Therapy;Cryotherapy;Electrical Stimulation;Moist Heat;Gait training;Stair training;Functional mobility training;DME Instruction;Therapeutic activities;Therapeutic exercise;Balance training;Neuromuscular re-education;Patient/family education;Manual techniques;Joint Manipulations;Passive range of motion;Energy conservation;Splinting;Taping    PT Next Visit Plan  Progress BLE strength, gait, balance, posture, and endurance. Add challenge to dynamic balance as able. Continue gait training with quad cane    PT Home Exercise Plan  07/10/19: seated marching, LAQ, heel/ toe raises seated; 1/27:  diapharagmic breathing, scapular retraction, abdominal isometric( round the clock), self manual to decrease abdominal lymphatic volume, sit to stand.    Consulted and Agree with Plan of Care  Patient       Patient will benefit from skilled therapeutic intervention in order to improve the following deficits and impairments:  Abnormal gait, Decreased balance, Decreased mobility, Decreased skin integrity, Difficulty walking, Obesity, Decreased strength, Decreased activity tolerance, Decreased safety awareness, Decreased knowledge of use of DME, Increased edema, Improper body mechanics  Visit Diagnosis: Muscle weakness (generalized)  Other abnormalities of gait and mobility     Problem List Patient Active Problem List   Diagnosis Date Noted  . Acute lower UTI 06/22/2019  . Hypokalemia 06/20/2019  . AKI (acute kidney injury) ([place]) 06/01/2019  .  NASH (nonalcoholic steatohepatitis) 06/01/2019  . Pressure injury of skin 05/31/2019  . Candidiasis, intertrigo 05/31/2019  . Yeast infection 05/31/2019  . Fever 12/24/2018  . Hyponatremia 12/24/2018  . Abnormal liver function 12/24/2018  . Sepsis due to undetermined organism ([place]) 11/07/2018  . Atrial fibrillation ([place]) 11/07/2018  . COPD (chronic obstructive pulmonary  disease) ([place]) 11/07/2018  . GERD (gastroesophageal reflux disease) 11/07/2018  . Hypothyroidism 11/07/2018  . Sleep apnea 11/07/2018  . Hepatic cirrhosis ([place]) 09/01/2014  . Cirrhosis, nonalcoholic ([place]) 59/47/0761  . Incisional hernia 06/09/2014  . S/P complete repair of rotator cuff 07/24/2012  . Precordial pain 09/30/2010  . Essential hypertension, benign 09/30/2010  . Type 2 diabetes mellitus ([place]) 09/05/2008  . OBESITY-MORBID (>100') 09/05/2008  . ESOPHAGEAL REFLUX 09/05/2008  . EDEMA 09/05/2008   Teena Irani, PTA/CLT [phone]  Teena Irani 10/02/2019, 12:25 PM  [facility] [place], [place], [address] Phone: [phone]   Fax:  [phone]  Name: [name] [name] MRN: [id number] Date of Birth: 1955/12/10

## 2019-10-03 ENCOUNTER — Ambulatory Visit (HOSPITAL_COMMUNITY): Payer: PPO | Admitting: Physical Therapy

## 2019-10-03 ENCOUNTER — Encounter (HOSPITAL_COMMUNITY): Payer: PPO

## 2019-10-03 DIAGNOSIS — M6281 Muscle weakness (generalized): Secondary | ICD-10-CM | POA: Diagnosis not present

## 2019-10-03 DIAGNOSIS — R2689 Other abnormalities of gait and mobility: Secondary | ICD-10-CM

## 2019-10-03 NOTE — Therapy (Signed)
[place], [place], [address] Phone: [phone]   Fax:  [phone]  Physical Therapy Treatment  Patient Details  Name: [name] [name] MRN: [id number] Date of Birth: [birth year] Referring Provider (PT): Roxan Hockey MD   Encounter Date: 10/03/2019  PT End of Session - 10/03/19 1149    Visit Number  14    Number of Visits  20    Date for PT Re-Evaluation  10/25/19    Authorization Type  [employer] (no auth, no visit limit)    Authorization Time Period  07/10/19-08/23/19; 08/21/19-09/20/19; 09/26/19-10/25/19    Authorization - Visit Number  71    Authorization - Number of Visits  20    Progress Note Due on Visit  20    PT Start Time  1005    PT Stop Time  1045    PT Time Calculation (min)  40 min    Equipment Utilized During Treatment  Gait belt   RW   Activity Tolerance  Patient tolerated treatment well    Behavior During Therapy  WFL for tasks assessed/performed       Past Medical History:  Diagnosis Date  . Anemia   . Anxiety   . Arthritis   . Asthma   . Atrial fibrillation ([place])   . Cirrhosis of liver ([place])   . COPD (chronic obstructive pulmonary disease) ([place])   . Coronary artery disease   . Depression   . Essential hypertension   . GERD (gastroesophageal reflux disease)   . Hypothyroidism   . Morbid obesity ([place])   . Overactive bladder   . Sleep apnea    CPAP - not consistent  . Thyroid cancer ([place])   . Type 2 diabetes mellitus ([place])     Past Surgical History:  Procedure Laterality Date  . "pump bumps"     bilateral heels--2000  . ABDOMINAL HYSTERECTOMY  1985  . CARDIAC CATHETERIZATION  2012   Dr. Lia Foyer told her nothing was wrong.  [name] [place]  . CHOLECYSTECTOMY  1996  . COLONOSCOPY WITH PROPOFOL N/A 04/17/2015   Procedure: COLONOSCOPY WITH PROPOFOL  at cecum at 0810; withdrawal time =15 minutes;  Surgeon: [name], MD;  [facility];  Service: Endoscopy;   Laterality: N/A;  . Debridement of abdominal wound  2011  . ESOPHAGEAL DILATION N/A 04/17/2015   Procedure: ESOPHAGEAL DILATION WITH 56FR MALONEY DILATOR;  Surgeon: [name], MD;  Location: [facility];  Service: Endoscopy;  Laterality: N/A;  . ESOPHAGOGASTRODUODENOSCOPY (EGD) WITH PROPOFOL N/A 04/17/2015   Procedure: ESOPHAGOGASTRODUODENOSCOPY (EGD) WITH PROPOFOL;  Surgeon: [name], MD;  Location: [facility];  Service: Endoscopy;  Laterality: N/A;  . EXPLORATORY LAPAROTOMY  2003  . FOOT SURGERY Bilateral 2000   Bone spur removed  . FOOT SURGERY    . INCISIONAL HERNIA REPAIR N/A 06/09/2014   Procedure: RECURRENT  INCISIONAL HERNIORRHAPHY WITH MESH;  Surgeon: [name], MD;  Location: [facility];  Service: General;  Laterality: N/A;  . INCISIONAL HERNIA REPAIR N/A 10/15/2014   Procedure: [place];  Surgeon: [name], MD;  Location: [facility];  Service: General;  Laterality: N/A;  . INSERTION OF MESH N/A 06/09/2014   Procedure: INSERTION OF MESH;  Surgeon: [name], MD;  [facility];  Service: General;  Laterality: N/A;  . INSERTION OF MESH N/A 10/15/2014   Procedure: INSERTION OF MESH;  Surgeon: [name], MD;  Location: [facility]  OR;  Service: General;  Laterality: N/A;  . POLYPECTOMY  04/17/2015   Procedure: POLYPECTOMY;  Surgeon: [name], MD;  [facility]: [facility];  Service: Endoscopy;;  . RESECTION DISTAL CLAVICAL  05/07/2012   Procedure: RESECTION DISTAL CLAVICAL;  Surgeon: [name], MD;  [facility]: [facility];  Service: Orthopedics;  Laterality: Right;  . SHOULDER OPEN ROTATOR CUFF REPAIR  05/07/2012   Procedure: ROTATOR CUFF REPAIR SHOULDER OPEN;  Surgeon: [name], MD;  [facility];  Service: Orthopedics;  Laterality: Right;  . TEE WITHOUT CARDIOVERSION N/A 12/26/2018   Procedure: TRANSESOPHAGEAL ECHOCARDIOGRAM (TEE) WITH PROPOFOL;  Surgeon: Arnoldo Lenis, MD;  [facility];  Service: Endoscopy;   Laterality: N/A;  . THYROIDECTOMY  2009  . TONSILLECTOMY  1958  . UMBILICAL HERNIA REPAIR  2010    There were no vitals filed for this visit.                    [place] Adult PT Treatment/Exercise - 10/03/19 0001      Lumbar Exercises: Aerobic   Nustep  5 min lv 3 warmup for mobility and endurance       Knee/Hip Exercises: Standing   Heel Raises  20 reps    Heel Raises Limitations  toe raises x20 reps    Forward Lunges  Both;15 reps    Forward Lunges Limitations  4in step    Hip Abduction  Both;15 reps    Hip Extension  Both;15 reps    Lateral Step Up  Both;Hand Hold: 1;Step Height: 4";15 reps    Forward Step Up  Left;Hand Hold: 1;Step Height: 4";15 reps               PT Short Term Goals - 07/31/19 1100      PT SHORT TERM GOAL #1   Title  Patient will be independent with initial HEP to improve functional outcomes    Time  3    Period  Weeks    Status  Achieved    Target Date  08/02/19        PT Long Term Goals - 09/26/19 1226      PT LONG TERM GOAL #1   Title  Patient will be able to perform stand x 5 in < 15 seconds to demonstrate improvement in functional mobility and reduced risk for falls.    Baseline  Current: 14.1 sec with no UE    Time  6    Period  Weeks    Status  Achieved      PT LONG TERM GOAL #2   Title  Patient will be able to maintain tandem stance >30 seconds on BLEs to improve stability and reduce risk for falls    Baseline  Current: 30 sec RT; 25 sec LT in staggered stance    Time  6    Period  Weeks    Status  On-going      PT LONG TERM GOAL #3   Title  Patient will be able to ambulate at least 275 feet during 2MWT with LRAD to demonstrate improved ability to perform functional mobility and associated tasks.    Baseline  Current: 155 feet with small base quad cane    Time  6    Period  Weeks    Status  On-going      PT LONG TERM GOAL #4   Title  Patient will report at least 60% overall improvement in subjective  complaint  to indicate improvement in ability to perform ADLs.    Baseline  Current: 65-75%    Time  6    Period  Weeks    Status  Achieved              Patient will benefit from skilled therapeutic intervention in order to improve the following deficits and impairments:     Visit Diagnosis: Muscle weakness (generalized)  Other abnormalities of gait and mobility     Problem List Patient Active Problem List   Diagnosis Date Noted  . Acute lower UTI 06/22/2019  . Hypokalemia 06/20/2019  . AKI (acute kidney injury) ([place]) 06/01/2019  . NASH (nonalcoholic steatohepatitis) 06/01/2019  . Pressure injury of skin 05/31/2019  . Candidiasis, intertrigo 05/31/2019  . Yeast infection 05/31/2019  . Fever 12/24/2018  . Hyponatremia 12/24/2018  . Abnormal liver function 12/24/2018  . Sepsis due to undetermined organism ([place]) 11/07/2018  . Atrial fibrillation ([place]) 11/07/2018  . COPD (chronic obstructive pulmonary disease) ([place]) 11/07/2018  . GERD (gastroesophageal reflux disease) 11/07/2018  . Hypothyroidism 11/07/2018  . Sleep apnea 11/07/2018  . Hepatic cirrhosis ([place]) 09/01/2014  . Cirrhosis, nonalcoholic ([place]) 87/57/9728  . Incisional hernia 06/09/2014  . S/P complete repair of rotator cuff 07/24/2012  . Precordial pain 09/30/2010  . Essential hypertension, benign 09/30/2010  . Type 2 diabetes mellitus ([place]) 09/05/2008  . OBESITY-MORBID (>100') 09/05/2008  . ESOPHAGEAL REFLUX 09/05/2008  . EDEMA 09/05/2008   Teena Irani, PTA/CLT [phone]  Teena Irani 10/03/2019, 11:50 AM  [place], [place], [address] Phone: [phone]   Fax:  [phone]  Name: [name] [name] MRN: [id number] Date of Birth: 01/18/1956

## 2019-10-04 DIAGNOSIS — E1140 Type 2 diabetes mellitus with diabetic neuropathy, unspecified: Secondary | ICD-10-CM | POA: Diagnosis not present

## 2019-10-07 ENCOUNTER — Telehealth: Payer: Self-pay

## 2019-10-07 ENCOUNTER — Ambulatory Visit (HOSPITAL_COMMUNITY): Payer: PPO | Admitting: Physical Therapy

## 2019-10-07 ENCOUNTER — Other Ambulatory Visit: Payer: Self-pay

## 2019-10-07 ENCOUNTER — Encounter (HOSPITAL_COMMUNITY): Payer: Self-pay | Admitting: Physical Therapy

## 2019-10-07 DIAGNOSIS — M6281 Muscle weakness (generalized): Secondary | ICD-10-CM | POA: Diagnosis not present

## 2019-10-07 DIAGNOSIS — R2689 Other abnormalities of gait and mobility: Secondary | ICD-10-CM

## 2019-10-07 NOTE — Telephone Encounter (Signed)
Pt calling back to give vitals as requested.  Please call [phone]   Thanks renee

## 2019-10-07 NOTE — Therapy (Signed)
[place], [place], [address] Phone: [phone]   Fax:  [phone]  Physical Therapy Treatment  Patient Details  Name: LEKEYA ROLLINGS MRN: [id number] Date of Birth: July 18, 1955 Referring Provider (PT): Roxan Hockey MD   Encounter Date: 10/07/2019  PT End of Session - 10/07/19 1359    Visit Number  15    Number of Visits  20    Date for PT Re-Evaluation  10/25/19    Authorization Type  [employer] (no auth, no visit limit)    Authorization Time Period  07/10/19-08/23/19; 08/21/19-09/20/19; 09/26/19-10/25/19    Authorization - Visit Number  15    Authorization - Number of Visits  20    Progress Note Due on Visit  20    PT Start Time  1350    PT Stop Time  1430    PT Time Calculation (min)  40 min    Equipment Utilized During Treatment  --   RW   Activity Tolerance  Patient tolerated treatment well;Patient limited by pain    Behavior During Therapy  [facility] for tasks assessed/performed       Past Medical History:  Diagnosis Date  . Anemia   . Anxiety   . Arthritis   . Asthma   . Atrial fibrillation ([place])   . Cirrhosis of liver ([place])   . COPD (chronic obstructive pulmonary disease) ([place])   . Coronary artery disease   . Depression   . Essential hypertension   . GERD (gastroesophageal reflux disease)   . Hypothyroidism   . Morbid obesity ([place])   . Overactive bladder   . Sleep apnea    CPAP - not consistent  . Thyroid cancer ([place])   . Type 2 diabetes mellitus ([place])     Past Surgical History:  Procedure Laterality Date  . "pump bumps"     bilateral heels--2000  . ABDOMINAL HYSTERECTOMY  1985  . CARDIAC CATHETERIZATION  2012   Dr. Lia Foyer told her nothing was wrong.  [name] [place]  . CHOLECYSTECTOMY  1996  . COLONOSCOPY WITH PROPOFOL N/A 04/17/2015   Procedure: COLONOSCOPY WITH PROPOFOL  at cecum at 0810; withdrawal time =15 minutes;  Surgeon: [name], MD;  [facility];   Service: Endoscopy;  Laterality: N/A;  . Debridement of abdominal wound  2011  . ESOPHAGEAL DILATION N/A 04/17/2015   Procedure: ESOPHAGEAL DILATION WITH 56FR MALONEY DILATOR;  Surgeon: [name], MD;  [facility];  Service: Endoscopy;  Laterality: N/A;  . ESOPHAGOGASTRODUODENOSCOPY (EGD) WITH PROPOFOL N/A 04/17/2015   Procedure: ESOPHAGOGASTRODUODENOSCOPY (EGD) WITH PROPOFOL;  Surgeon: [name], MD;  [facility];  Service: Endoscopy;  Laterality: N/A;  . EXPLORATORY LAPAROTOMY  2003  . FOOT SURGERY Bilateral 2000   Bone spur removed  . FOOT SURGERY    . INCISIONAL HERNIA REPAIR N/A 06/09/2014   Procedure: RECURRENT  INCISIONAL HERNIORRHAPHY WITH MESH;  Surgeon: [name], MD;  Location: [facility];  Service: General;  Laterality: N/A;  . INCISIONAL HERNIA REPAIR N/A 10/15/2014   Procedure: [place];  Surgeon: [name], MD;  Location: [facility];  Service: General;  Laterality: N/A;  . INSERTION OF MESH N/A 06/09/2014   Procedure: INSERTION OF MESH;  Surgeon: [name], MD;  [facility];  Service: General;  Laterality: N/A;  . INSERTION OF MESH N/A 10/15/2014   Procedure: INSERTION OF MESH;  Surgeon: [name], MD;  Location: [facility];  Service: General;  Laterality: N/A;  . POLYPECTOMY  04/17/2015   Procedure: POLYPECTOMY;  Surgeon: [name], MD;  Location: [facility];  Service: Endoscopy;;  . RESECTION DISTAL CLAVICAL  05/07/2012   Procedure: RESECTION DISTAL CLAVICAL;  Surgeon: [name], MD;  Location: [facility];  Service: Orthopedics;  Laterality: Right;  . SHOULDER OPEN ROTATOR CUFF REPAIR  05/07/2012   Procedure: ROTATOR CUFF REPAIR SHOULDER OPEN;  Surgeon: [name], MD;  [facility];  Service: Orthopedics;  Laterality: Right;  . TEE WITHOUT CARDIOVERSION N/A 12/26/2018   Procedure: TRANSESOPHAGEAL ECHOCARDIOGRAM (TEE) WITH PROPOFOL;  Surgeon: Arnoldo Lenis, MD;  [facility];   Service: Endoscopy;  Laterality: N/A;  . THYROIDECTOMY  2009  . TONSILLECTOMY  1958  . UMBILICAL HERNIA REPAIR  2010    There were no vitals filed for this visit.  Subjective Assessment - 10/07/19 1357    Subjective  Patient says her RT knee and feet have been bothering her. Patient says she feels like stabbing in her feet. Says RT knee has been sore since yesterday walking around [place] because there were no scooters available.    Currently in Pain?  Yes    Pain Score  9     Pain Location  Knee    Pain Orientation  Right    Pain Descriptors / Indicators  Aching;Stabbing    Pain Type  Chronic pain    Pain Onset  More than a month ago    Pain Frequency  Constant                       [facility] Adult PT Treatment/Exercise - 10/07/19 0001      Lumbar Exercises: Aerobic   Nustep  5 min lv 3 warmup for mobility and endurance       Lumbar Exercises: Seated   Long Arc Quad on Chair  Both;20 reps;Weights    LAQ on Chair Weights (lbs)  3#      Knee/Hip Exercises: Clinical research associate  Both;3 reps;30 seconds    Gastroc Stretch Limitations  slant board       Knee/Hip Exercises: Standing   Heel Raises  20 reps    Hip Abduction  Both;15 reps    Abduction Limitations  RTB    Hip Extension  Both;15 reps    Extension Limitations  RTB    Forward Step Up  Hand Hold: 2;Step Height: 6";10 reps;Both   only 5 reps on RLE per increased knee pain   Gait Training  tandem stance 30" each BLE    Other Standing Knee Exercises  NBOS on foam alternating eyes open/ closed 3 x 30"      Knee/Hip Exercises: Seated   Sit to Sand  10 reps;with UE support               PT Short Term Goals - 07/31/19 1100      PT SHORT TERM GOAL #1   Title  Patient will be independent with initial HEP to improve functional outcomes    Time  3    Period  Weeks    Status  Achieved    Target Date  08/02/19        PT Long Term Goals - 09/26/19 1226      PT LONG TERM GOAL #1   Title   Patient will be able to perform stand x 5 in < 15 seconds  to demonstrate improvement in functional mobility and reduced risk for falls.    Baseline  Current: 14.1 sec with no UE    Time  6    Period  Weeks    Status  Achieved      PT LONG TERM GOAL #2   Title  Patient will be able to maintain tandem stance >30 seconds on BLEs to improve stability and reduce risk for falls    Baseline  Current: 30 sec RT; 25 sec LT in staggered stance    Time  6    Period  Weeks    Status  On-going      PT LONG TERM GOAL #3   Title  Patient will be able to ambulate at least 275 feet during 2MWT with LRAD to demonstrate improved ability to perform functional mobility and associated tasks.    Baseline  Current: 155 feet with small base quad cane    Time  6    Period  Weeks    Status  On-going      PT LONG TERM GOAL #4   Title  Patient will report at least 60% overall improvement in subjective complaint to indicate improvement in ability to perform ADLs.    Baseline  Current: 65-75%    Time  6    Period  Weeks    Status  Achieved            Plan - 10/07/19 1431    Clinical Impression Statement  Patient tolerated session well overall today. Patient was unable to complete step ups on RLE due to increased knee pain. Added static balance progression to challenge patient balance with eyes closed. Patient did well with this but did require intermittent HHA. Added ankle weights to seated LAQs for improving bilateral quad stretch. Patient noted increased difficulty/ fatigue on RLE, but no pain. Patient continues to be limited by RLE weakness which is impacting functional mobility and balance, also requires frequent cueing for upright posture with standing exercise.    Personal Factors and Comorbidities  Age;Comorbidity 3+;Fitness    Comorbidities  DM, COPD, HTN    Examination-Activity Limitations  Continence;Stairs;Locomotion Level;Squat;Lift    Examination-Participation Restrictions  Cleaning;Community  Activity    Stability/Clinical Decision Making  Stable/Uncomplicated    Rehab Potential  Good    PT Frequency  2x / week    PT Duration  4 weeks    PT Treatment/Interventions  ADLs/Self Care Home Management;Aquatic Therapy;Cryotherapy;Electrical Stimulation;Moist Heat;Gait training;Stair training;Functional mobility training;DME Instruction;Therapeutic activities;Therapeutic exercise;Balance training;Neuromuscular re-education;Patient/family education;Manual techniques;Joint Manipulations;Passive range of motion;Energy conservation;Splinting;Taping    PT Next Visit Plan  Progress BLE strength, gait, balance, posture, and endurance. Add challenge to dynamic balance as able. Continue gait training with quad cane    PT Home Exercise Plan  07/10/19: seated marching, LAQ, heel/ toe raises seated; 1/27:  diapharagmic breathing, scapular retraction, abdominal isometric( round the clock), self manual to decrease abdominal lymphatic volume, sit to stand.    Consulted and Agree with Plan of Care  Patient       Patient will benefit from skilled therapeutic intervention in order to improve the following deficits and impairments:  Abnormal gait, Decreased balance, Decreased mobility, Decreased skin integrity, Difficulty walking, Obesity, Decreased strength, Decreased activity tolerance, Decreased safety awareness, Decreased knowledge of use of DME, Increased edema, Improper body mechanics  Visit Diagnosis: Muscle weakness (generalized)  Other abnormalities of gait and mobility     Problem List Patient Active Problem List   Diagnosis Date Noted  .  Acute lower UTI 06/22/2019  . Hypokalemia 06/20/2019  . AKI (acute kidney injury) ([place]) 06/01/2019  . NASH (nonalcoholic steatohepatitis) 06/01/2019  . Pressure injury of skin 05/31/2019  . Candidiasis, intertrigo 05/31/2019  . Yeast infection 05/31/2019  . Fever 12/24/2018  . Hyponatremia 12/24/2018  . Abnormal liver function 12/24/2018  . Sepsis due  to undetermined organism ([place]) 11/07/2018  . Atrial fibrillation ([place]) 11/07/2018  . COPD (chronic obstructive pulmonary disease) ([place]) 11/07/2018  . GERD (gastroesophageal reflux disease) 11/07/2018  . Hypothyroidism 11/07/2018  . Sleep apnea 11/07/2018  . Hepatic cirrhosis ([place]) 09/01/2014  . Cirrhosis, nonalcoholic ([place]) 04/79/9872  . Incisional hernia 06/09/2014  . S/P complete repair of rotator cuff 07/24/2012  . Precordial pain 09/30/2010  . Essential hypertension, benign 09/30/2010  . Type 2 diabetes mellitus ([place]) 09/05/2008  . OBESITY-MORBID (>100') 09/05/2008  . ESOPHAGEAL REFLUX 09/05/2008  . EDEMA 09/05/2008   2:33 PM, 10/07/19 Josue Hector PT DPT  Physical Therapist with [facility]  [phone] [place] [address] [place], [place], [address] Phone: [phone]   Fax:  [phone]  Name: SHAREEKA YIM MRN: [id number] Date of Birth: [birth year]

## 2019-10-08 DIAGNOSIS — E1140 Type 2 diabetes mellitus with diabetic neuropathy, unspecified: Secondary | ICD-10-CM | POA: Diagnosis not present

## 2019-10-08 DIAGNOSIS — G629 Polyneuropathy, unspecified: Secondary | ICD-10-CM | POA: Diagnosis not present

## 2019-10-08 DIAGNOSIS — E669 Obesity, unspecified: Secondary | ICD-10-CM | POA: Diagnosis not present

## 2019-10-08 DIAGNOSIS — G894 Chronic pain syndrome: Secondary | ICD-10-CM | POA: Diagnosis not present

## 2019-10-08 NOTE — Telephone Encounter (Signed)
Thank you for the update.  Is that intentional weight loss through diet?  Just wanted to make sure.

## 2019-10-08 NOTE — Telephone Encounter (Signed)
Her weight loss is due to diet.

## 2019-10-08 NOTE — Telephone Encounter (Signed)
Pt called to inform Dr. Domenic Polite of her updated weight loss. She is down 40 lbs, her bp today was 142/84  Hr  88. o2 98  Temp. 97.3  She was advised her A1C is 5.8 Sending as an [place]

## 2019-10-09 ENCOUNTER — Ambulatory Visit (HOSPITAL_COMMUNITY): Payer: PPO | Admitting: Physical Therapy

## 2019-10-09 ENCOUNTER — Other Ambulatory Visit: Payer: Self-pay

## 2019-10-09 ENCOUNTER — Encounter (HOSPITAL_COMMUNITY): Payer: Self-pay | Admitting: Physical Therapy

## 2019-10-09 DIAGNOSIS — R2689 Other abnormalities of gait and mobility: Secondary | ICD-10-CM

## 2019-10-09 DIAGNOSIS — M6281 Muscle weakness (generalized): Secondary | ICD-10-CM

## 2019-10-09 NOTE — Therapy (Signed)
[address], [place], [address] Phone: [phone]   Fax:  [phone]  Physical Therapy Treatment  Patient Details  Name: [name] [name] MRN: [id number] Date of Birth: 05-Jul-1955 Referring Provider (PT): Roxan Hockey MD   Encounter Date: 10/09/2019  PT End of Session - 10/09/19 1324    Visit Number  16    Number of Visits  20    Date for PT Re-Evaluation  10/25/19    Authorization Type  [employer] (no auth, no visit limit)    Authorization Time Period  07/10/19-08/23/19; 08/21/19-09/20/19; 09/26/19-10/25/19    Authorization - Visit Number  38    Authorization - Number of Visits  20    Progress Note Due on Visit  20    PT Start Time  1300    PT Stop Time  1338    PT Time Calculation (min)  38 min    Equipment Utilized During Treatment  --   RW   Activity Tolerance  Patient tolerated treatment well;Patient limited by pain    Behavior During Therapy  [facility] for tasks assessed/performed       Past Medical History:  Diagnosis Date  . Anemia   . Anxiety   . Arthritis   . Asthma   . Atrial fibrillation ([place])   . Cirrhosis of liver ([place])   . COPD (chronic obstructive pulmonary disease) ([place])   . Coronary artery disease   . Depression   . Essential hypertension   . GERD (gastroesophageal reflux disease)   . Hypothyroidism   . Morbid obesity ([place])   . Overactive bladder   . Sleep apnea    CPAP - not consistent  . Thyroid cancer ([place])   . Type 2 diabetes mellitus ([place])     Past Surgical History:  Procedure Laterality Date  . "pump bumps"     bilateral heels--2000  . ABDOMINAL HYSTERECTOMY  1985  . CARDIAC CATHETERIZATION  2012   Dr. Lia Foyer told her nothing was wrong.  [name] [place]  . CHOLECYSTECTOMY  1996  . COLONOSCOPY WITH PROPOFOL N/A 04/17/2015   Procedure: COLONOSCOPY WITH PROPOFOL  at cecum at 0810; withdrawal time =15 minutes;  Surgeon: [name], MD;  [facility];   Service: Endoscopy;  Laterality: N/A;  . Debridement of abdominal wound  2011  . ESOPHAGEAL DILATION N/A 04/17/2015   Procedure: ESOPHAGEAL DILATION WITH 56FR MALONEY DILATOR;  Surgeon: [name], MD;  [facility]: [facility];  Service: Endoscopy;  Laterality: N/A;  . ESOPHAGOGASTRODUODENOSCOPY (EGD) WITH PROPOFOL N/A 04/17/2015   Procedure: ESOPHAGOGASTRODUODENOSCOPY (EGD) WITH PROPOFOL;  Surgeon: [name], MD;  [facility]: [facility];  Service: Endoscopy;  Laterality: N/A;  . EXPLORATORY LAPAROTOMY  2003  . FOOT SURGERY Bilateral 2000   Bone spur removed  . FOOT SURGERY    . INCISIONAL HERNIA REPAIR N/A 06/09/2014   Procedure: RECURRENT  INCISIONAL HERNIORRHAPHY WITH MESH;  Surgeon: [name], MD;  Location: [facility];  Service: General;  Laterality: N/A;  . INCISIONAL HERNIA REPAIR N/A 10/15/2014   Procedure: [place];  Surgeon: [name], MD;  Location: [place];  Service: General;  Laterality: N/A;  . INSERTION OF MESH N/A 06/09/2014   Procedure: INSERTION OF MESH;  Surgeon: [name], MD;  [facility];  Service: General;  Laterality: N/A;  . INSERTION OF MESH N/A 10/15/2014   Procedure: INSERTION OF MESH;  Surgeon: [name], MD;  Location: [place] [facility];  Service: General;  Laterality: N/A;  . POLYPECTOMY  04/17/2015   Procedure: POLYPECTOMY;  Surgeon: [name], MD;  [facility];  Service: Endoscopy;;  . RESECTION DISTAL CLAVICAL  05/07/2012   Procedure: RESECTION DISTAL CLAVICAL;  Surgeon: [name], MD;  [facility];  Service: Orthopedics;  Laterality: Right;  . SHOULDER OPEN ROTATOR CUFF REPAIR  05/07/2012   Procedure: ROTATOR CUFF REPAIR SHOULDER OPEN;  Surgeon: [name], MD;  [facility];  Service: Orthopedics;  Laterality: Right;  . TEE WITHOUT CARDIOVERSION N/A 12/26/2018   Procedure: TRANSESOPHAGEAL ECHOCARDIOGRAM (TEE) WITH PROPOFOL;  Surgeon: Arnoldo Lenis, MD;  [facility];   Service: Endoscopy;  Laterality: N/A;  . THYROIDECTOMY  2009  . TONSILLECTOMY  1958  . UMBILICAL HERNIA REPAIR  2010    There were no vitals filed for this visit.  Subjective Assessment - 10/09/19 1304    Subjective  Patient reported RT knee pain currently which she stated is nothing unusual. She reported that her MD increased her gabapentin. Reported that her biggest issue currently is getting up and down.    Currently in Pain?  Yes    Pain Score  8     Pain Location  Knee    Pain Orientation  Right    Pain Descriptors / Indicators  Aching    Pain Onset  More than a month ago                       [facility] Adult PT Treatment/Exercise - 10/09/19 0001      Lumbar Exercises: Seated   Long Arc Quad on Chair  Both;20 reps;Weights    LAQ on Chair Weights (lbs)  3#    Sit to Stand  20 reps    Sit to Stand Limitations  2x10 from elevated mat      Knee/Hip Exercises: Stretches   Gastroc Stretch  Both;3 reps;30 seconds    Gastroc Stretch Limitations  slant board       Knee/Hip Exercises: Standing   Heel Raises  20 reps    Hip Abduction  Both;15 reps    Abduction Limitations  RTB    Hip Extension  Both;15 reps    Extension Limitations  RTB    Gait Training  tandem stance 30" each BLE      Manual Therapy   Manual Therapy  Joint mobilization    Manual therapy comments  all manual completed separately from other skilled interventions    Joint Mobilization  Patellar glides grade II for pain relief all directions               PT Short Term Goals - 07/31/19 1100      PT SHORT TERM GOAL #1   Title  Patient will be independent with initial HEP to improve functional outcomes    Time  3    Period  Weeks    Status  Achieved    Target Date  08/02/19        PT Long Term Goals - 09/26/19 1226      PT LONG TERM GOAL #1   Title  Patient will be able to perform stand x 5 in < 15 seconds to demonstrate improvement in functional mobility and reduced risk for  falls.    Baseline  Current: 14.1 sec with no UE    Time  6    Period  Weeks  Status  Achieved      PT LONG TERM GOAL #2   Title  Patient will be able to maintain tandem stance >30 seconds on BLEs to improve stability and reduce risk for falls    Baseline  Current: 30 sec RT; 25 sec LT in staggered stance    Time  6    Period  Weeks    Status  On-going      PT LONG TERM GOAL #3   Title  Patient will be able to ambulate at least 275 feet during 2MWT with LRAD to demonstrate improved ability to perform functional mobility and associated tasks.    Baseline  Current: 155 feet with small base quad cane    Time  6    Period  Weeks    Status  On-going      PT LONG TERM GOAL #4   Title  Patient will report at least 60% overall improvement in subjective complaint to indicate improvement in ability to perform ADLs.    Baseline  Current: 65-75%    Time  6    Period  Weeks    Status  Achieved            Plan - 10/09/19 1449    Clinical Impression Statement  Patient reporting 8/10 pain in her right knee at the start of session. Performed low-grade patellar glides for pain relief to perform functional strengthening more easily. Patient reported some improvement in pain following this. Focused on sit to stands with proper form and without upper extremity assistance as patient reported getting up and down at home is still challenging at times. Patient continues to require cueing to improve upright posture with exercises.    Personal Factors and Comorbidities  Age;Comorbidity 3+;Fitness    Comorbidities  DM, COPD, HTN    Examination-Activity Limitations  Continence;Stairs;Locomotion Level;Squat;Lift    Examination-Participation Restrictions  Cleaning;Community Activity    Stability/Clinical Decision Making  Stable/Uncomplicated    Rehab Potential  Good    PT Frequency  2x / week    PT Duration  4 weeks    PT Treatment/Interventions  ADLs/Self Care Home Management;Aquatic  Therapy;Cryotherapy;Electrical Stimulation;Moist Heat;Gait training;Stair training;Functional mobility training;DME Instruction;Therapeutic activities;Therapeutic exercise;Balance training;Neuromuscular re-education;Patient/family education;Manual techniques;Joint Manipulations;Passive range of motion;Energy conservation;Splinting;Taping    PT Next Visit Plan  Progress BLE strength, gait, balance, posture, and endurance. Add challenge to dynamic balance as able. Continue gait training with quad cane    PT Home Exercise Plan  07/10/19: seated marching, LAQ, heel/ toe raises seated; 1/27:  diapharagmic breathing, scapular retraction, abdominal isometric( round the clock), self manual to decrease abdominal lymphatic volume, sit to stand.    Consulted and Agree with Plan of Care  Patient       Patient will benefit from skilled therapeutic intervention in order to improve the following deficits and impairments:  Abnormal gait, Decreased balance, Decreased mobility, Decreased skin integrity, Difficulty walking, Obesity, Decreased strength, Decreased activity tolerance, Decreased safety awareness, Decreased knowledge of use of DME, Increased edema, Improper body mechanics  Visit Diagnosis: Muscle weakness (generalized)  Other abnormalities of gait and mobility     Problem List Patient Active Problem List   Diagnosis Date Noted  . Acute lower UTI 06/22/2019  . Hypokalemia 06/20/2019  . AKI (acute kidney injury) ([place]) 06/01/2019  . NASH (nonalcoholic steatohepatitis) 06/01/2019  . Pressure injury of skin 05/31/2019  . Candidiasis, intertrigo 05/31/2019  . Yeast infection 05/31/2019  . Fever 12/24/2018  . Hyponatremia 12/24/2018  . Abnormal  liver function 12/24/2018  . Sepsis due to undetermined organism ([place]) 11/07/2018  . Atrial fibrillation ([place]) 11/07/2018  . COPD (chronic obstructive pulmonary disease) ([place]) 11/07/2018  . GERD (gastroesophageal reflux disease) 11/07/2018  . Hypothyroidism  11/07/2018  . Sleep apnea 11/07/2018  . Hepatic cirrhosis ([place]) 09/01/2014  . Cirrhosis, nonalcoholic ([place]) 93/23/5573  . Incisional hernia 06/09/2014  . S/P complete repair of rotator cuff 07/24/2012  . Precordial pain 09/30/2010  . Essential hypertension, benign 09/30/2010  . Type 2 diabetes mellitus ([place]) 09/05/2008  . OBESITY-MORBID (>100') 09/05/2008  . ESOPHAGEAL REFLUX 09/05/2008  . EDEMA 09/05/2008   Clarene Critchley PT, DPT 2:55 PM, 10/09/19 [place] [address] [place], [place], [address] Phone: [phone]   Fax:  [phone]  Name: [name] [name] MRN: [id number] Date of Birth: Sep 16, 1955

## 2019-10-14 ENCOUNTER — Encounter (HOSPITAL_COMMUNITY): Payer: Self-pay | Admitting: Physical Therapy

## 2019-10-14 ENCOUNTER — Ambulatory Visit (HOSPITAL_COMMUNITY): Payer: PPO | Admitting: Physical Therapy

## 2019-10-14 ENCOUNTER — Other Ambulatory Visit: Payer: Self-pay

## 2019-10-14 DIAGNOSIS — R2689 Other abnormalities of gait and mobility: Secondary | ICD-10-CM

## 2019-10-14 DIAGNOSIS — M6281 Muscle weakness (generalized): Secondary | ICD-10-CM | POA: Diagnosis not present

## 2019-10-14 NOTE — Therapy (Signed)
[place], [place], [address] Phone: [phone]   Fax:  [phone]  Physical Therapy Treatment  Patient Details  Name: [name] [name] MRN: [id number] Date of Birth: Jan 31, 1956 Referring Provider (PT): Roxan Hockey MD   Encounter Date: 10/14/2019  PT End of Session - 10/14/19 1350    Visit Number  17    Number of Visits  20    Date for PT Re-Evaluation  10/25/19    Authorization Type  [employer] (no auth, no visit limit)    Authorization - Visit Number  17    Authorization - Number of Visits  20    Progress Note Due on Visit  20    PT Start Time  1345    PT Stop Time  1430    PT Time Calculation (min)  45 min    Equipment Utilized During Treatment  Gait belt   RW   Activity Tolerance  Patient tolerated treatment well;Patient limited by pain;Patient limited by fatigue    Behavior During Therapy  [facility] for tasks assessed/performed       Past Medical History:  Diagnosis Date  . Anemia   . Anxiety   . Arthritis   . Asthma   . Atrial fibrillation ([place])   . Cirrhosis of liver ([place])   . COPD (chronic obstructive pulmonary disease) ([place])   . Coronary artery disease   . Depression   . Essential hypertension   . GERD (gastroesophageal reflux disease)   . Hypothyroidism   . Morbid obesity ([place])   . Overactive bladder   . Sleep apnea    CPAP - not consistent  . Thyroid cancer ([place])   . Type 2 diabetes mellitus ([place])     Past Surgical History:  Procedure Laterality Date  . "pump bumps"     bilateral heels--2000  . ABDOMINAL HYSTERECTOMY  1985  . CARDIAC CATHETERIZATION  2012   Dr. Lia Foyer told her nothing was wrong.  [name] [place]  . CHOLECYSTECTOMY  1996  . COLONOSCOPY WITH PROPOFOL N/A 04/17/2015   Procedure: COLONOSCOPY WITH PROPOFOL  at cecum at 0810; withdrawal time =15 minutes;  Surgeon: [name], MD;  [facility];  Service: Endoscopy;  Laterality: N/A;  .  Debridement of abdominal wound  2011  . ESOPHAGEAL DILATION N/A 04/17/2015   Procedure: ESOPHAGEAL DILATION WITH 56FR MALONEY DILATOR;  Surgeon: [name], MD;  [facility];  Service: Endoscopy;  Laterality: N/A;  . ESOPHAGOGASTRODUODENOSCOPY (EGD) WITH PROPOFOL N/A 04/17/2015   Procedure: ESOPHAGOGASTRODUODENOSCOPY (EGD) WITH PROPOFOL;  Surgeon: [name], MD;  [facility];  Service: Endoscopy;  Laterality: N/A;  . EXPLORATORY LAPAROTOMY  2003  . FOOT SURGERY Bilateral 2000   Bone spur removed  . FOOT SURGERY    . INCISIONAL HERNIA REPAIR N/A 06/09/2014   Procedure: RECURRENT  INCISIONAL HERNIORRHAPHY WITH MESH;  Surgeon: [name], MD;  [facility]: [facility];  Service: General;  Laterality: N/A;  . INCISIONAL HERNIA REPAIR N/A 10/15/2014   Procedure: [place];  Surgeon: [name], MD;  Location: [place];  Service: General;  Laterality: N/A;  . INSERTION OF MESH N/A 06/09/2014   Procedure: INSERTION OF MESH;  Surgeon: [name], MD;  [facility];  Service: General;  Laterality: N/A;  . INSERTION OF MESH N/A 10/15/2014   Procedure: INSERTION OF MESH;  Surgeon: [name], MD;  Location: [place];  Service: General;  Laterality: N/A;  . POLYPECTOMY  04/17/2015   Procedure: POLYPECTOMY;  Surgeon: [name], MD;  Location: [facility];  Service: Endoscopy;;  . RESECTION DISTAL CLAVICAL  05/07/2012   Procedure: RESECTION DISTAL CLAVICAL;  Surgeon: [name], MD;  Location: [facility];  Service: Orthopedics;  Laterality: Right;  . SHOULDER OPEN ROTATOR CUFF REPAIR  05/07/2012   Procedure: ROTATOR CUFF REPAIR SHOULDER OPEN;  Surgeon: [name], MD;  [facility]: [facility];  Service: Orthopedics;  Laterality: Right;  . TEE WITHOUT CARDIOVERSION N/A 12/26/2018   Procedure: TRANSESOPHAGEAL ECHOCARDIOGRAM (TEE) WITH PROPOFOL;  Surgeon: Arnoldo Lenis, MD;  [facility];  Service: Endoscopy;  Laterality: N/A;  .  THYROIDECTOMY  2009  . TONSILLECTOMY  1958  . UMBILICAL HERNIA REPAIR  2010    There were no vitals filed for this visit.  Subjective Assessment - 10/14/19 1349    Subjective  Patient says her RT knee had done pretty well up until today. Says she was able to do some walking since last time, but had to use her walker because she doesn't feel safe with her cane yet.    Limitations  Lifting;Standing;Walking;House hold activities    Patient Stated Goals  get back where I can feel better and do some stuff    Currently in Pain?  Yes    Pain Score  7     Pain Location  Knee    Pain Orientation  Right    Pain Descriptors / Indicators  Aching    Pain Type  Chronic pain    Pain Onset  More than a month ago    Pain Frequency  Constant    Aggravating Factors   weight bearing, walking, standing    Pain Relieving Factors  rest, meds    Effect of Pain on Daily Activities  limits                       [facility] Adult PT Treatment/Exercise - 10/14/19 0001      Lumbar Exercises: Aerobic   Nustep  4 min lv 3 warmup for mobility and endurance       Knee/Hip Exercises: Stretches   Gastroc Stretch  Both;3 reps;30 seconds    Gastroc Stretch Limitations  slant board       Knee/Hip Exercises: Standing   Heel Raises  20 reps    Hip Abduction  Both;20 reps    Abduction Limitations  RTB    Hip Extension  Both;20 reps    Extension Limitations  RTB    Forward Step Up  Hand Hold: 2;Step Height: 6";10 reps;Both    Gait Training  226 feet with small quad cane, cues for posture and looking straight ahead    Other Standing Knee Exercises  NBOS on foam alternating eyes open/ closed 2 x 30"; NBOS on foam with head turns x10    Other Standing Knee Exercises  staggered standing on foam 2 x 30" each; step over 4 inch hurdles x 2RT      Knee/Hip Exercises: Seated   Long Arc Quad  Both;2 sets;10 reps;Weights    Long Arc Quad Weight  3 lbs.    Sit to Sand  10 reps;without UE support                PT Short Term Goals - 07/31/19 1100      PT SHORT TERM GOAL #1   Title  Patient will be independent with initial  HEP to improve functional outcomes    Time  3    Period  Weeks    Status  Achieved    Target Date  08/02/19        PT Long Term Goals - 09/26/19 1226      PT LONG TERM GOAL #1   Title  Patient will be able to perform stand x 5 in < 15 seconds to demonstrate improvement in functional mobility and reduced risk for falls.    Baseline  Current: 14.1 sec with no UE    Time  6    Period  Weeks    Status  Achieved      PT LONG TERM GOAL #2   Title  Patient will be able to maintain tandem stance >30 seconds on BLEs to improve stability and reduce risk for falls    Baseline  Current: 30 sec RT; 25 sec LT in staggered stance    Time  6    Period  Weeks    Status  On-going      PT LONG TERM GOAL #3   Title  Patient will be able to ambulate at least 275 feet during 2MWT with LRAD to demonstrate improved ability to perform functional mobility and associated tasks.    Baseline  Current: 155 feet with small base quad cane    Time  6    Period  Weeks    Status  On-going      PT LONG TERM GOAL #4   Title  Patient will report at least 60% overall improvement in subjective complaint to indicate improvement in ability to perform ADLs.    Baseline  Current: 65-75%    Time  6    Period  Weeks    Status  Achieved            Plan - 10/14/19 1452    Clinical Impression Statement  Patient with overall good tolerance to ther ex today. Patient did note mild discomfort in RT knee with step ups but was able to complete all reps with increased use of RUE for support. Patient with good return on added static balance activity on complaint surface. Able to resume gait training with quad cane, patient requires verbal cues for posturing and looking ahead. Patient able to progress to hurdle step overs, but was well challenged. Patient noted increased difficulty due to  RLE weakness, and fear of falling, but performed well with CG assist and use of quad cane in LT hand. Patient does require frequent rest breaks due to fatigue with activity.    Personal Factors and Comorbidities  Age;Comorbidity 3+;Fitness    Comorbidities  DM, COPD, HTN    Examination-Activity Limitations  Continence;Stairs;Locomotion Level;Squat;Lift    Examination-Participation Restrictions  Cleaning;Community Activity    Stability/Clinical Decision Making  Stable/Uncomplicated    Rehab Potential  Good    PT Frequency  2x / week    PT Duration  4 weeks    PT Treatment/Interventions  ADLs/Self Care Home Management;Aquatic Therapy;Cryotherapy;Electrical Stimulation;Moist Heat;Gait training;Stair training;Functional mobility training;DME Instruction;Therapeutic activities;Therapeutic exercise;Balance training;Neuromuscular re-education;Patient/family education;Manual techniques;Joint Manipulations;Passive range of motion;Energy conservation;Splinting;Taping    PT Next Visit Plan  Progress BLE strength, gait, balance, posture, and endurance. Add challenge to dynamic balance as able. Continue gait training with quad cane    PT Home Exercise Plan  07/10/19: seated marching, LAQ, heel/ toe raises seated; 1/27:  diapharagmic breathing, scapular retraction, abdominal isometric( round the clock), self manual to decrease abdominal lymphatic volume, sit  to stand.    Consulted and Agree with Plan of Care  Patient       Patient will benefit from skilled therapeutic intervention in order to improve the following deficits and impairments:  Abnormal gait, Decreased balance, Decreased mobility, Decreased skin integrity, Difficulty walking, Obesity, Decreased strength, Decreased activity tolerance, Decreased safety awareness, Decreased knowledge of use of DME, Increased edema, Improper body mechanics  Visit Diagnosis: Muscle weakness (generalized)  Other abnormalities of gait and mobility     Problem  List Patient Active Problem List   Diagnosis Date Noted  . Acute lower UTI 06/22/2019  . Hypokalemia 06/20/2019  . AKI (acute kidney injury) ([place]) 06/01/2019  . NASH (nonalcoholic steatohepatitis) 06/01/2019  . Pressure injury of skin 05/31/2019  . Candidiasis, intertrigo 05/31/2019  . Yeast infection 05/31/2019  . Fever 12/24/2018  . Hyponatremia 12/24/2018  . Abnormal liver function 12/24/2018  . Sepsis due to undetermined organism ([place]) 11/07/2018  . Atrial fibrillation ([place]) 11/07/2018  . COPD (chronic obstructive pulmonary disease) ([place]) 11/07/2018  . GERD (gastroesophageal reflux disease) 11/07/2018  . Hypothyroidism 11/07/2018  . Sleep apnea 11/07/2018  . Hepatic cirrhosis ([place]) 09/01/2014  . Cirrhosis, nonalcoholic ([place]) 24/02/7352  . Incisional hernia 06/09/2014  . S/P complete repair of rotator cuff 07/24/2012  . Precordial pain 09/30/2010  . Essential hypertension, benign 09/30/2010  . Type 2 diabetes mellitus ([place]) 09/05/2008  . OBESITY-MORBID (>100') 09/05/2008  . ESOPHAGEAL REFLUX 09/05/2008  . EDEMA 09/05/2008    2:55 PM, 10/14/19 Josue Hector PT DPT  Physical Therapist with [facility]  [phone] [place] [address] [place], [place], [address] Phone: [phone]   Fax:  [phone]  Name: [name] [name] MRN: [id number] Date of Birth: [birth year]

## 2019-10-16 ENCOUNTER — Other Ambulatory Visit: Payer: Self-pay

## 2019-10-16 ENCOUNTER — Encounter (HOSPITAL_COMMUNITY): Payer: Self-pay | Admitting: Physical Therapy

## 2019-10-16 ENCOUNTER — Ambulatory Visit (HOSPITAL_COMMUNITY): Payer: PPO | Admitting: Physical Therapy

## 2019-10-16 DIAGNOSIS — R2689 Other abnormalities of gait and mobility: Secondary | ICD-10-CM

## 2019-10-16 DIAGNOSIS — M6281 Muscle weakness (generalized): Secondary | ICD-10-CM

## 2019-10-16 NOTE — Therapy (Signed)
[place], [place], [address] Phone: [phone]   Fax:  [phone]  Physical Therapy Treatment  Patient Details  Name: [name] [name] MRN: [id number] Date of Birth: [birth year] Referring Provider (PT): Roxan Hockey MD   Encounter Date: 10/16/2019  PT End of Session - 10/16/19 1340    Visit Number  18    Number of Visits  20    Date for PT Re-Evaluation  10/25/19    Authorization Type  [employer] (no auth, no visit limit)    Authorization - Visit Number  18    Authorization - Number of Visits  20    Progress Note Due on Visit  20    PT Start Time  1343    PT Stop Time  1430    PT Time Calculation (min)  47 min    Equipment Utilized During Treatment  Gait belt   RW   Activity Tolerance  Patient tolerated treatment well;Patient limited by fatigue    Behavior During Therapy  [facility] for tasks assessed/performed       Past Medical History:  Diagnosis Date  . Anemia   . Anxiety   . Arthritis   . Asthma   . Atrial fibrillation ([place])   . Cirrhosis of liver ([place])   . COPD (chronic obstructive pulmonary disease) ([place])   . Coronary artery disease   . Depression   . Essential hypertension   . GERD (gastroesophageal reflux disease)   . Hypothyroidism   . Morbid obesity ([place])   . Overactive bladder   . Sleep apnea    CPAP - not consistent  . Thyroid cancer ([place])   . Type 2 diabetes mellitus ([place])     Past Surgical History:  Procedure Laterality Date  . "pump bumps"     bilateral heels--2000  . ABDOMINAL HYSTERECTOMY  1985  . CARDIAC CATHETERIZATION  2012   Dr. Lia Foyer told her nothing was wrong.  [name] [place]  . CHOLECYSTECTOMY  1996  . COLONOSCOPY WITH PROPOFOL N/A 04/17/2015   Procedure: COLONOSCOPY WITH PROPOFOL  at cecum at 0810; withdrawal time =15 minutes;  Surgeon: [name], MD;  [facility];  Service: Endoscopy;  Laterality: N/A;  . Debridement of abdominal  wound  2011  . ESOPHAGEAL DILATION N/A 04/17/2015   Procedure: ESOPHAGEAL DILATION WITH 56FR MALONEY DILATOR;  Surgeon: [name], MD;  [facility];  Service: Endoscopy;  Laterality: N/A;  . ESOPHAGOGASTRODUODENOSCOPY (EGD) WITH PROPOFOL N/A 04/17/2015   Procedure: ESOPHAGOGASTRODUODENOSCOPY (EGD) WITH PROPOFOL;  Surgeon: [name], MD;  [facility];  Service: Endoscopy;  Laterality: N/A;  . EXPLORATORY LAPAROTOMY  2003  . FOOT SURGERY Bilateral 2000   Bone spur removed  . FOOT SURGERY    . INCISIONAL HERNIA REPAIR N/A 06/09/2014   Procedure: RECURRENT  INCISIONAL HERNIORRHAPHY WITH MESH;  Surgeon: [name], MD;  Location: [facility];  Service: General;  Laterality: N/A;  . INCISIONAL HERNIA REPAIR N/A 10/15/2014   Procedure: [place];  Surgeon: [name], MD;  Location: [place];  Service: General;  Laterality: N/A;  . INSERTION OF MESH N/A 06/09/2014   Procedure: INSERTION OF MESH;  Surgeon: [name], MD;  [facility];  Service: General;  Laterality: N/A;  . INSERTION OF MESH N/A 10/15/2014   Procedure: INSERTION OF MESH;  Surgeon: [name], MD;  Location: [place];  Service: General;  Laterality: N/A;  .  POLYPECTOMY  04/17/2015   Procedure: POLYPECTOMY;  Surgeon: [name], MD;  [facility];  Service: Endoscopy;;  . RESECTION DISTAL CLAVICAL  05/07/2012   Procedure: RESECTION DISTAL CLAVICAL;  Surgeon: [name], MD;  [facility];  Service: Orthopedics;  Laterality: Right;  . SHOULDER OPEN ROTATOR CUFF REPAIR  05/07/2012   Procedure: ROTATOR CUFF REPAIR SHOULDER OPEN;  Surgeon: [name], MD;  [facility];  Service: Orthopedics;  Laterality: Right;  . TEE WITHOUT CARDIOVERSION N/A 12/26/2018   Procedure: TRANSESOPHAGEAL ECHOCARDIOGRAM (TEE) WITH PROPOFOL;  Surgeon: Arnoldo Lenis, MD;  [facility];  Service: Endoscopy;  Laterality: N/A;  . THYROIDECTOMY  2009  .  TONSILLECTOMY  1958  . UMBILICAL HERNIA REPAIR  2010    There were no vitals filed for this visit.  Subjective Assessment - 10/16/19 1348    Subjective  Patient says she is tired today, didn't sleep well last night. Says she had an "uneasy feeling". Says knee is a little better, about a 6 today    Limitations  Lifting;Standing;Walking;House hold activities    Patient Stated Goals  get back where I can feel better and do some stuff    Currently in Pain?  Yes    Pain Score  6     Pain Location  Knee    Pain Orientation  Right    Pain Descriptors / Indicators  Aching    Pain Type  Chronic pain    Pain Onset  More than a month ago    Aggravating Factors   WB, standing, walking    Pain Relieving Factors  rest, meds    Effect of Pain on Daily Activities  Limits                       [facility] Adult PT Treatment/Exercise - 10/16/19 0001      Lumbar Exercises: Aerobic   Nustep  4 min lv 3 warmup for mobility and endurance       Knee/Hip Exercises: Standing   Heel Raises  20 reps    Knee Flexion  Both;1 set;15 reps    Knee Flexion Limitations  2#    Hip Abduction  Both;1 set;15 reps;Knee straight    Abduction Limitations  2#    Hip Extension  Both;1 set;15 reps;Knee straight    Extension Limitations  2#    Forward Step Up  Hand Hold: 2;Step Height: 6";Both;15 reps    Step Down  Both;1 set;10 reps;Hand Hold: 2;Step Height: 4"    Other Standing Knee Exercises  staggered gait (outsides of blue line) 1RT     Other Standing Knee Exercises  staggered standing on foam 2 x 30" each; step over 4 inch hurdles x 2RT      Knee/Hip Exercises: Seated   Long Arc Quad  Both;1 set;15 reps;Weights    Long Arc Quad Weight  2 lbs.               PT Short Term Goals - 07/31/19 1100      PT SHORT TERM GOAL #1   Title  Patient will be independent with initial HEP to improve functional outcomes    Time  3    Period  Weeks    Status  Achieved    Target Date  08/02/19         PT Long Term Goals - 09/26/19 1226      PT LONG TERM GOAL #1  Title  Patient will be able to perform stand x 5 in < 15 seconds to demonstrate improvement in functional mobility and reduced risk for falls.    Baseline  Current: 14.1 sec with no UE    Time  6    Period  Weeks    Status  Achieved      PT LONG TERM GOAL #2   Title  Patient will be able to maintain tandem stance >30 seconds on BLEs to improve stability and reduce risk for falls    Baseline  Current: 30 sec RT; 25 sec LT in staggered stance    Time  6    Period  Weeks    Status  On-going      PT LONG TERM GOAL #3   Title  Patient will be able to ambulate at least 275 feet during 2MWT with LRAD to demonstrate improved ability to perform functional mobility and associated tasks.    Baseline  Current: 155 feet with small base quad cane    Time  6    Period  Weeks    Status  On-going      PT LONG TERM GOAL #4   Title  Patient will report at least 60% overall improvement in subjective complaint to indicate improvement in ability to perform ADLs.    Baseline  Current: 65-75%    Time  6    Period  Weeks    Status  Achieved            Plan - 10/16/19 1427    Clinical Impression Statement  Patient demos slightly more fatigue today. Patient was more challenged with dynamic balance, staggered gait, requiring HHA x 1. Patient also with more difficulty with hurdle step over due to fatigue and demos RLE circumduction. Patient unable to correct effectively with verbal cues. Patient is improving with static balance, and was able to progress standing LE strengthening exercise today, but with noted fatigue. Patient required several rest breaks during session.    Personal Factors and Comorbidities  Age;Comorbidity 3+;Fitness    Comorbidities  DM, COPD, HTN    Examination-Activity Limitations  Continence;Stairs;Locomotion Level;Squat;Lift    Examination-Participation Restrictions  Cleaning;Community Activity     Stability/Clinical Decision Making  Stable/Uncomplicated    Rehab Potential  Good    PT Frequency  2x / week    PT Duration  4 weeks    PT Treatment/Interventions  ADLs/Self Care Home Management;Aquatic Therapy;Cryotherapy;Electrical Stimulation;Moist Heat;Gait training;Stair training;Functional mobility training;DME Instruction;Therapeutic activities;Therapeutic exercise;Balance training;Neuromuscular re-education;Patient/family education;Manual techniques;Joint Manipulations;Passive range of motion;Energy conservation;Splinting;Taping    PT Next Visit Plan  Progress BLE strength, gait, balance, posture, and endurance. Add challenge to dynamic balance as able. Continue gait training with quad cane and step downs    PT Home Exercise Plan  07/10/19: seated marching, LAQ, heel/ toe raises seated; 1/27:  diapharagmic breathing, scapular retraction, abdominal isometric( round the clock), self manual to decrease abdominal lymphatic volume, sit to stand.    Consulted and Agree with Plan of Care  Patient       Patient will benefit from skilled therapeutic intervention in order to improve the following deficits and impairments:  Abnormal gait, Decreased balance, Decreased mobility, Decreased skin integrity, Difficulty walking, Obesity, Decreased strength, Decreased activity tolerance, Decreased safety awareness, Decreased knowledge of use of DME, Increased edema, Improper body mechanics  Visit Diagnosis: Muscle weakness (generalized)  Other abnormalities of gait and mobility     Problem List Patient Active Problem List   Diagnosis Date  Noted  . Acute lower UTI 06/22/2019  . Hypokalemia 06/20/2019  . AKI (acute kidney injury) ([place]) 06/01/2019  . NASH (nonalcoholic steatohepatitis) 06/01/2019  . Pressure injury of skin 05/31/2019  . Candidiasis, intertrigo 05/31/2019  . Yeast infection 05/31/2019  . Fever 12/24/2018  . Hyponatremia 12/24/2018  . Abnormal liver function 12/24/2018  . Sepsis due  to undetermined organism ([place]) 11/07/2018  . Atrial fibrillation ([place]) 11/07/2018  . COPD (chronic obstructive pulmonary disease) ([place]) 11/07/2018  . GERD (gastroesophageal reflux disease) 11/07/2018  . Hypothyroidism 11/07/2018  . Sleep apnea 11/07/2018  . Hepatic cirrhosis ([place]) 09/01/2014  . Cirrhosis, nonalcoholic ([place]) 88/75/7972  . Incisional hernia 06/09/2014  . S/P complete repair of rotator cuff 07/24/2012  . Precordial pain 09/30/2010  . Essential hypertension, benign 09/30/2010  . Type 2 diabetes mellitus ([place]) 09/05/2008  . OBESITY-MORBID (>100') 09/05/2008  . ESOPHAGEAL REFLUX 09/05/2008  . EDEMA 09/05/2008   2:29 PM, 10/16/19 Josue Hector PT DPT  Physical Therapist with [facility]  [phone] [place] [address] [place], [place], [address] Phone: [phone]   Fax:  [phone]  Name: [name] [name] MRN: [id number] Date of Birth: 12-14-1955

## 2019-10-18 DIAGNOSIS — E063 Autoimmune thyroiditis: Secondary | ICD-10-CM | POA: Diagnosis not present

## 2019-10-18 DIAGNOSIS — M170 Bilateral primary osteoarthritis of knee: Secondary | ICD-10-CM | POA: Diagnosis not present

## 2019-10-18 DIAGNOSIS — I872 Venous insufficiency (chronic) (peripheral): Secondary | ICD-10-CM | POA: Diagnosis not present

## 2019-10-18 DIAGNOSIS — E1140 Type 2 diabetes mellitus with diabetic neuropathy, unspecified: Secondary | ICD-10-CM | POA: Diagnosis not present

## 2019-10-21 ENCOUNTER — Other Ambulatory Visit: Payer: Self-pay

## 2019-10-21 ENCOUNTER — Ambulatory Visit (HOSPITAL_COMMUNITY): Payer: PPO | Attending: Family Medicine | Admitting: Physical Therapy

## 2019-10-21 ENCOUNTER — Encounter (HOSPITAL_COMMUNITY): Payer: Self-pay | Admitting: Physical Therapy

## 2019-10-21 DIAGNOSIS — M6281 Muscle weakness (generalized): Secondary | ICD-10-CM | POA: Diagnosis not present

## 2019-10-21 DIAGNOSIS — R2689 Other abnormalities of gait and mobility: Secondary | ICD-10-CM | POA: Diagnosis not present

## 2019-10-21 NOTE — Therapy (Signed)
[place], [place], [address] Phone: [phone]   Fax:  [phone]  Physical Therapy Treatment  Patient Details  Name: [name] [name] MRN: [id number] Date of Birth: 1955/10/16 Referring Provider (PT): Roxan Hockey MD   Encounter Date: 10/21/2019  PT End of Session - 10/21/19 1351    Visit Number  19    Number of Visits  20    Date for PT Re-Evaluation  10/25/19    Authorization Type  [employer] (no auth, no visit limit)    Authorization - Visit Number  19    Authorization - Number of Visits  20    Progress Note Due on Visit  20    PT Start Time  1345    PT Stop Time  1430    PT Time Calculation (min)  45 min    Equipment Utilized During Treatment  Gait belt   RW   Activity Tolerance  Patient tolerated treatment well;Patient limited by fatigue;Patient limited by pain    Behavior During Therapy  [facility] for tasks assessed/performed       Past Medical History:  Diagnosis Date  . Anemia   . Anxiety   . Arthritis   . Asthma   . Atrial fibrillation ([place])   . Cirrhosis of liver ([place])   . COPD (chronic obstructive pulmonary disease) ([place])   . Coronary artery disease   . Depression   . Essential hypertension   . GERD (gastroesophageal reflux disease)   . Hypothyroidism   . Morbid obesity ([place])   . Overactive bladder   . Sleep apnea    CPAP - not consistent  . Thyroid cancer ([place])   . Type 2 diabetes mellitus ([place])     Past Surgical History:  Procedure Laterality Date  . "pump bumps"     bilateral heels--2000  . ABDOMINAL HYSTERECTOMY  1985  . CARDIAC CATHETERIZATION  2012   Dr. Lia Foyer told her nothing was wrong.  [name] [place]  . CHOLECYSTECTOMY  1996  . COLONOSCOPY WITH PROPOFOL N/A 04/17/2015   Procedure: COLONOSCOPY WITH PROPOFOL  at cecum at 0810; withdrawal time =15 minutes;  Surgeon: [name], MD;  [facility];  Service: Endoscopy;  Laterality: N/A;  .  Debridement of abdominal wound  2011  . ESOPHAGEAL DILATION N/A 04/17/2015   Procedure: ESOPHAGEAL DILATION WITH 56FR MALONEY DILATOR;  Surgeon: [name], MD;  [facility];  Service: Endoscopy;  Laterality: N/A;  . ESOPHAGOGASTRODUODENOSCOPY (EGD) WITH PROPOFOL N/A 04/17/2015   Procedure: ESOPHAGOGASTRODUODENOSCOPY (EGD) WITH PROPOFOL;  Surgeon: [name], MD;  [facility];  Service: Endoscopy;  Laterality: N/A;  . EXPLORATORY LAPAROTOMY  2003  . FOOT SURGERY Bilateral 2000   Bone spur removed  . FOOT SURGERY    . INCISIONAL HERNIA REPAIR N/A 06/09/2014   Procedure: RECURRENT  INCISIONAL HERNIORRHAPHY WITH MESH;  Surgeon: [name], MD;  [facility];  Service: General;  Laterality: N/A;  . INCISIONAL HERNIA REPAIR N/A 10/15/2014   Procedure: [place];  Surgeon: [name], MD;  Location: [place];  Service: General;  Laterality: N/A;  . INSERTION OF MESH N/A 06/09/2014   Procedure: INSERTION OF MESH;  Surgeon: [name], MD;  [facility];  Service: General;  Laterality: N/A;  . INSERTION OF MESH N/A 10/15/2014   Procedure: INSERTION OF MESH;  Surgeon: [name], MD;  Location: [place];  Service: General;  Laterality: N/A;  . POLYPECTOMY  04/17/2015   Procedure: POLYPECTOMY;  Surgeon: [name], MD;  [facility];  Service: Endoscopy;;  . RESECTION DISTAL CLAVICAL  05/07/2012   Procedure: RESECTION DISTAL CLAVICAL;  Surgeon: [name], MD;  [facility];  Service: Orthopedics;  Laterality: Right;  . SHOULDER OPEN ROTATOR CUFF REPAIR  05/07/2012   Procedure: ROTATOR CUFF REPAIR SHOULDER OPEN;  Surgeon: [name], MD;  [facility];  Service: Orthopedics;  Laterality: Right;  . TEE WITHOUT CARDIOVERSION N/A 12/26/2018   Procedure: TRANSESOPHAGEAL ECHOCARDIOGRAM (TEE) WITH PROPOFOL;  Surgeon: Arnoldo Lenis, MD;  [facility];  Service: Endoscopy;  Laterality: N/A;  .  THYROIDECTOMY  2009  . TONSILLECTOMY  1958  . UMBILICAL HERNIA REPAIR  2010    There were no vitals filed for this visit.  Subjective Assessment - 10/21/19 1351    Subjective  Patient reports she is doing ok today, no new issues. Says she was tired after last time, but not too sore. No falls or increased pain since last time. RT knee is "about a 7" today    Limitations  Lifting;Standing;Walking;House hold activities    Patient Stated Goals  get back where I can feel better and do some stuff    Currently in Pain?  Yes    Pain Location  Knee    Pain Orientation  Right    Pain Descriptors / Indicators  Aching    Pain Type  Chronic pain    Pain Onset  More than a month ago                       [facility] Adult PT Treatment/Exercise - 10/21/19 0001      Lumbar Exercises: Aerobic   Nustep  4 min lv 3 warmup for mobility and endurance       Knee/Hip Exercises: Stretches   Gastroc Stretch  Both;3 reps;30 seconds    Gastroc Stretch Limitations  slant board       Knee/Hip Exercises: Standing   Heel Raises  20 reps    Knee Flexion  Both;15 reps;2 sets    Knee Flexion Limitations  2#    Hip Abduction  Both;15 reps;Knee straight;2 sets    Abduction Limitations  2#    Hip Extension  Both;15 reps;Knee straight;2 sets    Extension Limitations  2#    Forward Step Up  Hand Hold: 2;Step Height: 6";Both;10 reps    Other Standing Knee Exercises  staggered standing on solid floor 2 x 30" each      Knee/Hip Exercises: Seated   Long Arc Quad  Both;2 sets;10 reps    Long Arc Quad Weight  2 lbs.               PT Short Term Goals - 07/31/19 1100      PT SHORT TERM GOAL #1   Title  Patient will be independent with initial HEP to improve functional outcomes    Time  3    Period  Weeks    Status  Achieved    Target Date  08/02/19        PT Long Term Goals - 09/26/19 1226      PT LONG TERM GOAL #1   Title  Patient will be able to perform stand x 5 in < 15 seconds to  demonstrate improvement in functional mobility and reduced risk for falls.    Baseline  Current:  14.1 sec with no UE    Time  6    Period  Weeks    Status  Achieved      PT LONG TERM GOAL #2   Title  Patient will be able to maintain tandem stance >30 seconds on BLEs to improve stability and reduce risk for falls    Baseline  Current: 30 sec RT; 25 sec LT in staggered stance    Time  6    Period  Weeks    Status  On-going      PT LONG TERM GOAL #3   Title  Patient will be able to ambulate at least 275 feet during 2MWT with LRAD to demonstrate improved ability to perform functional mobility and associated tasks.    Baseline  Current: 155 feet with small base quad cane    Time  6    Period  Weeks    Status  On-going      PT LONG TERM GOAL #4   Title  Patient will report at least 60% overall improvement in subjective complaint to indicate improvement in ability to perform ADLs.    Baseline  Current: 65-75%    Time  6    Period  Weeks    Status  Achieved            Plan - 10/21/19 1431    Clinical Impression Statement  Patient able to increase reps with standing hip strengthening, but with noted increased fatigue. Patient continues to have significant difficulty and limitation with ascending 6-7 inch step height on RLE. Patient uses significant UE compensation at stairs using both hand rails, due to quad weakness and increased RT knee pain. Patient able to improve slightly with cues for weightlifting and using thigh muscle for knee extension while ascending step, but still with significant difficulty. Patient requires several breaks for rest during session due to increased fatigue with activity. Reassess next visit.    Personal Factors and Comorbidities  Age;Comorbidity 3+;Fitness    Comorbidities  DM, COPD, HTN    Examination-Activity Limitations  Continence;Stairs;Locomotion Level;Squat;Lift    Examination-Participation Restrictions  Cleaning;Community Activity     Stability/Clinical Decision Making  Stable/Uncomplicated    Rehab Potential  Good    PT Frequency  2x / week    PT Duration  4 weeks    PT Treatment/Interventions  ADLs/Self Care Home Management;Aquatic Therapy;Cryotherapy;Electrical Stimulation;Moist Heat;Gait training;Stair training;Functional mobility training;DME Instruction;Therapeutic activities;Therapeutic exercise;Balance training;Neuromuscular re-education;Patient/family education;Manual techniques;Joint Manipulations;Passive range of motion;Energy conservation;Splinting;Taping    PT Next Visit Plan  Reassess next visit, adjust POC as indicated    PT Home Exercise Plan  07/10/19: seated marching, LAQ, heel/ toe raises seated; 1/27:  diapharagmic breathing, scapular retraction, abdominal isometric( round the clock), self manual to decrease abdominal lymphatic volume, sit to stand.    Consulted and Agree with Plan of Care  Patient       Patient will benefit from skilled therapeutic intervention in order to improve the following deficits and impairments:  Abnormal gait, Decreased balance, Decreased mobility, Decreased skin integrity, Difficulty walking, Obesity, Decreased strength, Decreased activity tolerance, Decreased safety awareness, Decreased knowledge of use of DME, Increased edema, Improper body mechanics  Visit Diagnosis: Muscle weakness (generalized)  Other abnormalities of gait and mobility     Problem List Patient Active Problem List   Diagnosis Date Noted  . Acute lower UTI 06/22/2019  . Hypokalemia 06/20/2019  . AKI (acute kidney injury) ([place]) 06/01/2019  . NASH (nonalcoholic steatohepatitis) 06/01/2019  . Pressure  injury of skin 05/31/2019  . Candidiasis, intertrigo 05/31/2019  . Yeast infection 05/31/2019  . Fever 12/24/2018  . Hyponatremia 12/24/2018  . Abnormal liver function 12/24/2018  . Sepsis due to undetermined organism ([place]) 11/07/2018  . Atrial fibrillation ([place]) 11/07/2018  . COPD (chronic  obstructive pulmonary disease) ([place]) 11/07/2018  . GERD (gastroesophageal reflux disease) 11/07/2018  . Hypothyroidism 11/07/2018  . Sleep apnea 11/07/2018  . Hepatic cirrhosis ([place]) 09/01/2014  . Cirrhosis, nonalcoholic ([place]) 71/29/2909  . Incisional hernia 06/09/2014  . S/P complete repair of rotator cuff 07/24/2012  . Precordial pain 09/30/2010  . Essential hypertension, benign 09/30/2010  . Type 2 diabetes mellitus ([place]) 09/05/2008  . OBESITY-MORBID (>100') 09/05/2008  . ESOPHAGEAL REFLUX 09/05/2008  . EDEMA 09/05/2008    2:34 PM, 10/21/19 Josue Hector PT DPT  Physical Therapist with [facility]  [phone] [place] [address] [place], [place], [address] Phone: [phone]   Fax:  [phone]  Name: [name] [name] MRN: [id number] Date of Birth: 10-12-1955

## 2019-10-23 ENCOUNTER — Encounter (HOSPITAL_COMMUNITY): Payer: Self-pay | Admitting: Physical Therapy

## 2019-10-23 ENCOUNTER — Other Ambulatory Visit: Payer: Self-pay

## 2019-10-23 ENCOUNTER — Ambulatory Visit (HOSPITAL_COMMUNITY): Payer: PPO | Admitting: Physical Therapy

## 2019-10-23 DIAGNOSIS — M6281 Muscle weakness (generalized): Secondary | ICD-10-CM

## 2019-10-23 DIAGNOSIS — R2689 Other abnormalities of gait and mobility: Secondary | ICD-10-CM

## 2019-10-23 NOTE — Therapy (Signed)
[place], [place], [address] Phone: [phone]   Fax:  [phone]  Physical Therapy Treatment/ Discharge Summary   Patient Details  Name: [name] [name] MRN: [id number] Date of Birth: 10-Jun-1956 Referring Provider (PT): Roxan Hockey MD   Encounter Date: 10/23/2019   PHYSICAL THERAPY DISCHARGE SUMMARY  Visits from Start of Care: 20  Current functional level related to goals / functional outcomes: See below   Remaining deficits: See below   Education / Equipment: See assessment  Plan: Patient agrees to discharge.  Patient goals were partially met. Patient is being discharged due to being pleased with the current functional level.  ?????       PT End of Session - 10/23/19 1354    Visit Number  20    Number of Visits  20    Date for PT Re-Evaluation  10/25/19    Authorization Type  [employer] (no auth, no visit limit)    Authorization - Visit Number  20    Authorization - Number of Visits  20    Progress Note Due on Visit  20    PT Start Time  1346    Equipment Utilized During Treatment  Gait belt   RW   Activity Tolerance  Patient tolerated treatment well    Behavior During Therapy  [facility] for tasks assessed/performed       Past Medical History:  Diagnosis Date  . Anemia   . Anxiety   . Arthritis   . Asthma   . Atrial fibrillation ([place])   . Cirrhosis of liver ([place])   . COPD (chronic obstructive pulmonary disease) ([place])   . Coronary artery disease   . Depression   . Essential hypertension   . GERD (gastroesophageal reflux disease)   . Hypothyroidism   . Morbid obesity ([place])   . Overactive bladder   . Sleep apnea    CPAP - not consistent  . Thyroid cancer ([place])   . Type 2 diabetes mellitus ([place])     Past Surgical History:  Procedure Laterality Date  . "pump bumps"     bilateral heels--2000  . ABDOMINAL HYSTERECTOMY  1985  . CARDIAC CATHETERIZATION  2012   Dr. Lia Foyer  told her nothing was wrong.  [name] [place]  . CHOLECYSTECTOMY  1996  . COLONOSCOPY WITH PROPOFOL N/A 04/17/2015   Procedure: COLONOSCOPY WITH PROPOFOL  at cecum at 0810; withdrawal time =15 minutes;  Surgeon: [name], MD;  Location: [facility];  Service: Endoscopy;  Laterality: N/A;  . Debridement of abdominal wound  2011  . ESOPHAGEAL DILATION N/A 04/17/2015   Procedure: ESOPHAGEAL DILATION WITH 56FR MALONEY DILATOR;  Surgeon: [name], MD;  Location: [facility];  Service: Endoscopy;  Laterality: N/A;  . ESOPHAGOGASTRODUODENOSCOPY (EGD) WITH PROPOFOL N/A 04/17/2015   Procedure: ESOPHAGOGASTRODUODENOSCOPY (EGD) WITH PROPOFOL;  Surgeon: [name], MD;  Location: [facility];  Service: Endoscopy;  Laterality: N/A;  . EXPLORATORY LAPAROTOMY  2003  . FOOT SURGERY Bilateral 2000   Bone spur removed  . FOOT SURGERY    . INCISIONAL HERNIA REPAIR N/A 06/09/2014   Procedure: RECURRENT  INCISIONAL HERNIORRHAPHY WITH MESH;  Surgeon: [name], MD;  Location: [facility];  Service: General;  Laterality: N/A;  . INCISIONAL HERNIA REPAIR N/A 10/15/2014   Procedure: Delia;  Surgeon: [name], MD;  Location: [facility];  Service: General;  Laterality: N/A;  . INSERTION OF MESH  N/A 06/09/2014   Procedure: INSERTION OF MESH;  Surgeon: [name], MD;  Location: [facility];  Service: General;  Laterality: N/A;  . INSERTION OF MESH N/A 10/15/2014   Procedure: INSERTION OF MESH;  Surgeon: [name], MD;  Location: [facility];  Service: General;  Laterality: N/A;  . POLYPECTOMY  04/17/2015   Procedure: POLYPECTOMY;  Surgeon: [name], MD;  Location: [facility];  Service: Endoscopy;;  . RESECTION DISTAL CLAVICAL  05/07/2012   Procedure: RESECTION DISTAL CLAVICAL;  Surgeon: [name], MD;  Location: [facility];  Service: Orthopedics;  Laterality: Right;  . SHOULDER OPEN ROTATOR CUFF REPAIR  05/07/2012   Procedure: ROTATOR CUFF REPAIR SHOULDER OPEN;   Surgeon: [name], MD;  Location: [facility];  Service: Orthopedics;  Laterality: Right;  . TEE WITHOUT CARDIOVERSION N/A 12/26/2018   Procedure: TRANSESOPHAGEAL ECHOCARDIOGRAM (TEE) WITH PROPOFOL;  Surgeon: Arnoldo Lenis, MD;  [facility];  Service: Endoscopy;  Laterality: N/A;  . THYROIDECTOMY  2009  . TONSILLECTOMY  1958  . UMBILICAL HERNIA REPAIR  2010    There were no vitals filed for this visit.  Subjective Assessment - 10/23/19 1350    Subjective  Patient says her knee continues to "give her a fit". Says it was popping yesterday which worries her that she may fall sometimes. Denies recent. Says she feels her balance has improved some and is doing better in her house, but is still leery about some things outside. Says she feels this is more due to her knee issues.    Limitations  Lifting;Standing;Walking;House hold activities    How long can you stand comfortably?  8 minutes    How long can you walk comfortably?  8 minutes with RW    Patient Stated Goals  get back where I can feel better and do some stuff    Currently in Pain?  Yes    Pain Score  8     Pain Location  Knee    Pain Orientation  Right    Pain Descriptors / Indicators  Aching    Pain Type  Chronic pain    Pain Onset  More than a month ago    Pain Frequency  Constant    Aggravating Factors   WB, walking, standing    Pain Relieving Factors  rest, meds    Effect of Pain on Daily Activities  Limits         [facility] PT Assessment - 10/23/19 0001      Assessment   Medical Diagnosis  Generalized Weakness    Referring Provider (PT)  Roxan Hockey MD    Onset Date/Surgical Date  06/20/19    Prior Therapy  Yes      Precautions   Precautions  Fall      Restrictions   Weight Bearing Restrictions  No      Balance Screen   Has the patient fallen in the past 6 months  Yes    How many times?  4    Has the patient had a decrease in activity level because of a fear of falling?   Yes    Is the  patient reluctant to leave their home because of a fear of falling?   No      Home Environment   Living Environment  Private residence    Living Arrangements  Alone      Prior Function   Level of Independence  Independent with household mobility with device  Cognition   Overall Cognitive Status  Within Functional Limits for tasks assessed      Strength   Overall Strength Comments  tested in seated     Right Hip Flexion  4+/5   was 4   Right Hip Extension  4/5   was 4-   Right Hip ABduction  4+/5    Left Hip Flexion  4/5   was 4   Left Hip Extension  4+/5    Left Hip ABduction  4+/5    Right Knee Flexion  4+/5    Right Knee Extension  4+/5    Left Knee Flexion  4+/5    Left Knee Extension  5/5      Ambulation/Gait   Ambulation/Gait  Yes    Ambulation/Gait Assistance  6: Modified independent (Device/Increase time)    Ambulation Distance (Feet)  230 Feet    Assistive device  Large base quad cane    Gait Pattern  Decreased hip/knee flexion - right;Decreased stride length    Ambulation Surface  Level;Indoor    Stairs  Yes    Stairs Assistance  6: Modified independent (Device/Increase time)    Stair Management Technique  Two rails;Step to pattern    Number of Stairs  4    Height of Stairs  7    Gait Comments  2MWT      Static Standing Balance   Static Standing Balance -  Activities   Tandam Stance - Right Leg;Tandam Stance - Left Leg    Static Standing - Comment/# of Minutes  30 sec, 30 sec                           PT Education - 10/23/19 1353    Education Details  On reassessment findings progress to goals and transition to home exercise    Person(s) Educated  Patient    Methods  Explanation    Comprehension  Verbalized understanding       PT Short Term Goals - 07/31/19 1100      PT SHORT TERM GOAL #1   Title  Patient will be independent with initial HEP to improve functional outcomes    Time  3    Period  Weeks    Status  Achieved     Target Date  08/02/19        PT Long Term Goals - 10/23/19 1415      PT LONG TERM GOAL #1   Title  Patient will be able to perform stand x 5 in < 15 seconds to demonstrate improvement in functional mobility and reduced risk for falls.    Baseline  Current: 14.1 sec with no UE    Time  6    Period  Weeks    Status  Achieved      PT LONG TERM GOAL #2   Title  Patient will be able to maintain tandem stance >30 seconds on BLEs to improve stability and reduce risk for falls    Baseline  Current: 30 sec RT; 30 sec LT in staggered stance    Time  6    Period  Weeks    Status  Partially Met      PT LONG TERM GOAL #3   Title  Patient will be able to ambulate at least 275 feet during 2MWT with LRAD to demonstrate improved ability to perform functional mobility and associated tasks.    Baseline  Current: 230 feet with  large base quad cane    Time  6    Period  Weeks    Status  Not Met      PT LONG TERM GOAL #4   Title  Patient will report at least 60% overall improvement in subjective complaint to indicate improvement in ability to perform ADLs.    Baseline  Current: 65-75%    Time  6    Period  Weeks    Status  Achieved            Plan - 10/23/19 1422    Clinical Impression Statement  Patient has made good progress toward therapy goals.  Patient currently with 1/1 short term and 2/4 long term goals met. Patient has made improvement with LE strength and static balance, but continues to be limited in functional mobility primarily by ongoing RT knee pain. At this point, patient has reached maximal benefit of therapy given current presentation of deficits and will be discharged to transition to home program. Patient instructed to follow up with presiding ortho MD about ongoing issued to evaluate and address further. Patient instructed to follow up with physical therapy services with any further questions or concerns.    Personal Factors and Comorbidities  Age;Comorbidity 3+;Fitness     Comorbidities  DM, COPD, HTN    Examination-Activity Limitations  Continence;Stairs;Locomotion Level;Squat;Lift    Examination-Participation Restrictions  Cleaning;Community Activity    Stability/Clinical Decision Making  Stable/Uncomplicated    Rehab Potential  Good    PT Treatment/Interventions  ADLs/Self Care Home Management;Aquatic Therapy;Cryotherapy;Electrical Stimulation;Moist Heat;Gait training;Stair training;Functional mobility training;DME Instruction;Therapeutic activities;Therapeutic exercise;Balance training;Neuromuscular re-education;Patient/family education;Manual techniques;Joint Manipulations;Passive range of motion;Energy conservation;Splinting;Taping    PT Next Visit Plan  DC    PT Home Exercise Plan  07/10/19: seated marching, LAQ, heel/ toe raises seated; 1/27:  diapharagmic breathing, scapular retraction, abdominal isometric( round the clock), self manual to decrease abdominal lymphatic volume, sit to stand.    Consulted and Agree with Plan of Care  Patient       Patient will benefit from skilled therapeutic intervention in order to improve the following deficits and impairments:  Abnormal gait, Decreased balance, Decreased mobility, Decreased skin integrity, Difficulty walking, Obesity, Decreased strength, Decreased activity tolerance, Decreased safety awareness, Decreased knowledge of use of DME, Increased edema, Improper body mechanics  Visit Diagnosis: Muscle weakness (generalized)  Other abnormalities of gait and mobility     Problem List Patient Active Problem List   Diagnosis Date Noted  . Acute lower UTI 06/22/2019  . Hypokalemia 06/20/2019  . AKI (acute kidney injury) ([place]) 06/01/2019  . NASH (nonalcoholic steatohepatitis) 06/01/2019  . Pressure injury of skin 05/31/2019  . Candidiasis, intertrigo 05/31/2019  . Yeast infection 05/31/2019  . Fever 12/24/2018  . Hyponatremia 12/24/2018  . Abnormal liver function 12/24/2018  . Sepsis due to undetermined  organism ([place]) 11/07/2018  . Atrial fibrillation ([place]) 11/07/2018  . COPD (chronic obstructive pulmonary disease) ([place]) 11/07/2018  . GERD (gastroesophageal reflux disease) 11/07/2018  . Hypothyroidism 11/07/2018  . Sleep apnea 11/07/2018  . Hepatic cirrhosis ([place]) 09/01/2014  . Cirrhosis, nonalcoholic ([place]) 25/10/3974  . Incisional hernia 06/09/2014  . S/P complete repair of rotator cuff 07/24/2012  . Precordial pain 09/30/2010  . Essential hypertension, benign 09/30/2010  . Type 2 diabetes mellitus ([place]) 09/05/2008  . OBESITY-MORBID (>100') 09/05/2008  . ESOPHAGEAL REFLUX 09/05/2008  . EDEMA 09/05/2008    2:27 PM, 10/23/19 Josue Hector PT DPT  Physical Therapist with [facility]  [phone]  [place] [address] [place], [place], [address] Phone: [phone]   Fax:  [phone]  Name: LLESENIA FOGAL MRN: [id number] Date of Birth: [birth year]

## 2019-10-29 DIAGNOSIS — E063 Autoimmune thyroiditis: Secondary | ICD-10-CM | POA: Diagnosis not present

## 2019-10-29 DIAGNOSIS — I10 Essential (primary) hypertension: Secondary | ICD-10-CM | POA: Diagnosis not present

## 2019-10-29 DIAGNOSIS — I700 Atherosclerosis of aorta: Secondary | ICD-10-CM | POA: Diagnosis not present

## 2019-10-29 DIAGNOSIS — E538 Deficiency of other specified B group vitamins: Secondary | ICD-10-CM | POA: Diagnosis not present

## 2019-10-29 DIAGNOSIS — Z6841 Body Mass Index (BMI) 40.0 and over, adult: Secondary | ICD-10-CM | POA: Diagnosis not present

## 2019-10-29 DIAGNOSIS — N6011 Diffuse cystic mastopathy of right breast: Secondary | ICD-10-CM | POA: Diagnosis not present

## 2019-10-29 DIAGNOSIS — R300 Dysuria: Secondary | ICD-10-CM | POA: Diagnosis not present

## 2019-10-29 DIAGNOSIS — E118 Type 2 diabetes mellitus with unspecified complications: Secondary | ICD-10-CM | POA: Diagnosis not present

## 2019-10-30 ENCOUNTER — Other Ambulatory Visit (HOSPITAL_COMMUNITY): Payer: Self-pay | Admitting: Internal Medicine

## 2019-10-30 DIAGNOSIS — Z1231 Encounter for screening mammogram for malignant neoplasm of breast: Secondary | ICD-10-CM

## 2019-11-08 DIAGNOSIS — R609 Edema, unspecified: Secondary | ICD-10-CM | POA: Diagnosis not present

## 2019-11-19 DIAGNOSIS — I872 Venous insufficiency (chronic) (peripheral): Secondary | ICD-10-CM | POA: Diagnosis not present

## 2019-11-19 DIAGNOSIS — L03119 Cellulitis of unspecified part of limb: Secondary | ICD-10-CM | POA: Diagnosis not present

## 2019-11-19 DIAGNOSIS — E1140 Type 2 diabetes mellitus with diabetic neuropathy, unspecified: Secondary | ICD-10-CM | POA: Diagnosis not present

## 2019-11-22 DIAGNOSIS — E119 Type 2 diabetes mellitus without complications: Secondary | ICD-10-CM | POA: Diagnosis not present

## 2019-12-02 DIAGNOSIS — R609 Edema, unspecified: Secondary | ICD-10-CM | POA: Diagnosis not present

## 2019-12-09 ENCOUNTER — Ambulatory Visit (HOSPITAL_COMMUNITY)
Admission: RE | Admit: 2019-12-09 | Discharge: 2019-12-09 | Disposition: A | Discharge status: Home / Self Care | Payer: PPO | Source: Ambulatory Visit | Attending: Internal Medicine | Admitting: Internal Medicine

## 2019-12-09 ENCOUNTER — Other Ambulatory Visit: Payer: Self-pay

## 2019-12-09 DIAGNOSIS — Z1231 Encounter for screening mammogram for malignant neoplasm of breast: Secondary | ICD-10-CM

## 2019-12-10 DIAGNOSIS — R609 Edema, unspecified: Secondary | ICD-10-CM | POA: Diagnosis not present

## 2019-12-10 DIAGNOSIS — Z79899 Other long term (current) drug therapy: Secondary | ICD-10-CM | POA: Diagnosis not present

## 2019-12-10 DIAGNOSIS — R5383 Other fatigue: Secondary | ICD-10-CM | POA: Diagnosis not present

## 2019-12-18 DIAGNOSIS — M170 Bilateral primary osteoarthritis of knee: Secondary | ICD-10-CM | POA: Diagnosis not present

## 2019-12-18 DIAGNOSIS — I872 Venous insufficiency (chronic) (peripheral): Secondary | ICD-10-CM | POA: Diagnosis not present

## 2019-12-18 DIAGNOSIS — E063 Autoimmune thyroiditis: Secondary | ICD-10-CM | POA: Diagnosis not present

## 2019-12-18 DIAGNOSIS — E1140 Type 2 diabetes mellitus with diabetic neuropathy, unspecified: Secondary | ICD-10-CM | POA: Diagnosis not present

## 2020-01-07 DIAGNOSIS — B359 Dermatophytosis, unspecified: Secondary | ICD-10-CM | POA: Diagnosis not present

## 2020-01-07 DIAGNOSIS — E538 Deficiency of other specified B group vitamins: Secondary | ICD-10-CM | POA: Diagnosis not present

## 2020-01-07 DIAGNOSIS — Z6841 Body Mass Index (BMI) 40.0 and over, adult: Secondary | ICD-10-CM | POA: Diagnosis not present

## 2020-01-07 DIAGNOSIS — I872 Venous insufficiency (chronic) (peripheral): Secondary | ICD-10-CM | POA: Diagnosis not present

## 2020-01-14 DIAGNOSIS — M1711 Unilateral primary osteoarthritis, right knee: Secondary | ICD-10-CM | POA: Diagnosis not present

## 2020-01-14 DIAGNOSIS — E118 Type 2 diabetes mellitus with unspecified complications: Secondary | ICD-10-CM | POA: Diagnosis not present

## 2020-01-17 DIAGNOSIS — E1140 Type 2 diabetes mellitus with diabetic neuropathy, unspecified: Secondary | ICD-10-CM | POA: Diagnosis not present

## 2020-01-17 DIAGNOSIS — I872 Venous insufficiency (chronic) (peripheral): Secondary | ICD-10-CM | POA: Diagnosis not present

## 2020-01-17 DIAGNOSIS — M170 Bilateral primary osteoarthritis of knee: Secondary | ICD-10-CM | POA: Diagnosis not present

## 2020-01-17 DIAGNOSIS — E063 Autoimmune thyroiditis: Secondary | ICD-10-CM | POA: Diagnosis not present

## 2020-01-21 ENCOUNTER — Other Ambulatory Visit: Payer: Self-pay | Admitting: Cardiology

## 2020-03-05 IMAGING — DX CHEST  1 VIEW
1 series · 1 of 1 positions shown · non-contrast
Comparison: Portable exam 7677 hours compared to 12/23/2018

CLINICAL DATA: PICC line placement

EXAM:
CHEST  1 VIEW

[chest pa]
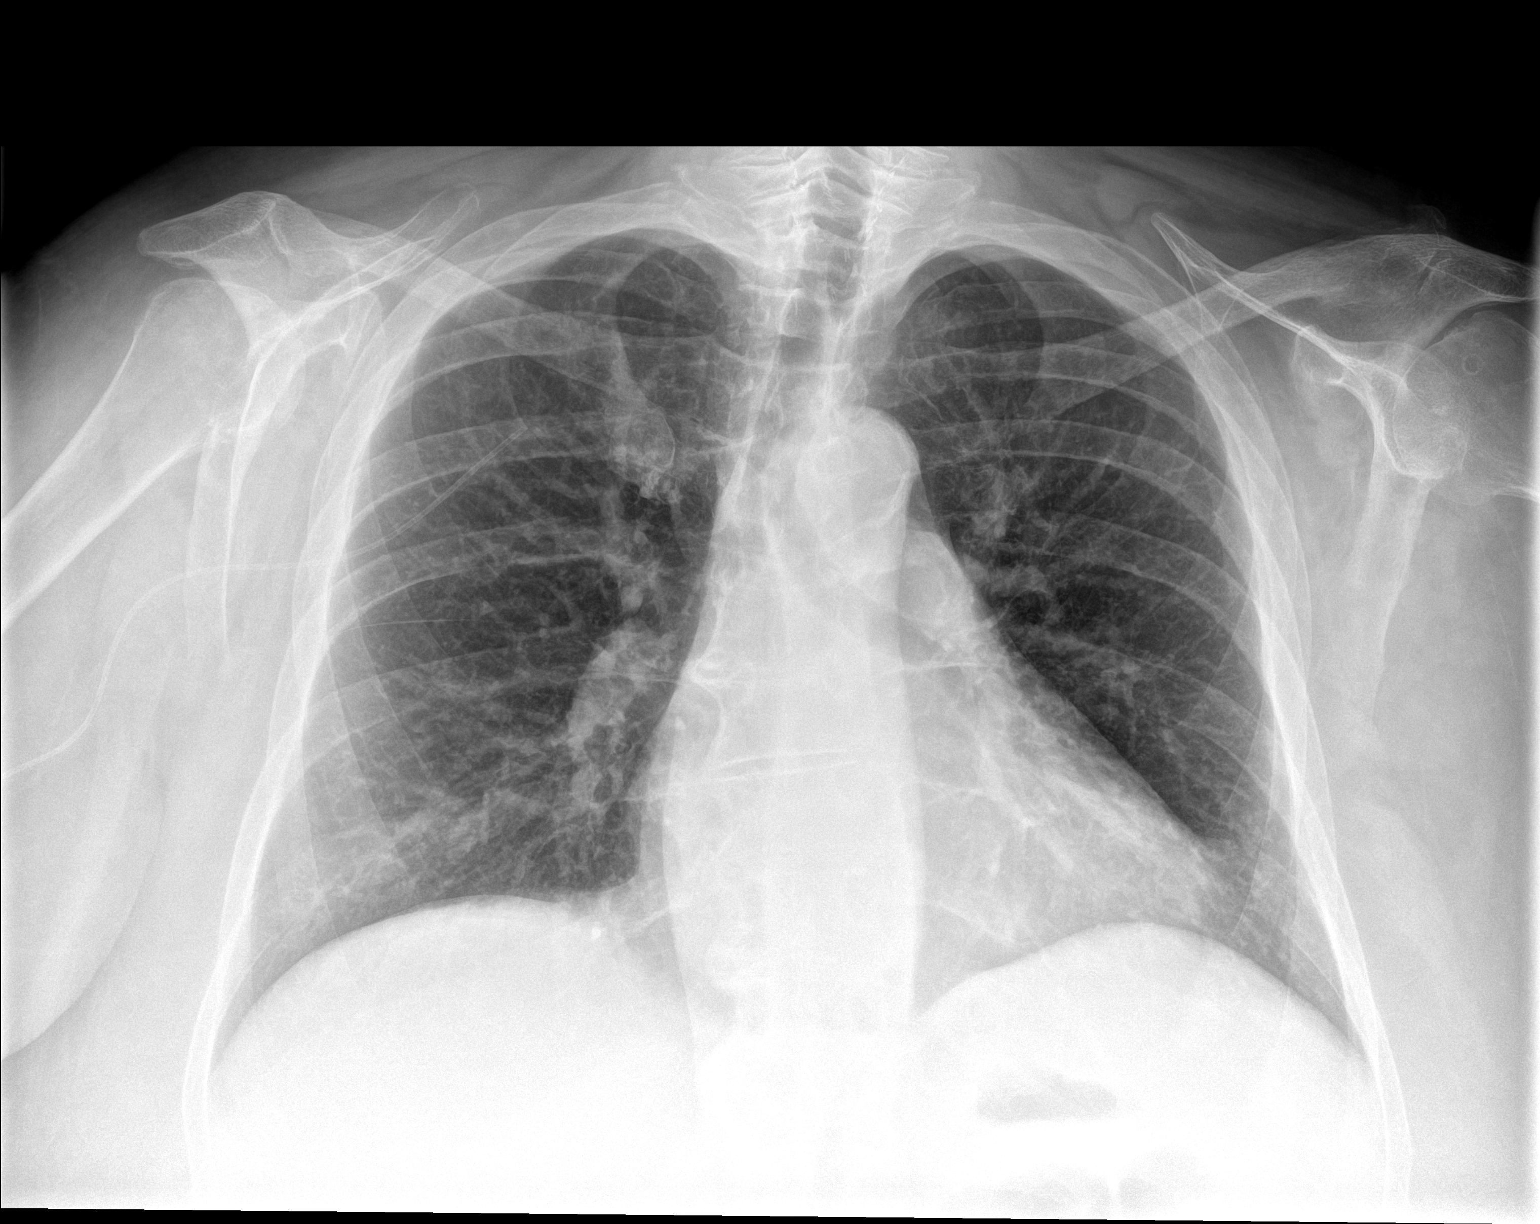

[1 of 1 positions shown; findings below may reference images not displayed]

FINDINGS: Tip of RIGHT arm PICC line projects over the RIGHT upper chest,
atypical course for axillary/subclavian vein, question within
branch.

Normal heart size, mediastinal contours, and pulmonary vascularity.

Atherosclerotic calcification aorta.

Lungs clear.

No infiltrate, pleural effusion or pneumothorax.
IMPRESSION: Tip of RIGHT arm PICC line projects over the upper RIGHT chest,
atypical for the usual course of the RIGHT axillary and subclavian
veins, suspect within a chest wall vessel; recommend repositioning.

Findings called to [place] RN in [REDACTED] on 01/07/2019 at 6365
hours.

## 2020-03-10 DIAGNOSIS — L309 Dermatitis, unspecified: Secondary | ICD-10-CM | POA: Diagnosis not present

## 2020-03-19 DIAGNOSIS — E063 Autoimmune thyroiditis: Secondary | ICD-10-CM | POA: Diagnosis not present

## 2020-03-19 DIAGNOSIS — I872 Venous insufficiency (chronic) (peripheral): Secondary | ICD-10-CM | POA: Diagnosis not present

## 2020-03-19 DIAGNOSIS — M170 Bilateral primary osteoarthritis of knee: Secondary | ICD-10-CM | POA: Diagnosis not present

## 2020-03-19 DIAGNOSIS — E1140 Type 2 diabetes mellitus with diabetic neuropathy, unspecified: Secondary | ICD-10-CM | POA: Diagnosis not present

## 2020-03-27 DIAGNOSIS — R42 Dizziness and giddiness: Secondary | ICD-10-CM | POA: Diagnosis not present

## 2020-03-27 DIAGNOSIS — E1140 Type 2 diabetes mellitus with diabetic neuropathy, unspecified: Secondary | ICD-10-CM | POA: Diagnosis not present

## 2020-03-27 DIAGNOSIS — T50905A Adverse effect of unspecified drugs, medicaments and biological substances, initial encounter: Secondary | ICD-10-CM | POA: Diagnosis not present

## 2020-04-12 ENCOUNTER — Emergency Department (HOSPITAL_COMMUNITY): Payer: PPO

## 2020-04-12 ENCOUNTER — Encounter (HOSPITAL_COMMUNITY): Payer: Self-pay | Admitting: Emergency Medicine

## 2020-04-12 ENCOUNTER — Other Ambulatory Visit: Payer: Self-pay

## 2020-04-12 ENCOUNTER — Emergency Department (HOSPITAL_COMMUNITY)
Admission: EM | Admit: 2020-04-12 | Discharge: 2020-04-14 | Disposition: A | Discharge status: Home / Self Care | Payer: PPO | Source: Home / Self Care | Attending: Emergency Medicine | Admitting: Emergency Medicine

## 2020-04-12 ENCOUNTER — Emergency Department (HOSPITAL_COMMUNITY)
Admission: EM | Admit: 2020-04-12 | Discharge: 2020-04-12 | Disposition: A | Discharge status: Home / Self Care | Payer: PPO | Attending: Emergency Medicine | Admitting: Emergency Medicine

## 2020-04-12 DIAGNOSIS — L304 Erythema intertrigo: Secondary | ICD-10-CM | POA: Insufficient documentation

## 2020-04-12 DIAGNOSIS — I2510 Atherosclerotic heart disease of native coronary artery without angina pectoris: Secondary | ICD-10-CM | POA: Insufficient documentation

## 2020-04-12 DIAGNOSIS — E119 Type 2 diabetes mellitus without complications: Secondary | ICD-10-CM | POA: Insufficient documentation

## 2020-04-12 DIAGNOSIS — Z79899 Other long term (current) drug therapy: Secondary | ICD-10-CM | POA: Insufficient documentation

## 2020-04-12 DIAGNOSIS — R531 Weakness: Secondary | ICD-10-CM

## 2020-04-12 DIAGNOSIS — R55 Syncope and collapse: Secondary | ICD-10-CM | POA: Diagnosis not present

## 2020-04-12 DIAGNOSIS — Z85850 Personal history of malignant neoplasm of thyroid: Secondary | ICD-10-CM | POA: Diagnosis not present

## 2020-04-12 DIAGNOSIS — R0789 Other chest pain: Secondary | ICD-10-CM | POA: Diagnosis not present

## 2020-04-12 DIAGNOSIS — R000 Tachycardia, unspecified: Secondary | ICD-10-CM | POA: Insufficient documentation

## 2020-04-12 DIAGNOSIS — E039 Hypothyroidism, unspecified: Secondary | ICD-10-CM | POA: Insufficient documentation

## 2020-04-12 DIAGNOSIS — I10 Essential (primary) hypertension: Secondary | ICD-10-CM | POA: Insufficient documentation

## 2020-04-12 DIAGNOSIS — J45909 Unspecified asthma, uncomplicated: Secondary | ICD-10-CM | POA: Insufficient documentation

## 2020-04-12 DIAGNOSIS — R079 Chest pain, unspecified: Secondary | ICD-10-CM | POA: Diagnosis not present

## 2020-04-12 DIAGNOSIS — Z7901 Long term (current) use of anticoagulants: Secondary | ICD-10-CM | POA: Insufficient documentation

## 2020-04-12 DIAGNOSIS — R21 Rash and other nonspecific skin eruption: Secondary | ICD-10-CM | POA: Diagnosis not present

## 2020-04-12 DIAGNOSIS — Z20822 Contact with and (suspected) exposure to covid-19: Secondary | ICD-10-CM | POA: Insufficient documentation

## 2020-04-12 DIAGNOSIS — B372 Candidiasis of skin and nail: Secondary | ICD-10-CM

## 2020-04-12 DIAGNOSIS — Z794 Long term (current) use of insulin: Secondary | ICD-10-CM | POA: Insufficient documentation

## 2020-04-12 DIAGNOSIS — I4891 Unspecified atrial fibrillation: Secondary | ICD-10-CM

## 2020-04-12 DIAGNOSIS — J449 Chronic obstructive pulmonary disease, unspecified: Secondary | ICD-10-CM | POA: Insufficient documentation

## 2020-04-12 DIAGNOSIS — R6889 Other general symptoms and signs: Secondary | ICD-10-CM | POA: Diagnosis not present

## 2020-04-12 DIAGNOSIS — Z743 Need for continuous supervision: Secondary | ICD-10-CM | POA: Diagnosis not present

## 2020-04-12 DIAGNOSIS — T782XXA Anaphylactic shock, unspecified, initial encounter: Secondary | ICD-10-CM | POA: Diagnosis not present

## 2020-04-12 DIAGNOSIS — I119 Hypertensive heart disease without heart failure: Secondary | ICD-10-CM | POA: Insufficient documentation

## 2020-04-12 LAB — CBC WITH DIFFERENTIAL/PLATELET
Abs Immature Granulocytes: 0.03 10*3/uL (ref 0.00–0.07)
Abs Immature Granulocytes: 0.02 10*3/uL (ref 0.00–0.07)
Basophils Absolute: 0 10*3/uL (ref 0.0–0.1)
Basophils Absolute: 0 10*3/uL (ref 0.0–0.1)
Basophils Relative: 1 %
Basophils Relative: 1 %
Eosinophils Absolute: 0.1 10*3/uL (ref 0.0–0.5)
Eosinophils Absolute: 0.1 10*3/uL (ref 0.0–0.5)
Eosinophils Relative: 1 %
Eosinophils Relative: 1 %
HCT: 30.6 % — ABNORMAL LOW (ref 36.0–46.0)
HCT: 31.1 % — ABNORMAL LOW (ref 36.0–46.0)
Hemoglobin: 8.9 g/dL — ABNORMAL LOW (ref 12.0–15.0)
Hemoglobin: 9 g/dL — ABNORMAL LOW (ref 12.0–15.0)
Immature Granulocytes: 0 %
Immature Granulocytes: 0 %
Lymphocytes Relative: 9 %
Lymphocytes Relative: 13 %
Lymphs Abs: 0.8 10*3/uL (ref 0.7–4.0)
Lymphs Abs: 1 10*3/uL (ref 0.7–4.0)
MCH: 22.5 pg — ABNORMAL LOW (ref 26.0–34.0)
MCH: 22.1 pg — ABNORMAL LOW (ref 26.0–34.0)
MCHC: 29.1 g/dL — ABNORMAL LOW (ref 30.0–36.0)
MCHC: 28.9 g/dL — ABNORMAL LOW (ref 30.0–36.0)
MCV: 77.3 fL — ABNORMAL LOW (ref 80.0–100.0)
MCV: 76.4 fL — ABNORMAL LOW (ref 80.0–100.0)
Monocytes Absolute: 0.6 10*3/uL (ref 0.1–1.0)
Monocytes Absolute: 0.6 10*3/uL (ref 0.1–1.0)
Monocytes Relative: 7 %
Monocytes Relative: 8 %
Neutro Abs: 7.2 10*3/uL (ref 1.7–7.7)
Neutro Abs: 5.8 10*3/uL (ref 1.7–7.7)
Neutrophils Relative %: 82 %
Neutrophils Relative %: 77 %
Platelets: 212 10*3/uL (ref 150–400)
Platelets: 209 10*3/uL (ref 150–400)
RBC: 3.96 MIL/uL (ref 3.87–5.11)
RBC: 4.07 MIL/uL (ref 3.87–5.11)
RDW: 16.8 % — ABNORMAL HIGH (ref 11.5–15.5)
RDW: 17 % — ABNORMAL HIGH (ref 11.5–15.5)
WBC: 8.8 10*3/uL (ref 4.0–10.5)
WBC: 7.5 10*3/uL (ref 4.0–10.5)
nRBC: 0 % (ref 0.0–0.2)
nRBC: 0 % (ref 0.0–0.2)

## 2020-04-12 LAB — URINALYSIS, ROUTINE W REFLEX MICROSCOPIC
Bacteria, UA: NONE SEEN
Bilirubin Urine: NEGATIVE
Glucose, UA: NEGATIVE mg/dL
Ketones, ur: 20 mg/dL — AB
Leukocytes,Ua: NEGATIVE
Nitrite: NEGATIVE
Protein, ur: 30 mg/dL — AB
Specific Gravity, Urine: 1.013 (ref 1.005–1.030)
pH: 5 (ref 5.0–8.0)

## 2020-04-12 LAB — COMPREHENSIVE METABOLIC PANEL
ALT: 17 U/L (ref 0–44)
ALT: 16 U/L (ref 0–44)
AST: 26 U/L (ref 15–41)
AST: 23 U/L (ref 15–41)
Albumin: 3.1 g/dL — ABNORMAL LOW (ref 3.5–5.0)
Albumin: 2.7 g/dL — ABNORMAL LOW (ref 3.5–5.0)
Alkaline Phosphatase: 146 U/L — ABNORMAL HIGH (ref 38–126)
Alkaline Phosphatase: 131 U/L — ABNORMAL HIGH (ref 38–126)
Anion gap: 9 (ref 5–15)
Anion gap: 10 (ref 5–15)
BUN: 11 mg/dL (ref 8–23)
BUN: 9 mg/dL (ref 8–23)
CO2: 24 mmol/L (ref 22–32)
CO2: 26 mmol/L (ref 22–32)
Calcium: 8.7 mg/dL — ABNORMAL LOW (ref 8.9–10.3)
Calcium: 8.5 mg/dL — ABNORMAL LOW (ref 8.9–10.3)
Chloride: 99 mmol/L (ref 98–111)
Chloride: 100 mmol/L (ref 98–111)
Creatinine, Ser: 0.73 mg/dL (ref 0.44–1.00)
Creatinine, Ser: 0.72 mg/dL (ref 0.44–1.00)
GFR, Estimated: >60 mL/min (ref >60)
GFR, Estimated: >60 mL/min (ref >60)
Glucose, Bld: 145 mg/dL — ABNORMAL HIGH (ref 70–99)
Glucose, Bld: 144 mg/dL — ABNORMAL HIGH (ref 70–99)
Potassium: 3.5 mmol/L (ref 3.5–5.1)
Potassium: 3.1 mmol/L — ABNORMAL LOW (ref 3.5–5.1)
Sodium: 132 mmol/L — ABNORMAL LOW (ref 135–145)
Sodium: 136 mmol/L (ref 135–145)
Total Bilirubin: 1.2 mg/dL (ref 0.3–1.2)
Total Bilirubin: 1 mg/dL (ref 0.3–1.2)
Total Protein: 7.4 g/dL (ref 6.5–8.1)
Total Protein: 7 g/dL (ref 6.5–8.1)

## 2020-04-12 LAB — BLOOD GAS, ARTERIAL
Acid-Base Excess: 1 mmol/L (ref 0.0–2.0)
Bicarbonate: 25.5 mmol/L (ref 20.0–28.0)
FIO2: 21
O2 Saturation: 98.2 %
Patient temperature: 37
pCO2 arterial: 34.8 mmHg (ref 32.0–48.0)
pH, Arterial: 7.461 — ABNORMAL HIGH (ref 7.350–7.450)
pO2, Arterial: 109 mmHg — ABNORMAL HIGH (ref 83.0–108.0)

## 2020-04-12 LAB — MAGNESIUM: Magnesium: 1.7 mg/dL (ref 1.7–2.4)

## 2020-04-12 LAB — AMMONIA: Ammonia: <9 umol/L — ABNORMAL LOW (ref 9–35)

## 2020-04-12 LAB — LACTIC ACID, PLASMA: Lactic Acid, Venous: 1.3 mmol/L (ref 0.5–1.9)

## 2020-04-12 LAB — PHOSPHORUS: Phosphorus: 3.3 mg/dL (ref 2.5–4.6)

## 2020-04-12 MED ORDER — SODIUM CHLORIDE 0.9 % IV SOLN: INTRAVENOUS | Status: DC

## 2020-04-12 MED ORDER — FLUCONAZOLE 100 MG PO TABS
200.0000 mg | ORAL_TABLET | Freq: Once | ORAL | Status: AC
  Administered 2020-04-12: 200 mg via ORAL
  Filled 2020-04-12: qty 2

## 2020-04-12 MED ORDER — DILTIAZEM HCL 30 MG PO TABS
60.0000 mg | ORAL_TABLET | Freq: Once | ORAL | Status: AC
  Administered 2020-04-12: 60 mg via ORAL
  Filled 2020-04-12: qty 2

## 2020-04-12 MED ORDER — NYSTATIN 100000 UNIT/GM EX CREA: TOPICAL_CREAM | CUTANEOUS | 5 refills | Status: AC

## 2020-04-12 MED ORDER — NYSTATIN 100000 UNIT/GM EX CREA
TOPICAL_CREAM | Freq: Two times a day (BID) | CUTANEOUS | Status: DC
  Filled 2020-04-12: qty 15

## 2020-04-12 MED ORDER — FLUCONAZOLE 150 MG PO TABS: 150.0000 mg | ORAL_TABLET | Freq: Every day | ORAL | 0 refills | Status: AC

## 2020-04-12 MED ORDER — NYSTATIN 100000 UNIT/GM EX POWD
Freq: Two times a day (BID) | CUTANEOUS | Status: DC
  Filled 2020-04-12: qty 15

## 2020-04-12 NOTE — ED Triage Notes (Signed)
[facility] - pt states that she is weak. Was seen here earlier for a rash.

## 2020-04-12 NOTE — ED Triage Notes (Signed)
Pt was using some types of wipes this morning and says "it burned and I have a rash"

## 2020-04-12 NOTE — ED Provider Notes (Addendum)
[facility] EMERGENCY DEPARTMENT Provider Note   CSN: [id number] Arrival date & time: 04/12/20  6754     History Chief Complaint  Patient presents with  . Rash    [name] [name] is a 64 y.o. female.  HPI   This patient is a chronically ill 64 year old female, she has a known history of cirrhosis, atrial fibrillation, she has a history of diabetes, history of thyroid cancer and unfortunately history of morbid obesity which has caused her to have difficulty taking care of her self.  She currently lives alone with her cat, she is followed by a local family primary care physician.  She has struggled with a skin rash over time, this has been treated with topical medications, she cannot recall the name of them at this time and states that she was recently called in some medications which she has been trying to use but states they are not helping.  She had to use a wipe this morning to clean herself and felt like there was significant burning of the skin after using the wipe.  The patient also notes that she has fairly severe anxiety and continually picks at her skin and has multiple picking lesions across her body.  The patient was scheduled to see her doctor because of weakness and difficulty ambulating but because of her symptoms this morning elected to call 911 for a more urgent evaluation.  She reports that she has had a dry mouth, she is not eating well, she does take her medication as prescribed and states her blood sugar this morning was around 130.  Past Medical History:  Diagnosis Date  . Anemia   . Anxiety   . Arthritis   . Asthma   . Atrial fibrillation ([place])   . Cirrhosis of liver ([place])   . COPD (chronic obstructive pulmonary disease) ([place])   . Coronary artery disease   . Depression   . Essential hypertension   . GERD (gastroesophageal reflux disease)   . Hypothyroidism   . Morbid obesity ([place])   . Overactive bladder   . Sleep apnea    CPAP - not consistent  . Thyroid  cancer ([place])   . Type 2 diabetes mellitus [facility])     Patient Active Problem List   Diagnosis Date Noted  . Acute lower UTI 06/22/2019  . Hypokalemia 06/20/2019  . AKI (acute kidney injury) ([place]) 06/01/2019  . NASH (nonalcoholic steatohepatitis) 06/01/2019  . Pressure injury of skin 05/31/2019  . Candidiasis, intertrigo 05/31/2019  . Yeast infection 05/31/2019  . Fever 12/24/2018  . Hyponatremia 12/24/2018  . Abnormal liver function 12/24/2018  . Sepsis due to undetermined organism ([place]) 11/07/2018  . Atrial fibrillation ([place]) 11/07/2018  . COPD (chronic obstructive pulmonary disease) ([place]) 11/07/2018  . GERD (gastroesophageal reflux disease) 11/07/2018  . Hypothyroidism 11/07/2018  . Sleep apnea 11/07/2018  . Hepatic cirrhosis ([place]) 09/01/2014  . Cirrhosis, nonalcoholic ([place]) 49/20/1007  . Incisional hernia 06/09/2014  . S/P complete repair of rotator cuff 07/24/2012  . Precordial pain 09/30/2010  . Essential hypertension, benign 09/30/2010  . Type 2 diabetes mellitus ([place]) 09/05/2008  . OBESITY-MORBID (>100') 09/05/2008  . ESOPHAGEAL REFLUX 09/05/2008  . EDEMA 09/05/2008    Past Surgical History:  Procedure Laterality Date  . "pump bumps"     bilateral heels--2000  . ABDOMINAL HYSTERECTOMY  1985  . CARDIAC CATHETERIZATION  2012   Dr. Lia Foyer told her nothing was wrong.  [name] [place]  . CHOLECYSTECTOMY  1996  .  COLONOSCOPY WITH PROPOFOL N/A 04/17/2015   Procedure: COLONOSCOPY WITH PROPOFOL  at cecum at 0810; withdrawal time =15 minutes;  Surgeon: [name], MD;  Location: [facility] OR[facility];  Service: Endoscopy;  Laterality: N/A;  . Debridement of abdominal wound  2011  . ESOPHAGEAL DILATION N/A 04/17/2015   Procedure: ESOPHAGEAL DILATION WITH 56FR MALONEY DILATOR;  Surgeon: [name], MD;  Location: [facility];  Service: Endoscopy;  Laterality: N/A;  . ESOPHAGOGASTRODUODENOSCOPY (EGD) WITH PROPOFOL N/A 04/17/2015   Procedure: ESOPHAGOGASTRODUODENOSCOPY (EGD)  WITH PROPOFOL;  Surgeon: [name], MD;  Location: [facility];  Service: Endoscopy;  Laterality: N/A;  . EXPLORATORY LAPAROTOMY  2003  . FOOT SURGERY Bilateral 2000   Bone spur removed  . FOOT SURGERY    . INCISIONAL HERNIA REPAIR N/A 06/09/2014   Procedure: RECURRENT  INCISIONAL HERNIORRHAPHY WITH MESH;  Surgeon: [name], MD;  Location: [facility];  Service: General;  Laterality: N/A;  . INCISIONAL HERNIA REPAIR N/A 10/15/2014   Procedure: [place];  Surgeon: [name], MD;  Location: [place];  Service: General;  Laterality: N/A;  . INSERTION OF MESH N/A 06/09/2014   Procedure: INSERTION OF MESH;  Surgeon: [name], MD;  Location: [facility];  Service: General;  Laterality: N/A;  . INSERTION OF MESH N/A 10/15/2014   Procedure: INSERTION OF MESH;  Surgeon: [name], MD;  Location: [place];  Service: General;  Laterality: N/A;  . POLYPECTOMY  04/17/2015   Procedure: POLYPECTOMY;  Surgeon: [name], MD;  Location: [facility];  Service: Endoscopy;;  . RESECTION DISTAL CLAVICAL  05/07/2012   Procedure: RESECTION DISTAL CLAVICAL;  Surgeon: [name], MD;  Location: [facility];  Service: Orthopedics;  Laterality: Right;  . SHOULDER OPEN ROTATOR CUFF REPAIR  05/07/2012   Procedure: ROTATOR CUFF REPAIR SHOULDER OPEN;  Surgeon: [name], MD;  Location: [facility];  Service: Orthopedics;  Laterality: Right;  . TEE WITHOUT CARDIOVERSION N/A 12/26/2018   Procedure: TRANSESOPHAGEAL ECHOCARDIOGRAM (TEE) WITH PROPOFOL;  Surgeon: Arnoldo Lenis, MD;  Location: [facility];  Service: Endoscopy;  Laterality: N/A;  . THYROIDECTOMY  2009  . TONSILLECTOMY  1958  . UMBILICAL HERNIA REPAIR  2010     OB History   No obstetric history on file.     Family History  Problem Relation Age of Onset  . Heart disease Other   . Arthritis Other   . Cancer Other   . Asthma Other   . Diabetes Other   . Kidney disease Other     Social History    Tobacco Use  . Smoking status: Never Smoker  . Smokeless tobacco: Never Used  Vaping Use  . Vaping Use: Never used  Substance Use Topics  . Alcohol use: No    Alcohol/week: 0.0 standard drinks  . Drug use: No    Home Medications Prior to Admission medications   Medication Sig Start Date End Date Taking? Authorizing Provider  acetaminophen (TYLENOL) 325 MG tablet Take 2 tablets (650 mg total) by mouth every 6 (six) hours as needed for mild pain (or Fever >/= 101). 06/26/19   Roxan Hockey, MD  albuterol (PROVENTIL HFA) 108 (90 BASE) MCG/ACT inhaler Inhale 2 puffs into the lungs every 6 (six) hours as needed for wheezing or shortness of breath. Shortness of breath    [provider]  colesevelam (WELCHOL) 625 MG tablet Take 1,875 mg by mouth as directed. 3 tabs am 3 tabs pm  [provider]  diltiazem (TIAZAC) 360 MG 24 hr capsule TAKE ONE CAPSULE (360MG TOTAL) BY MOUTH DAILY Patient taking differently: Take 360 mg by mouth daily.  05/20/19   Strader, Fransisco Hertz, PA-C  DULoxetine (CYMBALTA) 60 MG capsule Take 120 mg by mouth daily.  03/20/15   [provider]  econazole nitrate 1 % cream Apply topically daily. Patient taking differently: Apply 1 application topically daily as needed (for rash irritation).  12/09/15   Tuchman, Richard C, DPM  ELIQUIS 5 MG TABS tablet TAKE (1) TABLET BY MOUTH TWICE DAILY. 01/21/20   Satira Sark, MD  fluconazole (DIFLUCAN) 150 MG tablet Take 1 tablet (150 mg total) by mouth daily for 14 doses. 04/12/20 04/26/20  Noemi Chapel, MD  gabapentin (NEURONTIN) 300 MG capsule Take 300 mg by mouth daily. 10/19/18   [provider]  HYDROcodone-acetaminophen (NORCO/VICODIN) 5-325 MG tablet Take 1 tablet by mouth every 6 (six) hours as needed for moderate pain or severe pain. 06/26/19   Roxan Hockey, MD  hydrOXYzine (ATARAX/VISTARIL) 25 MG tablet Take 25 mg by mouth 4 (four) times daily as needed for itching.  10/12/18    [provider]  insulin aspart (NOVOLOG) 100 UNIT/ML injection insulin aspart (novoLOG) injection 0-9 Units 0-9 Units Subcutaneous, 3 times daily with meals CBG < 70: Implement Hypoglycemia Standing Orders and refer to Hypoglycemia Standing Orders sidebar report  CBG 70 - 120: 0 units  CBG 121 - 150: 0 unit CBG 151 - 200: 2 units CBG 201 - 250: 3 units CBG 251 - 300: 5 units CBG 301 - 350: 7 units  CBG 351 - 400: 9 units CBG > 400: 10 units 06/26/19 06/25/20  Emokpae, Courage, MD  LANTUS SOLOSTAR 100 UNIT/ML Solostar Pen Inject 33 Units into the skin at bedtime.  09/21/18   [provider]  levothyroxine (SYNTHROID) 200 MCG tablet Take 200 mcg by mouth daily before breakfast. Take in addition to 75 mcg for a total of 275 mcg daily    [provider]  levothyroxine (SYNTHROID) 75 MCG tablet Take 1 tablet (75 mcg total) by mouth daily before breakfast. 06/02/19   Johnson, Clanford L, MD  magnesium oxide (MAG-OX) 400 (241.3 Mg) MG tablet Take 1 tablet (400 mg total) by mouth daily. 06/28/19   Lucky Cowboy, MD  methocarbamol (ROBAXIN) 500 MG tablet Take 1 tablet (500 mg total) by mouth every 6 (six) hours as needed for muscle spasms. 12/26/18   Manuella Ghazi, Pratik D, DO  nystatin cream (MYCOSTATIN) Apply to affected area 2 times daily 04/12/20   Noemi Chapel, MD  potassium chloride SA (K-DUR,KLOR-CON) 20 MEQ tablet Take 20 mEq by mouth 2 (two) times daily.  04/27/18   [provider]  pravastatin (PRAVACHOL) 20 MG tablet Take 20 mg by mouth daily. 10/30/18   [provider]  senna-docusate (SENOKOT-S) 8.6-50 MG tablet Take 2 tablets by mouth at bedtime. 06/26/19 06/25/20  Roxan Hockey, MD  tolterodine (DETROL LA) 4 MG 24 hr capsule Take 4 mg by mouth 2 (two) times a day. 08/17/18   [provider]  traZODone (DESYREL) 150 MG tablet Take 1 tablet (150 mg total) by mouth at bedtime. 06/26/19   Roxan Hockey, MD    Allergies    Doxycycline, Adhesive [tape], Biaxin  [clarithromycin], Percocet [oxycodone-acetaminophen], Xarelto [rivaroxaban], Clarithromycin, Clindamycin/lincomycin, Neosporin [neomycin-polymyxin b gu], and Penicillins  Review of Systems   Review of Systems  All other systems reviewed and are negative.   Physical Exam  Updated Vital Signs BP 124/87   Pulse (!) 102   Temp 98.8 F (37.1 C)   Resp 20   Ht 1.651 m (5' 5" )   Wt 117.9 kg   SpO2 99%   BMI 43.27 kg/m   Physical Exam Vitals and nursing note reviewed.  Constitutional:      General: She is not in acute distress.    Appearance: She is well-developed.  HENT:     Head: Normocephalic and atraumatic.     Mouth/Throat:     Pharynx: No oropharyngeal exudate.     Comments: Normal-appearing mucous membranes Eyes:     General: No scleral icterus.       Right eye: No discharge.        Left eye: No discharge.     Conjunctiva/sclera: Conjunctivae normal.     Pupils: Pupils are equal, round, and reactive to light.  Neck:     Thyroid: No thyromegaly.     Vascular: No JVD.  Cardiovascular:     Rate and Rhythm: Normal rate and regular rhythm.     Heart sounds: Normal heart sounds. No murmur heard.  No friction rub. No gallop.   Pulmonary:     Effort: Pulmonary effort is normal. No respiratory distress.     Breath sounds: Normal breath sounds. No wheezing or rales.  Abdominal:     General: Bowel sounds are normal. There is no distension.     Palpations: Abdomen is soft. There is no mass.     Tenderness: There is no abdominal tenderness.  Musculoskeletal:        General: No tenderness. Normal range of motion.     Cervical back: Normal range of motion and neck supple.     Right lower leg: Edema present.     Left lower leg: Edema present.     Comments: Scant bilateral lower extremity edema  Lymphadenopathy:     Cervical: No cervical adenopathy.  Skin:    General: Skin is warm and dry.     Findings: Erythema and rash present.     Comments: The patient has diffuse  erythematous moist and foul-smelling rash to her entire abdominal pannus extending onto the proximal anteromedial thighs and perineum  Neurological:     Mental Status: She is alert.     Coordination: Coordination normal.     Comments: Awake alert and able to move all 4 extremities, she is generally weak but able to sit herself up in the bed and use her arms to pull herself to a seated position, mental status is clear and level of alertness is normal  Psychiatric:     Comments: Mildly anxious appearing     ED Results / Procedures / Treatments   Labs (all labs ordered are listed, but only abnormal results are displayed) Labs Reviewed  CBC WITH DIFFERENTIAL/PLATELET - Abnormal; Notable for the following components:      Result Value   Hemoglobin 8.9 (*)    HCT 30.6 (*)    MCV 77.3 (*)    MCH 22.5 (*)    MCHC 29.1 (*)    RDW 16.8 (*)    All other components within normal limits  COMPREHENSIVE METABOLIC PANEL - Abnormal; Notable for the following components:   Sodium 132 (*)    Glucose, Bld 145 (*)    Calcium 8.7 (*)    Albumin 3.1 (*)    Alkaline Phosphatase 146 (*)    All other components within normal limits  URINALYSIS, ROUTINE W  REFLEX MICROSCOPIC - Abnormal; Notable for the following components:   Hgb urine dipstick SMALL (*)    Ketones, ur 20 (*)    Protein, ur 30 (*)    All other components within normal limits  URINE CULTURE  MAGNESIUM  PHOSPHORUS    EKG EKG Interpretation  Date/Time:  Sunday April 12 2020 07:48:29 EDT Ventricular Rate:  112 PR Interval:    QRS Duration: 96 QT Interval:  320 QTC Calculation: 437 R Axis:   82 Text Interpretation: Atrial fibrillation with tachycardia Nonspecific T wave abnormality since last tracing no significant change Confirmed by Noemi Chapel [phone]) on 04/12/2020 8:25:59 AM   Radiology DG Chest Port 1 View  Result Date: 04/12/2020 CLINICAL DATA:  Weakness, rash. EXAM: PORTABLE CHEST 1 VIEW COMPARISON:  Chest x-ray  dated 06/20/2019 FINDINGS: Heart size and mediastinal contours are within normal limits, stable. Lungs are clear. No pleural effusion. Osseous structures about the chest are unremarkable. IMPRESSION: No active disease.  No evidence of pneumonia or pulmonary edema. Electronically Signed   By: Franki Cabot M.D.   On: 04/12/2020 08:29    Procedures Procedures (including critical care time)  Medications Ordered in ED Medications  nystatin cream (MYCOSTATIN) ( Topical Given 04/12/20 0843)  nystatin (MYCOSTATIN/NYSTOP) topical powder (0 g Topical Hold 04/12/20 1123)  0.9 %  sodium chloride infusion ( Intravenous Stopped 04/12/20 1341)  diltiazem (CARDIZEM) tablet 60 mg (has no administration in time range)  fluconazole (DIFLUCAN) tablet 200 mg (200 mg Oral Given 04/12/20 1119)    ED Course  I have reviewed the triage vital signs and the nursing notes.  Pertinent labs & imaging results that were available during my care of the patient were reviewed by me and considered in my medical decision making (see chart for details).    MDM Rules/Calculators/A&P                          I have reviewed the medical record, it does appear that this patient is on multiple medications including Eliquis for her atrial fibrillation, she takes insulin both as NovoLog and Lantus, she is on Synthroid and has been using nystatin cream.  I tracked this back as far as approximately 1 year ago when she was prescribed this in the emergency department.  At this point the patient will likely need to have testing including a urinalysis, lab work to make sure that she does not have electrolyte or renal dysfunction, will need an EKG to check her QT C interval as she will likely need fluconazole to treat her skin yeast infection and we want to make sure she does not have a prolonged QT prior to prescribing a prolonged course of Diflucan.  The patient is agreeable to this plan.   The pt has unremarkable labs - Hgb is 8.9,  which is at baseline, no sig CMP abnormalities and urine is clear of infection.  Her EKG is non concerning for QT prolongation -however it does show atrial fibrillation.  She is already anticoagulated and taking diltiazem.  This patient will be referred to cardiology as an outpatient, she is hemodynamically stable and at this time is a heart rate of 100.  She is in no respiratory distress, has no chest pain, stable for discharge  Again this patient has a history of chronic A. fib, she is in A. fib here and has a heart rate that ranges between 95 and 115.  I have given her a  dose of her home Cardizem, she is not compliant with her medications at home  Final Clinical Impression(s) / ED Diagnoses Final diagnoses:  Candidal intertrigo  Atrial fibrillation, unspecified type [facility])    Rx / DC Orders ED Discharge Orders         Ordered    fluconazole (DIFLUCAN) 150 MG tablet  Daily        04/12/20 1410    nystatin cream (MYCOSTATIN)        04/12/20 [place]        04/12/20 1420    Face-to-face encounter (required for Medicare/Medicaid patients)       Comments: I Johnna Acosta certify that this patient is under my care and that I, or a nurse practitioner or physician's assistant working with me, had a face-to-face encounter that meets the physician face-to-face encounter requirements with this patient on 04/12/2020. The encounter with the patient was in whole, or in part for the following medical condition(s) which is the primary reason for home health care (List medical condition): poor self care - no resources, physically limited - lives by self.   04/12/20 1420           Noemi Chapel, MD 04/12/20 1414    Noemi Chapel, MD 04/12/20 [phone]

## 2020-04-12 NOTE — ED Notes (Signed)
Pt noted in a fib  Dr Jerilynn Mages made aware and repeat EKG which showed a fib   Pt med ordered w watch for one hour to see if improvement

## 2020-04-12 NOTE — ED Provider Notes (Signed)
[facility] EMERGENCY DEPARTMENT Provider Note   CSN: [id number] Arrival date & time: 04/12/20  1901     History Chief Complaint  Patient presents with   Weakness    [name] [name] is a 64 y.o. female.  Pt presents to the ED today with weakness.  She left the ED this afternoon via cab.  She said she was weak when she got out of the cab and did not even go into her house.  She called an ambulance to bring her back.        Past Medical History:  Diagnosis Date   Anemia    Anxiety    Arthritis    Asthma    Atrial fibrillation ([place])    Cirrhosis of liver (HCC)    COPD (chronic obstructive pulmonary disease) ([place])    Coronary artery disease    Depression    Essential hypertension    GERD (gastroesophageal reflux disease)    Hypothyroidism    Morbid obesity (HCC)    Overactive bladder    Sleep apnea    CPAP - not consistent   Thyroid cancer ([place])    Type 2 diabetes mellitus ([place])     Patient Active Problem List   Diagnosis Date Noted   Acute lower UTI 06/22/2019   Hypokalemia 06/20/2019   AKI (acute kidney injury) ([place]) 06/01/2019   NASH (nonalcoholic steatohepatitis) 06/01/2019   Pressure injury of skin 05/31/2019   Candidiasis, intertrigo 05/31/2019   Yeast infection 05/31/2019   Fever 12/24/2018   Hyponatremia 12/24/2018   Abnormal liver function 12/24/2018   Sepsis due to undetermined organism ([place]) 11/07/2018   Atrial fibrillation ([place]) 11/07/2018   COPD (chronic obstructive pulmonary disease) ([place]) 11/07/2018   GERD (gastroesophageal reflux disease) 11/07/2018   Hypothyroidism 11/07/2018   Sleep apnea 11/07/2018   Hepatic cirrhosis ([place]) 09/01/2014   Cirrhosis, nonalcoholic ([place]) 51/76/1607   Incisional hernia 06/09/2014   S/P complete repair of rotator cuff 07/24/2012   Precordial pain 09/30/2010   Essential hypertension, benign 09/30/2010   Type 2 diabetes mellitus ([place]) 09/05/2008   OBESITY-MORBID  (>100') 09/05/2008   ESOPHAGEAL REFLUX 09/05/2008   EDEMA 09/05/2008    Past Surgical History:  Procedure Laterality Date   "pump bumps"     bilateral heels--2000   ABDOMINAL HYSTERECTOMY  1985   CARDIAC CATHETERIZATION  2012   Dr. Lia Foyer told her nothing was wrong.   [place]   COLONOSCOPY WITH PROPOFOL N/A 04/17/2015   Procedure: COLONOSCOPY WITH PROPOFOL  at cecum at 0810; withdrawal time =15 minutes;  Surgeon: [name], MD;  Location: [facility];  Service: Endoscopy;  Laterality: N/A;   Debridement of abdominal wound  2011   ESOPHAGEAL DILATION N/A 04/17/2015   Procedure: ESOPHAGEAL DILATION WITH 56FR MALONEY DILATOR;  Surgeon: [name], MD;  Location: [facility];  Service: Endoscopy;  Laterality: N/A;   ESOPHAGOGASTRODUODENOSCOPY (EGD) WITH PROPOFOL N/A 04/17/2015   Procedure: ESOPHAGOGASTRODUODENOSCOPY (EGD) WITH PROPOFOL;  Surgeon: [name], MD;  Location: [facility];  Service: Endoscopy;  Laterality: N/A;   EXPLORATORY LAPAROTOMY  2003   FOOT SURGERY Bilateral 2000   Bone spur removed   FOOT SURGERY     INCISIONAL HERNIA REPAIR N/A 06/09/2014   Procedure: RECURRENT  INCISIONAL HERNIORRHAPHY WITH MESH;  Surgeon: [name], MD;  Location: [facility];  Service: General;  Laterality: N/A;   INCISIONAL HERNIA REPAIR N/A 10/15/2014   Procedure: Bath;  Surgeon: [name], MD;  Location: [place];  Service: General;  Laterality: N/A;   INSERTION OF MESH N/A 06/09/2014   Procedure: INSERTION OF MESH;  Surgeon: [name], MD;  Location: [facility];  Service: General;  Laterality: N/A;   INSERTION OF MESH N/A 10/15/2014   Procedure: INSERTION OF MESH;  Surgeon: [name], MD;  Location: [place];  Service: General;  Laterality: N/A;   POLYPECTOMY  04/17/2015   Procedure: POLYPECTOMY;  Surgeon: [name], MD;  Location: [facility];  Service: Endoscopy;;   RESECTION DISTAL  CLAVICAL  05/07/2012   Procedure: RESECTION DISTAL CLAVICAL;  Surgeon: [name], MD;  Location: [facility];  Service: Orthopedics;  Laterality: Right;   SHOULDER OPEN ROTATOR CUFF REPAIR  05/07/2012   Procedure: ROTATOR CUFF REPAIR SHOULDER OPEN;  Surgeon: [name], MD;  Location: [facility];  Service: Orthopedics;  Laterality: Right;   TEE WITHOUT CARDIOVERSION N/A 12/26/2018   Procedure: TRANSESOPHAGEAL ECHOCARDIOGRAM (TEE) WITH PROPOFOL;  Surgeon: Arnoldo Lenis, MD;  [facility];  Service: Endoscopy;  Laterality: N/A;   THYROIDECTOMY  2009   TONSILLECTOMY  7846   UMBILICAL HERNIA REPAIR  2010     OB History   No obstetric history on file.     Family History  Problem Relation Age of Onset   Heart disease Other    Arthritis Other    Cancer Other    Asthma Other    Diabetes Other    Kidney disease Other     Social History   Tobacco Use   Smoking status: Never Smoker   Smokeless tobacco: Never Used  Vaping Use   Vaping Use: Never used  Substance Use Topics   Alcohol use: No    Alcohol/week: 0.0 standard drinks   Drug use: No    Home Medications Prior to Admission medications   Medication Sig Start Date End Date Taking? Authorizing Provider  acetaminophen (TYLENOL) 325 MG tablet Take 2 tablets (650 mg total) by mouth every 6 (six) hours as needed for mild pain (or Fever >/= 101). 06/26/19   Roxan Hockey, MD  albuterol (PROVENTIL HFA) 108 (90 BASE) MCG/ACT inhaler Inhale 2 puffs into the lungs every 6 (six) hours as needed for wheezing or shortness of breath. Shortness of breath    [provider]  colesevelam (WELCHOL) 625 MG tablet Take 1,875 mg by mouth as directed. 3 tabs am 3 tabs pm    [provider]  diltiazem (TIAZAC) 360 MG 24 hr capsule TAKE ONE CAPSULE (360MG TOTAL) BY MOUTH DAILY Patient taking differently: Take 360 mg by mouth daily.  05/20/19   Strader, Fransisco Hertz, PA-C  DULoxetine (CYMBALTA) 60  MG capsule Take 120 mg by mouth daily.  03/20/15   [provider]  econazole nitrate 1 % cream Apply topically daily. Patient taking differently: Apply 1 application topically daily as needed (for rash irritation).  12/09/15   Tuchman, Richard C, DPM  ELIQUIS 5 MG TABS tablet TAKE (1) TABLET BY MOUTH TWICE DAILY. 01/21/20   Satira Sark, MD  fluconazole (DIFLUCAN) 150 MG tablet Take 1 tablet (150 mg total) by mouth daily for 14 doses. 04/12/20 04/26/20  Noemi Chapel, MD  gabapentin (NEURONTIN) 300 MG capsule Take 300 mg by mouth daily. 10/19/18   [provider]  HYDROcodone-acetaminophen (NORCO/VICODIN) 5-325 MG tablet Take 1 tablet by mouth every 6 (six) hours as needed for moderate pain or severe pain. 06/26/19   Emokpae, Courage,  MD  hydrOXYzine (ATARAX/VISTARIL) 25 MG tablet Take 25 mg by mouth 4 (four) times daily as needed for itching.  10/12/18   [provider]  insulin aspart (NOVOLOG) 100 UNIT/ML injection insulin aspart (novoLOG) injection 0-9 Units 0-9 Units Subcutaneous, 3 times daily with meals CBG < 70: Implement Hypoglycemia Standing Orders and refer to Hypoglycemia Standing Orders sidebar report  CBG 70 - 120: 0 units  CBG 121 - 150: 0 unit CBG 151 - 200: 2 units CBG 201 - 250: 3 units CBG 251 - 300: 5 units CBG 301 - 350: 7 units  CBG 351 - 400: 9 units CBG > 400: 10 units 06/26/19 06/25/20  Emokpae, Courage, MD  LANTUS SOLOSTAR 100 UNIT/ML Solostar Pen Inject 33 Units into the skin at bedtime.  09/21/18   [provider]  levothyroxine (SYNTHROID) 200 MCG tablet Take 200 mcg by mouth daily before breakfast. Take in addition to 75 mcg for a total of 275 mcg daily    [provider]  levothyroxine (SYNTHROID) 75 MCG tablet Take 1 tablet (75 mcg total) by mouth daily before breakfast. 06/02/19   Johnson, Clanford L, MD  magnesium oxide (MAG-OX) 400 (241.3 Mg) MG tablet Take 1 tablet (400 mg total) by mouth daily. 06/28/19   Lucky Cowboy, MD    methocarbamol (ROBAXIN) 500 MG tablet Take 1 tablet (500 mg total) by mouth every 6 (six) hours as needed for muscle spasms. 12/26/18   Manuella Ghazi, Pratik D, DO  nystatin cream (MYCOSTATIN) Apply to affected area 2 times daily 04/12/20   Noemi Chapel, MD  potassium chloride SA (K-DUR,KLOR-CON) 20 MEQ tablet Take 20 mEq by mouth 2 (two) times daily.  04/27/18   [provider]  pravastatin (PRAVACHOL) 20 MG tablet Take 20 mg by mouth daily. 10/30/18   [provider]  senna-docusate (SENOKOT-S) 8.6-50 MG tablet Take 2 tablets by mouth at bedtime. 06/26/19 06/25/20  Roxan Hockey, MD  tolterodine (DETROL LA) 4 MG 24 hr capsule Take 4 mg by mouth 2 (two) times a day. 08/17/18   [provider]  traZODone (DESYREL) 150 MG tablet Take 1 tablet (150 mg total) by mouth at bedtime. 06/26/19   Roxan Hockey, MD    Allergies    Doxycycline, Adhesive [tape], Biaxin [clarithromycin], Percocet [oxycodone-acetaminophen], Xarelto [rivaroxaban], Clarithromycin, Clindamycin/lincomycin, Neosporin [neomycin-polymyxin b gu], and Penicillins  Review of Systems   Review of Systems  Neurological: Positive for weakness.  All other systems reviewed and are negative.   Physical Exam Updated Vital Signs BP 133/67    Pulse 87    Temp 97.6 F (36.4 C) (Oral)    Resp 11    SpO2 94%   Physical Exam Vitals and nursing note reviewed.  Constitutional:      Appearance: Normal appearance. She is obese.  HENT:     Head: Normocephalic and atraumatic.     Right Ear: External ear normal.     Left Ear: External ear normal.     Nose: Nose normal.     Mouth/Throat:     Mouth: Mucous membranes are moist.     Pharynx: Oropharynx is clear.  Eyes:     Extraocular Movements: Extraocular movements intact.     Conjunctiva/sclera: Conjunctivae normal.     Pupils: Pupils are equal, round, and reactive to light.  Cardiovascular:     Rate and Rhythm: Tachycardia present. Rhythm irregular.     Pulses: Normal  pulses.     Heart sounds: Normal heart sounds.  Pulmonary:     Effort: Pulmonary effort is normal.     Breath sounds: Normal breath sounds.  Abdominal:     General: Abdomen is flat. Bowel sounds are normal.     Palpations: Abdomen is soft.  Musculoskeletal:     Cervical back: Normal range of motion and neck supple.     Right lower leg: Edema present.     Left lower leg: Edema present.  Skin:    General: Skin is warm.     Capillary Refill: Capillary refill takes less than 2 seconds.     Comments: Intertrigo noted  Neurological:     General: No focal deficit present.     Mental Status: She is alert and oriented to person, place, and time.     Comments: Pt is diffusely weak, but is able to move all 4 extremities.  She will wake up to speak, but will go back to sleep.  Psychiatric:        Mood and Affect: Mood normal.        Behavior: Behavior normal.     ED Results / Procedures / Treatments   Labs (all labs ordered are listed, but only abnormal results are displayed) Labs Reviewed  CBC WITH DIFFERENTIAL/PLATELET - Abnormal; Notable for the following components:      Result Value   Hemoglobin 9.0 (*)    HCT 31.1 (*)    MCV 76.4 (*)    MCH 22.1 (*)    MCHC 28.9 (*)    RDW 17.0 (*)    All other components within normal limits  COMPREHENSIVE METABOLIC PANEL - Abnormal; Notable for the following components:   Potassium 3.1 (*)    Glucose, Bld 144 (*)    Calcium 8.5 (*)    Albumin 2.7 (*)    Alkaline Phosphatase 131 (*)    All other components within normal limits  AMMONIA - Abnormal; Notable for the following components:   Ammonia <9 (*)    All other components within normal limits  BLOOD GAS, ARTERIAL - Abnormal; Notable for the following components:   pH, Arterial 7.461 (*)    pO2, Arterial 109 (*)    Allens test (pass/fail) NOT INDICATED (*)    All other components within normal limits  LACTIC ACID, PLASMA  URINALYSIS, ROUTINE W REFLEX MICROSCOPIC  TSH  CBG  MONITORING, ED    EKG EKG Interpretation  Date/Time:  Sunday April 12 2020 21:33:11 EDT Ventricular Rate:  109 PR Interval:    QRS Duration: 92 QT Interval:  335 QTC Calculation: 452 R Axis:   67 Text Interpretation: Sinus tachycardia with irregular rate Borderline T abnormalities, diffuse leads Confirmed by Isla Pence [phone]) on 04/13/2020 12:01:50 AM   Radiology DG Chest 2 View  Result Date: 04/12/2020 CLINICAL DATA:  Syncope. EXAM: CHEST - 2 VIEW COMPARISON:  April 12, 2020 (8:03 a.m.) FINDINGS: The cardiac silhouette is borderline in size and unchanged in appearance when compared to the prior study. Both lungs are clear. Surgical clips are seen overlying the soft tissues of the lower neck. The visualized skeletal structures are unremarkable. IMPRESSION: No active cardiopulmonary disease. Electronically Signed   By: Virgina Norfolk M.D.   On: 04/12/2020 22:35   CT Head Wo Contrast  Result Date: 04/12/2020 CLINICAL DATA:  Syncope. EXAM: CT HEAD WITHOUT CONTRAST TECHNIQUE: Contiguous axial images were obtained from the base of the skull through the vertex without intravenous contrast. COMPARISON:  May 30, 2019 FINDINGS: Brain: No evidence of acute infarction,  hemorrhage, hydrocephalus, extra-axial collection or mass lesion/mass effect. Vascular: No hyperdense vessel or unexpected calcification. Skull: Normal. Negative for fracture or focal lesion. Sinuses/Orbits: No acute finding. Other: None. IMPRESSION: No acute intracranial pathology. Electronically Signed   By: Virgina Norfolk M.D.   On: 04/12/2020 22:34   DG Chest Port 1 View  Result Date: 04/12/2020 CLINICAL DATA:  Weakness, rash. EXAM: PORTABLE CHEST 1 VIEW COMPARISON:  Chest x-ray dated 06/20/2019 FINDINGS: Heart size and mediastinal contours are within normal limits, stable. Lungs are clear. No pleural effusion. Osseous structures about the chest are unremarkable. IMPRESSION: No active disease.  No evidence  of pneumonia or pulmonary edema. Electronically Signed   By: Franki Cabot M.D.   On: 04/12/2020 08:29    Procedures Procedures (including critical care time)  Medications Ordered in ED Medications  acetaminophen (TYLENOL) tablet 650 mg (has no administration in time range)  albuterol (VENTOLIN HFA) 108 (90 Base) MCG/ACT inhaler 2 puff (has no administration in time range)  diltiazem (TIAZAC) 24 hr capsule 360 mg (has no administration in time range)  DULoxetine (CYMBALTA) DR capsule 120 mg (has no administration in time range)  econazole nitrate 1 % cream 1 application (has no administration in time range)  apixaban (ELIQUIS) tablet 5 mg (has no administration in time range)  fluconazole (DIFLUCAN) tablet 150 mg (has no administration in time range)  gabapentin (NEURONTIN) capsule 300 mg (has no administration in time range)  insulin glargine (LANTUS) Solostar Pen 33 Units (has no administration in time range)  levothyroxine (SYNTHROID) tablet 200 mcg (has no administration in time range)  levothyroxine (SYNTHROID) tablet 75 mcg (has no administration in time range)  magnesium oxide (MAG-OX) tablet 400 mg (has no administration in time range)  potassium chloride SA (KLOR-CON) CR tablet 20 mEq (has no administration in time range)  pravastatin (PRAVACHOL) tablet 20 mg (has no administration in time range)  senna-docusate (Senokot-S) tablet 2 tablet (has no administration in time range)  fesoterodine (TOVIAZ) tablet 8 mg (has no administration in time range)    ED Course  I have reviewed the triage vital signs and the nursing notes.  Pertinent labs & imaging results that were available during my care of the patient were reviewed by me and considered in my medical decision making (see chart for details).    MDM Rules/Calculators/A&P                          Pt has not been compliant with her meds.  She does not appear to be able to care for herself.  SW consult placed for possible  placement in an assisted living facility.  Pt is on several sedating meds.  I am going to hold them.   Final Clinical Impression(s) / ED Diagnoses Final diagnoses:  Weakness  Intertrigo    Rx / DC Orders ED Discharge Orders    None       Isla Pence, MD 04/13/20 [phone]

## 2020-04-12 NOTE — Discharge Instructions (Addendum)
Please see your doctor at the appointment this week, Use the topical Nystatin ointment in the areas with the rash around your abdomen and your legs / groin. Please take Diflucan daily for the next 2 weeks - this will need to be followed closely by your doctor Please follow-up with the heart doctor about your irregular heartbeat.  If you do feel increasing speed of your heart, palpitations or chest pain return to the emergency department   ER for worsening symptoms.

## 2020-04-12 NOTE — ED Notes (Signed)
Awaiting re-eval and dispo

## 2020-04-12 NOTE — ED Notes (Signed)
Call to [facility]   POC: [place]   Discussed whether or not this was appropriate for [facility], given pt home as reported, her hygeine, has mental status   She will discuss with her supervisor and proceed if needed

## 2020-04-12 NOTE — ED Notes (Signed)
Call to [facility]   When asked who help care for pt   She replied her cat

## 2020-04-12 NOTE — ED Notes (Signed)
Poor hygienes, complete bath done.

## 2020-04-13 DIAGNOSIS — L304 Erythema intertrigo: Secondary | ICD-10-CM | POA: Diagnosis not present

## 2020-04-13 DIAGNOSIS — I10 Essential (primary) hypertension: Secondary | ICD-10-CM | POA: Diagnosis not present

## 2020-04-13 DIAGNOSIS — R531 Weakness: Secondary | ICD-10-CM | POA: Diagnosis not present

## 2020-04-13 LAB — URINE CULTURE: Culture: NO GROWTH

## 2020-04-13 LAB — URINALYSIS, ROUTINE W REFLEX MICROSCOPIC
Bilirubin Urine: NEGATIVE
Glucose, UA: NEGATIVE mg/dL
Hgb urine dipstick: NEGATIVE
Ketones, ur: 5 mg/dL — AB
Leukocytes,Ua: NEGATIVE
Nitrite: NEGATIVE
Protein, ur: NEGATIVE mg/dL
Specific Gravity, Urine: 1.012 (ref 1.005–1.030)
pH: 6 (ref 5.0–8.0)

## 2020-04-13 LAB — TSH: TSH: 4.137 u[IU]/mL (ref 0.350–4.500)

## 2020-04-13 LAB — RESPIRATORY PANEL BY RT PCR (FLU A&B, COVID)
Influenza A by PCR: NEGATIVE
Influenza B by PCR: NEGATIVE
SARS Coronavirus 2 by RT PCR: NEGATIVE

## 2020-04-13 LAB — CBG MONITORING, ED: Glucose-Capillary: 123 mg/dL — ABNORMAL HIGH (ref 70–99)

## 2020-04-13 MED ORDER — LEVOTHYROXINE SODIUM 50 MCG PO TABS
200.0000 ug | ORAL_TABLET | Freq: Every day | ORAL | Status: DC
  Administered 2020-04-13 – 2020-04-14 (×2): 200 ug via ORAL
  Filled 2020-04-13 (×2): qty 4

## 2020-04-13 MED ORDER — GABAPENTIN 300 MG PO CAPS
300.0000 mg | ORAL_CAPSULE | Freq: Every day | ORAL | Status: DC
  Administered 2020-04-13 – 2020-04-14 (×2): 300 mg via ORAL
  Filled 2020-04-13: qty 1

## 2020-04-13 MED ORDER — LEVOTHYROXINE SODIUM 50 MCG PO TABS
75.0000 ug | ORAL_TABLET | Freq: Every day | ORAL | Status: DC
  Administered 2020-04-13 – 2020-04-14 (×2): 75 ug via ORAL
  Filled 2020-04-13 (×2): qty 2

## 2020-04-13 MED ORDER — ALBUTEROL SULFATE HFA 108 (90 BASE) MCG/ACT IN AERS: 2.0000 | INHALATION_SPRAY | Freq: Four times a day (QID) | RESPIRATORY_TRACT | Status: DC | PRN

## 2020-04-13 MED ORDER — FLUCONAZOLE 150 MG PO TABS
150.0000 mg | ORAL_TABLET | Freq: Every day | ORAL | Status: DC
  Administered 2020-04-13 – 2020-04-14 (×2): 150 mg via ORAL
  Filled 2020-04-13 (×2): qty 1

## 2020-04-13 MED ORDER — PRAVASTATIN SODIUM 10 MG PO TABS
20.0000 mg | ORAL_TABLET | Freq: Every day | ORAL | Status: DC
  Administered 2020-04-13 – 2020-04-14 (×2): 20 mg via ORAL
  Filled 2020-04-13 (×3): qty 1

## 2020-04-13 MED ORDER — ACETAMINOPHEN 325 MG PO TABS: 650.0000 mg | ORAL_TABLET | Freq: Four times a day (QID) | ORAL | Status: DC | PRN

## 2020-04-13 MED ORDER — ONDANSETRON 4 MG PO TBDP
4.0000 mg | ORAL_TABLET | Freq: Once | ORAL | Status: AC
  Administered 2020-04-13: 4 mg via ORAL
  Filled 2020-04-13: qty 1

## 2020-04-13 MED ORDER — MAGNESIUM OXIDE 400 (241.3 MG) MG PO TABS
400.0000 mg | ORAL_TABLET | Freq: Every day | ORAL | Status: DC
  Administered 2020-04-13 – 2020-04-14 (×2): 400 mg via ORAL
  Filled 2020-04-13 (×2): qty 1

## 2020-04-13 MED ORDER — ECONAZOLE NITRATE 1 % EX CREA: 1.0000 "application " | TOPICAL_CREAM | Freq: Every day | CUTANEOUS | Status: DC | PRN

## 2020-04-13 MED ORDER — APIXABAN 5 MG PO TABS
5.0000 mg | ORAL_TABLET | Freq: Two times a day (BID) | ORAL | Status: DC
  Administered 2020-04-13 – 2020-04-14 (×4): 5 mg via ORAL
  Filled 2020-04-13 (×4): qty 1

## 2020-04-13 MED ORDER — POTASSIUM CHLORIDE CRYS ER 20 MEQ PO TBCR
20.0000 meq | EXTENDED_RELEASE_TABLET | Freq: Two times a day (BID) | ORAL | Status: DC
  Administered 2020-04-13 – 2020-04-14 (×4): 20 meq via ORAL
  Filled 2020-04-13 (×4): qty 1

## 2020-04-13 MED ORDER — DILTIAZEM HCL ER BEADS 240 MG PO CP24
360.0000 mg | ORAL_CAPSULE | Freq: Every day | ORAL | Status: DC
  Administered 2020-04-13 – 2020-04-14 (×2): 360 mg via ORAL
  Filled 2020-04-13 (×4): qty 1

## 2020-04-13 MED ORDER — INSULIN GLARGINE 100 UNIT/ML ~~LOC~~ SOLN
33.0000 [IU] | Freq: Every day | SUBCUTANEOUS | Status: DC
  Administered 2020-04-13: 33 [IU] via SUBCUTANEOUS
  Filled 2020-04-13 (×3): qty 0.33

## 2020-04-13 MED ORDER — SENNOSIDES-DOCUSATE SODIUM 8.6-50 MG PO TABS
2.0000 | ORAL_TABLET | Freq: Every day | ORAL | Status: DC
  Administered 2020-04-13: 2 via ORAL
  Filled 2020-04-13 (×3): qty 2

## 2020-04-13 MED ORDER — FESOTERODINE FUMARATE ER 4 MG PO TB24
8.0000 mg | ORAL_TABLET | Freq: Every day | ORAL | Status: DC
  Administered 2020-04-13 – 2020-04-14 (×2): 8 mg via ORAL
  Filled 2020-04-13 (×3): qty 1

## 2020-04-13 MED ORDER — DULOXETINE HCL 30 MG PO CPEP
120.0000 mg | ORAL_CAPSULE | Freq: Every day | ORAL | Status: DC
  Administered 2020-04-13 – 2020-04-14 (×2): 120 mg via ORAL
  Filled 2020-04-13 (×2): qty 4

## 2020-04-13 NOTE — TOC Initial Note (Signed)
Transition of Care [facility]) - Initial/Assessment Note   Patient Details  Name: [name] [name] MRN: [id number] Date of Birth: 1955/08/11  [facility] CM/SW Contact:    Sherie Don, LCSW Phone Number: 04/13/2020, 9:40 AM  Clinical Narrative: Patient is a 64 year old female who presented to the ED for weakness. TOC received consult for SNF. PT evaluation recommended SNF. CSW spoke with patient and patient is agreeable to SNF and requested [facility] as her first choice. FL2 completed and faxed out.  CSW updated by Ebony Hail with [facility] that a bed offer will be made pending HTA approving patient for SNF and requested COVID vaccine update.  CSW started HTA authorizations for SNF and EMS transport with [place] spoke with patient about COVID vaccine. Per patient, she was vaccinated through [employer], but she does not have her COVID vaccine card with her. Patient provided CSW with contact information for Elana Walker with [employer]. CSW left HIPAA compliant VM with [facility] and requested call back. CSW awaiting HTA insurance authorization.  Expected Discharge Plan: Skilled Nursing Facility Barriers to Discharge: SNF Pending bed offer  Patient Goals and CMS Choice Patient states their goals for this hospitalization and ongoing recovery are:: Discharge to SNF for rehab CMS Medicare.gov Compare Post Acute Care list provided to:: Patient Choice offered to / list presented to : Patient  Expected Discharge Plan and Services Expected Discharge Plan: [place] In-house Referral: Clinical Social Work Discharge Planning Services: NA Post Acute Care Choice: [place] Living arrangements for the past 2 months: [place]  Prior Living Arrangements/Services Living arrangements for the past 2 months: Single Family Home Lives with:: Self Patient language and need for interpreter reviewed:: Yes Do you feel safe going back to the place where  you live?: Yes      Need for Family Participation in Patient Care: No (Comment) Care giver support system in place?: Yes (comment) Current home services: DME Criminal Activity/Legal Involvement Pertinent to Current Situation/Hospitalization: No - Comment as needed  Permission Sought/Granted Permission sought to share information with : Facility Art therapist granted to share information with : Yes, Verbal Permission Granted Permission granted to share info w AGENCY: SNFs  Emotional Assessment Appearance:: Appears stated age Attitude/Demeanor/Rapport: Engaged Affect (typically observed): Accepting Orientation: : Oriented to Self, Oriented to Place, Oriented to  Time, Oriented to Situation Alcohol / Substance Use: Not Applicable Psych Involvement: No (comment)  Admission diagnosis:  Generalized weakness  Patient Active Problem List   Diagnosis Date Noted  . Acute lower UTI 06/22/2019  . Hypokalemia 06/20/2019  . AKI (acute kidney injury) ([place]) 06/01/2019  . NASH (nonalcoholic steatohepatitis) 06/01/2019  . Pressure injury of skin 05/31/2019  . Candidiasis, intertrigo 05/31/2019  . Yeast infection 05/31/2019  . Fever 12/24/2018  . Hyponatremia 12/24/2018  . Abnormal liver function 12/24/2018  . Sepsis due to undetermined organism ([place]) 11/07/2018  . Atrial fibrillation ([place]) 11/07/2018  . COPD (chronic obstructive pulmonary disease) ([place]) 11/07/2018  . GERD (gastroesophageal reflux disease) 11/07/2018  . Hypothyroidism 11/07/2018  . Sleep apnea 11/07/2018  . Hepatic cirrhosis ([place]) 09/01/2014  . Cirrhosis, nonalcoholic ([place]) 55/73/2202  . Incisional hernia 06/09/2014  . S/P complete repair of rotator cuff 07/24/2012  . Precordial pain 09/30/2010  . Essential hypertension, benign 09/30/2010  . Type 2 diabetes mellitus ([place]) 09/05/2008  . OBESITY-MORBID (>100') 09/05/2008  . ESOPHAGEAL REFLUX 09/05/2008  . EDEMA 09/05/2008   PCP:  Redmond School,  MD Pharmacy:  [facility], [address] [place] [address] Phone: [phone] Fax: [place], [place] [address] [place] [address] Phone: [phone] Fax: [phone]  Readmission Risk Interventions Readmission Risk Prevention Plan 06/21/2019  Transportation Screening Not Complete  Medication Review Press photographer) Complete  PCP or Specialist appointment within 3-5 days of discharge Not Complete  HRI or Home Care Consult Complete  SW Recovery Care/Counseling Consult Complete  Palliative Care Screening Not Complete  Skilled Nursing Facility Complete  Some recent data might be hidden

## 2020-04-13 NOTE — NC FL2 (Signed)
[place] MEDICAID FL2 LEVEL OF CARE SCREENING TOOL     IDENTIFICATION  Patient Name: [name] [name] Birthdate: Aug 02, 1955 Sex: female Admission Date (Current Location): 04/12/2020  [facility] and [place] Number:  [employer] and Address:  [facility] [address] [place]      Provider Number: [id number]  Attending Physician Name and Address:  No att. providers found  Relative Name and Phone Number:  Purvis Kilts (friend) Ph: [phone]    Current Level of Care: Hospital Recommended Level of Care: [place] Prior Approval Number:    Date Approved/Denied:   PASRR Number: [id number]  Discharge Plan: SNF    Current Diagnoses: Patient Active Problem List   Diagnosis Date Noted  . Acute lower UTI 06/22/2019  . Hypokalemia 06/20/2019  . AKI (acute kidney injury) ([place]) 06/01/2019  . NASH (nonalcoholic steatohepatitis) 06/01/2019  . Pressure injury of skin 05/31/2019  . Candidiasis, intertrigo 05/31/2019  . Yeast infection 05/31/2019  . Fever 12/24/2018  . Hyponatremia 12/24/2018  . Abnormal liver function 12/24/2018  . Sepsis due to undetermined organism ([place]) 11/07/2018  . Atrial fibrillation ([place]) 11/07/2018  . COPD (chronic obstructive pulmonary disease) ([place]) 11/07/2018  . GERD (gastroesophageal reflux disease) 11/07/2018  . Hypothyroidism 11/07/2018  . Sleep apnea 11/07/2018  . Hepatic cirrhosis ([place]) 09/01/2014  . Cirrhosis, nonalcoholic ([place]) 61/95/0932  . Incisional hernia 06/09/2014  . S/P complete repair of rotator cuff 07/24/2012  . Precordial pain 09/30/2010  . Essential hypertension, benign 09/30/2010  . Type 2 diabetes mellitus ([place]) 09/05/2008  . OBESITY-MORBID (>100') 09/05/2008  . ESOPHAGEAL REFLUX 09/05/2008  . EDEMA 09/05/2008    Orientation RESPIRATION BLADDER Height & Weight     Self, Time, Situation, Place  Normal External catheter, Continent Weight:   Height:     BEHAVIORAL  SYMPTOMS/MOOD NEUROLOGICAL BOWEL NUTRITION STATUS      Continent Diet (See discharge summary)  AMBULATORY STATUS COMMUNICATION OF NEEDS Skin   Extensive Assist Verbally Other (Comment) (Patient has rash on buttocks)                       Personal Care Assistance Level of Assistance  Bathing, Feeding, Dressing Bathing Assistance: Limited assistance Feeding assistance: Independent Dressing Assistance: Limited assistance     Functional Limitations Info  Sight, Hearing, Speech Sight Info: Adequate Hearing Info: Adequate Speech Info: Adequate    SPECIAL CARE FACTORS FREQUENCY  PT (By licensed PT)     PT Frequency: 5x's/week              Contractures Contractures Info: Not present    Additional Factors Info  Allergies, Psychotropic   Allergies Info: Doxycycline; Adhesive (Tape); Biaxin (Clarithromycin); Percocet (Oxycodone-acetaminophen); Xarelto (Rivaroxaban); Clindamycin/lincomycin; Neosporin (Neomycin-polymyxin B Gu); Penicillins Psychotropic Info: Neurontin (gabapentin)         Current Medications (04/13/2020):  This is the current hospital active medication list Current Facility-Administered Medications  Medication Dose Route Frequency Provider Last Rate Last Admin  . acetaminophen (TYLENOL) tablet 650 mg  650 mg Oral Q6H PRN Isla Pence, MD      . albuterol (VENTOLIN HFA) 108 (90 Base) MCG/ACT inhaler 2 puff  2 puff Inhalation Q6H PRN Isla Pence, MD      . apixaban Arne Cleveland) tablet 5 mg  5 mg Oral BID Isla Pence, MD   5 mg at 04/13/20 0123  . diltiazem (TIAZAC) 24 hr capsule 360 mg  360 mg Oral Daily Isla Pence, MD      .  DULoxetine (CYMBALTA) DR capsule 120 mg  120 mg Oral Daily Isla Pence, MD      . econazole nitrate 1 % cream 1 application  1 application Topical Daily PRN Isla Pence, MD      . fesoterodine (TOVIAZ) tablet 8 mg  8 mg Oral Daily Isla Pence, MD      . fluconazole (DIFLUCAN) tablet 150 mg  150 mg Oral Daily  Isla Pence, MD      . gabapentin (NEURONTIN) capsule 300 mg  300 mg Oral Daily Isla Pence, MD      . insulin glargine (LANTUS) injection 33 Units  33 Units Subcutaneous QHS Isla Pence, MD      . levothyroxine (SYNTHROID) tablet 200 mcg  200 mcg Oral QAC breakfast Isla Pence, MD   200 mcg at 04/13/20 0846  . levothyroxine (SYNTHROID) tablet 75 mcg  75 mcg Oral QAC breakfast Isla Pence, MD   75 mcg at 04/13/20 0847  . magnesium oxide (MAG-OX) tablet 400 mg  400 mg Oral Daily Isla Pence, MD      . potassium chloride SA (KLOR-CON) CR tablet 20 mEq  20 mEq Oral BID Isla Pence, MD   20 mEq at 04/13/20 0123  . pravastatin (PRAVACHOL) tablet 20 mg  20 mg Oral Daily Isla Pence, MD      . senna-docusate (Senokot-S) tablet 2 tablet  2 tablet Oral QHS Isla Pence, MD       Current Outpatient Medications  Medication Sig Dispense Refill  . acetaminophen (TYLENOL) 325 MG tablet Take 2 tablets (650 mg total) by mouth every 6 (six) hours as needed for mild pain (or Fever >/= 101). 12 tablet 0  . albuterol (PROVENTIL HFA) 108 (90 BASE) MCG/ACT inhaler Inhale 2 puffs into the lungs every 6 (six) hours as needed for wheezing or shortness of breath. Shortness of breath    . colesevelam (WELCHOL) 625 MG tablet Take 1,875 mg by mouth as directed. 3 tabs am 3 tabs pm    . diltiazem (TIAZAC) 360 MG 24 hr capsule TAKE ONE CAPSULE (360MG TOTAL) BY MOUTH DAILY (Patient taking differently: Take 360 mg by mouth daily. ) 90 capsule 3  . DULoxetine (CYMBALTA) 60 MG capsule Take 120 mg by mouth daily.   4  . econazole nitrate 1 % cream Apply topically daily. (Patient taking differently: Apply 1 application topically daily as needed (for rash irritation). ) 85 g 0  . ELIQUIS 5 MG TABS tablet TAKE (1) TABLET BY MOUTH TWICE DAILY. 180 tablet 1  . fluconazole (DIFLUCAN) 150 MG tablet Take 1 tablet (150 mg total) by mouth daily for 14 doses. 14 tablet 0  . gabapentin (NEURONTIN) 300 MG  capsule Take 300 mg by mouth daily.    [name] HYDROcodone-acetaminophen (NORCO/VICODIN) 5-325 MG tablet Take 1 tablet by mouth every 6 (six) hours as needed for moderate pain or severe pain. 12 tablet 0  . hydrOXYzine (ATARAX/VISTARIL) 25 MG tablet Take 25 mg by mouth 4 (four) times daily as needed for itching.     . insulin aspart (NOVOLOG) 100 UNIT/ML injection insulin aspart (novoLOG) injection 0-9 Units 0-9 Units Subcutaneous, 3 times daily with meals CBG < 70: Implement Hypoglycemia Standing Orders and refer to Hypoglycemia Standing Orders sidebar report  CBG 70 - 120: 0 units  CBG 121 - 150: 0 unit CBG 151 - 200: 2 units CBG 201 - 250: 3 units CBG 251 - 300: 5 units CBG 301 - 350: 7 units  CBG 351 -  400: 9 units CBG > 400: 10 units 10 mL 3  . LANTUS SOLOSTAR 100 UNIT/ML Solostar Pen Inject 33 Units into the skin at bedtime.     [name] levothyroxine (SYNTHROID) 200 MCG tablet Take 200 mcg by mouth daily before breakfast. Take in addition to 75 mcg for a total of 275 mcg daily    . levothyroxine (SYNTHROID) 75 MCG tablet Take 1 tablet (75 mcg total) by mouth daily before breakfast.    . magnesium oxide (MAG-OX) 400 (241.3 Mg) MG tablet Take 1 tablet (400 mg total) by mouth daily. 30 tablet 0  . methocarbamol (ROBAXIN) 500 MG tablet Take 1 tablet (500 mg total) by mouth every 6 (six) hours as needed for muscle spasms. 20 tablet 0  . nystatin cream (MYCOSTATIN) Apply to affected area 2 times daily 30 g 5  . potassium chloride SA (K-DUR,KLOR-CON) 20 MEQ tablet Take 20 mEq by mouth 2 (two) times daily.     . pravastatin (PRAVACHOL) 20 MG tablet Take 20 mg by mouth daily.    [name] senna-docusate (SENOKOT-S) 8.6-50 MG tablet Take 2 tablets by mouth at bedtime. 60 tablet 1  . tolterodine (DETROL LA) 4 MG 24 hr capsule Take 4 mg by mouth 2 (two) times a day.    . traZODone (DESYREL) 150 MG tablet Take 1 tablet (150 mg total) by mouth at bedtime. 30 tablet 3     Discharge Medications: Please see discharge summary  for a list of discharge medications.  Relevant Imaging Results:  Relevant Lab Results:   Additional Information SSN: [id number]  Sherie Don, LCSW

## 2020-04-13 NOTE — ED Notes (Signed)
Gave pt sandwich, apple, and cup of ice

## 2020-04-13 NOTE — Evaluation (Signed)
Physical Therapy Evaluation Patient Details Name: [name] [name] MRN: [id number] DOB: Sep 30, 1955 Today's Date: 04/13/2020   History of Present Illness  This patient is a chronically ill 64 year old female, she has a known history of cirrhosis, atrial fibrillation, she has a history of diabetes, history of thyroid cancer and unfortunately history of morbid obesity which has caused her to have difficulty taking care of her self.  She currently lives alone with her cat, she is followed by a local family primary care physician.  She has struggled with a skin rash over time, this has been treated with topical medications, she cannot recall the name of them at this time and states that she was recently called in some medications which she has been trying to use but states they are not helping.  She had to use a wipe this morning to clean herself and felt like there was significant burning of the skin after using the wipe.  The patient also notes that she has fairly severe anxiety and continually picks at her skin and has multiple picking lesions across her body.  The patient was scheduled to see her doctor because of weakness and difficulty ambulating but because of her symptoms this morning elected to call 911 for a more urgent evaluation.  She reports that she has had a dry mouth, she is not eating well, she does take her medication as prescribed and states her blood sugar this morning was around 130.    Clinical Impression  Patient demonstrates slow labored movement for sitting up at bedside requiring Min assist to move BLE, unable to maintain standing balance without use of AD, and limited to a few steps at bedside using RW due to fatigue and weakness.  Patient will benefit from continued physical therapy in hospital and recommended venue below to increase strength, balance, endurance for safe ADLs and gait.     Follow Up Recommendations SNF    Equipment Recommendations  None recommended by PT     Recommendations for Other Services       Precautions / Restrictions Precautions Precautions: Fall Restrictions Weight Bearing Restrictions: No      Mobility  Bed Mobility Overal bed mobility: Needs Assistance Bed Mobility: Supine to Sit;Sit to Supine     Supine to sit: Min assist Sit to supine: Min assist   General bed mobility comments: increased time, labored movement    Transfers Overall transfer level: Needs assistance Equipment used: Rolling walker (2 wheeled) Transfers: Sit to/from Stand Sit to Stand: Min assist;Mod assist         General transfer comment: slow labored movement  Ambulation/Gait Ambulation/Gait assistance: Mod assist Gait Distance (Feet): 8 Feet Assistive device: Rolling walker (2 wheeled) Gait Pattern/deviations: Decreased step length - right;Decreased step length - left;Decreased stride length;Trunk flexed Gait velocity: decreased   General Gait Details: limited to 7-8 slow labored side steps at bedside due to c/o fatigue and weakness  Stairs            Wheelchair Mobility    Modified Rankin (Stroke Patients Only)       Balance Overall balance assessment: Needs assistance Sitting-balance support: No upper extremity supported;Feet supported Sitting balance-Leahy Scale: Fair Sitting balance - Comments: fair/good seated at bedside   Standing balance support: No upper extremity supported;During functional activity Standing balance-Leahy Scale: Poor Standing balance comment: fair/poor using RW  Pertinent Vitals/Pain Pain Assessment: No/denies pain    Home Living Family/patient expects to be discharged to:: Private residence Living Arrangements: Alone Available Help at Discharge: Friend(s);Neighbor;Available PRN/intermittently Type of Home: House Home Access: Ramped entrance     Home Layout: One level Home Equipment: Walker - 2 wheels;Walker - 4 wheels;Cane - single point;Cane -  quad;Bedside commode;Shower seat      Prior Function Level of Independence: Independent         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, 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     Hand Dominance        Extremity/Trunk Assessment   Upper Extremity Assessment Upper Extremity Assessment: Generalized weakness    Lower Extremity Assessment Lower Extremity Assessment: Generalized weakness    Cervical / Trunk Assessment Cervical / Trunk Assessment: Normal  Communication   Communication: No difficulties  Cognition Arousal/Alertness: Awake/alert Behavior During Therapy: WFL for tasks assessed/performed Overall Cognitive Status: Within Functional Limits for tasks assessed                                        General Comments      Exercises     Assessment/Plan    PT Assessment Patient needs continued PT services  PT Problem List Decreased strength;Decreased activity tolerance;Decreased balance;Decreased mobility;Decreased knowledge of use of DME       PT Treatment Interventions DME instruction;Gait training;Stair training;Functional mobility training;Therapeutic activities;Therapeutic exercise;Balance training;Patient/family education    PT Goals (Current goals can be found in the Care Plan section)  Acute Rehab PT Goals Patient Stated Goal: return home after rehab PT Goal Formulation: With patient Time For Goal Achievement: 04/27/20 Potential to Achieve Goals: Good    Frequency Min 3X/week   Barriers to discharge        Co-evaluation               AM-PAC PT "6 Clicks" Mobility  Outcome Measure Help needed turning from your back to your side while in a flat bed without using bedrails?: A Little Help needed moving from lying on your back to sitting on the side of a flat bed without using bedrails?: A Little Help needed moving to and from a bed to a chair (including a wheelchair)?: A Lot Help needed standing up from a  chair using your arms (e.g., wheelchair or bedside chair)?: A Lot Help needed to walk in hospital room?: A Lot Help needed climbing 3-5 steps with a railing? : Total 6 Click Score: 13    End of Session   Activity Tolerance: Patient tolerated treatment well;Patient limited by fatigue Patient left: in bed;with call bell/phone within reach Nurse Communication: Mobility status PT Visit Diagnosis: Unsteadiness on feet (R26.81);Other abnormalities of gait and mobility (R26.89);Muscle weakness (generalized) (M62.81)    Time: 3570-1779 PT Time Calculation (min) (ACUTE ONLY): 21 min   Charges:   PT Evaluation $PT Eval Moderate Complexity: 1 Mod PT Treatments $Therapeutic Activity: 23-37 mins        9:40 AM, 04/13/20 [name], MPT Physical Therapist with [facility] [phone] office [phone] mobile phone

## 2020-04-13 NOTE — TOC Progression Note (Signed)
Transition of Care [facility]) - Progression Note   Patient Details  Name: [name] [name] MRN: [id number] Date of Birth: 08/10/1955  Transition of Care [facility]) CM/SW [place], LCSW Phone Number: 04/13/2020, 4:29 PM  Clinical Narrative: CSW called Ebony Hail with [facility]. Per Ebony Hail, patient will not be able to come today but can discharge to the facility tomorrow. CSW received insurance authorization from [place] with [employer]. Auth # for SNF is: [id number]; auth # for EMS is: [phone]. EDP to order COVID test. TOC to follow.  Expected Discharge Plan: Skilled Nursing Facility Barriers to Discharge: SNF Pending bed offer  Expected Discharge Plan and Services Expected Discharge Plan: [place] In-house Referral: Clinical Social Work Discharge Planning Services: NA Post Acute Care Choice: [place] Living arrangements for the past 2 months: Single Family Home  Readmission Risk Interventions Readmission Risk Prevention Plan 06/21/2019  Transportation Screening Not Complete  Medication Review Press photographer) Complete  PCP or Specialist appointment within 3-5 days of discharge Not Complete  HRI or Home Care Consult Complete  SW Recovery Care/Counseling Consult Complete  Palliative Care Screening Not Complete  Skilled Nursing Facility Complete  Some recent data might be hidden

## 2020-04-13 NOTE — ED Notes (Signed)
Pt moved to hospital bed, no complaints at this time

## 2020-04-13 NOTE — Plan of Care (Signed)
  Problem: Acute Rehab PT Goals(only PT should resolve) Goal: Pt Will Go Supine/Side To Sit Outcome: Progressing Flowsheets (Taken 04/13/2020 0943) Pt will go Supine/Side to Sit: with min guard assist Goal: Patient Will Transfer Sit To/From Stand Outcome: Progressing Flowsheets (Taken 04/13/2020 0943) Patient will transfer sit to/from stand:  with min guard assist  with minimal assist Goal: Pt Will Transfer Bed To Chair/Chair To Bed Outcome: Progressing Flowsheets (Taken 04/13/2020 0943) Pt will Transfer Bed to Chair/Chair to Bed:  with min assist  with mod assist Goal: Pt Will Ambulate Outcome: Progressing Flowsheets (Taken 04/13/2020 0943) Pt will Ambulate:  50 feet  with min guard assist  with minimal assist  with rolling walker   9:44 AM, 04/13/20 [name], MPT Physical Therapist with [facility] [phone] office [phone] mobile phone

## 2020-04-13 NOTE — ED Notes (Signed)
Called pharmacy to have someone bring the diltiazem 167m

## 2020-04-14 DIAGNOSIS — R2689 Other abnormalities of gait and mobility: Secondary | ICD-10-CM | POA: Diagnosis not present

## 2020-04-14 DIAGNOSIS — I872 Venous insufficiency (chronic) (peripheral): Secondary | ICD-10-CM | POA: Diagnosis not present

## 2020-04-14 DIAGNOSIS — J449 Chronic obstructive pulmonary disease, unspecified: Secondary | ICD-10-CM | POA: Diagnosis not present

## 2020-04-14 DIAGNOSIS — M1990 Unspecified osteoarthritis, unspecified site: Secondary | ICD-10-CM | POA: Diagnosis not present

## 2020-04-14 DIAGNOSIS — E1169 Type 2 diabetes mellitus with other specified complication: Secondary | ICD-10-CM | POA: Diagnosis not present

## 2020-04-14 DIAGNOSIS — B372 Candidiasis of skin and nail: Secondary | ICD-10-CM | POA: Diagnosis not present

## 2020-04-14 DIAGNOSIS — E1140 Type 2 diabetes mellitus with diabetic neuropathy, unspecified: Secondary | ICD-10-CM | POA: Diagnosis not present

## 2020-04-14 DIAGNOSIS — R5381 Other malaise: Secondary | ICD-10-CM | POA: Diagnosis not present

## 2020-04-14 DIAGNOSIS — E119 Type 2 diabetes mellitus without complications: Secondary | ICD-10-CM | POA: Diagnosis not present

## 2020-04-14 DIAGNOSIS — E063 Autoimmune thyroiditis: Secondary | ICD-10-CM | POA: Diagnosis not present

## 2020-04-14 DIAGNOSIS — F419 Anxiety disorder, unspecified: Secondary | ICD-10-CM | POA: Diagnosis not present

## 2020-04-14 DIAGNOSIS — K7581 Nonalcoholic steatohepatitis (NASH): Secondary | ICD-10-CM | POA: Diagnosis not present

## 2020-04-14 DIAGNOSIS — I2510 Atherosclerotic heart disease of native coronary artery without angina pectoris: Secondary | ICD-10-CM | POA: Diagnosis not present

## 2020-04-14 DIAGNOSIS — J45909 Unspecified asthma, uncomplicated: Secondary | ICD-10-CM | POA: Diagnosis not present

## 2020-04-14 DIAGNOSIS — K219 Gastro-esophageal reflux disease without esophagitis: Secondary | ICD-10-CM | POA: Diagnosis not present

## 2020-04-14 DIAGNOSIS — I10 Essential (primary) hypertension: Secondary | ICD-10-CM | POA: Diagnosis not present

## 2020-04-14 DIAGNOSIS — E559 Vitamin D deficiency, unspecified: Secondary | ICD-10-CM | POA: Diagnosis not present

## 2020-04-14 DIAGNOSIS — K7460 Unspecified cirrhosis of liver: Secondary | ICD-10-CM | POA: Diagnosis not present

## 2020-04-14 DIAGNOSIS — M6281 Muscle weakness (generalized): Secondary | ICD-10-CM | POA: Diagnosis not present

## 2020-04-14 DIAGNOSIS — R279 Unspecified lack of coordination: Secondary | ICD-10-CM | POA: Diagnosis not present

## 2020-04-14 DIAGNOSIS — L603 Nail dystrophy: Secondary | ICD-10-CM | POA: Diagnosis not present

## 2020-04-14 DIAGNOSIS — F339 Major depressive disorder, recurrent, unspecified: Secondary | ICD-10-CM | POA: Diagnosis not present

## 2020-04-14 DIAGNOSIS — Z7401 Bed confinement status: Secondary | ICD-10-CM | POA: Diagnosis not present

## 2020-04-14 DIAGNOSIS — M2042 Other hammer toe(s) (acquired), left foot: Secondary | ICD-10-CM | POA: Diagnosis not present

## 2020-04-14 DIAGNOSIS — D649 Anemia, unspecified: Secondary | ICD-10-CM | POA: Diagnosis not present

## 2020-04-14 DIAGNOSIS — E1159 Type 2 diabetes mellitus with other circulatory complications: Secondary | ICD-10-CM | POA: Diagnosis not present

## 2020-04-14 DIAGNOSIS — E785 Hyperlipidemia, unspecified: Secondary | ICD-10-CM | POA: Diagnosis not present

## 2020-04-14 DIAGNOSIS — M170 Bilateral primary osteoarthritis of knee: Secondary | ICD-10-CM | POA: Diagnosis not present

## 2020-04-14 DIAGNOSIS — E039 Hypothyroidism, unspecified: Secondary | ICD-10-CM | POA: Diagnosis not present

## 2020-04-14 DIAGNOSIS — L304 Erythema intertrigo: Secondary | ICD-10-CM | POA: Diagnosis not present

## 2020-04-14 DIAGNOSIS — M2041 Other hammer toe(s) (acquired), right foot: Secondary | ICD-10-CM | POA: Diagnosis not present

## 2020-04-14 NOTE — ED Notes (Signed)
Called EMS [facility]

## 2020-04-14 NOTE — ED Notes (Signed)
Called EMS [facility] to schedule transport to [facility] in [place].  To be there at 1430.

## 2020-04-14 NOTE — ED Notes (Signed)
Report given to [facility].  Pt moved to stretcher to transport to [facility].

## 2020-04-14 NOTE — ED Provider Notes (Addendum)
Discharge Summary  This patient is a chronically ill 64 year old female. She has a known history of cirrhosis, atrial fibrillation, history of diabetes, history of thyroid cancer and unfortunately history of morbid obesity which has caused her to have difficulty taking care of her self.  She currently lives alone with her cat.    She presented to the [facility] ED on 04/12/20 complaining of dry mouth, skin rash, poor appetite, and failure to thrive.  She had a medical workup and was attempted to discharge home with home health aid, but immediately called EMS upon arrival home, as she was too weak to get out of the cab on her own.  On her second presentation to the ED on 04/12/20, it was deemed that she was likely too deconditioned for discharge home and could not adequately care for herself.  She would need placement in a facility.  From a medical perspective her labs and ECG were all stable in the [facility] ED.  She was continued on eliquis and diltiazem for her chronic A Fib.  Her medications are listed below.    Please note the patient is NOT on vicodin or any other opioids currently.  I was unable to remove this medication from the auto-list below.  All other medications are correct.    No current facility-administered medications on file prior to encounter.   Current Outpatient Medications on File Prior to Encounter  Medication Sig Dispense Refill  . albuterol (PROVENTIL HFA) 108 (90 BASE) MCG/ACT inhaler Inhale 2 puffs into the lungs every 6 (six) hours as needed for wheezing or shortness of breath. Shortness of breath    . amLODipine (NORVASC) 10 MG tablet Take 10 mg by mouth daily.     . celecoxib (CELEBREX) 200 MG capsule Take 200-400 mg by mouth See admin instructions. 1 tablet daily, may take an extra if needed    . colesevelam (WELCHOL) 625 MG tablet Take 1,875 mg by mouth as directed. 3 tabs am 3 tabs pm    . diltiazem (TIAZAC) 360 MG 24 hr capsule TAKE ONE CAPSULE (360MG TOTAL)  BY MOUTH DAILY (Patient taking differently: Take 360 mg by mouth daily. ) 90 capsule 3  . DULoxetine (CYMBALTA) 60 MG capsule Take 120 mg by mouth daily.   4  . econazole nitrate 1 % cream Apply topically daily. (Patient taking differently: Apply 1 application topically daily. ) 85 g 0  . ELIQUIS 5 MG TABS tablet TAKE (1) TABLET BY MOUTH TWICE DAILY. (Patient taking differently: Take 5 mg by mouth 2 (two) times daily. ) 180 tablet 1  . fluconazole (DIFLUCAN) 150 MG tablet Take 1 tablet (150 mg total) by mouth daily for 14 doses. 14 tablet 0  . fluorometholone (FML) 0.1 % ophthalmic suspension Place 1 drop into both eyes 3 (three) times daily.     . furosemide (LASIX) 40 MG tablet Take 40 mg by mouth daily as needed for fluid.     [name] gabapentin (NEURONTIN) 300 MG capsule Take 300 mg by mouth daily.    [name] HYDROcodone-acetaminophen (NORCO/VICODIN) 5-325 MG tablet Take 1 tablet by mouth every 6 (six) hours as needed for moderate pain or severe pain. 12 tablet 0  . hydrOXYzine (ATARAX/VISTARIL) 25 MG tablet Take 25 mg by mouth 4 (four) times daily as needed for itching.     . insulin aspart (NOVOLOG) 100 UNIT/ML injection insulin aspart (novoLOG) injection 0-9 Units 0-9 Units Subcutaneous, 3 times daily with meals CBG < 70: Implement Hypoglycemia Standing  Orders and refer to Hypoglycemia Standing Orders sidebar report  CBG 70 - 120: 0 units  CBG 121 - 150: 0 unit CBG 151 - 200: 2 units CBG 201 - 250: 3 units CBG 251 - 300: 5 units CBG 301 - 350: 7 units  CBG 351 - 400: 9 units CBG > 400: 10 units 10 mL 3  . LANTUS SOLOSTAR 100 UNIT/ML Solostar Pen Inject 30 Units into the skin at bedtime.     [name] levothyroxine (SYNTHROID) 300 MCG tablet Take 300 mcg by mouth daily.    . methocarbamol (ROBAXIN) 500 MG tablet Take 1 tablet (500 mg total) by mouth every 6 (six) hours as needed for muscle spasms. 20 tablet 0  . nystatin cream (MYCOSTATIN) Apply to affected area 2 times daily 30 g 5  . potassium chloride SA  (K-DUR,KLOR-CON) 20 MEQ tablet Take 20 mEq by mouth 2 (two) times daily.     . pravastatin (PRAVACHOL) 20 MG tablet Take 20 mg by mouth daily.    . traZODone (DESYREL) 150 MG tablet Take 1 tablet (150 mg total) by mouth at bedtime. (Patient taking differently: Take 150 mg by mouth at bedtime as needed for sleep. ) 30 tablet 3  . Vitamin D, Ergocalciferol, (DRISDOL) 1.25 MG (50000 UNIT) CAPS capsule Take 50,000 Units by mouth once a week.    [name] acetaminophen (TYLENOL) 325 MG tablet Take 2 tablets (650 mg total) by mouth every 6 (six) hours as needed for mild pain (or Fever >/= 101). 12 tablet 0  . magnesium oxide (MAG-OX) 400 (241.3 Mg) MG tablet Take 1 tablet (400 mg total) by mouth daily. (Patient not taking: Reported on 04/13/2020) 30 tablet 0  . senna-docusate (SENOKOT-S) 8.6-50 MG tablet Take 2 tablets by mouth at bedtime. (Patient not taking: Reported on 04/13/2020) 60 tablet 1      Bruchy Mikel, Carola Rhine, MD 04/14/20 1034    Wyvonnia Dusky, MD 04/14/20 1147

## 2020-04-14 NOTE — ED Provider Notes (Signed)
Patient medically stable for discharge to SNF.  Transportation has arrived.     Wyvonnia Dusky, MD 04/14/20 1420

## 2020-04-14 NOTE — Progress Notes (Signed)
Physical Therapy Treatment Patient Details Name: [name] [name] MRN: [id number] DOB: [birth year] Today's Date: 04/14/2020    History of Present Illness This patient is a chronically ill 64 year old female, she has a known history of cirrhosis, atrial fibrillation, she has a history of diabetes, history of thyroid cancer and unfortunately history of morbid obesity which has caused her to have difficulty taking care of her self.  She currently lives alone with her cat, she is followed by a local family primary care physician.  She has struggled with a skin rash over time, this has been treated with topical medications, she cannot recall the name of them at this time and states that she was recently called in some medications which she has been trying to use but states they are not helping.  She had to use a wipe this morning to clean herself and felt like there was significant burning of the skin after using the wipe.  The patient also notes that she has fairly severe anxiety and continually picks at her skin and has multiple picking lesions across her body.  The patient was scheduled to see her doctor because of weakness and difficulty ambulating but because of her symptoms this morning elected to call 911 for a more urgent evaluation.  She reports that she has had a dry mouth, she is not eating well, she does take her medication as prescribed and states her blood sugar this morning was around 130.    PT Comments    Patient demonstrates slow labored movement for completing sit to stands, transfers with poor tolerance for weightbearing on LLE due to c/o severe foot/ankle pain.  Patient required multiple attempts before completing sit to stand and limited to a few steps at bedside before having to sit due to fall risk and left foot/ankle pain.  Patient requested to go back to bed after therapy.  Patient will benefit from continued physical therapy in hospital and recommended venue below to increase  strength, balance, endurance for safe ADLs and gait.   Follow Up Recommendations  SNF     Equipment Recommendations  None recommended by PT    Recommendations for Other Services       Precautions / Restrictions Precautions Precautions: Fall Restrictions Weight Bearing Restrictions: No    Mobility  Bed Mobility Overal bed mobility: Needs Assistance Bed Mobility: Supine to Sit;Sit to Supine     Supine to sit: Min assist Sit to supine: Min assist   General bed mobility comments: slow labored movement  Transfers Overall transfer level: Needs assistance Equipment used: Rolling walker (2 wheeled) Transfers: Sit to/from [facility] Sit to Stand: Mod assist Stand pivot transfers: Mod assist       General transfer comment: had diffiuclty due to c/o severe pain left foot  Ambulation/Gait Ambulation/Gait assistance: Mod assist Gait Distance (Feet): 3 Feet Assistive device: Rolling walker (2 wheeled) Gait Pattern/deviations: Decreased step length - right;Decreased step length - left;Decreased stance time - left;Decreased stride length Gait velocity: slow   General Gait Details: limited to 2-3 slow labored steps due to c/o severe pain left foot and fatigue   Stairs             Wheelchair Mobility    Modified Rankin (Stroke Patients Only)       Balance Overall balance assessment: Needs assistance Sitting-balance support: No upper extremity supported;Feet supported Sitting balance-Leahy Scale: Fair Sitting balance - Comments: fair/good seated at bedside   Standing balance support: Bilateral upper extremity supported;During  functional activity Standing balance-Leahy Scale: Poor Standing balance comment: using RW, limited mostly to left foot pain                            Cognition Arousal/Alertness: Awake/alert Behavior During Therapy: WFL for tasks assessed/performed Overall Cognitive Status: Within Functional Limits for  tasks assessed                                        Exercises General Exercises - Lower Extremity Long Arc Quad: AROM;Strengthening;Both;10 reps;Seated Hip Flexion/Marching: AROM;Strengthening;Both;15 reps;Seated Toe Raises: AROM;Strengthening;Both;15 reps;Seated Heel Raises: AROM;Strengthening;Both;15 reps;Seated    General Comments        Pertinent Vitals/Pain Pain Assessment: Faces Faces Pain Scale: Hurts even more Pain Location: left foot/ankle Pain Descriptors / Indicators: Discomfort;Grimacing;Guarding;Sore    Home Living                      Prior Function            PT Goals (current goals can now be found in the care plan section) Acute Rehab PT Goals PT Goal Formulation: With patient Time For Goal Achievement: 04/27/20 Potential to Achieve Goals: Good    Frequency    Min 3X/week      PT Plan      Co-evaluation              AM-PAC PT "6 Clicks" Mobility   Outcome Measure  Help needed turning from your back to your side while in a flat bed without using bedrails?: A Little Help needed moving from lying on your back to sitting on the side of a flat bed without using bedrails?: A Little Help needed moving to and from a bed to a chair (including a wheelchair)?: A Lot Help needed standing up from a chair using your arms (e.g., wheelchair or bedside chair)?: A Lot Help needed to walk in hospital room?: A Lot Help needed climbing 3-5 steps with a railing? : Total 6 Click Score: 13    End of Session   Activity Tolerance: Patient tolerated treatment well;Patient limited by fatigue;Patient limited by pain Patient left: in bed;with call bell/phone within reach Nurse Communication: Mobility status PT Visit Diagnosis: Unsteadiness on feet (R26.81);Other abnormalities of gait and mobility (R26.89);Muscle weakness (generalized) (M62.81)     Time: 9518-8416 PT Time Calculation (min) (ACUTE ONLY): 23 min  Charges:   $Therapeutic Activity: 23-37 mins                     3:07 PM, 04/14/20 [name], MPT Physical Therapist with [facility] [phone] office [phone] mobile phone

## 2020-04-14 NOTE — TOC Transition Note (Signed)
Transition of Care [facility]) - CM/SW Discharge Note  Patient Details  Name: [name] [name] MRN: [id number] Date of Birth: [birth year]  [facility] CM/SW Contact:  Sherie Don, LCSW Phone Number: 04/14/2020, 11:30 AM  Clinical Narrative: CSW spoke with Lenox Ponds with [facility] and obtained copy of COVID vaccine documentation, which has been provided to [facility]. CSW faxed discharge summary, discharge orders, and SNF transfer report to [facility]. Patient will go to room 107 and the number to call for report is [phone]. Patient's COVID test was negative. RN updated and to call report and schedule EMS. TOC signing off.  Final next level of care: Skilled Nursing Facility Barriers to Discharge: ED Barriers Resolved  Patient Goals and CMS Choice Patient states their goals for this hospitalization and ongoing recovery are:: Discharge to [facility] CMS Medicare.gov Compare Post Acute Care list provided to:: Patient Choice offered to / list presented to : Patient  Discharge Placement Existing PASRR number confirmed : 04/13/20          Patient chooses bed at: [facility] Patient to be transferred to facility by: [facility] Patient and family notified of of transfer: 04/14/20  Discharge Plan and Services In-house Referral: Clinical Social Work Discharge Planning Services: NA Post Acute Care Choice: Page          DME Arranged: N/A DME Agency: NA HH Arranged: NA [place] Agency: NA  Readmission Risk Interventions Readmission Risk Prevention Plan 06/21/2019  Transportation Screening Not Complete  Medication Review Press photographer) Complete  PCP or Specialist appointment within 3-5 days of discharge Not Complete  HRI or Home Care Consult Complete  SW Recovery Care/Counseling Consult Complete  Palliative Care Screening Not Complete  Skilled Nursing Facility Complete  Some recent data might be hidden

## 2020-04-15 LAB — URINE CULTURE

## 2020-04-18 DIAGNOSIS — E063 Autoimmune thyroiditis: Secondary | ICD-10-CM | POA: Diagnosis not present

## 2020-04-18 DIAGNOSIS — E1140 Type 2 diabetes mellitus with diabetic neuropathy, unspecified: Secondary | ICD-10-CM | POA: Diagnosis not present

## 2020-04-18 DIAGNOSIS — I872 Venous insufficiency (chronic) (peripheral): Secondary | ICD-10-CM | POA: Diagnosis not present

## 2020-04-18 DIAGNOSIS — M170 Bilateral primary osteoarthritis of knee: Secondary | ICD-10-CM | POA: Diagnosis not present

## 2020-04-23 DIAGNOSIS — M2041 Other hammer toe(s) (acquired), right foot: Secondary | ICD-10-CM | POA: Diagnosis not present

## 2020-04-23 DIAGNOSIS — L603 Nail dystrophy: Secondary | ICD-10-CM | POA: Diagnosis not present

## 2020-04-23 DIAGNOSIS — M2042 Other hammer toe(s) (acquired), left foot: Secondary | ICD-10-CM | POA: Diagnosis not present

## 2020-04-23 DIAGNOSIS — E1159 Type 2 diabetes mellitus with other circulatory complications: Secondary | ICD-10-CM | POA: Diagnosis not present

## 2020-05-04 ENCOUNTER — Ambulatory Visit: Payer: PPO | Admitting: General Practice

## 2020-05-04 NOTE — Progress Notes (Signed)
Cardiology Clinic Note   Patient Name: [name] [name] Date of Encounter: 05/08/2020  Primary Care Provider:  Redmond School, MD Primary Cardiologist:  Rozann Lesches, MD  Patient Profile    [name] [name] 64 year old female presents to the clinic today for follow-up evaluation of her essential hypertension and atrial fibrillation.  Past Medical History    Past Medical History:  Diagnosis Date  . Anemia   . Anxiety   . Arthritis   . Asthma   . Atrial fibrillation ([place])   . Cirrhosis of liver ([place])   . COPD (chronic obstructive pulmonary disease) ([place])   . Coronary artery disease   . Depression   . Essential hypertension   . GERD (gastroesophageal reflux disease)   . Hypothyroidism   . Morbid obesity ([place])   . Overactive bladder   . Sleep apnea    CPAP - not consistent  . Thyroid cancer ([place])   . Type 2 diabetes mellitus ([place])    Past Surgical History:  Procedure Laterality Date  . "pump bumps"     bilateral heels--2000  . ABDOMINAL HYSTERECTOMY  1985  . CARDIAC CATHETERIZATION  2012   Dr. Lia Foyer told her nothing was wrong.  [name]  . CHOLECYSTECTOMY  1996  . COLONOSCOPY WITH PROPOFOL N/A 04/17/2015   Procedure: COLONOSCOPY WITH PROPOFOL  at cecum at 0810; withdrawal time =15 minutes;  Surgeon: [name], MD;  Location: [facility];  Service: Endoscopy;  Laterality: N/A;  . Debridement of abdominal wound  2011  . ESOPHAGEAL DILATION N/A 04/17/2015   Procedure: ESOPHAGEAL DILATION WITH 56FR MALONEY DILATOR;  Surgeon: [name], MD;  Location: [facility];  Service: Endoscopy;  Laterality: N/A;  . ESOPHAGOGASTRODUODENOSCOPY (EGD) WITH PROPOFOL N/A 04/17/2015   Procedure: ESOPHAGOGASTRODUODENOSCOPY (EGD) WITH PROPOFOL;  Surgeon: [name], MD;  Location: [facility];  Service: Endoscopy;  Laterality: N/A;  . EXPLORATORY LAPAROTOMY  2003  . FOOT SURGERY Bilateral 2000   Bone spur removed  . FOOT SURGERY    . INCISIONAL HERNIA REPAIR N/A  06/09/2014   Procedure: RECURRENT  INCISIONAL HERNIORRHAPHY WITH MESH;  Surgeon: [name], MD;  Location: [facility];  Service: General;  Laterality: N/A;  . INCISIONAL HERNIA REPAIR N/A 10/15/2014   Procedure: [place];  Surgeon: [name], MD;  Location: [facility];  Service: General;  Laterality: N/A;  . INSERTION OF MESH N/A 06/09/2014   Procedure: INSERTION OF MESH;  Surgeon: [name], MD;  Location: [facility];  Service: General;  Laterality: N/A;  . INSERTION OF MESH N/A 10/15/2014   Procedure: INSERTION OF MESH;  Surgeon: [name], MD;  Location: [facility];  Service: General;  Laterality: N/A;  . POLYPECTOMY  04/17/2015   Procedure: POLYPECTOMY;  Surgeon: [name], MD;  Location: [facility];  Service: Endoscopy;;  . RESECTION DISTAL CLAVICAL  05/07/2012   Procedure: RESECTION DISTAL CLAVICAL;  Surgeon: [name], MD;  Location: [facility];  Service: Orthopedics;  Laterality: Right;  . SHOULDER OPEN ROTATOR CUFF REPAIR  05/07/2012   Procedure: ROTATOR CUFF REPAIR SHOULDER OPEN;  Surgeon: [name], MD;  Location: [facility];  Service: Orthopedics;  Laterality: Right;  . TEE WITHOUT CARDIOVERSION N/A 12/26/2018   Procedure: TRANSESOPHAGEAL ECHOCARDIOGRAM (TEE) WITH PROPOFOL;  Surgeon: Arnoldo Lenis, MD;  [facility];  Service: Endoscopy;  Laterality: N/A;  . THYROIDECTOMY  2009  . TONSILLECTOMY  1958  . UMBILICAL HERNIA REPAIR  2010    Allergies  Allergies  Allergen Reactions  . Doxycycline Itching  . Adhesive [Tape] Other (See Comments)    Blisters skin  . Biaxin [Clarithromycin] Other (See Comments)    Really bad yeast infection  . Percocet [Oxycodone-Acetaminophen] Itching  . Xarelto [Rivaroxaban] Itching  . Clarithromycin Rash    Yeast Infection  . Clindamycin/Lincomycin Rash    Yeast Infection   . Neosporin [Neomycin-Polymyxin B Gu] Rash    Eyes Drops only  . Penicillins Rash    Has patient had a PCN  reaction causing immediate rash, facial/tongue/throat swelling, SOB or lightheadedness with hypotension: Yes Has patient had a PCN reaction causing severe rash involving mucus membranes or skin necrosis: No Has patient had a PCN reaction that required hospitalization: No Has patient had a PCN reaction occurring within the last 10 years: No If all of the above answers are "NO", then may proceed with Cephalosporin use.     History of Present Illness    Ms. Romberger has a PMH of essential hypertension, atrial fibrillation, COPD, sleep apnea, nonalcoholic steatohepatitis, GERD, type 2 diabetes, hypothyroidism, AKI, precordial pain, and hypokalemia.  She was last contacted by Dr. Domenic Polite on 09/12/2019.  She was evaluated virtually.  She denied prolonged palpitations and indicated she had brief episodes.  She reported some mechanical falls but denied syncope.  Her medications were reviewed.  She denied bleeding issues.  Cardizem was continued.  CHA2DS2-VASc score 5  She was seen in the emergency department on 04/14/2020.  She complained of dry mouth, skin rash, poor appetite, and failure to thrive.  She had a medical work-up.  It was attempted that she discharged home with home health aide but immediately call EMS upon arrival home.  She was unable to get out of the cab on her own.  She was evaluated by physical therapy and recommended to be placed in skilled nursing facility due to her significant deconditioning.  She presents the clinic today for follow-up evaluation and states she has returned home after a short stent at the nursing home/rehab facility.  She states that her strength and energy levels are much better now.  She has been working on cleaning her house.  She states that she is following a low-sodium diet but does apply salt to foods like tomatoes and Brussels sprouts.  She is in the process of looking for assisted living.  On exam she has bilateral lower extremity venous stasis versus  dependent edema.  She does have weeping ulcers on bilateral lower extremities.  She states Tuesday she was at her PCPs office who prescribed furosemide and antibiotics.  She plans to start her furosemide after her appointment today.  I will give her the salty 6 diet sheet, [place] support stocking information, and have her follow-up in 1 month.  Today she denies chest pain, shortness of breath,  fatigue, palpitations, melena, hematuria, hemoptysis, diaphoresis, weakness, presyncope, syncope, orthopnea, and PND.   Home Medications    Prior to Admission medications   Medication Sig Start Date End Date Taking? Authorizing Provider  acetaminophen (TYLENOL) 325 MG tablet Take 2 tablets (650 mg total) by mouth every 6 (six) hours as needed for mild pain (or Fever >/= 101). 06/26/19   Roxan Hockey, MD  albuterol (PROVENTIL HFA) 108 (90 BASE) MCG/ACT inhaler Inhale 2 puffs into the lungs every 6 (six) hours as needed for wheezing or shortness of breath. Shortness of breath    [provider]  amLODipine (NORVASC) 10 MG tablet  Take 10 mg by mouth daily.  03/20/20   [provider]  celecoxib (CELEBREX) 200 MG capsule Take 200-400 mg by mouth See admin instructions. 1 tablet daily, may take an extra if needed 02/18/20   [provider]  colesevelam (WELCHOL) 625 MG tablet Take 1,875 mg by mouth as directed. 3 tabs am 3 tabs pm    [provider]  diltiazem (TIAZAC) 360 MG 24 hr capsule TAKE ONE CAPSULE (360MG TOTAL) BY MOUTH DAILY Patient taking differently: Take 360 mg by mouth daily.  05/20/19   Strader, Fransisco Hertz, PA-C  DULoxetine (CYMBALTA) 60 MG capsule Take 120 mg by mouth daily.  03/20/15   [provider]  econazole nitrate 1 % cream Apply topically daily. Patient taking differently: Apply 1 application topically daily.  12/09/15   Tuchman, Richard C, DPM  ELIQUIS 5 MG TABS tablet TAKE (1) TABLET BY MOUTH TWICE DAILY. Patient taking differently: Take 5  mg by mouth 2 (two) times daily.  01/21/20   Satira Sark, MD  fluorometholone (FML) 0.1 % ophthalmic suspension Place 1 drop into both eyes 3 (three) times daily.  11/22/19   [provider]  furosemide (LASIX) 40 MG tablet Take 40 mg by mouth daily as needed for fluid.  11/08/19   [provider]  gabapentin (NEURONTIN) 300 MG capsule Take 300 mg by mouth daily. 10/19/18   [provider]  HYDROcodone-acetaminophen (NORCO/VICODIN) 5-325 MG tablet Take 1 tablet by mouth every 6 (six) hours as needed for moderate pain or severe pain. 06/26/19   Roxan Hockey, MD  hydrOXYzine (ATARAX/VISTARIL) 25 MG tablet Take 25 mg by mouth 4 (four) times daily as needed for itching.  10/12/18   [provider]  insulin aspart (NOVOLOG) 100 UNIT/ML injection insulin aspart (novoLOG) injection 0-9 Units 0-9 Units Subcutaneous, 3 times daily with meals CBG < 70: Implement Hypoglycemia Standing Orders and refer to Hypoglycemia Standing Orders sidebar report  CBG 70 - 120: 0 units  CBG 121 - 150: 0 unit CBG 151 - 200: 2 units CBG 201 - 250: 3 units CBG 251 - 300: 5 units CBG 301 - 350: 7 units  CBG 351 - 400: 9 units CBG > 400: 10 units 06/26/19 06/25/20  Emokpae, Courage, MD  LANTUS SOLOSTAR 100 UNIT/ML Solostar Pen Inject 30 Units into the skin at bedtime.  09/21/18   [provider]  levothyroxine (SYNTHROID) 300 MCG tablet Take 300 mcg by mouth daily. 03/27/20   [provider]  magnesium oxide (MAG-OX) 400 (241.3 Mg) MG tablet Take 1 tablet (400 mg total) by mouth daily. Patient not taking: Reported on 04/13/2020 06/28/19   Lucky Cowboy, MD  methocarbamol (ROBAXIN) 500 MG tablet Take 1 tablet (500 mg total) by mouth every 6 (six) hours as needed for muscle spasms. 12/26/18   Manuella Ghazi, Pratik D, DO  nystatin cream (MYCOSTATIN) Apply to affected area 2 times daily 04/12/20   Noemi Chapel, MD  potassium chloride SA (K-DUR,KLOR-CON) 20 MEQ tablet Take 20 mEq by mouth 2 (two) times  daily.  04/27/18   [provider]  pravastatin (PRAVACHOL) 20 MG tablet Take 20 mg by mouth daily. 10/30/18   [provider]  senna-docusate (SENOKOT-S) 8.6-50 MG tablet Take 2 tablets by mouth at bedtime. Patient not taking: Reported on 04/13/2020 06/26/19 06/25/20  Roxan Hockey, MD  traZODone (DESYREL) 150 MG tablet Take 1 tablet (150 mg total) by mouth at bedtime. Patient taking differently: Take 150 mg by mouth  at bedtime as needed for sleep.  06/26/19   Roxan Hockey, MD  Vitamin D, Ergocalciferol, (DRISDOL) 1.25 MG (50000 UNIT) CAPS capsule Take 50,000 Units by mouth once a week. 12/11/19   [provider]    Family History    Family History  Problem Relation Age of Onset  . Heart disease Other   . Arthritis Other   . Cancer Other   . Asthma Other   . Diabetes Other   . Kidney disease Other    She indicated that her mother is alive. She indicated that her father is deceased.  Social History    Social History   Socioeconomic History  . Marital status: Widowed    Spouse name: Not on file  . Number of children: Not on file  . Years of education: 34  . Highest education level: Not on file  Occupational History  . Occupation: Programme researcher, broadcasting/film/video: [employer]    Comment: [place] for daycare  Tobacco Use  . Smoking status: Never Smoker  . Smokeless tobacco: Never Used  Vaping Use  . Vaping Use: Never used  Substance and Sexual Activity  . Alcohol use: No    Alcohol/week: 0.0 standard drinks  . Drug use: No  . Sexual activity: Yes    Birth control/protection: Surgical  Other Topics Concern  . Not on file  Social History Narrative   Pt reports that her granddaughter has  Moved in with her,    Social Determinants of Health   Financial Resource Strain:   . Difficulty of Paying Living Expenses: Not on file  Food Insecurity:   . Worried About Charity fundraiser in the Last Year: Not on file  . Ran Out of Food in the Last  Year: Not on file  Transportation Needs:   . Lack of Transportation (Medical): Not on file  . Lack of Transportation (Non-Medical): Not on file  Physical Activity:   . Days of Exercise per Week: Not on file  . Minutes of Exercise per Session: Not on file  Stress:   . Feeling of Stress : Not on file  Social Connections:   . Frequency of Communication with Friends and Family: Not on file  . Frequency of Social Gatherings with Friends and Family: Not on file  . Attends Religious Services: Not on file  . Active Member of Clubs or Organizations: Not on file  . Attends Archivist Meetings: Not on file  . Marital Status: Not on file  Intimate Partner Violence:   . Fear of Current or Ex-Partner: Not on file  . Emotionally Abused: Not on file  . Physically Abused: Not on file  . Sexually Abused: Not on file     Review of Systems    General:  No chills, fever, night sweats or weight changes.  Cardiovascular:  No chest pain, dyspnea on exertion, edema, orthopnea, palpitations, paroxysmal nocturnal dyspnea. Dermatological: No rash, lesions/masses Respiratory: No cough, dyspnea Urologic: No hematuria, dysuria Abdominal:   No nausea, vomiting, diarrhea, bright red blood per rectum, melena, or hematemesis Neurologic:  No visual changes, wkns, changes in mental status. All other systems reviewed and are otherwise negative except as noted above.  Physical Exam    VS:  BP (!) 152/64   Pulse 86   Ht 5' 4"  (1.626 m)   Wt 253 lb (114.8 kg)   BMI 43.43 kg/m  , BMI Body mass index is 43.43 kg/m. GEN: Well nourished, well  developed, in no acute distress. HEENT: normal. Neck: Supple, no JVD, carotid bruits, or masses. Cardiac: RRR, no murmurs, rubs, or gallops. No clubbing, cyanosis, bilateral lower extremity  dependent edema.  Radials/DP/PT 2+ and equal bilaterally.  Respiratory:  Respirations regular and unlabored, clear to auscultation bilaterally. GI: Soft, nontender,  nondistended, BS + x 4. MS: no deformity or atrophy. Skin: warm and dry, no rash.  Bilateral weeping venous stasis ulcers Neuro:  Strength and sensation are intact. Psych: Normal affect.  Accessory Clinical Findings    Recent Labs: 05/30/2019: B Natriuretic Peptide 25.0 04/12/2020: ALT 16; BUN 9; Creatinine, Ser 0.72; Hemoglobin 9.0; Magnesium 1.7; Platelets 209; Potassium 3.1; Sodium 136; TSH 4.137   Recent Lipid Panel No results found for: CHOL, TRIG, HDL, CHOLHDL, VLDL, LDLCALC, LDLDIRECT  ECG personally reviewed by me today-none today.  EKG 04/13/2020 Sinus tachycardia with a regular rate repolarization abnormalities 115 bpm  EKG 06/20/2019 Sinus tachycardia with frequent PACs and nonspecific ST changes  Echocardiogram 12/26/2018 IMPRESSIONS    1. The left ventricle has normal systolic function, with an ejection  fraction of 55-60%. The cavity size was normal.  2. Left atrial size was LA is enlarged.  3. Normal LA appendage without thrombus, normal emptying velocities.  4. No evidence of mitral valve stenosis.  5. The aortic valve is tricuspid No stenosis of the aortic valve.  6. The aortic root is normal in size and structure.  7. The interatrial septum appears to be lipomatous.  8. No evidence of vegetations   Assessment & Plan   1.  Persistent atrial fibrillation-HR 86 .  CHA2DS2-VASc score 5.  No increased activity intolerance or shortness of breath.  Feeling much better since returned home from rehab facility. Continue Eliquis, Cardizem Avoid triggers caffeine, chocolate, EtOH etc. Increase physical activity as tolerated Heart healthy low-sodium diet  Mixed hyperlipidemia-reports compliance with statin. Continue pravastatin Heart healthy low-sodium high-fiber diet Increase physical activity as tolerated  Essential hypertension-BP today 152/64.  Starting furosemide this afternoon for venous stasis/dependent lower extremity edema.  Prescribed by  PCP Continue amlodipine, diltiazem Heart healthy low-sodium diet-salty 6 given Increase physical activity as tolerated  Failure to thrive-progressing with diet and physical therapy.  Discharged from emergency department to skilled nursing facility after evaluation from physical therapy. Heart healthy low-sodium diet Increase physical activity as tolerated Follows with PCP  Disposition: Follow-up with Dr. Domenic Polite or APP in 1 month    Jossie Ng. Treasure Ingrum NP-C    05/08/2020, 11:06 AM [place] [address] Office [phone] Fax [phone]  Notice: This dictation was prepared with Dragon dictation along with smaller phrase technology. Any transcriptional errors that result from this process are unintentional and may not be corrected upon review.

## 2020-05-05 DIAGNOSIS — I4891 Unspecified atrial fibrillation: Secondary | ICD-10-CM | POA: Diagnosis not present

## 2020-05-05 DIAGNOSIS — L03116 Cellulitis of left lower limb: Secondary | ICD-10-CM | POA: Diagnosis not present

## 2020-05-05 DIAGNOSIS — R5383 Other fatigue: Secondary | ICD-10-CM | POA: Diagnosis not present

## 2020-05-05 DIAGNOSIS — Z6841 Body Mass Index (BMI) 40.0 and over, adult: Secondary | ICD-10-CM | POA: Diagnosis not present

## 2020-05-07 DIAGNOSIS — Z794 Long term (current) use of insulin: Secondary | ICD-10-CM | POA: Diagnosis not present

## 2020-05-07 DIAGNOSIS — I2510 Atherosclerotic heart disease of native coronary artery without angina pectoris: Secondary | ICD-10-CM | POA: Diagnosis not present

## 2020-05-07 DIAGNOSIS — J449 Chronic obstructive pulmonary disease, unspecified: Secondary | ICD-10-CM | POA: Diagnosis not present

## 2020-05-07 DIAGNOSIS — M1990 Unspecified osteoarthritis, unspecified site: Secondary | ICD-10-CM | POA: Diagnosis not present

## 2020-05-07 DIAGNOSIS — E119 Type 2 diabetes mellitus without complications: Secondary | ICD-10-CM | POA: Diagnosis not present

## 2020-05-07 DIAGNOSIS — Z6841 Body Mass Index (BMI) 40.0 and over, adult: Secondary | ICD-10-CM | POA: Diagnosis not present

## 2020-05-07 DIAGNOSIS — G4733 Obstructive sleep apnea (adult) (pediatric): Secondary | ICD-10-CM | POA: Diagnosis not present

## 2020-05-07 DIAGNOSIS — E785 Hyperlipidemia, unspecified: Secondary | ICD-10-CM | POA: Diagnosis not present

## 2020-05-07 DIAGNOSIS — Z9089 Acquired absence of other organs: Secondary | ICD-10-CM | POA: Diagnosis not present

## 2020-05-07 DIAGNOSIS — N3281 Overactive bladder: Secondary | ICD-10-CM | POA: Diagnosis not present

## 2020-05-07 DIAGNOSIS — Z7901 Long term (current) use of anticoagulants: Secondary | ICD-10-CM | POA: Diagnosis not present

## 2020-05-07 DIAGNOSIS — Z90710 Acquired absence of both cervix and uterus: Secondary | ICD-10-CM | POA: Diagnosis not present

## 2020-05-07 DIAGNOSIS — Z85850 Personal history of malignant neoplasm of thyroid: Secondary | ICD-10-CM | POA: Diagnosis not present

## 2020-05-07 DIAGNOSIS — Z87440 Personal history of urinary (tract) infections: Secondary | ICD-10-CM | POA: Diagnosis not present

## 2020-05-07 DIAGNOSIS — F419 Anxiety disorder, unspecified: Secondary | ICD-10-CM | POA: Diagnosis not present

## 2020-05-07 DIAGNOSIS — K7581 Nonalcoholic steatohepatitis (NASH): Secondary | ICD-10-CM | POA: Diagnosis not present

## 2020-05-07 DIAGNOSIS — K7460 Unspecified cirrhosis of liver: Secondary | ICD-10-CM | POA: Diagnosis not present

## 2020-05-07 DIAGNOSIS — I4891 Unspecified atrial fibrillation: Secondary | ICD-10-CM | POA: Diagnosis not present

## 2020-05-07 DIAGNOSIS — E039 Hypothyroidism, unspecified: Secondary | ICD-10-CM | POA: Diagnosis not present

## 2020-05-07 DIAGNOSIS — I10 Essential (primary) hypertension: Secondary | ICD-10-CM | POA: Diagnosis not present

## 2020-05-07 DIAGNOSIS — K219 Gastro-esophageal reflux disease without esophagitis: Secondary | ICD-10-CM | POA: Diagnosis not present

## 2020-05-07 DIAGNOSIS — F32A Depression, unspecified: Secondary | ICD-10-CM | POA: Diagnosis not present

## 2020-05-08 ENCOUNTER — Other Ambulatory Visit: Payer: Self-pay

## 2020-05-08 ENCOUNTER — Encounter: Payer: Self-pay | Admitting: General Practice

## 2020-05-08 ENCOUNTER — Ambulatory Visit: Payer: PPO | Admitting: General Practice

## 2020-05-08 VITALS — BP 152/64 | HR 86 | Ht 64.0 in | Wt 253.0 lb

## 2020-05-08 DIAGNOSIS — L97919 Non-pressure chronic ulcer of unspecified part of right lower leg with unspecified severity: Secondary | ICD-10-CM

## 2020-05-08 DIAGNOSIS — I83029 Varicose veins of left lower extremity with ulcer of unspecified site: Secondary | ICD-10-CM | POA: Diagnosis not present

## 2020-05-08 DIAGNOSIS — I83019 Varicose veins of right lower extremity with ulcer of unspecified site: Secondary | ICD-10-CM

## 2020-05-08 DIAGNOSIS — L97929 Non-pressure chronic ulcer of unspecified part of left lower leg with unspecified severity: Secondary | ICD-10-CM | POA: Diagnosis not present

## 2020-05-08 DIAGNOSIS — E782 Mixed hyperlipidemia: Secondary | ICD-10-CM | POA: Diagnosis not present

## 2020-05-08 DIAGNOSIS — I4819 Other persistent atrial fibrillation: Secondary | ICD-10-CM | POA: Diagnosis not present

## 2020-05-08 DIAGNOSIS — I10 Essential (primary) hypertension: Secondary | ICD-10-CM | POA: Diagnosis not present

## 2020-05-08 DIAGNOSIS — R627 Adult failure to thrive: Secondary | ICD-10-CM | POA: Diagnosis not present

## 2020-05-08 NOTE — Patient Instructions (Addendum)
Medication Instructions:  Your physician recommends that you continue on your current medications as directed. Please refer to the Current Medication list given to you today.  *If you need a refill on your cardiac medications before your next appointment, please call your pharmacy*   Lab Work: None today If you have labs (blood work) drawn today and your tests are completely normal, you will receive your results only by: [name] MyChart Message (if you have MyChart) OR . A paper copy in the mail If you have any lab test that is abnormal or we need to change your treatment, we will call you to review the results.   Testing/Procedures: None today   Follow-Up: At [facility], you and your health needs are our priority.  As part of our continuing mission to provide you with exceptional heart care, we have created designated Provider Care Teams.  These Care Teams include your primary Cardiologist (physician) and Advanced Practice Providers (APPs -  Physician Assistants and Nurse Practitioners) who all work together to provide you with the care you need, when you need it.  We recommend signing up for the patient portal called "MyChart".  Sign up information is provided on this After Visit Summary.  MyChart is used to connect with patients for Virtual Visits (Telemedicine).  Patients are able to view lab/test results, encounter notes, upcoming appointments, etc.  Non-urgent messages can be sent to your provider as well.   To learn more about what you can do with MyChart, go to [email or web address].    Your next appointment:   1 month(s)  The format for your next appointment:   In Person  Provider:   You may see Rozann Lesches, MD or one of the following Advanced Practice Providers on your designated Care Team:    Bernerd Pho, PA-C   Ermalinda Barrios, PA-C     Other Instructions Restrict fluid intake to 48-64 ounces daily  Weigh self daily   Call if you gain more than 3 lbs  over nite, or 5 lbs in 1 week   Order compression stockings through [facility], brochure provided   Follow [place] guidelines   Referral placed to Physical Therapy  Thank you for choosing [facility] !

## 2020-05-08 NOTE — Addendum Note (Signed)
Addended by: Barbarann Ehlers A on: 05/08/2020 12:01 PM   Modules accepted: Orders

## 2020-05-11 ENCOUNTER — Telehealth: Payer: Self-pay | Admitting: Licensed Clinical Social Worker

## 2020-05-11 ENCOUNTER — Other Ambulatory Visit: Payer: Self-pay

## 2020-05-11 ENCOUNTER — Ambulatory Visit (HOSPITAL_COMMUNITY): Payer: PPO | Attending: General Practice | Admitting: Physical Therapy

## 2020-05-11 ENCOUNTER — Encounter (HOSPITAL_COMMUNITY): Payer: Self-pay | Admitting: Physical Therapy

## 2020-05-11 DIAGNOSIS — S81801S Unspecified open wound, right lower leg, sequela: Secondary | ICD-10-CM | POA: Diagnosis not present

## 2020-05-11 DIAGNOSIS — R600 Localized edema: Secondary | ICD-10-CM | POA: Diagnosis not present

## 2020-05-11 DIAGNOSIS — S81802S Unspecified open wound, left lower leg, sequela: Secondary | ICD-10-CM

## 2020-05-11 NOTE — Telephone Encounter (Signed)
CSW referred to assist patient with obtaining a scale. CSW contacted patient to inform scale will be delivered to home. Patient grateful for support and assistance. CSW available as needed. Jackie Autym Siess, LCSW, CCSW-MCS [phone]  

## 2020-05-11 NOTE — Therapy (Signed)
[place], [place], [address] Phone: [phone]   Fax:  [phone]  Wound Care Evaluation  Patient Details  Name: [name] [name] MRN: [id number] Date of Birth: 1956/01/17 Referring Provider (PT): Coletta Memos   Encounter Date: 05/11/2020   PT End of Session - 05/11/20 1207    Visit Number 1    Number of Visits 4    Date for PT Re-Evaluation 06/10/20    Authorization Type [employer]    Authorization - Visit Number 1    Authorization - Number of Visits 4    Progress Note Due on Visit 4    PT Start Time 0786    PT Stop Time 1135    PT Time Calculation (min) 50 min           Past Medical History:  Diagnosis Date  . Anemia   . Anxiety   . Arthritis   . Asthma   . Atrial fibrillation ([place])   . Cirrhosis of liver ([place])   . COPD (chronic obstructive pulmonary disease) ([place])   . Coronary artery disease   . Depression   . Essential hypertension   . GERD (gastroesophageal reflux disease)   . Hypothyroidism   . Morbid obesity ([place])   . Overactive bladder   . Sleep apnea    CPAP - not consistent  . Thyroid cancer ([place])   . Type 2 diabetes mellitus ([place])     Past Surgical History:  Procedure Laterality Date  . "pump bumps"     bilateral heels--2000  . ABDOMINAL HYSTERECTOMY  1985  . CARDIAC CATHETERIZATION  2012   Dr. Lia Foyer told her nothing was wrong.  [name] [place]  . CHOLECYSTECTOMY  1996  . COLONOSCOPY WITH PROPOFOL N/A 04/17/2015   Procedure: COLONOSCOPY WITH PROPOFOL  at cecum at 0810; withdrawal time =15 minutes;  Surgeon: [name], MD;  [facility];  Service: Endoscopy;  Laterality: N/A;  . Debridement of abdominal wound  2011  . ESOPHAGEAL DILATION N/A 04/17/2015   Procedure: ESOPHAGEAL DILATION WITH 56FR MALONEY DILATOR;  Surgeon: [name], MD;  Location: [facility];  Service: Endoscopy;  Laterality: N/A;  . ESOPHAGOGASTRODUODENOSCOPY (EGD) WITH  PROPOFOL N/A 04/17/2015   Procedure: ESOPHAGOGASTRODUODENOSCOPY (EGD) WITH PROPOFOL;  Surgeon: [name], MD;  Location: [facility];  Service: Endoscopy;  Laterality: N/A;  . EXPLORATORY LAPAROTOMY  2003  . FOOT SURGERY Bilateral 2000   Bone spur removed  . FOOT SURGERY    . INCISIONAL HERNIA REPAIR N/A 06/09/2014   Procedure: RECURRENT  INCISIONAL HERNIORRHAPHY WITH MESH;  Surgeon: [name], MD;  Location: [facility];  Service: General;  Laterality: N/A;  . INCISIONAL HERNIA REPAIR N/A 10/15/2014   Procedure: [place];  Surgeon: [name], MD;  Location: [facility];  Service: General;  Laterality: N/A;  . INSERTION OF MESH N/A 06/09/2014   Procedure: INSERTION OF MESH;  Surgeon: [name], MD;  Location: [facility];  Service: General;  Laterality: N/A;  . INSERTION OF MESH N/A 10/15/2014   Procedure: INSERTION OF MESH;  Surgeon: [name], MD;  Location: [facility];  Service: General;  Laterality: N/A;  . POLYPECTOMY  04/17/2015   Procedure: POLYPECTOMY;  Surgeon: [name], MD;  Location: [facility];  Service: Endoscopy;;  . RESECTION DISTAL CLAVICAL  05/07/2012   Procedure: RESECTION DISTAL CLAVICAL;  Surgeon: [name], MD;  Location: [facility];  Service:  Orthopedics;  Laterality: Right;  . SHOULDER OPEN ROTATOR CUFF REPAIR  05/07/2012   Procedure: ROTATOR CUFF REPAIR SHOULDER OPEN;  Surgeon: [name], MD;  [facility];  Service: Orthopedics;  Laterality: Right;  . TEE WITHOUT CARDIOVERSION N/A 12/26/2018   Procedure: TRANSESOPHAGEAL ECHOCARDIOGRAM (TEE) WITH PROPOFOL;  Surgeon: Arnoldo Lenis, MD;  [facility];  Service: Endoscopy;  Laterality: N/A;  . THYROIDECTOMY  2009  . TONSILLECTOMY  1958  . UMBILICAL HERNIA REPAIR  2010    There were no vitals filed for this visit.     [facility] PT Assessment - 05/11/20 0001      Assessment   Medical Diagnosis B LE edema with wounds     Referring Provider (PT) Coletta Memos    Onset Date/Surgical Date 03/20/20    Prior Therapy for weakness; wounds       Precautions   Precautions --   cellulitis     Restrictions   Weight Bearing Restrictions No      Balance Screen   Has the patient fallen in the past 6 months No    Has the patient had a decrease in activity level because of a fear of falling?  Yes    Is the patient reluctant to leave their home because of a fear of falling?  Yes      [place] residence      Prior Function   Level of Independence Independent with household mobility with device      Cognition   Overall Cognitive Status Within Functional Limits for tasks assessed           Wound Therapy - 05/11/20 0001    Subjective [name] [name] states that she has been on a low dose of antibiotics but it does not seem to be helping her legs continue to itch and now she has weeping from her Left leg that won't stop. She states the weeping is ruining her clothes and shoes.  She describes herself as a,"chronic scratcher" and states that she just can not stop herself, if her legs itch she is going to scratch them.      Patient and Family Stated Goals wounds to heal, itching to stop     Date of Onset 03/20/20    Prior Treatments wound care, pt was urged to get compression garment to keep edema down but pt states her leg got better so she did not persue it.      Pain Scale --   no pain just itching    Evaluation and Treatment Procedures Explained to Patient/Family Yes    Evaluation and Treatment Procedures agreed to    Wound Therapy - Clinical Statement [name] [name] is a 64 yo female who has had noted increased edema in her LE for years, she has been hospitalized more than once with cellulitis.  The patient has been advised to obtain compression garments but has not done so, she has never had a compression pump or one suggested .  At this time she comes to the department with greater than 15 small openings on both  LE with noted weeping causing a puddle on the floor from her Lt LE.  [name] [name] was referred for B venous stasis ulcers of both LE.  There are no noted varicosities, however, both LE are indurated with multiple scratch like wounds.  [name] [name] will benefit from skilled PT to create a healing enviornment for her LE, apply an  appropriate compression bandage to decrease her edema and educate her on post treatment self care to prevent her wounds and edema from returning.    Wound Therapy - Functional Problem List difficulty with dressing due to weeping of Lt LE     Factors Delaying/Impairing Wound Healing Altered sensation;Diabetes Mellitus;Infection - systemic/local;Immobility;Multiple medical problems;Polypharmacy;Vascular compromise    Hydrotherapy Plan Debridement;Dressing change;Patient/family education;Other (comment)   decongestive manual techniques to decrease edema    Wound Therapy - Frequency Other (comment)   1 x  week  for 4 weeks    Wound Therapy - Current Recommendations PT    Wound Plan manual decongestive techniques, debridement as needed , compression bandaging.  Pt to be measured for compression garment once edema is down, obtain compression pump.     Dressing  xeroform to wounds, 4x4, netting , abpads to obtain a conial shape f/b application of profore compression bandaging system B.     Manual Therapy B from ankle to knee             LYMPHEDEMA/ONCOLOGY QUESTIONNAIRE - 05/11/20 0001      What other symptoms do you have   Are you Having Heaviness or Tightness Yes    Are you having Pain No    Are you having pitting edema Yes    Body Site LE B     Is it Hard or Difficult finding clothes that fit Yes    Do you have infections Yes    Comments hospitalized in the past ; on constant antiobiotic       Lymphedema Stage   Stage STAGE 2 SPONTANEOUSLY IRREVERSIBLE      Lymphedema Assessments   Lymphedema Assessments Lower extremities      Right Lower Extremity Lymphedema   30  cm Proximal to Suprapatella 44.5 cm    20 cm Proximal to Suprapatella 31 cm    10 cm Proximal to Suprapatella 23 cm      Left Lower Extremity Lymphedema   30 cm Proximal to Suprapatella 41.7 cm    20 cm Proximal to Suprapatella 29.3 cm    10 cm Proximal to Suprapatella 22.8 cm              Objective measurements completed on examination: See above findings.             PT Education - 05/11/20 1206    Education Details the importance of getting and wearing compression garment following PT.  The importance of skin care and to quit scratching her LE    Person(s) Educated Patient    Methods Explanation    Comprehension Verbalized understanding            PT Short Term Goals - 05/11/20 1216      PT SHORT TERM GOAL #1   Title Pt to have 5 or less wounds on each leg and LT LE to no longer be weeping to decrease risk of infection    Time 2    Period Weeks    Status New    Target Date 05/25/20      PT SHORT TERM GOAL #2   Title PT to verbalize that she is not to scratch her legs under any circumstance as this can cause wounds and infection.    Time 2    Period Weeks    Status New      PT SHORT TERM GOAL #3   Title PT to have been measured for her compression garment , pt already states  she is going to obtain garments from elastic therapy.    Time 2    Period Weeks    Status New             PT Long Term Goals - 05/11/20 1219      PT LONG TERM GOAL #1   Title PT to no longer have any wounds on her either LE    Time 4    Period Weeks    Status New    Target Date 06/08/20      PT LONG TERM GOAL #2   Title PT to have and be donning compression garment to prevent edema in LE    Time 4    Period Weeks    Status New      PT LONG TERM GOAL #3   Title PT to have obtained and be using compression pump to prevent edema and cellulitis    Time 4    Period Weeks    Status New                Plan - 05/11/20 1208    Clinical Impression Statement see  above    Personal Factors and Comorbidities Comorbidity 3+;Fitness;Time since onset of injury/illness/exacerbation    Comorbidities OA, cirrhosis of the liver, COPD, obesity, hx of cellulitis, DM    Examination-Activity Limitations Dressing    Examination-Participation Restrictions Shop    Stability/Clinical Decision Making Stable/Uncomplicated    Clinical Decision Making Moderate    Rehab Potential Good    PT Frequency 1x / week    PT Duration 4 weeks    PT Treatment/Interventions Manual techniques;Other (comment);Compression bandaging   debridement   PT Next Visit Plan assess wounds, continue with manual for edema, once edema has stabilized measure for compression garment, see if order for pump has returned if so, send to [facility].           Patient will benefit from skilled therapeutic intervention in order to improve the following deficits and impairments:  Decreased skin integrity, Obesity, Increased edema  Visit Diagnosis: Localized edema  Leg wound, left, sequela  Leg wound, right, sequela    Problem List Patient Active Problem List   Diagnosis Date Noted  . Acute lower UTI 06/22/2019  . Hypokalemia 06/20/2019  . AKI (acute kidney injury) ([place]) 06/01/2019  . NASH (nonalcoholic steatohepatitis) 06/01/2019  . Pressure injury of skin 05/31/2019  . Candidiasis, intertrigo 05/31/2019  . Yeast infection 05/31/2019  . Fever 12/24/2018  . Hyponatremia 12/24/2018  . Abnormal liver function 12/24/2018  . Sepsis due to undetermined organism ([place]) 11/07/2018  . Atrial fibrillation ([place]) 11/07/2018  . COPD (chronic obstructive pulmonary disease) ([place]) 11/07/2018  . GERD (gastroesophageal reflux disease) 11/07/2018  . Hypothyroidism 11/07/2018  . Sleep apnea 11/07/2018  . Hepatic cirrhosis ([place]) 09/01/2014  . Cirrhosis, nonalcoholic ([place]) 92/06/69  . Incisional hernia 06/09/2014  . S/P complete repair of rotator cuff 07/24/2012  . Precordial pain 09/30/2010  .  Essential hypertension, benign 09/30/2010  . Type 2 diabetes mellitus ([place]) 09/05/2008  . OBESITY-MORBID (>100') 09/05/2008  . ESOPHAGEAL REFLUX 09/05/2008  . EDEMA 09/05/2008    Rayetta Humphrey, PT CLT [phone] 05/11/2020, 12:23 PM  [place] [address] [place], [place], [address] Phone: [phone]   Fax:  [phone]  Name: PAULYNE MOOTY MRN: [id number] Date of Birth: [birth year]

## 2020-05-13 ENCOUNTER — Encounter (HOSPITAL_COMMUNITY): Payer: Self-pay | Admitting: Physical Therapy

## 2020-05-13 NOTE — Therapy (Unsigned)
[place], [place], [address] Phone: [phone]   Fax:  [phone]  Patient Details  Name: [name] [name] MRN: [id number] Date of Birth: 04-01-1956 Referring Provider:  No ref. provider found  Encounter Date: 05/13/2020    Therapist sent order to [facility] for compression pump.  Rayetta Humphrey, PT CLT [phone] 05/13/2020, 10:23 AM  [place], [place], [address] Phone: [phone]   Fax:  [phone]

## 2020-05-18 ENCOUNTER — Ambulatory Visit (HOSPITAL_COMMUNITY): Payer: PPO | Admitting: Physical Therapy

## 2020-05-18 ENCOUNTER — Other Ambulatory Visit: Payer: Self-pay

## 2020-05-18 DIAGNOSIS — S81801S Unspecified open wound, right lower leg, sequela: Secondary | ICD-10-CM

## 2020-05-18 DIAGNOSIS — S81802S Unspecified open wound, left lower leg, sequela: Secondary | ICD-10-CM

## 2020-05-18 DIAGNOSIS — R600 Localized edema: Secondary | ICD-10-CM

## 2020-05-18 NOTE — Therapy (Signed)
[place], [place], [address] Phone: [phone]   Fax:  [phone]  Physical Therapy Treatment  Patient Details  Name: [name] [name] MRN: [id number] Date of Birth: [birth year] Referring Provider (PT): Coletta Memos   Encounter Date: 05/18/2020   PT End of Session - 05/18/20 1447    Visit Number 2    Number of Visits 4    Date for PT Re-Evaluation 06/10/20    Authorization Type [employer]    Authorization - Visit Number 2    Authorization - Number of Visits 4    Progress Note Due on Visit 4    PT Start Time 1400    PT Stop Time 1440    PT Time Calculation (min) 40 min           Past Medical History:  Diagnosis Date  . Anemia   . Anxiety   . Arthritis   . Asthma   . Atrial fibrillation ([place])   . Cirrhosis of liver ([place])   . COPD (chronic obstructive pulmonary disease) ([place])   . Coronary artery disease   . Depression   . Essential hypertension   . GERD (gastroesophageal reflux disease)   . Hypothyroidism   . Morbid obesity ([place])   . Overactive bladder   . Sleep apnea    CPAP - not consistent  . Thyroid cancer ([place])   . Type 2 diabetes mellitus ([place])     Past Surgical History:  Procedure Laterality Date  . "pump bumps"     bilateral heels--2000  . ABDOMINAL HYSTERECTOMY  1985  . CARDIAC CATHETERIZATION  2012   Dr. Lia Foyer told her nothing was wrong.  [name] [place]  . CHOLECYSTECTOMY  1996  . COLONOSCOPY WITH PROPOFOL N/A 04/17/2015   Procedure: COLONOSCOPY WITH PROPOFOL  at cecum at 0810; withdrawal time =15 minutes;  Surgeon: [name], MD;  [facility];  Service: Endoscopy;  Laterality: N/A;  . Debridement of abdominal wound  2011  . ESOPHAGEAL DILATION N/A 04/17/2015   Procedure: ESOPHAGEAL DILATION WITH 56FR MALONEY DILATOR;  Surgeon: [name], MD;  Location: [facility];  Service: Endoscopy;  Laterality: N/A;  . ESOPHAGOGASTRODUODENOSCOPY (EGD)  WITH PROPOFOL N/A 04/17/2015   Procedure: ESOPHAGOGASTRODUODENOSCOPY (EGD) WITH PROPOFOL;  Surgeon: [name], MD;  Location: [facility];  Service: Endoscopy;  Laterality: N/A;  . EXPLORATORY LAPAROTOMY  2003  . FOOT SURGERY Bilateral 2000   Bone spur removed  . FOOT SURGERY    . INCISIONAL HERNIA REPAIR N/A 06/09/2014   Procedure: RECURRENT  INCISIONAL HERNIORRHAPHY WITH MESH;  Surgeon: [name], MD;  Location: [facility];  Service: General;  Laterality: N/A;  . INCISIONAL HERNIA REPAIR N/A 10/15/2014   Procedure: [place];  Surgeon: [name], MD;  Location: [facility];  Service: General;  Laterality: N/A;  . INSERTION OF MESH N/A 06/09/2014   Procedure: INSERTION OF MESH;  Surgeon: [name], MD;  Location[facility];  Service: General;  Laterality: N/A;  . INSERTION OF MESH N/A 10/15/2014   Procedure: INSERTION OF MESH;  Surgeon: [name], MD;  Location: [facility];  Service: General;  Laterality: N/A;  . POLYPECTOMY  04/17/2015   Procedure: POLYPECTOMY;  Surgeon: [name], MD;  Location: [facility];  Service: Endoscopy;;  . RESECTION DISTAL CLAVICAL  05/07/2012   Procedure: RESECTION DISTAL CLAVICAL;  Surgeon: [name], MD;  Location: [facility];  Service:  Orthopedics;  Laterality: Right;  . SHOULDER OPEN ROTATOR CUFF REPAIR  05/07/2012   Procedure: ROTATOR CUFF REPAIR SHOULDER OPEN;  Surgeon: [name], MD;  Location: [facility];  Service: Orthopedics;  Laterality: Right;  . TEE WITHOUT CARDIOVERSION N/A 12/26/2018   Procedure: TRANSESOPHAGEAL ECHOCARDIOGRAM (TEE) WITH PROPOFOL;  Surgeon: Arnoldo Lenis, MD;  [facility];  Service: Endoscopy;  Laterality: N/A;  . THYROIDECTOMY  2009  . TONSILLECTOMY  1958  . UMBILICAL HERNIA REPAIR  2010    There were no vitals filed for this visit.                    Wound Therapy - 05/18/20 1440    Subjective Pt states she had to removed her bandages Friday due to  reaction to meds and had to get in the shower.  STates her legs are the smalledst they have been in a while.  Reports she has drainage from her belly.    Patient and Family Stated Goals wounds to heal, itching to stop     Date of Onset 03/20/20    Prior Treatments wound care, pt was urged to get compression garment to keep edema down but pt states her leg got better so she did not persue it.      Evaluation and Treatment Procedures Explained to Patient/Family Yes    Evaluation and Treatment Procedures agreed to    Wound Therapy - Clinical Statement Dressings removed. Multiple dried scabs on Rt LE but without drainage.  ONly one small area remaining on Lt lateral LE weeping.  Cleansed, massaged and moisturized LE.  REdressed with profore compression system.  pt encouaged to leave these on until next appointment as she is a weekly treat.  Pt will need to be measured next visit to order compression garments.     Wound Plan manual decongestive techniques, debridement as needed , compression bandaging.  Pt to be measured for compression garment once edema is down, obtain compression pump.     Dressing  xeroform to wounds, 4x4, netting , abpads to obtain a conial shape f/b application of profore compression bandaging system B.     Manual Therapy B from ankle to knee                    PT Education - 05/18/20 1447    Education Details need to keep compression on.  Suggested cotton to pad, absorb moisture in abdominal skin folds.    Person(s) Educated Patient    Methods Explanation    Comprehension Verbalized understanding            PT Short Term Goals - 05/11/20 1216      PT SHORT TERM GOAL #1   Title Pt to have 5 or less wounds on each leg and LT LE to no longer be weeping to decrease risk of infection    Time 2    Period Weeks    Status New    Target Date 05/25/20      PT SHORT TERM GOAL #2   Title PT to verbalize that she is not to scratch her legs under any circumstance as  this can cause wounds and infection.    Time 2    Period Weeks    Status New      PT SHORT TERM GOAL #3   Title PT to have been measured for her compression garment , pt already states she is going to obtain garments  from elastic therapy.    Time 2    Period Weeks    Status New             PT Long Term Goals - 05/11/20 1219      PT LONG TERM GOAL #1   Title PT to no longer have any wounds on her either LE    Time 4    Period Weeks    Status New    Target Date 06/08/20      PT LONG TERM GOAL #2   Title PT to have and be donning compression garment to prevent edema in LE    Time 4    Period Weeks    Status New      PT LONG TERM GOAL #3   Title PT to have obtained and be using compression pump to prevent edema and cellulitis    Time 4    Period Weeks    Status New                  Patient will benefit from skilled therapeutic intervention in order to improve the following deficits and impairments:     Visit Diagnosis: Localized edema  Leg wound, left, sequela  Leg wound, right, sequela     Problem List Patient Active Problem List   Diagnosis Date Noted  . Acute lower UTI 06/22/2019  . Hypokalemia 06/20/2019  . AKI (acute kidney injury) ([place]) 06/01/2019  . NASH (nonalcoholic steatohepatitis) 06/01/2019  . Pressure injury of skin 05/31/2019  . Candidiasis, intertrigo 05/31/2019  . Yeast infection 05/31/2019  . Fever 12/24/2018  . Hyponatremia 12/24/2018  . Abnormal liver function 12/24/2018  . Sepsis due to undetermined organism ([place]) 11/07/2018  . Atrial fibrillation ([place]) 11/07/2018  . COPD (chronic obstructive pulmonary disease) ([place]) 11/07/2018  . GERD (gastroesophageal reflux disease) 11/07/2018  . Hypothyroidism 11/07/2018  . Sleep apnea 11/07/2018  . Hepatic cirrhosis ([place]) 09/01/2014  . Cirrhosis, nonalcoholic ([place]) 44/73/9584  . Incisional hernia 06/09/2014  . S/P complete repair of rotator cuff 07/24/2012  . Precordial pain  09/30/2010  . Essential hypertension, benign 09/30/2010  . Type 2 diabetes mellitus ([place]) 09/05/2008  . OBESITY-MORBID (>100') 09/05/2008  . ESOPHAGEAL REFLUX 09/05/2008  . EDEMA 09/05/2008   Teena Irani, PTA/CLT [phone]  Teena Irani 05/18/2020, 2:48 PM  [place], [place], [address] Phone: [phone]   Fax:  [phone]  Name: [name] [name] MRN: [id number] Date of Birth: [birth year]

## 2020-05-19 DIAGNOSIS — M170 Bilateral primary osteoarthritis of knee: Secondary | ICD-10-CM | POA: Diagnosis not present

## 2020-05-19 DIAGNOSIS — E1140 Type 2 diabetes mellitus with diabetic neuropathy, unspecified: Secondary | ICD-10-CM | POA: Diagnosis not present

## 2020-05-19 DIAGNOSIS — E063 Autoimmune thyroiditis: Secondary | ICD-10-CM | POA: Diagnosis not present

## 2020-05-19 DIAGNOSIS — I872 Venous insufficiency (chronic) (peripheral): Secondary | ICD-10-CM | POA: Diagnosis not present

## 2020-05-22 ENCOUNTER — Other Ambulatory Visit: Payer: Self-pay | Admitting: Cardiology

## 2020-05-26 ENCOUNTER — Other Ambulatory Visit: Payer: Self-pay

## 2020-05-26 ENCOUNTER — Encounter (HOSPITAL_COMMUNITY): Payer: Self-pay

## 2020-05-26 ENCOUNTER — Ambulatory Visit (HOSPITAL_COMMUNITY): Payer: PPO | Attending: General Practice

## 2020-05-26 DIAGNOSIS — R600 Localized edema: Secondary | ICD-10-CM | POA: Insufficient documentation

## 2020-05-26 DIAGNOSIS — S81801S Unspecified open wound, right lower leg, sequela: Secondary | ICD-10-CM | POA: Insufficient documentation

## 2020-05-26 DIAGNOSIS — S81802S Unspecified open wound, left lower leg, sequela: Secondary | ICD-10-CM | POA: Insufficient documentation

## 2020-05-26 NOTE — Therapy (Signed)
[place], [place], [address] Phone: [phone]   Fax:  [phone]  Wound Care Therapy  Patient Details  Name: [name] [name] MRN: [id number] Date of Birth: 1955-07-08 Referring Provider (PT): Coletta Memos   Encounter Date: 05/26/2020   PT End of Session - 05/26/20 1652    Visit Number 3    Number of Visits 4    Date for PT Re-Evaluation 06/10/20    Authorization Type [employer]    Authorization - Visit Number 3    Authorization - Number of Visits 4    Progress Note Due on Visit 4    PT Start Time 6767    PT Stop Time 1620    PT Time Calculation (min) 47 min    Activity Tolerance Patient tolerated treatment well    Behavior During Therapy [facility] for tasks assessed/performed           Past Medical History:  Diagnosis Date  . Anemia   . Anxiety   . Arthritis   . Asthma   . Atrial fibrillation ([place])   . Cirrhosis of liver ([place])   . COPD (chronic obstructive pulmonary disease) ([place])   . Coronary artery disease   . Depression   . Essential hypertension   . GERD (gastroesophageal reflux disease)   . Hypothyroidism   . Morbid obesity ([place])   . Overactive bladder   . Sleep apnea    CPAP - not consistent  . Thyroid cancer ([place])   . Type 2 diabetes mellitus ([place])     Past Surgical History:  Procedure Laterality Date  . "pump bumps"     bilateral heels--2000  . ABDOMINAL HYSTERECTOMY  1985  . CARDIAC CATHETERIZATION  2012   Dr. Lia Foyer told her nothing was wrong.  [name] [place]  . CHOLECYSTECTOMY  1996  . COLONOSCOPY WITH PROPOFOL N/A 04/17/2015   Procedure: COLONOSCOPY WITH PROPOFOL  at cecum at 0810; withdrawal time =15 minutes;  Surgeon: [name], MD;  [facility]: [facility];  Service: Endoscopy;  Laterality: N/A;  . Debridement of abdominal wound  2011  . ESOPHAGEAL DILATION N/A 04/17/2015   Procedure: ESOPHAGEAL DILATION WITH 56FR MALONEY DILATOR;  Surgeon: [name], MD;  Location: [facility];  Service: Endoscopy;  Laterality: N/A;  . ESOPHAGOGASTRODUODENOSCOPY (EGD) WITH PROPOFOL N/A 04/17/2015   Procedure: ESOPHAGOGASTRODUODENOSCOPY (EGD) WITH PROPOFOL;  Surgeon: [name], MD;  Location: [facility];  Service: Endoscopy;  Laterality: N/A;  . EXPLORATORY LAPAROTOMY  2003  . FOOT SURGERY Bilateral 2000   Bone spur removed  . FOOT SURGERY    . INCISIONAL HERNIA REPAIR N/A 06/09/2014   Procedure: RECURRENT  INCISIONAL HERNIORRHAPHY WITH MESH;  Surgeon: [name], MD;  Location: [facility];  Service: General;  Laterality: N/A;  . INCISIONAL HERNIA REPAIR N/A 10/15/2014   Procedure: [place];  Surgeon: [name], MD;  Location: [place];  Service: General;  Laterality: N/A;  . INSERTION OF MESH N/A 06/09/2014   Procedure: INSERTION OF MESH;  Surgeon: [name], MD;  [facility];  Service: General;  Laterality: N/A;  . INSERTION OF MESH N/A 10/15/2014   Procedure: INSERTION OF MESH;  Surgeon: [name], MD;  Location: [place];  Service: General;  Laterality: N/A;  . POLYPECTOMY  04/17/2015   Procedure: POLYPECTOMY;  Surgeon: [name], MD;  Location: [facility];  Service: Endoscopy;;  . RESECTION DISTAL CLAVICAL  05/07/2012   Procedure: RESECTION DISTAL CLAVICAL;  Surgeon: [name], MD;  [facility]: [facility];  Service: Orthopedics;  Laterality: Right;  . SHOULDER OPEN ROTATOR CUFF REPAIR  05/07/2012   Procedure: ROTATOR CUFF REPAIR SHOULDER OPEN;  Surgeon: [name], MD;  [facility];  Service: Orthopedics;  Laterality: Right;  . TEE WITHOUT CARDIOVERSION N/A 12/26/2018   Procedure: TRANSESOPHAGEAL ECHOCARDIOGRAM (TEE) WITH PROPOFOL;  Surgeon: Arnoldo Lenis, MD;  [facility];  Service: Endoscopy;  Laterality: N/A;  . THYROIDECTOMY  2009  . TONSILLECTOMY  1958  . UMBILICAL HERNIA REPAIR  2010    There were no vitals filed for this visit.    Subjective Assessment -  05/26/20 1643    Subjective Pt arrived without dressings, reports she removed Sunday after getting wet and took shower earlier today.    Currently in Pain? Yes    Pain Score 9     Pain Location Knee    Pain Orientation Right    Pain Descriptors / Indicators Aching;Sore    Pain Type Chronic pain    Pain Onset More than a month ago    Pain Frequency Intermittent    Aggravating Factors  weight bearing                     Wound Therapy - 05/26/20 1643    Subjective Pt arrived without dressings, reports she removed Sunday after getting wet and took shower earlier today.    Patient and Family Stated Goals wounds to heal, itching to stop     Date of Onset 03/20/20    Prior Treatments wound care, pt was urged to get compression garment to keep edema down but pt states her leg got better so she did not persue it.      Pain Scale 0-10    Evaluation and Treatment Procedures Explained to Patient/Family Yes    Evaluation and Treatment Procedures agreed to    Wound Therapy - Clinical Statement Pt arrived without dressings, educated importance of keeping open wounds covered to reduce infection with verbalized understanding.  Manual decognitive lymphedema technqiues complete to assist with edema control.  Measurements complete for thigh high compression garments with handout given for [place] reports she received message from [facility] but deleted it.  Pt educated that [place] is concerning pump, pt given number to call back.  Lateral Lt LE continues to have weeping.  Cleansed and applied xeroform, ABD pad and profore applied.    Wound Therapy - Functional Problem List difficulty with dressing due to weeping of Lt LE     Factors Delaying/Impairing Wound Healing Altered sensation;Diabetes Mellitus;Infection - systemic/local;Immobility;Multiple medical problems;Polypharmacy;Vascular compromise    Hydrotherapy Plan Debridement;Dressing change;Patient/family education;Other  (comment)    Wound Therapy - Frequency --   1x/week for 4 weeks   Wound Therapy - Current Recommendations PT    Wound Plan manual decongestive techniques, debridement as needed , compression bandaging.  Answer any questions pertaining compression garment and f/u with call back to [facility] obtain compression pump.     Dressing  xeroform to wounds, 4x4, netting , abpads to obtain a conial shape f/b application of profore compression bandaging system B.     Manual Therapy BLE ankle to thigh anterior only                   PT Education - 05/26/20 1653    Education Details Measured for compression hose with  handout given.  Discussed purpose of [facility] concerning pump, number given to contact.    Methods Explanation;Handout    Comprehension Verbalized understanding            PT Short Term Goals - 05/11/20 1216      PT SHORT TERM GOAL #1   Title Pt to have 5 or less wounds on each leg and LT LE to no longer be weeping to decrease risk of infection    Time 2    Period Weeks    Status New    Target Date 05/25/20      PT SHORT TERM GOAL #2   Title PT to verbalize that she is not to scratch her legs under any circumstance as this can cause wounds and infection.    Time 2    Period Weeks    Status New      PT SHORT TERM GOAL #3   Title PT to have been measured for her compression garment , pt already states she is going to obtain garments from elastic therapy.    Time 2    Period Weeks    Status New             PT Long Term Goals - 05/11/20 1219      PT LONG TERM GOAL #1   Title PT to no longer have any wounds on her either LE    Time 4    Period Weeks    Status New    Target Date 06/08/20      PT LONG TERM GOAL #2   Title PT to have and be donning compression garment to prevent edema in LE    Time 4    Period Weeks    Status New      PT LONG TERM GOAL #3   Title PT to have obtained and be using compression pump to prevent edema and cellulitis    Time 4     Period Weeks    Status New                  Patient will benefit from skilled therapeutic intervention in order to improve the following deficits and impairments:     Visit Diagnosis: Localized edema  Leg wound, left, sequela  Leg wound, right, sequela     Problem List Patient Active Problem List   Diagnosis Date Noted  . Acute lower UTI 06/22/2019  . Hypokalemia 06/20/2019  . AKI (acute kidney injury) ([place]) 06/01/2019  . NASH (nonalcoholic steatohepatitis) 06/01/2019  . Pressure injury of skin 05/31/2019  . Candidiasis, intertrigo 05/31/2019  . Yeast infection 05/31/2019  . Fever 12/24/2018  . Hyponatremia 12/24/2018  . Abnormal liver function 12/24/2018  . Sepsis due to undetermined organism ([place]) 11/07/2018  . Atrial fibrillation ([place]) 11/07/2018  . COPD (chronic obstructive pulmonary disease) ([place]) 11/07/2018  . GERD (gastroesophageal reflux disease) 11/07/2018  . Hypothyroidism 11/07/2018  . Sleep apnea 11/07/2018  . Hepatic cirrhosis ([place]) 09/01/2014  . Cirrhosis, nonalcoholic ([place]) 82/99/3716  . Incisional hernia 06/09/2014  . S/P complete repair of rotator cuff 07/24/2012  . Precordial pain 09/30/2010  . Essential hypertension, benign 09/30/2010  . Type 2 diabetes mellitus ([place]) 09/05/2008  . OBESITY-MORBID (>100') 09/05/2008  . ESOPHAGEAL REFLUX 09/05/2008  . EDEMA 09/05/2008   Ihor Austin, LPTA/CLT; CBIS [phone]  Aldona Lento 05/26/2020, 4:55 PM  [place] [address] [place], [place], [address] Phone: [phone]  Fax:  [phone]  Name: [name] [name] MRN: [id number] Date of Birth: [birth year]

## 2020-06-02 ENCOUNTER — Other Ambulatory Visit: Payer: Self-pay

## 2020-06-02 ENCOUNTER — Ambulatory Visit (HOSPITAL_COMMUNITY): Payer: PPO | Admitting: Physical Therapy

## 2020-06-02 DIAGNOSIS — S81802S Unspecified open wound, left lower leg, sequela: Secondary | ICD-10-CM

## 2020-06-02 DIAGNOSIS — R600 Localized edema: Secondary | ICD-10-CM | POA: Diagnosis not present

## 2020-06-02 DIAGNOSIS — S81801S Unspecified open wound, right lower leg, sequela: Secondary | ICD-10-CM

## 2020-06-02 NOTE — Therapy (Addendum)
[facility] [address] [place], [place], [address] Phone: [phone]   Fax:  [phone]  Wound Care Therapy Progress Note Reporting Period 05/11/20  to  06/02/2020  See note below for Objective Data and Assessment of Progress/Goals.      Patient Details  Name: [name] [name] MRN: [id number] Date of Birth: 11/04/1955 Referring Provider (PT): Jesse, [place]   Encounter Date: 06/02/2020   PT End of Session - 06/02/20 1619    Visit Number 4    Number of Visits 10    Date for PT Re-Evaluation 07/07/20    Authorization Type [employer]    Authorization - Visit Number 4    Authorization - Number of Visits 10    Progress Note Due on Visit 14    PT Start Time 1530    PT Stop Time 1610    PT Time Calculation (min) 40 min    Activity Tolerance Patient tolerated treatment well    Behavior During Therapy [facility] for tasks assessed/performed           Past Medical History:  Diagnosis Date  . Anemia   . Anxiety   . Arthritis   . Asthma   . Atrial fibrillation ([facility])   . Cirrhosis of liver ([facility])   . COPD (chronic obstructive pulmonary disease) ([facility])   . Coronary artery disease   . Depression   . Essential hypertension   . GERD (gastroesophageal reflux disease)   . Hypothyroidism   . Morbid obesity ([facility])   . Overactive bladder   . Sleep apnea    CPAP - not consistent  . Thyroid cancer ([facility])   . Type 2 diabetes mellitus ([facility])     Past Surgical History:  Procedure Laterality Date  . "pump bumps"     bilateral heels--2000  . ABDOMINAL HYSTERECTOMY  1985  . CARDIAC CATHETERIZATION  2012   Dr. Stuckey told her nothing was wrong.  . CESAREAN SECTION  1977  . CHOLECYSTECTOMY  1996  . COLONOSCOPY WITH PROPOFOL N/A 04/17/2015   Procedure: COLONOSCOPY WITH PROPOFOL  at cecum at 0810; withdrawal time =15 minutes;  Surgeon: Najeeb U Rehman, MD;  [facility];  Service: Endoscopy;  Laterality: N/A;  . Debridement of  abdominal wound  2011  . ESOPHAGEAL DILATION N/A 04/17/2015   Procedure: ESOPHAGEAL DILATION WITH 56FR MALONEY DILATOR;  Surgeon: Najeeb U Rehman, MD;  [facility];  Service: Endoscopy;  Laterality: N/A;  . ESOPHAGOGASTRODUODENOSCOPY (EGD) WITH PROPOFOL N/A 04/17/2015   Procedure: ESOPHAGOGASTRODUODENOSCOPY (EGD) WITH PROPOFOL;  Surgeon: Najeeb U Rehman, MD;  [facility];  Service: Endoscopy;  Laterality: N/A;  . EXPLORATORY LAPAROTOMY  2003  . FOOT SURGERY Bilateral 2000   Bone spur removed  . FOOT SURGERY    . INCISIONAL HERNIA REPAIR N/A 06/09/2014   Procedure: RECURRENT  INCISIONAL HERNIORRHAPHY WITH MESH;  Surgeon: Mark A Jenkins, MD;  Location[facility];  Service: General;  Laterality: N/A;  . INCISIONAL HERNIA REPAIR N/A 10/15/2014   Procedure: LAPAROSCOPIC INCISIONAL HERNIA REPAIR;  Surgeon: Douglas Blackman, MD;  [facility];  Service: General;  Laterality: N/A;  . INSERTION OF MESH N/A 06/09/2014   Procedure: INSERTION OF MESH;  Surgeon: Mark A Jenkins, MD;  [facility];  Service: General;  Laterality: N/A;  . INSERTION OF MESH N/A 10/15/2014   Procedure: INSERTION OF MESH;  Surgeon: Douglas Blackman, MD;  [facility];  Service: General;  Laterality: N/A;  . POLYPECTOMY    04/17/2015   Procedure: POLYPECTOMY;  Surgeon: [name], MD;  [facility];  Service: Endoscopy;;  . RESECTION DISTAL CLAVICAL  05/07/2012   Procedure: RESECTION DISTAL CLAVICAL;  Surgeon: [name], MD;  [facility];  Service: Orthopedics;  Laterality: Right;  . SHOULDER OPEN ROTATOR CUFF REPAIR  05/07/2012   Procedure: ROTATOR CUFF REPAIR SHOULDER OPEN;  Surgeon: [name], MD;  [facility];  Service: Orthopedics;  Laterality: Right;  . TEE WITHOUT CARDIOVERSION N/A 12/26/2018   Procedure: TRANSESOPHAGEAL ECHOCARDIOGRAM (TEE) WITH PROPOFOL;  Surgeon: Arnoldo Lenis, MD;  [facility];  Service: Endoscopy;  Laterality: N/A;  . THYROIDECTOMY   2009  . TONSILLECTOMY  1958  . UMBILICAL HERNIA REPAIR  2010    There were no vitals filed for this visit.    Subjective Assessment - 06/02/20 1613    Subjective Pt states she removed her dressings on Thursday because she was having pain across her foot and has since drained a puddle from her Lt LE and her abdomen has doubled in size and making her off balance.               LYMPHEDEMA/ONCOLOGY QUESTIONNAIRE - 06/02/20 1626      What other symptoms do you have   Are you Having Heaviness or Tightness Yes    Are you having Pain No    Are you having pitting edema Yes    Body Site LE B     Is it Hard or Difficult finding clothes that fit Yes    Do you have infections Yes      Lymphedema Stage   Stage STAGE 2 SPONTANEOUSLY IRREVERSIBLE      Lymphedema Assessments   Lymphedema Assessments Lower extremities      Right Lower Extremity Lymphedema   30 cm Proximal to Suprapatella 50 cm   was 44.5 on 11/22   20 cm Proximal to Suprapatella 35.5 cm   was 31   10 cm Proximal to Suprapatella 23.5 cm   was 23     Left Lower Extremity Lymphedema   30 cm Proximal to Suprapatella 47 cm   was 41.7   20 cm Proximal to Suprapatella 31.5 cm   was 29.6   10 cm Proximal to Suprapatella 23.5 cm   was 22.8               Wound Therapy - 06/02/20 0001    Subjective Pt states she removed her dressings on Thursday because she was having pain across her foot and has since drained a puddle from her Lt LE and her abdomen has doubled in size and making her off balance.    Patient and Family Stated Goals wounds to heal, itching to stop     Date of Onset 03/20/20    Prior Treatments wound care, pt was urged to get compression garment to keep edema down but pt states her leg got better so she did not persue it.      Pain Scale 0-10    Pain Score 5     Pain Type Chronic pain    Pain Location Knee    Pain Orientation Right    Pain Descriptors / Indicators Aching;Sore    Evaluation and  Treatment Procedures Explained to Patient/Family Yes    Evaluation and Treatment Procedures agreed to    Wound Therapy - Clinical Statement Pt removed dressings after only 2 days of wear and has not had any compression  on her LE's for past 5 days.  Pt with several new areas where she scratched on lateral Lt LE and a few on Rt anterior LE.  Lt LE weaping moderately. Again, educated importance of keeping open wounds covered with compression to reduce infection and ability to scratch with verbalized understanding.  LE's measured with significant increase in fluid as compared to initial evaluation measurements a month ago. Noted increased abdominal edema and induration as well.  Educated to contact MD asap (states she has been too busy to do this).  Pt has ordered compression garments and received her pump today.  Pt has met 1/3 STG's and 1/3 LTG's and will continue to need skilled services until LE's decompress and wounds are healed. Due to increased edema recommend increasing treatment to two times a week; initially one time per pt request.   Bil LE's  Cleansed and applied xeroform, ABD pad and profore .    Wound Therapy - Functional Problem List difficulty with dressing due to weeping of Lt LE     Factors Delaying/Impairing Wound Healing Altered sensation;Diabetes Mellitus;Infection - systemic/local;Immobility;Multiple medical problems;Polypharmacy;Vascular compromise    Hydrotherapy Plan Debridement;Dressing change;Patient/family education;Other (comment)    Wound Therapy - Frequency 2X / week x 5 more weeks for a total of 5 weeks    Wound Therapy - Current Recommendations PT    Wound Plan appropriate dressing changes, compression to bilateral LE.  Transition to compression garments when wounds are healed.    Dressing  xeroform to wounds, 4x4, netting , abpads to obtain a conial shape f/b application of profore compression bandaging system B.                      PT Short Term Goals -  06/02/20 1628      PT SHORT TERM GOAL #1   Title Pt to have 5 or less wounds on each leg and LT LE to no longer be weeping to decrease risk of infection    Time 2    Period Weeks    Status On-going    Target Date 05/25/20      PT SHORT TERM GOAL #2   Title PT to verbalize that she is not to scratch her legs under any circumstance as this can cause wounds and infection.    Time 2    Period Weeks    Status Partially Met      PT SHORT TERM GOAL #3   Title PT to have been measured for her compression garment , pt already states she is going to obtain garments from elastic therapy.    Time 2    Period Weeks    Status Achieved             PT Long Term Goals - 06/02/20 1629      PT LONG TERM GOAL #1   Title PT to no longer have any wounds on her either LE    Time 4    Period Weeks    Status On-going      PT LONG TERM GOAL #2   Title PT to have and be donning compression garment to prevent edema in LE    Time 4    Period Weeks    Status On-going      PT LONG TERM GOAL #3   Title PT to have obtained and be using compression pump to prevent edema and cellulitis    Time 4    Period Weeks  Status Achieved                 Plan - 06/02/20 1627    Clinical Impression Statement see above; pt will benefit from continued woundcare with 2X week treatments rather than 1X week to ensure compression remains and wounds heal.    Personal Factors and Comorbidities Comorbidity 3+;Fitness;Time since onset of injury/illness/exacerbation    Comorbidities OA, cirrhosis of the liver, COPD, obesity, hx of cellulitis, DM    Examination-Activity Limitations Dressing    Examination-Participation Restrictions Shop    Stability/Clinical Decision Making Stable/Uncomplicated    Rehab Potential Good    PT Frequency 1x / week    PT Duration 4 weeks    PT Treatment/Interventions Manual techniques;Other (comment);Compression bandaging   debridement   PT Next Visit Plan assess wounds,  continue with manual for edema, once edema has stabilized measure for compression garment, see if order for pump has returned if so, send to [facility].           Patient will benefit from skilled therapeutic intervention in order to improve the following deficits and impairments:  Decreased skin integrity,Obesity,Increased edema  Visit Diagnosis: Localized edema  Leg wound, left, sequela  Leg wound, right, sequela     Problem List Patient Active Problem List   Diagnosis Date Noted  . Acute lower UTI 06/22/2019  . Hypokalemia 06/20/2019  . AKI (acute kidney injury) ([facility]) 06/01/2019  . NASH (nonalcoholic steatohepatitis) 06/01/2019  . Pressure injury of skin 05/31/2019  . Candidiasis, intertrigo 05/31/2019  . Yeast infection 05/31/2019  . Fever 12/24/2018  . Hyponatremia 12/24/2018  . Abnormal liver function 12/24/2018  . Sepsis due to undetermined organism (HCC) 11/07/2018  . Atrial fibrillation (HCC) 11/07/2018  . COPD (chronic obstructive pulmonary disease) (HCC) 11/07/2018  . GERD (gastroesophageal reflux disease) 11/07/2018  . Hypothyroidism 11/07/2018  . Sleep apnea 11/07/2018  . Hepatic cirrhosis (HCC) 09/01/2014  . Cirrhosis, nonalcoholic (HCC) 09/01/2014  . Incisional hernia 06/09/2014  . S/P complete repair of rotator cuff 07/24/2012  . Precordial pain 09/30/2010  . Essential hypertension, benign 09/30/2010  . Type 2 diabetes mellitus (HCC) 09/05/2008  . OBESITY-MORBID (>100') 09/05/2008  . ESOPHAGEAL REFLUX 09/05/2008  . EDEMA 09/05/2008   Amy B Frazier, PTA/CLT [phone]  Frazier, Amy B 06/02/2020, 4:42 PM  [facility] [address] [place], [place], [address] Phone: [phone]   Fax:  [phone]  Name: [name] [name] MRN: [id number] Date of Birth: 08/05/1955    

## 2020-06-03 NOTE — Addendum Note (Signed)
Addended by: Leeroy Cha on: 06/03/2020 09:04 AM   Modules accepted: Orders

## 2020-06-04 ENCOUNTER — Other Ambulatory Visit: Payer: Self-pay

## 2020-06-04 ENCOUNTER — Ambulatory Visit (HOSPITAL_COMMUNITY): Payer: PPO

## 2020-06-04 ENCOUNTER — Encounter (HOSPITAL_COMMUNITY): Payer: Self-pay

## 2020-06-04 DIAGNOSIS — S81802S Unspecified open wound, left lower leg, sequela: Secondary | ICD-10-CM

## 2020-06-04 DIAGNOSIS — K660 Peritoneal adhesions (postprocedural) (postinfection): Secondary | ICD-10-CM | POA: Diagnosis not present

## 2020-06-04 DIAGNOSIS — R600 Localized edema: Secondary | ICD-10-CM

## 2020-06-04 DIAGNOSIS — S81801S Unspecified open wound, right lower leg, sequela: Secondary | ICD-10-CM

## 2020-06-04 NOTE — Therapy (Signed)
[place], [place], [address] Phone: [phone]   Fax:  [phone]  Wound Care Therapy  Patient Details  Name: [name] [name] MRN: [id number] Date of Birth: 22-May-1956 Referring Provider (PT): Coletta Memos   Encounter Date: 06/04/2020   PT End of Session - 06/04/20 1610    Visit Number 5    Number of Visits 10    Date for PT Re-Evaluation 07/07/20    Authorization Type [employer]    Authorization - Visit Number 5    Authorization - Number of Visits 10    Progress Note Due on Visit 14    PT Start Time 1510    PT Stop Time 1607    PT Time Calculation (min) 57 min    Activity Tolerance Patient tolerated treatment well    Behavior During Therapy [facility] for tasks assessed/performed           Past Medical History:  Diagnosis Date  . Anemia   . Anxiety   . Arthritis   . Asthma   . Atrial fibrillation ([place])   . Cirrhosis of liver ([place])   . COPD (chronic obstructive pulmonary disease) ([place])   . Coronary artery disease   . Depression   . Essential hypertension   . GERD (gastroesophageal reflux disease)   . Hypothyroidism   . Morbid obesity ([place])   . Overactive bladder   . Sleep apnea    CPAP - not consistent  . Thyroid cancer ([place])   . Type 2 diabetes mellitus ([place])     Past Surgical History:  Procedure Laterality Date  . "pump bumps"     bilateral heels--2000  . ABDOMINAL HYSTERECTOMY  1985  . CARDIAC CATHETERIZATION  2012   Dr. Lia Foyer told her nothing was wrong.  [name] [place]  . CHOLECYSTECTOMY  1996  . COLONOSCOPY WITH PROPOFOL N/A 04/17/2015   Procedure: COLONOSCOPY WITH PROPOFOL  at cecum at 0810; withdrawal time =15 minutes;  Surgeon: [name], MD;  [facility];  Service: Endoscopy;  Laterality: N/A;  . Debridement of abdominal wound  2011  . ESOPHAGEAL DILATION N/A 04/17/2015   Procedure: ESOPHAGEAL DILATION WITH 56FR MALONEY DILATOR;  Surgeon: [name], MD;  [facility]: [facility];  Service: Endoscopy;  Laterality: N/A;  . ESOPHAGOGASTRODUODENOSCOPY (EGD) WITH PROPOFOL N/A 04/17/2015   Procedure: ESOPHAGOGASTRODUODENOSCOPY (EGD) WITH PROPOFOL;  Surgeon: [name], MD;  [facility]: [facility];  Service: Endoscopy;  Laterality: N/A;  . EXPLORATORY LAPAROTOMY  2003  . FOOT SURGERY Bilateral 2000   Bone spur removed  . FOOT SURGERY    . INCISIONAL HERNIA REPAIR N/A 06/09/2014   Procedure: RECURRENT  INCISIONAL HERNIORRHAPHY WITH MESH;  Surgeon: [name], MD;  Location: [facility];  Service: General;  Laterality: N/A;  . INCISIONAL HERNIA REPAIR N/A 10/15/2014   Procedure: [place];  Surgeon: [name], MD;  Location: [place];  Service: General;  Laterality: N/A;  . INSERTION OF MESH N/A 06/09/2014   Procedure: INSERTION OF MESH;  Surgeon: [name], MD;  [facility];  Service: General;  Laterality: N/A;  . INSERTION OF MESH N/A 10/15/2014   Procedure: INSERTION OF MESH;  Surgeon: [name], MD;  Location: [place];  Service: General;  Laterality: N/A;  . POLYPECTOMY  04/17/2015   Procedure: POLYPECTOMY;  Surgeon: [name], MD;  [facility];  Service: Endoscopy;;  . RESECTION DISTAL CLAVICAL  05/07/2012   Procedure: RESECTION DISTAL CLAVICAL;  Surgeon: [name], MD;  [facility];  Service: Orthopedics;  Laterality: Right;  . SHOULDER OPEN ROTATOR CUFF REPAIR  05/07/2012   Procedure: ROTATOR CUFF REPAIR SHOULDER OPEN;  Surgeon: [name], MD;  [facility];  Service: Orthopedics;  Laterality: Right;  . TEE WITHOUT CARDIOVERSION N/A 12/26/2018   Procedure: TRANSESOPHAGEAL ECHOCARDIOGRAM (TEE) WITH PROPOFOL;  Surgeon: Arnoldo Lenis, MD;  [facility];  Service: Endoscopy;  Laterality: N/A;  . THYROIDECTOMY  2009  . TONSILLECTOMY  1958  . UMBILICAL HERNIA REPAIR  2010    There were no vitals filed for this visit.    Subjective Assessment -  06/04/20 1608    Subjective Pt reports she called MD about her abdominal, stated it has gone down.  MD stated to call him for a catscan if returns.  No reports of pain.  Reports new wound above incision on Lt LE with some wheeping. Reports she called and placed order for compression hose to knee.    Currently in Pain? No/denies                     Wound Therapy - 06/04/20 0001    Subjective Pt reports she called MD about her abdominal, stated it has gone down.  MD stated to call him for a catscan if returns.  No reports of pain.  Reports new wound above incision on Lt LE with some wheeping. Reports she called and placed order for compression hose to knee.    Patient and Family Stated Goals wounds to heal, itching to stop     Date of Onset 03/20/20    Prior Treatments wound care, pt was urged to get compression garment to keep edema down but pt states her leg got better so she did not persue it.      Pain Scale 0-10    Pain Score 0-No pain    Evaluation and Treatment Procedures Explained to Patient/Family Yes    Evaluation and Treatment Procedures agreed to    Wound Therapy - Clinical Statement Pt arrived with dressings intact.  New wound with wheeping superior to dressings on Lt LE.  Manual decognestive lymph techniques complete to address edema present BLE.  Noted increased swelling dorsal aspect Lt LE.  LE cleansed, profore covered dorsal aspect and superior wound, reports of comfort at EOS.    Wound Therapy - Functional Problem List difficulty with dressing due to weeping of Lt LE     Factors Delaying/Impairing Wound Healing Altered sensation;Diabetes Mellitus;Infection - systemic/local;Immobility;Multiple medical problems;Polypharmacy;Vascular compromise    Hydrotherapy Plan Debridement;Dressing change;Patient/family education;Other (comment)    Wound Therapy - Frequency 2X / week    Wound Therapy - Current Recommendations PT    Wound Plan appropriate dressing changes, compression  to bilateral LE.  Transition to compression garments when wounds are healed.    Dressing  xeroform to wounds, 4x4, netting , abpads to obtain a conial shape f/b application of profore compression bandaging system B.                      PT Short Term Goals - 06/02/20 1628      PT SHORT TERM GOAL #1   Title Pt to have 5 or less wounds on each leg and LT LE to no longer be weeping to decrease risk of infection    Time 2    Period Weeks  Status On-going    Target Date 05/25/20      PT SHORT TERM GOAL #2   Title PT to verbalize that she is not to scratch her legs under any circumstance as this can cause wounds and infection.    Time 2    Period Weeks    Status Partially Met      PT SHORT TERM GOAL #3   Title PT to have been measured for her compression garment , pt already states she is going to obtain garments from elastic therapy.    Time 2    Period Weeks    Status Achieved             PT Long Term Goals - 06/02/20 1629      PT LONG TERM GOAL #1   Title PT to no longer have any wounds on her either LE    Time 4    Period Weeks    Status On-going      PT LONG TERM GOAL #2   Title PT to have and be donning compression garment to prevent edema in LE    Time 4    Period Weeks    Status On-going      PT LONG TERM GOAL #3   Title PT to have obtained and be using compression pump to prevent edema and cellulitis    Time 4    Period Weeks    Status Achieved                  Patient will benefit from skilled therapeutic intervention in order to improve the following deficits and impairments:     Visit Diagnosis: Localized edema  Leg wound, left, sequela  Leg wound, right, sequela     Problem List Patient Active Problem List   Diagnosis Date Noted  . Acute lower UTI 06/22/2019  . Hypokalemia 06/20/2019  . AKI (acute kidney injury) ([place]) 06/01/2019  . NASH (nonalcoholic steatohepatitis) 06/01/2019  . Pressure injury of skin 05/31/2019   . Candidiasis, intertrigo 05/31/2019  . Yeast infection 05/31/2019  . Fever 12/24/2018  . Hyponatremia 12/24/2018  . Abnormal liver function 12/24/2018  . Sepsis due to undetermined organism ([place]) 11/07/2018  . Atrial fibrillation ([place]) 11/07/2018  . COPD (chronic obstructive pulmonary disease) ([place]) 11/07/2018  . GERD (gastroesophageal reflux disease) 11/07/2018  . Hypothyroidism 11/07/2018  . Sleep apnea 11/07/2018  . Hepatic cirrhosis ([place]) 09/01/2014  . Cirrhosis, nonalcoholic ([place]) 74/05/8785  . Incisional hernia 06/09/2014  . S/P complete repair of rotator cuff 07/24/2012  . Precordial pain 09/30/2010  . Essential hypertension, benign 09/30/2010  . Type 2 diabetes mellitus ([place]) 09/05/2008  . OBESITY-MORBID (>100') 09/05/2008  . ESOPHAGEAL REFLUX 09/05/2008  . EDEMA 09/05/2008   Ihor Austin, LPTA/CLT; CBIS [phone]  Aldona Lento 06/04/2020, 5:35 PM  [place], [place], [address] Phone: [phone]   Fax:  [phone]  Name: [name] [name] MRN: [id number] Date of Birth: [birth year]

## 2020-06-07 NOTE — Progress Notes (Deleted)
Cardiology Office Note   Date:  06/07/2020   ID:  [name], [name] January 27, 1956, MRN [id number]  PCP:  Redmond School, MD  Cardiologist:  Dr. Domenic Polite    No chief complaint on file.     History of Present Illness: [name] [name] is a 64 y.o. female who presents for *** HTN  essential hypertension, atrial fibrillation, COPD, sleep apnea, nonalcoholic steatohepatitis, GERD, type 2 diabetes, hypothyroidism, AKI, precordial pain, and hypokalemia.  On last visit 05/08/20 she had lower ext edema and support stocking info given.  She had been in skilled facility for rehab and on last visit was out and looking for assisted living.  She continues with a fib on eliquis  With elevated BP furosemide was started.     Today *** Past Medical History:  Diagnosis Date  . Anemia   . Anxiety   . Arthritis   . Asthma   . Atrial fibrillation ([place])   . Cirrhosis of liver ([place])   . COPD (chronic obstructive pulmonary disease) ([place])   . Coronary artery disease   . Depression   . Essential hypertension   . GERD (gastroesophageal reflux disease)   . Hypothyroidism   . Morbid obesity ([place])   . Overactive bladder   . Sleep apnea    CPAP - not consistent  . Thyroid cancer ([place])   . Type 2 diabetes mellitus ([place])     Past Surgical History:  Procedure Laterality Date  . "pump bumps"     bilateral heels--2000  . ABDOMINAL HYSTERECTOMY  1985  . CARDIAC CATHETERIZATION  2012   Dr. Lia Foyer told her nothing was wrong.  [name] [name] [address]  . CHOLECYSTECTOMY  1996  . COLONOSCOPY WITH PROPOFOL N/A 04/17/2015   Procedure: COLONOSCOPY WITH PROPOFOL  at cecum at 0810; withdrawal time =15 minutes;  Surgeon: [name], MD;  [facility];  Service: Endoscopy;  Laterality: N/A;  . Debridement of abdominal wound  2011  . ESOPHAGEAL DILATION N/A 04/17/2015   Procedure: ESOPHAGEAL DILATION WITH 56FR MALONEY DILATOR;  Surgeon: [name], MD;  [facility];  Service:  Endoscopy;  Laterality: N/A;  . ESOPHAGOGASTRODUODENOSCOPY (EGD) WITH PROPOFOL N/A 04/17/2015   Procedure: ESOPHAGOGASTRODUODENOSCOPY (EGD) WITH PROPOFOL;  Surgeon: [name], MD;  [facility];  Service: Endoscopy;  Laterality: N/A;  . EXPLORATORY LAPAROTOMY  2003  . FOOT SURGERY Bilateral 2000   Bone spur removed  . FOOT SURGERY    . INCISIONAL HERNIA REPAIR N/A 06/09/2014   Procedure: RECURRENT  INCISIONAL HERNIORRHAPHY WITH MESH;  Surgeon: [name], MD;  Location[facility];  Service: General;  Laterality: N/A;  . INCISIONAL HERNIA REPAIR N/A 10/15/2014   Procedure: [place];  Surgeon: [name], MD;  Location: [facility] [place];  Service: General;  Laterality: N/A;  . INSERTION OF MESH N/A 06/09/2014   Procedure: INSERTION OF MESH;  Surgeon: [name], MD;  Location: [facility];  Service: General;  Laterality: N/A;  . INSERTION OF MESH N/A 10/15/2014   Procedure: INSERTION OF MESH;  Surgeon: [name], MD;  Location: [facility];  Service: General;  Laterality: N/A;  . POLYPECTOMY  04/17/2015   Procedure: POLYPECTOMY;  Surgeon: [name], MD;  [facility];  Service: Endoscopy;;  . RESECTION DISTAL CLAVICAL  05/07/2012   Procedure: RESECTION DISTAL CLAVICAL;  Surgeon: [name], MD;  [facility]: [facility];  Service: Orthopedics;  Laterality: Right;  . SHOULDER OPEN ROTATOR  CUFF REPAIR  05/07/2012   Procedure: ROTATOR CUFF REPAIR SHOULDER OPEN;  Surgeon: [name], MD;  [facility]: [facility];  Service: Orthopedics;  Laterality: Right;  . TEE WITHOUT CARDIOVERSION N/A 12/26/2018   Procedure: TRANSESOPHAGEAL ECHOCARDIOGRAM (TEE) WITH PROPOFOL;  Surgeon: Arnoldo Lenis, MD;  [facility];  Service: Endoscopy;  Laterality: N/A;  . THYROIDECTOMY  2009  . TONSILLECTOMY  1958  . UMBILICAL HERNIA REPAIR  2010     Current Outpatient Medications  Medication Sig Dispense Refill  . acetaminophen (TYLENOL) 325 MG tablet Take 2  tablets (650 mg total) by mouth every 6 (six) hours as needed for mild pain (or Fever >/= 101). 12 tablet 0  . albuterol (PROVENTIL HFA) 108 (90 BASE) MCG/ACT inhaler Inhale 2 puffs into the lungs every 6 (six) hours as needed for wheezing or shortness of breath. Shortness of breath    . amLODipine (NORVASC) 10 MG tablet Take 10 mg by mouth daily.     [name] [name] apixaban (ELIQUIS) 5 MG TABS tablet Take 1 tablet (5 mg total) by mouth 2 (two) times daily. 180 tablet 3  . celecoxib (CELEBREX) 200 MG capsule Take 200-400 mg by mouth See admin instructions. 1 tablet daily, may take an extra if needed    . colesevelam (WELCHOL) 625 MG tablet Take 1,875 mg by mouth as directed. 3 tabs am 3 tabs pm    . diltiazem (TIAZAC) 360 MG 24 hr capsule TAKE ONE CAPSULE (360MG TOTAL) BY MOUTH DAILY (Patient taking differently: Take 360 mg by mouth daily. ) 90 capsule 3  . DULoxetine (CYMBALTA) 60 MG capsule Take 120 mg by mouth daily.   4  . econazole nitrate 1 % cream Apply topically daily. (Patient taking differently: Apply 1 application topically daily. ) 85 g 0  . fluorometholone (FML) 0.1 % ophthalmic suspension Place 1 drop into both eyes 3 (three) times daily.     . furosemide (LASIX) 40 MG tablet Take 40 mg by mouth daily as needed for fluid.     [name] [name] gabapentin (NEURONTIN) 300 MG capsule Take 300 mg by mouth daily.    [name] [name] HYDROcodone-acetaminophen (NORCO/VICODIN) 5-325 MG tablet Take 1 tablet by mouth every 6 (six) hours as needed for moderate pain or severe pain. 12 tablet 0  . hydrOXYzine (ATARAX/VISTARIL) 25 MG tablet Take 25 mg by mouth 4 (four) times daily as needed for itching.     . insulin aspart (NOVOLOG) 100 UNIT/ML injection insulin aspart (novoLOG) injection 0-9 Units 0-9 Units Subcutaneous, 3 times daily with meals CBG < 70: Implement Hypoglycemia Standing Orders and refer to Hypoglycemia Standing Orders sidebar report  CBG 70 - 120: 0 units  CBG 121 - 150: 0 unit CBG 151 - 200: 2 units CBG 201 - 250: 3 units CBG  251 - 300: 5 units CBG 301 - 350: 7 units  CBG 351 - 400: 9 units CBG > 400: 10 units 10 mL 3  . LANTUS SOLOSTAR 100 UNIT/ML Solostar Pen Inject 30 Units into the skin at bedtime.     [name] [name] levothyroxine (SYNTHROID) 300 MCG tablet Take 300 mcg by mouth daily.    . magnesium oxide (MAG-OX) 400 (241.3 Mg) MG tablet Take 1 tablet (400 mg total) by mouth daily. 30 tablet 0  . methocarbamol (ROBAXIN) 500 MG tablet Take 1 tablet (500 mg total) by mouth every 6 (six) hours as needed for muscle spasms. 20 tablet 0  . nystatin cream (MYCOSTATIN) Apply to affected area 2 times  daily 30 g 5  . potassium chloride SA (K-DUR,KLOR-CON) 20 MEQ tablet Take 20 mEq by mouth 2 (two) times daily.     . pravastatin (PRAVACHOL) 20 MG tablet Take 20 mg by mouth daily.    [name] [name] senna-docusate (SENOKOT-S) 8.6-50 MG tablet Take 2 tablets by mouth at bedtime. 60 tablet 1  . traZODone (DESYREL) 150 MG tablet Take 1 tablet (150 mg total) by mouth at bedtime. (Patient taking differently: Take 150 mg by mouth at bedtime as needed for sleep. ) 30 tablet 3  . Vitamin D, Ergocalciferol, (DRISDOL) 1.25 MG (50000 UNIT) CAPS capsule Take 50,000 Units by mouth once a week.     No current facility-administered medications for this visit.    Allergies:   Doxycycline, Adhesive [tape], Biaxin [clarithromycin], Percocet [oxycodone-acetaminophen], Xarelto [rivaroxaban], Clarithromycin, Clindamycin/lincomycin, Neosporin [neomycin-polymyxin b gu], and Penicillins    Social History:  The patient  reports that she has never smoked. She has never used smokeless tobacco. She reports that she does not drink alcohol and does not use drugs.   Family History:  The patient's ***family history includes Arthritis in an other family member; Asthma in an other family member; Cancer in an other family member; Diabetes in an other family member; Heart disease in an other family member; Kidney disease in an other family member.    ROS:  General:no colds or  fevers, no weight changes Skin:no rashes or ulcers HEENT:no blurred vision, no congestion CV:see HPI PUL:see HPI GI:no diarrhea constipation or melena, no indigestion GU:no hematuria, no dysuria MS:no joint pain, no claudication Neuro:no syncope, no lightheadedness Endo:no diabetes, no thyroid disease Wt Readings from Last 3 Encounters:  05/08/20 253 lb (114.8 kg)  04/12/20 260 lb (117.9 kg)  06/07/19 294 lb (133.4 kg)     PHYSICAL EXAM: VS:  There were no vitals taken for this visit. , BMI There is no height or weight on file to calculate BMI. General:Pleasant affect, NAD Skin:Warm and dry, brisk capillary refill HEENT:normocephalic, sclera clear, mucus membranes moist Neck:supple, no JVD, no bruits  Heart:S1S2 RRR without murmur, [place], rub or click Lungs:clear without rales, rhonchi, or wheezes OZD:GUYQ, non tender, + BS, do not palpate liver spleen or masses Ext:no lower ext edema, 2+ pedal pulses, 2+ radial pulses Neuro:alert and oriented, MAE, follows commands, + facial symmetry    EKG:  EKG is ordered today. The ekg ordered today demonstrates ***   Recent Labs: 04/12/2020: ALT 16; BUN 9; Creatinine, Ser 0.72; Hemoglobin 9.0; Magnesium 1.7; Platelets 209; Potassium 3.1; Sodium 136; TSH 4.137    Lipid Panel No results found for: CHOL, TRIG, HDL, CHOLHDL, VLDL, LDLCALC, LDLDIRECT     Other studies Reviewed: Additional studies/ records that were reviewed today include: ***.   ASSESSMENT AND PLAN:  1.  ***   Current medicines are reviewed with the patient today.  The patient Has no concerns regarding medicines.  The following changes have been made:  See above Labs/ tests ordered today include:see above  Disposition:   FU:  see above  Signed, Cecilie Kicks, NP  06/07/2020 9:43 PM    [facility] [place], [place], [place], [place] Phone: [phone]; Fax: [phone]

## 2020-06-08 ENCOUNTER — Ambulatory Visit: Payer: PPO | Admitting: Cardiology

## 2020-06-09 ENCOUNTER — Other Ambulatory Visit: Payer: Self-pay

## 2020-06-09 ENCOUNTER — Ambulatory Visit (HOSPITAL_COMMUNITY): Payer: PPO

## 2020-06-09 ENCOUNTER — Encounter (HOSPITAL_COMMUNITY): Payer: Self-pay

## 2020-06-09 DIAGNOSIS — S81802S Unspecified open wound, left lower leg, sequela: Secondary | ICD-10-CM

## 2020-06-09 DIAGNOSIS — R600 Localized edema: Secondary | ICD-10-CM | POA: Diagnosis not present

## 2020-06-09 DIAGNOSIS — S81801S Unspecified open wound, right lower leg, sequela: Secondary | ICD-10-CM

## 2020-06-09 NOTE — Therapy (Signed)
[place], [place], [address] Phone: [phone]   Fax:  [phone]  Wound Care Therapy  Patient Details  Name: [name] [name] MRN: [id number] Date of Birth: 1955/07/30 Referring Provider (PT): Coletta Memos   Encounter Date: 06/09/2020   PT End of Session - 06/09/20 1357    Visit Number 6    Number of Visits 10    Date for PT Re-Evaluation 07/07/20    Authorization Type [employer]    Authorization - Visit Number 6    Authorization - Number of Visits 10    Progress Note Due on Visit 14    PT Start Time 6579    PT Stop Time 1348    PT Time Calculation (min) 25 min    Activity Tolerance Patient tolerated treatment well    Behavior During Therapy [facility] for tasks assessed/performed           Past Medical History:  Diagnosis Date  . Anemia   . Anxiety   . Arthritis   . Asthma   . Atrial fibrillation ([place])   . Cirrhosis of liver ([place])   . COPD (chronic obstructive pulmonary disease) ([place])   . Coronary artery disease   . Depression   . Essential hypertension   . GERD (gastroesophageal reflux disease)   . Hypothyroidism   . Morbid obesity ([place])   . Overactive bladder   . Sleep apnea    CPAP - not consistent  . Thyroid cancer ([place])   . Type 2 diabetes mellitus ([place])     Past Surgical History:  Procedure Laterality Date  . "pump bumps"     bilateral heels--2000  . ABDOMINAL HYSTERECTOMY  1985  . CARDIAC CATHETERIZATION  2012   Dr. Lia Foyer told her nothing was wrong.  [name] [place]  . CHOLECYSTECTOMY  1996  . COLONOSCOPY WITH PROPOFOL N/A 04/17/2015   Procedure: COLONOSCOPY WITH PROPOFOL  at cecum at 0810; withdrawal time =15 minutes;  Surgeon: [name], MD;  Location: [facility];  Service: Endoscopy;  Laterality: N/A;  . Debridement of abdominal wound  2011  . ESOPHAGEAL DILATION N/A 04/17/2015   Procedure: ESOPHAGEAL DILATION WITH 56FR MALONEY DILATOR;  Surgeon: [name], MD;  Location: [facility];  Service: Endoscopy;  Laterality: N/A;  . ESOPHAGOGASTRODUODENOSCOPY (EGD) WITH PROPOFOL N/A 04/17/2015   Procedure: ESOPHAGOGASTRODUODENOSCOPY (EGD) WITH PROPOFOL;  Surgeon: [name], MD;  Location: [facility];  Service: Endoscopy;  Laterality: N/A;  . EXPLORATORY LAPAROTOMY  2003  . FOOT SURGERY Bilateral 2000   Bone spur removed  . FOOT SURGERY    . INCISIONAL HERNIA REPAIR N/A 06/09/2014   Procedure: RECURRENT  INCISIONAL HERNIORRHAPHY WITH MESH;  Surgeon: [name], MD;  Location: [facility];  Service: General;  Laterality: N/A;  . INCISIONAL HERNIA REPAIR N/A 10/15/2014   Procedure: [place];  Surgeon: [name], MD;  Location: [place];  Service: General;  Laterality: N/A;  . INSERTION OF MESH N/A 06/09/2014   Procedure: INSERTION OF MESH;  Surgeon: [name], MD;  Location: [facility];  Service: General;  Laterality: N/A;  . INSERTION OF MESH N/A 10/15/2014   Procedure: INSERTION OF MESH;  Surgeon: [name], MD;  Location: [facility];  Service: General;  Laterality: N/A;  . POLYPECTOMY  04/17/2015   Procedure: POLYPECTOMY;  Surgeon: [name], MD;  Location: [facility];  Service: Endoscopy;;  . RESECTION DISTAL CLAVICAL  05/07/2012   Procedure: RESECTION DISTAL CLAVICAL;  Surgeon: [name], MD;  Location: [facility];  Service: Orthopedics;  Laterality: Right;  . SHOULDER OPEN ROTATOR CUFF REPAIR  05/07/2012   Procedure: ROTATOR CUFF REPAIR SHOULDER OPEN;  Surgeon: [name], MD;  Location: [facility];  Service: Orthopedics;  Laterality: Right;  . TEE WITHOUT CARDIOVERSION N/A 12/26/2018   Procedure: TRANSESOPHAGEAL ECHOCARDIOGRAM (TEE) WITH PROPOFOL;  Surgeon: Arnoldo Lenis, MD;  [facility];  Service: Endoscopy;  Laterality: N/A;  . THYROIDECTOMY  2009  . TONSILLECTOMY  1958  . UMBILICAL HERNIA REPAIR  2010    There were no vitals filed for this visit.    Subjective Assessment -  06/09/20 1348    Subjective Pt stated her abdominal region size of basketball on Rt side.  Reports she has received her compression hose in the mail.  Removed dressings on Sunday for MD apt on Monday that MD apt that got canceled, arrived with no dressings on LE.               LYMPHEDEMA/ONCOLOGY QUESTIONNAIRE - 06/09/20 0001      Right Lower Extremity Lymphedema   30 cm Proximal to Suprapatella 46.7 cm   was 50   20 cm Proximal to Suprapatella 34 cm   was 35.5   10 cm Proximal to Suprapatella 23.4 cm   was 23.5     Left Lower Extremity Lymphedema   30 cm Proximal to Suprapatella 41 cm   was 47   20 cm Proximal to Suprapatella 34 cm   was 31.5   10 cm Proximal to Suprapatella 23.5 cm   was 23.5               Wound Therapy - 06/09/20 0001    Subjective Pt stated her abdominal region size of basketball on Rt side.  Reports she has received her compression hose in the mail.  Removed dressings on Sunday for MD apt on Monday that MD apt that got canceled, arrived with no dressings on LE.    Patient and Family Stated Goals wounds to heal, itching to stop     Date of Onset 03/20/20    Prior Treatments wound care, pt was urged to get compression garment to keep edema down but pt states her leg got better so she did not persue it.      Pain Scale 0-10    Pain Score 0-No pain    Evaluation and Treatment Procedures Explained to Patient/Family Yes    Evaluation and Treatment Procedures agreed to    Wound Therapy - Clinical Statement Pt arrived with new compression garments.  No wounds present on LE except for 2 spots that do not require skilled intervention.  Educated on proper technqiue to donn/doff dressings, cleasing garments as well as self care for LE.  Pt able to verbalize understanding and donn garments indepdently without assistance.  Reviewe importance of wearing compression garments to reduce edema and wounds as well as importance of no stratching.    Wound Therapy - Functional  Problem List difficulty with dressing due to weeping of Lt LE     Factors Delaying/Impairing Wound Healing Altered sensation;Diabetes Mellitus;Infection - systemic/local;Immobility;Multiple medical problems;Polypharmacy;Vascular compromise    Hydrotherapy Plan Debridement;Dressing change;Patient/family education;Other (comment)    Wound Therapy - Frequency 2X / week    Wound Therapy - Current Recommendations PT    Wound Plan DC to self care.    Dressing  Compression garments  PT Education - 06/09/20 1657    Education Details No wounds presents.  Educated donn/doffing compression garments.  Importance of wearing regular basis.  Proper skin and self care.  Importance of no scratching.    Person(s) Educated Patient    Methods Explanation    Comprehension Verbalized understanding            PT Short Term Goals - 06/09/20 1358      PT SHORT TERM GOAL #1   Title Pt to have 5 or less wounds on each leg and LT LE to no longer be weeping to decrease risk of infection    Baseline 06/09/20:  2 lateral wound on Lt LE, none on Rt.  No skilled intervention required with wound and no weeping.    Status Achieved      PT SHORT TERM GOAL #2   Title PT to verbalize that she is not to scratch her legs under any circumstance as this can cause wounds and infection.    Baseline 012/21/21:  Reviewed importance of not scratching les to reduce cause of infection and wound.    Status Achieved      PT SHORT TERM GOAL #3   Title PT to have been measured for her compression garment , pt already states she is going to obtain garments from elastic therapy.    Status Achieved             PT Long Term Goals - 06/09/20 1359      PT LONG TERM GOAL #1   Title PT to no longer have any wounds on her either LE    Baseline 06/09/20:  2 lateral wound on Lt LE, none on Rt.  No skilled intervention required with wound and no weeping.    Status Partially Met      PT LONG TERM GOAL #2    Title PT to have and be donning compression garment to prevent edema in LE    Baseline 06/09/20:  Reviewed donning technique, pt able to complete independently without assistance.    Status Achieved      PT LONG TERM GOAL #3   Title PT to have obtained and be using compression pump to prevent edema and cellulitis    Status Achieved                  Patient will benefit from skilled therapeutic intervention in order to improve the following deficits and impairments:     Visit Diagnosis: Localized edema  Leg wound, left, sequela  Leg wound, right, sequela     Problem List Patient Active Problem List   Diagnosis Date Noted  . Acute lower UTI 06/22/2019  . Hypokalemia 06/20/2019  . AKI (acute kidney injury) ([place]) 06/01/2019  . NASH (nonalcoholic steatohepatitis) 06/01/2019  . Pressure injury of skin 05/31/2019  . Candidiasis, intertrigo 05/31/2019  . Yeast infection 05/31/2019  . Fever 12/24/2018  . Hyponatremia 12/24/2018  . Abnormal liver function 12/24/2018  . Sepsis due to undetermined organism ([place]) 11/07/2018  . Atrial fibrillation ([place]) 11/07/2018  . COPD (chronic obstructive pulmonary disease) ([place]) 11/07/2018  . GERD (gastroesophageal reflux disease) 11/07/2018  . Hypothyroidism 11/07/2018  . Sleep apnea 11/07/2018  . Hepatic cirrhosis ([place]) 09/01/2014  . Cirrhosis, nonalcoholic ([place]) 54/62/7035  . Incisional hernia 06/09/2014  . S/P complete repair of rotator cuff 07/24/2012  . Precordial pain 09/30/2010  . Essential hypertension, benign 09/30/2010  . Type 2 diabetes mellitus ([place]) 09/05/2008  . OBESITY-MORBID (>100') 09/05/2008  .  ESOPHAGEAL REFLUX 09/05/2008  . EDEMA 09/05/2008   Ihor Austin, LPTA/CLT; CBIS [phone]  Aldona Lento 06/09/2020, 4:58 PM  [place] [address] [place], [place], [address] Phone: [phone]   Fax:  [phone]  Name: [name] [name] MRN:  [id number] Date of Birth: 1956/03/04

## 2020-06-10 DIAGNOSIS — B372 Candidiasis of skin and nail: Secondary | ICD-10-CM | POA: Diagnosis not present

## 2020-06-10 DIAGNOSIS — E063 Autoimmune thyroiditis: Secondary | ICD-10-CM | POA: Diagnosis not present

## 2020-06-10 DIAGNOSIS — E669 Obesity, unspecified: Secondary | ICD-10-CM | POA: Diagnosis not present

## 2020-06-10 DIAGNOSIS — B999 Unspecified infectious disease: Secondary | ICD-10-CM | POA: Diagnosis not present

## 2020-06-10 NOTE — Progress Notes (Signed)
Cardiology Office Note  Date: 06/11/2020   ID: [name] [name], [name] [name] May 03, 1956, MRN [id number]  PCP:  Redmond School, MD  Cardiologist:  Rozann Lesches, MD Electrophysiologist:  None   Chief Complaint: Follow up persistent atrial fibrillation.  History of Present Illness: [name] [name] is a 64 y.o. female with a history of atrial fibrillation, HTN, COPD, CAD, GERD, morbid obesity, OSA on CPAP, hypothyroidism, DM2, Hx of thyroid CA. Cirrhosis, NAFLD.  Last encounter with Coletta Memos NP 05/08/2020: She had been seen in ER 04/14/2020. C/O dry mouth, decreased apetite, rash, failure to thrive. Upon returning home she was unable to exit get out of the cab. EMS was called. She was placed in rehab due to deconditioning. She stated her strength had improved at follow up visit. She had bilateral LE venous stasis versus dependent edema. She was prescribed Lasix and antibiotics by PCP at recent visit. She was to start compression stockings. She denied CP, SOB, fatigue. CHA2DS2-VASc = 5. She was continuing Eliquis, Cardizem, statin.  She is here for 1 month follow-up.  States she has been doing much better.  She has been undergoing outpatient rehab and states she is able to perform most of her ADLs at home and able to cook her meals.  She is using a walker to ambulate today.  States her lower extremity has improved.  She is wearing compression stockings today.  She denies any dyspnea on exertion.  She states the previous provider had told her to take extra Lasix for 7 days and then revert back to as needed.  She states she weighed 259 pounds when she was discharged from the hospital.  Today's weight is 263.  No significant edema noted in her ankles.  She is wearing her compression stockings.  Blood pressure is elevated on arrival today.  She states at home her blood pressure is usually within normal limits.  States she is always anxious when she comes to the doctor and blood pressure is usually higher  at doctor's office than at home.  She denies any significant palpitations or arrhythmias, orthostatic symptoms, CVA or TIA-like symptoms, PND, orthopnea.  States she has not been using her CPAP device.  Past Medical History:  Diagnosis Date  . Anemia   . Anxiety   . Arthritis   . Asthma   . Atrial fibrillation ([place])   . Cirrhosis of liver ([place])   . COPD (chronic obstructive pulmonary disease) ([place])   . Coronary artery disease   . Depression   . Essential hypertension   . GERD (gastroesophageal reflux disease)   . Hypothyroidism   . Morbid obesity ([place])   . Overactive bladder   . Sleep apnea    CPAP - not consistent  . Thyroid cancer ([place])   . Type 2 diabetes mellitus ([place])     Past Surgical History:  Procedure Laterality Date  . "pump bumps"     bilateral heels--2000  . ABDOMINAL HYSTERECTOMY  1985  . CARDIAC CATHETERIZATION  2012   Dr. Lia Foyer told her nothing was wrong.  [name] [name]  . CHOLECYSTECTOMY  1996  . COLONOSCOPY WITH PROPOFOL N/A 04/17/2015   Procedure: COLONOSCOPY WITH PROPOFOL  at cecum at 0810; withdrawal time =15 minutes;  Surgeon: [name], MD;  Location: [facility];  Service: Endoscopy;  Laterality: N/A;  . Debridement of abdominal wound  2011  . ESOPHAGEAL DILATION N/A 04/17/2015   Procedure: ESOPHAGEAL DILATION WITH 56FR MALONEY DILATOR;  Surgeon: Bernadene Person  Gloriann Loan, MD;  Location: [facility];  Service: Endoscopy;  Laterality: N/A;  . ESOPHAGOGASTRODUODENOSCOPY (EGD) WITH PROPOFOL N/A 04/17/2015   Procedure: ESOPHAGOGASTRODUODENOSCOPY (EGD) WITH PROPOFOL;  Surgeon: [name], MD;  Location: [facility];  Service: Endoscopy;  Laterality: N/A;  . EXPLORATORY LAPAROTOMY  2003  . FOOT SURGERY Bilateral 2000   Bone spur removed  . FOOT SURGERY    . INCISIONAL HERNIA REPAIR N/A 06/09/2014   Procedure: RECURRENT  INCISIONAL HERNIORRHAPHY WITH MESH;  Surgeon: [name], MD;  Location: [facility];  Service: General;  Laterality: N/A;  . INCISIONAL  HERNIA REPAIR N/A 10/15/2014   Procedure: [place];  Surgeon: [name], MD;  Location: [facility];  Service: General;  Laterality: N/A;  . INSERTION OF MESH N/A 06/09/2014   Procedure: INSERTION OF MESH;  Surgeon: [name], MD;  Location: [facility];  Service: General;  Laterality: N/A;  . INSERTION OF MESH N/A 10/15/2014   Procedure: INSERTION OF MESH;  Surgeon: [name], MD;  Location: [facility];  Service: General;  Laterality: N/A;  . POLYPECTOMY  04/17/2015   Procedure: POLYPECTOMY;  Surgeon: [name], MD;  Location: [facility];  Service: Endoscopy;;  . RESECTION DISTAL CLAVICAL  05/07/2012   Procedure: RESECTION DISTAL CLAVICAL;  Surgeon: [name], MD;  Location: [facility];  Service: Orthopedics;  Laterality: Right;  . SHOULDER OPEN ROTATOR CUFF REPAIR  05/07/2012   Procedure: ROTATOR CUFF REPAIR SHOULDER OPEN;  Surgeon: [name], MD;  Location: [facility];  Service: Orthopedics;  Laterality: Right;  . TEE WITHOUT CARDIOVERSION N/A 12/26/2018   Procedure: TRANSESOPHAGEAL ECHOCARDIOGRAM (TEE) WITH PROPOFOL;  Surgeon: Arnoldo Lenis, MD;  [facility];  Service: Endoscopy;  Laterality: N/A;  . THYROIDECTOMY  2009  . TONSILLECTOMY  1958  . UMBILICAL HERNIA REPAIR  2010    Current Outpatient Medications  Medication Sig Dispense Refill  . acetaminophen (TYLENOL) 325 MG tablet Take 2 tablets (650 mg total) by mouth every 6 (six) hours as needed for mild pain (or Fever >/= 101). 12 tablet 0  . albuterol (VENTOLIN HFA) 108 (90 Base) MCG/ACT inhaler Inhale 2 puffs into the lungs every 6 (six) hours as needed for wheezing or shortness of breath. Shortness of breath    . amLODipine (NORVASC) 10 MG tablet Take 10 mg by mouth daily.     [name] Kitchen apixaban (ELIQUIS) 5 MG TABS tablet Take 1 tablet (5 mg total) by mouth 2 (two) times daily. 180 tablet 3  . celecoxib (CELEBREX) 200 MG capsule Take 200-400 mg by mouth See admin instructions. 1  tablet daily, may take an extra if needed    . colesevelam (WELCHOL) 625 MG tablet Take 1,875 mg by mouth as directed. 3 tabs am 3 tabs pm    . diltiazem (TIAZAC) 360 MG 24 hr capsule TAKE ONE CAPSULE (360MG TOTAL) BY MOUTH DAILY (Patient taking differently: Take 360 mg by mouth daily.) 90 capsule 3  . DULoxetine (CYMBALTA) 60 MG capsule Take 120 mg by mouth daily.   4  . econazole nitrate 1 % cream Apply topically daily. (Patient taking differently: Apply 1 application topically daily.) 85 g 0  . fluorometholone (FML) 0.1 % ophthalmic suspension Place 1 drop into both eyes 3 (three) times daily.     . furosemide (LASIX) 40 MG tablet Take 40 mg by mouth daily as needed for fluid.     [name] Kitchen gabapentin (NEURONTIN) 300 MG capsule Take 300 mg by  mouth daily.    [name] Kitchen HYDROcodone-acetaminophen (NORCO/VICODIN) 5-325 MG tablet Take 1 tablet by mouth every 6 (six) hours as needed for moderate pain or severe pain. 12 tablet 0  . hydrOXYzine (ATARAX/VISTARIL) 25 MG tablet Take 25 mg by mouth 4 (four) times daily as needed for itching.     . insulin aspart (NOVOLOG) 100 UNIT/ML injection insulin aspart (novoLOG) injection 0-9 Units 0-9 Units Subcutaneous, 3 times daily with meals CBG < 70: Implement Hypoglycemia Standing Orders and refer to Hypoglycemia Standing Orders sidebar report  CBG 70 - 120: 0 units  CBG 121 - 150: 0 unit CBG 151 - 200: 2 units CBG 201 - 250: 3 units CBG 251 - 300: 5 units CBG 301 - 350: 7 units  CBG 351 - 400: 9 units CBG > 400: 10 units 10 mL 3  . LANTUS SOLOSTAR 100 UNIT/ML Solostar Pen Inject 30 Units into the skin at bedtime.     [name] Kitchen levothyroxine (SYNTHROID) 300 MCG tablet Take 300 mcg by mouth daily.    . magnesium oxide (MAG-OX) 400 (241.3 Mg) MG tablet Take 1 tablet (400 mg total) by mouth daily. 30 tablet 0  . methocarbamol (ROBAXIN) 500 MG tablet Take 1 tablet (500 mg total) by mouth every 6 (six) hours as needed for muscle spasms. 20 tablet 0  . nystatin cream (MYCOSTATIN) Apply to  affected area 2 times daily 30 g 5  . potassium chloride SA (K-DUR,KLOR-CON) 20 MEQ tablet Take 20 mEq by mouth 2 (two) times daily.     . pravastatin (PRAVACHOL) 20 MG tablet Take 20 mg by mouth daily.    [name] Kitchen senna-docusate (SENOKOT-S) 8.6-50 MG tablet Take 2 tablets by mouth at bedtime. 60 tablet 1  . traZODone (DESYREL) 150 MG tablet Take 1 tablet (150 mg total) by mouth at bedtime. (Patient taking differently: Take 150 mg by mouth at bedtime as needed for sleep.) 30 tablet 3  . Vitamin D, Ergocalciferol, (DRISDOL) 1.25 MG (50000 UNIT) CAPS capsule Take 50,000 Units by mouth once a week.     No current facility-administered medications for this visit.   Allergies:  Doxycycline, Adhesive [tape], Biaxin [clarithromycin], Percocet [oxycodone-acetaminophen], Xarelto [rivaroxaban], Clarithromycin, Clindamycin/lincomycin, Neosporin [neomycin-polymyxin b gu], and Penicillins   Social History: The patient  reports that she has never smoked. She has never used smokeless tobacco. She reports that she does not drink alcohol and does not use drugs.   Family History: The patient's family history includes Arthritis in an other family member; Asthma in an other family member; Cancer in an other family member; Diabetes in an other family member; Heart disease in an other family member; Kidney disease in an other family member.   ROS:  Please see the history of present illness. Otherwise, complete review of systems is positive for none.  All other systems are reviewed and negative.   Physical Exam: VS:  BP (!) 140/56   Pulse 68   Ht 5' 4"  (1.626 m)   Wt 263 lb 9.6 oz (119.6 kg)   SpO2 91%   BMI 45.25 kg/m , BMI Body mass index is 45.25 kg/m.  Wt Readings from Last 3 Encounters:  06/11/20 263 lb 9.6 oz (119.6 kg)  05/08/20 253 lb (114.8 kg)  04/12/20 260 lb (117.9 kg)    General: Morbidly obese patient appears comfortable at rest. Neck: Supple, no elevated JVP or carotid bruits, no  thyromegaly. Lungs: Clear to auscultation, nonlabored breathing at rest. Cardiac: Irregularly irregular rate and rhythm, no S3  or significant systolic murmur, no pericardial rub. Extremities: No pitting edema, wearing compression stockings distal pulses 2+. Skin: Warm and dry. Musculoskeletal: No kyphosis. Neuropsychiatric: Alert and oriented x3, affect grossly appropriate.  ECG:  An ECG dated 06/11/2020 was personally reviewed today and demonstrated:  Atrial fibrillation with a rate of 80  Recent Labwork: 04/12/2020: ALT 16; AST 23; BUN 9; Creatinine, Ser 0.72; Hemoglobin 9.0; Magnesium 1.7; Platelets 209; Potassium 3.1; Sodium 136; TSH 4.137  No results found for: CHOL, TRIG, HDL, CHOLHDL, VLDL, LDLCALC, LDLDIRECT  Other Studies Reviewed Today:  Echocardiogram 12/26/2018 IMPRESSIONS  1. The left ventricle has normal systolic function, with an ejection  fraction of 55-60%. The cavity size was normal.  2. Left atrial size was LA is enlarged.  3. Normal LA appendage without thrombus, normal emptying velocities.  4. No evidence of mitral valve stenosis.  5. The aortic valve is tricuspid No stenosis of the aortic valve.  6. The aortic root is normal in size and structure.  7. The interatrial septum appears to be lipomatous.  8. No evidence of vegetations   Assessment and Plan:  1. Persistent atrial fibrillation ([place])   2. Mixed hyperlipidemia   3. Essential hypertension   4. Venous stasis ulcers of both lower extremities (HCC)    1. Persistent atrial fibrillation (HCC) Eyes any significant palpitations or arrhythmias.  EKG shows atrial fibrillation with a controlled rate of 80 today.  Continue Eliquis 5 mg p.o. twice daily.  Continue diltiazem CD 360 mg p.o. daily.  2. Mixed hyperlipidemia Continue pravastatin 20 mg p.o. daily.  3. Essential hypertension Blood pressure elevated today at 140/56.  Patient states blood pressure at home is usually well controlled.  Continue  diltiazem CD 360 mg daily.  4. Venous stasis ulcers of both lower extremities (HCC) lower extremity edema Had recently been treated for venous stasis ulcers.  Patient states the ulcer/weeping has resolved.  She is now wearing compression stockings for lower extremity edema.  Continue Lasix as needed for lower extremity edema.  Medication Adjustments/Labs and Tests Ordered: Current medicines are reviewed at length with the patient today.  Concerns regarding medicines are outlined above.   Disposition: Follow-up with Dr. Domenic Polite or APP 1 year   signed, Levell July, NP 06/11/2020 10:50 AM    [facility] [place], [place],  [address] Phone: [phone]; Fax: [phone]

## 2020-06-11 ENCOUNTER — Encounter: Payer: Self-pay | Admitting: Family Medicine

## 2020-06-11 ENCOUNTER — Ambulatory Visit: Payer: PPO | Admitting: Family Medicine

## 2020-06-11 VITALS — BP 140/56 | HR 68 | Ht 64.0 in | Wt 263.6 lb

## 2020-06-11 DIAGNOSIS — L97929 Non-pressure chronic ulcer of unspecified part of left lower leg with unspecified severity: Secondary | ICD-10-CM | POA: Diagnosis not present

## 2020-06-11 DIAGNOSIS — L97919 Non-pressure chronic ulcer of unspecified part of right lower leg with unspecified severity: Secondary | ICD-10-CM

## 2020-06-11 DIAGNOSIS — I4819 Other persistent atrial fibrillation: Secondary | ICD-10-CM

## 2020-06-11 DIAGNOSIS — I10 Essential (primary) hypertension: Secondary | ICD-10-CM

## 2020-06-11 DIAGNOSIS — I83019 Varicose veins of right lower extremity with ulcer of unspecified site: Secondary | ICD-10-CM

## 2020-06-11 DIAGNOSIS — I83029 Varicose veins of left lower extremity with ulcer of unspecified site: Secondary | ICD-10-CM | POA: Diagnosis not present

## 2020-06-11 DIAGNOSIS — E782 Mixed hyperlipidemia: Secondary | ICD-10-CM

## 2020-06-11 NOTE — Patient Instructions (Signed)
Medication Instructions:   Your physician recommends that you continue on your current medications as directed. Please refer to the Current Medication list given to you today.  Labwork:  None  Testing/Procedures:  None  Follow-Up:  Your physician recommends that you schedule a follow-up appointment in: 1 year in [place]. You will receive a reminder letter in the mail in about 10 months reminding you to call and schedule your appointment. If you don't receive this letter, please contact our office.  Any Other Special Instructions Will Be Listed Below (If Applicable).  If you need a refill on your cardiac medications before your next appointment, please call your pharmacy.

## 2020-06-14 ENCOUNTER — Emergency Department (HOSPITAL_COMMUNITY)
Admission: EM | Admit: 2020-06-14 | Discharge: 2020-06-14 | Disposition: A | Discharge status: Home / Self Care | Payer: PPO | Attending: Emergency Medicine | Admitting: Emergency Medicine

## 2020-06-14 ENCOUNTER — Encounter (HOSPITAL_COMMUNITY): Payer: Self-pay

## 2020-06-14 ENCOUNTER — Other Ambulatory Visit: Payer: Self-pay

## 2020-06-14 ENCOUNTER — Emergency Department (HOSPITAL_COMMUNITY): Payer: PPO

## 2020-06-14 DIAGNOSIS — M85871 Other specified disorders of bone density and structure, right ankle and foot: Secondary | ICD-10-CM | POA: Diagnosis not present

## 2020-06-14 DIAGNOSIS — M79671 Pain in right foot: Secondary | ICD-10-CM | POA: Diagnosis not present

## 2020-06-14 DIAGNOSIS — I4891 Unspecified atrial fibrillation: Secondary | ICD-10-CM | POA: Insufficient documentation

## 2020-06-14 DIAGNOSIS — E119 Type 2 diabetes mellitus without complications: Secondary | ICD-10-CM | POA: Diagnosis not present

## 2020-06-14 DIAGNOSIS — I2510 Atherosclerotic heart disease of native coronary artery without angina pectoris: Secondary | ICD-10-CM | POA: Insufficient documentation

## 2020-06-14 DIAGNOSIS — J449 Chronic obstructive pulmonary disease, unspecified: Secondary | ICD-10-CM | POA: Insufficient documentation

## 2020-06-14 DIAGNOSIS — E039 Hypothyroidism, unspecified: Secondary | ICD-10-CM | POA: Insufficient documentation

## 2020-06-14 DIAGNOSIS — R609 Edema, unspecified: Secondary | ICD-10-CM | POA: Diagnosis not present

## 2020-06-14 DIAGNOSIS — R509 Fever, unspecified: Secondary | ICD-10-CM | POA: Diagnosis not present

## 2020-06-14 DIAGNOSIS — M19071 Primary osteoarthritis, right ankle and foot: Secondary | ICD-10-CM | POA: Diagnosis not present

## 2020-06-14 DIAGNOSIS — M79604 Pain in right leg: Secondary | ICD-10-CM | POA: Diagnosis not present

## 2020-06-14 DIAGNOSIS — M7731 Calcaneal spur, right foot: Secondary | ICD-10-CM | POA: Diagnosis not present

## 2020-06-14 MED ORDER — OXYCODONE HCL 5 MG PO TABS
5.0000 mg | ORAL_TABLET | ORAL | 0 refills | Status: AC | PRN
Start: 2020-06-14 — End: ?

## 2020-06-14 NOTE — Discharge Instructions (Signed)
You were evaluated in the Emergency Department and after careful evaluation, we did not find any emergent condition requiring admission or further testing in the hospital.  Your exam/testing today was overall reassuring.  Your pain seems to be due to a heel spur.  Your ultrasound here in the emergency department did not show any signs of blood clots.  Please return to the Emergency Department if you experience any worsening of your condition.  Thank you for allowing [place] to be a part of your care.

## 2020-06-14 NOTE — ED Provider Notes (Signed)
[facility] Emergency Department Provider Note MRN:  [id number]  Arrival date & time: 06/14/20     Chief Complaint   Leg Pain   History of Present Illness   [name] [name] is a 64 y.o. year-old female with a history of A. fib, cirrhosis, COPD, CAD, obesity, diabetes presenting to the ED with chief complaint of leg pain.  Pain to the right leg for the past day, pain worst in the heel.  Has a history of heel spurs but this pain radiated up to her leg and growing today and she was told to be evaluated for blood clots.  Denies chest pain or shortness of breath, no fever, is currently taking antibiotics for cellulitis.  Review of Systems  A complete 10 system review of systems was obtained and all systems are negative except as noted in the HPI and PMH.   Patient's Health History    Past Medical History:  Diagnosis Date  . Anemia   . Anxiety   . Arthritis   . Asthma   . Atrial fibrillation ([place])   . Cirrhosis of liver ([place])   . COPD (chronic obstructive pulmonary disease) ([place])   . Coronary artery disease   . Depression   . Essential hypertension   . GERD (gastroesophageal reflux disease)   . Hypothyroidism   . Morbid obesity ([place])   . Overactive bladder   . Sleep apnea    CPAP - not consistent  . Thyroid cancer ([place])   . Type 2 diabetes mellitus ([place])     Past Surgical History:  Procedure Laterality Date  . "pump bumps"     bilateral heels--2000  . ABDOMINAL HYSTERECTOMY  1985  . CARDIAC CATHETERIZATION  2012   Dr. Lia Foyer told her nothing was wrong.  [name] [place]  . CHOLECYSTECTOMY  1996  . COLONOSCOPY WITH PROPOFOL N/A 04/17/2015   Procedure: COLONOSCOPY WITH PROPOFOL  at cecum at 0810; withdrawal time =15 minutes;  Surgeon: [name], MD;  [facility]: [facility];  Service: Endoscopy;  Laterality: N/A;  . Debridement of abdominal wound  2011  . ESOPHAGEAL DILATION N/A 04/17/2015   Procedure: ESOPHAGEAL DILATION WITH 56FR  MALONEY DILATOR;  Surgeon: [name], MD;  Location: [facility];  Service: Endoscopy;  Laterality: N/A;  . ESOPHAGOGASTRODUODENOSCOPY (EGD) WITH PROPOFOL N/A 04/17/2015   Procedure: ESOPHAGOGASTRODUODENOSCOPY (EGD) WITH PROPOFOL;  Surgeon: [name], MD;  Location: [facility];  Service: Endoscopy;  Laterality: N/A;  . EXPLORATORY LAPAROTOMY  2003  . FOOT SURGERY Bilateral 2000   Bone spur removed  . FOOT SURGERY    . INCISIONAL HERNIA REPAIR N/A 06/09/2014   Procedure: RECURRENT  INCISIONAL HERNIORRHAPHY WITH MESH;  Surgeon: [name], MD;  Location: [facility];  Service: General;  Laterality: N/A;  . INCISIONAL HERNIA REPAIR N/A 10/15/2014   Procedure: [place];  Surgeon: [name], MD;  Location: [place];  Service: General;  Laterality: N/A;  . INSERTION OF MESH N/A 06/09/2014   Procedure: INSERTION OF MESH;  Surgeon: [name], MD;  [facility]: [facility];  Service: General;  Laterality: N/A;  . INSERTION OF MESH N/A 10/15/2014   Procedure: INSERTION OF MESH;  Surgeon: [name], MD;  Location: [place];  Service: General;  Laterality: N/A;  . POLYPECTOMY  04/17/2015   Procedure: POLYPECTOMY;  Surgeon: [name], MD;  [facility];  Service: Endoscopy;;  . RESECTION DISTAL CLAVICAL  05/07/2012   Procedure: RESECTION  DISTAL CLAVICAL;  Surgeon: [name], MD;  [facility];  Service: Orthopedics;  Laterality: Right;  . SHOULDER OPEN ROTATOR CUFF REPAIR  05/07/2012   Procedure: ROTATOR CUFF REPAIR SHOULDER OPEN;  Surgeon: [name], MD;  [facility];  Service: Orthopedics;  Laterality: Right;  . TEE WITHOUT CARDIOVERSION N/A 12/26/2018   Procedure: TRANSESOPHAGEAL ECHOCARDIOGRAM (TEE) WITH PROPOFOL;  Surgeon: Arnoldo Lenis, MD;  [facility];  Service: Endoscopy;  Laterality: N/A;  . THYROIDECTOMY  2009  . TONSILLECTOMY  1958  . UMBILICAL HERNIA REPAIR  2010    Family History  Problem Relation Age  of Onset  . Heart disease Other   . Arthritis Other   . Cancer Other   . Asthma Other   . Diabetes Other   . Kidney disease Other     Social History   Socioeconomic History  . Marital status: Widowed    Spouse name: Not on file  . Number of children: Not on file  . Years of education: 47  . Highest education level: Not on file  Occupational History  . Occupation: Programme researcher, broadcasting/film/video: [employer]    Comment: [place] for daycare  Tobacco Use  . Smoking status: Never Smoker  . Smokeless tobacco: Never Used  Vaping Use  . Vaping Use: Never used  Substance and Sexual Activity  . Alcohol use: No    Alcohol/week: 0.0 standard drinks  . Drug use: No  . Sexual activity: Yes    Birth control/protection: Surgical  Other Topics Concern  . Not on file  Social History Narrative   Pt reports that her granddaughter has  Moved in with her,    Social Determinants of Health   Financial Resource Strain: Not on file  Food Insecurity: Not on file  Transportation Needs: Not on file  Physical Activity: Not on file  Stress: Not on file  Social Connections: Not on file  Intimate Partner Violence: Not on file     Physical Exam   Vitals:   06/14/20 2000  BP: 118/82  Pulse: 86  Resp: 18  Temp: 97.9 F (36.6 C)  SpO2: 98%    CONSTITUTIONAL: Chronically ill-appearing, NAD NEURO:  Alert and oriented x 3, no focal deficits EYES:  eyes equal and reactive ENT/NECK:  no LAD, no JVD CARDIO: Regular rate, well-perfused, normal S1 and S2 PULM:  CTAB no wheezing or rhonchi GI/GU:  normal bowel sounds, non-distended, non-tender MSK/SPINE:  No gross deformities, no edema SKIN: Bilateral lower extremity erythema and edema, slightly more erythematous to the right leg.  Right heel is tender to palpation. PSYCH:  Appropriate speech and behavior  *Additional and/or pertinent findings included in MDM below  Diagnostic and Interventional Summary    EKG  Interpretation  Date/Time:    Ventricular Rate:    PR Interval:    QRS Duration:   QT Interval:    QTC Calculation:   R Axis:     Text Interpretation:        Labs Reviewed - No data to display  DG Foot Complete Right  Final Result      Medications - No data to display   Procedures  /  Critical Care Ultrasound ED DVT  Date/Time: 06/14/2020 9:40 PM Performed by: Maudie Flakes, MD Authorized by: Maudie Flakes, MD   Procedure details:    Indications: lower extremity pain     Assessment for:  DVT   Images Archived:  Yes   RLE Findings:    Right common femoral vein:  Compressible   Right popliteal vein:  Compressible IMPRESSION:   DVT:     None    ED Course and Medical Decision Making  I have reviewed the triage vital signs, the nursing notes, and pertinent available records from the EMR.  Listed above are laboratory and imaging tests that I personally ordered, reviewed, and interpreted and then considered in my medical decision making (see below for details).  Suspect the erythema to the legs is related to the chronic swelling.  There may be a component of cellulitis in the right leg, patient started antibiotics yesterday.  No fever, no systemic symptoms.  Low concern for DVT.  Bedside ultrasound does not show any evidence of such.  Will x-ray to evaluate for bony abnormalities.  Suspect recurrence of heel spur related pain.  Anticipating discharge.       Barth Kirks. Sedonia Small, [place] [email or web address]  Final Clinical Impressions(s) / ED Diagnoses     ICD-10-CM   1. Pain of right heel  M79.671     ED Discharge Orders         Ordered    oxyCODONE (ROXICODONE) 5 MG immediate release tablet  Every 4 hours PRN        06/14/20 2213           Discharge Instructions Discussed with and Provided to Patient:     Discharge Instructions     You were evaluated in the Emergency Department and after careful  evaluation, we did not find any emergent condition requiring admission or further testing in the hospital.  Your exam/testing today was overall reassuring.  Your pain seems to be due to a heel spur.  Your ultrasound here in the emergency department did not show any signs of blood clots.  Please return to the Emergency Department if you experience any worsening of your condition.  Thank you for allowing [place] to be a part of your care.        Maudie Flakes, MD 06/14/20 2213

## 2020-06-14 NOTE — ED Triage Notes (Signed)
Pt to er via ems, pt states that she has a hx of heel spurs, states that yesterday she started having some R foot pain and then today her pain has traveled up into her leg and groin.  States that she talked with her visiting RN and was told to come in to get eval for a blood clot.

## 2020-06-15 ENCOUNTER — Ambulatory Visit (HOSPITAL_COMMUNITY): Payer: PPO | Admitting: Physical Therapy

## 2020-06-16 ENCOUNTER — Other Ambulatory Visit: Payer: Self-pay

## 2020-06-16 ENCOUNTER — Emergency Department (HOSPITAL_COMMUNITY): Payer: PPO

## 2020-06-16 ENCOUNTER — Emergency Department (HOSPITAL_COMMUNITY)
Admission: EM | Admit: 2020-06-16 | Discharge: 2020-06-17 | Disposition: A | Discharge status: Home / Self Care | Payer: PPO | Attending: Emergency Medicine | Admitting: Emergency Medicine

## 2020-06-16 ENCOUNTER — Ambulatory Visit (HOSPITAL_COMMUNITY): Payer: PPO | Admitting: Physical Therapy

## 2020-06-16 ENCOUNTER — Encounter (HOSPITAL_COMMUNITY): Payer: Self-pay | Admitting: Emergency Medicine

## 2020-06-16 DIAGNOSIS — I2510 Atherosclerotic heart disease of native coronary artery without angina pectoris: Secondary | ICD-10-CM | POA: Diagnosis not present

## 2020-06-16 DIAGNOSIS — I4891 Unspecified atrial fibrillation: Secondary | ICD-10-CM | POA: Diagnosis not present

## 2020-06-16 DIAGNOSIS — R0902 Hypoxemia: Secondary | ICD-10-CM | POA: Diagnosis not present

## 2020-06-16 DIAGNOSIS — J449 Chronic obstructive pulmonary disease, unspecified: Secondary | ICD-10-CM | POA: Diagnosis not present

## 2020-06-16 DIAGNOSIS — Z743 Need for continuous supervision: Secondary | ICD-10-CM | POA: Diagnosis not present

## 2020-06-16 DIAGNOSIS — R6889 Other general symptoms and signs: Secondary | ICD-10-CM | POA: Diagnosis not present

## 2020-06-16 DIAGNOSIS — E039 Hypothyroidism, unspecified: Secondary | ICD-10-CM | POA: Insufficient documentation

## 2020-06-16 DIAGNOSIS — Z7901 Long term (current) use of anticoagulants: Secondary | ICD-10-CM | POA: Diagnosis not present

## 2020-06-16 DIAGNOSIS — E119 Type 2 diabetes mellitus without complications: Secondary | ICD-10-CM | POA: Diagnosis not present

## 2020-06-16 DIAGNOSIS — Z794 Long term (current) use of insulin: Secondary | ICD-10-CM | POA: Diagnosis not present

## 2020-06-16 DIAGNOSIS — I10 Essential (primary) hypertension: Secondary | ICD-10-CM | POA: Diagnosis not present

## 2020-06-16 DIAGNOSIS — M25561 Pain in right knee: Secondary | ICD-10-CM | POA: Diagnosis not present

## 2020-06-16 DIAGNOSIS — I119 Hypertensive heart disease without heart failure: Secondary | ICD-10-CM | POA: Insufficient documentation

## 2020-06-16 DIAGNOSIS — M25461 Effusion, right knee: Secondary | ICD-10-CM | POA: Diagnosis not present

## 2020-06-16 DIAGNOSIS — Z79899 Other long term (current) drug therapy: Secondary | ICD-10-CM | POA: Insufficient documentation

## 2020-06-16 DIAGNOSIS — E1165 Type 2 diabetes mellitus with hyperglycemia: Secondary | ICD-10-CM | POA: Diagnosis not present

## 2020-06-16 DIAGNOSIS — J45909 Unspecified asthma, uncomplicated: Secondary | ICD-10-CM | POA: Diagnosis not present

## 2020-06-16 DIAGNOSIS — M1711 Unilateral primary osteoarthritis, right knee: Secondary | ICD-10-CM | POA: Diagnosis not present

## 2020-06-16 MED ORDER — TRAMADOL HCL 50 MG PO TABS
50.0000 mg | ORAL_TABLET | Freq: Once | ORAL | Status: AC
  Administered 2020-06-16: 23:00:00 50 mg via ORAL
  Filled 2020-06-16: qty 1

## 2020-06-16 NOTE — ED Triage Notes (Signed)
Pt to ED [facility] c/o rt knee pain.   Pt states she has arthritis and a past fracture of her rt knee.

## 2020-06-16 NOTE — Discharge Instructions (Addendum)
The x-ray of your knee this evening did not show evidence of a dislocation or fracture.  Elevate your knee and apply ice packs on and off.  Wear your knee brace when standing or walking for added support.  Call your orthopedic provider listed to arrange a follow-up appointment.  Continue Tylenol, 2 tablets (650 mg) every 6-8 hours as needed for pain.

## 2020-06-17 ENCOUNTER — Ambulatory Visit (HOSPITAL_COMMUNITY): Payer: PPO | Admitting: Physical Therapy

## 2020-06-17 DIAGNOSIS — I959 Hypotension, unspecified: Secondary | ICD-10-CM | POA: Diagnosis not present

## 2020-06-17 DIAGNOSIS — I10 Essential (primary) hypertension: Secondary | ICD-10-CM | POA: Diagnosis not present

## 2020-06-17 NOTE — ED Notes (Signed)
EMS arrived to transport pt home.

## 2020-06-18 ENCOUNTER — Ambulatory Visit (HOSPITAL_COMMUNITY): Payer: PPO | Admitting: Physical Therapy

## 2020-06-18 NOTE — ED Provider Notes (Signed)
[facility] EMERGENCY DEPARTMENT Provider Note   CSN: [id number] Arrival date & time: 06/16/20  1503     History Chief Complaint  Patient presents with  . Knee Pain    [name] [name] is a 64 y.o. female.  HPI     [name] [name] is a 64 y.o. female who presents to the Emergency Department complaining of right knee pain.  Describes aching pain to her right knee.  Has history of right knee pain secondary to old injury to the knee and arthritis of the knee.  [place] several days ago and since then pain has increased. Denies other injuries. She denies swelling, or redness.  No fever or chills.     Past Medical History:  Diagnosis Date  . Anemia   . Anxiety   . Arthritis   . Asthma   . Atrial fibrillation ([place])   . Cirrhosis of liver ([place])   . COPD (chronic obstructive pulmonary disease) ([place])   . Coronary artery disease   . Depression   . Essential hypertension   . GERD (gastroesophageal reflux disease)   . Hypothyroidism   . Morbid obesity ([place])   . Overactive bladder   . Sleep apnea    CPAP - not consistent  . Thyroid cancer ([place])   . Type 2 diabetes mellitus [facility])     Patient Active Problem List   Diagnosis Date Noted  . Acute lower UTI 06/22/2019  . Hypokalemia 06/20/2019  . AKI (acute kidney injury) ([place]) 06/01/2019  . NASH (nonalcoholic steatohepatitis) 06/01/2019  . Pressure injury of skin 05/31/2019  . Candidiasis, intertrigo 05/31/2019  . Yeast infection 05/31/2019  . Fever 12/24/2018  . Hyponatremia 12/24/2018  . Abnormal liver function 12/24/2018  . Sepsis due to undetermined organism ([place]) 11/07/2018  . Atrial fibrillation ([place]) 11/07/2018  . COPD (chronic obstructive pulmonary disease) ([place]) 11/07/2018  . GERD (gastroesophageal reflux disease) 11/07/2018  . Hypothyroidism 11/07/2018  . Sleep apnea 11/07/2018  . Hepatic cirrhosis ([place]) 09/01/2014  . Cirrhosis, nonalcoholic ([place]) 94/80/1655  . Incisional hernia 06/09/2014  . S/P complete repair  of rotator cuff 07/24/2012  . Precordial pain 09/30/2010  . Essential hypertension, benign 09/30/2010  . Type 2 diabetes mellitus ([place]) 09/05/2008  . OBESITY-MORBID (>100') 09/05/2008  . ESOPHAGEAL REFLUX 09/05/2008  . EDEMA 09/05/2008    Past Surgical History:  Procedure Laterality Date  . "pump bumps"     bilateral heels--2000  . ABDOMINAL HYSTERECTOMY  1985  . CARDIAC CATHETERIZATION  2012   Dr. Lia Foyer told her nothing was wrong.  [name] [facility]  . CHOLECYSTECTOMY  1996  . COLONOSCOPY WITH PROPOFOL N/A 04/17/2015   Procedure: COLONOSCOPY WITH PROPOFOL  at cecum at 0810; withdrawal time =15 minutes;  Surgeon: [name], MD;  Location: [facility];  Service: Endoscopy;  Laterality: N/A;  . Debridement of abdominal wound  2011  . ESOPHAGEAL DILATION N/A 04/17/2015   Procedure: ESOPHAGEAL DILATION WITH 56FR MALONEY DILATOR;  Surgeon: [name], MD;  Location: [facility];  Service: Endoscopy;  Laterality: N/A;  . ESOPHAGOGASTRODUODENOSCOPY (EGD) WITH PROPOFOL N/A 04/17/2015   Procedure: ESOPHAGOGASTRODUODENOSCOPY (EGD) WITH PROPOFOL;  Surgeon: [name], MD;  [facility]: [facility];  Service: Endoscopy;  Laterality: N/A;  . EXPLORATORY LAPAROTOMY  2003  . FOOT SURGERY Bilateral 2000   Bone spur removed  . FOOT SURGERY    . INCISIONAL HERNIA REPAIR N/A 06/09/2014   Procedure: RECURRENT  INCISIONAL HERNIORRHAPHY WITH MESH;  Surgeon: [name],  MD;  Location: [facility];  Service: General;  Laterality: N/A;  . INCISIONAL HERNIA REPAIR N/A 10/15/2014   Procedure: [place];  Surgeon: [name], MD;  Location: [place];  Service: General;  Laterality: N/A;  . INSERTION OF MESH N/A 06/09/2014   Procedure: INSERTION OF MESH;  Surgeon: [name], MD;  [facility]: [facility];  Service: General;  Laterality: N/A;  . INSERTION OF MESH N/A 10/15/2014   Procedure: INSERTION OF MESH;  Surgeon: [name], MD;  Location: [place];  Service: General;   Laterality: N/A;  . POLYPECTOMY  04/17/2015   Procedure: POLYPECTOMY;  Surgeon: [name], MD;  [facility]: [facility];  Service: Endoscopy;;  . RESECTION DISTAL CLAVICAL  05/07/2012   Procedure: RESECTION DISTAL CLAVICAL;  Surgeon: [name], MD;  [facility];  Service: Orthopedics;  Laterality: Right;  . SHOULDER OPEN ROTATOR CUFF REPAIR  05/07/2012   Procedure: ROTATOR CUFF REPAIR SHOULDER OPEN;  Surgeon: [name], MD;  [facility];  Service: Orthopedics;  Laterality: Right;  . TEE WITHOUT CARDIOVERSION N/A 12/26/2018   Procedure: TRANSESOPHAGEAL ECHOCARDIOGRAM (TEE) WITH PROPOFOL;  Surgeon: Arnoldo Lenis, MD;  [facility];  Service: Endoscopy;  Laterality: N/A;  . THYROIDECTOMY  2009  . TONSILLECTOMY  1958  . UMBILICAL HERNIA REPAIR  2010     OB History   No obstetric history on file.     Family History  Problem Relation Age of Onset  . Heart disease Other   . Arthritis Other   . Cancer Other   . Asthma Other   . Diabetes Other   . Kidney disease Other     Social History   Tobacco Use  . Smoking status: Never Smoker  . Smokeless tobacco: Never Used  Vaping Use  . Vaping Use: Never used  Substance Use Topics  . Alcohol use: No    Alcohol/week: 0.0 standard drinks  . Drug use: No    Home Medications Prior to Admission medications   Medication Sig Start Date End Date Taking? Authorizing Provider  acetaminophen (TYLENOL) 325 MG tablet Take 2 tablets (650 mg total) by mouth every 6 (six) hours as needed for mild pain (or Fever >/= 101). 06/26/19   Roxan Hockey, MD  albuterol (VENTOLIN HFA) 108 (90 Base) MCG/ACT inhaler Inhale 2 puffs into the lungs every 6 (six) hours as needed for wheezing or shortness of breath. Shortness of breath    [provider]  amLODipine (NORVASC) 10 MG tablet Take 10 mg by mouth daily.  03/20/20   [provider]  apixaban (ELIQUIS) 5 MG TABS tablet Take 1 tablet (5 mg total) by  mouth 2 (two) times daily. 05/25/20   Satira Sark, MD  celecoxib (CELEBREX) 200 MG capsule Take 200-400 mg by mouth See admin instructions. 1 tablet daily, may take an extra if needed 02/18/20   [provider]  colesevelam (WELCHOL) 625 MG tablet Take 1,875 mg by mouth as directed. 3 tabs am 3 tabs pm    [provider]  diltiazem (TIAZAC) 360 MG 24 hr capsule TAKE ONE CAPSULE (360MG TOTAL) BY MOUTH DAILY Patient taking differently: Take 360 mg by mouth daily. 05/20/19   Strader, Fransisco Hertz, PA-C  DULoxetine (CYMBALTA) 60 MG capsule Take 120 mg by mouth daily.  03/20/15   [provider]  econazole nitrate 1 % cream Apply topically daily. Patient taking differently: Apply 1 application topically daily. 12/09/15   Tuchman,  Richard C, DPM  fluorometholone (FML) 0.1 % ophthalmic suspension Place 1 drop into both eyes 3 (three) times daily.  11/22/19   [provider]  furosemide (LASIX) 40 MG tablet Take 40 mg by mouth daily as needed for fluid.  11/08/19   [provider]  gabapentin (NEURONTIN) 300 MG capsule Take 300 mg by mouth daily. 10/19/18   [provider]  HYDROcodone-acetaminophen (NORCO/VICODIN) 5-325 MG tablet Take 1 tablet by mouth every 6 (six) hours as needed for moderate pain or severe pain. 06/26/19   Roxan Hockey, MD  hydrOXYzine (ATARAX/VISTARIL) 25 MG tablet Take 25 mg by mouth 4 (four) times daily as needed for itching.  10/12/18   [provider]  insulin aspart (NOVOLOG) 100 UNIT/ML injection insulin aspart (novoLOG) injection 0-9 Units 0-9 Units Subcutaneous, 3 times daily with meals CBG < 70: Implement Hypoglycemia Standing Orders and refer to Hypoglycemia Standing Orders sidebar report  CBG 70 - 120: 0 units  CBG 121 - 150: 0 unit CBG 151 - 200: 2 units CBG 201 - 250: 3 units CBG 251 - 300: 5 units CBG 301 - 350: 7 units  CBG 351 - 400: 9 units CBG > 400: 10 units 06/26/19 06/25/20  Emokpae, Courage, MD  LANTUS SOLOSTAR  100 UNIT/ML Solostar Pen Inject 30 Units into the skin at bedtime.  09/21/18   [provider]  levothyroxine (SYNTHROID) 300 MCG tablet Take 300 mcg by mouth daily. 03/27/20   [provider]  magnesium oxide (MAG-OX) 400 (241.3 Mg) MG tablet Take 1 tablet (400 mg total) by mouth daily. 06/28/19   Lucky Cowboy, MD  methocarbamol (ROBAXIN) 500 MG tablet Take 1 tablet (500 mg total) by mouth every 6 (six) hours as needed for muscle spasms. 12/26/18   Manuella Ghazi, Pratik D, DO  nystatin cream (MYCOSTATIN) Apply to affected area 2 times daily 04/12/20   Noemi Chapel, MD  oxyCODONE (ROXICODONE) 5 MG immediate release tablet Take 1 tablet (5 mg total) by mouth every 4 (four) hours as needed for severe pain. 06/14/20   Maudie Flakes, MD  potassium chloride SA (K-DUR,KLOR-CON) 20 MEQ tablet Take 20 mEq by mouth 2 (two) times daily.  04/27/18   [provider]  pravastatin (PRAVACHOL) 20 MG tablet Take 20 mg by mouth daily. 10/30/18   [provider]  senna-docusate (SENOKOT-S) 8.6-50 MG tablet Take 2 tablets by mouth at bedtime. 06/26/19 06/25/20  Roxan Hockey, MD  traZODone (DESYREL) 150 MG tablet Take 1 tablet (150 mg total) by mouth at bedtime. Patient taking differently: Take 150 mg by mouth at bedtime as needed for sleep. 06/26/19   Roxan Hockey, MD  Vitamin D, Ergocalciferol, (DRISDOL) 1.25 MG (50000 UNIT) CAPS capsule Take 50,000 Units by mouth once a week. 12/11/19   [provider]    Allergies    Doxycycline, Adhesive [tape], Biaxin [clarithromycin], Percocet [oxycodone-acetaminophen], Xarelto [rivaroxaban], Clarithromycin, Clindamycin/lincomycin, Neosporin [neomycin-polymyxin b gu], and Penicillins  Review of Systems   Review of Systems  Constitutional: Negative for chills and fever.  Respiratory: Negative for shortness of breath.   Cardiovascular: Negative for chest pain.  Musculoskeletal: Positive for arthralgias (right knee pain). Negative for back  pain, joint swelling and neck pain.  Skin: Negative for color change and wound.  Neurological: Negative for dizziness, weakness and numbness.    Physical Exam Updated Vital Signs BP (!) 182/59 (BP Location: Left Arm)   Pulse 93   Temp 99.5 F (37.5 C) (Oral)   Resp  18   Ht 5' 5"  (1.651 m)   Wt 119.3 kg   SpO2 100%   BMI 43.77 kg/m   Physical Exam Vitals and nursing note reviewed.  Constitutional:      General: She is not in acute distress.    Appearance: Normal appearance. She is well-developed and well-nourished. She is not toxic-appearing.  HENT:     Head: Atraumatic.  Cardiovascular:     Rate and Rhythm: Normal rate and regular rhythm.     Pulses: Normal pulses and intact distal pulses.  Pulmonary:     Effort: Pulmonary effort is normal.     Breath sounds: Normal breath sounds.  Musculoskeletal:        General: Tenderness present. No swelling or signs of injury.     Comments: Diffuse tenderness to anterior right knee.  No erythema, excessive warmth or edema.  Pain with valgus and varus stress.  No posterior tenderness or edema  Skin:    General: Skin is warm.     Capillary Refill: Capillary refill takes less than 2 seconds.     Findings: No erythema.  Neurological:     General: No focal deficit present.     Mental Status: She is alert.     Sensory: No sensory deficit.     Motor: No weakness or abnormal muscle tone.     Coordination: Coordination normal.     ED Results / Procedures / Treatments   Labs (all labs ordered are listed, but only abnormal results are displayed) Labs Reviewed - No data to display  EKG None  Radiology DG Knee Complete 4 Views Right  Result Date: 06/16/2020 CLINICAL DATA:  Knee pain. EXAM: RIGHT KNEE - COMPLETE 4+ VIEW COMPARISON:  10/04/2017 FINDINGS: Negative for acute fracture. Advanced tricompartmental degenerative change with progression from 2019. Marked joint space narrowing and spurring most severe in the lateral joint  compartment. Progressive valgus deformity of the knee. Multiple calcified loose bodies in the knee joint have progressed in the interval. Moderate joint effusion. IMPRESSION: Progressive severe tricompartmental degenerative change with loose bodies and effusion. Negative for fracture. Electronically Signed   By: Franchot Gallo M.D.   On: 06/16/2020 16:15      Procedures Procedures (including critical care time)  Medications Ordered in ED Medications  traMADol (ULTRAM) tablet 50 mg (50 mg Oral Given 06/16/20 2304)    ED Course  I have reviewed the triage vital signs and the nursing notes.  Pertinent labs & imaging results that were available during my care of the patient were reviewed by me and considered in my medical decision making (see chart for details).  Clinical Course as of 06/18/20 2303  Tue Jun 16, 1020  [id number] 64 year old female here with right knee pain.  She said she fell a couple of days ago.  Also has a history of chronic right knee pain.  X-rays not showing any obvious fractures.  She is going to follow-up with her orthopedic specialist and has a brace. [MB]    Clinical Course User Index [MB] Hayden Rasmussen, MD   MDM Rules/Calculators/A&P                          Pt with likely acute on chronic right knee pain.  No concerning sx's for septic joint.  NV intact.   Xray neg for acute injury.   Pt has knee brace at home.  doubt emergent process. Appropriate for d/c home.  Agrees to  orthopedic f/u.    Final Clinical Impression(s) / ED Diagnoses Final diagnoses:  Acute pain of right knee    Rx / DC Orders ED Discharge Orders    None       Kem Parkinson, PA-C 06/18/20 2356    Hayden Rasmussen, MD 06/19/20 [phone]

## 2020-06-19 DIAGNOSIS — M170 Bilateral primary osteoarthritis of knee: Secondary | ICD-10-CM | POA: Diagnosis not present

## 2020-06-19 DIAGNOSIS — I872 Venous insufficiency (chronic) (peripheral): Secondary | ICD-10-CM | POA: Diagnosis not present

## 2020-06-19 DIAGNOSIS — E063 Autoimmune thyroiditis: Secondary | ICD-10-CM | POA: Diagnosis not present

## 2020-06-19 DIAGNOSIS — E1140 Type 2 diabetes mellitus with diabetic neuropathy, unspecified: Secondary | ICD-10-CM | POA: Diagnosis not present

## 2020-06-23 ENCOUNTER — Ambulatory Visit (HOSPITAL_COMMUNITY): Payer: PPO | Admitting: Physical Therapy

## 2020-06-25 ENCOUNTER — Ambulatory Visit (HOSPITAL_COMMUNITY): Payer: PPO | Admitting: Physical Therapy

## 2020-06-30 ENCOUNTER — Ambulatory Visit (HOSPITAL_COMMUNITY): Payer: PPO | Admitting: Physical Therapy

## 2020-07-02 ENCOUNTER — Ambulatory Visit (HOSPITAL_COMMUNITY): Payer: PPO | Admitting: Physical Therapy

## 2020-07-07 ENCOUNTER — Ambulatory Visit (HOSPITAL_COMMUNITY): Payer: PPO | Admitting: Physical Therapy

## 2020-07-09 ENCOUNTER — Ambulatory Visit (HOSPITAL_COMMUNITY): Payer: PPO | Admitting: Physical Therapy

## 2020-07-14 ENCOUNTER — Ambulatory Visit (HOSPITAL_COMMUNITY): Payer: PPO | Admitting: Physical Therapy

## 2020-07-16 ENCOUNTER — Ambulatory Visit (HOSPITAL_COMMUNITY): Payer: PPO | Admitting: Physical Therapy
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
## 2020-07-21 DEATH — deceased
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
